# Patient Record
Sex: Female | Born: 1980 | Race: Black or African American | Hispanic: No | Marital: Single | Smoking: Former smoker
Health system: Southern US, Community
[De-identification: ages and names within clinical notes are randomized; demographics above are authoritative.]

## PROBLEM LIST (undated history)

## (undated) ENCOUNTER — Emergency Department (HOSPITAL_COMMUNITY): Admission: EM | Payer: Medicaid Other | Source: Home / Self Care

## (undated) ENCOUNTER — Emergency Department (HOSPITAL_COMMUNITY): Admission: EM | Disposition: A | Discharge status: Home / Self Care | Payer: Medicaid Other

## (undated) DIAGNOSIS — Z9049 Acquired absence of other specified parts of digestive tract: Secondary | ICD-10-CM

## (undated) DIAGNOSIS — K859 Acute pancreatitis without necrosis or infection, unspecified: Secondary | ICD-10-CM

## (undated) DIAGNOSIS — D649 Anemia, unspecified: Secondary | ICD-10-CM

## (undated) DIAGNOSIS — G8929 Other chronic pain: Secondary | ICD-10-CM

## (undated) DIAGNOSIS — N83209 Unspecified ovarian cyst, unspecified side: Secondary | ICD-10-CM

## (undated) DIAGNOSIS — D219 Benign neoplasm of connective and other soft tissue, unspecified: Secondary | ICD-10-CM

## (undated) DIAGNOSIS — R109 Unspecified abdominal pain: Secondary | ICD-10-CM

## (undated) HISTORY — DX: Unspecified ovarian cyst, unspecified side: N83.209

## (undated) HISTORY — PX: CHOLECYSTECTOMY: SHX55

## (undated) HISTORY — PX: DILATION AND CURETTAGE OF UTERUS: SHX78

## (undated) HISTORY — PX: ERCP W/ METAL STENT PLACEMENT: SHX1521

## (undated) HISTORY — PX: OOPHORECTOMY: SHX86

---
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
## 2002-02-05 ENCOUNTER — Encounter: Admission: RE | Admit: 2002-02-05 | Discharge: 2002-02-05 | Payer: Self-pay | Admitting: Family Medicine

## 2002-02-09 ENCOUNTER — Encounter: Admission: RE | Admit: 2002-02-09 | Discharge: 2002-02-09 | Payer: Self-pay | Admitting: Family Medicine

## 2002-02-09 ENCOUNTER — Other Ambulatory Visit: Admission: RE | Admit: 2002-02-09 | Discharge: 2002-02-09 | Payer: Self-pay | Admitting: Family Medicine

## 2002-03-22 ENCOUNTER — Encounter: Payer: Self-pay | Admitting: *Deleted

## 2002-03-22 ENCOUNTER — Inpatient Hospital Stay (HOSPITAL_COMMUNITY): Admission: AD | Admit: 2002-03-22 | Discharge: 2002-03-22 | Payer: Self-pay | Admitting: Cardiology

## 2002-05-08 ENCOUNTER — Encounter: Admission: RE | Admit: 2002-05-08 | Discharge: 2002-05-08 | Payer: Self-pay | Admitting: Family Medicine

## 2002-06-02 ENCOUNTER — Encounter: Payer: Self-pay | Admitting: Emergency Medicine

## 2002-06-02 ENCOUNTER — Emergency Department (HOSPITAL_COMMUNITY): Admission: EM | Admit: 2002-06-02 | Discharge: 2002-06-02 | Payer: Self-pay | Admitting: Emergency Medicine

## 2002-06-24 ENCOUNTER — Inpatient Hospital Stay (HOSPITAL_COMMUNITY): Admission: AD | Admit: 2002-06-24 | Discharge: 2002-06-24 | Payer: Self-pay | Admitting: *Deleted

## 2002-07-09 ENCOUNTER — Encounter: Admission: RE | Admit: 2002-07-09 | Discharge: 2002-07-09 | Payer: Self-pay | Admitting: Family Medicine

## 2002-07-24 ENCOUNTER — Encounter: Admission: RE | Admit: 2002-07-24 | Discharge: 2002-07-24 | Payer: Self-pay | Admitting: Family Medicine

## 2002-07-25 ENCOUNTER — Inpatient Hospital Stay (HOSPITAL_COMMUNITY): Admission: AD | Admit: 2002-07-25 | Discharge: 2002-07-25 | Payer: Self-pay | Admitting: Obstetrics and Gynecology

## 2002-07-26 ENCOUNTER — Encounter: Payer: Self-pay | Admitting: Obstetrics and Gynecology

## 2002-09-05 ENCOUNTER — Inpatient Hospital Stay (HOSPITAL_COMMUNITY): Admission: AD | Admit: 2002-09-05 | Discharge: 2002-09-05 | Payer: Self-pay | Admitting: Family Medicine

## 2002-10-03 ENCOUNTER — Encounter: Admission: RE | Admit: 2002-10-03 | Discharge: 2002-10-03 | Payer: Self-pay | Admitting: Family Medicine

## 2003-01-13 ENCOUNTER — Observation Stay (HOSPITAL_COMMUNITY): Admission: AD | Admit: 2003-01-13 | Discharge: 2003-01-14 | Payer: Self-pay | Admitting: Obstetrics and Gynecology

## 2003-01-14 ENCOUNTER — Encounter: Payer: Self-pay | Admitting: Obstetrics and Gynecology

## 2003-02-09 ENCOUNTER — Inpatient Hospital Stay (HOSPITAL_COMMUNITY): Admission: AD | Admit: 2003-02-09 | Discharge: 2003-02-10 | Payer: Self-pay | Admitting: *Deleted

## 2003-02-19 ENCOUNTER — Inpatient Hospital Stay (HOSPITAL_COMMUNITY): Admission: AD | Admit: 2003-02-19 | Discharge: 2003-02-20 | Payer: Self-pay | Admitting: *Deleted

## 2003-02-19 ENCOUNTER — Encounter: Admission: RE | Admit: 2003-02-19 | Discharge: 2003-02-19 | Payer: Self-pay | Admitting: Family Medicine

## 2003-03-13 ENCOUNTER — Encounter: Admission: RE | Admit: 2003-03-13 | Discharge: 2003-03-13 | Payer: Self-pay | Admitting: Family Medicine

## 2003-03-13 ENCOUNTER — Ambulatory Visit (HOSPITAL_COMMUNITY): Admission: RE | Admit: 2003-03-13 | Discharge: 2003-03-13 | Payer: Self-pay | Admitting: Family Medicine

## 2003-03-13 ENCOUNTER — Encounter: Payer: Self-pay | Admitting: Family Medicine

## 2003-03-23 ENCOUNTER — Inpatient Hospital Stay (HOSPITAL_COMMUNITY): Admission: AD | Admit: 2003-03-23 | Discharge: 2003-03-24 | Payer: Self-pay | Admitting: Obstetrics & Gynecology

## 2003-03-27 ENCOUNTER — Encounter: Admission: RE | Admit: 2003-03-27 | Discharge: 2003-03-27 | Payer: Self-pay | Admitting: Family Medicine

## 2003-03-30 ENCOUNTER — Inpatient Hospital Stay (HOSPITAL_COMMUNITY): Admission: AD | Admit: 2003-03-30 | Discharge: 2003-03-31 | Payer: Self-pay | Admitting: Obstetrics & Gynecology

## 2003-04-03 ENCOUNTER — Other Ambulatory Visit: Admission: RE | Admit: 2003-04-03 | Discharge: 2003-04-03 | Payer: Self-pay | Admitting: Family Medicine

## 2003-04-03 ENCOUNTER — Encounter (INDEPENDENT_AMBULATORY_CARE_PROVIDER_SITE_OTHER): Payer: Self-pay | Admitting: *Deleted

## 2003-04-03 ENCOUNTER — Encounter: Admission: RE | Admit: 2003-04-03 | Discharge: 2003-04-03 | Payer: Self-pay | Admitting: Family Medicine

## 2003-04-11 ENCOUNTER — Encounter: Payer: Self-pay | Admitting: Obstetrics & Gynecology

## 2003-04-11 ENCOUNTER — Inpatient Hospital Stay (HOSPITAL_COMMUNITY): Admission: AD | Admit: 2003-04-11 | Discharge: 2003-04-11 | Payer: Self-pay | Admitting: Obstetrics & Gynecology

## 2003-04-14 ENCOUNTER — Inpatient Hospital Stay (HOSPITAL_COMMUNITY): Admission: AD | Admit: 2003-04-14 | Discharge: 2003-04-15 | Payer: Self-pay | Admitting: Family Medicine

## 2003-04-22 ENCOUNTER — Inpatient Hospital Stay (HOSPITAL_COMMUNITY): Admission: AD | Admit: 2003-04-22 | Discharge: 2003-04-23 | Payer: Self-pay | Admitting: Obstetrics & Gynecology

## 2003-04-29 ENCOUNTER — Inpatient Hospital Stay (HOSPITAL_COMMUNITY): Admission: AD | Admit: 2003-04-29 | Discharge: 2003-04-29 | Payer: Self-pay | Admitting: Family Medicine

## 2003-04-30 ENCOUNTER — Encounter: Admission: RE | Admit: 2003-04-30 | Discharge: 2003-04-30 | Payer: Self-pay | Admitting: Family Medicine

## 2003-05-07 ENCOUNTER — Encounter: Admission: RE | Admit: 2003-05-07 | Discharge: 2003-05-07 | Payer: Self-pay | Admitting: Sports Medicine

## 2003-05-08 ENCOUNTER — Inpatient Hospital Stay (HOSPITAL_COMMUNITY): Admission: AD | Admit: 2003-05-08 | Discharge: 2003-05-08 | Payer: Self-pay | Admitting: Obstetrics and Gynecology

## 2003-05-15 ENCOUNTER — Encounter: Admission: RE | Admit: 2003-05-15 | Discharge: 2003-05-15 | Payer: Self-pay | Admitting: Family Medicine

## 2003-05-20 ENCOUNTER — Inpatient Hospital Stay (HOSPITAL_COMMUNITY): Admission: AD | Admit: 2003-05-20 | Discharge: 2003-05-21 | Payer: Self-pay | Admitting: Obstetrics & Gynecology

## 2003-05-23 ENCOUNTER — Encounter: Admission: RE | Admit: 2003-05-23 | Discharge: 2003-05-23 | Payer: Self-pay | Admitting: Family Medicine

## 2003-06-01 ENCOUNTER — Inpatient Hospital Stay (HOSPITAL_COMMUNITY): Admission: AD | Admit: 2003-06-01 | Discharge: 2003-06-01 | Payer: Self-pay | Admitting: Family Medicine

## 2003-06-05 ENCOUNTER — Encounter: Admission: RE | Admit: 2003-06-05 | Discharge: 2003-06-05 | Payer: Self-pay | Admitting: Family Medicine

## 2003-06-11 ENCOUNTER — Inpatient Hospital Stay (HOSPITAL_COMMUNITY): Admission: AD | Admit: 2003-06-11 | Discharge: 2003-06-11 | Payer: Self-pay | Admitting: *Deleted

## 2003-06-11 ENCOUNTER — Encounter: Admission: RE | Admit: 2003-06-11 | Discharge: 2003-06-11 | Payer: Self-pay | Admitting: Family Medicine

## 2003-06-12 ENCOUNTER — Inpatient Hospital Stay (HOSPITAL_COMMUNITY): Admission: AD | Admit: 2003-06-12 | Discharge: 2003-06-12 | Payer: Self-pay | Admitting: *Deleted

## 2003-06-14 ENCOUNTER — Encounter: Admission: RE | Admit: 2003-06-14 | Discharge: 2003-06-14 | Payer: Self-pay | Admitting: Family Medicine

## 2003-06-16 ENCOUNTER — Inpatient Hospital Stay (HOSPITAL_COMMUNITY): Admission: AD | Admit: 2003-06-16 | Discharge: 2003-06-17 | Payer: Self-pay | Admitting: Family Medicine

## 2003-06-19 ENCOUNTER — Encounter: Admission: RE | Admit: 2003-06-19 | Discharge: 2003-06-19 | Payer: Self-pay | Admitting: Family Medicine

## 2003-06-24 ENCOUNTER — Inpatient Hospital Stay (HOSPITAL_COMMUNITY): Admission: AD | Admit: 2003-06-24 | Discharge: 2003-06-24 | Payer: Self-pay | Admitting: *Deleted

## 2003-07-02 ENCOUNTER — Inpatient Hospital Stay (HOSPITAL_COMMUNITY): Admission: AD | Admit: 2003-07-02 | Discharge: 2003-07-05 | Payer: Self-pay | Admitting: *Deleted

## 2003-07-06 ENCOUNTER — Emergency Department (HOSPITAL_COMMUNITY): Admission: EM | Admit: 2003-07-06 | Discharge: 2003-07-07 | Payer: Self-pay | Admitting: Emergency Medicine

## 2003-07-11 ENCOUNTER — Encounter: Admission: RE | Admit: 2003-07-11 | Discharge: 2003-07-11 | Payer: Self-pay | Admitting: Family Medicine

## 2003-08-16 ENCOUNTER — Encounter: Admission: RE | Admit: 2003-08-16 | Discharge: 2003-08-16 | Payer: Self-pay | Admitting: Family Medicine

## 2003-08-30 ENCOUNTER — Encounter: Admission: RE | Admit: 2003-08-30 | Discharge: 2003-08-30 | Payer: Self-pay | Admitting: Family Medicine

## 2003-09-25 ENCOUNTER — Encounter: Admission: RE | Admit: 2003-09-25 | Discharge: 2003-09-25 | Payer: Self-pay | Admitting: Family Medicine

## 2003-10-03 ENCOUNTER — Emergency Department (HOSPITAL_COMMUNITY): Admission: EM | Admit: 2003-10-03 | Discharge: 2003-10-03 | Payer: Self-pay | Admitting: Family Medicine

## 2003-10-04 ENCOUNTER — Encounter: Admission: RE | Admit: 2003-10-04 | Discharge: 2003-10-04 | Payer: Self-pay | Admitting: Family Medicine

## 2003-11-05 ENCOUNTER — Other Ambulatory Visit: Admission: RE | Admit: 2003-11-05 | Discharge: 2003-11-05 | Payer: Self-pay | Admitting: Family Medicine

## 2003-11-05 ENCOUNTER — Encounter: Admission: RE | Admit: 2003-11-05 | Discharge: 2003-11-05 | Payer: Self-pay | Admitting: Family Medicine

## 2003-11-05 ENCOUNTER — Encounter (INDEPENDENT_AMBULATORY_CARE_PROVIDER_SITE_OTHER): Payer: Self-pay | Admitting: *Deleted

## 2003-11-11 ENCOUNTER — Emergency Department (HOSPITAL_COMMUNITY): Admission: EM | Admit: 2003-11-11 | Discharge: 2003-11-12 | Payer: Self-pay | Admitting: Emergency Medicine

## 2003-11-21 ENCOUNTER — Encounter: Admission: RE | Admit: 2003-11-21 | Discharge: 2003-11-21 | Payer: Self-pay | Admitting: Sports Medicine

## 2003-11-23 ENCOUNTER — Emergency Department (HOSPITAL_COMMUNITY): Admission: EM | Admit: 2003-11-23 | Discharge: 2003-11-23 | Payer: Self-pay | Admitting: *Deleted

## 2003-12-20 ENCOUNTER — Encounter: Admission: RE | Admit: 2003-12-20 | Discharge: 2003-12-20 | Payer: Self-pay | Admitting: Family Medicine

## 2004-01-24 ENCOUNTER — Encounter: Admission: RE | Admit: 2004-01-24 | Discharge: 2004-01-24 | Payer: Self-pay | Admitting: Family Medicine

## 2004-03-29 ENCOUNTER — Emergency Department (HOSPITAL_COMMUNITY): Admission: EM | Admit: 2004-03-29 | Discharge: 2004-03-29 | Payer: Self-pay | Admitting: Emergency Medicine

## 2004-04-28 ENCOUNTER — Ambulatory Visit: Payer: Self-pay | Admitting: Family Medicine

## 2004-06-02 ENCOUNTER — Other Ambulatory Visit: Admission: RE | Admit: 2004-06-02 | Discharge: 2004-06-02 | Payer: Self-pay | Admitting: Family Medicine

## 2004-06-02 ENCOUNTER — Encounter (INDEPENDENT_AMBULATORY_CARE_PROVIDER_SITE_OTHER): Payer: Self-pay | Admitting: *Deleted

## 2004-06-02 ENCOUNTER — Ambulatory Visit: Payer: Self-pay | Admitting: Family Medicine

## 2004-07-21 ENCOUNTER — Ambulatory Visit: Payer: Self-pay | Admitting: Family Medicine

## 2004-08-03 ENCOUNTER — Inpatient Hospital Stay (HOSPITAL_COMMUNITY): Admission: AD | Admit: 2004-08-03 | Discharge: 2004-08-03 | Payer: Self-pay | Admitting: Obstetrics & Gynecology

## 2004-08-05 ENCOUNTER — Emergency Department (HOSPITAL_COMMUNITY): Admission: EM | Admit: 2004-08-05 | Discharge: 2004-08-05 | Payer: Self-pay | Admitting: Emergency Medicine

## 2004-08-10 ENCOUNTER — Ambulatory Visit: Payer: Self-pay | Admitting: Family Medicine

## 2004-08-16 ENCOUNTER — Emergency Department (HOSPITAL_COMMUNITY): Admission: EM | Admit: 2004-08-16 | Discharge: 2004-08-17 | Payer: Self-pay | Admitting: Emergency Medicine

## 2004-08-31 ENCOUNTER — Emergency Department (HOSPITAL_COMMUNITY): Admission: EM | Admit: 2004-08-31 | Discharge: 2004-08-31 | Payer: Self-pay | Admitting: Emergency Medicine

## 2004-09-13 ENCOUNTER — Emergency Department (HOSPITAL_COMMUNITY): Admission: EM | Admit: 2004-09-13 | Discharge: 2004-09-13 | Payer: Self-pay | Admitting: Emergency Medicine

## 2004-09-24 ENCOUNTER — Emergency Department (HOSPITAL_COMMUNITY): Admission: EM | Admit: 2004-09-24 | Discharge: 2004-09-24 | Payer: Self-pay | Admitting: Emergency Medicine

## 2004-11-10 ENCOUNTER — Ambulatory Visit: Payer: Self-pay | Admitting: Family Medicine

## 2004-12-25 ENCOUNTER — Inpatient Hospital Stay (HOSPITAL_COMMUNITY): Admission: AD | Admit: 2004-12-25 | Discharge: 2004-12-26 | Payer: Self-pay | Admitting: Obstetrics and Gynecology

## 2005-06-10 ENCOUNTER — Ambulatory Visit: Payer: Self-pay | Admitting: Family Medicine

## 2005-08-04 ENCOUNTER — Emergency Department (HOSPITAL_COMMUNITY): Admission: EM | Admit: 2005-08-04 | Discharge: 2005-08-04 | Payer: Self-pay | Admitting: Family Medicine

## 2005-08-10 ENCOUNTER — Emergency Department (HOSPITAL_COMMUNITY): Admission: EM | Admit: 2005-08-10 | Discharge: 2005-08-10 | Payer: Self-pay | Admitting: Emergency Medicine

## 2005-08-11 ENCOUNTER — Encounter (INDEPENDENT_AMBULATORY_CARE_PROVIDER_SITE_OTHER): Payer: Self-pay | Admitting: *Deleted

## 2005-08-11 LAB — CONVERTED CEMR LAB

## 2005-08-17 ENCOUNTER — Ambulatory Visit: Payer: Self-pay | Admitting: Sports Medicine

## 2005-08-18 ENCOUNTER — Ambulatory Visit: Payer: Self-pay | Admitting: Family Medicine

## 2005-08-20 ENCOUNTER — Ambulatory Visit: Payer: Self-pay | Admitting: Sports Medicine

## 2005-08-23 ENCOUNTER — Ambulatory Visit: Payer: Self-pay | Admitting: Family Medicine

## 2005-08-27 ENCOUNTER — Ambulatory Visit: Payer: Self-pay | Admitting: Family Medicine

## 2005-09-03 ENCOUNTER — Ambulatory Visit: Payer: Self-pay | Admitting: Family Medicine

## 2005-09-06 ENCOUNTER — Ambulatory Visit: Payer: Self-pay | Admitting: Family Medicine

## 2005-09-07 ENCOUNTER — Ambulatory Visit: Payer: Self-pay | Admitting: Family Medicine

## 2005-09-10 ENCOUNTER — Emergency Department (HOSPITAL_COMMUNITY): Admission: EM | Admit: 2005-09-10 | Discharge: 2005-09-10 | Payer: Self-pay | Admitting: Emergency Medicine

## 2005-09-15 ENCOUNTER — Inpatient Hospital Stay (HOSPITAL_COMMUNITY): Admission: AD | Admit: 2005-09-15 | Discharge: 2005-09-15 | Payer: Self-pay | Admitting: Gynecology

## 2005-09-23 ENCOUNTER — Ambulatory Visit: Payer: Self-pay | Admitting: Family Medicine

## 2005-09-28 ENCOUNTER — Inpatient Hospital Stay (HOSPITAL_COMMUNITY): Admission: AD | Admit: 2005-09-28 | Discharge: 2005-09-28 | Payer: Self-pay | Admitting: *Deleted

## 2005-10-25 ENCOUNTER — Ambulatory Visit: Payer: Self-pay | Admitting: Family Medicine

## 2005-11-03 ENCOUNTER — Encounter: Payer: Self-pay | Admitting: Emergency Medicine

## 2005-11-11 ENCOUNTER — Ambulatory Visit: Payer: Self-pay | Admitting: Family Medicine

## 2005-12-13 ENCOUNTER — Ambulatory Visit: Payer: Self-pay | Admitting: Sports Medicine

## 2005-12-14 ENCOUNTER — Inpatient Hospital Stay (HOSPITAL_COMMUNITY): Admission: AD | Admit: 2005-12-14 | Discharge: 2005-12-15 | Payer: Self-pay | Admitting: Obstetrics and Gynecology

## 2005-12-15 ENCOUNTER — Ambulatory Visit: Payer: Self-pay | Admitting: Family Medicine

## 2005-12-27 ENCOUNTER — Ambulatory Visit: Payer: Self-pay | Admitting: Family Medicine

## 2005-12-29 ENCOUNTER — Emergency Department (HOSPITAL_COMMUNITY): Admission: EM | Admit: 2005-12-29 | Discharge: 2005-12-29 | Payer: Self-pay | Admitting: Emergency Medicine

## 2006-01-11 ENCOUNTER — Emergency Department (HOSPITAL_COMMUNITY): Admission: EM | Admit: 2006-01-11 | Discharge: 2006-01-12 | Payer: Self-pay | Admitting: Emergency Medicine

## 2006-01-24 ENCOUNTER — Emergency Department (HOSPITAL_COMMUNITY): Admission: EM | Admit: 2006-01-24 | Discharge: 2006-01-24 | Payer: Self-pay | Admitting: Emergency Medicine

## 2006-02-08 ENCOUNTER — Inpatient Hospital Stay (HOSPITAL_COMMUNITY): Admission: AD | Admit: 2006-02-08 | Discharge: 2006-02-08 | Payer: Self-pay | Admitting: Obstetrics and Gynecology

## 2006-02-10 ENCOUNTER — Encounter (INDEPENDENT_AMBULATORY_CARE_PROVIDER_SITE_OTHER): Payer: Self-pay | Admitting: Specialist

## 2006-02-10 ENCOUNTER — Ambulatory Visit: Payer: Self-pay | Admitting: Obstetrics and Gynecology

## 2006-03-02 ENCOUNTER — Emergency Department (HOSPITAL_COMMUNITY): Admission: EM | Admit: 2006-03-02 | Discharge: 2006-03-02 | Payer: Self-pay | Admitting: Emergency Medicine

## 2006-03-11 ENCOUNTER — Ambulatory Visit: Payer: Self-pay | Admitting: Family Medicine

## 2006-04-27 ENCOUNTER — Inpatient Hospital Stay (HOSPITAL_COMMUNITY): Admission: AD | Admit: 2006-04-27 | Discharge: 2006-04-27 | Payer: Self-pay | Admitting: Obstetrics and Gynecology

## 2006-04-29 ENCOUNTER — Emergency Department (HOSPITAL_COMMUNITY): Admission: EM | Admit: 2006-04-29 | Discharge: 2006-04-29 | Payer: Self-pay | Admitting: Emergency Medicine

## 2006-04-30 ENCOUNTER — Emergency Department (HOSPITAL_COMMUNITY): Admission: EM | Admit: 2006-04-30 | Discharge: 2006-04-30 | Payer: Self-pay | Admitting: *Deleted

## 2006-05-28 ENCOUNTER — Emergency Department (HOSPITAL_COMMUNITY): Admission: EM | Admit: 2006-05-28 | Discharge: 2006-05-28 | Payer: Self-pay | Admitting: Emergency Medicine

## 2006-05-30 ENCOUNTER — Emergency Department (HOSPITAL_COMMUNITY): Admission: EM | Admit: 2006-05-30 | Discharge: 2006-05-30 | Payer: Self-pay | Admitting: Emergency Medicine

## 2006-05-31 ENCOUNTER — Emergency Department (HOSPITAL_COMMUNITY): Admission: EM | Admit: 2006-05-31 | Discharge: 2006-06-01 | Payer: Self-pay | Admitting: Emergency Medicine

## 2006-06-03 ENCOUNTER — Ambulatory Visit: Payer: Self-pay | Admitting: Family Medicine

## 2006-06-10 ENCOUNTER — Inpatient Hospital Stay (HOSPITAL_COMMUNITY): Admission: AD | Admit: 2006-06-10 | Discharge: 2006-06-10 | Payer: Self-pay | Admitting: Family Medicine

## 2006-06-12 ENCOUNTER — Emergency Department (HOSPITAL_COMMUNITY): Admission: EM | Admit: 2006-06-12 | Discharge: 2006-06-13 | Payer: Self-pay | Admitting: Emergency Medicine

## 2006-06-15 ENCOUNTER — Emergency Department (HOSPITAL_COMMUNITY): Admission: EM | Admit: 2006-06-15 | Discharge: 2006-06-16 | Payer: Self-pay | Admitting: Emergency Medicine

## 2006-06-23 ENCOUNTER — Emergency Department (HOSPITAL_COMMUNITY): Admission: EM | Admit: 2006-06-23 | Discharge: 2006-06-23 | Payer: Self-pay | Admitting: Emergency Medicine

## 2006-06-23 ENCOUNTER — Emergency Department (HOSPITAL_COMMUNITY): Admission: EM | Admit: 2006-06-23 | Discharge: 2006-06-24 | Payer: Self-pay | Admitting: Emergency Medicine

## 2006-06-30 ENCOUNTER — Ambulatory Visit: Payer: Self-pay | Admitting: Obstetrics and Gynecology

## 2006-07-01 ENCOUNTER — Emergency Department (HOSPITAL_COMMUNITY): Admission: EM | Admit: 2006-07-01 | Discharge: 2006-07-01 | Payer: Self-pay | Admitting: Emergency Medicine

## 2006-07-03 ENCOUNTER — Emergency Department (HOSPITAL_COMMUNITY): Admission: EM | Admit: 2006-07-03 | Discharge: 2006-07-04 | Payer: Self-pay | Admitting: Emergency Medicine

## 2006-07-06 ENCOUNTER — Ambulatory Visit: Payer: Self-pay | Admitting: Gynecology

## 2006-07-07 ENCOUNTER — Ambulatory Visit: Payer: Self-pay | Admitting: Obstetrics and Gynecology

## 2006-07-18 ENCOUNTER — Ambulatory Visit: Payer: Self-pay | Admitting: Obstetrics and Gynecology

## 2006-07-18 ENCOUNTER — Ambulatory Visit (HOSPITAL_COMMUNITY): Admission: RE | Admit: 2006-07-18 | Discharge: 2006-07-18 | Payer: Self-pay | Admitting: Obstetrics and Gynecology

## 2006-07-19 ENCOUNTER — Inpatient Hospital Stay (HOSPITAL_COMMUNITY): Admission: AD | Admit: 2006-07-19 | Discharge: 2006-07-19 | Payer: Self-pay | Admitting: Gynecology

## 2006-07-19 ENCOUNTER — Ambulatory Visit: Payer: Self-pay | Admitting: Obstetrics and Gynecology

## 2006-07-22 ENCOUNTER — Emergency Department (HOSPITAL_COMMUNITY): Admission: EM | Admit: 2006-07-22 | Discharge: 2006-07-23 | Payer: Self-pay | Admitting: Emergency Medicine

## 2006-07-27 ENCOUNTER — Emergency Department (HOSPITAL_COMMUNITY): Admission: EM | Admit: 2006-07-27 | Discharge: 2006-07-27 | Payer: Self-pay | Admitting: Emergency Medicine

## 2006-07-28 ENCOUNTER — Emergency Department (HOSPITAL_COMMUNITY): Admission: EM | Admit: 2006-07-28 | Discharge: 2006-07-29 | Payer: Self-pay | Admitting: Emergency Medicine

## 2006-08-02 ENCOUNTER — Inpatient Hospital Stay (HOSPITAL_COMMUNITY): Admission: AD | Admit: 2006-08-02 | Discharge: 2006-08-02 | Payer: Self-pay | Admitting: Family Medicine

## 2006-08-03 ENCOUNTER — Ambulatory Visit: Payer: Self-pay | Admitting: Obstetrics and Gynecology

## 2006-08-05 ENCOUNTER — Emergency Department (HOSPITAL_COMMUNITY): Admission: EM | Admit: 2006-08-05 | Discharge: 2006-08-06 | Payer: Self-pay | Admitting: Emergency Medicine

## 2006-08-07 ENCOUNTER — Emergency Department (HOSPITAL_COMMUNITY): Admission: EM | Admit: 2006-08-07 | Discharge: 2006-08-07 | Payer: Self-pay | Admitting: Emergency Medicine

## 2006-08-11 ENCOUNTER — Inpatient Hospital Stay (HOSPITAL_COMMUNITY): Admission: AD | Admit: 2006-08-11 | Discharge: 2006-08-11 | Payer: Self-pay | Admitting: Obstetrics and Gynecology

## 2006-08-17 ENCOUNTER — Ambulatory Visit: Payer: Self-pay | Admitting: Obstetrics and Gynecology

## 2006-08-29 ENCOUNTER — Ambulatory Visit: Payer: Self-pay | Admitting: Family Medicine

## 2006-08-31 ENCOUNTER — Ambulatory Visit: Payer: Self-pay | Admitting: Family Medicine

## 2006-09-01 DIAGNOSIS — J45909 Unspecified asthma, uncomplicated: Secondary | ICD-10-CM | POA: Insufficient documentation

## 2006-09-01 DIAGNOSIS — F339 Major depressive disorder, recurrent, unspecified: Secondary | ICD-10-CM | POA: Insufficient documentation

## 2006-09-01 DIAGNOSIS — J309 Allergic rhinitis, unspecified: Secondary | ICD-10-CM | POA: Insufficient documentation

## 2006-09-02 ENCOUNTER — Encounter (INDEPENDENT_AMBULATORY_CARE_PROVIDER_SITE_OTHER): Payer: Self-pay | Admitting: *Deleted

## 2006-09-07 ENCOUNTER — Ambulatory Visit: Payer: Self-pay | Admitting: Obstetrics & Gynecology

## 2006-09-12 ENCOUNTER — Emergency Department (HOSPITAL_COMMUNITY): Admission: EM | Admit: 2006-09-12 | Discharge: 2006-09-12 | Payer: Self-pay | Admitting: Emergency Medicine

## 2006-09-29 ENCOUNTER — Ambulatory Visit: Payer: Self-pay | Admitting: *Deleted

## 2006-10-04 ENCOUNTER — Encounter (INDEPENDENT_AMBULATORY_CARE_PROVIDER_SITE_OTHER): Payer: Self-pay | Admitting: *Deleted

## 2006-10-04 ENCOUNTER — Ambulatory Visit: Payer: Self-pay | Admitting: Obstetrics & Gynecology

## 2006-10-04 ENCOUNTER — Ambulatory Visit (HOSPITAL_COMMUNITY): Admission: RE | Admit: 2006-10-04 | Discharge: 2006-10-05 | Payer: Self-pay | Admitting: Obstetrics & Gynecology

## 2006-10-07 ENCOUNTER — Observation Stay (HOSPITAL_COMMUNITY): Admission: AD | Admit: 2006-10-07 | Discharge: 2006-10-08 | Payer: Self-pay | Admitting: Obstetrics and Gynecology

## 2006-10-07 ENCOUNTER — Ambulatory Visit: Payer: Self-pay | Admitting: Obstetrics and Gynecology

## 2006-10-10 ENCOUNTER — Inpatient Hospital Stay (HOSPITAL_COMMUNITY): Admission: AD | Admit: 2006-10-10 | Discharge: 2006-10-11 | Payer: Self-pay | Admitting: Obstetrics & Gynecology

## 2006-10-10 ENCOUNTER — Ambulatory Visit: Payer: Self-pay | Admitting: Obstetrics & Gynecology

## 2006-10-13 ENCOUNTER — Ambulatory Visit: Payer: Self-pay | Admitting: Obstetrics & Gynecology

## 2006-10-26 ENCOUNTER — Telehealth: Payer: Self-pay | Admitting: *Deleted

## 2006-11-10 ENCOUNTER — Inpatient Hospital Stay (HOSPITAL_COMMUNITY): Admission: AD | Admit: 2006-11-10 | Discharge: 2006-11-10 | Payer: Self-pay | Admitting: Obstetrics and Gynecology

## 2006-11-29 ENCOUNTER — Emergency Department (HOSPITAL_COMMUNITY): Admission: EM | Admit: 2006-11-29 | Discharge: 2006-11-29 | Payer: Self-pay | Admitting: Emergency Medicine

## 2006-12-01 ENCOUNTER — Telehealth (INDEPENDENT_AMBULATORY_CARE_PROVIDER_SITE_OTHER): Payer: Self-pay | Admitting: Family Medicine

## 2006-12-01 ENCOUNTER — Emergency Department (HOSPITAL_COMMUNITY): Admission: EM | Admit: 2006-12-01 | Discharge: 2006-12-01 | Payer: Self-pay | Admitting: Emergency Medicine

## 2007-01-18 ENCOUNTER — Emergency Department (HOSPITAL_COMMUNITY): Admission: EM | Admit: 2007-01-18 | Discharge: 2007-01-19 | Payer: Self-pay | Admitting: Emergency Medicine

## 2007-01-22 ENCOUNTER — Emergency Department (HOSPITAL_COMMUNITY): Admission: EM | Admit: 2007-01-22 | Discharge: 2007-01-23 | Payer: Self-pay | Admitting: *Deleted

## 2007-02-02 ENCOUNTER — Other Ambulatory Visit: Payer: Self-pay

## 2007-02-03 ENCOUNTER — Inpatient Hospital Stay (HOSPITAL_COMMUNITY): Admission: AD | Admit: 2007-02-03 | Discharge: 2007-02-03 | Payer: Self-pay | Admitting: Family Medicine

## 2007-02-05 ENCOUNTER — Emergency Department (HOSPITAL_COMMUNITY): Admission: EM | Admit: 2007-02-05 | Discharge: 2007-02-05 | Payer: Self-pay | Admitting: Emergency Medicine

## 2007-02-07 ENCOUNTER — Emergency Department (HOSPITAL_COMMUNITY): Admission: EM | Admit: 2007-02-07 | Discharge: 2007-02-07 | Payer: Self-pay | Admitting: Emergency Medicine

## 2007-02-08 ENCOUNTER — Emergency Department (HOSPITAL_COMMUNITY): Admission: EM | Admit: 2007-02-08 | Discharge: 2007-02-08 | Payer: Self-pay | Admitting: Emergency Medicine

## 2007-02-12 ENCOUNTER — Emergency Department (HOSPITAL_COMMUNITY): Admission: EM | Admit: 2007-02-12 | Discharge: 2007-02-12 | Payer: Self-pay | Admitting: Emergency Medicine

## 2007-02-13 ENCOUNTER — Inpatient Hospital Stay (HOSPITAL_COMMUNITY): Admission: AD | Admit: 2007-02-13 | Discharge: 2007-02-13 | Payer: Self-pay | Admitting: Obstetrics & Gynecology

## 2007-02-14 ENCOUNTER — Emergency Department (HOSPITAL_COMMUNITY): Admission: EM | Admit: 2007-02-14 | Discharge: 2007-02-14 | Payer: Self-pay | Admitting: Emergency Medicine

## 2007-02-14 ENCOUNTER — Ambulatory Visit: Payer: Self-pay | Admitting: Family Medicine

## 2007-02-14 DIAGNOSIS — R1011 Right upper quadrant pain: Secondary | ICD-10-CM | POA: Insufficient documentation

## 2007-02-14 DIAGNOSIS — Z9079 Acquired absence of other genital organ(s): Secondary | ICD-10-CM | POA: Insufficient documentation

## 2007-02-15 ENCOUNTER — Telehealth: Payer: Self-pay | Admitting: *Deleted

## 2007-02-15 ENCOUNTER — Encounter: Payer: Self-pay | Admitting: Family Medicine

## 2007-02-15 ENCOUNTER — Emergency Department (HOSPITAL_COMMUNITY): Admission: EM | Admit: 2007-02-15 | Discharge: 2007-02-15 | Payer: Self-pay | Admitting: Emergency Medicine

## 2007-02-16 ENCOUNTER — Telehealth: Payer: Self-pay | Admitting: Family Medicine

## 2007-02-16 ENCOUNTER — Encounter: Payer: Self-pay | Admitting: Family Medicine

## 2007-02-21 ENCOUNTER — Ambulatory Visit (HOSPITAL_COMMUNITY): Admission: RE | Admit: 2007-02-21 | Discharge: 2007-02-21 | Payer: Self-pay | Admitting: Gastroenterology

## 2007-02-22 ENCOUNTER — Emergency Department (HOSPITAL_COMMUNITY): Admission: EM | Admit: 2007-02-22 | Discharge: 2007-02-22 | Payer: Self-pay | Admitting: Emergency Medicine

## 2007-02-23 ENCOUNTER — Encounter: Payer: Self-pay | Admitting: Family Medicine

## 2007-02-24 ENCOUNTER — Encounter: Payer: Self-pay | Admitting: Family Medicine

## 2007-02-26 ENCOUNTER — Emergency Department (HOSPITAL_COMMUNITY): Admission: EM | Admit: 2007-02-26 | Discharge: 2007-02-26 | Payer: Self-pay | Admitting: Emergency Medicine

## 2007-03-09 ENCOUNTER — Emergency Department (HOSPITAL_COMMUNITY): Admission: EM | Admit: 2007-03-09 | Discharge: 2007-03-09 | Payer: Self-pay | Admitting: Emergency Medicine

## 2007-03-14 ENCOUNTER — Telehealth: Payer: Self-pay | Admitting: *Deleted

## 2007-03-27 ENCOUNTER — Emergency Department (HOSPITAL_COMMUNITY): Admission: EM | Admit: 2007-03-27 | Discharge: 2007-03-28 | Payer: Self-pay | Admitting: Emergency Medicine

## 2007-04-06 ENCOUNTER — Ambulatory Visit (HOSPITAL_COMMUNITY): Admission: RE | Admit: 2007-04-06 | Discharge: 2007-04-06 | Payer: Self-pay | Admitting: General Surgery

## 2007-04-10 ENCOUNTER — Inpatient Hospital Stay (HOSPITAL_COMMUNITY): Admission: EM | Admit: 2007-04-10 | Discharge: 2007-04-11 | Payer: Self-pay | Admitting: Emergency Medicine

## 2007-04-10 ENCOUNTER — Ambulatory Visit: Payer: Self-pay | Admitting: Internal Medicine

## 2007-04-11 ENCOUNTER — Telehealth: Payer: Self-pay | Admitting: Family Medicine

## 2007-04-11 ENCOUNTER — Encounter (INDEPENDENT_AMBULATORY_CARE_PROVIDER_SITE_OTHER): Payer: Self-pay | Admitting: General Surgery

## 2007-04-13 ENCOUNTER — Encounter: Payer: Self-pay | Admitting: Family Medicine

## 2007-04-13 DIAGNOSIS — Z9089 Acquired absence of other organs: Secondary | ICD-10-CM | POA: Insufficient documentation

## 2007-04-14 ENCOUNTER — Telehealth: Payer: Self-pay | Admitting: *Deleted

## 2007-04-24 ENCOUNTER — Ambulatory Visit: Payer: Self-pay | Admitting: Family Medicine

## 2007-05-03 ENCOUNTER — Ambulatory Visit: Payer: Self-pay | Admitting: Family Medicine

## 2007-05-11 ENCOUNTER — Telehealth: Payer: Self-pay | Admitting: Family Medicine

## 2007-05-15 ENCOUNTER — Telehealth: Payer: Self-pay | Admitting: Family Medicine

## 2007-05-15 ENCOUNTER — Emergency Department (HOSPITAL_COMMUNITY): Admission: EM | Admit: 2007-05-15 | Discharge: 2007-05-15 | Payer: Self-pay | Admitting: Emergency Medicine

## 2007-05-17 ENCOUNTER — Emergency Department (HOSPITAL_COMMUNITY): Admission: EM | Admit: 2007-05-17 | Discharge: 2007-05-17 | Payer: Self-pay | Admitting: Emergency Medicine

## 2007-05-22 ENCOUNTER — Encounter: Payer: Self-pay | Admitting: *Deleted

## 2007-05-24 ENCOUNTER — Emergency Department (HOSPITAL_COMMUNITY): Admission: EM | Admit: 2007-05-24 | Discharge: 2007-05-25 | Payer: Self-pay | Admitting: Emergency Medicine

## 2007-06-14 ENCOUNTER — Observation Stay: Payer: Self-pay | Admitting: Internal Medicine

## 2007-06-15 ENCOUNTER — Emergency Department: Payer: Self-pay | Admitting: Emergency Medicine

## 2007-06-30 ENCOUNTER — Emergency Department: Payer: Self-pay | Admitting: Emergency Medicine

## 2007-07-01 ENCOUNTER — Observation Stay: Payer: Self-pay | Admitting: Internal Medicine

## 2007-07-03 ENCOUNTER — Emergency Department (HOSPITAL_COMMUNITY): Admission: EM | Admit: 2007-07-03 | Discharge: 2007-07-04 | Payer: Self-pay | Admitting: Emergency Medicine

## 2007-07-08 ENCOUNTER — Emergency Department (HOSPITAL_COMMUNITY): Admission: EM | Admit: 2007-07-08 | Discharge: 2007-07-08 | Payer: Self-pay | Admitting: Emergency Medicine

## 2007-07-10 ENCOUNTER — Emergency Department (HOSPITAL_COMMUNITY): Admission: EM | Admit: 2007-07-10 | Discharge: 2007-07-10 | Payer: Self-pay | Admitting: Emergency Medicine

## 2007-07-23 ENCOUNTER — Emergency Department: Payer: Self-pay | Admitting: Emergency Medicine

## 2007-08-23 ENCOUNTER — Emergency Department: Payer: Self-pay | Admitting: Emergency Medicine

## 2007-08-24 ENCOUNTER — Emergency Department: Payer: Self-pay | Admitting: Emergency Medicine

## 2007-08-29 ENCOUNTER — Emergency Department: Payer: Self-pay | Admitting: Emergency Medicine

## 2007-09-25 ENCOUNTER — Telehealth: Payer: Self-pay | Admitting: *Deleted

## 2007-09-26 ENCOUNTER — Ambulatory Visit: Payer: Self-pay | Admitting: Family Medicine

## 2007-09-26 DIAGNOSIS — K861 Other chronic pancreatitis: Secondary | ICD-10-CM | POA: Insufficient documentation

## 2007-09-27 ENCOUNTER — Emergency Department: Payer: Self-pay | Admitting: Emergency Medicine

## 2007-09-28 ENCOUNTER — Emergency Department: Payer: Self-pay | Admitting: Emergency Medicine

## 2007-09-28 ENCOUNTER — Telehealth: Payer: Self-pay | Admitting: Family Medicine

## 2007-09-29 ENCOUNTER — Encounter: Payer: Self-pay | Admitting: Family Medicine

## 2007-09-29 ENCOUNTER — Inpatient Hospital Stay (HOSPITAL_COMMUNITY): Admission: AD | Admit: 2007-09-29 | Discharge: 2007-09-30 | Payer: Self-pay | Admitting: Family Medicine

## 2007-09-29 ENCOUNTER — Ambulatory Visit: Payer: Self-pay | Admitting: Family Medicine

## 2007-09-29 ENCOUNTER — Telehealth: Payer: Self-pay | Admitting: Family Medicine

## 2007-09-29 DIAGNOSIS — R1110 Vomiting, unspecified: Secondary | ICD-10-CM

## 2007-09-29 LAB — CONVERTED CEMR LAB
Bilirubin Urine: NEGATIVE
Ketones, urine, test strip: NEGATIVE
Specific Gravity, Urine: 1.02

## 2007-10-03 ENCOUNTER — Telehealth: Payer: Self-pay | Admitting: Family Medicine

## 2007-10-05 ENCOUNTER — Encounter: Payer: Self-pay | Admitting: Family Medicine

## 2007-10-05 LAB — CONVERTED CEMR LAB
AST: 39 units/L
Albumin: 4.6 g/dL
Alkaline Phosphatase: 116 units/L
Total Bilirubin: 0.3 mg/dL
Total Protein: 7.7 g/dL

## 2007-10-08 ENCOUNTER — Observation Stay (HOSPITAL_COMMUNITY): Admission: EM | Admit: 2007-10-08 | Discharge: 2007-10-11 | Payer: Self-pay | Admitting: Emergency Medicine

## 2007-10-08 ENCOUNTER — Encounter: Payer: Self-pay | Admitting: Family Medicine

## 2007-10-08 ENCOUNTER — Ambulatory Visit: Payer: Self-pay | Admitting: Family Medicine

## 2007-10-08 DIAGNOSIS — R109 Unspecified abdominal pain: Secondary | ICD-10-CM | POA: Insufficient documentation

## 2007-10-10 ENCOUNTER — Telehealth: Payer: Self-pay | Admitting: Family Medicine

## 2007-10-11 ENCOUNTER — Encounter (INDEPENDENT_AMBULATORY_CARE_PROVIDER_SITE_OTHER): Payer: Self-pay | Admitting: Gastroenterology

## 2007-10-16 ENCOUNTER — Encounter: Payer: Self-pay | Admitting: Family Medicine

## 2007-10-16 ENCOUNTER — Ambulatory Visit: Payer: Self-pay | Admitting: Sports Medicine

## 2007-10-16 DIAGNOSIS — G4700 Insomnia, unspecified: Secondary | ICD-10-CM | POA: Insufficient documentation

## 2007-10-19 ENCOUNTER — Inpatient Hospital Stay (HOSPITAL_COMMUNITY): Admission: EM | Admit: 2007-10-19 | Discharge: 2007-10-22 | Payer: Self-pay | Admitting: Emergency Medicine

## 2007-10-19 ENCOUNTER — Ambulatory Visit: Payer: Self-pay | Admitting: Family Medicine

## 2007-10-19 ENCOUNTER — Encounter: Payer: Self-pay | Admitting: Family Medicine

## 2007-10-23 ENCOUNTER — Encounter: Payer: Self-pay | Admitting: Family Medicine

## 2007-10-25 ENCOUNTER — Emergency Department (HOSPITAL_COMMUNITY): Admission: EM | Admit: 2007-10-25 | Discharge: 2007-10-26 | Payer: Self-pay | Admitting: Emergency Medicine

## 2007-10-25 ENCOUNTER — Telehealth: Payer: Self-pay | Admitting: Family Medicine

## 2007-10-26 ENCOUNTER — Encounter: Payer: Self-pay | Admitting: Family Medicine

## 2007-10-26 ENCOUNTER — Telehealth: Payer: Self-pay | Admitting: Family Medicine

## 2007-10-26 ENCOUNTER — Ambulatory Visit: Payer: Self-pay | Admitting: Family Medicine

## 2007-10-26 DIAGNOSIS — F6812 Factitious disorder with predominantly physical signs and symptoms: Secondary | ICD-10-CM

## 2007-10-26 LAB — CONVERTED CEMR LAB
ALT: 65 units/L
BUN: 11 mg/dL
CO2: 21 meq/L
Creatinine, Ser: 0.84 mg/dL
Eosinophils Relative: 5 %
HCT: 40 %
Hemoglobin: 13.1 g/dL
Lipase: 173 units/L
Lymphocytes Relative: 27 %
Lymphs Abs: 1.8 10*3/uL
Monocytes Absolute: 0.6 10*3/uL
Monocytes Relative: 9 %
RBC: 4.95 M/uL
RDW: 12 %
Total Bilirubin: 0.3 mg/dL
WBC: 6.7 10*3/uL

## 2007-10-27 ENCOUNTER — Encounter: Payer: Self-pay | Admitting: Family Medicine

## 2007-10-31 ENCOUNTER — Emergency Department (HOSPITAL_COMMUNITY): Admission: EM | Admit: 2007-10-31 | Discharge: 2007-11-01 | Payer: Self-pay | Admitting: Emergency Medicine

## 2007-11-04 ENCOUNTER — Emergency Department (HOSPITAL_COMMUNITY): Admission: EM | Admit: 2007-11-04 | Discharge: 2007-11-04 | Payer: Self-pay | Admitting: Emergency Medicine

## 2007-11-08 ENCOUNTER — Emergency Department (HOSPITAL_COMMUNITY): Admission: EM | Admit: 2007-11-08 | Discharge: 2007-11-08 | Payer: Self-pay | Admitting: Emergency Medicine

## 2007-11-10 ENCOUNTER — Ambulatory Visit: Payer: Self-pay | Admitting: Family Medicine

## 2007-11-10 ENCOUNTER — Emergency Department (HOSPITAL_COMMUNITY): Admission: EM | Admit: 2007-11-10 | Discharge: 2007-11-10 | Payer: Self-pay | Admitting: Emergency Medicine

## 2007-11-21 ENCOUNTER — Encounter: Payer: Self-pay | Admitting: Family Medicine

## 2007-11-27 ENCOUNTER — Emergency Department (HOSPITAL_COMMUNITY): Admission: EM | Admit: 2007-11-27 | Discharge: 2007-11-28 | Payer: Self-pay | Admitting: Emergency Medicine

## 2007-11-30 ENCOUNTER — Emergency Department (HOSPITAL_COMMUNITY): Admission: EM | Admit: 2007-11-30 | Discharge: 2007-11-30 | Payer: Self-pay | Admitting: Emergency Medicine

## 2007-12-07 ENCOUNTER — Emergency Department (HOSPITAL_COMMUNITY): Admission: EM | Admit: 2007-12-07 | Discharge: 2007-12-07 | Payer: Self-pay | Admitting: Emergency Medicine

## 2007-12-13 ENCOUNTER — Emergency Department (HOSPITAL_COMMUNITY): Admission: EM | Admit: 2007-12-13 | Discharge: 2007-12-13 | Payer: Self-pay | Admitting: Emergency Medicine

## 2007-12-14 ENCOUNTER — Emergency Department (HOSPITAL_COMMUNITY): Admission: EM | Admit: 2007-12-14 | Discharge: 2007-12-15 | Payer: Self-pay | Admitting: Emergency Medicine

## 2007-12-20 ENCOUNTER — Emergency Department (HOSPITAL_COMMUNITY): Admission: EM | Admit: 2007-12-20 | Discharge: 2007-12-20 | Payer: Self-pay | Admitting: Emergency Medicine

## 2007-12-29 ENCOUNTER — Emergency Department (HOSPITAL_COMMUNITY): Admission: EM | Admit: 2007-12-29 | Discharge: 2007-12-29 | Payer: Self-pay | Admitting: Emergency Medicine

## 2008-01-11 ENCOUNTER — Emergency Department (HOSPITAL_COMMUNITY): Admission: EM | Admit: 2008-01-11 | Discharge: 2008-01-12 | Payer: Self-pay | Admitting: Emergency Medicine

## 2008-01-14 ENCOUNTER — Emergency Department (HOSPITAL_COMMUNITY): Admission: EM | Admit: 2008-01-14 | Discharge: 2008-01-15 | Payer: Self-pay | Admitting: Emergency Medicine

## 2008-01-23 ENCOUNTER — Emergency Department (HOSPITAL_COMMUNITY): Admission: EM | Admit: 2008-01-23 | Discharge: 2008-01-23 | Payer: Self-pay | Admitting: Emergency Medicine

## 2008-01-28 ENCOUNTER — Emergency Department (HOSPITAL_COMMUNITY): Admission: EM | Admit: 2008-01-28 | Discharge: 2008-01-29 | Payer: Self-pay | Admitting: Emergency Medicine

## 2008-02-07 ENCOUNTER — Emergency Department (HOSPITAL_COMMUNITY): Admission: EM | Admit: 2008-02-07 | Discharge: 2008-02-07 | Payer: Self-pay | Admitting: Emergency Medicine

## 2008-02-15 ENCOUNTER — Emergency Department (HOSPITAL_COMMUNITY): Admission: EM | Admit: 2008-02-15 | Discharge: 2008-02-15 | Payer: Self-pay | Admitting: Emergency Medicine

## 2008-02-22 ENCOUNTER — Encounter: Payer: Self-pay | Admitting: *Deleted

## 2008-02-29 ENCOUNTER — Emergency Department (HOSPITAL_COMMUNITY): Admission: EM | Admit: 2008-02-29 | Discharge: 2008-03-01 | Payer: Self-pay | Admitting: Emergency Medicine

## 2008-03-09 ENCOUNTER — Emergency Department (HOSPITAL_COMMUNITY): Admission: EM | Admit: 2008-03-09 | Discharge: 2008-03-10 | Payer: Self-pay | Admitting: Emergency Medicine

## 2008-03-10 ENCOUNTER — Emergency Department (HOSPITAL_COMMUNITY): Admission: EM | Admit: 2008-03-10 | Discharge: 2008-03-11 | Payer: Self-pay | Admitting: Ophthalmology

## 2008-03-22 ENCOUNTER — Emergency Department (HOSPITAL_COMMUNITY): Admission: EM | Admit: 2008-03-22 | Discharge: 2008-03-22 | Payer: Self-pay | Admitting: Emergency Medicine

## 2008-03-29 ENCOUNTER — Emergency Department (HOSPITAL_COMMUNITY): Admission: EM | Admit: 2008-03-29 | Discharge: 2008-03-30 | Payer: Self-pay | Admitting: Emergency Medicine

## 2008-03-31 ENCOUNTER — Emergency Department (HOSPITAL_COMMUNITY): Admission: EM | Admit: 2008-03-31 | Discharge: 2008-03-31 | Payer: Self-pay | Admitting: Emergency Medicine

## 2008-04-17 ENCOUNTER — Emergency Department (HOSPITAL_COMMUNITY): Admission: EM | Admit: 2008-04-17 | Discharge: 2008-04-17 | Payer: Self-pay | Admitting: Emergency Medicine

## 2008-04-20 ENCOUNTER — Emergency Department (HOSPITAL_COMMUNITY): Admission: EM | Admit: 2008-04-20 | Discharge: 2008-04-20 | Payer: Self-pay | Admitting: Emergency Medicine

## 2008-04-25 ENCOUNTER — Emergency Department (HOSPITAL_COMMUNITY): Admission: EM | Admit: 2008-04-25 | Discharge: 2008-04-25 | Payer: Self-pay | Admitting: Emergency Medicine

## 2008-04-29 ENCOUNTER — Emergency Department (HOSPITAL_COMMUNITY): Admission: EM | Admit: 2008-04-29 | Discharge: 2008-04-29 | Payer: Self-pay | Admitting: Emergency Medicine

## 2008-05-09 ENCOUNTER — Emergency Department (HOSPITAL_COMMUNITY): Admission: EM | Admit: 2008-05-09 | Discharge: 2008-05-09 | Payer: Self-pay | Admitting: Emergency Medicine

## 2008-06-03 ENCOUNTER — Emergency Department (HOSPITAL_COMMUNITY): Admission: EM | Admit: 2008-06-03 | Discharge: 2008-06-03 | Payer: Self-pay | Admitting: Emergency Medicine

## 2008-06-18 ENCOUNTER — Emergency Department (HOSPITAL_COMMUNITY): Admission: EM | Admit: 2008-06-18 | Discharge: 2008-06-18 | Payer: Self-pay | Admitting: Emergency Medicine

## 2008-06-22 ENCOUNTER — Inpatient Hospital Stay (HOSPITAL_COMMUNITY): Admission: EM | Admit: 2008-06-22 | Discharge: 2008-06-26 | Payer: Self-pay | Admitting: Emergency Medicine

## 2008-07-01 ENCOUNTER — Inpatient Hospital Stay (HOSPITAL_COMMUNITY): Admission: EM | Admit: 2008-07-01 | Discharge: 2008-07-10 | Payer: Self-pay | Admitting: Emergency Medicine

## 2008-07-22 ENCOUNTER — Emergency Department (HOSPITAL_COMMUNITY): Admission: EM | Admit: 2008-07-22 | Discharge: 2008-07-23 | Payer: Self-pay | Admitting: Emergency Medicine

## 2008-07-24 ENCOUNTER — Inpatient Hospital Stay (HOSPITAL_COMMUNITY): Admission: EM | Admit: 2008-07-24 | Discharge: 2008-07-27 | Payer: Self-pay | Admitting: Emergency Medicine

## 2008-08-10 ENCOUNTER — Emergency Department (HOSPITAL_COMMUNITY): Admission: EM | Admit: 2008-08-10 | Discharge: 2008-08-10 | Payer: Self-pay | Admitting: Emergency Medicine

## 2008-08-12 ENCOUNTER — Emergency Department (HOSPITAL_COMMUNITY): Admission: EM | Admit: 2008-08-12 | Discharge: 2008-08-13 | Payer: Self-pay | Admitting: Emergency Medicine

## 2008-08-16 ENCOUNTER — Emergency Department (HOSPITAL_COMMUNITY): Admission: EM | Admit: 2008-08-16 | Discharge: 2008-08-16 | Payer: Self-pay | Admitting: Emergency Medicine

## 2008-09-04 ENCOUNTER — Emergency Department (HOSPITAL_COMMUNITY): Admission: EM | Admit: 2008-09-04 | Discharge: 2008-09-04 | Payer: Self-pay | Admitting: Emergency Medicine

## 2008-09-05 ENCOUNTER — Emergency Department (HOSPITAL_COMMUNITY): Admission: EM | Admit: 2008-09-05 | Discharge: 2008-09-06 | Payer: Self-pay | Admitting: Emergency Medicine

## 2008-09-22 ENCOUNTER — Emergency Department (HOSPITAL_COMMUNITY): Admission: EM | Admit: 2008-09-22 | Discharge: 2008-09-22 | Payer: Self-pay | Admitting: Emergency Medicine

## 2008-09-24 ENCOUNTER — Emergency Department (HOSPITAL_COMMUNITY): Admission: EM | Admit: 2008-09-24 | Discharge: 2008-09-24 | Payer: Self-pay | Admitting: Emergency Medicine

## 2008-10-04 ENCOUNTER — Emergency Department (HOSPITAL_COMMUNITY): Admission: EM | Admit: 2008-10-04 | Discharge: 2008-10-04 | Payer: Self-pay | Admitting: *Deleted

## 2008-10-06 ENCOUNTER — Emergency Department (HOSPITAL_COMMUNITY): Admission: EM | Admit: 2008-10-06 | Discharge: 2008-10-06 | Payer: Self-pay | Admitting: Emergency Medicine

## 2008-10-10 ENCOUNTER — Emergency Department (HOSPITAL_COMMUNITY): Admission: EM | Admit: 2008-10-10 | Discharge: 2008-10-10 | Payer: Self-pay | Admitting: Emergency Medicine

## 2008-10-20 ENCOUNTER — Inpatient Hospital Stay (HOSPITAL_COMMUNITY): Admission: EM | Admit: 2008-10-20 | Discharge: 2008-10-24 | Payer: Self-pay | Admitting: Emergency Medicine

## 2008-10-29 ENCOUNTER — Emergency Department (HOSPITAL_COMMUNITY): Admission: EM | Admit: 2008-10-29 | Discharge: 2008-10-30 | Payer: Self-pay | Admitting: Emergency Medicine

## 2008-10-31 ENCOUNTER — Emergency Department (HOSPITAL_COMMUNITY): Admission: EM | Admit: 2008-10-31 | Discharge: 2008-11-01 | Payer: Self-pay | Admitting: Emergency Medicine

## 2008-11-01 ENCOUNTER — Emergency Department (HOSPITAL_COMMUNITY): Admission: EM | Admit: 2008-11-01 | Discharge: 2008-11-02 | Payer: Self-pay | Admitting: Emergency Medicine

## 2008-11-04 ENCOUNTER — Observation Stay (HOSPITAL_COMMUNITY): Admission: EM | Admit: 2008-11-04 | Discharge: 2008-11-04 | Payer: Self-pay | Admitting: Emergency Medicine

## 2008-11-08 ENCOUNTER — Emergency Department (HOSPITAL_COMMUNITY): Admission: EM | Admit: 2008-11-08 | Discharge: 2008-11-08 | Payer: Self-pay | Admitting: Emergency Medicine

## 2008-11-09 ENCOUNTER — Emergency Department (HOSPITAL_COMMUNITY): Admission: EM | Admit: 2008-11-09 | Discharge: 2008-11-09 | Payer: Self-pay | Admitting: Emergency Medicine

## 2008-11-10 ENCOUNTER — Emergency Department (HOSPITAL_COMMUNITY): Admission: EM | Admit: 2008-11-10 | Discharge: 2008-11-10 | Payer: Self-pay | Admitting: Emergency Medicine

## 2008-11-12 ENCOUNTER — Emergency Department (HOSPITAL_COMMUNITY): Admission: EM | Admit: 2008-11-12 | Discharge: 2008-11-12 | Payer: Self-pay | Admitting: Emergency Medicine

## 2008-11-13 ENCOUNTER — Inpatient Hospital Stay (HOSPITAL_COMMUNITY): Admission: EM | Admit: 2008-11-13 | Discharge: 2008-11-16 | Payer: Self-pay | Admitting: Emergency Medicine

## 2008-11-17 ENCOUNTER — Emergency Department (HOSPITAL_COMMUNITY): Admission: EM | Admit: 2008-11-17 | Discharge: 2008-11-17 | Payer: Self-pay | Admitting: Emergency Medicine

## 2008-11-18 ENCOUNTER — Emergency Department (HOSPITAL_COMMUNITY): Admission: EM | Admit: 2008-11-18 | Discharge: 2008-11-18 | Payer: Self-pay | Admitting: Emergency Medicine

## 2008-11-20 ENCOUNTER — Emergency Department (HOSPITAL_COMMUNITY): Admission: EM | Admit: 2008-11-20 | Discharge: 2008-11-20 | Payer: Self-pay | Admitting: *Deleted

## 2008-11-22 ENCOUNTER — Emergency Department (HOSPITAL_COMMUNITY): Admission: EM | Admit: 2008-11-22 | Discharge: 2008-11-22 | Payer: Self-pay | Admitting: Emergency Medicine

## 2008-11-24 ENCOUNTER — Emergency Department (HOSPITAL_COMMUNITY): Admission: EM | Admit: 2008-11-24 | Discharge: 2008-11-24 | Payer: Self-pay | Admitting: Emergency Medicine

## 2008-11-25 ENCOUNTER — Ambulatory Visit: Payer: Self-pay | Admitting: Obstetrics and Gynecology

## 2008-11-25 ENCOUNTER — Emergency Department (HOSPITAL_COMMUNITY): Admission: EM | Admit: 2008-11-25 | Discharge: 2008-11-26 | Payer: Self-pay | Admitting: Emergency Medicine

## 2008-11-27 ENCOUNTER — Ambulatory Visit (HOSPITAL_COMMUNITY): Admission: RE | Admit: 2008-11-27 | Discharge: 2008-11-27 | Payer: Self-pay | Admitting: Obstetrics and Gynecology

## 2008-11-29 ENCOUNTER — Emergency Department (HOSPITAL_COMMUNITY): Admission: EM | Admit: 2008-11-29 | Discharge: 2008-11-30 | Payer: Self-pay | Admitting: Emergency Medicine

## 2008-12-02 ENCOUNTER — Emergency Department (HOSPITAL_COMMUNITY): Admission: EM | Admit: 2008-12-02 | Discharge: 2008-12-02 | Payer: Self-pay | Admitting: Emergency Medicine

## 2008-12-05 ENCOUNTER — Emergency Department (HOSPITAL_COMMUNITY): Admission: EM | Admit: 2008-12-05 | Discharge: 2008-12-06 | Payer: Self-pay | Admitting: Emergency Medicine

## 2008-12-07 ENCOUNTER — Emergency Department (HOSPITAL_COMMUNITY): Admission: EM | Admit: 2008-12-07 | Discharge: 2008-12-07 | Payer: Self-pay | Admitting: Emergency Medicine

## 2008-12-08 ENCOUNTER — Inpatient Hospital Stay: Payer: Self-pay | Admitting: Internal Medicine

## 2008-12-17 ENCOUNTER — Emergency Department (HOSPITAL_COMMUNITY): Admission: EM | Admit: 2008-12-17 | Discharge: 2008-12-17 | Payer: Self-pay | Admitting: Emergency Medicine

## 2008-12-23 ENCOUNTER — Emergency Department (HOSPITAL_COMMUNITY): Admission: EM | Admit: 2008-12-23 | Discharge: 2008-12-24 | Payer: Self-pay | Admitting: Emergency Medicine

## 2008-12-25 ENCOUNTER — Emergency Department: Payer: Self-pay | Admitting: Emergency Medicine

## 2008-12-25 ENCOUNTER — Emergency Department (HOSPITAL_COMMUNITY): Admission: EM | Admit: 2008-12-25 | Discharge: 2008-12-25 | Payer: Self-pay | Admitting: Emergency Medicine

## 2008-12-29 ENCOUNTER — Emergency Department (HOSPITAL_COMMUNITY): Admission: EM | Admit: 2008-12-29 | Discharge: 2008-12-29 | Payer: Self-pay | Admitting: Emergency Medicine

## 2008-12-30 ENCOUNTER — Emergency Department (HOSPITAL_COMMUNITY): Admission: EM | Admit: 2008-12-30 | Discharge: 2008-12-30 | Payer: Self-pay | Admitting: Emergency Medicine

## 2009-01-01 ENCOUNTER — Ambulatory Visit: Payer: Self-pay | Admitting: Obstetrics and Gynecology

## 2009-01-06 ENCOUNTER — Emergency Department (HOSPITAL_COMMUNITY): Admission: EM | Admit: 2009-01-06 | Discharge: 2009-01-06 | Payer: Self-pay | Admitting: Emergency Medicine

## 2009-01-10 ENCOUNTER — Emergency Department (HOSPITAL_COMMUNITY): Admission: EM | Admit: 2009-01-10 | Discharge: 2009-01-10 | Payer: Self-pay | Admitting: Emergency Medicine

## 2009-01-14 ENCOUNTER — Emergency Department (HOSPITAL_COMMUNITY): Admission: EM | Admit: 2009-01-14 | Discharge: 2009-01-15 | Payer: Self-pay | Admitting: Emergency Medicine

## 2009-01-19 ENCOUNTER — Emergency Department (HOSPITAL_COMMUNITY): Admission: EM | Admit: 2009-01-19 | Discharge: 2009-01-19 | Payer: Self-pay | Admitting: Emergency Medicine

## 2009-01-20 ENCOUNTER — Emergency Department (HOSPITAL_COMMUNITY): Admission: EM | Admit: 2009-01-20 | Discharge: 2009-01-21 | Payer: Self-pay | Admitting: Emergency Medicine

## 2009-01-24 ENCOUNTER — Emergency Department (HOSPITAL_COMMUNITY): Admission: EM | Admit: 2009-01-24 | Discharge: 2009-01-24 | Payer: Self-pay | Admitting: Emergency Medicine

## 2009-01-25 ENCOUNTER — Emergency Department (HOSPITAL_COMMUNITY): Admission: EM | Admit: 2009-01-25 | Discharge: 2009-01-25 | Payer: Self-pay | Admitting: Emergency Medicine

## 2009-01-28 ENCOUNTER — Emergency Department (HOSPITAL_COMMUNITY): Admission: EM | Admit: 2009-01-28 | Discharge: 2009-01-28 | Payer: Self-pay | Admitting: Emergency Medicine

## 2009-01-30 ENCOUNTER — Emergency Department (HOSPITAL_COMMUNITY): Admission: EM | Admit: 2009-01-30 | Discharge: 2009-01-31 | Payer: Self-pay | Admitting: Emergency Medicine

## 2009-02-03 ENCOUNTER — Emergency Department (HOSPITAL_COMMUNITY): Admission: EM | Admit: 2009-02-03 | Discharge: 2009-02-03 | Payer: Self-pay | Admitting: Emergency Medicine

## 2009-02-10 ENCOUNTER — Emergency Department (HOSPITAL_COMMUNITY): Admission: EM | Admit: 2009-02-10 | Discharge: 2009-02-10 | Payer: Self-pay | Admitting: Emergency Medicine

## 2009-02-10 ENCOUNTER — Ambulatory Visit (HOSPITAL_COMMUNITY): Admission: RE | Admit: 2009-02-10 | Discharge: 2009-02-10 | Payer: Self-pay | Admitting: General Surgery

## 2009-02-12 ENCOUNTER — Emergency Department (HOSPITAL_COMMUNITY): Admission: EM | Admit: 2009-02-12 | Discharge: 2009-02-13 | Payer: Self-pay | Admitting: Emergency Medicine

## 2009-02-26 ENCOUNTER — Emergency Department (HOSPITAL_COMMUNITY): Admission: EM | Admit: 2009-02-26 | Discharge: 2009-02-26 | Payer: Self-pay | Admitting: Family Medicine

## 2009-02-26 ENCOUNTER — Emergency Department (HOSPITAL_COMMUNITY): Admission: EM | Admit: 2009-02-26 | Discharge: 2009-02-27 | Payer: Self-pay | Admitting: Emergency Medicine

## 2009-03-03 ENCOUNTER — Emergency Department (HOSPITAL_COMMUNITY): Admission: EM | Admit: 2009-03-03 | Discharge: 2009-03-03 | Payer: Self-pay | Admitting: Emergency Medicine

## 2009-03-06 ENCOUNTER — Emergency Department (HOSPITAL_COMMUNITY): Admission: EM | Admit: 2009-03-06 | Discharge: 2009-03-07 | Payer: Self-pay | Admitting: Emergency Medicine

## 2009-03-09 ENCOUNTER — Emergency Department (HOSPITAL_COMMUNITY): Admission: EM | Admit: 2009-03-09 | Discharge: 2009-03-09 | Payer: Self-pay | Admitting: Emergency Medicine

## 2009-03-19 ENCOUNTER — Emergency Department (HOSPITAL_COMMUNITY): Admission: EM | Admit: 2009-03-19 | Discharge: 2009-03-19 | Payer: Self-pay | Admitting: Emergency Medicine

## 2009-03-25 ENCOUNTER — Observation Stay (HOSPITAL_COMMUNITY): Admission: EM | Admit: 2009-03-25 | Discharge: 2009-04-04 | Payer: Self-pay | Admitting: Emergency Medicine

## 2009-04-01 ENCOUNTER — Ambulatory Visit: Payer: Self-pay | Admitting: Physical Medicine & Rehabilitation

## 2009-04-03 ENCOUNTER — Ambulatory Visit: Payer: Self-pay | Admitting: Physical Medicine & Rehabilitation

## 2009-04-22 ENCOUNTER — Emergency Department (HOSPITAL_COMMUNITY): Admission: EM | Admit: 2009-04-22 | Discharge: 2009-04-22 | Payer: Self-pay | Admitting: Emergency Medicine

## 2009-04-23 ENCOUNTER — Emergency Department (HOSPITAL_COMMUNITY): Admission: EM | Admit: 2009-04-23 | Discharge: 2009-04-23 | Payer: Self-pay | Admitting: Emergency Medicine

## 2009-04-30 ENCOUNTER — Emergency Department (HOSPITAL_COMMUNITY): Admission: EM | Admit: 2009-04-30 | Discharge: 2009-04-30 | Payer: Self-pay | Admitting: Emergency Medicine

## 2009-05-02 ENCOUNTER — Emergency Department (HOSPITAL_COMMUNITY): Admission: EM | Admit: 2009-05-02 | Discharge: 2009-05-02 | Payer: Self-pay | Admitting: Emergency Medicine

## 2009-05-28 ENCOUNTER — Ambulatory Visit (HOSPITAL_COMMUNITY): Admission: RE | Admit: 2009-05-28 | Discharge: 2009-05-28 | Payer: Self-pay | Admitting: Gastroenterology

## 2009-05-29 ENCOUNTER — Emergency Department (HOSPITAL_COMMUNITY): Admission: EM | Admit: 2009-05-29 | Discharge: 2009-05-30 | Payer: Self-pay | Admitting: Emergency Medicine

## 2009-06-06 ENCOUNTER — Emergency Department (HOSPITAL_COMMUNITY): Admission: EM | Admit: 2009-06-06 | Discharge: 2009-06-06 | Payer: Self-pay | Admitting: Emergency Medicine

## 2009-06-11 ENCOUNTER — Emergency Department (HOSPITAL_COMMUNITY): Admission: EM | Admit: 2009-06-11 | Discharge: 2009-06-12 | Payer: Self-pay | Admitting: Emergency Medicine

## 2009-06-12 ENCOUNTER — Inpatient Hospital Stay (HOSPITAL_COMMUNITY): Admission: EM | Admit: 2009-06-12 | Discharge: 2009-06-14 | Payer: Self-pay | Admitting: Emergency Medicine

## 2009-06-16 ENCOUNTER — Emergency Department (HOSPITAL_COMMUNITY): Admission: EM | Admit: 2009-06-16 | Discharge: 2009-06-17 | Payer: Self-pay | Admitting: Emergency Medicine

## 2009-06-20 ENCOUNTER — Ambulatory Visit (HOSPITAL_COMMUNITY): Admission: RE | Admit: 2009-06-20 | Discharge: 2009-06-21 | Payer: Self-pay | Admitting: General Surgery

## 2009-06-23 ENCOUNTER — Emergency Department (HOSPITAL_COMMUNITY): Admission: EM | Admit: 2009-06-23 | Discharge: 2009-06-23 | Payer: Self-pay | Admitting: Emergency Medicine

## 2009-06-28 ENCOUNTER — Emergency Department (HOSPITAL_COMMUNITY): Admission: EM | Admit: 2009-06-28 | Discharge: 2009-06-28 | Payer: Self-pay | Admitting: Emergency Medicine

## 2009-07-01 ENCOUNTER — Emergency Department (HOSPITAL_COMMUNITY): Admission: EM | Admit: 2009-07-01 | Discharge: 2009-07-02 | Payer: Self-pay | Admitting: Emergency Medicine

## 2009-07-02 ENCOUNTER — Emergency Department (HOSPITAL_COMMUNITY): Admission: EM | Admit: 2009-07-02 | Discharge: 2009-07-02 | Payer: Self-pay | Admitting: Emergency Medicine

## 2009-07-08 ENCOUNTER — Emergency Department (HOSPITAL_COMMUNITY): Admission: EM | Admit: 2009-07-08 | Discharge: 2009-07-08 | Payer: Self-pay | Admitting: Emergency Medicine

## 2009-07-16 ENCOUNTER — Emergency Department (HOSPITAL_COMMUNITY): Admission: EM | Admit: 2009-07-16 | Discharge: 2009-07-16 | Payer: Self-pay | Admitting: Emergency Medicine

## 2009-07-17 ENCOUNTER — Emergency Department (HOSPITAL_COMMUNITY): Admission: EM | Admit: 2009-07-17 | Discharge: 2009-07-17 | Payer: Self-pay | Admitting: Emergency Medicine

## 2009-07-25 ENCOUNTER — Encounter: Admission: RE | Admit: 2009-07-25 | Discharge: 2009-07-25 | Payer: Self-pay | Admitting: General Surgery

## 2009-07-28 ENCOUNTER — Emergency Department (HOSPITAL_COMMUNITY): Admission: EM | Admit: 2009-07-28 | Discharge: 2009-07-29 | Payer: Self-pay | Admitting: Emergency Medicine

## 2009-08-11 ENCOUNTER — Emergency Department (HOSPITAL_COMMUNITY): Admission: EM | Admit: 2009-08-11 | Discharge: 2009-08-12 | Payer: Self-pay | Admitting: Emergency Medicine

## 2009-08-12 ENCOUNTER — Emergency Department (HOSPITAL_COMMUNITY): Admission: EM | Admit: 2009-08-12 | Discharge: 2009-08-12 | Payer: Self-pay | Admitting: Emergency Medicine

## 2009-08-13 ENCOUNTER — Emergency Department (HOSPITAL_COMMUNITY): Admission: EM | Admit: 2009-08-13 | Discharge: 2009-08-13 | Payer: Self-pay | Admitting: Emergency Medicine

## 2009-08-18 ENCOUNTER — Emergency Department (HOSPITAL_COMMUNITY): Admission: EM | Admit: 2009-08-18 | Discharge: 2009-08-18 | Payer: Self-pay | Admitting: Emergency Medicine

## 2009-08-18 ENCOUNTER — Emergency Department (HOSPITAL_COMMUNITY): Admission: EM | Admit: 2009-08-18 | Discharge: 2009-08-19 | Payer: Self-pay | Admitting: Emergency Medicine

## 2009-08-25 ENCOUNTER — Emergency Department (HOSPITAL_COMMUNITY): Admission: EM | Admit: 2009-08-25 | Discharge: 2009-08-25 | Payer: Self-pay | Admitting: Emergency Medicine

## 2009-08-26 ENCOUNTER — Emergency Department (HOSPITAL_COMMUNITY): Admission: EM | Admit: 2009-08-26 | Discharge: 2009-08-26 | Payer: Self-pay | Admitting: Emergency Medicine

## 2009-09-12 ENCOUNTER — Inpatient Hospital Stay (HOSPITAL_COMMUNITY): Admission: EM | Admit: 2009-09-12 | Discharge: 2009-09-14 | Payer: Self-pay | Admitting: Emergency Medicine

## 2009-09-29 ENCOUNTER — Emergency Department (HOSPITAL_COMMUNITY): Admission: EM | Admit: 2009-09-29 | Discharge: 2009-09-30 | Payer: Self-pay | Admitting: Emergency Medicine

## 2009-10-02 ENCOUNTER — Inpatient Hospital Stay (HOSPITAL_COMMUNITY): Admission: EM | Admit: 2009-10-02 | Discharge: 2009-10-10 | Payer: Self-pay | Admitting: Emergency Medicine

## 2009-10-09 ENCOUNTER — Encounter (INDEPENDENT_AMBULATORY_CARE_PROVIDER_SITE_OTHER): Payer: Self-pay | Admitting: Internal Medicine

## 2009-10-18 ENCOUNTER — Emergency Department (HOSPITAL_COMMUNITY): Admission: EM | Admit: 2009-10-18 | Discharge: 2009-10-18 | Payer: Self-pay | Admitting: Emergency Medicine

## 2009-10-20 ENCOUNTER — Encounter: Payer: Self-pay | Admitting: Internal Medicine

## 2009-10-20 ENCOUNTER — Emergency Department (HOSPITAL_COMMUNITY): Admission: EM | Admit: 2009-10-20 | Discharge: 2009-10-21 | Payer: Self-pay | Admitting: Emergency Medicine

## 2009-10-20 LAB — CONVERTED CEMR LAB
Alkaline Phosphatase: 62 units/L
BUN: 10 mg/dL
Basophils Relative: 1 %
Calcium: 8.8 mg/dL
Glucose, Bld: 85 mg/dL
HCT: 36 %
Hemoglobin: 12.5 g/dL
MCV: 83.4 fL
Platelets: 290 10*3/uL
Potassium: 4 meq/L
RBC: 4.31 M/uL
Sodium: 140 meq/L
Total Protein: 7.2 g/dL
WBC: 8 10*3/uL

## 2009-10-27 ENCOUNTER — Encounter: Payer: Self-pay | Admitting: Internal Medicine

## 2009-10-28 ENCOUNTER — Ambulatory Visit: Payer: Self-pay | Admitting: Internal Medicine

## 2009-10-28 DIAGNOSIS — D649 Anemia, unspecified: Secondary | ICD-10-CM | POA: Insufficient documentation

## 2009-10-28 DIAGNOSIS — Z9189 Other specified personal risk factors, not elsewhere classified: Secondary | ICD-10-CM | POA: Insufficient documentation

## 2009-10-28 DIAGNOSIS — K219 Gastro-esophageal reflux disease without esophagitis: Secondary | ICD-10-CM

## 2009-10-28 DIAGNOSIS — G43909 Migraine, unspecified, not intractable, without status migrainosus: Secondary | ICD-10-CM | POA: Insufficient documentation

## 2009-10-28 DIAGNOSIS — N390 Urinary tract infection, site not specified: Secondary | ICD-10-CM | POA: Insufficient documentation

## 2009-10-30 ENCOUNTER — Emergency Department (HOSPITAL_COMMUNITY): Admission: EM | Admit: 2009-10-30 | Discharge: 2009-10-30 | Payer: Self-pay | Admitting: Emergency Medicine

## 2009-11-02 ENCOUNTER — Emergency Department (HOSPITAL_COMMUNITY): Admission: EM | Admit: 2009-11-02 | Discharge: 2009-11-02 | Payer: Self-pay | Admitting: Emergency Medicine

## 2009-11-04 ENCOUNTER — Emergency Department (HOSPITAL_COMMUNITY): Admission: EM | Admit: 2009-11-04 | Discharge: 2009-11-04 | Payer: Self-pay | Admitting: Emergency Medicine

## 2009-11-05 ENCOUNTER — Ambulatory Visit: Payer: Self-pay | Admitting: Internal Medicine

## 2009-11-10 ENCOUNTER — Emergency Department (HOSPITAL_COMMUNITY): Admission: EM | Admit: 2009-11-10 | Discharge: 2009-11-10 | Payer: Self-pay | Admitting: Emergency Medicine

## 2009-11-15 ENCOUNTER — Emergency Department (HOSPITAL_COMMUNITY): Admission: EM | Admit: 2009-11-15 | Discharge: 2009-11-15 | Payer: Self-pay | Admitting: Emergency Medicine

## 2009-11-18 ENCOUNTER — Emergency Department (HOSPITAL_COMMUNITY): Admission: EM | Admit: 2009-11-18 | Discharge: 2009-11-18 | Payer: Self-pay | Admitting: Emergency Medicine

## 2009-11-18 ENCOUNTER — Emergency Department (HOSPITAL_COMMUNITY): Admission: EM | Admit: 2009-11-18 | Discharge: 2009-11-18 | Payer: Self-pay | Admitting: Family Medicine

## 2009-11-19 ENCOUNTER — Encounter: Payer: Self-pay | Admitting: Internal Medicine

## 2009-11-19 ENCOUNTER — Telehealth: Payer: Self-pay | Admitting: Internal Medicine

## 2009-11-20 ENCOUNTER — Emergency Department (HOSPITAL_COMMUNITY): Admission: EM | Admit: 2009-11-20 | Discharge: 2009-11-20 | Payer: Self-pay | Admitting: Emergency Medicine

## 2009-11-22 ENCOUNTER — Emergency Department (HOSPITAL_COMMUNITY): Admission: EM | Admit: 2009-11-22 | Discharge: 2009-11-23 | Payer: Self-pay | Admitting: Emergency Medicine

## 2009-11-25 ENCOUNTER — Telehealth: Payer: Self-pay | Admitting: Internal Medicine

## 2009-11-26 ENCOUNTER — Emergency Department (HOSPITAL_COMMUNITY): Admission: EM | Admit: 2009-11-26 | Discharge: 2009-11-26 | Payer: Self-pay | Admitting: Emergency Medicine

## 2009-11-26 ENCOUNTER — Encounter: Payer: Self-pay | Admitting: Internal Medicine

## 2009-11-27 ENCOUNTER — Telehealth: Payer: Self-pay | Admitting: Internal Medicine

## 2009-11-27 ENCOUNTER — Emergency Department (HOSPITAL_COMMUNITY): Admission: EM | Admit: 2009-11-27 | Discharge: 2009-11-27 | Payer: Self-pay | Admitting: Emergency Medicine

## 2009-11-30 ENCOUNTER — Emergency Department (HOSPITAL_COMMUNITY): Admission: EM | Admit: 2009-11-30 | Discharge: 2009-12-01 | Payer: Self-pay | Admitting: Emergency Medicine

## 2009-12-05 ENCOUNTER — Emergency Department (HOSPITAL_COMMUNITY): Admission: EM | Admit: 2009-12-05 | Discharge: 2009-12-05 | Payer: Self-pay | Admitting: Emergency Medicine

## 2009-12-08 ENCOUNTER — Emergency Department (HOSPITAL_COMMUNITY): Admission: EM | Admit: 2009-12-08 | Discharge: 2009-12-09 | Payer: Self-pay | Admitting: Emergency Medicine

## 2009-12-11 ENCOUNTER — Emergency Department (HOSPITAL_COMMUNITY): Admission: EM | Admit: 2009-12-11 | Discharge: 2009-12-11 | Payer: Self-pay | Admitting: Emergency Medicine

## 2009-12-18 ENCOUNTER — Emergency Department (HOSPITAL_COMMUNITY): Admission: EM | Admit: 2009-12-18 | Discharge: 2009-12-19 | Payer: Self-pay | Admitting: Emergency Medicine

## 2009-12-27 ENCOUNTER — Emergency Department (HOSPITAL_COMMUNITY): Admission: EM | Admit: 2009-12-27 | Discharge: 2009-12-27 | Payer: Self-pay | Admitting: Emergency Medicine

## 2009-12-31 ENCOUNTER — Emergency Department (HOSPITAL_COMMUNITY): Admission: EM | Admit: 2009-12-31 | Discharge: 2010-01-01 | Payer: Self-pay | Admitting: Emergency Medicine

## 2010-01-09 ENCOUNTER — Emergency Department (HOSPITAL_COMMUNITY): Admission: EM | Admit: 2010-01-09 | Discharge: 2010-01-10 | Payer: Self-pay | Admitting: Emergency Medicine

## 2010-02-04 ENCOUNTER — Ambulatory Visit: Payer: Self-pay | Admitting: Internal Medicine

## 2010-02-18 ENCOUNTER — Emergency Department (HOSPITAL_COMMUNITY): Admission: EM | Admit: 2010-02-18 | Discharge: 2010-02-19 | Payer: Self-pay | Admitting: Emergency Medicine

## 2010-02-26 IMAGING — CR DG ABDOMEN ACUTE W/ 1V CHEST
3 series · 3 of 3 positions shown · non-contrast
Comparison: 11/02/2008

CLINICAL DATA: Vomiting.  Diarrhea.  Nausea.  Blood in stool.
Right lateral abdominal pain.

ACUTE ABDOMEN SERIES (ABDOMEN 2 VIEW & CHEST 1 VIEW)

[w chest pa *]
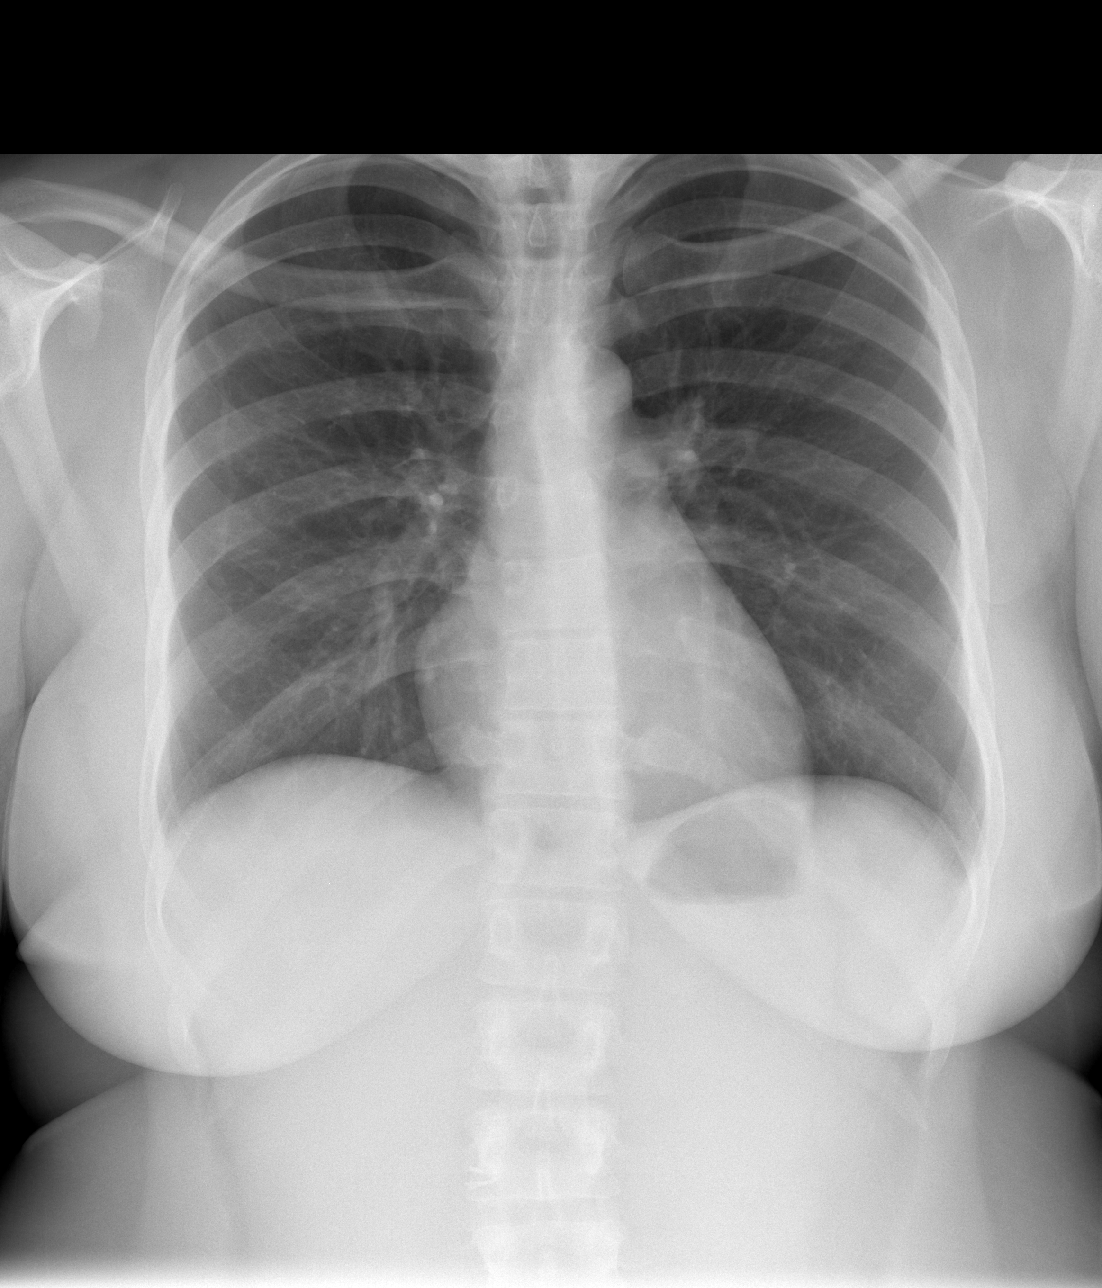

[w abdomen upright *]
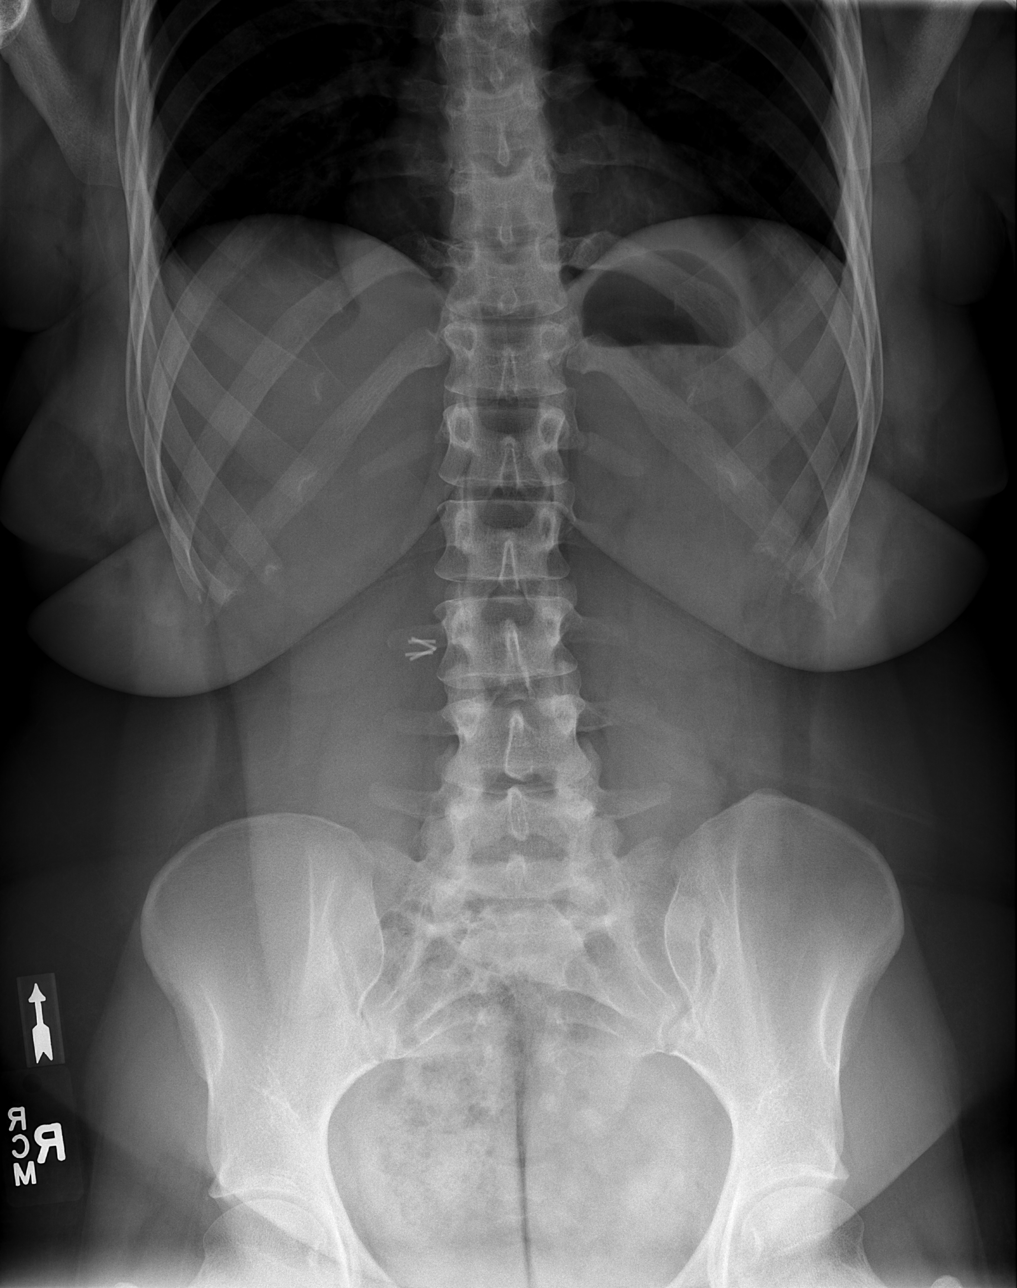

[t abdomen supine]
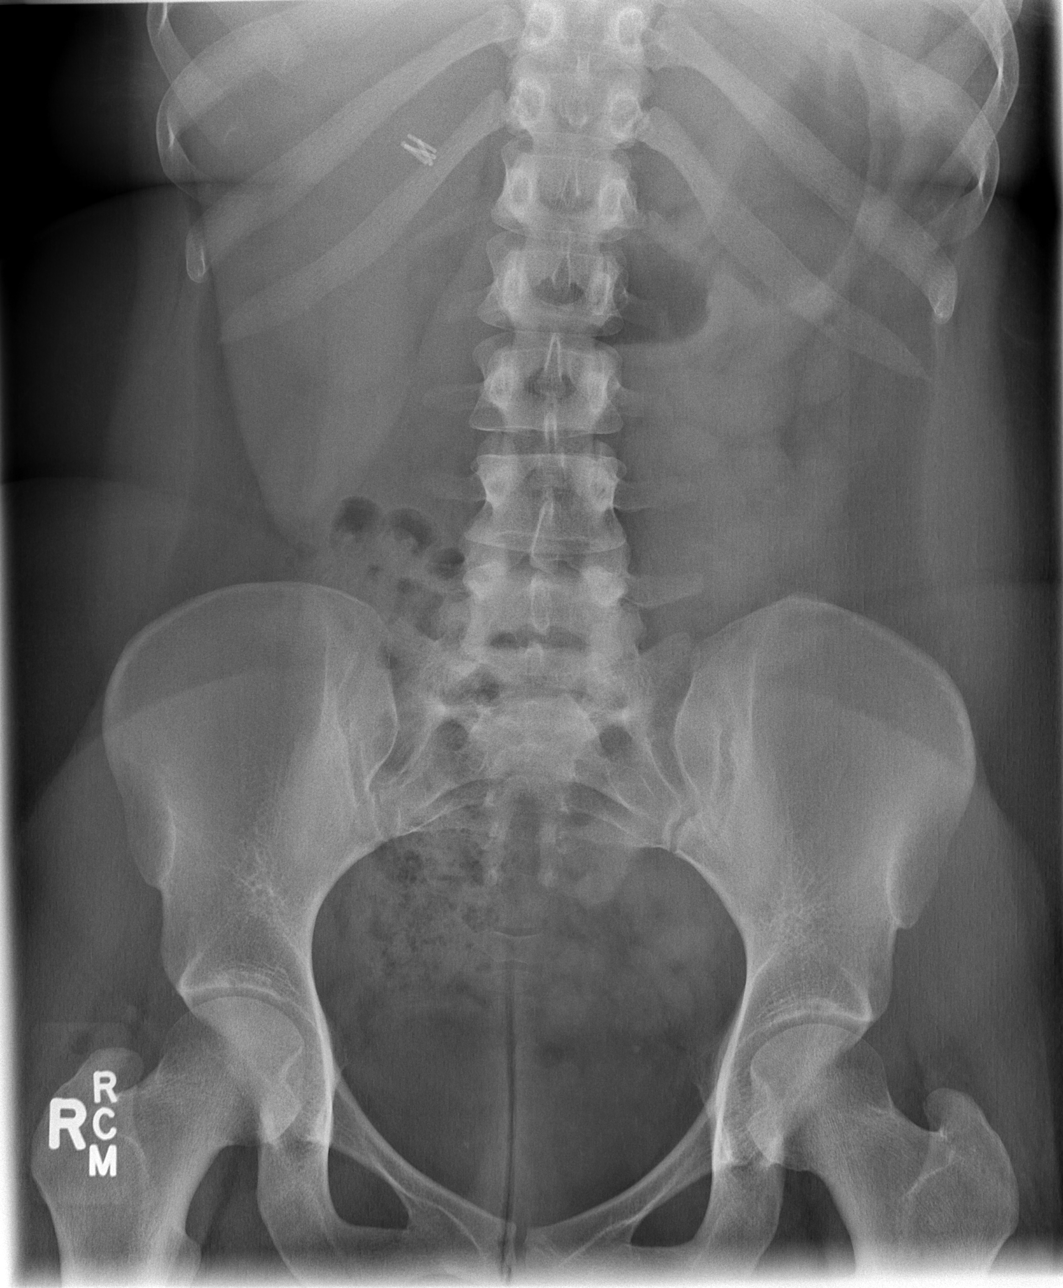

[3 of 3 positions shown; findings below may reference images not displayed]

FINDINGS: Cardiac and mediastinal contours appear normal.

The lungs appear clear.

No pleural effusion is identified. No free intraperitoneal gas is
identified.  The bowel is gasless except for some stool in the
right pelvis.

Mild prominence the right hepatic lobe may be due to a Riedel's
lobe.  The L1 vertebral level appears to have small associated
ribs.
IMPRESSION: 1.  No acute findings.

## 2010-03-12 IMAGING — CR DG ABDOMEN ACUTE W/ 1V CHEST
3 series · 3 of 3 positions shown · non-contrast
Comparison: 11/10/2008

CLINICAL DATA: Nausea, vomiting, diarrhea, right upper quadrant
pain, past history pancreatitis and ovarian cyst

ACUTE ABDOMEN SERIES (ABDOMEN 2 VIEW & CHEST 1 VIEW)

[w chest pa]
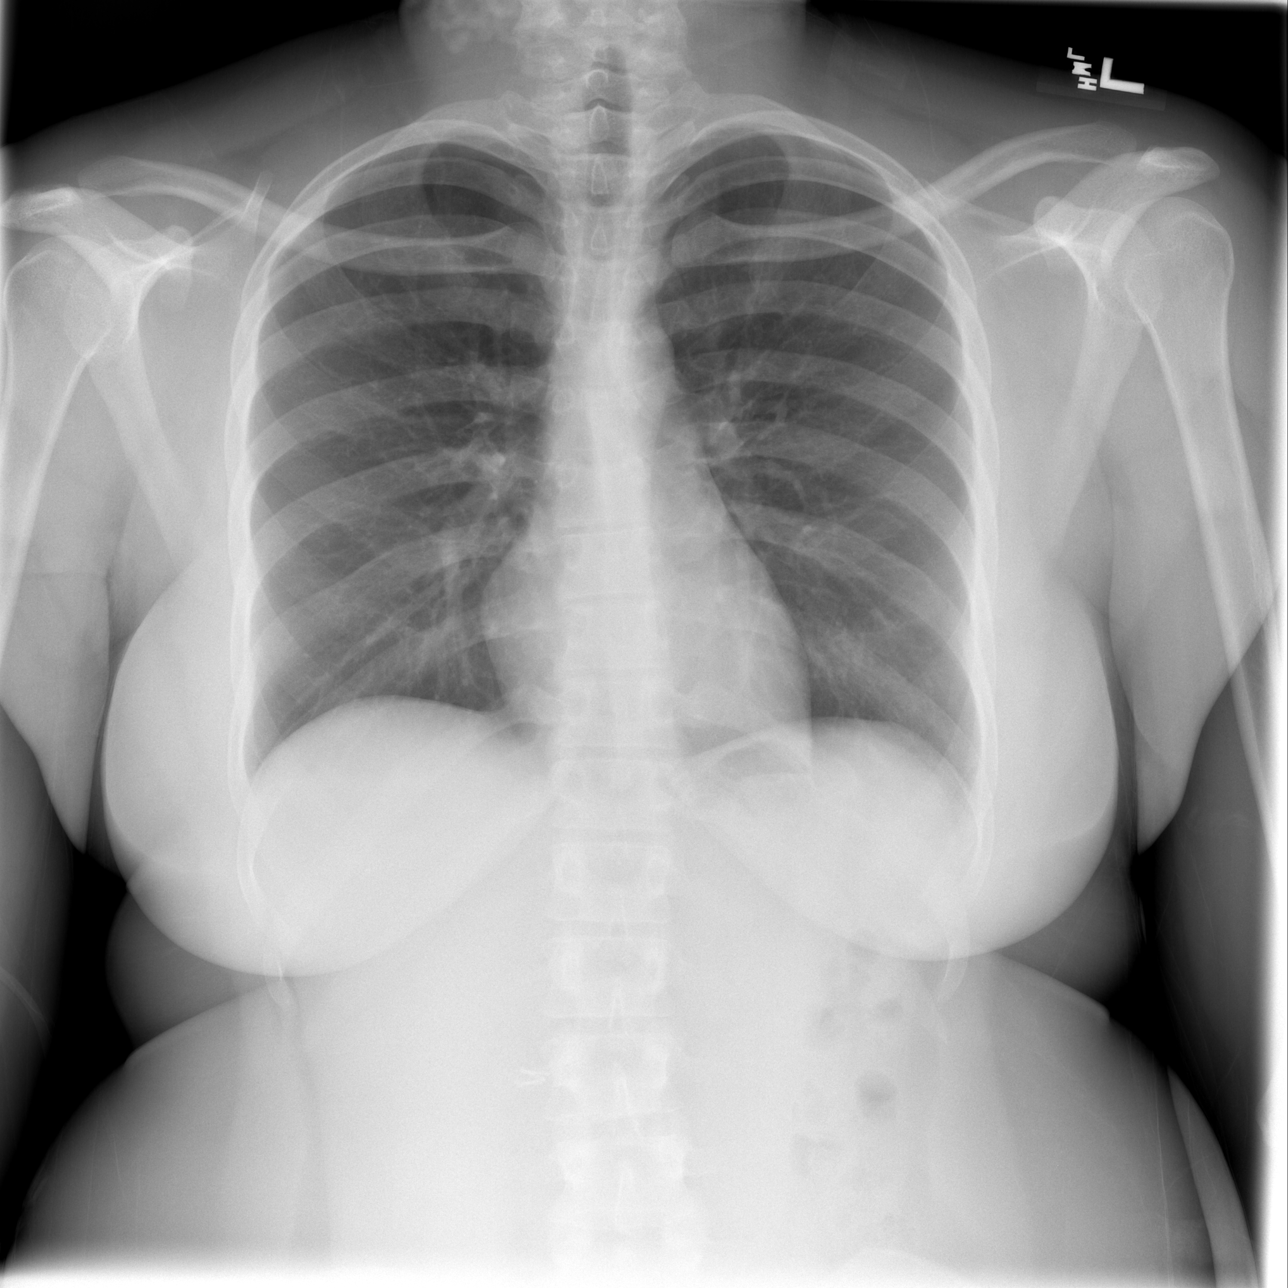

[w abdomen upright]
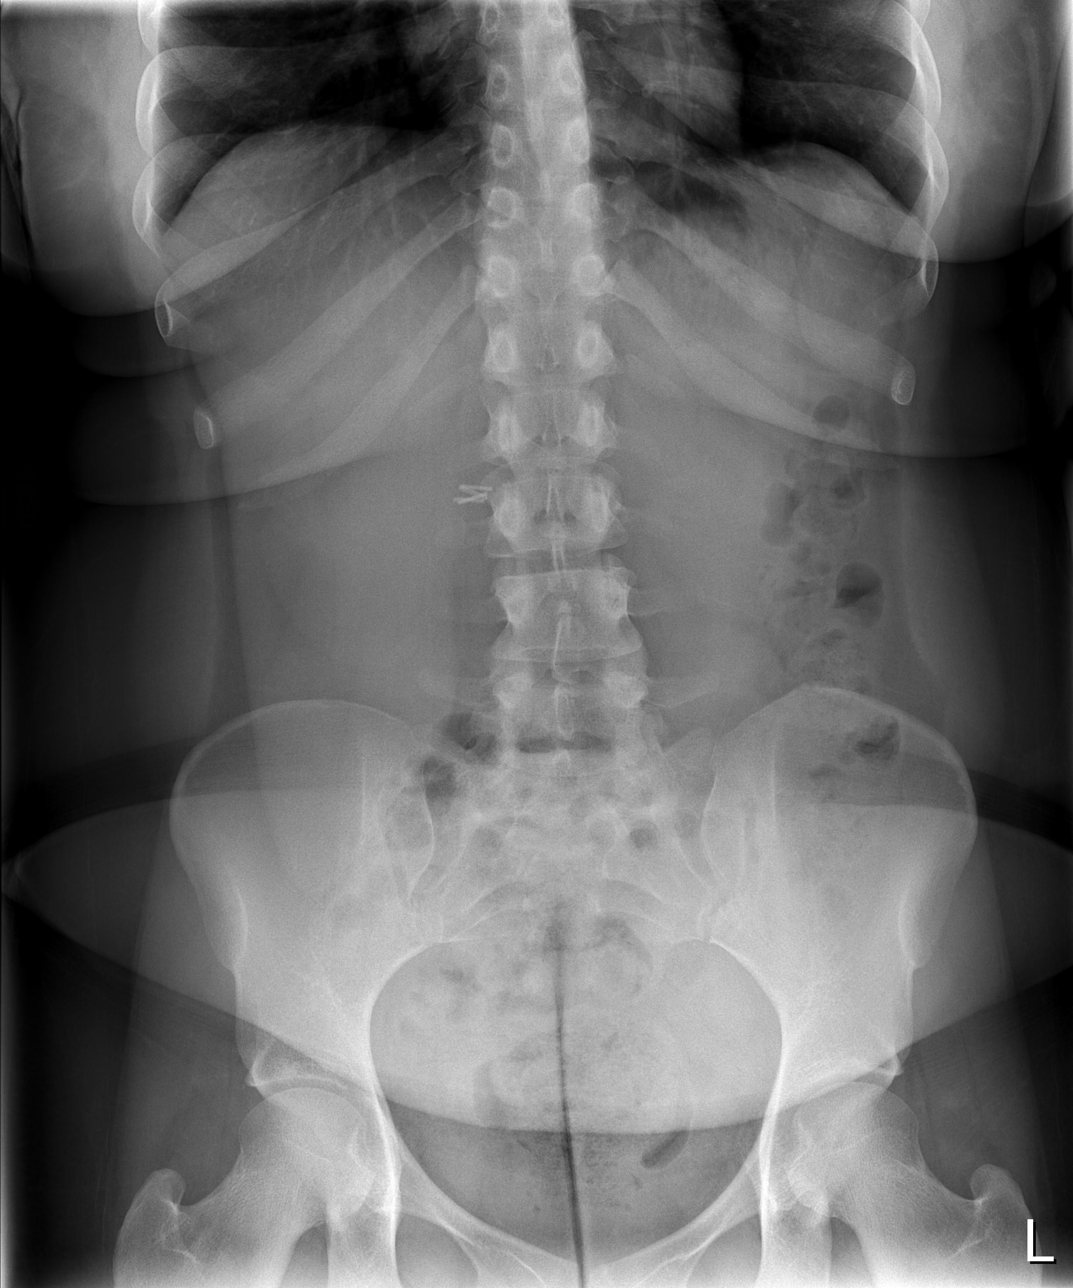

[t abdomen supine]
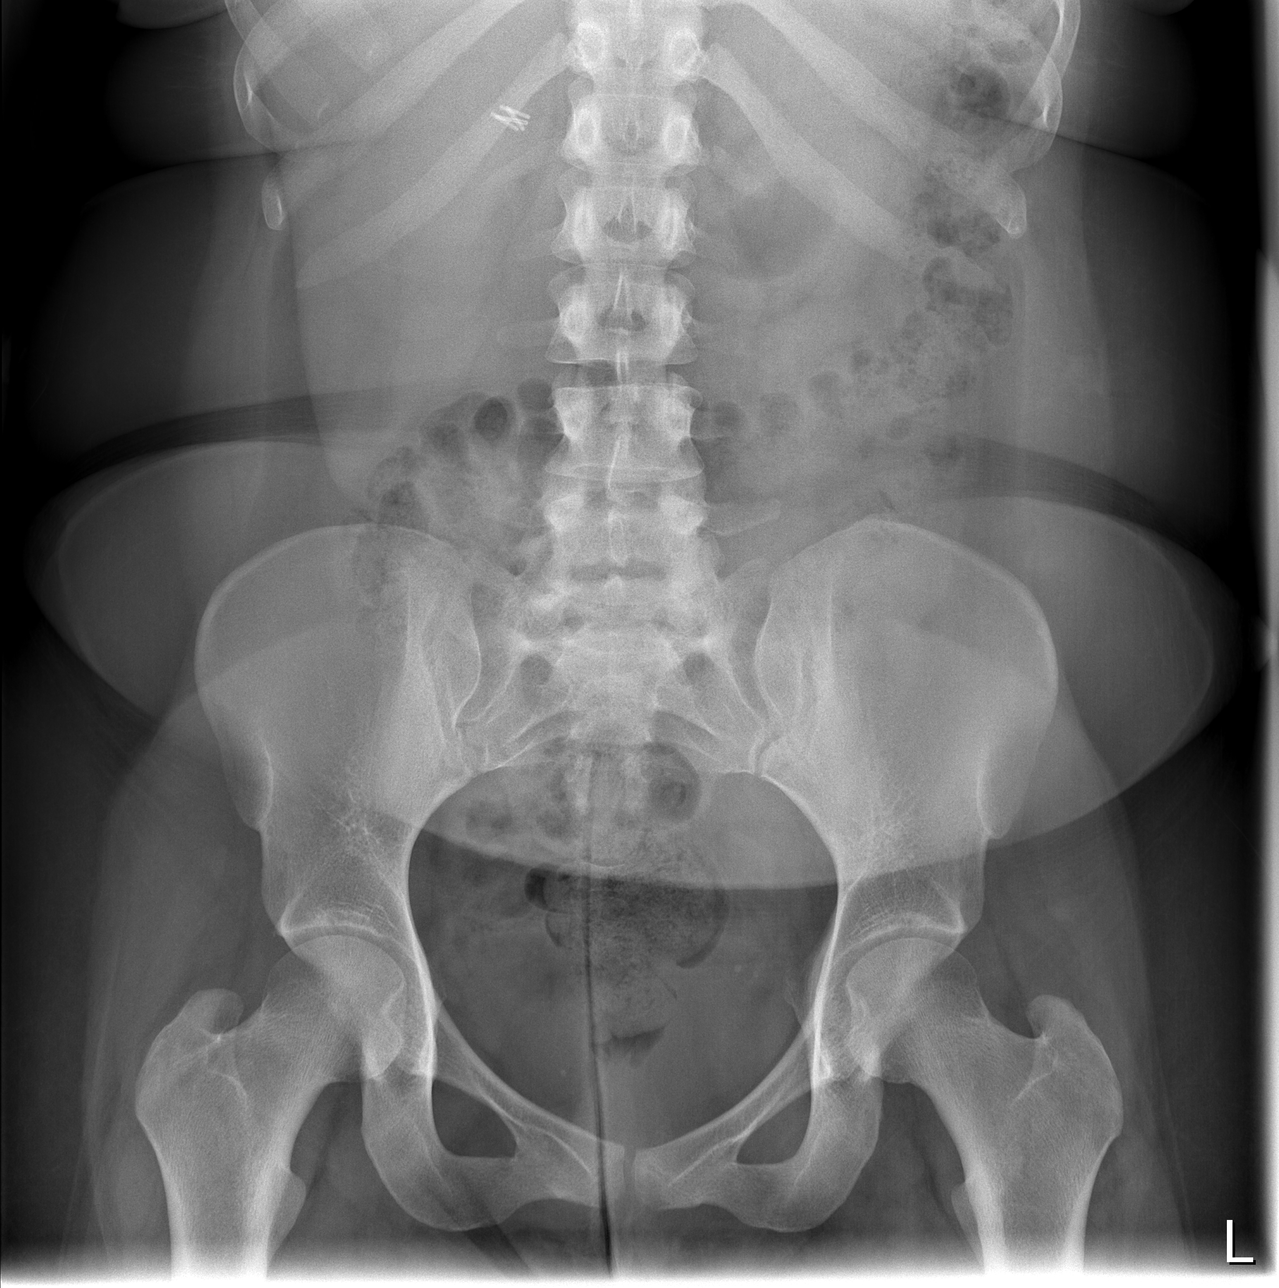

[3 of 3 positions shown; findings below may reference images not displayed]

FINDINGS: Normal heart size, mediastinal contours, and pulmonary vascularity.
Lungs clear.
Surgical clips right upper quadrant question cholecystectomy.
Nonspecific bowel gas pattern.
No bowel dilatation, evidence of obstruction, or free air.
Bones unremarkable.
No urinary tract calcification.
IMPRESSION: No acute abnormalities.

## 2010-04-06 ENCOUNTER — Emergency Department (HOSPITAL_COMMUNITY): Admission: EM | Admit: 2010-04-06 | Discharge: 2010-04-06 | Payer: Self-pay | Admitting: Emergency Medicine

## 2010-04-07 ENCOUNTER — Ambulatory Visit (HOSPITAL_COMMUNITY): Admission: RE | Admit: 2010-04-07 | Discharge: 2010-04-07 | Payer: Self-pay | Admitting: Family Medicine

## 2010-04-07 ENCOUNTER — Emergency Department (HOSPITAL_COMMUNITY): Admission: EM | Admit: 2010-04-07 | Discharge: 2010-04-07 | Payer: Self-pay | Admitting: Emergency Medicine

## 2010-04-17 ENCOUNTER — Inpatient Hospital Stay (HOSPITAL_COMMUNITY): Admission: EM | Admit: 2010-04-17 | Discharge: 2010-04-22 | Payer: Self-pay | Admitting: Emergency Medicine

## 2010-04-17 ENCOUNTER — Emergency Department (HOSPITAL_COMMUNITY): Admission: EM | Admit: 2010-04-17 | Discharge: 2010-04-17 | Payer: Self-pay | Admitting: Emergency Medicine

## 2010-05-18 ENCOUNTER — Emergency Department (HOSPITAL_COMMUNITY): Admission: EM | Admit: 2010-05-18 | Discharge: 2010-05-18 | Payer: Self-pay | Admitting: Emergency Medicine

## 2010-05-27 ENCOUNTER — Telehealth (INDEPENDENT_AMBULATORY_CARE_PROVIDER_SITE_OTHER): Payer: Self-pay | Admitting: *Deleted

## 2010-06-25 ENCOUNTER — Emergency Department (HOSPITAL_COMMUNITY)
Admission: EM | Admit: 2010-06-25 | Discharge: 2010-06-26 | Payer: Self-pay | Source: Home / Self Care | Admitting: Emergency Medicine

## 2010-07-17 ENCOUNTER — Emergency Department (HOSPITAL_COMMUNITY)
Admission: EM | Admit: 2010-07-17 | Discharge: 2010-07-18 | Payer: Self-pay | Source: Home / Self Care | Admitting: Emergency Medicine

## 2010-07-19 ENCOUNTER — Emergency Department (HOSPITAL_COMMUNITY)
Admission: EM | Admit: 2010-07-19 | Discharge: 2010-07-19 | Payer: Self-pay | Source: Home / Self Care | Admitting: Emergency Medicine

## 2010-07-20 LAB — COMPREHENSIVE METABOLIC PANEL
ALT: 30 U/L (ref 0–35)
AST: 40 U/L — ABNORMAL HIGH (ref 0–37)
Albumin: 3.5 g/dL (ref 3.5–5.2)
Alkaline Phosphatase: 60 U/L (ref 39–117)
BUN: 10 mg/dL (ref 6–23)
CO2: 25 mEq/L (ref 19–32)
Calcium: 8.7 mg/dL (ref 8.4–10.5)
Chloride: 108 mEq/L (ref 96–112)
Creatinine, Ser: 0.88 mg/dL (ref 0.4–1.2)
GFR calc Af Amer: >60 mL/min (ref >60)
GFR calc non Af Amer: >60 mL/min (ref >60)
Glucose, Bld: 95 mg/dL (ref 70–99)
Potassium: 3.7 mEq/L (ref 3.5–5.1)
Sodium: 139 mEq/L (ref 135–145)
Total Bilirubin: <0.1 mg/dL — ABNORMAL LOW (ref 0.3–1.2)
Total Protein: 6.7 g/dL (ref 6.0–8.3)

## 2010-07-20 LAB — URINALYSIS, ROUTINE W REFLEX MICROSCOPIC
Bilirubin Urine: NEGATIVE
Ketones, ur: 15 mg/dL — AB
Leukocytes, UA: NEGATIVE
Nitrite: NEGATIVE
Protein, ur: NEGATIVE mg/dL
Specific Gravity, Urine: >1.03 — ABNORMAL HIGH (ref 1.005–1.030)
Urine Glucose, Fasting: NEGATIVE mg/dL
Urobilinogen, UA: 0.2 mg/dL (ref 0.0–1.0)
pH: 6 (ref 5.0–8.0)

## 2010-07-20 LAB — URINE MICROSCOPIC-ADD ON

## 2010-07-20 LAB — POCT PREGNANCY, URINE: Preg Test, Ur: NEGATIVE

## 2010-07-20 LAB — LIPASE, BLOOD: Lipase: 71 U/L — ABNORMAL HIGH (ref 11–59)

## 2010-07-23 ENCOUNTER — Emergency Department (HOSPITAL_COMMUNITY)
Admission: EM | Admit: 2010-07-23 | Discharge: 2010-07-24 | Payer: Self-pay | Source: Home / Self Care | Admitting: Emergency Medicine

## 2010-07-26 ENCOUNTER — Encounter: Payer: Self-pay | Admitting: Gastroenterology

## 2010-07-26 ENCOUNTER — Encounter: Payer: Self-pay | Admitting: General Surgery

## 2010-07-27 ENCOUNTER — Emergency Department (HOSPITAL_COMMUNITY)
Admission: EM | Admit: 2010-07-27 | Discharge: 2010-07-27 | Payer: Self-pay | Source: Home / Self Care | Admitting: Emergency Medicine

## 2010-07-27 LAB — DIFFERENTIAL
Eosinophils Absolute: 0.5 10*3/uL (ref 0.0–0.7)
Eosinophils Relative: 6 % — ABNORMAL HIGH (ref 0–5)
Lymphocytes Relative: 31 % (ref 12–46)
Lymphs Abs: 2.5 10*3/uL (ref 0.7–4.0)
Monocytes Absolute: 0.4 10*3/uL (ref 0.1–1.0)

## 2010-07-27 LAB — COMPREHENSIVE METABOLIC PANEL
ALT: 15 U/L (ref 0–35)
BUN: 16 mg/dL (ref 6–23)
CO2: 26 mEq/L (ref 19–32)
Calcium: 9 mg/dL (ref 8.4–10.5)
Creatinine, Ser: 0.64 mg/dL (ref 0.4–1.2)
GFR calc non Af Amer: >60 mL/min (ref >60)
Glucose, Bld: 94 mg/dL (ref 70–99)

## 2010-07-27 LAB — URINALYSIS, ROUTINE W REFLEX MICROSCOPIC
Bilirubin Urine: NEGATIVE
Hgb urine dipstick: NEGATIVE
Ketones, ur: NEGATIVE mg/dL
Nitrite: NEGATIVE
Protein, ur: NEGATIVE mg/dL
Urobilinogen, UA: 0.2 mg/dL (ref 0.0–1.0)

## 2010-07-27 LAB — LIPASE, BLOOD: Lipase: 29 U/L (ref 11–59)

## 2010-07-27 LAB — CBC
HCT: 37.3 % (ref 36.0–46.0)
MCHC: 33.8 g/dL (ref 30.0–36.0)
MCV: 81.6 fL (ref 78.0–100.0)
Platelets: 292 10*3/uL (ref 150–400)
RDW: 11.8 % (ref 11.5–15.5)

## 2010-07-29 ENCOUNTER — Emergency Department (HOSPITAL_COMMUNITY)
Admission: EM | Admit: 2010-07-29 | Discharge: 2010-07-30 | Payer: Self-pay | Source: Home / Self Care | Admitting: Emergency Medicine

## 2010-07-29 LAB — CBC
HCT: 37.9 % (ref 36.0–46.0)
Platelets: 281 10*3/uL (ref 150–400)
RBC: 4.64 MIL/uL (ref 3.87–5.11)
RDW: 11.8 % (ref 11.5–15.5)
WBC: 8.4 10*3/uL (ref 4.0–10.5)

## 2010-07-29 LAB — DIFFERENTIAL
Basophils Absolute: 0 10*3/uL (ref 0.0–0.1)
Eosinophils Relative: 6 % — ABNORMAL HIGH (ref 0–5)
Lymphocytes Relative: 30 % (ref 12–46)
Neutrophils Relative %: 55 % (ref 43–77)

## 2010-07-29 LAB — COMPREHENSIVE METABOLIC PANEL
ALT: 11 U/L (ref 0–35)
Albumin: 3.5 g/dL (ref 3.5–5.2)
Alkaline Phosphatase: 55 U/L (ref 39–117)
Glucose, Bld: 96 mg/dL (ref 70–99)
Potassium: 3.7 mEq/L (ref 3.5–5.1)
Sodium: 135 mEq/L (ref 135–145)
Total Protein: 7 g/dL (ref 6.0–8.3)

## 2010-07-30 LAB — URINALYSIS, ROUTINE W REFLEX MICROSCOPIC
Bilirubin Urine: NEGATIVE
Hgb urine dipstick: NEGATIVE
Specific Gravity, Urine: 1.025 (ref 1.005–1.030)
pH: 7.5 (ref 5.0–8.0)

## 2010-07-30 LAB — URINE MICROSCOPIC-ADD ON

## 2010-08-03 ENCOUNTER — Emergency Department: Payer: Self-pay | Admitting: Emergency Medicine

## 2010-08-04 NOTE — Letter (Signed)
Summary: Unable to schedule eval/[facility]  Unable to schedule eval/[facility]   Imported By: Sherian Rein 11/27/2009 13:18:03  _____________________________________________________________________  External Attachment:    Type:   Image     Comment:   External Document

## 2010-08-04 NOTE — Assessment & Plan Note (Signed)
Summary: 1-2 WK FU  STC   Vital Signs:  Patient profile:   30 year old female Height:      65 inches (165.10 cm) Weight:      157 pounds (71.36 kg) O2 Sat:      95 % on Room air Temp:     98.4 degrees F (36.89 degrees C) oral Pulse rate:   82 / minute BP sitting:   112 / 70  (left arm) Cuff size:   regular  Vitals Entered By: Orlan Leavens (Nov 05, 2009 1:20 PM)  O2 Flow:  Room air CC: 1-2 week follow-up Is Patient Diabetic? No Pain Assessment Patient in pain? no        Primary Care Provider:  Newt Lukes MD  CC:  1-2 week follow-up.  History of Present Illness: here for followup  since last OV 1 week ago has been to ER x 2 - normal labs and xray  prior OV reviewed from 4/26/11today:  1) chronic abd pain -  pain generally located on the right side exac with recent hosp for ? flare of chronic pancreatitis 3/31 - 10/10/2009 multiple freq ER vists for same: in recent months: 7 ER visits 06/2009, 5 ED in 07/2009, 7 ED in 08/2009, 3 in 09/2009 (2 ending in overnight or more hosp), 2 more ED visits so far this month 10/2009 - denies any associated vomitting or fever - worst with any by mouth intake - "makes my insides twist up and hurt so i can't eat" chronic diarrhea  - extensive GI eval locally ([facility]) for same - last colo 10/09/09 - normal with neg bx also follows with gen surg (toth) for LOA x 2 prior episodes, last 06/2009 prev followed at pain clinic but not goping at this time becuase doesn't want to "take pills all the time" reports current pain level is 7/10 - out of painpills "5 weeks" - though given rx for dilaudid pills 4/8 at dc  2) GERD - intol of PPI and H2B - no N/V - no hx PUD - GI eval reviewed  3) depression - has seen psyc for same + above somaztization symptoms in past - pt denies current depression symptoms - not on any meds for same and declines consideration of same - no SI/HI  Current Medications (verified): 1)  Estradiol 2 Mg Tabs (Estradiol) ....  Take 1 By Mouth Qd 2)  Tylenol Extra Strength 500 Mg Tabs (Acetaminophen) .... 2 Tab By Mouth Every 8 Hours For Pain  Allergies (verified): 1)  ! Ibuprofen (Ibuprofen) 2)  ! Ultram 3)  ! Darvocet 4)  ! Codeine 5)  ! Morphine 6)  ! Celebrex (Celecoxib) 7)  ! Ketorolac Tromethamine (Ketorolac Tromethamine) 8)  ! Demerol (Meperidine Hcl)  Past History:  Past Medical History: complex social problems/denial, lack of awareness of reality. chronic right side abdominal pain Chronic pancreatitis    Colonoscopy 10/13/2009-normal   last CT abdomen and pelvis3/05/2010-no acute   prev in [facility] Clinic-[facility] Dr. Tonye Royalty  #[phone] Anemia-NOS, hx Asthma, hx GERD  MD rooster: GI - [facility] - schooler/outlaw gen surg - Carolynne Edouard  Review of Systems  The patient denies anorexia, fever, weight loss, and syncope.    Physical Exam  General:  alert, well-developed, well-nourished, and cooperative to examination.   nontoxic and comfortable - NAD Lungs:  normal respiratory effort, no intercostal retractions or use of accessory muscles; normal breath sounds bilaterally - no crackles and no wheezes.  Heart:  normal rate, regular rhythm, no murmur, and no rub. BLE without edema. Abdomen:  soft, no distention - well healed abd scars from prior exp surg - no mass, hernia - no rebound or gaurding but reports pain along right abd palp = normoactive BS throughout Psych:  Oriented X3, memory intact for recent and remote, normally interactive, good eye contact, not anxious appearing, not depressed appearing, and not agitated.       Impression & Recommendations:  Problem # 1:  ABDOMINAL PAIN (ICD-789.00) has been to ER x 2 since last OV 1 week ago! normal labs, no SBO on KUB has appt at [facility] for eval of same 11/26/09 - dr. Vianne Bulls at 3pm remains complicated and difficult to treat.   no longer going to pain mgmt because she "doesn't want all those addictive pills" - did not ask  for vicodin this time i re-explained i would not provide chronic narcotics - will refer to new pain mgmt clinic if this is necessary - pt declines same  Complete Medication List: 1)  Estradiol 2 Mg Tabs (Estradiol) .... Take 1 by mouth qd 2)  Tylenol Extra Strength 500 Mg Tabs (Acetaminophen) .... 2 tab by mouth every 8 hours for pain  Patient Instructions: 1)  it was good to see you today.  2)  keep appointment with dr. Vianne Bulls 11/26/09 at [facility] - 3)  try melatonin for sleep - can but at drug store without prescription- 4)  if you will go to counseling, let me know and i will make referral for same - i think this would be a good idea 5)  Please schedule a follow-up appointment in 3-4 months, sooner if problems.

## 2010-08-04 NOTE — Progress Notes (Signed)
Summary: Dr. Juanda Chance Declined to Accept.   Phone Note Outgoing Call Call back at [facility] Phone [phone]   Call placed by: Harlow Mares CMA Duncan Dull),  May 27, 2010 1:19 PM Call placed to: Patient Summary of Call: Dr. Juanda Chance recieved patients records and reviewed what she recieved from [facility] which included [facility] records. Upon futher review of this patients EMR chart she has also seen Dr. [name] and Dr. Fredric Mare at [facility]. She has been denied by several pain clinics. Dr. Juanda Chance has made the decision to not accept the patient into her practice at this time the patient has had multiple GI work ups. I have left a message on the patients voicemail to notify her. I have discarded the records.  Initial call taken by: Harlow Mares CMA Duncan Dull),  May 27, 2010 1:23 PM     Appended Document: Dr. Juanda Chance Declined to Accept.  correction patient was seen at [facility] by Dr Vianne Bulls ([facility]) 11/26/09 @ 3pm

## 2010-08-04 NOTE — Assessment & Plan Note (Signed)
Summary: NEW/ [place]ID /[place]  #   Vital Signs:  Patient profile:   30 year old female Height:      65 inches (165.10 cm) Weight:      157.0 pounds (71.36 kg) BMI:     26.22 O2 Sat:      99 % on Room air Temp:     98.0 degrees F (36.67 degrees C) oral Pulse rate:   68 / minute BP sitting:   108 / 84  (left arm) Cuff size:   regular  Vitals Entered By: Orlan Leavens (October 28, 2009 9:42 AM)  O2 Flow:  Room air CC: New patient Is Patient Diabetic? No Pain Assessment Patient in pain? no        Primary Care Provider:  Newt Lukes MD  CC:  New patient.  History of Present Illness: new pt to me and our practice, here to est care - prev followed at [facility] and dr. Annitta Jersey -  1) chronic abd pain - generally located on the right side exac with recent hosp for ? flare of chronic pancreatitis 3/31 - 10/10/2009 multiple freq ER vists for same: in recent months: 7 ER visits 06/2009, 5 ED in 07/2009, 7 ED in 08/2009, 3 in 09/2009 (2 ending in overnight or more hosp), 2 more ED visits so far this month 10/2009 - denies any associated vomitting or fever - worst with any by mouth intake - "makes my insides twist up and hurt so i can't eat" chronic diarrhea  - extensive GI eval locally ([facility]) for same - last colo 10/09/09 - normal with neg bx also follows with gen surg ([facility]oth) for LOA x 2 prior episodes, last 06/2009 prev followed at pain clinic but not goping at this time becuase doesn't want to "take pills all the time" reports current pain level is 7/10 - out of painpills "5 weeks" - though given rx for dilaudid pills 4/8 at dc  2) GERD - intol of PPI and H2B - no N/V - no hx PUD - GI eval reviewed  3) depression - has seen psyc for same + above somaztization symptoms in past - pt denies current depression symptoms - not on any meds for same and declines consideration of same - no SI/HI  Preventive Screening-Counseling & Management  Alcohol-Tobacco     Smoking Status: quit  Current  Medications (verified): 1)  Estradiol 2 Mg Tabs (Estradiol) .... Take 1 By Mouth Qd  Allergies: 1)  ! Ibuprofen (Ibuprofen) 2)  ! Ultram 3)  ! Darvocet 4)  ! Codeine 5)  ! Morphine 6)  ! Celebrex (Celecoxib) 7)  ! Ketorolac Tromethamine (Ketorolac Tromethamine) 8)  ! Demerol (Meperidine Hcl)  Past History:  Past Medical History: complex social problems/denial, lack of awareness of reality. chronic right side abdominal pain Chronic pancreatitis    Colonoscopy 10/13/2009-normal   last CT abdomen and pelvis3/05/2010-no acute   prev in Pain Clinic-[facility] Dr. Tonye Royalty  #[phone] Anemia-NOS, hx Asthma, hx GERD  MD rooster: GI - [facility] - schooler gen surg Carolynne Edouard  Family History: DM, Obesity-mother and grandmother No Cancer Family History Hypertension (mother) Family History Lung cancer (other relative)  Social History: quit High School (10th grade); Son in first grade, daughter in pre K-living situatation changes married, supportive husband Poverty, linage of poor health, depression, and lack of insight Former Smoker - quit 08/2009 Smoking Status:  quit  Review of Systems       The patient complains  of abdominal pain.  The patient denies fever, weight loss, chest pain, syncope, melena, hematochezia, and severe indigestion/heartburn.    Physical Exam  General:  alert, well-developed, well-nourished, and cooperative to examination.   nontoxic and comfortable - NAD Eyes:  vision grossly intact; pupils equal, round and reactive to light.  conjunctiva and lids normal.    Lungs:  normal respiratory effort, no intercostal retractions or use of accessory muscles; normal breath sounds bilaterally - no crackles and no wheezes.    Heart:  normal rate, regular rhythm, no murmur, and no rub. BLE without edema. Abdomen:  soft, mild distention - well healed abd scars from prior exp surg - no mass, hernia - no rebound or gaurding but reports pain along right abd  palp = normoactive BS throughout Psych:  Oriented X3, memory intact for recent and remote, normally interactive, good eye contact, not anxious appearing, not depressed appearing, and not agitated.       Impression & Recommendations:  Problem # 1:  ABDOMINAL PAIN (ICD-789.00)  Complicated and difficult to treat by hx review.   no longer going to pain mgmt because she "doesn't want all those addictive pills" - but then asks for vicodin i explained i would not provide chronic narcotics - will refer to new painmgmt clinic if this is necessary - f/u on referral to [facility], dr. Fredric Mare, made during hosp 10/10/09 dc - try celebrex 200mg  once daily with OTC tylenol --- now pt reports celebrex allg so will cancel e-rx extensive hosp and procedures reviewed - Time spent with patient 45 minutes, more than 50% of this time was spent reviewing recent hosp, tests, labs and hx other interventions and provider consults in the [facility] EMR  Orders: Misc. Referral (Misc. Ref)  Problem # 2:  PANCREATITIS, CHRONIC (ICD-577.1)  see above -  recent CT 09/12/09 and ER labs reviewed - unremarkable  Orders: Misc. Referral (Misc. Ref)  Problem # 3:  DEPRESSION, MAJOR, RECURRENT (ICD-296.30)  Complete Medication List: 1)  Estradiol 2 Mg Tabs (Estradiol) .... Take 1 by mouth qd 2)  Tylenol Extra Strength 500 Mg Tabs (Acetaminophen) .... 2 tab by mouth every 8 hours for pain  Patient Instructions: 1)  it was good to see you today.  2)  try Celebrex 200mg  once daily along with extra strength tylenol as discussed for pain - samples given to try - your prescription has been electronically submitted to your pharmacy. Please take as directed. Contact our office if you believe you're having problems with the medication(s).  3)  i will not prescribe Vicodin or any other narcotics -- but we can make referral to pain clinic for this if it is needed - 4)  keep hydrated and take small bites or sips of protein shakes all day  long even if you can not eat large regular meals 5)  will look into status of referral to Dr. Fredric Mare at [facility] 6)  keep followup with Dr. Carolynne Edouard as planned 7)  Please schedule a follow-up appointment here in 1-2 weeks to review, call sooner if problems.  Prescriptions: CELEBREX 200 MG CAPS (CELECOXIB) 1 by mouth once daily  #30 x 1   Entered and Authorized by:   Newt Lukes MD   Signed by:   Newt Lukes MD on 10/28/2009   Method used:   Electronically to        [facility] [id number]* (retail)       [address]  [place], [place]  [address]       Ph: [phone]       Fax: [phone]   RxID:   [id number]

## 2010-08-04 NOTE — Progress Notes (Signed)
Summary: referral  Phone Note Call from Patient Call back at Home Phone [phone]   Caller: Patient Summary of Call: pt called requesting referral to Pain Mgmt Clinic Initial call taken by: Margaret Pyle, CMA,  Nov 19, 2009 10:57 AM  Follow-up for Phone Call        sure - done Follow-up by: Newt Lukes MD,  Nov 19, 2009 11:56 AM  Additional Follow-up for Phone Call Additional follow up Details #1::        pt informed to expect a call from pcc regarding appt Additional Follow-up by: Margaret Pyle, CMA,  Nov 19, 2009 12:57 PM

## 2010-08-04 NOTE — Progress Notes (Signed)
Summary: Ibuprofen  Phone Note Call from Patient Call back at Home Phone [phone]   Caller: Patient Summary of Call: pt called requesting Rx for Ibuprofen while she waits for pain clinic appt.  Initial call taken by: Margaret Pyle, CMA,  Nov 25, 2009 10:49 AM  Follow-up for Phone Call        Dr Felicity Coyer [facility] was unable to see this pt , you asked to refer elsewhere so I sent  referral to [facility] pain clinic received a fax from there office stating "sorry we do not accept pt Medicaid as primary only as secondary". I informed pt of this .  pls advise pt has no appt with a pain clinic I will try [facility] Shelbie Proctor  Nov 25, 2009 11:17 AM     Additional Follow-up for Phone Call Additional follow up Details #1::        ibuprofen ordered as per pt request - erx done Additional Follow-up by: Newt Lukes MD,  Nov 25, 2009 11:24 AM    Additional Follow-up for Phone Call Additional follow up Details #2::    pt states that she had procedure done this morning, draining of cyst in RT ear. This is why she requested pain meds. Pt informed of Rx Follow-up by: Margaret Pyle, CMA,  Nov 25, 2009 11:45 AM  New/Updated Medications: IBUPROFEN 600 MG TABS (IBUPROFEN) 1 by mouth three times a day as needed for pain Prescriptions: IBUPROFEN 600 MG TABS (IBUPROFEN) 1 by mouth three times a day as needed for pain  #40 x 1   Entered and Authorized by:   Newt Lukes MD   Signed by:   Margaret Pyle, CMA on 11/25/2009   Method used:   Electronically to        [facility] [phone]* (retail)       [address]       [place], [place]  [address]       Ph: [phone] or [phone]       Fax: [phone]   RxID:   [id number]

## 2010-08-04 NOTE — Progress Notes (Signed)
Summary: FYI ER visit  Phone Note Call from Patient Call back at [facility] Phone [phone]   Caller: Patient Summary of Call: pt called stating that she was seen in the ER dept last night and her Pancreatic enzyme levels were 104. Pt says "it feels like it is getting worse", she has been vomiting "green" and has had diarrhea since last night. Pt says that there might be a possiblity that she may have to return to ER tonight. She just wanted MD to know. Initial call taken by: Margaret Pyle, CMA,  Nov 27, 2009 4:15 PM  Follow-up for Phone Call        noted Follow-up by: Newt Lukes MD,  Nov 28, 2009 7:52 AM

## 2010-08-04 NOTE — Letter (Signed)
Summary: Rejection/[facility]  Rejection/[facility]   Imported By: Lester Peabody 12/02/2009 09:05:54  _____________________________________________________________________  External Attachment:    Type:   Image     Comment:   External Document

## 2010-08-05 ENCOUNTER — Emergency Department (HOSPITAL_COMMUNITY)
Admission: EM | Admit: 2010-08-05 | Discharge: 2010-08-06 | Disposition: A | Discharge status: Home / Self Care | Payer: Medicaid Other | Attending: Emergency Medicine | Admitting: Emergency Medicine

## 2010-08-05 DIAGNOSIS — J45909 Unspecified asthma, uncomplicated: Secondary | ICD-10-CM | POA: Insufficient documentation

## 2010-08-05 DIAGNOSIS — R1013 Epigastric pain: Secondary | ICD-10-CM | POA: Insufficient documentation

## 2010-08-05 DIAGNOSIS — K861 Other chronic pancreatitis: Secondary | ICD-10-CM | POA: Insufficient documentation

## 2010-08-06 LAB — COMPREHENSIVE METABOLIC PANEL
ALT: 30 U/L (ref 0–35)
AST: 26 U/L (ref 0–37)
Calcium: 9.2 mg/dL (ref 8.4–10.5)
Creatinine, Ser: 0.68 mg/dL (ref 0.4–1.2)
GFR calc Af Amer: >60 mL/min (ref >60)
GFR calc non Af Amer: >60 mL/min (ref >60)
Glucose, Bld: 100 mg/dL — ABNORMAL HIGH (ref 70–99)
Sodium: 137 mEq/L (ref 135–145)
Total Protein: 7.2 g/dL (ref 6.0–8.3)

## 2010-08-06 LAB — LIPASE, BLOOD: Lipase: 39 U/L (ref 11–59)

## 2010-08-10 ENCOUNTER — Emergency Department (HOSPITAL_COMMUNITY)
Admission: EM | Admit: 2010-08-10 | Discharge: 2010-08-11 | Disposition: A | Discharge status: Home / Self Care | Payer: Medicaid Other | Attending: Emergency Medicine | Admitting: Emergency Medicine

## 2010-08-10 DIAGNOSIS — J45909 Unspecified asthma, uncomplicated: Secondary | ICD-10-CM | POA: Insufficient documentation

## 2010-08-10 DIAGNOSIS — K861 Other chronic pancreatitis: Secondary | ICD-10-CM | POA: Insufficient documentation

## 2010-08-10 DIAGNOSIS — R110 Nausea: Secondary | ICD-10-CM | POA: Insufficient documentation

## 2010-08-10 DIAGNOSIS — R1011 Right upper quadrant pain: Secondary | ICD-10-CM | POA: Insufficient documentation

## 2010-08-10 DIAGNOSIS — R109 Unspecified abdominal pain: Secondary | ICD-10-CM | POA: Insufficient documentation

## 2010-08-11 LAB — CBC
HCT: 36.7 % (ref 36.0–46.0)
Hemoglobin: 12.3 g/dL (ref 12.0–15.0)
MCV: 81.2 fL (ref 78.0–100.0)
RBC: 4.52 MIL/uL (ref 3.87–5.11)
RDW: 11.9 % (ref 11.5–15.5)
WBC: 8.8 10*3/uL (ref 4.0–10.5)

## 2010-08-11 LAB — DIFFERENTIAL
Basophils Absolute: 0.1 10*3/uL (ref 0.0–0.1)
Eosinophils Relative: 5 % (ref 0–5)
Lymphocytes Relative: 34 % (ref 12–46)
Lymphs Abs: 3 10*3/uL (ref 0.7–4.0)
Neutro Abs: 4.7 10*3/uL (ref 1.7–7.7)

## 2010-08-11 LAB — URINALYSIS, ROUTINE W REFLEX MICROSCOPIC
Bilirubin Urine: NEGATIVE
Hgb urine dipstick: NEGATIVE
Ketones, ur: NEGATIVE mg/dL
Nitrite: NEGATIVE
Specific Gravity, Urine: 1.025 (ref 1.005–1.030)
Urine Glucose, Fasting: NEGATIVE mg/dL
pH: 6.5 (ref 5.0–8.0)

## 2010-08-11 LAB — POCT I-STAT, CHEM 8
BUN: 13 mg/dL (ref 6–23)
Calcium, Ion: 1.12 mmol/L (ref 1.12–1.32)
Chloride: 107 mEq/L (ref 96–112)
Creatinine, Ser: 0.8 mg/dL (ref 0.4–1.2)
Glucose, Bld: 102 mg/dL — ABNORMAL HIGH (ref 70–99)
HCT: 38 % (ref 36.0–46.0)
Potassium: 3.6 mEq/L (ref 3.5–5.1)

## 2010-09-10 ENCOUNTER — Emergency Department (HOSPITAL_COMMUNITY)
Admission: EM | Admit: 2010-09-10 | Discharge: 2010-09-11 | Disposition: A | Discharge status: Home / Self Care | Payer: Medicaid Other | Attending: Emergency Medicine | Admitting: Emergency Medicine

## 2010-09-10 DIAGNOSIS — H9209 Otalgia, unspecified ear: Secondary | ICD-10-CM | POA: Insufficient documentation

## 2010-09-10 DIAGNOSIS — R109 Unspecified abdominal pain: Secondary | ICD-10-CM | POA: Insufficient documentation

## 2010-09-14 LAB — URINALYSIS, ROUTINE W REFLEX MICROSCOPIC
Bilirubin Urine: NEGATIVE
Glucose, UA: NEGATIVE mg/dL
Ketones, ur: NEGATIVE mg/dL
Protein, ur: NEGATIVE mg/dL
Urobilinogen, UA: 0.2 mg/dL (ref 0.0–1.0)

## 2010-09-14 LAB — CBC
HCT: 37.3 % (ref 36.0–46.0)
Hemoglobin: 12.7 g/dL (ref 12.0–15.0)
MCH: 27.8 pg (ref 26.0–34.0)
MCHC: 34 g/dL (ref 30.0–36.0)
RBC: 4.57 MIL/uL (ref 3.87–5.11)

## 2010-09-14 LAB — COMPREHENSIVE METABOLIC PANEL
ALT: 30 U/L (ref 0–35)
AST: 35 U/L (ref 0–37)
Albumin: 3.8 g/dL (ref 3.5–5.2)
Alkaline Phosphatase: 60 U/L (ref 39–117)
BUN: 10 mg/dL (ref 6–23)
Chloride: 109 mEq/L (ref 96–112)
Potassium: 3.7 mEq/L (ref 3.5–5.1)
Sodium: 141 mEq/L (ref 135–145)
Total Bilirubin: <0.1 mg/dL — ABNORMAL LOW (ref 0.3–1.2)
Total Protein: 7.5 g/dL (ref 6.0–8.3)

## 2010-09-14 LAB — DIFFERENTIAL
Basophils Absolute: 0.1 10*3/uL (ref 0.0–0.1)
Basophils Relative: 1 % (ref 0–1)
Eosinophils Absolute: 0.5 10*3/uL (ref 0.0–0.7)
Eosinophils Relative: 7 % — ABNORMAL HIGH (ref 0–5)
Monocytes Absolute: 0.6 10*3/uL (ref 0.1–1.0)
Monocytes Relative: 8 % (ref 3–12)
Neutro Abs: 3.7 10*3/uL (ref 1.7–7.7)

## 2010-09-14 LAB — URINE MICROSCOPIC-ADD ON

## 2010-09-15 ENCOUNTER — Observation Stay (HOSPITAL_COMMUNITY)
Admission: EM | Admit: 2010-09-15 | Discharge: 2010-09-16 | Disposition: A | Discharge status: Home / Self Care | Payer: Medicaid Other | Attending: Emergency Medicine | Admitting: Emergency Medicine

## 2010-09-15 DIAGNOSIS — R197 Diarrhea, unspecified: Secondary | ICD-10-CM | POA: Insufficient documentation

## 2010-09-15 DIAGNOSIS — R1011 Right upper quadrant pain: Secondary | ICD-10-CM | POA: Insufficient documentation

## 2010-09-15 DIAGNOSIS — R1031 Right lower quadrant pain: Secondary | ICD-10-CM | POA: Insufficient documentation

## 2010-09-15 DIAGNOSIS — E860 Dehydration: Secondary | ICD-10-CM | POA: Insufficient documentation

## 2010-09-15 DIAGNOSIS — R112 Nausea with vomiting, unspecified: Secondary | ICD-10-CM | POA: Insufficient documentation

## 2010-09-15 DIAGNOSIS — G8929 Other chronic pain: Principal | ICD-10-CM | POA: Insufficient documentation

## 2010-09-15 DIAGNOSIS — R1013 Epigastric pain: Secondary | ICD-10-CM | POA: Insufficient documentation

## 2010-09-15 LAB — DIFFERENTIAL
Basophils Relative: 1 % (ref 0–1)
Eosinophils Absolute: 0.5 10*3/uL (ref 0.0–0.7)
Lymphs Abs: 3.1 10*3/uL (ref 0.7–4.0)
Neutro Abs: 4.6 10*3/uL (ref 1.7–7.7)
Neutrophils Relative %: 51 % (ref 43–77)

## 2010-09-15 LAB — POCT I-STAT, CHEM 8
BUN: 19 mg/dL (ref 6–23)
Calcium, Ion: 1.14 mmol/L (ref 1.12–1.32)
Chloride: 110 mEq/L (ref 96–112)
HCT: 38 % (ref 36.0–46.0)
Sodium: 142 mEq/L (ref 135–145)

## 2010-09-15 LAB — URINALYSIS, ROUTINE W REFLEX MICROSCOPIC
Nitrite: NEGATIVE
Specific Gravity, Urine: 1.038 — ABNORMAL HIGH (ref 1.005–1.030)
Urobilinogen, UA: 1 mg/dL (ref 0.0–1.0)
pH: 6.5 (ref 5.0–8.0)

## 2010-09-15 LAB — POCT PREGNANCY, URINE: Preg Test, Ur: NEGATIVE

## 2010-09-15 LAB — CBC
Hemoglobin: 12.6 g/dL (ref 12.0–15.0)
MCV: 80.7 fL (ref 78.0–100.0)
Platelets: 246 10*3/uL (ref 150–400)
RBC: 4.57 MIL/uL (ref 3.87–5.11)
WBC: 9.1 10*3/uL (ref 4.0–10.5)

## 2010-09-16 LAB — CBC
HCT: 38.7 % (ref 36.0–46.0)
MCH: 27.3 pg (ref 26.0–34.0)
MCH: 27.3 pg (ref 26.0–34.0)
MCH: 27.9 pg (ref 26.0–34.0)
MCHC: 33 g/dL (ref 30.0–36.0)
MCHC: 33.2 g/dL (ref 30.0–36.0)
MCV: 81.1 fL (ref 78.0–100.0)
Platelets: 248 10*3/uL (ref 150–400)
Platelets: 267 10*3/uL (ref 150–400)
RBC: 4.43 MIL/uL (ref 3.87–5.11)
RDW: 12 % (ref 11.5–15.5)
RDW: 12.1 % (ref 11.5–15.5)
WBC: 9.1 10*3/uL (ref 4.0–10.5)

## 2010-09-16 LAB — COMPREHENSIVE METABOLIC PANEL
ALT: 155 U/L — ABNORMAL HIGH (ref 0–35)
ALT: 88 U/L — ABNORMAL HIGH (ref 0–35)
AST: 169 U/L — ABNORMAL HIGH (ref 0–37)
AST: 490 U/L — ABNORMAL HIGH (ref 0–37)
Albumin: 3.4 g/dL — ABNORMAL LOW (ref 3.5–5.2)
Albumin: 3.4 g/dL — ABNORMAL LOW (ref 3.5–5.2)
Albumin: 3.5 g/dL (ref 3.5–5.2)
Albumin: 3.5 g/dL (ref 3.5–5.2)
Alkaline Phosphatase: 57 U/L (ref 39–117)
Alkaline Phosphatase: 76 U/L (ref 39–117)
Alkaline Phosphatase: 81 U/L (ref 39–117)
BUN: 11 mg/dL (ref 6–23)
BUN: 5 mg/dL — ABNORMAL LOW (ref 6–23)
CO2: 26 mEq/L (ref 19–32)
Calcium: 8.2 mg/dL — ABNORMAL LOW (ref 8.4–10.5)
Calcium: 8.5 mg/dL (ref 8.4–10.5)
Chloride: 104 mEq/L (ref 96–112)
Chloride: 106 mEq/L (ref 96–112)
Creatinine, Ser: 0.62 mg/dL (ref 0.4–1.2)
Creatinine, Ser: 0.62 mg/dL (ref 0.4–1.2)
Creatinine, Ser: 0.66 mg/dL (ref 0.4–1.2)
GFR calc Af Amer: >60 mL/min (ref >60)
GFR calc Af Amer: >60 mL/min (ref >60)
GFR calc non Af Amer: >60 mL/min (ref >60)
GFR calc non Af Amer: >60 mL/min (ref >60)
Glucose, Bld: 70 mg/dL (ref 70–99)
Glucose, Bld: 77 mg/dL (ref 70–99)
Glucose, Bld: 88 mg/dL (ref 70–99)
Potassium: 3.6 mEq/L (ref 3.5–5.1)
Potassium: 3.8 mEq/L (ref 3.5–5.1)
Potassium: 4.1 mEq/L (ref 3.5–5.1)
Sodium: 137 mEq/L (ref 135–145)
Sodium: 137 mEq/L (ref 135–145)
Sodium: 139 mEq/L (ref 135–145)
Total Bilirubin: 0.4 mg/dL (ref 0.3–1.2)
Total Bilirubin: 0.6 mg/dL (ref 0.3–1.2)
Total Bilirubin: 0.7 mg/dL (ref 0.3–1.2)
Total Protein: 6.9 g/dL (ref 6.0–8.3)
Total Protein: 7 g/dL (ref 6.0–8.3)
Total Protein: 7.9 g/dL (ref 6.0–8.3)

## 2010-09-16 LAB — URINALYSIS, ROUTINE W REFLEX MICROSCOPIC
Bilirubin Urine: NEGATIVE
Glucose, UA: NEGATIVE mg/dL
Hgb urine dipstick: NEGATIVE
Ketones, ur: NEGATIVE mg/dL
Protein, ur: NEGATIVE mg/dL

## 2010-09-16 LAB — LIPASE, BLOOD
Lipase: 36 U/L (ref 11–59)
Lipase: 42 U/L (ref 11–59)
Lipase: 42 U/L (ref 11–59)

## 2010-09-16 LAB — DIFFERENTIAL
Basophils Absolute: 0 10*3/uL (ref 0.0–0.1)
Basophils Relative: 0 % (ref 0–1)
Eosinophils Relative: 8 % — ABNORMAL HIGH (ref 0–5)
Lymphocytes Relative: 34 % (ref 12–46)
Lymphs Abs: 1.8 10*3/uL (ref 0.7–4.0)
Monocytes Absolute: 0.5 10*3/uL (ref 0.1–1.0)
Monocytes Relative: 7 % (ref 3–12)
Neutro Abs: 5.4 10*3/uL (ref 1.7–7.7)
Neutrophils Relative %: 59 % (ref 43–77)

## 2010-09-16 LAB — PROTEIN ELECTROPH W RFLX QUANT IMMUNOGLOBULINS
Alpha-2-Globulin: 9.6 % (ref 7.1–11.8)
Beta Globulin: 7.6 % — ABNORMAL HIGH (ref 4.7–7.2)
Gamma Globulin: 18.2 % (ref 11.1–18.8)
M-Spike, %: NOT DETECTED g/dL

## 2010-09-16 LAB — RAPID URINE DRUG SCREEN, HOSP PERFORMED
Barbiturates: NOT DETECTED
Benzodiazepines: NOT DETECTED
Cocaine: NOT DETECTED
Opiates: NOT DETECTED

## 2010-09-16 LAB — MRSA PCR SCREENING: MRSA by PCR: NEGATIVE

## 2010-09-16 LAB — HEPATITIS B SURFACE ANTIBODY,QUALITATIVE: Hep B S Ab: POSITIVE — AB

## 2010-09-16 LAB — HEPATITIS B DNA, ULTRAQUANTITATIVE, PCR

## 2010-09-16 LAB — ANTI-SMOOTH MUSCLE ANTIBODY, IGG: F-Actin IgG: <20 U (ref <20)

## 2010-09-16 LAB — HEPATITIS B E ANTIGEN: Hep B E Ag: NONREACTIVE

## 2010-09-16 LAB — HEPATIC FUNCTION PANEL
AST: 29 U/L (ref 0–37)
Albumin: 3.6 g/dL (ref 3.5–5.2)
Total Protein: 7.1 g/dL (ref 6.0–8.3)

## 2010-09-16 LAB — MAGNESIUM: Magnesium: 2.1 mg/dL (ref 1.5–2.5)

## 2010-09-16 LAB — HEPATITIS C ANTIBODY: HCV Ab: NEGATIVE

## 2010-09-16 LAB — HEPATITIS A ANTIBODY, TOTAL: Hep A Total Ab: NEGATIVE

## 2010-09-16 LAB — HEPATITIS B CORE ANTIBODY, TOTAL: Hep B Core Total Ab: NEGATIVE

## 2010-09-16 LAB — PHOSPHORUS: Phosphorus: 3.8 mg/dL (ref 2.3–4.6)

## 2010-09-16 LAB — HEPATITIS B SURFACE ANTIGEN: Hepatitis B Surface Ag: NEGATIVE

## 2010-09-17 LAB — WET PREP, GENITAL: WBC, Wet Prep HPF POC: NONE SEEN

## 2010-09-17 LAB — DIFFERENTIAL
Basophils Absolute: 0 10*3/uL (ref 0.0–0.1)
Basophils Relative: 0 % (ref 0–1)
Eosinophils Absolute: 0.4 10*3/uL (ref 0.0–0.7)
Monocytes Absolute: 0.7 10*3/uL (ref 0.1–1.0)
Monocytes Relative: 8 % (ref 3–12)
Neutro Abs: 4.6 10*3/uL (ref 1.7–7.7)
Neutrophils Relative %: 54 % (ref 43–77)

## 2010-09-17 LAB — URINE CULTURE: Culture  Setup Time: 201110150020

## 2010-09-17 LAB — COMPREHENSIVE METABOLIC PANEL
ALT: 11 U/L (ref 0–35)
Alkaline Phosphatase: 51 U/L (ref 39–117)
BUN: 13 mg/dL (ref 6–23)
BUN: 9 mg/dL (ref 6–23)
CO2: 26 mEq/L (ref 19–32)
Calcium: 8.7 mg/dL (ref 8.4–10.5)
Chloride: 105 mEq/L (ref 96–112)
Chloride: 109 mEq/L (ref 96–112)
Creatinine, Ser: 0.8 mg/dL (ref 0.4–1.2)
GFR calc non Af Amer: >60 mL/min (ref >60)
Glucose, Bld: 100 mg/dL — ABNORMAL HIGH (ref 70–99)
Potassium: 3.7 mEq/L (ref 3.5–5.1)
Sodium: 139 mEq/L (ref 135–145)
Total Bilirubin: 0.3 mg/dL (ref 0.3–1.2)
Total Bilirubin: 0.3 mg/dL (ref 0.3–1.2)
Total Protein: 6.6 g/dL (ref 6.0–8.3)

## 2010-09-17 LAB — URINALYSIS, ROUTINE W REFLEX MICROSCOPIC
Glucose, UA: NEGATIVE mg/dL
Hgb urine dipstick: NEGATIVE
Ketones, ur: NEGATIVE mg/dL
Ketones, ur: NEGATIVE mg/dL
Nitrite: NEGATIVE
Protein, ur: NEGATIVE mg/dL
Protein, ur: NEGATIVE mg/dL
Urobilinogen, UA: 0.2 mg/dL (ref 0.0–1.0)
Urobilinogen, UA: 1 mg/dL (ref 0.0–1.0)

## 2010-09-17 LAB — CBC
HCT: 35 % — ABNORMAL LOW (ref 36.0–46.0)
Hemoglobin: 11.8 g/dL — ABNORMAL LOW (ref 12.0–15.0)
MCV: 82.2 fL (ref 78.0–100.0)
RBC: 4.26 MIL/uL (ref 3.87–5.11)
WBC: 8.5 10*3/uL (ref 4.0–10.5)

## 2010-09-17 LAB — LIPASE, BLOOD
Lipase: 128 U/L — ABNORMAL HIGH (ref 11–59)
Lipase: 380 U/L — ABNORMAL HIGH (ref 11–59)

## 2010-09-17 LAB — POCT PREGNANCY, URINE: Preg Test, Ur: NEGATIVE

## 2010-09-17 LAB — RAPID URINE DRUG SCREEN, HOSP PERFORMED
Barbiturates: NOT DETECTED
Benzodiazepines: NOT DETECTED

## 2010-09-17 LAB — GC/CHLAMYDIA PROBE AMP, GENITAL: GC Probe Amp, Genital: NEGATIVE

## 2010-09-18 LAB — POCT I-STAT, CHEM 8
Calcium, Ion: 1.19 mmol/L (ref 1.12–1.32)
Chloride: 106 mEq/L (ref 96–112)
Glucose, Bld: 87 mg/dL (ref 70–99)
HCT: 45 % (ref 36.0–46.0)
TCO2: 27 mmol/L (ref 0–100)

## 2010-09-20 ENCOUNTER — Inpatient Hospital Stay (HOSPITAL_COMMUNITY)
Admission: EM | Admit: 2010-09-20 | Discharge: 2010-09-25 | DRG: 440 | Disposition: A | Discharge status: Home / Self Care | Payer: Medicaid Other | Attending: Internal Medicine | Admitting: Internal Medicine

## 2010-09-20 ENCOUNTER — Emergency Department (HOSPITAL_COMMUNITY): Payer: Medicaid Other

## 2010-09-20 DIAGNOSIS — E669 Obesity, unspecified: Secondary | ICD-10-CM | POA: Diagnosis present

## 2010-09-20 DIAGNOSIS — K859 Acute pancreatitis without necrosis or infection, unspecified: Principal | ICD-10-CM | POA: Diagnosis present

## 2010-09-20 DIAGNOSIS — D649 Anemia, unspecified: Secondary | ICD-10-CM | POA: Diagnosis not present

## 2010-09-20 DIAGNOSIS — R7989 Other specified abnormal findings of blood chemistry: Secondary | ICD-10-CM | POA: Diagnosis not present

## 2010-09-20 DIAGNOSIS — E876 Hypokalemia: Secondary | ICD-10-CM | POA: Diagnosis not present

## 2010-09-20 DIAGNOSIS — Z79899 Other long term (current) drug therapy: Secondary | ICD-10-CM

## 2010-09-20 DIAGNOSIS — K861 Other chronic pancreatitis: Secondary | ICD-10-CM | POA: Diagnosis present

## 2010-09-20 HISTORY — DX: Acute pancreatitis without necrosis or infection, unspecified: K85.90

## 2010-09-20 HISTORY — DX: Acquired absence of other specified parts of digestive tract: Z90.49

## 2010-09-20 HISTORY — DX: Benign neoplasm of connective and other soft tissue, unspecified: D21.9

## 2010-09-20 LAB — COMPREHENSIVE METABOLIC PANEL
ALT: 46 U/L — ABNORMAL HIGH (ref 0–35)
ALT: 15 U/L (ref 0–35)
AST: 36 U/L (ref 0–37)
AST: 18 U/L (ref 0–37)
Albumin: 4.2 g/dL (ref 3.5–5.2)
Alkaline Phosphatase: 79 U/L (ref 39–117)
BUN: 13 mg/dL (ref 6–23)
BUN: 14 mg/dL (ref 6–23)
CO2: 25 mEq/L (ref 19–32)
Calcium: 9.4 mg/dL (ref 8.4–10.5)
Calcium: 9.6 mg/dL (ref 8.4–10.5)
Calcium: 8.8 mg/dL (ref 8.4–10.5)
Chloride: 108 mEq/L (ref 96–112)
Creatinine, Ser: 0.77 mg/dL (ref 0.4–1.2)
Creatinine, Ser: 0.64 mg/dL (ref 0.4–1.2)
GFR calc Af Amer: >60 mL/min (ref >60)
GFR calc Af Amer: >60 mL/min (ref >60)
GFR calc non Af Amer: >60 mL/min (ref >60)
Glucose, Bld: 93 mg/dL (ref 70–99)
Glucose, Bld: 94 mg/dL (ref 70–99)
Potassium: 4.7 mEq/L (ref 3.5–5.1)
Sodium: 140 mEq/L (ref 135–145)
Sodium: 141 mEq/L (ref 135–145)
Sodium: 140 mEq/L (ref 135–145)
Total Bilirubin: 0.4 mg/dL (ref 0.3–1.2)
Total Protein: 7.6 g/dL (ref 6.0–8.3)
Total Protein: 7.7 g/dL (ref 6.0–8.3)
Total Protein: 7.4 g/dL (ref 6.0–8.3)

## 2010-09-20 LAB — DIFFERENTIAL
Basophils Absolute: 0 10*3/uL (ref 0.0–0.1)
Basophils Absolute: 0 K/uL (ref 0.0–0.1)
Basophils Absolute: 0 K/uL (ref 0.0–0.1)
Basophils Relative: 0 % (ref 0–1)
Basophils Relative: 1 % (ref 0–1)
Eosinophils Absolute: 0.2 K/uL (ref 0.0–0.7)
Eosinophils Absolute: 0.2 K/uL (ref 0.0–0.7)
Eosinophils Absolute: 0.4 10*3/uL (ref 0.0–0.7)
Eosinophils Relative: 3 % (ref 0–5)
Eosinophils Relative: 3 % (ref 0–5)
Eosinophils Relative: 5 % (ref 0–5)
Eosinophils Relative: 5 % (ref 0–5)
Lymphocytes Relative: 23 % (ref 12–46)
Lymphocytes Relative: 26 % (ref 12–46)
Lymphocytes Relative: 31 % (ref 12–46)
Lymphocytes Relative: 35 % (ref 12–46)
Lymphs Abs: 1.8 K/uL (ref 0.7–4.0)
Lymphs Abs: 1.8 K/uL (ref 0.7–4.0)
Lymphs Abs: 2.2 10*3/uL (ref 0.7–4.0)
Monocytes Absolute: 0.5 K/uL (ref 0.1–1.0)
Monocytes Absolute: 0.5 K/uL (ref 0.1–1.0)
Monocytes Absolute: 0.7 10*3/uL (ref 0.1–1.0)
Monocytes Relative: 7 % (ref 3–12)
Monocytes Relative: 7 % (ref 3–12)
Monocytes Relative: 7 % (ref 3–12)
Monocytes Relative: 8 % (ref 3–12)
Neutro Abs: 4.5 K/uL (ref 1.7–7.7)
Neutro Abs: 4.7 10*3/uL (ref 1.7–7.7)
Neutro Abs: 5.5 K/uL (ref 1.7–7.7)
Neutrophils Relative %: 64 % (ref 43–77)
Neutrophils Relative %: 68 % (ref 43–77)

## 2010-09-20 LAB — CBC
HCT: 36.9 % (ref 36.0–46.0)
HCT: 37.8 % (ref 36.0–46.0)
HCT: 38.1 % (ref 36.0–46.0)
Hemoglobin: 12.2 g/dL (ref 12.0–15.0)
Hemoglobin: 12.7 g/dL (ref 12.0–15.0)
Hemoglobin: 12.7 g/dL (ref 12.0–15.0)
Hemoglobin: 12.7 g/dL (ref 12.0–15.0)
MCH: 26.2 pg (ref 26.0–34.0)
MCHC: 33.1 g/dL (ref 30.0–36.0)
MCHC: 33.4 g/dL (ref 30.0–36.0)
MCHC: 33.5 g/dL (ref 30.0–36.0)
MCHC: 33.7 g/dL (ref 30.0–36.0)
MCV: 79.4 fL (ref 78.0–100.0)
MCV: 82.3 fL (ref 78.0–100.0)
MCV: 83.3 fL (ref 78.0–100.0)
Platelets: 273 K/uL (ref 150–400)
Platelets: 290 K/uL (ref 150–400)
Platelets: 272 10*3/uL (ref 150–400)
RBC: 4.58 MIL/uL (ref 3.87–5.11)
RBC: 4.59 MIL/uL (ref 3.87–5.11)
RDW: 12 % (ref 11.5–15.5)
RDW: 12.5 % (ref 11.5–15.5)
RDW: 12.5 % (ref 11.5–15.5)
RDW: 12.8 % (ref 11.5–15.5)
WBC: 7 K/uL (ref 4.0–10.5)
WBC: 8.1 K/uL (ref 4.0–10.5)

## 2010-09-20 LAB — URINALYSIS, ROUTINE W REFLEX MICROSCOPIC
Bilirubin Urine: NEGATIVE
Bilirubin Urine: NEGATIVE
Bilirubin Urine: NEGATIVE
Glucose, UA: NEGATIVE mg/dL
Glucose, UA: NEGATIVE mg/dL
Glucose, UA: NEGATIVE mg/dL
Hgb urine dipstick: NEGATIVE
Hgb urine dipstick: NEGATIVE
Hgb urine dipstick: NEGATIVE
Ketones, ur: NEGATIVE mg/dL
Ketones, ur: NEGATIVE mg/dL
Ketones, ur: NEGATIVE mg/dL
Nitrite: NEGATIVE
Nitrite: NEGATIVE
Nitrite: NEGATIVE
Protein, ur: NEGATIVE mg/dL
Protein, ur: NEGATIVE mg/dL
Protein, ur: NEGATIVE mg/dL
Specific Gravity, Urine: 1.017 (ref 1.005–1.030)
Specific Gravity, Urine: 1.024 (ref 1.005–1.030)
Specific Gravity, Urine: 1.023 (ref 1.005–1.030)
Urobilinogen, UA: 0.2 mg/dL (ref 0.0–1.0)
Urobilinogen, UA: 0.2 mg/dL (ref 0.0–1.0)
Urobilinogen, UA: 0.2 mg/dL (ref 0.0–1.0)
pH: 7.5 (ref 5.0–8.0)
pH: 7.5 (ref 5.0–8.0)
pH: 8 (ref 5.0–8.0)

## 2010-09-20 LAB — URINE MICROSCOPIC-ADD ON

## 2010-09-20 LAB — POCT I-STAT, CHEM 8
BUN: 12 mg/dL (ref 6–23)
Creatinine, Ser: 0.8 mg/dL (ref 0.4–1.2)
Glucose, Bld: 86 mg/dL (ref 70–99)
Hemoglobin: 12.6 g/dL (ref 12.0–15.0)
Potassium: 4.2 mEq/L (ref 3.5–5.1)
Sodium: 137 mEq/L (ref 135–145)

## 2010-09-20 LAB — PREGNANCY, URINE: Preg Test, Ur: NEGATIVE

## 2010-09-20 LAB — HEPATIC FUNCTION PANEL
ALT: 40 U/L — ABNORMAL HIGH (ref 0–35)
AST: 24 U/L (ref 0–37)
Alkaline Phosphatase: 85 U/L (ref 39–117)
Bilirubin, Direct: <0.1 mg/dL (ref 0.0–0.3)
Total Protein: 7.6 g/dL (ref 6.0–8.3)

## 2010-09-20 LAB — LIPASE, BLOOD: Lipase: 38 U/L (ref 11–59)

## 2010-09-21 LAB — LIPASE, BLOOD
Lipase: 36 U/L (ref 11–59)
Lipase: 40 U/L (ref 11–59)
Lipase: 55 U/L (ref 11–59)
Lipase: 76 U/L — ABNORMAL HIGH (ref 11–59)
Lipase: 82 U/L — ABNORMAL HIGH (ref 11–59)

## 2010-09-21 LAB — COMPREHENSIVE METABOLIC PANEL
ALT: 12 U/L (ref 0–35)
ALT: 13 U/L (ref 0–35)
ALT: 14 U/L (ref 0–35)
ALT: 64 U/L — ABNORMAL HIGH (ref 0–35)
AST: 110 U/L — ABNORMAL HIGH (ref 0–37)
AST: 15 U/L (ref 0–37)
AST: 18 U/L (ref 0–37)
AST: 18 U/L (ref 0–37)
Albumin: 3.2 g/dL — ABNORMAL LOW (ref 3.5–5.2)
Albumin: 3.7 g/dL (ref 3.5–5.2)
Albumin: 3.8 g/dL (ref 3.5–5.2)
Albumin: 3.8 g/dL (ref 3.5–5.2)
Albumin: 4.3 g/dL (ref 3.5–5.2)
Alkaline Phosphatase: 58 U/L (ref 39–117)
Alkaline Phosphatase: 63 U/L (ref 39–117)
Alkaline Phosphatase: 65 U/L (ref 39–117)
Alkaline Phosphatase: 68 U/L (ref 39–117)
Alkaline Phosphatase: 72 U/L (ref 39–117)
BUN: 11 mg/dL (ref 6–23)
BUN: 13 mg/dL (ref 6–23)
BUN: 14 mg/dL (ref 6–23)
CO2: 24 mEq/L (ref 19–32)
CO2: 24 mEq/L (ref 19–32)
CO2: 25 mEq/L (ref 19–32)
CO2: 26 mEq/L (ref 19–32)
CO2: 29 mEq/L (ref 19–32)
Calcium: 8.6 mg/dL (ref 8.4–10.5)
Calcium: 8.9 mg/dL (ref 8.4–10.5)
Calcium: 9 mg/dL (ref 8.4–10.5)
Chloride: 109 mEq/L (ref 96–112)
Chloride: 110 mEq/L (ref 96–112)
Chloride: 111 mEq/L (ref 96–112)
Chloride: 111 mEq/L (ref 96–112)
Creatinine, Ser: 0.57 mg/dL (ref 0.4–1.2)
Creatinine, Ser: 0.68 mg/dL (ref 0.4–1.2)
Creatinine, Ser: 0.69 mg/dL (ref 0.4–1.2)
Creatinine, Ser: 0.71 mg/dL (ref 0.4–1.2)
Creatinine, Ser: 0.76 mg/dL (ref 0.4–1.2)
GFR calc Af Amer: >60 mL/min (ref >60)
GFR calc Af Amer: >60 mL/min (ref >60)
GFR calc Af Amer: >60 mL/min (ref >60)
GFR calc Af Amer: >60 mL/min (ref >60)
GFR calc non Af Amer: >60 mL/min (ref >60)
GFR calc non Af Amer: >60 mL/min (ref >60)
GFR calc non Af Amer: >60 mL/min (ref >60)
GFR calc non Af Amer: >60 mL/min (ref >60)
Glucose, Bld: 100 mg/dL — ABNORMAL HIGH (ref 70–99)
Glucose, Bld: 88 mg/dL (ref 70–99)
Glucose, Bld: 88 mg/dL (ref 70–99)
Glucose, Bld: 94 mg/dL (ref 70–99)
Potassium: 3.5 mEq/L (ref 3.5–5.1)
Potassium: 3.8 mEq/L (ref 3.5–5.1)
Potassium: 3.8 mEq/L (ref 3.5–5.1)
Potassium: 4.4 mEq/L (ref 3.5–5.1)
Sodium: 138 mEq/L (ref 135–145)
Sodium: 139 mEq/L (ref 135–145)
Sodium: 140 mEq/L (ref 135–145)
Sodium: 144 mEq/L (ref 135–145)
Total Bilirubin: 0.5 mg/dL (ref 0.3–1.2)
Total Bilirubin: 0.5 mg/dL (ref 0.3–1.2)
Total Bilirubin: 0.5 mg/dL (ref 0.3–1.2)
Total Bilirubin: 0.5 mg/dL (ref 0.3–1.2)
Total Protein: 7.4 g/dL (ref 6.0–8.3)
Total Protein: 7.5 g/dL (ref 6.0–8.3)
Total Protein: 8.4 g/dL — ABNORMAL HIGH (ref 6.0–8.3)

## 2010-09-21 LAB — CBC
HCT: 35.7 % — ABNORMAL LOW (ref 36.0–46.0)
HCT: 35.9 % — ABNORMAL LOW (ref 36.0–46.0)
HCT: 38.8 % (ref 36.0–46.0)
Hemoglobin: 12.3 g/dL (ref 12.0–15.0)
Hemoglobin: 13.4 g/dL (ref 12.0–15.0)
MCHC: 34.4 g/dL (ref 30.0–36.0)
MCHC: 34.5 g/dL (ref 30.0–36.0)
MCV: 80.1 fL (ref 78.0–100.0)
MCV: 81.7 fL (ref 78.0–100.0)
MCV: 82.6 fL (ref 78.0–100.0)
MCV: 82.8 fL (ref 78.0–100.0)
MCV: 84 fL (ref 78.0–100.0)
Platelets: 235 10*3/uL (ref 150–400)
Platelets: 240 10*3/uL (ref 150–400)
Platelets: 285 10*3/uL (ref 150–400)
Platelets: 289 10*3/uL (ref 150–400)
Platelets: 295 10*3/uL (ref 150–400)
Platelets: 316 10*3/uL (ref 150–400)
RBC: 4.08 MIL/uL (ref 3.87–5.11)
RBC: 4.36 MIL/uL (ref 3.87–5.11)
RBC: 4.37 MIL/uL (ref 3.87–5.11)
RBC: 4.41 MIL/uL (ref 3.87–5.11)
RBC: 4.51 MIL/uL (ref 3.87–5.11)
RDW: 12.3 % (ref 11.5–15.5)
RDW: 12.7 % (ref 11.5–15.5)
WBC: 7.9 10*3/uL (ref 4.0–10.5)
WBC: 16.6 10*3/uL — ABNORMAL HIGH (ref 4.0–10.5)
WBC: 8.6 10*3/uL (ref 4.0–10.5)
WBC: 9.3 10*3/uL (ref 4.0–10.5)

## 2010-09-21 LAB — URINALYSIS, ROUTINE W REFLEX MICROSCOPIC
Bilirubin Urine: NEGATIVE
Bilirubin Urine: NEGATIVE
Bilirubin Urine: NEGATIVE
Bilirubin Urine: NEGATIVE
Glucose, UA: NEGATIVE mg/dL
Glucose, UA: NEGATIVE mg/dL
Glucose, UA: NEGATIVE mg/dL
Hgb urine dipstick: NEGATIVE
Nitrite: NEGATIVE
Nitrite: NEGATIVE
Protein, ur: NEGATIVE mg/dL
Protein, ur: NEGATIVE mg/dL
Specific Gravity, Urine: 1.025 (ref 1.005–1.030)
Specific Gravity, Urine: 1.025 (ref 1.005–1.030)
Specific Gravity, Urine: 1.027 (ref 1.005–1.030)
Specific Gravity, Urine: 1.031 — ABNORMAL HIGH (ref 1.005–1.030)
Specific Gravity, Urine: 1.031 — ABNORMAL HIGH (ref 1.005–1.030)
Specific Gravity, Urine: 1.039 — ABNORMAL HIGH (ref 1.005–1.030)
Urobilinogen, UA: 1 mg/dL (ref 0.0–1.0)
Urobilinogen, UA: 0.2 mg/dL (ref 0.0–1.0)
Urobilinogen, UA: 0.2 mg/dL (ref 0.0–1.0)
Urobilinogen, UA: 0.2 mg/dL (ref 0.0–1.0)
pH: 5.5 (ref 5.0–8.0)
pH: 6 (ref 5.0–8.0)
pH: 6 (ref 5.0–8.0)
pH: 6.5 (ref 5.0–8.0)

## 2010-09-21 LAB — POCT I-STAT, CHEM 8
BUN: 11 mg/dL (ref 6–23)
BUN: 12 mg/dL (ref 6–23)
Calcium, Ion: 1.07 mmol/L — ABNORMAL LOW (ref 1.12–1.32)
Calcium, Ion: 1.12 mmol/L (ref 1.12–1.32)
Chloride: 107 mEq/L (ref 96–112)
Chloride: 109 mEq/L (ref 96–112)
Creatinine, Ser: 0.8 mg/dL (ref 0.4–1.2)
Glucose, Bld: 98 mg/dL (ref 70–99)
HCT: 38 % (ref 36.0–46.0)
HCT: 40 % (ref 36.0–46.0)
Hemoglobin: 13.3 g/dL (ref 12.0–15.0)
Potassium: 4.1 mEq/L (ref 3.5–5.1)
Sodium: 141 mEq/L (ref 135–145)
Sodium: 141 mEq/L (ref 135–145)
TCO2: 25 mmol/L (ref 0–100)

## 2010-09-21 LAB — URINE MICROSCOPIC-ADD ON

## 2010-09-21 LAB — DIFFERENTIAL
Basophils Absolute: 0 10*3/uL (ref 0.0–0.1)
Basophils Absolute: 0.1 10*3/uL (ref 0.0–0.1)
Basophils Relative: 0 % (ref 0–1)
Basophils Relative: 1 % (ref 0–1)
Eosinophils Absolute: 0.3 10*3/uL (ref 0.0–0.7)
Eosinophils Absolute: 0.3 10*3/uL (ref 0.0–0.7)
Eosinophils Absolute: 0.4 10*3/uL (ref 0.0–0.7)
Eosinophils Absolute: 0.4 10*3/uL (ref 0.0–0.7)
Eosinophils Relative: 1 % (ref 0–5)
Eosinophils Relative: 3 % (ref 0–5)
Eosinophils Relative: 4 % (ref 0–5)
Eosinophils Relative: 4 % (ref 0–5)
Eosinophils Relative: 5 % (ref 0–5)
Lymphocytes Relative: 10 % — ABNORMAL LOW (ref 12–46)
Lymphocytes Relative: 27 % (ref 12–46)
Lymphocytes Relative: 29 % (ref 12–46)
Lymphocytes Relative: 30 % (ref 12–46)
Lymphocytes Relative: 32 % (ref 12–46)
Lymphs Abs: 1.7 10*3/uL (ref 0.7–4.0)
Lymphs Abs: 2.6 10*3/uL (ref 0.7–4.0)
Lymphs Abs: 2.8 10*3/uL (ref 0.7–4.0)
Lymphs Abs: 3 10*3/uL (ref 0.7–4.0)
Monocytes Absolute: 0.8 10*3/uL (ref 0.1–1.0)
Monocytes Absolute: 0.7 10*3/uL (ref 0.1–1.0)
Monocytes Absolute: 0.7 10*3/uL (ref 0.1–1.0)
Monocytes Relative: 10 % (ref 3–12)
Monocytes Relative: 7 % (ref 3–12)
Monocytes Relative: 8 % (ref 3–12)
Monocytes Relative: 8 % (ref 3–12)
Neutro Abs: 7.2 10*3/uL (ref 1.7–7.7)
Neutrophils Relative %: 49 % (ref 43–77)
Neutrophils Relative %: 63 % (ref 43–77)
Neutrophils Relative %: 78 % — ABNORMAL HIGH (ref 43–77)
Neutrophils Relative %: 55 % (ref 43–77)

## 2010-09-21 LAB — HEPATIC FUNCTION PANEL
ALT: 17 U/L (ref 0–35)
Albumin: 3.8 g/dL (ref 3.5–5.2)
Alkaline Phosphatase: 58 U/L (ref 39–117)
Total Protein: 7.3 g/dL (ref 6.0–8.3)

## 2010-09-21 LAB — RAPID STREP SCREEN (MED CTR MEBANE ONLY): Streptococcus, Group A Screen (Direct): NEGATIVE

## 2010-09-21 LAB — POCT PREGNANCY, URINE: Preg Test, Ur: NEGATIVE

## 2010-09-21 LAB — PREGNANCY, URINE: Preg Test, Ur: NEGATIVE

## 2010-09-22 ENCOUNTER — Inpatient Hospital Stay (HOSPITAL_COMMUNITY): Payer: Medicaid Other

## 2010-09-22 ENCOUNTER — Encounter (HOSPITAL_COMMUNITY): Payer: Self-pay | Admitting: Radiology

## 2010-09-22 LAB — POCT I-STAT, CHEM 8
BUN: 16 mg/dL (ref 6–23)
Calcium, Ion: 1.16 mmol/L (ref 1.12–1.32)
Chloride: 106 meq/L (ref 96–112)
Creatinine, Ser: 0.7 mg/dL (ref 0.4–1.2)
Glucose, Bld: 94 mg/dL (ref 70–99)
HCT: 38 % (ref 36.0–46.0)
HCT: 39 % (ref 36.0–46.0)
Hemoglobin: 12.9 g/dL (ref 12.0–15.0)
Hemoglobin: 13.3 g/dL (ref 12.0–15.0)
Potassium: 4.2 meq/L (ref 3.5–5.1)
Sodium: 140 mEq/L (ref 135–145)
Sodium: 142 meq/L (ref 135–145)
TCO2: 23 mmol/L (ref 0–100)
TCO2: 27 mmol/L (ref 0–100)

## 2010-09-22 LAB — CBC
HCT: 35.8 % — ABNORMAL LOW (ref 36.0–46.0)
HCT: 36.8 % (ref 36.0–46.0)
HCT: 36 % (ref 36.0–46.0)
Hemoglobin: 12.2 g/dL (ref 12.0–15.0)
Hemoglobin: 12.9 g/dL (ref 12.0–15.0)
Hemoglobin: 12.5 g/dL (ref 12.0–15.0)
MCH: 27.2 pg (ref 26.0–34.0)
MCHC: 34.1 g/dL (ref 30.0–36.0)
MCHC: 34.9 g/dL (ref 30.0–36.0)
MCHC: 34.8 g/dL (ref 30.0–36.0)
MCV: 79.9 fL (ref 78.0–100.0)
MCV: 82.8 fL (ref 78.0–100.0)
MCV: 83.4 fL (ref 78.0–100.0)
Platelets: 245 K/uL (ref 150–400)
Platelets: 243 K/uL (ref 150–400)
RBC: 4.48 MIL/uL (ref 3.87–5.11)
RBC: 4.45 MIL/uL (ref 3.87–5.11)
RDW: 12 % (ref 11.5–15.5)
RDW: 11.8 % (ref 11.5–15.5)
RDW: 11.9 % (ref 11.5–15.5)
WBC: 7.8 K/uL (ref 4.0–10.5)
WBC: 8.5 K/uL (ref 4.0–10.5)

## 2010-09-22 LAB — COMPREHENSIVE METABOLIC PANEL WITH GFR
ALT: 13 U/L (ref 0–35)
AST: 18 U/L (ref 0–37)
Albumin: 3.8 g/dL (ref 3.5–5.2)
Alkaline Phosphatase: 58 U/L (ref 39–117)
BUN: 11 mg/dL (ref 6–23)
CO2: 25 meq/L (ref 19–32)
Calcium: 8.9 mg/dL (ref 8.4–10.5)
Chloride: 110 meq/L (ref 96–112)
Creatinine, Ser: 0.62 mg/dL (ref 0.4–1.2)
GFR calc non Af Amer: >60 mL/min
Glucose, Bld: 94 mg/dL (ref 70–99)
Potassium: 3.5 meq/L (ref 3.5–5.1)
Sodium: 140 meq/L (ref 135–145)
Total Bilirubin: 0.5 mg/dL (ref 0.3–1.2)
Total Protein: 7 g/dL (ref 6.0–8.3)

## 2010-09-22 LAB — COMPREHENSIVE METABOLIC PANEL
BUN: 10 mg/dL (ref 6–23)
CO2: 27 mEq/L (ref 19–32)
Calcium: 8.7 mg/dL (ref 8.4–10.5)
Calcium: 8.8 mg/dL (ref 8.4–10.5)
Creatinine, Ser: 0.63 mg/dL (ref 0.4–1.2)
Creatinine, Ser: 0.8 mg/dL (ref 0.4–1.2)
GFR calc non Af Amer: >60 mL/min (ref >60)
Glucose, Bld: 73 mg/dL (ref 70–99)
Glucose, Bld: 85 mg/dL (ref 70–99)
Sodium: 140 mEq/L (ref 135–145)
Total Protein: 7.2 g/dL (ref 6.0–8.3)

## 2010-09-22 LAB — LIPASE, BLOOD
Lipase: 32 U/L (ref 11–59)
Lipase: 44 U/L (ref 11–59)
Lipase: 43 U/L (ref 11–59)

## 2010-09-22 LAB — URINALYSIS, ROUTINE W REFLEX MICROSCOPIC
Bilirubin Urine: NEGATIVE
Bilirubin Urine: NEGATIVE
Glucose, UA: NEGATIVE mg/dL
Glucose, UA: NEGATIVE mg/dL
Hgb urine dipstick: NEGATIVE
Ketones, ur: NEGATIVE mg/dL
Ketones, ur: 15 mg/dL — AB
Leukocytes, UA: NEGATIVE
Leukocytes, UA: NEGATIVE
Nitrite: NEGATIVE
Nitrite: NEGATIVE
Nitrite: NEGATIVE
Protein, ur: NEGATIVE mg/dL
Protein, ur: NEGATIVE mg/dL
Protein, ur: NEGATIVE mg/dL
Specific Gravity, Urine: 1.024 (ref 1.005–1.030)
Specific Gravity, Urine: 1.031 — ABNORMAL HIGH (ref 1.005–1.030)
Specific Gravity, Urine: 1.028 (ref 1.005–1.030)
Urobilinogen, UA: 0.2 mg/dL (ref 0.0–1.0)
Urobilinogen, UA: 0.2 mg/dL (ref 0.0–1.0)
Urobilinogen, UA: 1 mg/dL (ref 0.0–1.0)
Urobilinogen, UA: 1 mg/dL (ref 0.0–1.0)
pH: 8 (ref 5.0–8.0)
pH: 7.5 (ref 5.0–8.0)

## 2010-09-22 LAB — DIFFERENTIAL
Basophils Relative: 1 % (ref 0–1)
Eosinophils Absolute: 0.4 10*3/uL (ref 0.0–0.7)
Eosinophils Relative: 5 % (ref 0–5)
Lymphocytes Relative: 35 % (ref 12–46)
Lymphs Abs: 2.8 10*3/uL (ref 0.7–4.0)
Monocytes Relative: 8 % (ref 3–12)
Monocytes Relative: 7 % (ref 3–12)
Neutro Abs: 4.3 10*3/uL (ref 1.7–7.7)
Neutrophils Relative %: 54 % (ref 43–77)
Neutrophils Relative %: 54 % (ref 43–77)

## 2010-09-22 LAB — URINE MICROSCOPIC-ADD ON

## 2010-09-22 LAB — WET PREP, GENITAL
Clue Cells Wet Prep HPF POC: NONE SEEN
Trich, Wet Prep: NONE SEEN
WBC, Wet Prep HPF POC: NONE SEEN
Yeast Wet Prep HPF POC: NONE SEEN

## 2010-09-22 LAB — GC/CHLAMYDIA PROBE AMP, GENITAL
Chlamydia, DNA Probe: NEGATIVE
GC Probe Amp, Genital: NEGATIVE

## 2010-09-22 LAB — POCT PREGNANCY, URINE: Preg Test, Ur: NEGATIVE

## 2010-09-22 MED ORDER — IOHEXOL 300 MG/ML  SOLN
100.0000 mL | Freq: Once | INTRAMUSCULAR | Status: AC | PRN
  Administered 2010-09-22: 100 mL via INTRAVENOUS

## 2010-09-23 LAB — GIARDIA/CRYPTOSPORIDIUM SCREEN(EIA): Cryptosporidium Screen (EIA): NEGATIVE

## 2010-09-23 LAB — COMPREHENSIVE METABOLIC PANEL
ALT: 73 U/L — ABNORMAL HIGH (ref 0–35)
ALT: 100 U/L — ABNORMAL HIGH (ref 0–35)
ALT: 50 U/L — ABNORMAL HIGH (ref 0–35)
ALT: 53 U/L — ABNORMAL HIGH (ref 0–35)
AST: 67 U/L — ABNORMAL HIGH (ref 0–37)
AST: 30 U/L (ref 0–37)
AST: 36 U/L (ref 0–37)
AST: 69 U/L — ABNORMAL HIGH (ref 0–37)
Albumin: 3.9 g/dL (ref 3.5–5.2)
Albumin: 3.7 g/dL (ref 3.5–5.2)
Albumin: 3.9 g/dL (ref 3.5–5.2)
Alkaline Phosphatase: 86 U/L (ref 39–117)
Alkaline Phosphatase: 64 U/L (ref 39–117)
Alkaline Phosphatase: 66 U/L (ref 39–117)
Alkaline Phosphatase: 77 U/L (ref 39–117)
Alkaline Phosphatase: 81 U/L (ref 39–117)
Alkaline Phosphatase: 82 U/L (ref 39–117)
BUN: 11 mg/dL (ref 6–23)
BUN: 3 mg/dL — ABNORMAL LOW (ref 6–23)
BUN: 3 mg/dL — ABNORMAL LOW (ref 6–23)
CO2: 26 mEq/L (ref 19–32)
CO2: 27 mEq/L (ref 19–32)
CO2: 27 mEq/L (ref 19–32)
CO2: 28 mEq/L (ref 19–32)
Calcium: 9 mg/dL (ref 8.4–10.5)
Calcium: 8.6 mg/dL (ref 8.4–10.5)
Calcium: 8.9 mg/dL (ref 8.4–10.5)
Calcium: 9.4 mg/dL (ref 8.4–10.5)
Chloride: 108 mEq/L (ref 96–112)
Creatinine, Ser: 0.62 mg/dL (ref 0.4–1.2)
Creatinine, Ser: 0.66 mg/dL (ref 0.4–1.2)
GFR calc Af Amer: >60 mL/min (ref >60)
GFR calc Af Amer: >60 mL/min (ref >60)
GFR calc Af Amer: >60 mL/min (ref >60)
GFR calc non Af Amer: >60 mL/min (ref >60)
GFR calc non Af Amer: >60 mL/min (ref >60)
GFR calc non Af Amer: >60 mL/min (ref >60)
GFR calc non Af Amer: >60 mL/min (ref >60)
Glucose, Bld: 92 mg/dL (ref 70–99)
Glucose, Bld: 86 mg/dL (ref 70–99)
Glucose, Bld: 74 mg/dL (ref 70–99)
Glucose, Bld: 78 mg/dL (ref 70–99)
Glucose, Bld: 87 mg/dL (ref 70–99)
Potassium: 3.2 mEq/L — ABNORMAL LOW (ref 3.5–5.1)
Potassium: 3.6 mEq/L (ref 3.5–5.1)
Potassium: 3.8 mEq/L (ref 3.5–5.1)
Potassium: 3.9 mEq/L (ref 3.5–5.1)
Potassium: 4.4 mEq/L (ref 3.5–5.1)
Sodium: 136 mEq/L (ref 135–145)
Sodium: 139 mEq/L (ref 135–145)
Sodium: 140 mEq/L (ref 135–145)
Sodium: 141 mEq/L (ref 135–145)
Total Bilirubin: 0.8 mg/dL (ref 0.3–1.2)
Total Protein: 6.8 g/dL (ref 6.0–8.3)
Total Protein: 7.4 g/dL (ref 6.0–8.3)
Total Protein: 7.1 g/dL (ref 6.0–8.3)
Total Protein: 7.4 g/dL (ref 6.0–8.3)

## 2010-09-23 LAB — CBC
HCT: 34.9 % — ABNORMAL LOW (ref 36.0–46.0)
HCT: 35.4 % — ABNORMAL LOW (ref 36.0–46.0)
HCT: 34.8 % — ABNORMAL LOW (ref 36.0–46.0)
Hemoglobin: 11.9 g/dL — ABNORMAL LOW (ref 12.0–15.0)
MCHC: 34.8 g/dL (ref 30.0–36.0)
MCHC: 33.6 g/dL (ref 30.0–36.0)
MCHC: 34.3 g/dL (ref 30.0–36.0)
MCV: 77.9 fL — ABNORMAL LOW (ref 78.0–100.0)
MCV: 83.9 fL (ref 78.0–100.0)
Platelets: 313 10*3/uL (ref 150–400)
Platelets: 305 10*3/uL (ref 150–400)
Platelets: ADEQUATE 10*3/uL (ref 150–400)
RBC: 4.48 MIL/uL (ref 3.87–5.11)
RBC: 4.21 MIL/uL (ref 3.87–5.11)
RDW: 12.3 % (ref 11.5–15.5)
RDW: 11.6 % (ref 11.5–15.5)
RDW: 11.9 % (ref 11.5–15.5)
WBC: 7.1 10*3/uL (ref 4.0–10.5)
WBC: 10.8 10*3/uL — ABNORMAL HIGH (ref 4.0–10.5)

## 2010-09-23 LAB — LIPASE, BLOOD
Lipase: 48 U/L (ref 11–59)
Lipase: 24 U/L (ref 11–59)
Lipase: 28 U/L (ref 11–59)

## 2010-09-23 LAB — BASIC METABOLIC PANEL
BUN: 3 mg/dL — ABNORMAL LOW (ref 6–23)
Chloride: 104 mEq/L (ref 96–112)
Potassium: 4 mEq/L (ref 3.5–5.1)

## 2010-09-23 LAB — DIFFERENTIAL
Lymphocytes Relative: 39 % (ref 12–46)
Lymphs Abs: 2.9 10*3/uL (ref 0.7–4.0)
Monocytes Absolute: 0.5 10*3/uL (ref 0.1–1.0)
Monocytes Relative: 7 % (ref 3–12)
Neutro Abs: 3.6 10*3/uL (ref 1.7–7.7)
Neutrophils Relative %: 48 % (ref 43–77)

## 2010-09-23 LAB — CLOSTRIDIUM DIFFICILE EIA: C difficile Toxins A+B, EIA: NEGATIVE

## 2010-09-23 LAB — STOOL CULTURE

## 2010-09-24 LAB — COMPREHENSIVE METABOLIC PANEL
ALT: 59 U/L — ABNORMAL HIGH (ref 0–35)
AST: 46 U/L — ABNORMAL HIGH (ref 0–37)
Alkaline Phosphatase: 87 U/L (ref 39–117)
CO2: 27 mEq/L (ref 19–32)
Chloride: 105 mEq/L (ref 96–112)
Creatinine, Ser: 0.64 mg/dL (ref 0.4–1.2)
GFR calc Af Amer: >60 mL/min (ref >60)
GFR calc non Af Amer: >60 mL/min (ref >60)
Sodium: 141 mEq/L (ref 135–145)
Total Bilirubin: 0.6 mg/dL (ref 0.3–1.2)

## 2010-09-25 LAB — DIFFERENTIAL
Basophils Relative: 0 % (ref 0–1)
Basophils Relative: 1 % (ref 0–1)
Eosinophils Absolute: 0.3 10*3/uL (ref 0.0–0.7)
Eosinophils Absolute: 0.4 10*3/uL (ref 0.0–0.7)
Eosinophils Absolute: 0.5 10*3/uL (ref 0.0–0.7)
Eosinophils Relative: 3 % (ref 0–5)
Eosinophils Relative: 5 % (ref 0–5)
Lymphocytes Relative: 39 % (ref 12–46)
Lymphs Abs: 2.6 10*3/uL (ref 0.7–4.0)
Lymphs Abs: 3.4 10*3/uL (ref 0.7–4.0)
Monocytes Relative: 7 % (ref 3–12)
Monocytes Relative: 8 % (ref 3–12)
Neutrophils Relative %: 46 % (ref 43–77)
Neutrophils Relative %: 53 % (ref 43–77)

## 2010-09-25 LAB — COMPREHENSIVE METABOLIC PANEL
ALT: 19 U/L (ref 0–35)
ALT: 32 U/L (ref 0–35)
ALT: 34 U/L (ref 0–35)
AST: 18 U/L (ref 0–37)
AST: 35 U/L (ref 0–37)
Alkaline Phosphatase: 77 U/L (ref 39–117)
Alkaline Phosphatase: 84 U/L (ref 39–117)
CO2: 26 mEq/L (ref 19–32)
CO2: 27 mEq/L (ref 19–32)
Calcium: 8.9 mg/dL (ref 8.4–10.5)
Chloride: 107 mEq/L (ref 96–112)
GFR calc Af Amer: >60 mL/min (ref >60)
GFR calc Af Amer: >60 mL/min (ref >60)
GFR calc non Af Amer: >60 mL/min (ref >60)
GFR calc non Af Amer: >60 mL/min (ref >60)
Glucose, Bld: 88 mg/dL (ref 70–99)
Glucose, Bld: 96 mg/dL (ref 70–99)
Potassium: 3.4 mEq/L — ABNORMAL LOW (ref 3.5–5.1)
Potassium: 4.2 mEq/L (ref 3.5–5.1)
Sodium: 138 mEq/L (ref 135–145)
Sodium: 140 mEq/L (ref 135–145)
Sodium: 141 mEq/L (ref 135–145)
Total Bilirubin: 0.4 mg/dL (ref 0.3–1.2)
Total Protein: 7.5 g/dL (ref 6.0–8.3)
Total Protein: 7.8 g/dL (ref 6.0–8.3)

## 2010-09-25 LAB — LIPASE, BLOOD: Lipase: 142 U/L — ABNORMAL HIGH (ref 11–59)

## 2010-09-25 LAB — URINALYSIS, ROUTINE W REFLEX MICROSCOPIC
Bilirubin Urine: NEGATIVE
Bilirubin Urine: NEGATIVE
Glucose, UA: NEGATIVE mg/dL
Ketones, ur: NEGATIVE mg/dL
Ketones, ur: NEGATIVE mg/dL
Nitrite: NEGATIVE
Specific Gravity, Urine: 1.011 (ref 1.005–1.030)
Specific Gravity, Urine: 1.026 (ref 1.005–1.030)
Urobilinogen, UA: 0.2 mg/dL (ref 0.0–1.0)
pH: 6 (ref 5.0–8.0)

## 2010-09-25 LAB — CBC
Hemoglobin: 13.6 g/dL (ref 12.0–15.0)
Hemoglobin: 13.8 g/dL (ref 12.0–15.0)
MCHC: 34.1 g/dL (ref 30.0–36.0)
MCHC: 34.5 g/dL (ref 30.0–36.0)
Platelets: 280 10*3/uL (ref 150–400)
RBC: 4.75 MIL/uL (ref 3.87–5.11)
RBC: 4.79 MIL/uL (ref 3.87–5.11)
RDW: 12.5 % (ref 11.5–15.5)
WBC: 7.5 10*3/uL (ref 4.0–10.5)

## 2010-09-25 LAB — POCT I-STAT, CHEM 8
Chloride: 103 mEq/L (ref 96–112)
Glucose, Bld: 112 mg/dL — ABNORMAL HIGH (ref 70–99)
HCT: 37 % (ref 36.0–46.0)
Hemoglobin: 12.6 g/dL (ref 12.0–15.0)
Potassium: 3.7 mEq/L (ref 3.5–5.1)
Sodium: 138 mEq/L (ref 135–145)

## 2010-09-25 LAB — AMYLASE: Amylase: 110 U/L — ABNORMAL HIGH (ref 0–105)

## 2010-09-25 LAB — RAPID URINE DRUG SCREEN, HOSP PERFORMED: Tetrahydrocannabinol: NOT DETECTED

## 2010-09-25 LAB — POCT PREGNANCY, URINE: Preg Test, Ur: NEGATIVE

## 2010-09-26 NOTE — Discharge Summary (Signed)
[name], [name]               ACCOUNT NO.:  [id number]  MEDICAL RECORD NO.:  [id number]           PATIENT TYPE:  I  LOCATION:  5125                         FACILITY:  [facility]  PHYSICIAN:  Andreas Blower, MD       DATE OF BIRTH:  25-Apr-1981  DATE OF ADMISSION:  09/20/2010 DATE OF DISCHARGE:  09/25/2010                              DISCHARGE SUMMARY   PRIMARY CARE PHYSICIAN:  [facility].  The patient's gastroenterologist have been multiple [facility] physicians.  DISCHARGE DIAGNOSES: 1. Recurrent pancreatitis. 2. Chronic abdominal pain. 3. Status post cholecystectomy. 4. History of asthma, stable. 5. Hypokalemia. 6. History of abnormal liver function tests, etiology unclear. 7. Obesity. 8. History of fibroids. 9. Status post hysterectomy. 10.History of exploratory laparoscopy x2 for lysis of adhesions, ERCP,     MRCP in 2010 negative. 11.History of pain medication seeking behavior in the past.  DISCHARGE MEDICATIONS: 1. Oxycodone IR 5 mg every 6 hours as needed for pain.  The patient     given total of 20 tablets. 2. Zofran 4 mg every 6 hours as needed for nausea. 3. Estradiol 2 mg 1 tablet p.o. daily. 4. Multivitamin over the counter 1 tablet p.o. daily.  BRIEF ADMITTING HISTORY AND PHYSICAL:  [name] is a 30 year old African American female with history of chronic abdominal pain and recurrent pancreatitis who presented with right upper quadrant abdominal pain, which started on September 06, 2010.  RADIOLOGY/IMAGING: 1. The patient had CT of the abdomen and pelvis with contrast, which     shows no acute abdominal pelvic findings.  No evidence of     pancreatitis.  Stable mild biliary dilatation, status post     cholecystectomy.  Interval development of hepatic steatosis. 2. Stable probable Bartholin cyst on the left.  CONSULTATIONS OBTAINED:  Dr. Randa Evens from [facility] evaluated the patient during the course of the hospital stay.  LABORATORY DATA:  CBC shows a  white count of 7.1, hemoglobin 11.9, hematocrit 34.9, platelet count 257.  Electrolytes normal with BUN of 3, creatinine 0.64.  Liver function tests normal except AST is 46, ALT is 59.  Lipase at the time of discharge was normal at 45, peaked at 250. During the course hospital stay, MRSA by PCR was negative.  HOSPITAL COURSE BY PROBLEM: 1. Recurrent pancreatitis.  Initially, the patient was made n.p.o. and     was aggressively hydrated with IV fluids.  Given history of     multiple recurrent pancreatitis, Dr. Randa Evens from GI was consulted.     Dr. Randa Evens after looking at her history over the last 3 years     thought that given the patient has had multiple imaging and workup     here that there was not much that we could offer for the patient     other than pain medications.  The patient has an appointment with     Dr. Gwinda Passe at [facility] as previously scheduled     on October 28, 2010.  The patient was instructed to strongly keep     that appointment. 2. Nausea and diarrhea,  resolved during the course of the hospital     stay and the patient was initially on clear liquid diet and her     diet was advanced as tolerated. 3. Elevated LFTs, stable.  Maybe due to obesity as noted on CT of the     abdomen and pelvis, which showed hepatic steatosis. 4. History of asthma, stable. 5. Hypokalemia, resolved with replacement. 6. History of questionable pain medication seeking behavior.  During     my evaluation of the patient, the patient was very insistent and     was very particular about when she was receiving her pain     medications and what doses.  Even though the patient complained of     pain on my evaluation, she appeared comfortable.  When I changed     her pain medications to oral pain medications, the patient wanted     to be discharged even though I had counseled her about if she is     truly in pain unless she takes oral pain medications and see how     she does, I  cannot adjust her pain medications to acheive     good pain control.  She understood all of this, but wanted to be     discharged home on oral pain medications.  Disposition and Followup: Followup with [facility] is 1-2 weeks. Followup with Dr. Gwinda Passe at [facility] on April 25th, 2012 for abdominal pain.  Time spent on discharge talking to the patient, consultants, and coordinating care was 35 minutes.     Andreas Blower, MD     SR/MEDQ  D:  09/25/2010  T:  09/26/2010  Job:  [id number]  Electronically Signed by Wardell Heath Malaki Koury  on 09/26/2010 06:16:10 PM

## 2010-09-28 LAB — RAPID URINE DRUG SCREEN, HOSP PERFORMED
Amphetamines: NOT DETECTED
Amphetamines: NOT DETECTED
Barbiturates: NOT DETECTED
Tetrahydrocannabinol: NOT DETECTED
Tetrahydrocannabinol: NOT DETECTED

## 2010-09-28 LAB — COMPREHENSIVE METABOLIC PANEL
ALT: 21 U/L (ref 0–35)
ALT: 254 U/L — ABNORMAL HIGH (ref 0–35)
ALT: 259 U/L — ABNORMAL HIGH (ref 0–35)
ALT: 32 U/L (ref 0–35)
AST: 19 U/L (ref 0–37)
AST: 203 U/L — ABNORMAL HIGH (ref 0–37)
AST: 495 U/L — ABNORMAL HIGH (ref 0–37)
Albumin: 3.5 g/dL (ref 3.5–5.2)
Albumin: 3.9 g/dL (ref 3.5–5.2)
Albumin: 4 g/dL (ref 3.5–5.2)
Alkaline Phosphatase: 75 U/L (ref 39–117)
Alkaline Phosphatase: 81 U/L (ref 39–117)
BUN: 13 mg/dL (ref 6–23)
BUN: <1 mg/dL — ABNORMAL LOW (ref 6–23)
CO2: 19 mEq/L (ref 19–32)
CO2: 24 mEq/L (ref 19–32)
CO2: 24 mEq/L (ref 19–32)
CO2: 26 mEq/L (ref 19–32)
CO2: 28 mEq/L (ref 19–32)
Calcium: 8 mg/dL — ABNORMAL LOW (ref 8.4–10.5)
Calcium: 8.3 mg/dL — ABNORMAL LOW (ref 8.4–10.5)
Calcium: 9 mg/dL (ref 8.4–10.5)
Calcium: 9.2 mg/dL (ref 8.4–10.5)
Chloride: 105 mEq/L (ref 96–112)
Chloride: 110 mEq/L (ref 96–112)
Creatinine, Ser: 0.62 mg/dL (ref 0.4–1.2)
Creatinine, Ser: 0.64 mg/dL (ref 0.4–1.2)
Creatinine, Ser: 0.72 mg/dL (ref 0.4–1.2)
GFR calc Af Amer: >60 mL/min (ref >60)
GFR calc Af Amer: >60 mL/min (ref >60)
GFR calc Af Amer: >60 mL/min (ref >60)
GFR calc non Af Amer: >60 mL/min (ref >60)
GFR calc non Af Amer: >60 mL/min (ref >60)
GFR calc non Af Amer: >60 mL/min (ref >60)
GFR calc non Af Amer: >60 mL/min (ref >60)
Glucose, Bld: 90 mg/dL (ref 70–99)
Glucose, Bld: 96 mg/dL (ref 70–99)
Potassium: 3.3 mEq/L — ABNORMAL LOW (ref 3.5–5.1)
Potassium: 3.7 mEq/L (ref 3.5–5.1)
Potassium: 3.8 mEq/L (ref 3.5–5.1)
Sodium: 137 mEq/L (ref 135–145)
Sodium: 140 mEq/L (ref 135–145)
Sodium: 140 mEq/L (ref 135–145)
Sodium: 141 mEq/L (ref 135–145)
Total Bilirubin: 0.4 mg/dL (ref 0.3–1.2)
Total Bilirubin: 1.6 mg/dL — ABNORMAL HIGH (ref 0.3–1.2)
Total Protein: 6.5 g/dL (ref 6.0–8.3)
Total Protein: 7.7 g/dL (ref 6.0–8.3)
Total Protein: 7.7 g/dL (ref 6.0–8.3)
Total Protein: 7.8 g/dL (ref 6.0–8.3)

## 2010-09-28 LAB — CBC
HCT: 33.3 % — ABNORMAL LOW (ref 36.0–46.0)
HCT: 37.2 % (ref 36.0–46.0)
HCT: 39.4 % (ref 36.0–46.0)
Hemoglobin: 11.5 g/dL — ABNORMAL LOW (ref 12.0–15.0)
Hemoglobin: 13.2 g/dL (ref 12.0–15.0)
MCHC: 33.6 g/dL (ref 30.0–36.0)
MCHC: 34.2 g/dL (ref 30.0–36.0)
MCHC: 34.3 g/dL (ref 30.0–36.0)
MCHC: 34.4 g/dL (ref 30.0–36.0)
MCV: 84.1 fL (ref 78.0–100.0)
MCV: 84.4 fL (ref 78.0–100.0)
Platelets: 229 10*3/uL (ref 150–400)
Platelets: 257 10*3/uL (ref 150–400)
RBC: 3.95 MIL/uL (ref 3.87–5.11)
RBC: 4.23 MIL/uL (ref 3.87–5.11)
RBC: 4.73 MIL/uL (ref 3.87–5.11)
RBC: 4.8 MIL/uL (ref 3.87–5.11)
RDW: 11.9 % (ref 11.5–15.5)
RDW: 12 % (ref 11.5–15.5)
RDW: 12.4 % (ref 11.5–15.5)
WBC: 7 10*3/uL (ref 4.0–10.5)
WBC: 7.9 10*3/uL (ref 4.0–10.5)
WBC: 9 10*3/uL (ref 4.0–10.5)

## 2010-09-28 LAB — DIFFERENTIAL
Basophils Absolute: 0 10*3/uL (ref 0.0–0.1)
Basophils Relative: 1 % (ref 0–1)
Basophils Relative: 3 % — ABNORMAL HIGH (ref 0–1)
Eosinophils Absolute: 0.3 10*3/uL (ref 0.0–0.7)
Lymphocytes Relative: 25 % (ref 12–46)
Lymphs Abs: 2.3 10*3/uL (ref 0.7–4.0)
Lymphs Abs: 3.2 10*3/uL (ref 0.7–4.0)
Monocytes Absolute: 0.6 10*3/uL (ref 0.1–1.0)
Monocytes Absolute: 0.7 10*3/uL (ref 0.1–1.0)
Monocytes Relative: 7 % (ref 3–12)
Monocytes Relative: 7 % (ref 3–12)
Monocytes Relative: 8 % (ref 3–12)
Neutro Abs: 3.5 10*3/uL (ref 1.7–7.7)
Neutro Abs: 6.1 10*3/uL (ref 1.7–7.7)
Neutrophils Relative %: 44 % (ref 43–77)
Neutrophils Relative %: 64 % (ref 43–77)

## 2010-09-28 LAB — URINE MICROSCOPIC-ADD ON

## 2010-09-28 LAB — URINALYSIS, ROUTINE W REFLEX MICROSCOPIC
Glucose, UA: NEGATIVE mg/dL
Ketones, ur: NEGATIVE mg/dL
Leukocytes, UA: NEGATIVE
Nitrite: NEGATIVE
Protein, ur: NEGATIVE mg/dL
Specific Gravity, Urine: >1.03 — ABNORMAL HIGH (ref 1.005–1.030)
pH: 5.5 (ref 5.0–8.0)

## 2010-09-28 LAB — URINALYSIS, MICROSCOPIC ONLY
Glucose, UA: NEGATIVE mg/dL
Ketones, ur: NEGATIVE mg/dL
Leukocytes, UA: NEGATIVE
Nitrite: NEGATIVE
Specific Gravity, Urine: 1.019 (ref 1.005–1.030)
pH: 6 (ref 5.0–8.0)

## 2010-09-28 LAB — AMYLASE: Amylase: 179 U/L — ABNORMAL HIGH (ref 0–105)

## 2010-09-28 LAB — LIPASE, BLOOD: Lipase: 48 U/L (ref 11–59)

## 2010-09-28 LAB — URINE CULTURE

## 2010-09-28 NOTE — Consult Note (Signed)
NAMEGAYANNE, PRESCOTT               ACCOUNT NO.:  [id number]  MEDICAL RECORD NO.:  [id number]           PATIENT TYPE:  I  LOCATION:  5125                         FACILITY:  [facility]  PHYSICIAN:  Alaze Garverick L. Malon Kindle., M.D.DATE OF BIRTH:  Mar 03, 1981  DATE OF CONSULTATION: DATE OF DISCHARGE:                                CONSULTATION   PRIMARY GASTROENTEROLOGIST:  None, has seen several.  HISTORY:  A 30 year old with documented chronic pancreatitis, readmitted with abdominal pain.  She has a complicated course and has had multiple ER visits for pancreatitis.  Her chart is full of ER visits, physician visits going back to 3 years.  It is notable that she has had several diagnostic laparoscopies for chronic pelvic pain with diagnoses of endometriosis going back to 2008, with lysis of adhesions.  She has carried this diagnoses of pelvic endometriosis and pelvic adhesions for some time.  She has carried a diagnosis of chronic pelvic pain syndrome. She is a nondrinker.  She has had almost weekly ER visits for various complaints for the past 4-5 years with abdominal pain and pancreatitis. She has been seen by multiple gastroenterologists.  She is a nondrinker and the cause of her pancreatitis has remained unclear.  She ended up having a laparoscopic cholecystectomy in 2008, with a normal cholangiogram based on chronic bouts of pancreatitis of unclear etiology.  The gallbladder apparently turned out to be normal.  She continued to have chronic pain.  She has continued with multiple ER visits and a diagnosis of acute on chronic pancreatitis.  This has required multiple admissions to the hospital.  She has had previous hysterectomy and salpingo-oophorectomy.  Finally, because of her chronic pain and previous cholecystectomy, she underwent a diagnostic laparoscopy in 2010 by Dr. Carolynne Edouard, revealing some adhesions that were lysed.  This apparently did not really help her pain.  She has continued to  have multiple ER visits and admissions for pancreatitis.  She had an ERCP that was attempted and this showed grossly normal pancreatic duct with common bile duct could not be cannulated.  MRCP was subsequently done which showed no stones in her common bile duct.  About 1-1/2 years ago, the patient underwent EUS with celiac plexus block for pain relief by Dr. Dulce Sellar.  The EUS revealed a normal common bile duct with no stone material.  The pancreatic parenchyma and pancreatic duct were evaluated and there were EUS findings of chronic pancreatitis.  The patient underwent a celiac block by Dr. Dulce Sellar.  It is not clear how much this helped her pain.  Within a month, she was readmitted with recurrent pancreatitis.  She has continued to follow up with the [facility] and an appointment has made with Dr. Achilles Dunk over at [facility] for evaluation for treatment for her chronic pancreatitis.  She was readmitted now with chronic pain and we were asked to see her.  She apparently does have an appointment with Dr. Gwinda Passe in about a month. On admission, her amylase and lipase were elevated and she has slowly improved with medication.  The patient notes that she has been tried on multiple  oral outpatient medicines and has been tried on pancreatic enzymes without improvement.  Labs on admission revealed normal CBC, basically normal liver tests, and elevated lipase and amylase, now are back down to normal.  She is just starting a diet today, feels a little better.  MEDICATIONS ON ADMISSION:  Estradiol and vitamins.  She is allergic to ASPIRIN, DEMEROL, IBUPROFEN, MORPHINE, PROTONIX, TORADOL, TYLENOL, ULTRAM, and can take only Dilaudid.  MEDICAL HISTORY: 1. Chronic pancreatitis and chronic abdominal pain as noted above.     The patient is status post cholecystectomy, EUS with celiac nerve     block, diagnostic laparoscopy, lysis of adhesions, all without     improvement. 2. 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 3. Status post hysterectomy and oophorectomy. 4. History of asthma.  SOCIAL HISTORY:  The patient denies alcohol and lives at home here with her children.  FAMILY HISTORY:  Negative for pancreatitis, is positive for sickle cell.  PHYSICAL EXAMINATION:  VITAL SIGNS:  Unremarkable. GENERAL:  An alert African American female, currently lying in bed, feeding her sister's baby. HEENT:  Eyes, sclerae nonicteric. LUNGS:  Clear. HEART:  Regular rate and rhythm without murmurs, rubs, or gallops. ABDOMEN:  Minimal if any tenderness.  Bowel sounds are normal.  It is soft.  ASSESSMENT:  Continued acute on chronic pancreatitis of unclear etiology.  The patient has documented chronic pancreatitis on EUS.  It is unclear what effects the intraabdominal and pelvic adhesions have on this disease or her chronic pain.  At this point, there is not really much else we can offer her other than pain medications.  PLAN:  I would strongly suggest that she continue to follow up with Dr. Gwinda Passe at [facility] for another opinion regarding her acute on chronic pancreatitis.  This visit scheduled in April and we will send a copy of this note to him as a summary.          ______________________________ Llana Aliment Malon Kindle., M.D.     Waldron Session  D:  09/24/2010  T:  09/25/2010  Job:  [id number]  cc:   Dr. Achilles Dunk  Electronically Signed by Carman Ching M.D. on 09/28/2010 07:12:19 AM

## 2010-10-04 ENCOUNTER — Emergency Department (HOSPITAL_COMMUNITY)
Admission: EM | Admit: 2010-10-04 | Discharge: 2010-10-05 | Disposition: A | Discharge status: Home / Self Care | Payer: Medicaid Other | Attending: Emergency Medicine | Admitting: Emergency Medicine

## 2010-10-04 DIAGNOSIS — R10819 Abdominal tenderness, unspecified site: Secondary | ICD-10-CM | POA: Insufficient documentation

## 2010-10-04 DIAGNOSIS — R109 Unspecified abdominal pain: Secondary | ICD-10-CM | POA: Insufficient documentation

## 2010-10-04 DIAGNOSIS — J45909 Unspecified asthma, uncomplicated: Secondary | ICD-10-CM | POA: Insufficient documentation

## 2010-10-04 DIAGNOSIS — G8929 Other chronic pain: Secondary | ICD-10-CM | POA: Insufficient documentation

## 2010-10-04 DIAGNOSIS — R110 Nausea: Secondary | ICD-10-CM | POA: Insufficient documentation

## 2010-10-04 DIAGNOSIS — R6883 Chills (without fever): Secondary | ICD-10-CM | POA: Insufficient documentation

## 2010-10-04 DIAGNOSIS — K861 Other chronic pancreatitis: Secondary | ICD-10-CM | POA: Insufficient documentation

## 2010-10-05 LAB — COMPREHENSIVE METABOLIC PANEL
BUN: 12 mg/dL (ref 6–23)
Calcium: 8.3 mg/dL — ABNORMAL LOW (ref 8.4–10.5)
GFR calc non Af Amer: >60 mL/min (ref >60)
Glucose, Bld: 104 mg/dL — ABNORMAL HIGH (ref 70–99)
Total Protein: 6.5 g/dL (ref 6.0–8.3)

## 2010-10-05 LAB — DIFFERENTIAL
Eosinophils Absolute: 0.5 10*3/uL (ref 0.0–0.7)
Eosinophils Relative: 4 % (ref 0–5)
Lymphs Abs: 3 10*3/uL (ref 0.7–4.0)
Monocytes Absolute: 1 10*3/uL (ref 0.1–1.0)
Monocytes Relative: 9 % (ref 3–12)

## 2010-10-05 LAB — URINE MICROSCOPIC-ADD ON

## 2010-10-05 LAB — URINALYSIS, ROUTINE W REFLEX MICROSCOPIC
Ketones, ur: NEGATIVE mg/dL
Leukocytes, UA: NEGATIVE
Nitrite: NEGATIVE
Specific Gravity, Urine: 1.032 — ABNORMAL HIGH (ref 1.005–1.030)
Urobilinogen, UA: 1 mg/dL (ref 0.0–1.0)
pH: 6 (ref 5.0–8.0)

## 2010-10-05 LAB — CBC
MCH: 27.5 pg (ref 26.0–34.0)
MCHC: 34 g/dL (ref 30.0–36.0)
MCV: 80.8 fL (ref 78.0–100.0)
Platelets: 267 10*3/uL (ref 150–400)
RDW: 12.3 % (ref 11.5–15.5)

## 2010-10-05 LAB — LIPASE, BLOOD: Lipase: 50 U/L (ref 11–59)

## 2010-10-06 LAB — COMPREHENSIVE METABOLIC PANEL
ALT: 179 U/L — ABNORMAL HIGH (ref 0–35)
ALT: 388 U/L — ABNORMAL HIGH (ref 0–35)
AST: 257 U/L — ABNORMAL HIGH (ref 0–37)
AST: 35 U/L (ref 0–37)
AST: 41 U/L — ABNORMAL HIGH (ref 0–37)
Albumin: 3.9 g/dL (ref 3.5–5.2)
Albumin: 4 g/dL (ref 3.5–5.2)
Alkaline Phosphatase: 122 U/L — ABNORMAL HIGH (ref 39–117)
Alkaline Phosphatase: 134 U/L — ABNORMAL HIGH (ref 39–117)
BUN: 16 mg/dL (ref 6–23)
CO2: 27 mEq/L (ref 19–32)
CO2: 28 mEq/L (ref 19–32)
CO2: 27 mEq/L (ref 19–32)
Calcium: 8.9 mg/dL (ref 8.4–10.5)
Chloride: 102 mEq/L (ref 96–112)
Chloride: 104 mEq/L (ref 96–112)
Chloride: 104 mEq/L (ref 96–112)
Creatinine, Ser: 0.89 mg/dL (ref 0.4–1.2)
Creatinine, Ser: 0.68 mg/dL (ref 0.4–1.2)
GFR calc Af Amer: >60 mL/min (ref >60)
GFR calc Af Amer: >60 mL/min (ref >60)
GFR calc Af Amer: >60 mL/min (ref >60)
GFR calc non Af Amer: >60 mL/min (ref >60)
GFR calc non Af Amer: >60 mL/min (ref >60)
GFR calc non Af Amer: >60 mL/min (ref >60)
Glucose, Bld: 88 mg/dL (ref 70–99)
Glucose, Bld: 98 mg/dL (ref 70–99)
Potassium: 3.8 mEq/L (ref 3.5–5.1)
Potassium: 3.8 mEq/L (ref 3.5–5.1)
Sodium: 137 mEq/L (ref 135–145)
Total Bilirubin: 0.2 mg/dL — ABNORMAL LOW (ref 0.3–1.2)
Total Bilirubin: 0.5 mg/dL (ref 0.3–1.2)
Total Protein: 7.8 g/dL (ref 6.0–8.3)
Total Protein: 8.4 g/dL — ABNORMAL HIGH (ref 6.0–8.3)

## 2010-10-06 LAB — CBC
HCT: 33 % — ABNORMAL LOW (ref 36.0–46.0)
HCT: 36.4 % (ref 36.0–46.0)
Hemoglobin: 11 g/dL — ABNORMAL LOW (ref 12.0–15.0)
Hemoglobin: 12.3 g/dL (ref 12.0–15.0)
Hemoglobin: 12.9 g/dL (ref 12.0–15.0)
MCHC: 35.4 g/dL (ref 30.0–36.0)
MCHC: 34.3 g/dL (ref 30.0–36.0)
MCV: 82.7 fL (ref 78.0–100.0)
MCV: 82.8 fL (ref 78.0–100.0)
Platelets: 273 10*3/uL (ref 150–400)
Platelets: 317 10*3/uL (ref 150–400)
Platelets: 332 10*3/uL (ref 150–400)
RBC: 4.4 MIL/uL (ref 3.87–5.11)
RBC: 4.59 MIL/uL (ref 3.87–5.11)
RBC: 4.81 MIL/uL (ref 3.87–5.11)
RDW: 12.8 % (ref 11.5–15.5)
RDW: 13 % (ref 11.5–15.5)
RDW: 12.2 % (ref 11.5–15.5)
WBC: 10.1 10*3/uL (ref 4.0–10.5)
WBC: 5.8 10*3/uL (ref 4.0–10.5)
WBC: 8.1 10*3/uL (ref 4.0–10.5)
WBC: 8.8 10*3/uL (ref 4.0–10.5)

## 2010-10-06 LAB — DIFFERENTIAL
Basophils Absolute: 0 10*3/uL (ref 0.0–0.1)
Basophils Absolute: 0.1 10*3/uL (ref 0.0–0.1)
Basophils Relative: 0 % (ref 0–1)
Basophils Relative: 1 % (ref 0–1)
Eosinophils Absolute: 0.3 10*3/uL (ref 0.0–0.7)
Eosinophils Absolute: 0.3 10*3/uL (ref 0.0–0.7)
Eosinophils Relative: 1 % (ref 0–5)
Eosinophils Relative: 3 % (ref 0–5)
Eosinophils Relative: 4 % (ref 0–5)
Eosinophils Relative: 1 % (ref 0–5)
Lymphocytes Relative: 27 % (ref 12–46)
Lymphocytes Relative: 26 % (ref 12–46)
Lymphs Abs: 2.4 10*3/uL (ref 0.7–4.0)
Monocytes Absolute: 0.6 10*3/uL (ref 0.1–1.0)
Monocytes Absolute: 0.7 10*3/uL (ref 0.1–1.0)
Monocytes Relative: 9 % (ref 3–12)
Neutrophils Relative %: 64 % (ref 43–77)
Neutrophils Relative %: 65 % (ref 43–77)

## 2010-10-06 LAB — BASIC METABOLIC PANEL
Calcium: 8.6 mg/dL (ref 8.4–10.5)
GFR calc non Af Amer: >60 mL/min (ref >60)
Glucose, Bld: 112 mg/dL — ABNORMAL HIGH (ref 70–99)
Potassium: 4.1 mEq/L (ref 3.5–5.1)
Sodium: 139 mEq/L (ref 135–145)

## 2010-10-06 LAB — LIPASE, BLOOD
Lipase: 258 U/L — ABNORMAL HIGH (ref 11–59)
Lipase: 58 U/L (ref 11–59)

## 2010-10-06 LAB — URINALYSIS, ROUTINE W REFLEX MICROSCOPIC
Bilirubin Urine: NEGATIVE
Bilirubin Urine: NEGATIVE
Glucose, UA: NEGATIVE mg/dL
Glucose, UA: NEGATIVE mg/dL
Glucose, UA: NEGATIVE mg/dL
Hgb urine dipstick: NEGATIVE
Hgb urine dipstick: NEGATIVE
Hgb urine dipstick: NEGATIVE
Ketones, ur: NEGATIVE mg/dL
Protein, ur: NEGATIVE mg/dL
Protein, ur: NEGATIVE mg/dL
Specific Gravity, Urine: 1.021 (ref 1.005–1.030)
Specific Gravity, Urine: 1.025 (ref 1.005–1.030)
Urobilinogen, UA: 0.2 mg/dL (ref 0.0–1.0)
Urobilinogen, UA: 1 mg/dL (ref 0.0–1.0)

## 2010-10-06 LAB — POCT PREGNANCY, URINE: Preg Test, Ur: NEGATIVE

## 2010-10-07 LAB — DIFFERENTIAL
Basophils Absolute: 0 10*3/uL (ref 0.0–0.1)
Basophils Relative: 0 % (ref 0–1)
Eosinophils Absolute: 0.1 10*3/uL (ref 0.0–0.7)
Eosinophils Relative: 1 % (ref 0–5)
Monocytes Relative: 7 % (ref 3–12)
Neutro Abs: 8.2 10*3/uL — ABNORMAL HIGH (ref 1.7–7.7)
Neutrophils Relative %: 84 % — ABNORMAL HIGH (ref 43–77)

## 2010-10-07 LAB — URINE MICROSCOPIC-ADD ON

## 2010-10-07 LAB — URINALYSIS, ROUTINE W REFLEX MICROSCOPIC
Glucose, UA: NEGATIVE mg/dL
Ketones, ur: 15 mg/dL — AB
Protein, ur: 30 mg/dL — AB
pH: 5.5 (ref 5.0–8.0)

## 2010-10-07 LAB — COMPREHENSIVE METABOLIC PANEL
Alkaline Phosphatase: 95 U/L (ref 39–117)
BUN: 18 mg/dL (ref 6–23)
Chloride: 106 mEq/L (ref 96–112)
Glucose, Bld: 109 mg/dL — ABNORMAL HIGH (ref 70–99)
Potassium: 4.4 mEq/L (ref 3.5–5.1)
Sodium: 140 mEq/L (ref 135–145)
Total Bilirubin: 1 mg/dL (ref 0.3–1.2)

## 2010-10-07 LAB — CBC
HCT: 41.3 % (ref 36.0–46.0)
Hemoglobin: 14.1 g/dL (ref 12.0–15.0)
WBC: 9.7 10*3/uL (ref 4.0–10.5)

## 2010-10-07 LAB — TYPE AND SCREEN
ABO/RH(D): O POS
Antibody Screen: NEGATIVE

## 2010-10-07 LAB — PROTIME-INR: Prothrombin Time: 13.8 seconds (ref 11.6–15.2)

## 2010-10-08 ENCOUNTER — Emergency Department (HOSPITAL_COMMUNITY)
Admission: EM | Admit: 2010-10-08 | Discharge: 2010-10-09 | Disposition: A | Discharge status: Home / Self Care | Payer: Medicaid Other | Attending: Emergency Medicine | Admitting: Emergency Medicine

## 2010-10-08 DIAGNOSIS — J45909 Unspecified asthma, uncomplicated: Secondary | ICD-10-CM | POA: Insufficient documentation

## 2010-10-08 DIAGNOSIS — R1110 Vomiting, unspecified: Secondary | ICD-10-CM | POA: Insufficient documentation

## 2010-10-08 DIAGNOSIS — K861 Other chronic pancreatitis: Secondary | ICD-10-CM | POA: Insufficient documentation

## 2010-10-08 DIAGNOSIS — G8929 Other chronic pain: Secondary | ICD-10-CM | POA: Insufficient documentation

## 2010-10-08 DIAGNOSIS — R197 Diarrhea, unspecified: Secondary | ICD-10-CM | POA: Insufficient documentation

## 2010-10-08 DIAGNOSIS — R109 Unspecified abdominal pain: Secondary | ICD-10-CM | POA: Insufficient documentation

## 2010-10-08 LAB — URINALYSIS, ROUTINE W REFLEX MICROSCOPIC
Glucose, UA: NEGATIVE mg/dL
Hgb urine dipstick: NEGATIVE
Protein, ur: NEGATIVE mg/dL
pH: 6.5 (ref 5.0–8.0)

## 2010-10-08 LAB — COMPREHENSIVE METABOLIC PANEL
ALT: 11 U/L (ref 0–35)
ALT: 45 U/L — ABNORMAL HIGH (ref 0–35)
AST: 33 U/L (ref 0–37)
Albumin: 3.7 g/dL (ref 3.5–5.2)
Alkaline Phosphatase: 82 U/L (ref 39–117)
BUN: 9 mg/dL (ref 6–23)
CO2: 24 mEq/L (ref 19–32)
CO2: 27 mEq/L (ref 19–32)
Calcium: 9.4 mg/dL (ref 8.4–10.5)
Chloride: 104 mEq/L (ref 96–112)
Chloride: 108 mEq/L (ref 96–112)
Creatinine, Ser: 0.71 mg/dL (ref 0.4–1.2)
GFR calc Af Amer: >60 mL/min (ref >60)
GFR calc non Af Amer: >60 mL/min (ref >60)
GFR calc non Af Amer: >60 mL/min (ref >60)
Glucose, Bld: 91 mg/dL (ref 70–99)
Potassium: 3.6 mEq/L (ref 3.5–5.1)
Sodium: 139 mEq/L (ref 135–145)
Sodium: 141 mEq/L (ref 135–145)
Total Bilirubin: 0.4 mg/dL (ref 0.3–1.2)
Total Bilirubin: 0.4 mg/dL (ref 0.3–1.2)
Total Protein: 7.7 g/dL (ref 6.0–8.3)

## 2010-10-08 LAB — CBC
HCT: 39.2 % (ref 36.0–46.0)
Hemoglobin: 13.5 g/dL (ref 12.0–15.0)
MCV: 82.4 fL (ref 78.0–100.0)
Platelets: 316 10*3/uL (ref 150–400)
RBC: 4.72 MIL/uL (ref 3.87–5.11)
RBC: 4.77 MIL/uL (ref 3.87–5.11)
RDW: 12.8 % (ref 11.5–15.5)
WBC: 6.5 10*3/uL (ref 4.0–10.5)

## 2010-10-08 LAB — LIPASE, BLOOD: Lipase: 57 U/L (ref 11–59)

## 2010-10-08 LAB — DIFFERENTIAL
Basophils Absolute: 0.1 10*3/uL (ref 0.0–0.1)
Basophils Relative: 1 % (ref 0–1)
Eosinophils Absolute: 0.3 10*3/uL (ref 0.0–0.7)
Neutro Abs: 7.4 10*3/uL (ref 1.7–7.7)
Neutrophils Relative %: 68 % (ref 43–77)

## 2010-10-08 LAB — POCT PREGNANCY, URINE: Preg Test, Ur: NEGATIVE

## 2010-10-09 LAB — URINALYSIS, ROUTINE W REFLEX MICROSCOPIC
Bilirubin Urine: NEGATIVE
Bilirubin Urine: NEGATIVE
Bilirubin Urine: NEGATIVE
Glucose, UA: NEGATIVE mg/dL
Glucose, UA: NEGATIVE mg/dL
Glucose, UA: NEGATIVE mg/dL
Glucose, UA: NEGATIVE mg/dL
Glucose, UA: NEGATIVE mg/dL
Hgb urine dipstick: NEGATIVE
Hgb urine dipstick: NEGATIVE
Ketones, ur: NEGATIVE mg/dL
Ketones, ur: NEGATIVE mg/dL
Nitrite: NEGATIVE
Protein, ur: NEGATIVE mg/dL
Protein, ur: NEGATIVE mg/dL
Specific Gravity, Urine: 1.026 (ref 1.005–1.030)
Specific Gravity, Urine: 1.02 (ref 1.005–1.030)
pH: 6 (ref 5.0–8.0)
pH: 8 (ref 5.0–8.0)
pH: 6.5 (ref 5.0–8.0)
pH: 6 (ref 5.0–8.0)
pH: 7.5 (ref 5.0–8.0)

## 2010-10-09 LAB — COMPREHENSIVE METABOLIC PANEL
ALT: 15 U/L (ref 0–35)
ALT: 135 U/L — ABNORMAL HIGH (ref 0–35)
ALT: 243 U/L — ABNORMAL HIGH (ref 0–35)
ALT: 58 U/L — ABNORMAL HIGH (ref 0–35)
AST: 19 U/L (ref 0–37)
AST: 147 U/L — ABNORMAL HIGH (ref 0–37)
AST: 25 U/L (ref 0–37)
AST: 35 U/L (ref 0–37)
AST: 68 U/L — ABNORMAL HIGH (ref 0–37)
Albumin: 3.7 g/dL (ref 3.5–5.2)
Albumin: 3.3 g/dL — ABNORMAL LOW (ref 3.5–5.2)
Albumin: 3.7 g/dL (ref 3.5–5.2)
Albumin: 4.1 g/dL (ref 3.5–5.2)
Albumin: 4 g/dL (ref 3.5–5.2)
Alkaline Phosphatase: 111 U/L (ref 39–117)
Alkaline Phosphatase: 112 U/L (ref 39–117)
Alkaline Phosphatase: 99 U/L (ref 39–117)
BUN: 3 mg/dL — ABNORMAL LOW (ref 6–23)
BUN: 8 mg/dL (ref 6–23)
BUN: 14 mg/dL (ref 6–23)
CO2: 27 mEq/L (ref 19–32)
CO2: 28 mEq/L (ref 19–32)
Calcium: 8.5 mg/dL (ref 8.4–10.5)
Calcium: 9.1 mg/dL (ref 8.4–10.5)
Calcium: 9.2 mg/dL (ref 8.4–10.5)
Calcium: 9.2 mg/dL (ref 8.4–10.5)
Calcium: 9.6 mg/dL (ref 8.4–10.5)
Chloride: 102 mEq/L (ref 96–112)
Chloride: 106 mEq/L (ref 96–112)
Creatinine, Ser: 0.64 mg/dL (ref 0.4–1.2)
Creatinine, Ser: 0.69 mg/dL (ref 0.4–1.2)
Creatinine, Ser: 0.75 mg/dL (ref 0.4–1.2)
Creatinine, Ser: 0.72 mg/dL (ref 0.4–1.2)
GFR calc Af Amer: >60 mL/min (ref >60)
GFR calc Af Amer: >60 mL/min (ref >60)
GFR calc Af Amer: >60 mL/min (ref >60)
GFR calc Af Amer: >60 mL/min (ref >60)
GFR calc Af Amer: >60 mL/min (ref >60)
GFR calc non Af Amer: >60 mL/min (ref >60)
GFR calc non Af Amer: >60 mL/min (ref >60)
Glucose, Bld: 87 mg/dL (ref 70–99)
Glucose, Bld: 67 mg/dL — ABNORMAL LOW (ref 70–99)
Glucose, Bld: 85 mg/dL (ref 70–99)
Glucose, Bld: 89 mg/dL (ref 70–99)
Glucose, Bld: 91 mg/dL (ref 70–99)
Potassium: 3.9 mEq/L (ref 3.5–5.1)
Sodium: 137 mEq/L (ref 135–145)
Sodium: 140 mEq/L (ref 135–145)
Sodium: 140 mEq/L (ref 135–145)
Total Bilirubin: 0.7 mg/dL (ref 0.3–1.2)
Total Bilirubin: 0.3 mg/dL (ref 0.3–1.2)
Total Protein: 6.9 g/dL (ref 6.0–8.3)
Total Protein: 6 g/dL (ref 6.0–8.3)
Total Protein: 7 g/dL (ref 6.0–8.3)
Total Protein: 7 g/dL (ref 6.0–8.3)
Total Protein: 7.4 g/dL (ref 6.0–8.3)
Total Protein: 7.5 g/dL (ref 6.0–8.3)
Total Protein: 7.4 g/dL (ref 6.0–8.3)

## 2010-10-09 LAB — CBC
HCT: 32.3 % — ABNORMAL LOW (ref 36.0–46.0)
HCT: 37.3 % (ref 36.0–46.0)
HCT: 38.1 % (ref 36.0–46.0)
HCT: 38.7 % (ref 36.0–46.0)
Hemoglobin: 12.3 g/dL (ref 12.0–15.0)
Hemoglobin: 10.9 g/dL — ABNORMAL LOW (ref 12.0–15.0)
Hemoglobin: 12.6 g/dL (ref 12.0–15.0)
Hemoglobin: 12.8 g/dL (ref 12.0–15.0)
Hemoglobin: 13.2 g/dL (ref 12.0–15.0)
MCH: 26.7 pg (ref 26.0–34.0)
MCHC: 33.5 g/dL (ref 30.0–36.0)
MCHC: 33.9 g/dL (ref 30.0–36.0)
MCHC: 34.8 g/dL (ref 30.0–36.0)
MCV: 79.8 fL (ref 78.0–100.0)
MCV: 82.5 fL (ref 78.0–100.0)
MCV: 82.5 fL (ref 78.0–100.0)
MCV: 82.9 fL (ref 78.0–100.0)
MCV: 82.7 fL (ref 78.0–100.0)
Platelets: 220 10*3/uL (ref 150–400)
Platelets: 300 10*3/uL (ref 150–400)
Platelets: 278 10*3/uL (ref 150–400)
RBC: 4.6 MIL/uL (ref 3.87–5.11)
RBC: 4.24 MIL/uL (ref 3.87–5.11)
RBC: 4.49 MIL/uL (ref 3.87–5.11)
RDW: 12.3 % (ref 11.5–15.5)
RDW: 12.5 % (ref 11.5–15.5)
RDW: 11.7 % (ref 11.5–15.5)
WBC: 7.4 10*3/uL (ref 4.0–10.5)
WBC: 8.1 10*3/uL (ref 4.0–10.5)

## 2010-10-09 LAB — POCT I-STAT, CHEM 8
BUN: 14 mg/dL (ref 6–23)
Chloride: 108 mEq/L (ref 96–112)
Chloride: 108 mEq/L (ref 96–112)
Creatinine, Ser: 0.6 mg/dL (ref 0.4–1.2)
Creatinine, Ser: 0.8 mg/dL (ref 0.4–1.2)
Glucose, Bld: 82 mg/dL (ref 70–99)
Potassium: 3.6 mEq/L (ref 3.5–5.1)
Sodium: 141 mEq/L (ref 135–145)
TCO2: 25 mmol/L (ref 0–100)

## 2010-10-09 LAB — HEPATIC FUNCTION PANEL
ALT: 47 U/L — ABNORMAL HIGH (ref 0–35)
ALT: 59 U/L — ABNORMAL HIGH (ref 0–35)
ALT: 53 U/L — ABNORMAL HIGH (ref 0–35)
ALT: 58 U/L — ABNORMAL HIGH (ref 0–35)
AST: 19 U/L (ref 0–37)
AST: 27 U/L (ref 0–37)
AST: 29 U/L (ref 0–37)
Albumin: 3.3 g/dL — ABNORMAL LOW (ref 3.5–5.2)
Alkaline Phosphatase: 100 U/L (ref 39–117)
Alkaline Phosphatase: 94 U/L (ref 39–117)
Alkaline Phosphatase: 100 U/L (ref 39–117)
Bilirubin, Direct: 0.1 mg/dL (ref 0.0–0.3)
Bilirubin, Direct: <0.1 mg/dL (ref 0.0–0.3)
Indirect Bilirubin: 0.6 mg/dL (ref 0.3–0.9)
Total Bilirubin: 0.5 mg/dL (ref 0.3–1.2)
Total Bilirubin: 0.7 mg/dL (ref 0.3–1.2)
Total Protein: 7.6 g/dL (ref 6.0–8.3)

## 2010-10-09 LAB — AMYLASE
Amylase: 109 U/L (ref 27–131)
Amylase: 84 U/L (ref 27–131)

## 2010-10-09 LAB — DIFFERENTIAL
Basophils Absolute: 0.1 10*3/uL (ref 0.0–0.1)
Basophils Relative: 1 % (ref 0–1)
Basophils Relative: 1 % (ref 0–1)
Eosinophils Absolute: 0.4 10*3/uL (ref 0.0–0.7)
Eosinophils Relative: 6 % — ABNORMAL HIGH (ref 0–5)
Eosinophils Relative: 6 % — ABNORMAL HIGH (ref 0–5)
Lymphocytes Relative: 39 % (ref 12–46)
Lymphocytes Relative: 36 % (ref 12–46)
Lymphocytes Relative: 31 % (ref 12–46)
Lymphs Abs: 4.1 10*3/uL — ABNORMAL HIGH (ref 0.7–4.0)
Lymphs Abs: 2.4 10*3/uL (ref 0.7–4.0)
Lymphs Abs: 2.5 10*3/uL (ref 0.7–4.0)
Monocytes Absolute: 0.6 10*3/uL (ref 0.1–1.0)
Monocytes Absolute: 0.5 10*3/uL (ref 0.1–1.0)
Monocytes Absolute: 0.7 10*3/uL (ref 0.1–1.0)
Monocytes Relative: 8 % (ref 3–12)
Monocytes Relative: 8 % (ref 3–12)
Neutro Abs: 5 10*3/uL (ref 1.7–7.7)
Neutro Abs: 4.3 10*3/uL (ref 1.7–7.7)
Neutro Abs: 4.4 10*3/uL (ref 1.7–7.7)
Neutrophils Relative %: 51 % (ref 43–77)

## 2010-10-09 LAB — LIPASE, BLOOD
Lipase: 55 U/L (ref 11–59)
Lipase: 199 U/L — ABNORMAL HIGH (ref 11–59)

## 2010-10-09 LAB — PROTIME-INR: Prothrombin Time: 13.7 seconds (ref 11.6–15.2)

## 2010-10-09 LAB — URINE MICROSCOPIC-ADD ON

## 2010-10-11 LAB — CBC
HCT: 37.7 % (ref 36.0–46.0)
HCT: 36.1 % (ref 36.0–46.0)
HCT: 39.7 % (ref 36.0–46.0)
HCT: 39.1 % (ref 36.0–46.0)
Hemoglobin: 13.1 g/dL (ref 12.0–15.0)
Hemoglobin: 12.1 g/dL (ref 12.0–15.0)
Hemoglobin: 13.2 g/dL (ref 12.0–15.0)
Hemoglobin: 13.2 g/dL (ref 12.0–15.0)
MCHC: 33.6 g/dL (ref 30.0–36.0)
MCHC: 33.7 g/dL (ref 30.0–36.0)
MCV: 82.5 fL (ref 78.0–100.0)
MCV: 82.5 fL (ref 78.0–100.0)
MCV: 82.3 fL (ref 78.0–100.0)
Platelets: 346 10*3/uL (ref 150–400)
Platelets: 270 10*3/uL (ref 150–400)
RBC: 4.38 MIL/uL (ref 3.87–5.11)
RBC: 4.81 MIL/uL (ref 3.87–5.11)
RBC: 4.75 MIL/uL (ref 3.87–5.11)
RDW: 12.7 % (ref 11.5–15.5)
RDW: 12.7 % (ref 11.5–15.5)
WBC: 9.6 10*3/uL (ref 4.0–10.5)
WBC: 7.1 10*3/uL (ref 4.0–10.5)
WBC: 7.9 10*3/uL (ref 4.0–10.5)
WBC: 9.7 K/uL (ref 4.0–10.5)

## 2010-10-11 LAB — DIFFERENTIAL
Basophils Absolute: 0.1 10*3/uL (ref 0.0–0.1)
Basophils Absolute: 0.1 K/uL (ref 0.0–0.1)
Basophils Relative: 1 % (ref 0–1)
Basophils Relative: 1 % (ref 0–1)
Basophils Relative: 2 % — ABNORMAL HIGH (ref 0–1)
Eosinophils Absolute: 0.7 10*3/uL (ref 0.0–0.7)
Eosinophils Absolute: 0.3 10*3/uL (ref 0.0–0.7)
Eosinophils Absolute: 0.5 K/uL (ref 0.0–0.7)
Eosinophils Relative: 7 % — ABNORMAL HIGH (ref 0–5)
Eosinophils Relative: 5 % (ref 0–5)
Eosinophils Relative: 5 % (ref 0–5)
Lymphocytes Relative: 24 % (ref 12–46)
Lymphs Abs: 1.7 10*3/uL (ref 0.7–4.0)
Lymphs Abs: 2.3 10*3/uL (ref 0.7–4.0)
Monocytes Absolute: 0.8 10*3/uL (ref 0.1–1.0)
Monocytes Absolute: 0.5 10*3/uL (ref 0.1–1.0)
Monocytes Absolute: 0.6 10*3/uL (ref 0.1–1.0)
Monocytes Absolute: 0.6 K/uL (ref 0.1–1.0)
Monocytes Relative: 8 % (ref 3–12)
Monocytes Relative: 7 % (ref 3–12)
Monocytes Relative: 8 % (ref 3–12)
Monocytes Relative: 6 % (ref 3–12)
Neutro Abs: 4.8 10*3/uL (ref 1.7–7.7)
Neutro Abs: 6.2 10*3/uL (ref 1.7–7.7)
Neutrophils Relative %: 64 % (ref 43–77)

## 2010-10-11 LAB — COMPREHENSIVE METABOLIC PANEL WITH GFR
ALT: 19 U/L (ref 0–35)
Alkaline Phosphatase: 88 U/L (ref 39–117)
CO2: 26 meq/L (ref 19–32)
Chloride: 109 meq/L (ref 96–112)
Glucose, Bld: 84 mg/dL (ref 70–99)
Potassium: 3.7 meq/L (ref 3.5–5.1)
Sodium: 142 meq/L (ref 135–145)
Total Bilirubin: 0.4 mg/dL (ref 0.3–1.2)
Total Protein: 7.3 g/dL (ref 6.0–8.3)

## 2010-10-11 LAB — POCT PREGNANCY, URINE: Preg Test, Ur: NEGATIVE

## 2010-10-11 LAB — URINALYSIS, ROUTINE W REFLEX MICROSCOPIC
Bilirubin Urine: NEGATIVE
Glucose, UA: NEGATIVE mg/dL
Glucose, UA: NEGATIVE mg/dL
Glucose, UA: NEGATIVE mg/dL
Glucose, UA: NEGATIVE mg/dL
Hgb urine dipstick: NEGATIVE
Hgb urine dipstick: NEGATIVE
Ketones, ur: NEGATIVE mg/dL
Ketones, ur: NEGATIVE mg/dL
Leukocytes, UA: NEGATIVE
Leukocytes, UA: NEGATIVE
Leukocytes, UA: NEGATIVE
Nitrite: NEGATIVE
Nitrite: NEGATIVE
Nitrite: NEGATIVE
Protein, ur: NEGATIVE mg/dL
Protein, ur: NEGATIVE mg/dL
Protein, ur: NEGATIVE mg/dL
Specific Gravity, Urine: 1.023 (ref 1.005–1.030)
Specific Gravity, Urine: 1.03 (ref 1.005–1.030)
Specific Gravity, Urine: 1.03 (ref 1.005–1.030)
Specific Gravity, Urine: 1.026 (ref 1.005–1.030)
Urobilinogen, UA: 0.2 mg/dL (ref 0.0–1.0)
Urobilinogen, UA: 1 mg/dL (ref 0.0–1.0)
pH: 6 (ref 5.0–8.0)
pH: 5.5 (ref 5.0–8.0)
pH: 6 (ref 5.0–8.0)
pH: 6 (ref 5.0–8.0)

## 2010-10-11 LAB — URINE MICROSCOPIC-ADD ON

## 2010-10-11 LAB — COMPREHENSIVE METABOLIC PANEL
ALT: 14 U/L (ref 0–35)
AST: 19 U/L (ref 0–37)
Albumin: 4 g/dL (ref 3.5–5.2)
Albumin: 3.9 g/dL (ref 3.5–5.2)
Alkaline Phosphatase: 86 U/L (ref 39–117)
Alkaline Phosphatase: 82 U/L (ref 39–117)
BUN: 10 mg/dL (ref 6–23)
BUN: 11 mg/dL (ref 6–23)
CO2: 24 mEq/L (ref 19–32)
Calcium: 9 mg/dL (ref 8.4–10.5)
Calcium: 9.3 mg/dL (ref 8.4–10.5)
Chloride: 109 mEq/L (ref 96–112)
Chloride: 107 mEq/L (ref 96–112)
Creatinine, Ser: 0.65 mg/dL (ref 0.4–1.2)
Creatinine, Ser: 0.76 mg/dL (ref 0.4–1.2)
GFR calc Af Amer: >60 mL/min (ref >60)
GFR calc non Af Amer: >60 mL/min (ref >60)
Glucose, Bld: 110 mg/dL — ABNORMAL HIGH (ref 70–99)
Potassium: 3.5 mEq/L (ref 3.5–5.1)
Sodium: 142 mEq/L (ref 135–145)
Total Bilirubin: 0.1 mg/dL — ABNORMAL LOW (ref 0.3–1.2)
Total Protein: 7.9 g/dL (ref 6.0–8.3)

## 2010-10-11 LAB — RAPID URINE DRUG SCREEN, HOSP PERFORMED
Amphetamines: NOT DETECTED
Tetrahydrocannabinol: NOT DETECTED

## 2010-10-11 LAB — WET PREP, GENITAL
Trich, Wet Prep: NONE SEEN
Yeast Wet Prep HPF POC: NONE SEEN

## 2010-10-11 LAB — GC/CHLAMYDIA PROBE AMP, GENITAL
Chlamydia, DNA Probe: NEGATIVE
GC Probe Amp, Genital: NEGATIVE

## 2010-10-11 LAB — LIPASE, BLOOD: Lipase: 28 U/L (ref 11–59)

## 2010-10-11 LAB — RPR: RPR Ser Ql: NONREACTIVE

## 2010-10-12 LAB — URINALYSIS, ROUTINE W REFLEX MICROSCOPIC
Bilirubin Urine: NEGATIVE
Bilirubin Urine: NEGATIVE
Bilirubin Urine: NEGATIVE
Bilirubin Urine: NEGATIVE
Bilirubin Urine: NEGATIVE
Glucose, UA: NEGATIVE mg/dL
Glucose, UA: NEGATIVE mg/dL
Glucose, UA: NEGATIVE mg/dL
Glucose, UA: NEGATIVE mg/dL
Glucose, UA: NEGATIVE mg/dL
Hgb urine dipstick: NEGATIVE
Hgb urine dipstick: NEGATIVE
Hgb urine dipstick: NEGATIVE
Hgb urine dipstick: NEGATIVE
Ketones, ur: NEGATIVE mg/dL
Ketones, ur: NEGATIVE mg/dL
Ketones, ur: NEGATIVE mg/dL
Nitrite: NEGATIVE
Nitrite: NEGATIVE
Nitrite: NEGATIVE
Protein, ur: NEGATIVE mg/dL
Protein, ur: NEGATIVE mg/dL
Protein, ur: NEGATIVE mg/dL
Protein, ur: NEGATIVE mg/dL
Protein, ur: NEGATIVE mg/dL
Protein, ur: NEGATIVE mg/dL
Protein, ur: NEGATIVE mg/dL
Specific Gravity, Urine: 1.023 (ref 1.005–1.030)
Specific Gravity, Urine: 1.027 (ref 1.005–1.030)
Specific Gravity, Urine: 1.027 (ref 1.005–1.030)
Specific Gravity, Urine: 1.028 (ref 1.005–1.030)
Specific Gravity, Urine: 1.031 — ABNORMAL HIGH (ref 1.005–1.030)
Urobilinogen, UA: 0.2 mg/dL (ref 0.0–1.0)
Urobilinogen, UA: 0.2 mg/dL (ref 0.0–1.0)
Urobilinogen, UA: 0.2 mg/dL (ref 0.0–1.0)
Urobilinogen, UA: 0.2 mg/dL (ref 0.0–1.0)
Urobilinogen, UA: 0.2 mg/dL (ref 0.0–1.0)
Urobilinogen, UA: 1 mg/dL (ref 0.0–1.0)
Urobilinogen, UA: 1 mg/dL (ref 0.0–1.0)
pH: 6 (ref 5.0–8.0)
pH: 6 (ref 5.0–8.0)
pH: 6.5 (ref 5.0–8.0)
pH: 7 (ref 5.0–8.0)

## 2010-10-12 LAB — POCT I-STAT, CHEM 8
Calcium, Ion: 1 mmol/L — ABNORMAL LOW (ref 1.12–1.32)
Glucose, Bld: 84 mg/dL (ref 70–99)
HCT: 38 % (ref 36.0–46.0)
Hemoglobin: 12.9 g/dL (ref 12.0–15.0)

## 2010-10-12 LAB — CBC
HCT: 35.9 % — ABNORMAL LOW (ref 36.0–46.0)
HCT: 37.6 % (ref 36.0–46.0)
HCT: 41.5 % (ref 36.0–46.0)
Hemoglobin: 12.2 g/dL (ref 12.0–15.0)
Hemoglobin: 13.2 g/dL (ref 12.0–15.0)
Hemoglobin: 14.1 g/dL (ref 12.0–15.0)
MCHC: 34.1 g/dL (ref 30.0–36.0)
MCHC: 34 g/dL (ref 30.0–36.0)
MCHC: 34.6 g/dL (ref 30.0–36.0)
MCV: 81.6 fL (ref 78.0–100.0)
MCV: 81.7 fL (ref 78.0–100.0)
MCV: 81.8 fL (ref 78.0–100.0)
Platelets: 249 10*3/uL (ref 150–400)
Platelets: 302 K/uL (ref 150–400)
Platelets: 305 10*3/uL (ref 150–400)
Platelets: 307 10*3/uL (ref 150–400)
Platelets: 316 10*3/uL (ref 150–400)
RBC: 4.4 MIL/uL (ref 3.87–5.11)
RBC: 4.55 MIL/uL (ref 3.87–5.11)
RBC: 4.6 MIL/uL (ref 3.87–5.11)
RBC: 4.84 MIL/uL (ref 3.87–5.11)
RDW: 11.8 % (ref 11.5–15.5)
RDW: 12.5 % (ref 11.5–15.5)
RDW: 12.6 % (ref 11.5–15.5)
RDW: 12.7 % (ref 11.5–15.5)
WBC: 7.1 10*3/uL (ref 4.0–10.5)
WBC: 8 10*3/uL (ref 4.0–10.5)
WBC: 8 K/uL (ref 4.0–10.5)
WBC: 8.6 10*3/uL (ref 4.0–10.5)

## 2010-10-12 LAB — URINE MICROSCOPIC-ADD ON

## 2010-10-12 LAB — POCT PREGNANCY, URINE
Preg Test, Ur: NEGATIVE
Preg Test, Ur: NEGATIVE
Preg Test, Ur: NEGATIVE

## 2010-10-12 LAB — COMPREHENSIVE METABOLIC PANEL
ALT: 22 U/L (ref 0–35)
ALT: 92 U/L — ABNORMAL HIGH (ref 0–35)
AST: 19 U/L (ref 0–37)
AST: 19 U/L (ref 0–37)
AST: 30 U/L (ref 0–37)
AST: 47 U/L — ABNORMAL HIGH (ref 0–37)
Albumin: 3.9 g/dL (ref 3.5–5.2)
Albumin: 4 g/dL (ref 3.5–5.2)
Alkaline Phosphatase: 87 U/L (ref 39–117)
Alkaline Phosphatase: 87 U/L (ref 39–117)
BUN: 11 mg/dL (ref 6–23)
BUN: 9 mg/dL (ref 6–23)
CO2: 23 mEq/L (ref 19–32)
CO2: 24 mEq/L (ref 19–32)
CO2: 29 mEq/L (ref 19–32)
Calcium: 9.2 mg/dL (ref 8.4–10.5)
Calcium: 9.8 mg/dL (ref 8.4–10.5)
Calcium: 8.8 mg/dL (ref 8.4–10.5)
Chloride: 106 mEq/L (ref 96–112)
Chloride: 109 mEq/L (ref 96–112)
Creatinine, Ser: 0.86 mg/dL (ref 0.4–1.2)
Creatinine, Ser: 0.7 mg/dL (ref 0.4–1.2)
GFR calc Af Amer: >60 mL/min (ref >60)
GFR calc Af Amer: >60 mL/min (ref >60)
GFR calc Af Amer: >60 mL/min (ref >60)
GFR calc Af Amer: >60 mL/min (ref >60)
GFR calc non Af Amer: >60 mL/min (ref >60)
GFR calc non Af Amer: >60 mL/min (ref >60)
GFR calc non Af Amer: >60 mL/min (ref >60)
Glucose, Bld: 93 mg/dL (ref 70–99)
Glucose, Bld: 82 mg/dL (ref 70–99)
Glucose, Bld: 87 mg/dL (ref 70–99)
Potassium: 3.5 mEq/L (ref 3.5–5.1)
Potassium: 3.6 mEq/L (ref 3.5–5.1)
Sodium: 138 mEq/L (ref 135–145)
Sodium: 139 mEq/L (ref 135–145)
Sodium: 143 mEq/L (ref 135–145)
Total Bilirubin: 0.4 mg/dL (ref 0.3–1.2)
Total Bilirubin: 0.5 mg/dL (ref 0.3–1.2)
Total Protein: 7.1 g/dL (ref 6.0–8.3)
Total Protein: 7.5 g/dL (ref 6.0–8.3)
Total Protein: 8.7 g/dL — ABNORMAL HIGH (ref 6.0–8.3)

## 2010-10-12 LAB — DIFFERENTIAL
Basophils Absolute: 0 10*3/uL (ref 0.0–0.1)
Basophils Absolute: 0 10*3/uL (ref 0.0–0.1)
Basophils Absolute: 0 K/uL (ref 0.0–0.1)
Basophils Absolute: 0.1 10*3/uL (ref 0.0–0.1)
Basophils Relative: 1 % (ref 0–1)
Basophils Relative: 1 % (ref 0–1)
Basophils Relative: 1 % (ref 0–1)
Basophils Relative: 6 % — ABNORMAL HIGH (ref 0–1)
Eosinophils Absolute: 0.3 10*3/uL (ref 0.0–0.7)
Eosinophils Absolute: 0.4 10*3/uL (ref 0.0–0.7)
Eosinophils Absolute: 0.4 10*3/uL (ref 0.0–0.7)
Eosinophils Absolute: 0.7 K/uL (ref 0.0–0.7)
Eosinophils Relative: 4 % (ref 0–5)
Eosinophils Relative: 5 % (ref 0–5)
Eosinophils Relative: 5 % (ref 0–5)
Eosinophils Relative: 6 % — ABNORMAL HIGH (ref 0–5)
Eosinophils Relative: 9 % — ABNORMAL HIGH (ref 0–5)
Lymphocytes Relative: 29 % (ref 12–46)
Lymphocytes Relative: 32 % (ref 12–46)
Lymphocytes Relative: 35 % (ref 12–46)
Lymphocytes Relative: 36 % (ref 12–46)
Lymphocytes Relative: 39 % (ref 12–46)
Lymphocytes Relative: 39 % (ref 12–46)
Lymphs Abs: 2.8 10*3/uL (ref 0.7–4.0)
Lymphs Abs: 2.8 10*3/uL (ref 0.7–4.0)
Lymphs Abs: 3.3 10*3/uL (ref 0.7–4.0)
Monocytes Absolute: 0.5 10*3/uL (ref 0.1–1.0)
Monocytes Absolute: 0.6 10*3/uL (ref 0.1–1.0)
Monocytes Absolute: 0.6 K/uL (ref 0.1–1.0)
Monocytes Relative: 5 % (ref 3–12)
Monocytes Relative: 6 % (ref 3–12)
Monocytes Relative: 7 % (ref 3–12)
Monocytes Relative: 8 % (ref 3–12)
Neutro Abs: 3.8 K/uL (ref 1.7–7.7)
Neutro Abs: 4.1 10*3/uL (ref 1.7–7.7)
Neutro Abs: 4.1 10*3/uL (ref 1.7–7.7)
Neutro Abs: 4.6 10*3/uL (ref 1.7–7.7)
Neutrophils Relative %: 48 % (ref 43–77)
Neutrophils Relative %: 56 % (ref 43–77)
Neutrophils Relative %: 51 % (ref 43–77)
Neutrophils Relative %: 48 % (ref 43–77)

## 2010-10-12 LAB — HEPATIC FUNCTION PANEL
ALT: 40 U/L — ABNORMAL HIGH (ref 0–35)
AST: 23 U/L (ref 0–37)
Albumin: 4.1 g/dL (ref 3.5–5.2)
Total Protein: 7.7 g/dL (ref 6.0–8.3)

## 2010-10-12 LAB — URINE CULTURE: Colony Count: 9000

## 2010-10-12 LAB — LIPASE, BLOOD
Lipase: 60 U/L — ABNORMAL HIGH (ref 11–59)
Lipase: 53 U/L (ref 11–59)
Lipase: 47 U/L (ref 11–59)

## 2010-10-12 LAB — COMPREHENSIVE METABOLIC PANEL WITH GFR
Albumin: 3.8 g/dL (ref 3.5–5.2)
Alkaline Phosphatase: 115 U/L (ref 39–117)
BUN: 10 mg/dL (ref 6–23)
Chloride: 110 meq/L (ref 96–112)
Potassium: 3.9 meq/L (ref 3.5–5.1)
Total Bilirubin: 0.4 mg/dL (ref 0.3–1.2)

## 2010-10-12 LAB — BASIC METABOLIC PANEL
Chloride: 108 mEq/L (ref 96–112)
GFR calc non Af Amer: >60 mL/min (ref >60)
Potassium: 3.9 mEq/L (ref 3.5–5.1)
Sodium: 140 mEq/L (ref 135–145)

## 2010-10-13 LAB — DIFFERENTIAL
Basophils Absolute: 0 10*3/uL (ref 0.0–0.1)
Basophils Absolute: 0.1 10*3/uL (ref 0.0–0.1)
Basophils Absolute: 0 10*3/uL (ref 0.0–0.1)
Basophils Relative: 1 % (ref 0–1)
Basophils Relative: 1 % (ref 0–1)
Basophils Relative: 0 % (ref 0–1)
Basophils Relative: 1 % (ref 0–1)
Basophils Relative: 1 % (ref 0–1)
Basophils Relative: 1 % (ref 0–1)
Eosinophils Absolute: 0.3 10*3/uL (ref 0.0–0.7)
Eosinophils Absolute: 0.5 10*3/uL (ref 0.0–0.7)
Eosinophils Absolute: 0.2 10*3/uL (ref 0.0–0.7)
Eosinophils Absolute: 0.3 10*3/uL (ref 0.0–0.7)
Eosinophils Absolute: 0.4 10*3/uL (ref 0.0–0.7)
Eosinophils Relative: 4 % (ref 0–5)
Eosinophils Relative: 5 % (ref 0–5)
Lymphocytes Relative: 20 % (ref 12–46)
Lymphocytes Relative: 38 % (ref 12–46)
Lymphocytes Relative: 29 % (ref 12–46)
Lymphs Abs: 1.4 10*3/uL (ref 0.7–4.0)
Lymphs Abs: 3 10*3/uL (ref 0.7–4.0)
Lymphs Abs: 1.8 10*3/uL (ref 0.7–4.0)
Lymphs Abs: 2.7 10*3/uL (ref 0.7–4.0)
Lymphs Abs: 2.7 10*3/uL (ref 0.7–4.0)
Monocytes Absolute: 0.7 10*3/uL (ref 0.1–1.0)
Monocytes Absolute: 0.5 10*3/uL (ref 0.1–1.0)
Monocytes Absolute: 0.7 10*3/uL (ref 0.1–1.0)
Monocytes Relative: 7 % (ref 3–12)
Monocytes Relative: 7 % (ref 3–12)
Monocytes Relative: 7 % (ref 3–12)
Monocytes Relative: 9 % (ref 3–12)
Monocytes Relative: 6 % (ref 3–12)
Monocytes Relative: 7 % (ref 3–12)
Monocytes Relative: 8 % (ref 3–12)
Neutro Abs: 4.9 10*3/uL (ref 1.7–7.7)
Neutro Abs: 3.7 10*3/uL (ref 1.7–7.7)
Neutro Abs: 5.1 10*3/uL (ref 1.7–7.7)
Neutrophils Relative %: 49 % (ref 43–77)
Neutrophils Relative %: 51 % (ref 43–77)
Neutrophils Relative %: 69 % (ref 43–77)
Neutrophils Relative %: 58 % (ref 43–77)
Neutrophils Relative %: 59 % (ref 43–77)

## 2010-10-13 LAB — CBC
HCT: 37.7 % (ref 36.0–46.0)
HCT: 32.5 % — ABNORMAL LOW (ref 36.0–46.0)
HCT: 35.7 % — ABNORMAL LOW (ref 36.0–46.0)
HCT: 37.4 % (ref 36.0–46.0)
HCT: 37.8 % (ref 36.0–46.0)
Hemoglobin: 12.5 g/dL (ref 12.0–15.0)
Hemoglobin: 13.2 g/dL (ref 12.0–15.0)
Hemoglobin: 14.2 g/dL (ref 12.0–15.0)
Hemoglobin: 11.5 g/dL — ABNORMAL LOW (ref 12.0–15.0)
Hemoglobin: 12.4 g/dL (ref 12.0–15.0)
Hemoglobin: 12.5 g/dL (ref 12.0–15.0)
Hemoglobin: 12.9 g/dL (ref 12.0–15.0)
Hemoglobin: 12.9 g/dL (ref 12.0–15.0)
MCHC: 34.6 g/dL (ref 30.0–36.0)
MCHC: 34.7 g/dL (ref 30.0–36.0)
MCHC: 34.1 g/dL (ref 30.0–36.0)
MCHC: 34.3 g/dL (ref 30.0–36.0)
MCHC: 34.6 g/dL (ref 30.0–36.0)
MCHC: 34.8 g/dL (ref 30.0–36.0)
MCHC: 35.2 g/dL (ref 30.0–36.0)
MCV: 80.5 fL (ref 78.0–100.0)
MCV: 81 fL (ref 78.0–100.0)
MCV: 80.9 fL (ref 78.0–100.0)
MCV: 81 fL (ref 78.0–100.0)
MCV: 81.2 fL (ref 78.0–100.0)
MCV: 81.7 fL (ref 78.0–100.0)
Platelets: 239 10*3/uL (ref 150–400)
Platelets: 235 10*3/uL (ref 150–400)
Platelets: 252 10*3/uL (ref 150–400)
Platelets: 319 10*3/uL (ref 150–400)
RBC: 4.45 MIL/uL (ref 3.87–5.11)
RBC: 4.68 MIL/uL (ref 3.87–5.11)
RBC: 4.03 MIL/uL (ref 3.87–5.11)
RBC: 4.52 MIL/uL (ref 3.87–5.11)
RDW: 12.5 % (ref 11.5–15.5)
RDW: 12.6 % (ref 11.5–15.5)
RDW: 12.8 % (ref 11.5–15.5)
RDW: 12.8 % (ref 11.5–15.5)
RDW: 12.3 % (ref 11.5–15.5)
RDW: 12.5 % (ref 11.5–15.5)
WBC: 8.1 10*3/uL (ref 4.0–10.5)
WBC: 8.5 10*3/uL (ref 4.0–10.5)
WBC: 6.2 10*3/uL (ref 4.0–10.5)
WBC: 6.9 10*3/uL (ref 4.0–10.5)
WBC: 8.3 10*3/uL (ref 4.0–10.5)
WBC: 8.5 10*3/uL (ref 4.0–10.5)
WBC: 8.8 10*3/uL (ref 4.0–10.5)

## 2010-10-13 LAB — COMPREHENSIVE METABOLIC PANEL
ALT: 144 U/L — ABNORMAL HIGH (ref 0–35)
ALT: 37 U/L — ABNORMAL HIGH (ref 0–35)
ALT: 43 U/L — ABNORMAL HIGH (ref 0–35)
ALT: 127 U/L — ABNORMAL HIGH (ref 0–35)
ALT: 56 U/L — ABNORMAL HIGH (ref 0–35)
ALT: 90 U/L — ABNORMAL HIGH (ref 0–35)
AST: 21 U/L (ref 0–37)
AST: 40 U/L — ABNORMAL HIGH (ref 0–37)
AST: 71 U/L — ABNORMAL HIGH (ref 0–37)
Albumin: 3.8 g/dL (ref 3.5–5.2)
Albumin: 4 g/dL (ref 3.5–5.2)
Alkaline Phosphatase: 101 U/L (ref 39–117)
Alkaline Phosphatase: 97 U/L (ref 39–117)
Alkaline Phosphatase: 100 U/L (ref 39–117)
Alkaline Phosphatase: 106 U/L (ref 39–117)
Alkaline Phosphatase: 118 U/L — ABNORMAL HIGH (ref 39–117)
BUN: 15 mg/dL (ref 6–23)
BUN: 10 mg/dL (ref 6–23)
BUN: 18 mg/dL (ref 6–23)
BUN: 5 mg/dL — ABNORMAL LOW (ref 6–23)
CO2: 27 mEq/L (ref 19–32)
CO2: 28 mEq/L (ref 19–32)
CO2: 26 mEq/L (ref 19–32)
CO2: 27 mEq/L (ref 19–32)
Calcium: 9.1 mg/dL (ref 8.4–10.5)
Calcium: 9.3 mg/dL (ref 8.4–10.5)
Calcium: 9.2 mg/dL (ref 8.4–10.5)
Calcium: 9.3 mg/dL (ref 8.4–10.5)
Calcium: 9.4 mg/dL (ref 8.4–10.5)
Chloride: 107 mEq/L (ref 96–112)
Chloride: 109 mEq/L (ref 96–112)
Chloride: 110 mEq/L (ref 96–112)
Creatinine, Ser: 0.73 mg/dL (ref 0.4–1.2)
Creatinine, Ser: 0.77 mg/dL (ref 0.4–1.2)
Creatinine, Ser: 0.66 mg/dL (ref 0.4–1.2)
Creatinine, Ser: 0.72 mg/dL (ref 0.4–1.2)
GFR calc Af Amer: >60 mL/min (ref >60)
GFR calc Af Amer: >60 mL/min (ref >60)
GFR calc Af Amer: >60 mL/min (ref >60)
GFR calc Af Amer: >60 mL/min (ref >60)
GFR calc non Af Amer: >60 mL/min (ref >60)
GFR calc non Af Amer: >60 mL/min (ref >60)
GFR calc non Af Amer: >60 mL/min (ref >60)
GFR calc non Af Amer: >60 mL/min (ref >60)
GFR calc non Af Amer: >60 mL/min (ref >60)
Glucose, Bld: 111 mg/dL — ABNORMAL HIGH (ref 70–99)
Glucose, Bld: 83 mg/dL (ref 70–99)
Glucose, Bld: 93 mg/dL (ref 70–99)
Glucose, Bld: 100 mg/dL — ABNORMAL HIGH (ref 70–99)
Glucose, Bld: 100 mg/dL — ABNORMAL HIGH (ref 70–99)
Glucose, Bld: 100 mg/dL — ABNORMAL HIGH (ref 70–99)
Glucose, Bld: 91 mg/dL (ref 70–99)
Potassium: 3.7 mEq/L (ref 3.5–5.1)
Potassium: 4.1 mEq/L (ref 3.5–5.1)
Potassium: 3.8 mEq/L (ref 3.5–5.1)
Potassium: 4 mEq/L (ref 3.5–5.1)
Potassium: 4.1 mEq/L (ref 3.5–5.1)
Sodium: 137 mEq/L (ref 135–145)
Sodium: 139 mEq/L (ref 135–145)
Sodium: 143 mEq/L (ref 135–145)
Sodium: 143 mEq/L (ref 135–145)
Sodium: 140 mEq/L (ref 135–145)
Sodium: 142 mEq/L (ref 135–145)
Sodium: 142 mEq/L (ref 135–145)
Total Bilirubin: 0.2 mg/dL — ABNORMAL LOW (ref 0.3–1.2)
Total Bilirubin: 0.5 mg/dL (ref 0.3–1.2)
Total Bilirubin: 0.5 mg/dL (ref 0.3–1.2)
Total Protein: 7.1 g/dL (ref 6.0–8.3)
Total Protein: 7.2 g/dL (ref 6.0–8.3)
Total Protein: 7.3 g/dL (ref 6.0–8.3)
Total Protein: 7.9 g/dL (ref 6.0–8.3)
Total Protein: 6.8 g/dL (ref 6.0–8.3)
Total Protein: 7.3 g/dL (ref 6.0–8.3)
Total Protein: 7.7 g/dL (ref 6.0–8.3)

## 2010-10-13 LAB — URINALYSIS, ROUTINE W REFLEX MICROSCOPIC
Bilirubin Urine: NEGATIVE
Bilirubin Urine: NEGATIVE
Bilirubin Urine: NEGATIVE
Bilirubin Urine: NEGATIVE
Bilirubin Urine: NEGATIVE
Glucose, UA: NEGATIVE mg/dL
Glucose, UA: NEGATIVE mg/dL
Glucose, UA: NEGATIVE mg/dL
Glucose, UA: NEGATIVE mg/dL
Glucose, UA: NEGATIVE mg/dL
Glucose, UA: NEGATIVE mg/dL
Glucose, UA: NEGATIVE mg/dL
Hgb urine dipstick: NEGATIVE
Hgb urine dipstick: NEGATIVE
Hgb urine dipstick: NEGATIVE
Hgb urine dipstick: NEGATIVE
Hgb urine dipstick: NEGATIVE
Ketones, ur: NEGATIVE mg/dL
Ketones, ur: NEGATIVE mg/dL
Ketones, ur: NEGATIVE mg/dL
Ketones, ur: NEGATIVE mg/dL
Ketones, ur: NEGATIVE mg/dL
Nitrite: NEGATIVE
Nitrite: NEGATIVE
Protein, ur: NEGATIVE mg/dL
Protein, ur: NEGATIVE mg/dL
Protein, ur: NEGATIVE mg/dL
Protein, ur: NEGATIVE mg/dL
Protein, ur: NEGATIVE mg/dL
Specific Gravity, Urine: 1.023 (ref 1.005–1.030)
Specific Gravity, Urine: 1.029 (ref 1.005–1.030)
Specific Gravity, Urine: 1.023 (ref 1.005–1.030)
Specific Gravity, Urine: 1.027 (ref 1.005–1.030)
Specific Gravity, Urine: 1.037 — ABNORMAL HIGH (ref 1.005–1.030)
Urobilinogen, UA: 0.2 mg/dL (ref 0.0–1.0)
Urobilinogen, UA: 1 mg/dL (ref 0.0–1.0)
Urobilinogen, UA: 1 mg/dL (ref 0.0–1.0)
Urobilinogen, UA: 1 mg/dL (ref 0.0–1.0)
Urobilinogen, UA: 0.2 mg/dL (ref 0.0–1.0)
Urobilinogen, UA: 1 mg/dL (ref 0.0–1.0)
pH: 8 (ref 5.0–8.0)
pH: 5.5 (ref 5.0–8.0)
pH: 6.5 (ref 5.0–8.0)

## 2010-10-13 LAB — URINE MICROSCOPIC-ADD ON

## 2010-10-13 LAB — POCT I-STAT, CHEM 8
BUN: 15 mg/dL (ref 6–23)
Calcium, Ion: 1.13 mmol/L (ref 1.12–1.32)
Chloride: 111 mEq/L (ref 96–112)
Creatinine, Ser: 0.8 mg/dL (ref 0.4–1.2)
Glucose, Bld: 79 mg/dL (ref 70–99)
HCT: 34 % — ABNORMAL LOW (ref 36.0–46.0)
Potassium: 3.8 mEq/L (ref 3.5–5.1)

## 2010-10-13 LAB — RAPID URINE DRUG SCREEN, HOSP PERFORMED
Amphetamines: NOT DETECTED
Barbiturates: NOT DETECTED
Benzodiazepines: NOT DETECTED
Cocaine: NOT DETECTED
Cocaine: NOT DETECTED
Opiates: POSITIVE — AB
Tetrahydrocannabinol: NOT DETECTED

## 2010-10-13 LAB — URINE CULTURE

## 2010-10-13 LAB — BASIC METABOLIC PANEL
BUN: 12 mg/dL (ref 6–23)
CO2: 26 mEq/L (ref 19–32)
Chloride: 107 mEq/L (ref 96–112)
Potassium: 3.9 mEq/L (ref 3.5–5.1)

## 2010-10-13 LAB — POCT PREGNANCY, URINE
Preg Test, Ur: NEGATIVE
Preg Test, Ur: NEGATIVE
Preg Test, Ur: NEGATIVE
Preg Test, Ur: NEGATIVE

## 2010-10-13 LAB — LIPASE, BLOOD
Lipase: 31 U/L (ref 11–59)
Lipase: 38 U/L (ref 11–59)
Lipase: 330 U/L — ABNORMAL HIGH (ref 11–59)
Lipase: 42 U/L (ref 11–59)
Lipase: 45 U/L (ref 11–59)
Lipase: 48 U/L (ref 11–59)

## 2010-10-13 LAB — HEMOCCULT GUIAC POC 1CARD (OFFICE): Fecal Occult Bld: NEGATIVE

## 2010-10-14 LAB — COMPREHENSIVE METABOLIC PANEL
ALT: 38 U/L — ABNORMAL HIGH (ref 0–35)
ALT: 39 U/L — ABNORMAL HIGH (ref 0–35)
ALT: 46 U/L — ABNORMAL HIGH (ref 0–35)
ALT: 17 U/L (ref 0–35)
ALT: 20 U/L (ref 0–35)
AST: 66 U/L — ABNORMAL HIGH (ref 0–37)
AST: 23 U/L (ref 0–37)
AST: 24 U/L (ref 0–37)
AST: 20 U/L (ref 0–37)
AST: 20 U/L (ref 0–37)
Albumin: 3.2 g/dL — ABNORMAL LOW (ref 3.5–5.2)
Albumin: 3.3 g/dL — ABNORMAL LOW (ref 3.5–5.2)
Albumin: 3.7 g/dL (ref 3.5–5.2)
Albumin: 3.9 g/dL (ref 3.5–5.2)
Albumin: 4 g/dL (ref 3.5–5.2)
Albumin: 4.1 g/dL (ref 3.5–5.2)
Alkaline Phosphatase: 100 U/L (ref 39–117)
Alkaline Phosphatase: 118 U/L — ABNORMAL HIGH (ref 39–117)
Alkaline Phosphatase: 100 U/L (ref 39–117)
Alkaline Phosphatase: 99 U/L (ref 39–117)
Alkaline Phosphatase: 92 U/L (ref 39–117)
BUN: 10 mg/dL (ref 6–23)
BUN: 11 mg/dL (ref 6–23)
BUN: 13 mg/dL (ref 6–23)
BUN: 14 mg/dL (ref 6–23)
BUN: 15 mg/dL (ref 6–23)
CO2: 24 mEq/L (ref 19–32)
CO2: 24 mEq/L (ref 19–32)
CO2: 24 mEq/L (ref 19–32)
Calcium: 8.5 mg/dL (ref 8.4–10.5)
Calcium: 8.8 mg/dL (ref 8.4–10.5)
Calcium: 8.7 mg/dL (ref 8.4–10.5)
Calcium: 8.9 mg/dL (ref 8.4–10.5)
Calcium: 8.7 mg/dL (ref 8.4–10.5)
Chloride: 107 mEq/L (ref 96–112)
Chloride: 109 mEq/L (ref 96–112)
Chloride: 105 mEq/L (ref 96–112)
Chloride: 107 mEq/L (ref 96–112)
Chloride: 108 mEq/L (ref 96–112)
Creatinine, Ser: 0.68 mg/dL (ref 0.4–1.2)
Creatinine, Ser: 0.69 mg/dL (ref 0.4–1.2)
Creatinine, Ser: 0.65 mg/dL (ref 0.4–1.2)
Creatinine, Ser: 0.68 mg/dL (ref 0.4–1.2)
Creatinine, Ser: 0.83 mg/dL (ref 0.4–1.2)
Creatinine, Ser: 0.91 mg/dL (ref 0.4–1.2)
GFR calc Af Amer: >60 mL/min (ref >60)
GFR calc Af Amer: >60 mL/min (ref >60)
GFR calc Af Amer: >60 mL/min (ref >60)
GFR calc Af Amer: >60 mL/min (ref >60)
GFR calc non Af Amer: >60 mL/min (ref >60)
GFR calc non Af Amer: >60 mL/min (ref >60)
GFR calc non Af Amer: >60 mL/min (ref >60)
GFR calc non Af Amer: >60 mL/min (ref >60)
GFR calc non Af Amer: >60 mL/min (ref >60)
Glucose, Bld: 102 mg/dL — ABNORMAL HIGH (ref 70–99)
Glucose, Bld: 90 mg/dL (ref 70–99)
Glucose, Bld: 100 mg/dL — ABNORMAL HIGH (ref 70–99)
Glucose, Bld: 96 mg/dL (ref 70–99)
Glucose, Bld: 88 mg/dL (ref 70–99)
Glucose, Bld: 98 mg/dL (ref 70–99)
Potassium: 3.7 mEq/L (ref 3.5–5.1)
Potassium: 3.9 mEq/L (ref 3.5–5.1)
Potassium: 3.5 mEq/L (ref 3.5–5.1)
Potassium: 3.6 mEq/L (ref 3.5–5.1)
Potassium: 3.8 mEq/L (ref 3.5–5.1)
Sodium: 140 mEq/L (ref 135–145)
Sodium: 137 mEq/L (ref 135–145)
Sodium: 139 mEq/L (ref 135–145)
Sodium: 138 mEq/L (ref 135–145)
Total Bilirubin: 0.4 mg/dL (ref 0.3–1.2)
Total Bilirubin: 0.5 mg/dL (ref 0.3–1.2)
Total Bilirubin: 0.6 mg/dL (ref 0.3–1.2)
Total Bilirubin: 0.6 mg/dL (ref 0.3–1.2)
Total Bilirubin: 0.9 mg/dL (ref 0.3–1.2)
Total Protein: 6.6 g/dL (ref 6.0–8.3)
Total Protein: 7.1 g/dL (ref 6.0–8.3)
Total Protein: 7.5 g/dL (ref 6.0–8.3)
Total Protein: 7.2 g/dL (ref 6.0–8.3)
Total Protein: 7.4 g/dL (ref 6.0–8.3)
Total Protein: 7.3 g/dL (ref 6.0–8.3)

## 2010-10-14 LAB — URINALYSIS, ROUTINE W REFLEX MICROSCOPIC
Bilirubin Urine: NEGATIVE
Bilirubin Urine: NEGATIVE
Glucose, UA: NEGATIVE mg/dL
Glucose, UA: NEGATIVE mg/dL
Glucose, UA: NEGATIVE mg/dL
Hgb urine dipstick: NEGATIVE
Hgb urine dipstick: NEGATIVE
Hgb urine dipstick: NEGATIVE
Hgb urine dipstick: NEGATIVE
Ketones, ur: NEGATIVE mg/dL
Ketones, ur: NEGATIVE mg/dL
Ketones, ur: NEGATIVE mg/dL
Nitrite: NEGATIVE
Nitrite: NEGATIVE
Nitrite: NEGATIVE
Nitrite: NEGATIVE
Nitrite: NEGATIVE
Protein, ur: NEGATIVE mg/dL
Protein, ur: 30 mg/dL — AB
Protein, ur: NEGATIVE mg/dL
Specific Gravity, Urine: 1.012 (ref 1.005–1.030)
Specific Gravity, Urine: 1.036 — ABNORMAL HIGH (ref 1.005–1.030)
Specific Gravity, Urine: 1.023 (ref 1.005–1.030)
Specific Gravity, Urine: 1.012 (ref 1.005–1.030)
Specific Gravity, Urine: 1.025 (ref 1.005–1.030)
Specific Gravity, Urine: 1.034 — ABNORMAL HIGH (ref 1.005–1.030)
Urobilinogen, UA: 0.2 mg/dL (ref 0.0–1.0)
Urobilinogen, UA: 0.2 mg/dL (ref 0.0–1.0)
Urobilinogen, UA: 1 mg/dL (ref 0.0–1.0)
pH: 6 (ref 5.0–8.0)
pH: 6 (ref 5.0–8.0)
pH: 6.5 (ref 5.0–8.0)
pH: 7.5 (ref 5.0–8.0)

## 2010-10-14 LAB — DIFFERENTIAL
Basophils Absolute: 0 10*3/uL (ref 0.0–0.1)
Basophils Absolute: 0 10*3/uL (ref 0.0–0.1)
Basophils Absolute: 0 10*3/uL (ref 0.0–0.1)
Basophils Absolute: 0.1 10*3/uL (ref 0.0–0.1)
Basophils Relative: 1 % (ref 0–1)
Basophils Relative: 1 % (ref 0–1)
Basophils Relative: 0 % (ref 0–1)
Basophils Relative: 1 % (ref 0–1)
Basophils Relative: 1 % (ref 0–1)
Basophils Relative: 1 % (ref 0–1)
Eosinophils Absolute: 0.4 10*3/uL (ref 0.0–0.7)
Eosinophils Absolute: 0.4 10*3/uL (ref 0.0–0.7)
Eosinophils Absolute: 0.6 10*3/uL (ref 0.0–0.7)
Eosinophils Absolute: 0.6 10*3/uL (ref 0.0–0.7)
Eosinophils Absolute: 0.3 10*3/uL (ref 0.0–0.7)
Eosinophils Absolute: 0.5 10*3/uL (ref 0.0–0.7)
Eosinophils Relative: 5 % (ref 0–5)
Eosinophils Relative: 6 % — ABNORMAL HIGH (ref 0–5)
Eosinophils Relative: 7 % — ABNORMAL HIGH (ref 0–5)
Eosinophils Relative: 6 % — ABNORMAL HIGH (ref 0–5)
Eosinophils Relative: 6 % — ABNORMAL HIGH (ref 0–5)
Lymphocytes Relative: 31 % (ref 12–46)
Lymphocytes Relative: 33 % (ref 12–46)
Lymphocytes Relative: 34 % (ref 12–46)
Lymphocytes Relative: 37 % (ref 12–46)
Lymphs Abs: 2.3 10*3/uL (ref 0.7–4.0)
Lymphs Abs: 2.6 10*3/uL (ref 0.7–4.0)
Lymphs Abs: 3.1 10*3/uL (ref 0.7–4.0)
Lymphs Abs: 2.1 10*3/uL (ref 0.7–4.0)
Monocytes Absolute: 0.5 10*3/uL (ref 0.1–1.0)
Monocytes Absolute: 0.8 10*3/uL (ref 0.1–1.0)
Monocytes Absolute: 0.8 10*3/uL (ref 0.1–1.0)
Monocytes Absolute: 0.6 10*3/uL (ref 0.1–1.0)
Monocytes Absolute: 1 10*3/uL (ref 0.1–1.0)
Monocytes Relative: 10 % (ref 3–12)
Monocytes Relative: 7 % (ref 3–12)
Monocytes Relative: 8 % (ref 3–12)
Monocytes Relative: 9 % (ref 3–12)
Monocytes Relative: 8 % (ref 3–12)
Neutro Abs: 4 10*3/uL (ref 1.7–7.7)
Neutro Abs: 4.9 10*3/uL (ref 1.7–7.7)
Neutro Abs: 5.1 10*3/uL (ref 1.7–7.7)
Neutro Abs: 5.2 10*3/uL (ref 1.7–7.7)
Neutrophils Relative %: 42 % — ABNORMAL LOW (ref 43–77)
Neutrophils Relative %: 56 % (ref 43–77)
Neutrophils Relative %: 52 % (ref 43–77)
Neutrophils Relative %: 56 % (ref 43–77)
Neutrophils Relative %: 45 % (ref 43–77)
Neutrophils Relative %: 51 % (ref 43–77)

## 2010-10-14 LAB — CBC
HCT: 35.1 % — ABNORMAL LOW (ref 36.0–46.0)
HCT: 37 % (ref 36.0–46.0)
HCT: 36 % (ref 36.0–46.0)
HCT: 37 % (ref 36.0–46.0)
HCT: 39.1 % (ref 36.0–46.0)
Hemoglobin: 12.4 g/dL (ref 12.0–15.0)
Hemoglobin: 12.6 g/dL (ref 12.0–15.0)
Hemoglobin: 13.4 g/dL (ref 12.0–15.0)
Hemoglobin: 12.4 g/dL (ref 12.0–15.0)
Hemoglobin: 12.8 g/dL (ref 12.0–15.0)
Hemoglobin: 12.1 g/dL (ref 12.0–15.0)
Hemoglobin: 13.3 g/dL (ref 12.0–15.0)
MCHC: 34 g/dL (ref 30.0–36.0)
MCHC: 34.2 g/dL (ref 30.0–36.0)
MCHC: 34.3 g/dL (ref 30.0–36.0)
MCHC: 34.5 g/dL (ref 30.0–36.0)
MCHC: 34.5 g/dL (ref 30.0–36.0)
MCHC: 34.1 g/dL (ref 30.0–36.0)
MCHC: 34.1 g/dL (ref 30.0–36.0)
MCV: 81.3 fL (ref 78.0–100.0)
MCV: 81.6 fL (ref 78.0–100.0)
MCV: 80.9 fL (ref 78.0–100.0)
MCV: 81 fL (ref 78.0–100.0)
MCV: 81.7 fL (ref 78.0–100.0)
MCV: 81.9 fL (ref 78.0–100.0)
Platelets: 234 10*3/uL (ref 150–400)
Platelets: 282 10*3/uL (ref 150–400)
Platelets: 327 10*3/uL (ref 150–400)
Platelets: 273 10*3/uL (ref 150–400)
Platelets: 300 10*3/uL (ref 150–400)
RBC: 4.42 MIL/uL (ref 3.87–5.11)
RBC: 4.78 MIL/uL (ref 3.87–5.11)
RBC: 4.45 MIL/uL (ref 3.87–5.11)
RBC: 4.56 MIL/uL (ref 3.87–5.11)
RBC: 4.34 MIL/uL (ref 3.87–5.11)
RBC: 4.79 MIL/uL (ref 3.87–5.11)
RDW: 12.6 % (ref 11.5–15.5)
RDW: 12.6 % (ref 11.5–15.5)
RDW: 12.7 % (ref 11.5–15.5)
RDW: 12.6 % (ref 11.5–15.5)
RDW: 12.6 % (ref 11.5–15.5)
RDW: 12.1 % (ref 11.5–15.5)
RDW: 12.2 % (ref 11.5–15.5)
WBC: 6.2 10*3/uL (ref 4.0–10.5)
WBC: 9.2 10*3/uL (ref 4.0–10.5)
WBC: 9.4 10*3/uL (ref 4.0–10.5)
WBC: 10.2 10*3/uL (ref 4.0–10.5)
WBC: 5.6 10*3/uL (ref 4.0–10.5)
WBC: 8 10*3/uL (ref 4.0–10.5)

## 2010-10-14 LAB — URINE MICROSCOPIC-ADD ON

## 2010-10-14 LAB — LIPASE, BLOOD
Lipase: 31 U/L (ref 11–59)
Lipase: 38 U/L (ref 11–59)
Lipase: 45 U/L (ref 11–59)
Lipase: 48 U/L (ref 11–59)
Lipase: 49 U/L (ref 11–59)
Lipase: 65 U/L — ABNORMAL HIGH (ref 11–59)

## 2010-10-14 LAB — BASIC METABOLIC PANEL
CO2: 30 mEq/L (ref 19–32)
Chloride: 104 mEq/L (ref 96–112)
GFR calc Af Amer: >60 mL/min (ref >60)
Sodium: 141 mEq/L (ref 135–145)

## 2010-10-14 LAB — AMYLASE: Amylase: 94 U/L (ref 27–131)

## 2010-10-14 LAB — PREGNANCY, URINE: Preg Test, Ur: NEGATIVE

## 2010-10-14 LAB — RAPID URINE DRUG SCREEN, HOSP PERFORMED
Amphetamines: NOT DETECTED
Barbiturates: NOT DETECTED
Barbiturates: NOT DETECTED
Benzodiazepines: NOT DETECTED
Tetrahydrocannabinol: NOT DETECTED

## 2010-10-14 LAB — POCT CARDIAC MARKERS: CKMB, poc: <1 ng/mL — ABNORMAL LOW (ref 1.0–8.0)

## 2010-10-14 LAB — POCT PREGNANCY, URINE
Preg Test, Ur: NEGATIVE
Preg Test, Ur: NEGATIVE

## 2010-10-14 LAB — URINE CULTURE: Colony Count: NO GROWTH

## 2010-10-14 LAB — ETHANOL: Alcohol, Ethyl (B): <5 mg/dL (ref 0–10)

## 2010-10-15 ENCOUNTER — Ambulatory Visit: Payer: Medicaid Other | Admitting: Obstetrics & Gynecology

## 2010-10-15 LAB — COMPREHENSIVE METABOLIC PANEL
ALT: 33 U/L (ref 0–35)
ALT: 26 U/L (ref 0–35)
AST: 27 U/L (ref 0–37)
AST: 62 U/L — ABNORMAL HIGH (ref 0–37)
AST: 63 U/L — ABNORMAL HIGH (ref 0–37)
Albumin: 4.7 g/dL (ref 3.5–5.2)
BUN: 12 mg/dL (ref 6–23)
BUN: 10 mg/dL (ref 6–23)
CO2: 24 mEq/L (ref 19–32)
Calcium: 9.4 mg/dL (ref 8.4–10.5)
Chloride: 103 mEq/L (ref 96–112)
Creatinine, Ser: 0.71 mg/dL (ref 0.4–1.2)
Creatinine, Ser: 0.75 mg/dL (ref 0.4–1.2)
GFR calc Af Amer: >60 mL/min (ref >60)
GFR calc Af Amer: >60 mL/min (ref >60)
GFR calc Af Amer: >60 mL/min (ref >60)
GFR calc non Af Amer: >60 mL/min (ref >60)
GFR calc non Af Amer: >60 mL/min (ref >60)
Potassium: 4.3 mEq/L (ref 3.5–5.1)
Sodium: 139 mEq/L (ref 135–145)
Total Bilirubin: 0.5 mg/dL (ref 0.3–1.2)
Total Bilirubin: 0.8 mg/dL (ref 0.3–1.2)
Total Bilirubin: 0.8 mg/dL (ref 0.3–1.2)
Total Protein: 8.1 g/dL (ref 6.0–8.3)

## 2010-10-15 LAB — CBC
HCT: 40.8 % (ref 36.0–46.0)
MCHC: 33.7 g/dL (ref 30.0–36.0)
MCV: 82.9 fL (ref 78.0–100.0)
Platelets: 295 10*3/uL (ref 150–400)
RBC: 4.84 MIL/uL (ref 3.87–5.11)
RDW: 12.4 % (ref 11.5–15.5)
WBC: 8.8 10*3/uL (ref 4.0–10.5)
WBC: 9.2 10*3/uL (ref 4.0–10.5)

## 2010-10-15 LAB — DIFFERENTIAL
Basophils Absolute: 0 10*3/uL (ref 0.0–0.1)
Eosinophils Absolute: 0.3 10*3/uL (ref 0.0–0.7)
Eosinophils Relative: 8 % — ABNORMAL HIGH (ref 0–5)
Eosinophils Relative: 3 % (ref 0–5)
Lymphocytes Relative: 32 % (ref 12–46)
Lymphs Abs: 2.8 10*3/uL (ref 0.7–4.0)
Lymphs Abs: 2.3 10*3/uL (ref 0.7–4.0)
Monocytes Absolute: 0.6 10*3/uL (ref 0.1–1.0)
Monocytes Absolute: 0.6 10*3/uL (ref 0.1–1.0)
Neutro Abs: 4.7 10*3/uL (ref 1.7–7.7)

## 2010-10-15 LAB — URINE MICROSCOPIC-ADD ON

## 2010-10-15 LAB — LIPASE, BLOOD
Lipase: 56 U/L (ref 11–59)
Lipase: 26 U/L (ref 11–59)

## 2010-10-15 LAB — URINALYSIS, ROUTINE W REFLEX MICROSCOPIC
Bilirubin Urine: NEGATIVE
Bilirubin Urine: NEGATIVE
Glucose, UA: NEGATIVE mg/dL
Hgb urine dipstick: NEGATIVE
Hgb urine dipstick: NEGATIVE
Ketones, ur: NEGATIVE mg/dL
Nitrite: NEGATIVE
Protein, ur: NEGATIVE mg/dL
Specific Gravity, Urine: 1.025 (ref 1.005–1.030)
Specific Gravity, Urine: 1.021 (ref 1.005–1.030)
Urobilinogen, UA: 1 mg/dL (ref 0.0–1.0)
pH: 6 (ref 5.0–8.0)

## 2010-10-15 NOTE — H&P (Signed)
[name], [name]               ACCOUNT NO.:  [id number]  MEDICAL RECORD NO.:  [id number]           PATIENT TYPE:  LOCATION:                                 FACILITY:  PHYSICIAN:  Massie Maroon, MD        DATE OF BIRTH:  July 27, 1980  DATE OF ADMISSION: DATE OF DISCHARGE:                             HISTORY & PHYSICAL   CHIEF COMPLAINT:  Abdominal pain.  HISTORY OF PRESENT ILLNESS:  This is a 30 year old female with complaints of right-sided sharp and dull and throbbing abdominal pain starting on September 06, 2010.  Apparently, it was occurring intermittently but then on September 16, 2010, it became worse and more constant and tonight it was unbearable and associated with nausea, so she came to the ER for evaluation.  She notes that she has had some slight diarrhea nine times per day, but denies any fever, chills, cough, chest pain, palpitations, shortness of breath, bright red blood per rectum, or black stool or dysuria or hematuria.  The patient states that her pain is consistent with her prior pancreatitis pain.  She was evaluated in the ED, and her lipase was elevated at 82.  Her liver function tests were within normal limits.  She denies alcohol use.  Abdominal flat and upright was negative.  The patient will be admitted with pancreatitis.  PAST MEDICAL HISTORY: 1. Chronic abdominal pain. 2. Status post cholecystectomy. 3. Chronic pancreatitis. 4. History of abnormal liver function tests in the past, unclear     etiology. 5. History of asthma. 6. History of hypokalemia. 7. Fibroids. 8. Status post hysterectomy. 9. History of exploratory laparoscopy x2 for lysis of adhesions, ERCP     in 2010 and MRCP which were reportedly negative. 10.History of pain medication seeking behavior in the past.  SOCIAL HISTORY:  The patient in the past was reported to be a smoker although she currently denies this.  She denies any use of alcohol or illicit drugs.  She lives with her children  who are 21 and 6 in [place].  FAMILY HISTORY:  Her parents have diabetes and her father has hypertension.  There is also a history of sickle cell in her family.  ALLERGIES:  ASPIRIN, DEMEROL, IBUPROFEN, MORPHINE, PROTONIX, TORADOL, TYLENOL, ULTRAM, all of these apparently cause hives.  She is able to take Dilaudid and requests Benadryl with this.  CURRENT MEDICATIONS:  Estradiol, multivitamin.  REVIEW OF SYSTEMS:  Negative for all 10 organ systems except for pertinent positives stated above.  PHYSICAL EXAMINATION:  VITAL SIGNS:  Temperature 98.0, pulse 84, blood pressure 130/84, pulse ox 99% on room air. HEENT:  Anicteric, EOMI, no nystagmus, pupils 1.5 mm, symmetric. Direct, consensual, and near reflexes intact.  Mucous membranes moist. NECK:  No JVD, no bruit, no thyromegaly, no adenopathy. HEART:  Regular rate and rhythm.  S1 and S2.  No murmurs, gallops, or rubs. LUNGS:  Clear to auscultation bilaterally. ABDOMEN:  Soft, nontender, nondistended.  Positive bowel sounds. EXTREMITIES:  No cyanosis, clubbing, or edema. SKIN:  No rashes. LYMPH NODES:  No adenopathy. NEUROLOGIC:  Nonfocal. SKIN:  No rashes.  Negative  [name] sign.  LABORATORY DATA:  WBC 9.0, hemoglobin 12.2, platelet count 290, amylase 116, lipase 82.  Sodium 136, potassium 3.5, BUN 14, creatinine 0.57, AST 23, ALT 31, alk phos 65, total bilirubin 0.2.  Abdominal flat and upright negative for any acute process.  ASSESSMENT AND PLAN: 1. Pancreatitis, acute on chronic pain:  The patient will be treated     with Dilaudid as well as normal saline IV.  We will recheck CBC,     CMP, amylase, lipase in the a.m. 2. Nausea, diarrhea:  Consider stool testing for fecal leukocytes,     culture, and Clostridium difficile if she continues to have     diarrhea.  This may be due to chronic pancreatic insufficiency. 3. History of abnormal liver function tests, now normal.  I reviewed     her records and did not see any  celiac panel, ceruloplasmin, iron     studies, alpha-1 antitrypsin.  Potassium is done.  If she does have     further abnormal liver function tests, then I will test her for     these things. 4. Deep venous thrombosis prophylaxis.  SCDs.     Massie Maroon, MD     JYK/MEDQ  D:  09/21/2010  T:  09/21/2010  Job:  [id number]  cc:   [facility]  Electronically Signed by Pearson Grippe MD on 10/15/2010 01:50:40 AM

## 2010-10-19 LAB — COMPREHENSIVE METABOLIC PANEL
ALT: 246 U/L — ABNORMAL HIGH (ref 0–35)
ALT: 339 U/L — ABNORMAL HIGH (ref 0–35)
AST: 181 U/L — ABNORMAL HIGH (ref 0–37)
Albumin: 3.4 g/dL — ABNORMAL LOW (ref 3.5–5.2)
Albumin: 3.1 g/dL — ABNORMAL LOW (ref 3.5–5.2)
Albumin: 3.7 g/dL (ref 3.5–5.2)
Alkaline Phosphatase: 81 U/L (ref 39–117)
Alkaline Phosphatase: 98 U/L (ref 39–117)
Alkaline Phosphatase: 121 U/L — ABNORMAL HIGH (ref 39–117)
Alkaline Phosphatase: 153 U/L — ABNORMAL HIGH (ref 39–117)
BUN: 5 mg/dL — ABNORMAL LOW (ref 6–23)
BUN: 5 mg/dL — ABNORMAL LOW (ref 6–23)
BUN: 4 mg/dL — ABNORMAL LOW (ref 6–23)
CO2: 32 mEq/L (ref 19–32)
CO2: 25 mEq/L (ref 19–32)
CO2: 29 mEq/L (ref 19–32)
Calcium: 8.7 mg/dL (ref 8.4–10.5)
Chloride: 103 mEq/L (ref 96–112)
Chloride: 104 mEq/L (ref 96–112)
Chloride: 109 mEq/L (ref 96–112)
Creatinine, Ser: 0.76 mg/dL (ref 0.4–1.2)
Creatinine, Ser: 0.77 mg/dL (ref 0.4–1.2)
Creatinine, Ser: 0.75 mg/dL (ref 0.4–1.2)
GFR calc Af Amer: >60 mL/min (ref >60)
GFR calc non Af Amer: >60 mL/min (ref >60)
GFR calc non Af Amer: >60 mL/min (ref >60)
GFR calc non Af Amer: >60 mL/min (ref >60)
Glucose, Bld: 92 mg/dL (ref 70–99)
Glucose, Bld: 140 mg/dL — ABNORMAL HIGH (ref 70–99)
Glucose, Bld: 92 mg/dL (ref 70–99)
Potassium: 3.7 mEq/L (ref 3.5–5.1)
Potassium: 4 mEq/L (ref 3.5–5.1)
Potassium: 3.5 mEq/L (ref 3.5–5.1)
Potassium: 3.8 mEq/L (ref 3.5–5.1)
Sodium: 137 mEq/L (ref 135–145)
Sodium: 140 mEq/L (ref 135–145)
Total Bilirubin: 0.5 mg/dL (ref 0.3–1.2)
Total Bilirubin: 0.9 mg/dL (ref 0.3–1.2)
Total Bilirubin: 1.5 mg/dL — ABNORMAL HIGH (ref 0.3–1.2)
Total Protein: 6.4 g/dL (ref 6.0–8.3)
Total Protein: 7.2 g/dL (ref 6.0–8.3)

## 2010-10-19 LAB — CBC
HCT: 36.1 % (ref 36.0–46.0)
HCT: 38.1 % (ref 36.0–46.0)
HCT: 39.7 % (ref 36.0–46.0)
HCT: 33.8 % — ABNORMAL LOW (ref 36.0–46.0)
Hemoglobin: 12.9 g/dL (ref 12.0–15.0)
Hemoglobin: 11.4 g/dL — ABNORMAL LOW (ref 12.0–15.0)
Hemoglobin: 12.4 g/dL (ref 12.0–15.0)
MCHC: 33.8 g/dL (ref 30.0–36.0)
MCV: 82.4 fL (ref 78.0–100.0)
MCV: 83.1 fL (ref 78.0–100.0)
MCV: 82.9 fL (ref 78.0–100.0)
Platelets: 251 10*3/uL (ref 150–400)
Platelets: 278 10*3/uL (ref 150–400)
Platelets: 265 10*3/uL (ref 150–400)
Platelets: 272 10*3/uL (ref 150–400)
Platelets: 293 10*3/uL (ref 150–400)
RBC: 4.82 MIL/uL (ref 3.87–5.11)
RBC: 4.06 MIL/uL (ref 3.87–5.11)
RBC: 4.07 MIL/uL (ref 3.87–5.11)
RDW: 12.2 % (ref 11.5–15.5)
RDW: 12.1 % (ref 11.5–15.5)
WBC: 5.9 10*3/uL (ref 4.0–10.5)
WBC: 8.1 10*3/uL (ref 4.0–10.5)
WBC: 13.3 10*3/uL — ABNORMAL HIGH (ref 4.0–10.5)
WBC: 8.1 10*3/uL (ref 4.0–10.5)
WBC: 8.7 10*3/uL (ref 4.0–10.5)

## 2010-10-19 LAB — DIFFERENTIAL
Basophils Absolute: 0 10*3/uL (ref 0.0–0.1)
Basophils Relative: 1 % (ref 0–1)
Basophils Relative: 1 % (ref 0–1)
Eosinophils Absolute: 1 10*3/uL — ABNORMAL HIGH (ref 0.0–0.7)
Eosinophils Absolute: 0.2 10*3/uL (ref 0.0–0.7)
Eosinophils Relative: 12 % — ABNORMAL HIGH (ref 0–5)
Eosinophils Relative: 1 % (ref 0–5)
Lymphocytes Relative: 27 % (ref 12–46)
Lymphocytes Relative: 35 % (ref 12–46)
Lymphocytes Relative: 35 % (ref 12–46)
Lymphs Abs: 2.2 10*3/uL (ref 0.7–4.0)
Lymphs Abs: 2.5 10*3/uL (ref 0.7–4.0)
Monocytes Absolute: 0.5 10*3/uL (ref 0.1–1.0)
Monocytes Absolute: 0.5 10*3/uL (ref 0.1–1.0)
Monocytes Absolute: 0.9 10*3/uL (ref 0.1–1.0)
Monocytes Relative: 8 % (ref 3–12)
Monocytes Relative: 8 % (ref 3–12)
Monocytes Relative: 7 % (ref 3–12)
Neutro Abs: 2.5 10*3/uL (ref 1.7–7.7)
Neutro Abs: 3 10*3/uL (ref 1.7–7.7)
Neutrophils Relative %: 42 % — ABNORMAL LOW (ref 43–77)
Neutrophils Relative %: 43 % (ref 43–77)

## 2010-10-19 LAB — URINALYSIS, ROUTINE W REFLEX MICROSCOPIC
Bilirubin Urine: NEGATIVE
Glucose, UA: NEGATIVE mg/dL
Glucose, UA: NEGATIVE mg/dL
Glucose, UA: NEGATIVE mg/dL
Hgb urine dipstick: NEGATIVE
Hgb urine dipstick: NEGATIVE
Hgb urine dipstick: NEGATIVE
Protein, ur: NEGATIVE mg/dL
Protein, ur: NEGATIVE mg/dL
Specific Gravity, Urine: 1.009 (ref 1.005–1.030)
pH: 6 (ref 5.0–8.0)
pH: 7.5 (ref 5.0–8.0)

## 2010-10-19 LAB — HEPATIC FUNCTION PANEL
ALT: 235 U/L — ABNORMAL HIGH (ref 0–35)
Albumin: 3.1 g/dL — ABNORMAL LOW (ref 3.5–5.2)
Alkaline Phosphatase: 122 U/L — ABNORMAL HIGH (ref 39–117)
Indirect Bilirubin: 0.4 mg/dL (ref 0.3–0.9)
Total Protein: 6.5 g/dL (ref 6.0–8.3)

## 2010-10-19 LAB — URINE MICROSCOPIC-ADD ON

## 2010-10-19 LAB — POCT I-STAT, CHEM 8
BUN: 11 mg/dL (ref 6–23)
Calcium, Ion: 1.19 mmol/L (ref 1.12–1.32)
Creatinine, Ser: 0.7 mg/dL (ref 0.4–1.2)
Glucose, Bld: 128 mg/dL — ABNORMAL HIGH (ref 70–99)
HCT: 38 % (ref 36.0–46.0)
Hemoglobin: 12.9 g/dL (ref 12.0–15.0)

## 2010-10-19 LAB — LIPID PANEL
HDL: 44 mg/dL (ref >39)
Triglycerides: 82 mg/dL (ref <150)
VLDL: 16 mg/dL (ref 0–40)

## 2010-10-19 LAB — OVA AND PARASITE EXAMINATION: Ova and parasites: NONE SEEN

## 2010-10-19 LAB — LIPASE, BLOOD: Lipase: 149 U/L — ABNORMAL HIGH (ref 11–59)

## 2010-10-20 ENCOUNTER — Emergency Department (HOSPITAL_COMMUNITY)
Admission: EM | Admit: 2010-10-20 | Discharge: 2010-10-21 | Discharge status: Against Medical Advice | Payer: Medicaid Other | Attending: Emergency Medicine | Admitting: Emergency Medicine

## 2010-10-20 DIAGNOSIS — Z8619 Personal history of other infectious and parasitic diseases: Secondary | ICD-10-CM | POA: Insufficient documentation

## 2010-10-20 DIAGNOSIS — R1013 Epigastric pain: Secondary | ICD-10-CM | POA: Insufficient documentation

## 2010-10-20 DIAGNOSIS — K861 Other chronic pancreatitis: Secondary | ICD-10-CM | POA: Insufficient documentation

## 2010-10-20 DIAGNOSIS — R112 Nausea with vomiting, unspecified: Secondary | ICD-10-CM | POA: Insufficient documentation

## 2010-10-20 DIAGNOSIS — J45909 Unspecified asthma, uncomplicated: Secondary | ICD-10-CM | POA: Insufficient documentation

## 2010-10-20 LAB — COMPREHENSIVE METABOLIC PANEL
ALT: 19 U/L (ref 0–35)
AST: 20 U/L (ref 0–37)
Alkaline Phosphatase: 108 U/L (ref 39–117)
CO2: 26 mEq/L (ref 19–32)
Calcium: 9.1 mg/dL (ref 8.4–10.5)
Chloride: 103 mEq/L (ref 96–112)
GFR calc Af Amer: >60 mL/min (ref >60)
GFR calc non Af Amer: >60 mL/min (ref >60)
Glucose, Bld: 88 mg/dL (ref 70–99)
Potassium: 3.6 mEq/L (ref 3.5–5.1)
Sodium: 137 mEq/L (ref 135–145)
Total Bilirubin: 0.2 mg/dL — ABNORMAL LOW (ref 0.3–1.2)

## 2010-10-20 LAB — CULTURE, ROUTINE-ABSCESS: Gram Stain: NONE SEEN

## 2010-10-20 LAB — RAPID URINE DRUG SCREEN, HOSP PERFORMED
Amphetamines: NOT DETECTED
Opiates: POSITIVE — AB
Tetrahydrocannabinol: NOT DETECTED

## 2010-10-20 LAB — DIFFERENTIAL
Basophils Relative: 1 % (ref 0–1)
Eosinophils Absolute: 0.4 10*3/uL (ref 0.0–0.7)
Eosinophils Relative: 5 % (ref 0–5)
Lymphs Abs: 3.2 10*3/uL (ref 0.7–4.0)
Neutrophils Relative %: 52 % (ref 43–77)

## 2010-10-20 LAB — LIPASE, BLOOD: Lipase: 38 U/L (ref 11–59)

## 2010-10-20 LAB — CBC
Hemoglobin: 13.4 g/dL (ref 12.0–15.0)
RBC: 4.65 MIL/uL (ref 3.87–5.11)
WBC: 8.8 10*3/uL (ref 4.0–10.5)

## 2010-10-22 ENCOUNTER — Emergency Department (HOSPITAL_COMMUNITY)
Admission: EM | Admit: 2010-10-22 | Discharge: 2010-10-23 | Disposition: A | Discharge status: Home / Self Care | Payer: Medicaid Other | Attending: Emergency Medicine | Admitting: Emergency Medicine

## 2010-10-22 DIAGNOSIS — R1013 Epigastric pain: Secondary | ICD-10-CM | POA: Insufficient documentation

## 2010-10-22 DIAGNOSIS — G8929 Other chronic pain: Secondary | ICD-10-CM | POA: Insufficient documentation

## 2010-10-22 DIAGNOSIS — R112 Nausea with vomiting, unspecified: Secondary | ICD-10-CM | POA: Insufficient documentation

## 2010-10-22 DIAGNOSIS — K861 Other chronic pancreatitis: Secondary | ICD-10-CM | POA: Insufficient documentation

## 2010-10-22 DIAGNOSIS — R1011 Right upper quadrant pain: Secondary | ICD-10-CM | POA: Insufficient documentation

## 2010-10-22 DIAGNOSIS — Z8619 Personal history of other infectious and parasitic diseases: Secondary | ICD-10-CM | POA: Insufficient documentation

## 2010-10-23 LAB — CBC
MCH: 26.9 pg (ref 26.0–34.0)
MCV: 80 fL (ref 78.0–100.0)
Platelets: 296 10*3/uL (ref 150–400)
RDW: 12.1 % (ref 11.5–15.5)
WBC: 8.6 10*3/uL (ref 4.0–10.5)

## 2010-10-23 LAB — COMPREHENSIVE METABOLIC PANEL
Albumin: 3.5 g/dL (ref 3.5–5.2)
BUN: 13 mg/dL (ref 6–23)
Creatinine, Ser: 0.66 mg/dL (ref 0.4–1.2)
GFR calc Af Amer: >60 mL/min (ref >60)
Potassium: 3.7 mEq/L (ref 3.5–5.1)
Total Protein: 7 g/dL (ref 6.0–8.3)

## 2010-10-23 LAB — DIFFERENTIAL
Basophils Relative: 1 % (ref 0–1)
Eosinophils Absolute: 0.3 10*3/uL (ref 0.0–0.7)
Eosinophils Relative: 4 % (ref 0–5)
Lymphs Abs: 3 10*3/uL (ref 0.7–4.0)

## 2010-10-25 ENCOUNTER — Emergency Department (HOSPITAL_COMMUNITY)
Admission: EM | Admit: 2010-10-25 | Discharge: 2010-10-26 | Disposition: A | Discharge status: Home / Self Care | Payer: Medicaid Other | Attending: Emergency Medicine | Admitting: Emergency Medicine

## 2010-10-25 DIAGNOSIS — Z9089 Acquired absence of other organs: Secondary | ICD-10-CM | POA: Insufficient documentation

## 2010-10-25 DIAGNOSIS — Z8619 Personal history of other infectious and parasitic diseases: Secondary | ICD-10-CM | POA: Insufficient documentation

## 2010-10-25 DIAGNOSIS — R109 Unspecified abdominal pain: Secondary | ICD-10-CM | POA: Insufficient documentation

## 2010-10-25 DIAGNOSIS — R10816 Epigastric abdominal tenderness: Secondary | ICD-10-CM | POA: Insufficient documentation

## 2010-10-26 LAB — COMPREHENSIVE METABOLIC PANEL
AST: 21 U/L (ref 0–37)
BUN: 12 mg/dL (ref 6–23)
CO2: 25 mEq/L (ref 19–32)
Chloride: 107 mEq/L (ref 96–112)
Creatinine, Ser: 0.68 mg/dL (ref 0.4–1.2)
GFR calc Af Amer: >60 mL/min (ref >60)
GFR calc non Af Amer: >60 mL/min (ref >60)
Total Bilirubin: 0.4 mg/dL (ref 0.3–1.2)

## 2010-10-26 LAB — CBC
HCT: 38 % (ref 36.0–46.0)
Hemoglobin: 12.7 g/dL (ref 12.0–15.0)
MCH: 26.7 pg (ref 26.0–34.0)
MCV: 80 fL (ref 78.0–100.0)
RBC: 4.75 MIL/uL (ref 3.87–5.11)

## 2010-10-26 LAB — DIFFERENTIAL
Lymphs Abs: 3.2 10*3/uL (ref 0.7–4.0)
Monocytes Relative: 7 % (ref 3–12)
Neutro Abs: 4.7 10*3/uL (ref 1.7–7.7)
Neutrophils Relative %: 52 % (ref 43–77)

## 2010-11-01 ENCOUNTER — Emergency Department (HOSPITAL_COMMUNITY)
Admission: EM | Admit: 2010-11-01 | Discharge: 2010-11-01 | Disposition: A | Discharge status: Home / Self Care | Payer: Medicaid Other | Attending: Emergency Medicine | Admitting: Emergency Medicine

## 2010-11-01 DIAGNOSIS — R109 Unspecified abdominal pain: Secondary | ICD-10-CM | POA: Insufficient documentation

## 2010-11-01 DIAGNOSIS — J45909 Unspecified asthma, uncomplicated: Secondary | ICD-10-CM | POA: Insufficient documentation

## 2010-11-01 DIAGNOSIS — R197 Diarrhea, unspecified: Secondary | ICD-10-CM | POA: Insufficient documentation

## 2010-11-01 DIAGNOSIS — R112 Nausea with vomiting, unspecified: Secondary | ICD-10-CM | POA: Insufficient documentation

## 2010-11-01 LAB — URINALYSIS, ROUTINE W REFLEX MICROSCOPIC
Bilirubin Urine: NEGATIVE
Hgb urine dipstick: NEGATIVE
Ketones, ur: 15 mg/dL — AB
Protein, ur: NEGATIVE mg/dL
Specific Gravity, Urine: 1.028 (ref 1.005–1.030)
Urobilinogen, UA: 1 mg/dL (ref 0.0–1.0)

## 2010-11-01 LAB — DIFFERENTIAL
Basophils Relative: 1 % (ref 0–1)
Eosinophils Absolute: 0.2 10*3/uL (ref 0.0–0.7)
Lymphs Abs: 2.5 10*3/uL (ref 0.7–4.0)
Neutrophils Relative %: 58 % (ref 43–77)

## 2010-11-01 LAB — COMPREHENSIVE METABOLIC PANEL
AST: 17 U/L (ref 0–37)
Albumin: 4.1 g/dL (ref 3.5–5.2)
Alkaline Phosphatase: 47 U/L (ref 39–117)
Chloride: 103 mEq/L (ref 96–112)
GFR calc Af Amer: >60 mL/min (ref >60)
Potassium: 4.2 mEq/L (ref 3.5–5.1)
Total Bilirubin: 0.5 mg/dL (ref 0.3–1.2)

## 2010-11-01 LAB — CBC
MCV: 80.6 fL (ref 78.0–100.0)
Platelets: 260 10*3/uL (ref 150–400)
RBC: 5.01 MIL/uL (ref 3.87–5.11)
WBC: 8.5 10*3/uL (ref 4.0–10.5)

## 2010-11-01 LAB — URINE MICROSCOPIC-ADD ON

## 2010-11-08 ENCOUNTER — Emergency Department (HOSPITAL_COMMUNITY)
Admission: EM | Admit: 2010-11-08 | Discharge: 2010-11-09 | Disposition: A | Discharge status: Home / Self Care | Payer: Medicaid Other | Attending: Emergency Medicine | Admitting: Emergency Medicine

## 2010-11-08 ENCOUNTER — Inpatient Hospital Stay (HOSPITAL_COMMUNITY)
Admission: RE | Admit: 2010-11-08 | Discharge: 2010-11-08 | Disposition: A | Discharge status: Home / Self Care | Payer: Medicaid Other | Source: Ambulatory Visit | Attending: Family Medicine | Admitting: Family Medicine

## 2010-11-08 DIAGNOSIS — M545 Low back pain, unspecified: Secondary | ICD-10-CM

## 2010-11-08 DIAGNOSIS — G8929 Other chronic pain: Secondary | ICD-10-CM | POA: Insufficient documentation

## 2010-11-08 DIAGNOSIS — R197 Diarrhea, unspecified: Secondary | ICD-10-CM

## 2010-11-08 DIAGNOSIS — R109 Unspecified abdominal pain: Secondary | ICD-10-CM | POA: Insufficient documentation

## 2010-11-08 DIAGNOSIS — K861 Other chronic pancreatitis: Secondary | ICD-10-CM | POA: Insufficient documentation

## 2010-11-08 DIAGNOSIS — R112 Nausea with vomiting, unspecified: Secondary | ICD-10-CM

## 2010-11-08 DIAGNOSIS — R1900 Intra-abdominal and pelvic swelling, mass and lump, unspecified site: Secondary | ICD-10-CM | POA: Insufficient documentation

## 2010-11-08 DIAGNOSIS — J45909 Unspecified asthma, uncomplicated: Secondary | ICD-10-CM | POA: Insufficient documentation

## 2010-11-09 LAB — CBC
MCH: 27.2 pg (ref 26.0–34.0)
MCHC: 33.8 g/dL (ref 30.0–36.0)
Platelets: 264 10*3/uL (ref 150–400)

## 2010-11-09 LAB — DIFFERENTIAL
Basophils Relative: 0 % (ref 0–1)
Eosinophils Absolute: 0.5 10*3/uL (ref 0.0–0.7)
Monocytes Absolute: 0.6 10*3/uL (ref 0.1–1.0)
Monocytes Relative: 6 % (ref 3–12)

## 2010-11-09 LAB — COMPREHENSIVE METABOLIC PANEL
Albumin: 3.6 g/dL (ref 3.5–5.2)
BUN: 13 mg/dL (ref 6–23)
Chloride: 104 mEq/L (ref 96–112)
Creatinine, Ser: 0.54 mg/dL (ref 0.4–1.2)
GFR calc non Af Amer: >60 mL/min (ref >60)
Glucose, Bld: 94 mg/dL (ref 70–99)
Total Bilirubin: 0.1 mg/dL — ABNORMAL LOW (ref 0.3–1.2)

## 2010-11-09 LAB — URINALYSIS, ROUTINE W REFLEX MICROSCOPIC
Glucose, UA: NEGATIVE mg/dL
Ketones, ur: NEGATIVE mg/dL
Nitrite: NEGATIVE
Protein, ur: NEGATIVE mg/dL

## 2010-11-17 NOTE — Discharge Summary (Signed)
[name], [name]               ACCOUNT NO.:  [id number]   MEDICAL RECORD NO.:  [id number]          PATIENT TYPE:  INP   LOCATION:  1315                         FACILITY:  [facility]   PHYSICIAN:  Fleet Contras, M.D.    DATE OF BIRTH:  30-Jun-1981   DATE OF ADMISSION:  07/01/2008  DATE OF DISCHARGE:  07/10/2008                               DISCHARGE SUMMARY   HISTORY OF PRESENT ILLNESS:  This is another admission for this 30-year-  old African American lady with past medical history of recurrent acute  pancreatitis and chronic pain syndrome.  She presented this time to the  emergency room with similar symptoms of abdominal pain, nausea and  vomiting, inability to keep her food, drinks and medications down.  Her  pain could not be controlled in the emergency room and was therefore  admitted to the hospital for close monitoring and further evaluation.  On admission, her lipase level was 60 and ALT was 122.  She was started  on intravenous analgesic therapy and intravenous fluids, and kept n.p.o.   HOSPITAL COURSE:  The patient was continued on conservative treatment,  but was slowly responsive with right abdominal pain.  A CT scan of the  abdomen and pelvis was performed and this showed no acute abnormalities,  and specifically no appendicitis.  She had a cholecystectomy done.  Her  sed rate was within normal limits.  She was evaluated by surgery for  possible laparoscopy.  She was noted to have eosinophilia on her blood  work. Stool ova and parasites were obtained, and these were negative.  CT scan also showed some constipation and the patient was treated with  MiraLax with good bowel response.  The patient was not a candidate for  surgical procedure and she was continued on conservative management.  On  July 10, 2008, she was feeling some better, though complained of  persistent pain which was chronic for her.  She was tolerating oral  intake and had a bowel movement.  Her vital signs  were stable.  Her  chest was clear to auscultation.  Abdomen was soft with some tenderness  in the right lower quadrant, but no rebound.  Bowel sounds were present.  Extremities showed no edema.  She was alert, oriented x3 with no focal  neurological deficits.  She was therefore considered stable for  discharge home.   DISCHARGE DIAGNOSES:  1. Constipation.  2. Acute on chronic abdominal pain.  3. 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  4. Asthma.  5. Chronic pancreatitis by history.  6. Chronic pain syndrome.  7. Allergic rhinitis.   CONDITION ON DISCHARGE:  Stable.   DISPOSITION:  Home.   FOLLOW UP:  Followup will be with me in 1 week.   DISCHARGE MEDICATIONS:  1. Dilaudid 4 mg 1 p.o. q.6 h. p.r.n.  2. Astelin nasal spray 2 sprays each nostril b.i.d. p.r.n.  3. Claritin D 12 one p.o. b.i.d. p.r.n.      Fleet Contras, M.D.  Electronically Signed     EA/MEDQ  D:  08/13/2008  T:  08/13/2008  Job:  [id number]

## 2010-11-17 NOTE — Consult Note (Signed)
NAMEHAVANNA, GRONER               ACCOUNT NO.:  [id number]   MEDICAL RECORD NO.:  [id number]          PATIENT TYPE:  INP   LOCATION:  5509                         FACILITY:  [facility]   PHYSICIAN:  Antonietta Breach, M.D.  DATE OF BIRTH:  [birth year]   DATE OF CONSULTATION:  11/15/2008  DATE OF DISCHARGE:                                 CONSULTATION   REFERRING PHYSICIAN:  Fleet Contras, M.D.   REASON FOR CONSULTATION:  Rule out anxiety or some other cause of  somatic symptoms without a clear organic etiology.   HISTORY OF PRESENT ILLNESS:  Ms. Monnica Saltsman is a 30 year old female  admitted to the [facility] on Nov 12, 2008 due to acute on  chronic pancreatitis.  There is concern with the general medical team  that Ms. Poblano has a pain experience more extreme then what would be  expected given her general medical problem.   She does have a history of pancreatitis listed in the past medical  record.  She has been to the emergency room over 12 times since August  2009 for abdominal pain.  Please see the past medical history.   Ms. Egli does have the stress of being a single mom with 2 children  ages 87 and 17.  She states that the fathers of the babies are not helping  at all; however, her mother does help and her mother lives locally.  She  also has a supportive church.  She does not describe any difficulty  taking care of the children during an open-ended conversation.  She  describes fun with the children.   She does have some feeling on edge and muscle tension as well as  insomnia associated with her pain experience.   She expresses her ongoing frustration with her abdominal pain experience  in an open-ended conversation yet is the first complaint that she  mentions in the conversation.   She does not have any thoughts of harming herself or others.  She has no  hallucinations or delusions.  She does have intact interests and  constructive future goals.   PAST  PSYCHIATRIC HISTORY:  In review of the past medical record,  depression is listed in the problem list; however, Ms. Axley denies any  history of any mental conditions or mental treatment.   She does not have any history of suicide attempts, hallucinations or  delusions.   FAMILY PSYCHIATRIC:  None known.   SOCIAL HISTORY:  Please see the history of present illness.  She does  not use any alcohol or illegal drugs.  She is medically disabled and  unemployed.   PAST MEDICAL HISTORY:  In review of the past medical record, Ms. Longstreth  was admitted to the [facility] in April of this year for  what appears to be the most recent admission prior to this one.  Her  lipase at that time in April was greater than 400.   There is a history of eosinophilia that was assessed to be secondary to  rhinitis.  She has a history of asthma.   MEDICATIONS:  The  MAR is reviewed.  She is not on current psychotropic  medication.   ALLERGIES:  IBUPROFEN, CODEINE, PROPOXYPHENE, __________, TRAMADOL,  DEMEROL, MORPHINE SULFATE.   LABORATORY DATA:  Current lipase is normal.  Sodium 140, BUN 5,  creatinine 0.84.  The SGOT is 112, SGPT 204, WBC 6.2, hemoglobin 12.4,  platelet count 252.   The drug screen was positive for opioids only.  Urine pregnancy test was  negative on May 11.   REVIEW OF SYSTEMS:  Constitutional, head, eyes, ears, nose and throat,  mouth, neurologic, psychiatric, cardiovascular, respiratory,  gastrointestinal, genitourinary, skin, musculoskeletal, hematologic,  lymphatic, endocrine, metabolic all unremarkable.   PHYSICAL EXAMINATION:  VITAL SIGNS:  Temperature 98.1, pulse 78,  respiratory rate 15, blood pressure 109/73, O2 saturation on room air  98%.  GENERAL APPEARANCE:  Ms. Riester is a young female partially reclined in a  supine position in her hospital bed with no abnormal involuntary  movements.   MENTAL STATUS EXAM:  Ms. Hinkson is alert.  Her eye contact is good.   She  is socially appropriate.  Her attention span is normal.  Concentration  is normal.  Her affect is slightly anxious at baseline but with a broad  and appropriate range.  Her mood is within normal limits.  Her memory is  intact to immediate, recent and remote.  She is oriented completely to  all spheres.  Fund of knowledge and intelligence within normal limits.  Speech involves normal rate and prosody without dysarthria.  Thought  process is logical, coherent, goal-directed.  No looseness of  associations.  Thought content:  No thoughts of harming herself or  others, no delusions or hallucinations.  Insight is intact.  Judgment is  intact.   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  Ms. Hibner' feeling on edge, muscle tension and periods of insomnia do  appear to be associated with her episodes that have increased pain.  There is no indication for any pharmacotherapy.  It is noted that in an  open-ended conversation, Ms. Dorner does not discuss motherhood  hardships; however, there is no significant or substantial evidence of  excessive repression in the interview.  That does not rule out the  possibility of a condition such as psychologic factors affecting  physical condition, please see the discussion below.  AXIS II:  Deferred.  AXIS III:  See past medical history.  AXIS IV:  General medical.  AXIS V:  55.   If Ms. Dunson does develop a pattern of somatic complaints for which the  organic signs do not explain, she would be a candidate for  psychotherapy.   Also if she was interested, psychotherapy can provide coping skills  and/or stress management.   Regarding treatment of insomnia, Benadryl is on her current MAR and can  be utilized for insomnia as well, 50-100 mg p.o. q.h.s. p.r.n. with  caution regarding dry mouth, other anticholinergic side effects and  excessive sedation.      Antonietta Breach, M.D.  Electronically Signed      JW/MEDQ  D:  11/15/2008  T:  11/15/2008  Job:  [id number]

## 2010-11-17 NOTE — H&P (Signed)
[name], [name]               ACCOUNT NO.:  [id number]   MEDICAL RECORD NO.:  [id number]          PATIENT TYPE:  INP   LOCATION:  1530                         FACILITY:  [facility]   PHYSICIAN:  Fleet Contras, M.D.    DATE OF BIRTH:  1980-12-05   DATE OF ADMISSION:  06/22/2008  DATE OF DISCHARGE:                              HISTORY & PHYSICAL   ADMITTING PHYSICIAN:  Fleet Contras, M.D.   CHIEF COMPLAINT:  Abdominal pain, nausea and vomiting.   HISTORY OF PRESENT ILLNESS:  [name] is a 30 year old African American  lady with past medical history significant for chronic abdominal and  pelvic pain who has been diagnosed with episodes of pancreatitis in the  past.  She is now status post cholecystectomy and total abdominal  hysterectomy but she continues to visit the emergency room very  frequently for chronic pain.  She has been under the care of the pain  clinic in the past but apparently was noncompliant and was discharged.  She has been evaluated by gastroenterology and gynecology and no  significant finding has been found clinically to explain her chronic  pains.  She presents this time with episodes of severe abdominal pain,  epigastric and midabdominal region, nonradiating, associated with nausea  and vomiting and diarrhea, inability to keep her food or drinks down.  She has had no fevers or chills and she has had no chest pain, shortness  of breath, orthopnea or PND.  She has no palpitations, no seizures,  syncope, headaches, dizziness or blurring of vision.  She has no urinary  symptoms.  She has received intravenous Dilaudid in the emergency room  with some relief.  She is unable to go home to continue her medications  at home due to her vomiting.  She is therefore being admitted to the  hospital for IV fluids and pain management until she can tolerate oral  intake   PAST MEDICAL HISTORY:  1. Chronic abdominal pain.  2. History of chronic pancreatitis.  She has been  evaluated by Dr.      [name], gastroenterology, and no long term treatment plan has been      established.  3. Chronic pelvic pain status post total abdominal hysterectomy and      salpingo-oophorectomy by Dr. [name].  She continues to have pain      despite no significant finding.   PAST SURGICAL HISTORY:  As above:  1. Colonoscopy in 2008.  2. Total abdominal hysterectomy and bilateral salpingo-oophorectomy      April 2008.  3. Cholecystectomy October 2008.   MEDICATION HISTORY:  She was on OxyContin 40 mg q.12 hours from the pain  clinic but currently takes Percocet and Vicodin p.r.n. prescribed at the  emergency room.   ALLERGIES:  IBUPROFEN, ULTRAM, DARVOCET, CODEINE.   SOCIAL HISTORY:  She is single and currently lives at boyfriend's.  She  has two children who are in school and healthy.  There is some history  of diabetes and obesity.  She denies any tobacco use, alcohol or illicit  drugs.   REVIEW OF SYSTEMS:  Essentially as  above.   PHYSICAL EXAMINATION:  GENERAL:  She is lying in the hospital bed in  moderate to severe painful distress.  She is not pale.  She is not  icteric.  She is not cyanosed.  She is well-hydrated.  VITAL SIGNS:  Shows a blood pressure of 122/75, heart rate of 100,  respiratory rate 20, temperature 97.9 and O2 sats on room air 96%.  CHEST:  Chest is clear to auscultation.  Heart sounds 1 and 2 are heard.  No murmurs, so S3 gallops.  ABDOMEN:  Abdomen is obese, soft.  Diffuse tenderness with no rebound,  tenderness or guarding.  There are no palpable masses.  Bowel sounds are  present.  Scars are healed.  EXTREMITIES:  Shows no edema, no calf tenderness or swelling.  CNS:  She is alert and oriented x3 with no focal neurological deficits.   LABORATORY DATA:  CT scan of the abdomen and pelvis performed on  June 18, 2008, showed no acute findings.  Urinalysis was negative  for nitrites, has 7-10 WBC per high-powered field and trace leukocyte   esterase.  Pregnancy test is negative.  Sodium is 139, potassium 3.96,  chloride 107, bicarbonate of 27, BUN is 14, creatinine 0.79, glucose is  86, AST is 57, ALT 126, alkaline phosphatase 102, total bilirubin 0.7,  lipase is 164, albumin is 3.9, white count is 9.1, hemoglobin 13.5,  hematocrit 40.4, platelet count of 288.   ASSESSMENT:  This is another admission for this 30 year old [place]  American lady with chronic abdominal pain presenting with abdominal  pain, nausea, vomiting and diarrhea and laboratory data consistent with  mild pancreatitis.  She will be admitted to the hospital for pain  management, control of her nausea and vomiting as well as IV fluids.  She is now status post cholecystectomy and denies any use of alcohol so  the predisposing factor for her pancreatitis is unknown.  She is  afebrile and has no leukocytosis and will therefore be treated  empirically   ADMISSION DIAGNOSES:  1. Acute pancreatitis.  2. Depression.  3. Urinary tract infection.   PLAN OF CARE:  She is admitted to a medical bed.  She was started on IV  fluids, IV Dilaudid 1-2 mg q.4 h and IV Zofran 4 mg q.4 h.  She also  will be started on IV Cipro 200 mg q.12 h.  These will be converted to  oral as soon as she can tolerate p.o. intake.  I's and O's will be  monitored strictly and laboratory data repeated as needed.  Plan of care  has been discussed with her and questions answered.      Fleet Contras, M.D.  Electronically Signed     EA/MEDQ  D:  06/23/2008  T:  06/23/2008  Job:  [id number]

## 2010-11-17 NOTE — Discharge Summary (Signed)
[name], KOEHL               ACCOUNT NO.:  [id number]   MEDICAL RECORD NO.:  [id number]          PATIENT TYPE:  INP   LOCATION:  5018                         FACILITY:  [facility]   PHYSICIAN:  Fleet Contras, M.D.    DATE OF BIRTH:  [birth year]   DATE OF ADMISSION:  07/24/2008  DATE OF DISCHARGE:  07/27/2008                               DISCHARGE SUMMARY   HISTORY OF PRESENT ILLNESS:  Please see the dictated H&P for full  details of presentation.  In summary, [name] is a 30 year old African  American lady with history of chronic abdominal pain syndrome due to  recurrent acute pancreatitis.  She presented at this time with another  episode of abdominal pain, nausea, and vomiting.  She had a boil on her  right thigh which had been present for about a week.  This was lanced at  the emergency room and drained, but her white count was elevated at  13,000 and she was in moderate-to-severe painful distress.  She was,  therefore, admitted to the hospital for intravenous antibiotics.  Transaminases were also elevated and she had a fever up to 101.2.   HOSPITAL COURSE:  On admission, she was started on intravenous Zosyn and  intravenous vancomycin.  Her fever subsided and her white count  normalized.  She is not tolerating full diet with resolution of  abdominal pain and she is considered stable for discharge home.  Vital  signs today shows a blood pressure of 112/77, heart rate of 86 and  regular, respiratory rate of 18, temperature 97.1, and O2 sats on room  air is 99%.  She is not pale.  She is not icteric.  She is not cyanosed.  Chest is clear to auscultation.  Abdomen is benign.  The right groin  wound is still indurated, but no purulent drainage and the packing has  been removed.  Later, laboratory data shows white count of 8.7,  hemoglobin 11.3, hematocrit of 33.8, and platelet count of 272.  Sodium  is 137, potassium 4.2, chloride 104, bicarbonate of 29, BUN is 3,  creatinine 0.75,  glucose is 85, AST is 82, ALT 235, alkaline phosphatase  122, total bilirubin 0.5, and albumin is 3.1.  She is now considered  stable for discharge home today.   DISCHARGE DIAGNOSES:  1. Abscess of the right thigh status post incision and drainage.  2. Acute pancreatitis, clinically improved.  3. Chronic abdominal pain syndrome.   DISCHARGE MEDICATIONS:  1. Dilaudid 4 mg 1 p.o. q.6 p.r.n.  2. Doxycycline 100 mg p.o. b.i.d. for 10 days.  3. Benadryl 25 mg 1 p.o. q.6 p.r.n.   She will follow up with me in the office on Monday, July 29, 2008, at  9 a.m. for dressing change.  This plan of care has been discussed with  her and her questions are answered.      Fleet Contras, M.D.  Electronically Signed     EA/MEDQ  D:  07/27/2008  T:  07/27/2008  Job:  [id number]

## 2010-11-17 NOTE — Discharge Summary (Signed)
NAMESIRINITY, [name]               ACCOUNT NO.:  [id number]   MEDICAL RECORD NO.:  [id number]          PATIENT TYPE:  INP   LOCATION:  6710                         FACILITY:  [facility]   PHYSICIAN:  Santiago Bumpers. Hensel, M.D.DATE OF BIRTH:  11-Sep-1980   DATE OF ADMISSION:  10/18/2007  DATE OF DISCHARGE:  10/22/2007                               DISCHARGE SUMMARY   PRIMARY CARE Jewelle Whitner:  Rosalio Macadamia, nurse practitioner at [facility]   REASON FOR ADMISSION:  Abdominal pain.   DISCHARGE DIAGNOSES:  1. Pancreatitis.  2. Chronic abdominal pain.  3. Transaminitis.  4. Depression.  5. Bacterial vaginosis.   DISCHARGE MEDICATIONS:  1. OxyContin 40 mg p.o. b.i.d.  2. Amitriptyline 25 mg q.a.m. and 50 mg p.o. nightly.  3. MetroGel 1 applicator per vagina times 5 days.  4. Omeprazole 20 mg p.o. daily.  5. Zoloft 50 mg p.o. x1 week and 25 mg p.o. daily.  6. Zofran 8 mg p.o. q.6 h. p.r.n. Nausea.   HOSPITAL COURSE:  1. Abdominal pain:  The patient is frequently admitted with similar      symptoms.  During this admission, she was found to have lipase of      114 as well as elevated transaminases.  She was also Hemoccult      positive.  The patient was placed on a clear liquid diet and given      her usual OxyContin home regimen dose of  40 mg b.i.d. and IV      morphine and 1/2 mg of Dilaudid q.4 h for her abdominal pain.  She      is seen for this and is subsequently started on amitriptyline for      pain and had her Zoloft dose decreased.  Given her frequent      admissions and recurrent pancreatitis as well as elevated liver      enzymes, the patient should have close followup with GI to      determine other workup that may be necessary.  Prior to discharge      her pain was well controlled on oral medications.  2. Bacterial vaginitis:  With prep the patient was found to have clue      cells and she was treated with p.o. Flagyl and then discharged with      MetroGel.  3. Depression:  The patient was initially on a dose of 100 mg of      Zoloft daily.  At discharge was started on amitriptyline.  Her      Zoloft was decreased to 50 mg p.o. daily to be further decreased 25      mg p.o. daily in 1 week after discharge.   CONDITION AT TIME OF DISCHARGE:  Improved.   DISCHARGE LABS:  INR 1.2, lipase 29, sodium 41, potassium 3.4, BUN less  than 1, creatinine 0.9, total bilirubin 0.3, alkaline phosphatase 105,  AST 126, ALT 185, total protein 6.4, albumin 3.3, potassium 8.9,  hemoglobin 12.2, platelets 270, tissue transglutaminase 0.5, C. diff  negative, GC and chlamydia both negative, fecal occult blood positive.  DISPOSITION:  The patient is discharged to home.   DISCHARGE FOLLOWUP:  The patient is to call and schedule hospital follow-  up appointment with Rosalio Macadamia in the [facility] this  week.   FOLLOWUP ISSUES:  1. Repeated episodes of pancreatitis.  2. Elevated transaminases at discharge.  3. Adjustments to the patient's Zoloft dose and the start of      amitriptyline for chronic pain.      Lauro Franklin, MD  Electronically Signed      Santiago Bumpers. Leveda Anna, M.D.  Electronically Signed    TCB/MEDQ  D:  10/23/2007  T:  10/24/2007  Job:  [id number]

## 2010-11-17 NOTE — Discharge Summary (Signed)
[name], [name]               ACCOUNT NO.:  [id number]   MEDICAL RECORD NO.:  [id number]          PATIENT TYPE:  INP   LOCATION:  1530                         FACILITY:  [facility]   PHYSICIAN:  Fleet Contras, M.D.    DATE OF BIRTH:  02/28/1981   DATE OF ADMISSION:  06/22/2008  DATE OF DISCHARGE:  06/26/2008                               DISCHARGE SUMMARY   HISTORY OF PRESENT ILLNESS:  Please see the dictated history and  physical report for full details of presentation.  In summary [name]  is a 30 year old African American lady with past medical history  significant for chronic abdominal and pelvic pain previously attributed  to episodes of acute on chronic pancreatitis.  She had had extensive  workup in the past by gastroenterology as well as gynecology resulting  in hysterectomy and bilateral salpingo-oophorectomy without significant  findings or relief of her symptoms.  She has been under the care of  multiple pain clinics in the past but currently not.  She presented to  the emergency room this time with similar complaints of severe pain,  mainly on the right side associated with nausea and vomiting and  diarrhea, inability to keep her food and drinks down.  She has had no  fevers or chills and she received intravenous Dilaudid in the emergency  room with some relief but was not significant enough for her to be  discharged home because she could not keep her medications down due to  vomiting.  She was admitted to the hospital for intravenous fluid,  intravenous analgesia and further evaluation.   HOSPITAL COURSE:  On admission she was in moderate to severe painful  distress.  Her vital signs were stable.  She had tenderness in the  abdomen diffusely, mainly on the right side.  There were no palpable  masses.  Bowel sounds were present.  Laboratory data showed sodium of 139, potassium 3.9, chloride 107, BUN  14, creatinine 0.79, bicarbonate of 27.  Her AST and ALT were  elevated  at 245 and 458 respectively.  Lipase was 160.  She was therefore thought  to have acute pancreatitis, although her CT scan of the abdomen and  pelvis was negative.  Urinalysis also showed positive leukocyte esterase  of 7-10 WBC per high-powered field.  She was therefore started on  intravenous Dilaudid 1-2 mg q.4 h, intravenous Zofran 4 mg q.4 h and  intravenous Cipro 4 mg q.12.  She was kept n.p.o. and then gradually  advanced to clear liquids and then to a regular diet.  She has had no  further episodes of vomiting but still feels some nausea.  Her pain had  persisted despite these medications.  She was also on intravenous  Toradol 10 mg q.6 h for 48 hours.  This was changed to oral diclofenac  75 mg b.i.d.  Liver function tests and lipase levels were monitored and  did decrease slowly.  Acute hepatitis panel was negative for A, B and C.  She reported to me that she has had a history of herniated lumbar disk  in the past and  was advised to have surgery but she refused and due to  the position of her current pain on the right side that seems to be  radiating from the lumbar spine it is possible that this may be  radiating pain from the lumbar spine.  Today she is feeling some better.  She is resting in bed not in acute respiratory or painful distress.  She  is tolerating a full diet.  Her vital signs are stable.  She is  afebrile.  She still has some tenderness on the right side of the  abdomen all the way down to the upper lumbar region.  There are no  visible rashes, swelling or deformities.   DATA:  Laboratory data today showed AST of 63, ALT of 222 and lipase of  42.  A repeat CT scan was performed on June 24, 2008, and again  showed no evidence of acute appendicitis, kidney stones, pancreatitis or  other abdominal or pelvic abnormalities.  She is now being considered  stable for discharge home.  She will be followed up in the office in  about a week.   DISCHARGE  DIAGNOSES:  1. Recurrent abdominal pain mainly on the right side.  2. Acute on chronic pancreatitis not confirmed by CT scan.  3. Abnormal liver function tests status post remote history of      cholecystectomy.  4. Urinary tract infection tolerating oral ciprofloxacin.   PLAN OF CARE:  I will add muscle relaxants and Lyrica to her treatment  plan.  She will be discharged home to be followed up in my office in 1  week.  Her discharge medications will be:   DISCHARGE MEDICATIONS:  1. Ciprofloxacin 500 mg p.o. b.i.d. for 5 more days.  2. Dilaudid 4 mg one p.o. b.i.d. p.r.n.  3. Robaxin 750 mg p.o. q.i.d. p.r.n.  4. Diclofenac 75 mg p.o. b.i.d. p.r.n.  5. Lyrica 50 mg one p.o. b.i.d.   This plan of care has been discussed with her and her questions  answered.      Fleet Contras, M.D.  Electronically Signed     EA/MEDQ  D:  06/26/2008  T:  06/26/2008  Job:  [id number]

## 2010-11-17 NOTE — H&P (Signed)
[name], [name]               ACCOUNT NO.:  [id number]   MEDICAL RECORD NO.:  [id number]          PATIENT TYPE:  INP   LOCATION:                               FACILITY:  [facility]   PHYSICIAN:  Fleet Contras, M.D.    DATE OF BIRTH:  Aug 16, 1980   DATE OF ADMISSION:  07/25/2008  DATE OF DISCHARGE:                              HISTORY & PHYSICAL   HISTORY OF PRESENTING COMPLAINT:  Right thigh boil, abdominal pain,  nausea and vomiting.  This is another admission for this 30 year old  [place] American lady with past medical history significant for chronic  abdominal pain syndrome and recurrent acute pancreatitis.  She was  recently admitted about a month ago at [facility]  She  presents at this time to the emergency room at [facility] with  few days history of boil on the right thigh which started like a bump  that progressively got more painful and more swollen and started  discharging pus.  She also has a flare-up of chronic abdominal pain  which is central in nature, nonradiating, associated with nausea and  vomiting, and unable to keep her food and drinks down.  She was  evaluated at the emergency room, and she was in moderate-to-severe  painful distress.  She was not pale.  She was not icteric.  She was not  cyanosed.  She was well hydrated and vital signs were stable and.  Data  showed elevated white count of 13,000, and her liver function tests and  lipase were mildly elevated in view of her persistent vomiting and  unable to keep food and drinks down.  It was thought it would be wise  for her to be admitted to the hospital for intravenous antibiotics for  the abscess on the right thigh.   PAST MEDICAL HISTORY:  1. Chronic abdominal pain syndrome,  2. Recurrent acute pancreatitis status post cholecystectomy.  3. Depression.   MEDICATION HISTORY:  Please see med rec sheet for details.   ALLERGIES:  She has allergies to MORPHINE, ULTRAM, and CODEINE.   FAMILY AND SOCIAL HISTORY:  She lives with her family.  She is single.  She denies any use of alcohol, tobacco, or illicit drugs.   REVIEW OF SYSTEMS:  She said she had some fevers and chills.  She denied  any headaches, dizziness, blurring of vision, weakness in extremities,  or seizures.  She has no urinary symptoms.   PHYSICAL EXAMINATION:  She is lying in the hospital bed, not in acute  respiratory or painful distress.  She is not pale.  She is not icteric.  She is not cyanosed.  She is well hydrated.  Her initial vital signs  shows a blood pressure of 93/59, heart rate of 79 and regular,  respiratory rate of 16, temperature 97.7, and O2 sats on room air is  98%.  Chest shows good air entry bilaterally with no rales, rhonchi, or  wheezes.  Heart sounds were not heard.  No murmurs, no S3 gallops.  Abdomen is obese, soft.  There is some tenderness in the mid abdomen  without any guarding or rebound.  No palpable masses.  Bowel sounds are  present.  Extremities show no edema, calf tenderness, or swelling.   LABORATORY DATA:  INR is 1.2.  Urinalysis is positive for trace  leukocyte esterase, many bacteria, and 3-6 WBC per high-power field.  Ultrasound scan of the right thigh showed a 9 x 10 subcutaneous abscess  in the right thigh region.  CMP shows sodium of 140, potassium 3.5,  chloride 107, bicarbonate of 27, BUN of 10, creatinine 0.75, glucose of  140, AST is 168, ALT 246, alkaline phosphatase 121.  Total bilirubin  0.9, albumin is 3.7, lipase 63.  White count is 13.3, hemoglobin 12.4,  hematocrit 37, platelet count of 293.   ASSESSMENT:  This is another admission for this 30 year old African  American lady with chronic abdominal pain syndrome, presenting with  symptoms of abdominal pain, nausea and vomiting, and boil  on the right  thigh.  Her laboratory data is consistent with infection and mild  bronchitis.  She was admitted to the hospital for intravenous  antibiotics,  intravenous fluids, and intravenous pain medication.   ADMISSION DIAGNOSES:  1. Mild acute pancreatitis.  2. Abscess of the right thigh.   PLAN OF CARE:  She will be admitted to a medical bed.  She will be on  clear liquids, IV Zosyn 3/0.375 g q.8 h., IV vancomycin per pharmacy  dosing for possible MRSA, IV Dilaudid 1-2 mg q.4 h., IV Zofran 4 mg q.4  h.  Her blood count to be repeated in the morning and medication  adjusted according to her response.  This plan of care has been  discussed with her and her questions answered.      Fleet Contras, M.D.  Electronically Signed     EA/MEDQ  D:  07/25/2008  T:  07/26/2008  Job:  [id number]

## 2010-11-17 NOTE — Discharge Summary (Signed)
NAMEJONELLA, [name]               ACCOUNT NO.:  [id number]   MEDICAL RECORD NO.:  [id number]          PATIENT TYPE:  INP   LOCATION:  5153                         FACILITY:  [facility]   PHYSICIAN:  Leighton Roach McDiarmid, M.D.DATE OF BIRTH:  1981-05-08   DATE OF ADMISSION:  10/07/2007  DATE OF DISCHARGE:  10/11/2007                               DISCHARGE SUMMARY   PRIMARY CARE Jayvon Mounger:  Luretha Murphy, nurse practitioner of [facility].   CONSULTANT:  Jordan Hawks. Elnoria Howard, MD.   REASON FOR ADMISSION:  1. Abdominal pain.  2. Nausea.  3. Vomiting.  4. Diarrhea.  5. Report of possible migraine headache.  6. Transaminitis.   DISCHARGE DIAGNOSES:  1. Colitis of unknown etiology, resolved.  2. Chronic abdominal pain.  3. Depression.  4. Allergic rhinitis.  5. Asthma.  6. Report of possible migraine headache.  7. Transaminitis.   DISCHARGE MEDICATIONS:  1. Zoloft 100 mg p.o. daily.  2. Ambien 5 mg p.o. nightly p.r.n. insomnia.  3. OxyContin 40 mg p.o. b.i.d.  4. Zofran 8 mg p.o. q.8 h. p.r.n. nausea.   Please note that patient was able to tolerate morphine while  hospitalized and therefore does not have an allergy to morphine.  It  should be noted secondary to the patient's reported allergies to  multiple pain medications and her frequent request for Dilaudid for pain  control.   HOSPITAL COURSE:  1. Abdominal pain/nausea/vomiting/diarrhea:  Upon evaluation in the      emergency department, she was complaining of the aforementioned      symptoms.  She had been recently admitted and discharged,      pancreatitis.  She received Dilaudid in the emergency department      and reported minimal relief of pain.  However, prior to admission      to the floor, she initially requested [place] to leave against medical      advise.  She changed her mind and underwent admission.  The patient      had an abdominal CT done, which showed evidence of colitis was      likely inflammatory  versus infectious etiology.  She was placed on      IV morphine for pain control.  She also received Zofran as needed      for nausea.  She was continued on her home regimen of OxyContin.      Dr. [name] and Dr. Elnoria Howard of gastroenterology was consulted given that      the patient was supposedly to undergo MRCP on October 11, 2007.  Given      her CT findings, the patient was tracked for colonoscopy and      underwent colonoscopy which was significant only for a sessile      polyp of the rectum and did not have any evidence of colitis.  No      immediate followup with GI will be necessary per Dr. Elnoria Howard, and she      is to have a repeat colonoscopy in 3 to 10 years depending on the      pathology results  and the biopsy is taken.  Of note, the patient      had multiple admissions to the hospital for similar complaints.      There is some concern for questionable behavior with regard to pain      medication request.  The patient did report some nausea with      morphine, however, she does not have an allergy to it.  She will      discuss on the issue in the future.  The patient is to eat a high-      fiber diet on discharge per GI and to follow with Luretha Murphy at      [facility] after discharge.  2. Depression:  Given, what continues to be a complex psychosocial      situation, the patient's Zoloft was increased from 50 mg to a 100      mg.  She may be a good candidate for outpatient counseling and/or      psychiatry if she has not already had these services.  3. Reported migraine headache:  The patient reports that she was      recently diagnosed with migraine and prescribed Dilaudid for pain      control.  She denies having been tried on any other medications      prior to that.  If the patient's headache persists, we recommend      evaluation at a headache clinic.  4. Transaminitis:  On admission, the patient was found to have normal      and isolated elevated ALTs.  Subsequent  complete metabolic panel      showed elevated AST and ALT.  An acute hepatitis panel was ordered      and was negative.  Transaminases continued to trend up on      subsequent day of hospitalization and peaked with an AST of 135 and      ALT of 121.  This may have been secondary to underlying abdominal      process.  The patient is status post cholecystectomy and did not      have any evidence, but she did have mild common bile duct      dilatation noted on CT.  By the day of discharge, her transaminases      had trended back down but only a mild elevation of ALT remained.      She will continue to be monitored as an outpatient.   CONDITION AT THE TIME OF DISCHARGE:  Improved.   DISCHARGE LABS:  Lipase 27, BUN 1, creatinine 0.97, total bili 0.4, alk  phos 94, AST 33, ALT 66, albumin 3.4, and hemoglobin 12.9.   DISPOSITION:  The patient is discharged to home.   DISCHARGE FOLLOWUP:  The patient is to follow up with Luretha Murphy at  [facility] on October 16, 2007 at 08:30 a.m.   FOLLOWUP ISSUES:  1. The patient is to eat a high-fiber diet.  2. Concerning behavior with regard to pain medication request.  3. We will need to followup on the pathology from patient's      colonoscopy.  4. The patient did have transaminitis and negative acute hepatitis      panel while in the hospital, and should have these levels monitored      as an outpatient.      Lauro Franklin, MD  Electronically Signed      Leighton Roach McDiarmid, M.D.  Electronically Signed  TCB/MEDQ  D:  10/14/2007  T:  10/15/2007  Job:  [id number]

## 2010-11-17 NOTE — Op Note (Signed)
NAMESENNA, [name]               ACCOUNT NO.:  [id number]   MEDICAL RECORD NO.:  [id number]          PATIENT TYPE:  AMB   LOCATION:  DAY                          FACILITY:  [facility]   PHYSICIAN:  Ollen Gross. Vernell Morgans, M.D. DATE OF BIRTH:  1981-02-13   DATE OF PROCEDURE:  02/10/2009  DATE OF DISCHARGE:                               OPERATIVE REPORT   PREOPERATIVE DIAGNOSIS:  Chronic right-sided abdominal pain.   POSTOPERATIVE DIAGNOSIS:  Chronic right-sided abdominal pain.   PROCEDURE:  Diagnostic laparoscopy and lysis of adhesions.   SURGEON:  Ollen Gross. Vernell Morgans, M.D.   ANESTHESIA:  General endotracheal.   PROCEDURE:  After informed consent was obtained, the patient was brought  to the operating room and placed in the supine position on the operating  room table.  After induction of general anesthesia the patient's abdomen  was prepped with a ChloraPrep and then allowed to dry and then draped in  the usual sterile manner.  The area below the umbilicus was infiltrated  with 0.25% Marcaine.  A small incision was made a 15 blade knife.  This  incision was carried down through the subcutaneous tissue bluntly with a  hemostat and [employer] retractors until the linea alba was identified.  The linea alba was incised with a 15 blade knife and each side was  grasped with Kocher clamps and elevated anteriorly.  The preperitoneal  space was then probed bluntly with a hemostat until the peritoneum was  opened and access was gained to the abdominal cavity.  A Zero Vicryl  pursestring stitch was then placed in the fascia around the opening.  The Hasson cannula was placed through the opening and anchored in place  with the previously placed Vicryl pursestring stitch.  The abdomen was  then insufflated with carbon dioxide without difficulty.  The patient  was placed in Trendelenburg position, rotated with the right side up.  A  laparoscope was inserted through the Hasson cannula.  The was abdomen  generally inspected.  There were no adhesions and nothing obvious to  cause her pain.  Two sites were chosen.  One above and one below the  Hasson cannula for placement 5 mm ports and two of  these areas  infiltrated with 0.25% Marcaine.  Small stab incisions were made with a  15 blade knife and the 5 mm ports were placed bluntly through these  incisions into the abdominal cavity under direct vision.  The right  lower quadrant was then examined. The right colon was tethered down in  the pelvis because of some adhesions to her old area of hysterectomy.  These adhesions were taken down sharply with a laparoscopic scissors and  this allowed the right colon to be free and mobile and come up out of  the pelvis.  No other abnormalities were noted.  The small bowel was run  and there was no evidence of Meckel's diverticulum.  The liver looked  good.  There were no obvious complications from the liver from the  previous cholecystectomy and the gallbladder bed looked normal.  At this  point, the abdomen  was irrigated with some saline and at this point, the  ports were removed and the gas was allowed to escape.  The fascial  defect was closed with the previously placed Vicryl pursestring stitch,  as well as with another figure-of-eight zero Vicryl stitch.  The skin  incisions were all closed with interrupted 4-0  Monocryl subcuticular stitches and Dermabond dressings were applied.  The patient tolerated the procedure well.  At the end of the case, all  needle, sponge and instrument counts were correct.  The patient was then  awakened and taken to the recovery room in stable condition.      Ollen Gross. Vernell Morgans, M.D.  Electronically Signed     PST/MEDQ  D:  02/10/2009  T:  02/10/2009  Job:  [id number]

## 2010-11-17 NOTE — H&P (Signed)
NAMEKRYSTLE, Lee               ACCOUNT NO.:  [id number]   MEDICAL RECORD NO.:  [id number]          PATIENT TYPE:  INP   LOCATION:  6710                         FACILITY:  [facility]   PHYSICIAN:  Ancil Boozer, MD      DATE OF BIRTH:  Apr 16, 1981   DATE OF ADMISSION:  10/18/2007  DATE OF DISCHARGE:  10/22/2007                              HISTORY & PHYSICAL   PRIMARY CARE PHYSICIAN:  Hal Hope, family nurse practitioner.   CHIEF COMPLAINT:  1. Pancreatitis.  2. Abdominal pain.   HISTORY OF PRESENT ILLNESS:  [name] is a 30 year old with frequent  emergency department visits who presents with what she describes as her  usual pancreatitis pain but with an additional right lower quadrant  pain.  The pain was somewhat better after leaving the hospital but  approximately 4-5 days ago, she began having epigastric and upper  quadrant pain, which she describes as her usual pancreatitis pain.  She  subsequently developed the  right lower quadrant pain starting  yesterday.  She also started having vomiting today and bright red blood  per rectum which was new.  Her pain medicine has not been helping much  at home nor has her nausea medicine.  She denies any other changes since  her last hospital admission.  She cannot think of a particular reason  that this has occurred this time nor can she can think of anything that  makes it better or worse.   CURRENT ALLERGIES:  IBUPROFEN, ULTRAM, DARVOCET, and CODEINE.   PAST MEDICAL HISTORY:  1. 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  Frequent visits to the emergency room for pelvic pain for      which Dr. [name] initially did a total abdominal hysterectomy and      bilateral salpingo-oophorectomy.  2. Chronic pancreatitis, followed by [facility].  She receives      60 of OxyContin 40 mg p.o. monthly.  3. Colonoscopy in March 2009, which revealed a small polyp and colitis      and the polyp came back  benign.  4. CT of the abdomen and pelvis in March 2009, which showed findings      of colitis that was not confirmed by colonoscopy.   PAST SURGICAL HISTORY:  1. Abdomen and pelvic ultrasound and CT were normal in November 2007.  2. Colposcopy per CIN 1 was performed in Nov 21, 2003.  3. LV ultrasound was unremarkable on March 05, 2002, LV ultrasound      was unremarkable in August 02, 2002, and LV ultrasound was      unremarkable in May 05, 2002.  4. TAH and BSO October 04, 2006.  5. Cholecystectomy in October 2008.   FAMILY HISTORY:  Diabetes and obesity in her mother and grandmother.  There are no cancers in her family.   SOCIAL HISTORY:  She quit high school in 10th grade.  She has a son in  the first grade and a daughter in Pre-K and her living situation changes  frequently.  She has to support  her boyfriend.  She is in poverty and in  lineage of poor health, depression, and lack of insight.   REVIEW OF SYSTEMS:  Please see HPI, but in general she does complain of  chills.  She also complains of abdominal pain, bloody stools, nausea,  and vomiting, but otherwise her review of systems is negative and  greater than 10 points.   PHYSICAL EXAMINATION:  VITALS:  O2 saturation of 98%, temperature of 98  degrees Fahrenheit, pulse rate 86, respiratory rate 20, and blood  pressure 112/73.  GENERAL:  Well developed, well nourished in no acute distress.  Alert,  appropriate, and cooperative throughout the examination.  HEENT:  Normocephalic and atraumatic without obvious abnormalities.  Sclerae are anicteric.  Ears reveal abnormal external exam.  Nares are  clear without lesions, exudates, or rhinorrhea.  Mouth reveals tacky  mucous membranes, fair dentition, and oropharynx is pink.  Nonerythematous.  NECK:  No deformities, masses or tenderness.  LUNGS:  Normal respiratory effort and clear to auscultation bilaterally  without wheezes, rales or rhonchi.  HEART:  Normal rate and  rhythm with no murmur, rubs, or gallops.  ABDOMEN:  Soft.  There are normal bowel sounds. No distension and no  masses.  There is voluntary guarding but no rebound.  There is no  hepatosplenomegaly appreciated.  Pain is strongly in the right upper  quadrant and right lower quadrant.  There are multiple incision sites  from her multiple abdominal surgeries.  RECTAL AND GENITALIA:  Reported to be normal by the ED physician.  MUSCULOSKELETAL :  No CVA tenderness bilaterally.  She is tender,  however, over the lower spine to palpation.  PULSES:  We were unable to appreciate dorsalis pedis pulse but she has a  capillary refill of 2 seconds.  EXTREMITIES:  No cyanosis, clubbing, or edema.  NEUROLOGIC:  No cranial nerve deficits.  SKIN:  No rashes.   MEDICATIONS:  1. Zoloft 100 mg p.o.daliy.  2. Ambien 10 mg p.o. nightly.  3. OxyContin 40 mg p.o. b.i.d.  4. Zofran 8 mg p.o. p.r.n.  5. Omeprazole p.o. every morning.   LABORATORY DATA:  Comprehensive metabolic panel reveals sodium 956,  potassium 4.2, chloride 106, white count 25, BUN 12, creatinine 0.76,  glucose 78, total bilirubin 0.6, alkaline phosphatase 101, ALT 72, AST  44, calcium 9.0, albumin 4.0, total protein 7.2, lipase 112.  Wet prep  reveals few white blood cells and  few clue cells.  GC and chlamydia are  pending.  She was Heme occult positive.  CBC reveals white blood cell  count 9.6, hemoglobin 13.3, hematocrit 38.3, platelet count 319 with a  normal differential.  Urinalysis reveals specific gravity 1.028, pH 5.5,  small leuk esterase, 3-6 white blood cells, rare epithelial cell, and no  bacteria.   ASSESSMENT AND PLAN:  Ms. [name] is a 30 year old with abdominal pain and  laboratory values consistent with pancreatitis.  1. Abdominal pain.  We will maintain and continue her home medication      regimen of Zofran.  We will provide IV for nausea and OxyContin 40      mg by mouth 2 times daily for pain relief. We will add  an      additional morphine 2 to 4 mg q.2 h. IV as needed for pain with      Benadryl 25 mg by mouth q.4 h. as needed for associated itching.      We will add Protonix to see if  this helps as well. We will monitor      her labs closely but at this point she is afebrile and without a      white count.  For this reason, we will not do a CT of the abdomen      particularly, as she just had one in October 08, 2007, and does not      show signs of acute abdomen at this point nor abnormalities in her      laboratory values or signs of infection.  We will check C.      difficile besides ova and parasites and stool culture given her new      bloody diarrhea but she did just recently have a colonoscopy which      was essentially normal.  We will start with a clear diet as      tolerated and give 1 L normal saline bolus for slight dehydration      followed by D5 half-normal saline with 28 K at 150 mL an hour given      her acute pancreatitis. We will follow up on her GC and chlamydia      culture to make sure no PID is present as well and needs to be      treated. We will avoid Dilaudid for pain as the patient has a pain-      seeking behavior.  2. Depression:  We will continue Zoloft 100 mg by mouth once daily.  3. Insomnia:  We will continue Ambien 5 mg by mouth at bedtime as      needed.  4. Bacterial vaginosis:  We will treat with Flagyl 500 mg by mouth      b.i.d. for 7 days.  5. Fluids, electrolytes, nutrition:  Gastrointestinal as above.  6. Prophylaxis:  Given bright red blood per rectum, we will not use      Lovenox or heparin.  We will instead have the patient ambulate in      his SCD's and even while in bed.  Protonix as noted above.   DISPOSITION:  Is obtaining pain control medications.  Hopefully, she  will be home very soon.      Ancil Boozer, MD  Electronically Signed     SA/MEDQ  D:  10/22/2007  T:  10/23/2007  Job:  [id number]

## 2010-11-17 NOTE — H&P (Signed)
[name], [name]               ACCOUNT NO.:  [id number]   MEDICAL RECORD NO.:  [id number]          PATIENT TYPE:  INP   LOCATION:  5153                         FACILITY:  [facility]   PHYSICIAN:  Pearlean Brownie, M.D.DATE OF BIRTH:  Mar 04, 1981   DATE OF ADMISSION:  10/07/2007  DATE OF DISCHARGE:  10/11/2007                              HISTORY & PHYSICAL   PRIMARY CARE PHYSICIAN:  Luretha Murphy, N.P. of [facility].   CHIEF COMPLAINT:  Abdominal pain, nausea, vomiting, and diarrhea.   HISTORY OF PRESENT ILLNESS:  The patient is a 30 year old female with a  history of pancreatitis recently being discharged from the hospital  complaining of severe abdominal pain, vomiting, and diarrhea since  Wednesday.  The patient states this has gotten progressively worst.  She  reports 4 episodes of yellowish emesis a day, approximately 10  brownish/reddish/black diarrheal stools.  The patient states the pain is  mostly epigastric in the right upper quadrant and goes to her back  around her left flank.  The patient reports to me that she is unable to  tolerate anything by mouth including foods, meds, and liquids.  Denies  relief at home with OxyContin and Zofran, here receive 5 mg of Dilaudid  in the ED with minimal relief for her.  The patient is followed by Dr.  [name] of GI and per patient, they plans for what sounds like an MRCP on  October 11, 2007.   PAST MEDICAL HISTORY:  1. Pancreatitis.  2. Status post cholecystectomy.  3. Status post total abdominal hysterectomy and bilateral salpingo-      oophorectomy.  4. Allergic rhinitis.  5. Depression.  6. Asthma.  7. Chronic abdominal pain with weekly emergency room visit for      evaluation.   FAMILY HISTORY:  Mother with diabetes mellitus type 2 and obesity, and  grandmother with diabetes mellitus type 2 and obesity.   ALLERGIES:  1. IBUPROFEN.  2. CODEINE.  3. DARVOCET.  4. ULTRAM.   SOCIAL HISTORY:  The patient  reports tobacco abuse.  She lives with her  2 children and with her boyfriend.   REVIEW OF SYSTEMS:  Positive for chills, abdominal pain, diarrhea,  nausea, and vomiting.  Negative for discharge and dysuria.   PHYSICAL EXAMINATION:  VITAL SIGNS:  Temperature 98.9, O2 sat 100% on  room air, heart rate 84, respiratory rate 18, blood pressure 114/77.  GENERAL:  Well-developed, well-nourished female in no acute distress.  HEENT:  Moist mucous membranes without lesions or exudates.  No cervical  lymphadenopathy.  No scleral icterus.  CARDIOVASCULAR:  Regular rate and rhythm with normal S1 and S2.  No  murmurs, rubs, or gallops appreciated.  LUNGS:  Clear to auscultation bilaterally with no wheezes, rales, or  rhonchi.  Normal work of breathing.  ABDOMEN:  Soft with multiple scars from previous surgery to the striae.  Diffuse tenderness to palpation, perhaps worst in right upper quadrant.  Positive bowel sounds.  RECTAL:  No significant abnormalities noted.  Normal sphincter tone.  No  rectal masses or tenderness.  Hemoccult negative.  MUSCULOSKELETAL:  Normal full range of motion in bilateral upper and  lower extremities.  No edema.   LABORATORY DATA:  Lipase 29, sodium 139, potassium 3.7, chloride 107,  bicarb 26, glucose 97, BUN 11, creatinine 0.81, total bilirubin 0.6, alk  phos 98, AST 28, ALT 49, total protein 7.6, albumin 1.4, and calcium  9.5.  White blood cell count 12.1, hemoglobin 13.6, hematocrit 40.6, and  platelets 354.  Urinalysis with 15 ketones, 100 proteins, and negative  for nitrite and leukocyte.   ASSESSMENT AND PLAN:  1. Abdominal pain:  The patient with a sensitive history of abdominal      pain.  Currently, with normal lipase and mildly elevated ALT.  We      will admit for pain control and discuss with primary      gastroenterologist.  We will use morphine 4 mg IV q.2 as needed, in      addition OxyContin 40 mg p.o. b.i.d.  Zofran for nausea.  We may be       able to discharge in a.m. if pain control improved since the      patient has a long-standing history of abdominal pain.      Gastroenterology follow up currently scheduled for October 11, 2007.  2. Depression.  We will continue the patient's home dose of Zoloft.  3. Vomiting.  We will place the patient on maintenance IV fluids and      Zofran as needed.  We will start her on clear liquid diet, as      tolerated.  4. Prophylaxis:  Ambulation SCDs.  5. Disposition.  Pending and improve pain control and able to tolerate      both food and medications orally.      Lauro Franklin, MD  Electronically Signed      Pearlean Brownie, M.D.  Electronically Signed    TCB/MEDQ  D:  10/11/2007  T:  10/12/2007  Job:  [id number]

## 2010-11-17 NOTE — Group Therapy Note (Signed)
[name], [name]               ACCOUNT NO.:  [id number]   MEDICAL RECORD NO.:  [id number]          PATIENT TYPE:  WOC   LOCATION:  [facility]                   FACILITY:  [facility]   PHYSICIAN:  Argentina Donovan, MD        DATE OF BIRTH:  1980-09-13   DATE OF SERVICE:  01/01/2009                                  CLINIC NOTE   The patient is a 30 year old African American female gravida 2, para 2-0-  0-2 who in 2008 had a total abdominal hysterectomy and a bilateral  salpingo-oophorectomy for chronic pelvic pain with adenomyosis  leiomyomata, pelvic adhesions and cysts.  She seems to have been well  until a couple of months ago when she started developing abdominal pain.  She recently had her gallbladder removed.  Since then she has been in to  [facility] and [facility].  In [place] it was an admission for  pancreatitis.  She said they said her enzymes were way up and when she  went home from Saturday to a Thursday they told her that her enzymes  were back to normal.  She continues to have pain in the right upper  quadrant radiating down into the right lower quadrant.  The pain is  there all the time, but it is especially bad immediately after she eats  anything.  She gets severe pain.  She said she can hardly eat anything  because of that.  She was referred here after being seen in the  emergency room at [facility] and I am not sure why she was sent here  because she has no pelvic organs anymore.  However, she has not been  taking her estrogen regularly.  I have convinced her that that is  terribly important along with calcium and vitamin D.   As long as she is here I have examined her.  Her abdomen is soft, flat,  very tender in the right lower quadrant without guarding or rebound and  along the right flank.  She said that when she eats the pain appears in  the mid epigastrium.  It radiates over to the right side and down into  the right lower quadrant.  I am going to get a screen  for Helicobacter  pylori.  I am going to get her a referral to a gastroenterologist.  The  patient has allergies to ibuprofen and morphine and tramadol.  I am not  exactly sure what is wrong with this patient.  She has no sign of reflux  when I talk to her.  I think she does need Gastroenterology, endoscopy  and evaluation.   IMPRESSION:  1. Epigastric pain, postprandial pain increase.  2. Surgical menopause.  Dr. Argentina Donovan.           ______________________________  Argentina Donovan, MD     PR/MEDQ  D:  01/01/2009  T:  01/01/2009  Job:  [id number]

## 2010-11-17 NOTE — H&P (Signed)
NAMEWILLOWDEAN, [name]               ACCOUNT NO.:  [id number]   MEDICAL RECORD NO.:  [id number]          PATIENT TYPE:  WOC   LOCATION:  WOC                          FACILITY:  [facility]   PHYSICIAN:  Lupita Raider, M.D.   DATE OF BIRTH:  1980-08-27   DATE OF ADMISSION:  DATE OF DISCHARGE:                              HISTORY & PHYSICAL   ATTENDING PHYSICIAN:  Zachery Dauer, M.D.   PRIMARY CARE PHYSICIAN:  Luretha Murphy, Nurse Practitioner [facility]   CHIEF COMPLAINT:  Abdominal pain.   HISTORY OF PRESENT ILLNESS:  A 30 year old African-American female with  chronic pain issues who is due for her cholecystectomy on April 11, 2007 by Dr. Lindie Spruce for the patient, also with a history acute  pancreatitis in the past, seen by Dr. [name] who presents with worsening  mid epigastric pain radiating mildly to her back times 1 day.  She  reports pain as sharp and stabbing and crampy that has been worsening  throughout the day today and does not seem to be affected by food except  that it may make it worse at times.  She denies nausea, vomiting,  diarrhea, fever or chills.  She has been taking Percocet that she was  given by Dr. Lindie Spruce without relief.  She reports she was told she needed  her gallbladder taken out by Dr. [name] after having an EGD done which  does not make much since but the records are currently not available to  explain this.  The patient with a known history of using the ED for her  healthcare and not following up in clinic.  She does have a recent HIDA  scan on February 21, 2007 that was negative.  She also had a abdominal CT  on February 05, 2007 that showed questionable sludge versus small  gallstones.   REVIEW OF SYSTEMS:  No nausea, vomiting, diarrhea.  No fever, chills.  No headache, no rashes.  No dysuria.  The patient does report hot  flashes since having her TAH done earlier this year.  She denies  headache, weakness in any extremities or gait disturbance.  She  denies  suicidal or homicidal ideations or any major depressive symptoms  currently.   PAST MEDICAL HISTORY:  1. Recurrent evaluative right upper quadrant pain and epigastric pain      with an elevated lipase, currently being worked up by Dr. [name] and      scheduled for gallbladder surgery by Dr. Lindie Spruce on April 11, 2007      per the patient.  2. Chronic abdominal pain.  3. Depression.  4. Asthma.  5. G2, P2, 0-1-2.  6. Frequent emergency department visits and poor follow up in clinic.   PAST SURGICAL HISTORY:  Total abdominal hysterectomy with bilateral  salpingo-oophorectomy for chronic pelvic pain in the spring of 2008.   ALLERGIES:  ULTRAM, IBUPROFEN, TYLENOL #3, ALL CALLED ITCHY, SWOLLEN  EYES AND HIVES.   MEDICATIONS:  1. Estradiol 1 mg p.o. daily.  2. Ranitidine 150 mg p.o. b.i.d.  3. Percocet p.r.n. given to her by Dr. Lindie Spruce.  4. The patient reported is no longer taking the Cymbalta 30 mg p.o.      daily that is on her medication list from the clinic.   SOCIAL HISTORY:  The patient lives at home with her son, daughter and  boyfriend.  She quit high school in the 10th grade is not currently  working.  She denies any alcohol whatsoever, tobacco or drugs.   FAMILY HISTORY:  Diabetes and morbid obesity.   PHYSICAL EXAMINATION:  VITAL SIGNS:  Temperature 98.2, heart rate 78 to  95, respirations 16 to 22, blood pressure 105/71, 100% O2 saturation on  room air.  GENERAL:  The patient is a obese African-American female sitting upright  in bed, pleasant, no apparent distress, able to move about in the bed  without any grimacing or obvious pain.  HEENT:  Atraumatic, normocephalic.  Pupils equal, and round, reactive to  light.  Extraocular movements intact.  Moist mucous membranes.  Good  dentition.  Oropharynx clear.  NECK:  Supple.  Full range of motion.  No lymphadenopathy.  CARDIOVASCULAR:  Regular rate and rhythm.  No murmurs, rubs or gallops.  PULMONARY:  Clear to  auscultation bilaterally.  No work of breathing or  use of accessory muscles.  ABDOMINAL:  Obese.  Positive bowel sounds.  Soft, tender to palpation in  the epigastric area with positive McBurney's point.  No rebound,  guarding.  No CVA tenderness.  GU:  Deferred.  EXTREMITIES:  No edema.  SKIN:  Warm and moist.  No rashes.  Good skin turgor.  NEUROLOGICAL:  Cranial nerves II-XII intact and otherwise, nonfocal.   LABS AND IMAGING:  Showed sodium 142, potassium 3.5, chloride 107, BUN  14, creatinine 0.8, glucose 82, white blood cell count 11.2, hemoglobin  11.8, platelets 362,000.  Urine pregnancy negative.  Urinalysis showed  15 ketones but was otherwise negative.  Total bilirubin was 0.6, AST 69,  ALT 29, total protein 7.1, albumin 3.7, lipase 130.   ASSESSMENT AND PLAN:  This is a 30 year old African-American female with  acute recurrent pancreatitis admitted for pain control.  1. Acute pancreatitis.  Mild.  The patient was admitted for pain      control.  Etiology of the recurrent pancreatitis includes stone      (multiple recent ultrasounds and computerized tomography scans,      some showing stones and some without, elevated triglycerides drugs      or alcohol).  The patient denies both and only on Estrogen and HC      blocker.  HIV, elevated calcium (calcium level currently is      normal).  We will admit her, make her nothing by mouth.  Give her      intravenous fluids and treat her pain with p.r.n. Morphine or      Dilaudid.  (I suspect the patient will need more than intravenous      Morphine as she is quite narcotic experienced).  No need for PCA at      this time.  Check an abdominal ultrasound, lipids, human      immunodeficiency virus and alcohol level.  Her C-MET is currently      normal.  No need to repeat that in the next few hours.  We will      obtain records from Dr. [name] of her previous workup and possibly      consult if those records do not reveal a source or we  need further  workup at that time.  Also need to contact Dr. Dixon Boos office to      determine whether he would like to continue with the planned      surgery versus postpone it for a later date.  2. Depression.  The patient is not currently on any antidepressants      and is not showing current signs of depression or suicidal      thoughts.  Clearly, the patient has chronic pain issues.  We will      follow her mood and the notes from the primary care physician.  3. FEN/GI.  Intravenous fluids as above.  Nothing by mouth as above.      All of her electrolytes are currently normal.  We will follow and      replete as needed.   DISPOSITION:  Pending #1 resolution.      Lupita Raider, M.D.  Electronically Signed     KS/MEDQ  D:  04/10/2007  T:  04/10/2007  Job:  [id number]

## 2010-11-17 NOTE — Op Note (Signed)
NAMEJEMA, DEEGAN               ACCOUNT NO.:  [id number]   MEDICAL RECORD NO.:  [id number]          PATIENT TYPE:  WOC   LOCATION:  WOC                          FACILITY:  [facility]   PHYSICIAN:  Cherylynn Ridges, M.D.    DATE OF BIRTH:  January 27, 1981   DATE OF PROCEDURE:  04/11/2007  DATE OF DISCHARGE:                               OPERATIVE REPORT   PREOPERATIVE DIAGNOSIS:  Right upper quadrant pain with history of  normal gallbladder studies and possible pancreatitis.   POSTOPERATIVE DIAGNOSIS:  Right upper quadrant pain with history of  normal gallbladder studies and possible pancreatitis.   PROCEDURE:  Laparoscopic cholecystectomy with cholangiogram.   SURGEON:  Cherylynn Ridges, M.D.   ANESTHESIA:  General endotracheal.   ESTIMATED BLOOD LOSS:  Less than 20 mL.   COMPLICATIONS:  None.   CONDITION:  Stable.   INDICATIONS FOR OPERATION:  The patient is a 30 year old who has had  recurrent right upper quadrant pain and has been evaluated by multiple  physicians.  All of her gallbladder studies have been negative.  She was  admitted 24-48 hours ago with epigastric pain and abdominal pain with a  mildly elevated lipase of 130,  usually not typical for gallstone  pancreatitis but she was admitted for pancreatitis and she was scheduled  for surgery today.   FINDINGS:  Normal gallbladder, normal cholangiogram.   OPERATION:  The patient was taken to the operating room, placed on table  in supine position.  After an adequate general anesthetic was  administered, she was prepped and draped in usual sterile manner  exposing midline and right upper quadrant.   An infraumbilical longitudinal incision was made using #15 blade taken  down to the midline fascia.  The midline fascia was nicked  longitudinally and then we grabbed the edges with Kocher clamp x2 and  tented up on it as we bluntly dissected down into the peritoneal cavity  using Kelly clamp.  Once in the peritoneal cavity, a  pursestring suture  of 0 Vicryl was passed around the fascial opening and then a Hassan  cannula passed into the peritoneal cavity.  It was secured in place with  a pursestring suture.   Carbon dioxide insufflation was instilled through the Hassan cannula up  to maximal intra-abdominal pressure of 50 mmHg.  Once this was done, two  right costal margin 5-mm cannulas and a subxiphoid 12 mm cannula passed  under direct vision.  Once all these were in place, the patient was  placed in reverse Trendelenburg.  The left-side was tilted down.   The gallbladder was noted to be robin's egg blue without any evidence of  inflammation.  This was retracted towards right upper quadrant and the  anterior abdominal wall.  The infundibulum was grasped with a second  grasper and we dissected out the peritoneum overlying the triangle of  Calot and hepatoduodenal triangle.  During this process we had isolated  the cystic duct and cystic artery.  We placed a clip proximally and  distally x2 on the cystic artery and transected it.  The cystic duct was  clipped along the gallbladder side and we made cholecystodochotomy using  laparoscopic scissors.  A Cook catheter which had been passed through  the anterior abdominal wall was cannulated into the cholecystodochotomy  through which we performed cholangiogram which showed good flow into the  duodenum, good proximal filling, no dilatation, no filling defects.  The  cystic duct was about 3 cm long.   Once cholangiogram was completed, we insufflated the abdomen again  removed the clip that was securing the cholangiocatheter in place and  then distally clipped the cystic duct x2 and transected it.  We then  dissected out the gallbladder from its bed with minimal difficulty and  then retrieved it from the infraumbilical fascial site with minimal  difficulty.  All counts were correct.  We tied off the infraumbilical  fascia using a pursestring suture which was in  place.  We inspected the  bed well.  There was minimal to no bleeding and aspirated all fluid and  gas from above the liver then removed all cannulas.   0.25% Marcaine with epi was injected all sites and the infraumbilical  and subxiphoid sites were closed using running subcuticular stitch of 4-  0 Vicryl.  Dermabond, Steri-Strips and Tegaderm dressings were applied  to the wound.  Subsequently the patient was taken to recovery room in  stable condition.  All needle counts, sponge counts and instrument  counts were correct.      Cherylynn Ridges, M.D.  Electronically Signed     JOW/MEDQ  D:  04/11/2007  T:  04/11/2007  Job:  [id number]

## 2010-11-17 NOTE — Discharge Summary (Signed)
NAMEJAMIYA, [name]               ACCOUNT NO.:  [id number]   MEDICAL RECORD NO.:  [id number]          PATIENT TYPE:  EMS   LOCATION:  ED                           FACILITY:  [facility]   PHYSICIAN:  Fleet Contras, M.D.    DATE OF BIRTH:  [birth year]   DATE OF ADMISSION:  11/18/2008  DATE OF DISCHARGE:  11/18/2008                               DISCHARGE SUMMARY   HISTORY OF PRESENTING ILLNESS:  This is an another admission for this 30-  year-old [place] American lady with past medical history significant for  recurrent acute pancreatitis status post cholecystectomy, presented to  the Emergency Room at [facility] with another episode of severe  abdominal pain in the epigastrium and mid abdomen associated with  nausea, vomiting, inability to keep her food or drinks down.  She had  been seen in the emergency room twice on that day and was therefore,  thought to require inpatient treatment.  She was in moderate-to-severe  painful distress.  Her vital signs were stable.  Laboratory data showed  elevated lipase at over 300, but her white count and chemistry panel  essentially were within normal limits.  She was therefore admitted to  the hospital for pain management and intravenous fluid therapy.   HOSPITAL COURSE:  On admission, the patient was started on IV fluids, IV  Dilaudid, and IV Zofran.  Benadryl was given for itching which she  stated results from the medications.  Her symptoms were slowly improved  and her diet was slowly advanced and she was able to tolerate a full  diet without any vomiting or diarrhea.  Reglan 5 mg t.i.d. a.c. was  added to help with keeping her food down, which she tolerated.  She  received Protonix IV initially, then p.o. for GI prophylaxis as well as  subcutaneous Lovenox for DVT prophylaxis.  She made several confliction remarks about myself and the nurses,  initially stating that I was admitting her to the hospital to make money  off of her and then  telling the nurses that the pain medicines were not  working and I was not adjusting her medicines as needed.  She wanted to  get another attending physician to take care of her in the hospital.  She then told me that she had a nurse to call me to adjust her pain  medication, but the nurses stated that they paged me three times and I  did not respond.  The nurses stated that she specifically told them not  to page me, but she wanted them to made changes to her medication and  they stated that they could not without my authorization.  A psychiatric consult was requested and the patient was kindly seen by  Dr. Antonietta Breach, who thought that she had anxiety disorder, feeling  on edge, muscle tension, periods of insomnia, but did not think she  needed pharmacotherapy, but that she will be a candidate for  psychotherapy or counseling for coping skills and stress management.  On Nov 16, 2008, she was able to tolerate full diet with no vomiting.  Vital signs  shows a blood pressure of 114/81, heart rate of 95,  respiratory rate of 20, temperature 98.9, O2 sats on room air was 97%.  She was not pale.  She was not icteric.  She was not cyanosed.  She was  well hydrated.  Her chest was clear to auscultation.  Heart sounds were  not well heard.  Abdomen was soft, nontender, no masses.  Bowel sounds  were present.  Extremities showed no edema, calf tenderness, or  swelling.  CNS, she was alert and oriented x3 with no focal neurological  deficits.  Her latest laboratory data showed a lipase of 48.  Sodium was  140, potassium 4.0, chloride 100, bicarbonate of 29, glucose was 100,  BUN 5, creatinine 0.84, AST was 112, ALT 204, alkaline phosphatase 114,  total bilirubin of 0.6.  Her white count was 6.2, hemoglobin 12.4,  hematocrit 35.7, platelet count of 252.  She was therefore considered  stable for discharge home.   DISCHARGE DIAGNOSES:  1. Acute on chronic pancreatitis.  2. Anxiety disorder.  3.  Intractable nausea and vomiting.   DISCHARGE MEDICATIONS:  1. Dilaudid 2 mg one p.o. q.6 p.r.n. for pain.  2. Benadryl 25 mg p.o. q.6 p.r.n. for itching.  3. Phenergan 25 mg q.6 h. p.r.n. for nausea and vomiting.  4. Reglan 5 mg t.i.d. a.c.  5. Claritin 10 mg p.o. daily.  6. Flonase 2 sprays each nostril once a day.   Follow up was to be with me p.r.n. in the office.  I explained to her  that I would not be admitting her back into the hospital.  She will need  to go under the hospitalist group if she returned to the emergency room  for hospitalization due to the conflicts of interest with her  accusations on this admission.  She understands this.  This plan of care  has been discussed with her and her questions were answered.      Fleet Contras, M.D.  Electronically Signed     EA/MEDQ  D:  11/18/2008  T:  11/18/2008  Job:  [id number]

## 2010-11-17 NOTE — Discharge Summary (Signed)
NAMEANALEISE, MCCLEERY               ACCOUNT NO.:  [id number]   MEDICAL RECORD NO.:  [id number]          PATIENT TYPE:  INP   LOCATION:  1523                         FACILITY:  [facility]   PHYSICIAN:  Fleet Contras, M.D.    DATE OF BIRTH:  05-Nov-1980   DATE OF ADMISSION:  10/20/2008  DATE OF DISCHARGE:  10/24/2008                               DISCHARGE SUMMARY   HISTORY OF PRESENT ILLNESS:  [name] is a 30 year old African-American  with multiple hospitalizations due to recurrent acute pancreatitis.  She  has had a cholecystectomy done.  She does not drink any alcohol.  She  has no evidence of hypertriglyceridemia, and the cause of her recurrent  pancreatitis is unknown.  She has chronic pain related to chronic  abdominal pain and has been hospitalized multiple times with recurrent  symptoms.  She presented at this time with another episode of severe  abdominal pain in the epigastrium and mid abdomen associated with nausea  and vomiting and unable to keep food or drinks down.  In the emergency  room, she was in moderate-to-severe painful distress.  Her vital signs  were stable and the abdomen was tender in the midepigastric region with  no palpable masses.  No rebound tenderness or guarding.  Bowel sounds  were present.  Laboratory data showed a markedly elevated lipase at over  400.  Her white count was within normal limits, and chemistry panel was  also within normal limits.  She was therefore admitted to the hospital  for intravenous pain management and intravenous fluids.   HOSPITAL COURSE:  On admission, the patient was started on IV fluids, IV  Dilaudid, IV Zofran.  She stated that the Zofran made her more nauseous  and vomiting, and she was therefore switched to IV Phenergan.  Benadryl  was given for itching of the skin which helped.  Her pain more or less  localized to the right side on her abdomen, and she also complained of  painful urination.  Urinalysis was positive for  leukocyte esterase and 3-  6 WBCs per high-power field.  She was therefore started on oral  ciprofloxacin 250 mg b.i.d.  She was also on IV Protonix since admission  and DVT prophylaxis with subcutaneous Lovenox.  Her condition continued  to improve slowly.  Due to persistent of her pain on the right side, a  CT scan of the abdomen and pelvis with stone protocol was performed.  This did not reveal any calculus in the renal region, but there was  evidence of fecal loading in her right colon due to constipation.  She  was therefore given Dulcolax, as well as magnesium citrate, which she  tolerated and helped her bowel movements.  She had some pain in the  right abdomen, but not as severe.  She is tolerating a full diet with no  further vomiting.  She had a good bowel movement yesterday, and today  she is ambulant, and she had afebrile.  Vital signs are stable.  Her  chest is clear to auscultation.  She is tender in the right lower  quadrant  with no guarding or rebound tenderness.  Bowel sounds are  present.   LABORATORY DATA:  AST is 66, ALT 162, alkaline phosphatase 105, total  bilirubin 0.4.  Urine culture is so far negative.  Sodium is 139,  potassium 4.1, chloride 106, bicarbonate of 28, BUN is 6, creatinine  0.68 and glucose is 113.  She is now considered stable for discharge  home.   DISCHARGE DIAGNOSES:  1. Acute pancreatitis.  Her latest lipase level was 31.  2. Urinary tract infection.  3. Constipation.  4. History of allergic rhinitis.  5. Chronic pain syndrome.   CONDITION ON DISCHARGE:  Stable.   DISPOSITION:  Home.   FOLLOWUP:  Follow up will be with me in the office in 1-2 weeks.   DISCHARGE MEDICATIONS:  1. Dilaudid 2 mg one p.o. q.6h. p.r.n.  2. Phenergan 25 mg one p.o. q.6h. p.r.n.  3. Ciprofloxacin 250 mg one p.o. b.i.d. for 5 days.  4. Dulcolax 2 tablets p.o. daily p.r.n. to help with constipation.  5. Ambien 10 mg p.o. q.h.s. p.r.n. for sleep.  6. Prilosec  20 mg two p.o. daily.  7. Flonase 2 sprays each nostril once a day.  8. Claritin 10 mg p.o. daily OTC.   The discharge plan was discussed with her and her questions answered.      Fleet Contras, M.D.  Electronically Signed     EA/MEDQ  D:  10/24/2008  T:  10/24/2008  Job:  [id number]

## 2010-11-17 NOTE — Discharge Summary (Signed)
[name], [name]               ACCOUNT NO.:  [id number]   MEDICAL RECORD NO.:  [id number]          PATIENT TYPE:  WOC   LOCATION:  WOC                          FACILITY:  [facility]   PHYSICIAN:  Towana Badger, M.D.       DATE OF BIRTH:  07-06-1980   DATE OF ADMISSION:  04/09/2007  DATE OF DISCHARGE:  04/11/2007                               DISCHARGE SUMMARY   PRIMARY CARE Salvatrice Morandi:  Luretha Murphy, nurse practitioner, [facility]  [facility].   DISCHARGE DIAGNOSES:  1. Elevated lipase.  2. Chronic abdominal/pelvic pain with probable narcotic dependence for      pain control.  3. Depression.   DISCHARGE MEDICATIONS:  1. Estradiol 1 mg p.o. every day.  2. Percocet 5/325 mg one to two tablets every 4 hours p.r.n. pain.   CONSULTATIONS:  None.   Of note, the patient had been scheduled for elective cholecystectomy  prior to admission.  This was performed on April 11, 2007 by Dr. Lindie Spruce.   PROCEDURE:  Abdominal ultrasound performed on April 10, 2007 showed no  acute process, echogenic liver consistent with fatty liver disease.  No  evidence of acute pancreatitis.   LABORATORY DATA:  Sodium 142, potassium 3.5, chloride 107, BUN 14,  creatinine 0.8, glucose 82.   White blood cells 11.2, hemoglobin 12.9, hematocrit 38, platelets 36.2.   Total bilirubin 0.6, direct bilirubin 0.2, AST 28, ALT 18, alkaline  phosphatase 69, lipase 130.   Total cholesterol 186, triglycerides 263, HDL 35, LDL 98.   Urine pregnancy test negative.  Alcohol negative.   BRIEF HOSPITAL COURSE:  This is a 30 year old female with recurrent  acute pancreatitis admitted with acute epigastric pain, mildly elevated  lipase.   PAST MEDICAL HISTORY:  Significant for:  1. Depression.  2. Asthma.  3. Chronic abdominal/pelvic pain with multiple recent ED visits.   PROBLEM LIST:  1. Elevated lipase:  On admission, patient with mildly elevated lipase      to 130, elevated triglycerides to 263, possible  cause of this mild      elevation.  Of note, the patient is on estrogen therapy following      total abdominal hysterectomy/bilateral salpingo-oophorectomy;  this      could also be another likely cause.  The patient cycled on      morphine, Dilaudid intravenous for pain control, then to Norco p.o.      and discharged on Percocet.  The patient was transferred in stable      condition to surgery on April 11, 2007 for a previously scheduled      elective cholecystectomy.  Cholecystectomy was performed with no      complications.  However, there was a normal gallbladder on      inspection.  The patient did well postoperatively.  The patient was      afebrile.  Vital signs stable over hospital course.  Benign      physical exam on discharge.  Pain control was with Percocet on      discharge per Dr. Lindie Spruce of surgery.  2. Chronic pain/probable narcotic  dependence:  The patient on Percocet      prior to admission prescribed by Dr. Lindie Spruce of surgery.  The patient      was switched to morphine intravenous then to lower equivalent dose      of Dilaudid intravenous, then to Norco p.o.  Dr. Lindie Spruce of surgery      has written the patient for a supply of 30 Percocet.  She will      followup in his clinic in 2 to 3 weeks.  At this time, I also ask      that Luretha Murphy please followup on the patient's pain control      issues in clinic.  As stated before, of note, the patient's      gallbladder was found to be normal following cholecystectomy.  3. Depression:  Patient with no symptoms of depression during hospital      admission.  No medications were prescribed for depression during      hospital course.   DISCHARGE INSTRUCTIONS:  1. The patient may shower, but should not immerse wounds for 2 to 3      weeks.  Wash surgical site with mild soap and water, but not scrub      vigorously.  2. Remove any surgical dressings in 48 hours.  The patient may remove      any remaining Steri-Strips after 7 days.   3. Do not drive while taking narcotics.  4. Call surgery clinic for fever more than 101.5, uncontrolled nausea      or vomiting, pain uncontrolled with p.o. medication, a purulent      discharge or increased bleeding from wounds.  5. Attend followup appointments with Family Practice and with surgery.   FOLLOWUP APPOINTMENTS:  1. The patient has a followup appointment with Luretha Murphy, nurse      practitioner, at [facility] on April 24, 2007 at 8:30 a.m.  2. The patient has a followup appointment with Dr. Lindie Spruce in the      surgery clinic.  They will set up the date and time of this      appointment, but it should be 2 to 3 weeks from date of surgery.   DISCHARGE CONDITION:  The patient was discharged to home in stable  medical condition.     ______________________________  Bobby Rumpf    ______________________________  Towana Badger, M.D.    KC/MEDQ  D:  04/11/2007  T:  04/12/2007  Job:  [id number]   cc:   Orion Crook, M.D.

## 2010-11-17 NOTE — Discharge Summary (Signed)
[name], [name]               ACCOUNT NO.:  [id number]   MEDICAL RECORD NO.:  [id number]          PATIENT TYPE:  INP   LOCATION:  5503                         FACILITY:  [facility]   PHYSICIAN:  Wayne A. Sheffield Slider, M.D.    DATE OF BIRTH:  10-21-1980   DATE OF ADMISSION:  09/29/2007  DATE OF DISCHARGE:  09/30/2007                               DISCHARGE SUMMARY   PRIMARY CARE PHYSICIAN:  Luretha Murphy, Nurse Practitioner, at [facility].   DISCHARGE DIAGNOSES:  1. Nausea and vomiting, resolved.  2. Chronic pancreatitis.  3. Chronic abdominal pain.  4. Depression and anxiety.   DISCHARGE MEDICATIONS:  1. Zoloft 50 mg p.o. daily.  2. Ambien 5 mg p.o. nightly as needed, as previously instructed by      Luretha Murphy.  3. OxyContin 40 mg as prescribed by Pain Clinic.  4. Zofran 8 mg every 8 hours as needed for nausea, prescription      provided were #30 with no refills.  5. Tylenol 650 mg p.o. every 6 hours as needed for headache or pain.   BRIEF HOSPITAL COURSE:  This is a 30 year old African American female  with history of chronic pancreatitis and chronic abdominal pain for  which she is followed at the [facility].  The patient presented to  clinic on the day of admission with nausea and vomiting and brought  paperwork from emergency room visit the day prior which showed  significantly elevated lipase in the 500s.  The patient continued to  complain of persistent pain in addition to her nausea and vomiting and  thus was sent for admission by her primary care [name] Luretha Murphy for  suspicion of acute on chronic pancreatitis flare.   PROBLEMS:  1. Acute on chronic pancreatitis:  The patient's laboratory data was      repeated upon admission and revealed normal liver function studies      except for mildly elevated AST and ALT at 48 and 38 respectively.      Lipase was normal at 26 and amylase was normal at 108.  The patient      was continued on IV  Dilaudid throughout the night and her NG tube      was pulled once these normal lab results came back.  She      immediately asked to be started on a diet and begin taking oral      clears with advancement as tolerated.   By following morning, the patient had nausea and a single episode of  vomiting since being in the hospital.  Her nausea was treated with  Zofran as needed.  For this reason, it was felt that she no longer had  an acute pancreatitis flare, and her IV Dilaudid was stopped.  She was  begun on her home regimen of OxyContin 40 mg b.i.d., and her Zofran was  switched to oral.  Her IV fluids were also stopped.  The patient  tolerated advancement of diet, eating regular breakfast without any  subsequent vomiting.  She was sluggish to take oral intake and  continued  to complain of pain, asking for additional IV pain medications and  refusing anything else offered to her other than her home dose of  OxyContin.  After the course of the day, when the patient continued to  exhibit absence of vomiting and was tolerating oral medications without  problems as well as food and liquids.  It was decided that the patient  should be medically stable for discharge.  1. Nausea and vomiting:  The patient's vomiting resolved upon      admission and placement of NG tube as she had no episodes of      vomiting during her hospitalization.  She was given Zofran IVs and      orally as needed for symptoms of nausea.  She was also given a      prescription of this at the time of discharge.  2. Depression.  The patient was continued on her home medications of      Zoloft and Ambien.   PERTINENT LABORATORY DATA:  As stated above, electrolytes were normal on  admission with a sodium of 140, potassium of 3.7, bicarbonates of 25,  glucose of 84, BUN of 7, and creatinine of 0.73.  LFTs were within  normal limits with the exception of a mildly elevated AST of 48 and ALT  of 38.  Lipase was normal at 26 and  amylase was normal at 108.  CBC  showed a normal white blood cell count of 7.6, hemoglobin of 12.3, and  platelets of 289.  Repeat laboratory studies on the day of discharge  were essentially unchanged.  Fasting lipid panel in the morning prior to  discharge showed a total cholesterol of 154, triglycerides of 133, HDL  of 36, and LDL of 91.   FOLLOWUP APPOINTMENTS:  The patient is to call the [facility] on Monday morning for a followup appointment with Luretha Murphy.   DISPOSITION:  The patient was discharged home in stable medical  condition.      Drue Dun, M.D.  Electronically Signed      Wayne A. Sheffield Slider, M.D.  Electronically Signed    EE/MEDQ  D:  09/30/2007  T:  10/01/2007  Job:  [id number]

## 2010-11-20 NOTE — Group Therapy Note (Signed)
[name], [name]               ACCOUNT NO.:  [id number]   MEDICAL RECORD NO.:  [id number]          PATIENT TYPE:  WOC   LOCATION:  [facility]                   FACILITY:  [facility]   PHYSICIAN:  Argentina Donovan, MD        DATE OF BIRTH:  [birth year]   DATE OF SERVICE:                                  CLINIC NOTE   The patient is a 30 year old African American female gravida 3, para 1-1-  1-2, who has had severe chronic pelvic pain especially in the right  side, underwent diagnostic laparoscopy on January 14 which showed pelvic  endometriosis. The patient has asked for definitive treatment, I told  her I would not do a hysterectomy and take out her ovaries at the age of  38 unless medical treatment failed. I am going to start her on Depo-  Lupron 11.25 and I will repeat her injection in 3 months, i.e. the  beginning of June and after 6 months if she is not satisfied with the  result then I will do what she wishes as far as surgery.   IMPRESSION:  Pelvic endometriosis.   PLAN:  Depo-Lupron suppression.           ______________________________  Argentina Donovan, MD     PR/MEDQ  D:  08/03/2006  T:  08/03/2006  Job:  [id number]

## 2010-11-20 NOTE — Group Therapy Note (Signed)
[name], [name]               ACCOUNT NO.:  [id number]   MEDICAL RECORD NO.:  [id number]          PATIENT TYPE:  WOC   LOCATION:  [facility]                   FACILITY:  [facility]   PHYSICIAN:  Argentina Donovan, MD        DATE OF BIRTH:  06/19/1981   DATE OF SERVICE:  08/17/2006                                  CLINIC NOTE   HISTORY OF PRESENT ILLNESS:  The patient is a 30 year old African-  American female underwent diagnostic laparoscopy in January and found to  have moderate endometriosis, treated with Depo-Lupron and plans to get a  second shot.  I will do that in April.  She has been going multiple  places for her drugs.  I have told her that if I hear she goes any place  for drugs except from me, I will not give her any further drugs or will  I further treat her.  I will give her Percocet, supply her with that.  We tried her on Fiorinal compound #3, initiated several reactions to it.  We will see her back the end of March.  If she is not better, I told her  that we would go ahead and do a hysterectomy as she desired in the  beginning in spite of the fact that she is so young.  She says she wants  no further children.   IMPRESSION:  Pain management for endometriosis.   TREATMENT:  Depo-Lupron.           ______________________________  Argentina Donovan, MD     PR/MEDQ  D:  08/17/2006  T:  08/17/2006  Job:  [id number]

## 2010-11-20 NOTE — Group Therapy Note (Signed)
[name], [name]               ACCOUNT NO.:  [id number]   MEDICAL RECORD NO.:  [id number]          PATIENT TYPE:  WOC   LOCATION:  [facility]                   FACILITY:  [facility]   PHYSICIAN:  Tinnie Gens, MD        DATE OF BIRTH:  02/12/1981   DATE OF SERVICE:                                  CLINIC NOTE   CHIEF COMPLAINT:  Preoperative evaluation for hysterectomy.   HISTORY OF PRESENT ILLNESS:  This is a 30 year old African American  female with endometriosis, abnormal uterine bleeding and chronic pelvic  pain.  Failed Depo-Lupron, is here for preoperative evaluation for  hysterectomy.  We talked about some of the risk and benefits of the  procedure and the risks and benefits of hormone replacement therapy,  specifically estrogen replacement therapy for a women of her age.  She  has already had 2 children and does not want to have anymore children.   OBJECTIVE:  Temperature 97.2, pulse 110, blood pressure 123/79, weight  175.8 pounds, height is 5 feet 5 inches.  GENERAL:  No acute distress, alert and oriented, appropriate mood and  affect, overweight African American female, otherwise well developed,  well nourished.  PULMONARY:  Clear to auscultation bilaterally.  CARDIOVASCULAR:  Regular rate and rhythm and no murmurs.  ABDOMEN:  Soft, nontender, normoactive bowel sounds. No  hepatosplenomegaly.  No CVA tenderness.  No edema bilateral ankles.  Has 2+ dorsalis pedis pulses.  Has 2+  carotid pulses.  Cranial nerves 2 through 12 intact.  Gait normal.  Bilateral upper and lower extremities normal sensation and strength.   ASSESSMENT/PLAN:  A 30 year old African American female with chronic  pelvic pain and endometriosis.  She will be having her total abdominal  hysterectomy with bilateral salpingo-oophorectomy on October 04, 2006.  She  is set up for preoperative lab draws, etc., on the 31st of March.  At  this point, it sounds like she is interested in estrogen replacement  therapy.  She was given Percocet 5/325 mg # 12, no refills to last her  until the surgery.     ______________________________  [name]    ______________________________  Tinnie Gens, MD    AL/MEDQ  D:  09/29/2006  T:  09/29/2006  Job:  [id number]

## 2010-11-20 NOTE — Group Therapy Note (Signed)
[name], [name]               ACCOUNT NO.:  [id number]   MEDICAL RECORD NO.:  [id number]          PATIENT TYPE:  WOC   LOCATION:  [facility]                   [facility]ACILITY:  [facility]   PHYSICIAN:  Argentina Donovan, MD        DATE OF BIRTH:  [birth year]   DATE OF SERVICE:  07/07/2006                                  CLINIC NOTE   See previous clinic note. The patient has returned to the clinic because  of increasing pain. Has not heard about her scheduling yet. I called the  office, and they are going to try and work on getting her scheduled as  soon as possible as they had planned on scheduling her in March which is  not sufficient. I have discussed it with the patient. I told her my  biggest fear is that we will not find anything wrong. In addition to her  chronic pelvic pain, her significant other is pushing me because he  wants to have a baby with her and she swears that this is not adding to  the point of increasing her pain. She also says it is much worse after  she eats though this has to be taken into consideration also.   IMPRESSION:  Chronic pelvic pain of unknown etiology.           ______________________________  Argentina Donovan, MD     PR/MEDQ  D:  07/07/2006  T:  07/07/2006  Job:  [id number]

## 2010-11-20 NOTE — H&P (Signed)
NAMEFABIOLA, MUDGETT               ACCOUNT NO.:  [id number]   MEDICAL RECORD NO.:  [id number]         PATIENT TYPE:  WOBV   LOCATION:                                [facility]   PHYSICIAN:  Phil D. [name], M.D.     DATE OF BIRTH:  January 27, 1981   DATE OF ADMISSION:  10/08/2006  DATE OF DISCHARGE:                              HISTORY & PHYSICAL   CHIEF COMPLAINT:  Abdominal pain at hysterectomy on Monday.   HISTORY OF PRESENT ILLNESS:  The patient is a 30 year old, black female,  G3, P3-0-0-3, who underwent total vaginal hysterectomy and bilateral  salpingo-oophorectomy on October 03, 2006, who comes into the MAU  complaining of severe crampy, lower abdominal pain radiating into her  back which has not been relieved by the Percocet she has been taking.  The patient had not had a bowel movement since surgery.   PAST MEDICAL HISTORY:  The patient has always complained of pelvis pain  and underwent laparoscopy approximately 6 months ago and was found to  have moderate endometriosis with some marked implants on the uterosacral  ligament areas.   SOCIAL HISTORY:  The patient does not smoke or take narcotics or drink.   FAMILY HISTORY:  Noncontributory.  No history of endometriosis.   ALLERGIES:  No known drug allergies.   MEDICATIONS:  Percocet and Motrin for pain.   REVIEW OF SYSTEMS:  Negative with the exception of the present illness.   PHYSICAL EXAMINATION:  VITAL SIGNS:  Blood pressure 103/70, pulse 68 per  minute, respirations 14 per minute, temperature 98.9.  GENERAL:  A well-developed, well-nourished, African-American female in  moderate distress with knees bent and hands on lower abdomen.  HEENT:  Within normal limits.  PERRLA.  Normocephalic.  NECK:  Supple.  Thyroid symmetrical with no masses.  LUNGS:  Clear to auscultation and percussion.  HEART:  No murmur, normal sinus rhythm.  ABDOMEN:  Soft, but distended to 3+ with no bowel sounds, no rebound and  some guarding on  deep palpation.  BACK:  Erect.  No CVA tenderness.  GENITALIA:  External genitalia normal, BUS within normal limits.  Moderate amount of vaginal serosanguineous discharge.  The apex of the  vagina was clean.  No pelvic mass was palpated.  RECTAL:  Failed to reveal any fecal impaction, although there was stool  in the rectum.  EXTREMITIES:  Negative with edema and no varices.  DTRs were within  normal limits.   IMPRESSION:  Postoperative ileus.   PLAN:  Admit the patient for hydration, observation and will attempt to  use Fleet enema to help relieve the patient's present symptoms.           ______________________________  Javier Glazier [name], M.D.     PDR/MEDQ  D:  10/08/2006  T:  10/08/2006  Job:  [id number]

## 2010-11-20 NOTE — Discharge Summary (Signed)
   NAME:  KARISA, NESSER                        ACCOUNT NO.:  [id number]   MEDICAL RECORD NO.:  [id number]                   PATIENT TYPE:  OBV   LOCATION:  9104                                 [facility]   PHYSICIAN:  Hal Morales, M.D.             DATE OF BIRTH:  [birth year]   DATE OF ADMISSION:  01/13/2003  DATE OF DISCHARGE:  01/14/2003                                 DISCHARGE SUMMARY   ADMITTING DIAGNOSES:  1. Intrauterine pregnancy at 13 weeks.  2. Hyperemesis.   DISCHARGE DIAGNOSES:  1. Intrauterine pregnancy at 13 weeks.  2. Hyperemesis.   PROCEDURES:  IV hydration.   HISTORY OF PRESENT ILLNESS:  Ms. Michail Jewels is a 30 year old gravida 2 para 1-0-  0-1 at approximately [redacted] weeks gestation who presented to [facility] on  the evening of January 13, 2003 with nausea and vomiting.  She has arranged to  see an obstetrician in [place], was hospitalized at [facility] on  July 7-9, 2004 for nausea and vomiting of pregnancy, she was discharged home  with p.o. Phenergan and p.o. Vicodin but was unable to keep those down.  She  then traveled to [place] to visit with her mother to celebrate her son's  birthday here with her family but then began to have severe nausea and  vomiting again.  Pregnancy has been remarkable for hyperemesis with last  pregnancy for the entire pregnancy.  Ultrasound showed a 13-week  intrauterine pregnancy.   HOSPITAL COURSE:  The patient was admitted for overnight observation,  received IV fluids with Phenergan, initially orthostatically she had slight  pulse changes however, by the next morning she did have a little bit of  dizziness, orthostatics were repeated and were improved, she was voiding  without difficulty, she was able to tolerate a small amount of breakfast,  lunch, and dinner.  At dinner she began to request tuna salad and was able  to take p.o. fluids, her vital signs were stable, she was afebrile, she had  a care  management consult while she was here, late in the afternoon she was  deemed to have received full benefit of her hospital stay and was discharged  home.   DISCHARGE INSTRUCTIONS:  The patient is to advance her diet as tolerated but  is to avoid any greasy or fatty foods.   DISCHARGE MEDICATIONS:  Zofran ODT 4 mg one p.o. b.i.d. p.r.n.   DISCHARGE FOLLOWUP:  Discharge followup was to occur with her [facility]  physician on Tuesday as previously noted or to return to [facility] for  care by teaching service p.r.n.     Renaldo Reel Emilee Hero, C.N.M.                   Hal Morales, M.D.    VLL/MEDQ  D:  01/14/2003  T:  01/15/2003  Job:  [id number]

## 2010-11-20 NOTE — Op Note (Signed)
NAMESUSANNE, [name]               ACCOUNT NO.:  [id number]   MEDICAL RECORD NO.:  [id number]         PATIENT TYPE:  WOIB   LOCATION:                                [facility]:  [facility]   PHYSICIAN:  Allie Bossier, MD        DATE OF BIRTH:  Jun 02, 1981   DATE OF PROCEDURE:  DATE OF DISCHARGE:  10/05/2006                               OPERATIVE REPORT   PREOPERATIVE DIAGNOSIS:  Chronic pelvic pain with laparoscopy-proven  endometriosis.   POSTOPERATIVE DIAGNOSIS:  Chronic pelvic pain with laparoscopy-proven  endometriosis.   PROCEDURE:  Vaginal hysterectomy and bilateral salpingo-oophorectomy.   SURGEONS:  Myra C. Marice Potter, M.D. and Lesly Dukes, M.D.   ANESTHESIA:  GETA.   COMPLICATIONS:  None.   ESTIMATED BLOOD LOSS:  100 mL.   SPECIMENS:  Uterus, tubes and ovaries.   DETAILED PROCEDURE AND FINDINGS:  Preoperatively, the risks, benefits  and alternatives of surgery were explained, understood and accepted.  The day of surgery, I again counseled her regarding the need for  postoperative hormone replacement therapy and its possible risks.  She  understood and agreed.  She was taken to the operating room, general  anesthesia was started without complications.  She was placed in the  dorsal lithotomy position.  Sequential compression devices were placed  and were functioning.  Her vagina was prepped and draped in the usual  sterile fashion.  Her bladder was emptied with a Foley catheter.  It was  then clamped.  Bimanual examination revealed normal size and shape,  mobile uterus, and non enlarged adnexa.  A weighted speculum was placed  posteriorly, a Deaver anteriorly.  The cervix was grasped with a single-  toothed tenaculum.  A total of 50 mL of 0.5% Marcaine with epinephrine  and 50 mL of normal saline were injected in a circumferential fashion at  the cervicovaginal junction.  A circumferential incision was made at  this site.  The posterior peritoneum was entered and the  anterior  peritoneum was identified and entered with Metzenbaum scissors also.  The uterosacral ligaments were clamped, cut and ligated.  Please note  that 2-0 Vicryl suture was used throughout this case, unless otherwise  specified.  Heaney sutures were used unless otherwise specified as well.  The remainder of the cervical and uterine attachments were removed with  the same, clamp, cut and ligate technique.  Great care was taken to  avoid the bowel and the bladder and ureters.  The uterus was removed.  Pedicles were inspected and noted to be hemostatic.  Each ovary was in  turn grasped and along with the ova duct, these were both clamped, cut  and ligated.  Excellent hemostasis was noted again at all pedicles.  Please note that she was in Trendelenburg position throughout the case.  The vaginal cuff was closed in a vertical running, locking fashion after  excellent hemostasis was assured.  She tolerated the procedure well.  She was extubated and taken to the recovery room in stable condition.  The Foley catheter was allowed to drain and clear urine was noted.  The  instrument, sponge and needle counts were correct.      Allie Bossier, MD  Electronically Signed     MCD/MEDQ  D:  10/04/2006  T:  10/04/2006  Job:  [id number]

## 2010-11-20 NOTE — Group Therapy Note (Signed)
[name], [name]               ACCOUNT NO.:  [id number]   MEDICAL RECORD NO.:  [id number]          PATIENT TYPE:  WOC   LOCATION:  [facility]                   FACILITY:  [facility]   PHYSICIAN:  Argentina Donovan, MD        DATE OF BIRTH:  1981-03-07   DATE OF SERVICE:                                  CLINIC NOTE   CHIEF COMPLAINT:  Pelvic pain.   HISTORY OF PRESENT ILLNESS:  The patient is a 30 year old African-  American female who has been present here at the [facility] for pelvic  pain, referred by [facility], Hal Hope, as well as  multiple ER visits and MAU visits for pelvic pain.  The patient's most  recent MAU visit was on December 7 for pelvic pain.  She states it is a  sharp right-sided pain that gets to a point where she cannot walk.  She  states it is an 8/10.  Her last menstrual period was on December 1.  She  was recently on Loestrin birth control pills, but she stopped it on  December 1.  She is wanting to get pregnant.  However, she did not note  any benefit to the pain while on birth control pills.  The patient has  been recently seen by a surgeon and was noted to also have no clear  etiologies.  The patient was last seen in the GYN Clinic by Dr. [name] on  February 10, 2006 and at that point he suggested that, if it did not get  any better and continued to persist, to go ahead and do a laparoscopic  exam.  The patient would like to have a laparoscopy done.   REVIEW OF SYSTEMS:  Otherwise, the review of systems is negative.   PHYSICAL EXAM:  Her weight is 168.5 pounds, her height is 5.6.  Temperature 98.2, pulse 85, blood pressure 124/81.  GYN:  Noted pelvic pain produced with palpation of the right lower  uterine segment.  There was pain with palpation of her right ovary.  No  other pain with palpation elicited.  There was noted white vaginal  discharge.  The patient does have a history of VD in the past.  We went  ahead and sent it off for a wet prep.   Otherwise, her cervix, uterus and  ovaries are all within normal, except for the right side with the  elicited pelvic pain.   EVIDENCE EXAMS:  CT with contrast of the abdomen performed on November  27 showed no acute abnormality and a 1.5 cm probable left Bartholin  cyst.  Pelvic ultrasound was also negative on the same date, which  showed physiologic trace of free pelvic fluid, anteverted uterus at 8.3  x 4.2 x 6.  Normal ovaries bilaterally.   ASSESSMENT AND PLAN:  1. This is a 30 year old with abnormal pelvic pain.  We will go ahead      and proceed with a laparoscopy.  Dr. [name] to be the surgeon.      Pelvic exam today was normal.  2. The patient is to be called from the gynecologic  clinic for further      evaluation for times and lab work to be done.  3. For her vaginal discharge, we went ahead and sent off a wet prep      today.  Followup after operation.     ______________________________  Barth Kirks, MD    ______________________________  Argentina Donovan, MD    MB/MEDQ  D:  06/30/2006  T:  06/30/2006  Job:  [id number]

## 2010-11-20 NOTE — Discharge Summary (Signed)
NAME:  [name], [name]                        ACCOUNT NO.:  [id number]   MEDICAL RECORD NO.:  [id number]                   PATIENT TYPE:  INP   LOCATION:  9118                                 [facility]ILITY:  [facility]   PHYSICIAN:  Tanya S. Shawnie Pons, M.D.                DATE OF BIRTH:  1981/04/11   DATE OF ADMISSION:  07/02/2003  DATE OF DISCHARGE:                                 DISCHARGE SUMMARY   ATTENDING:  Shelbie Proctor. Shawnie Pons, M.D.   FINAL DIAGNOSES:  1. Intrauterine pregnancy at 13 and two-sevenths weeks, active labor,     delivered.  2. Postpartum anemia.   PROCEDURES:  Spontaneous vaginal delivery.   LABORATORY DATA:  Significant labs include a predelivery hemoglobin of 10.3,  a post delivery hemoglobin of 8.6.  RPR was nonreactive.   REASON FOR ADMISSION:  Briefly, the patient is a 30 year old gravida 3 para  1-0-1-1 who apparently was admitted when she was 3, 32, and 0 after she  changed over several hours of ambulation.  She progressed to 4 and then  stayed there for approximately 12 hours.  At that time the decision was made  to begin Pitocin.  Her bag of waters was broken.  She delivered a short time  afterwards.  She had a viable female infant.  Apgars were 9 and 10.  Postpartum she was transferred to the mother-baby floor where she did well.  She had minimal bleeding.  Her post hemoglobin was 8.6.  She was ambulating,  voiding, and tolerating a regular diet.  The infant was doing well and was  ready for discharge on postpartum day #2.   DISCHARGE DISPOSITION AND CONDITION:  The patient is discharged home in good  condition on July 05, 2003 on postpartum day #2.  Activity will be  pelvic rest for the next 6 weeks.  Diet will be regular.  Medications  include ibuprofen and Chromagen.  Follow-up will be in 6 weeks at [facility] for her postpartum check.  The patient did receive  Depo-Provera prior to discharge.        Shelbie Proctor. Shawnie Pons, M.D.    TSP/MEDQ  D:  07/05/2003  T:  07/05/2003  Job:  [id number]

## 2010-11-20 NOTE — Discharge Summary (Signed)
NAME:  [name], [name]                        ACCOUNT NO.:  [id number]   MEDICAL RECORD NO.:  [id number]                   PATIENT TYPE:  INP   LOCATION:  9308                                 [facility]   PHYSICIAN:  Conni Elliot, M.D.             DATE OF BIRTH:  April 20, 1981   DATE OF ADMISSION:  02/09/2003  DATE OF DISCHARGE:  02/10/2003                                 DISCHARGE SUMMARY   PRIMARY CARE PHYSICIAN:  [facility].   REFERRING PHYSICIAN:  None.   CONSULTING PHYSICIANS:  None.   FINAL DIAGNOSES:  1. Intrauterine pregnancy at 16 weeks.  2. Hyperemesis.  3. Dehydration.  4. Likely cervicitis.   PRINCIPAL PROCEDURES:  Rehydration.   ADMISSION HISTORY AND PHYSICIAN:  Please see chart.   LABORATORY DATA:  At the time of admission, CBC of 8.7, 12.1, 34.8, and  platelets of 252.  Chemistries:  Sodium 136, potassium 3.8, chloride 104,  CO2 23, glucose 68, BUN 5, creatinine 0.6, alkaline phosphatase 50, SGOT 12,  SGPT less than 19, total protein 17.1, albumin 3.6, calcium 9.3.  The urine  at the time of admission had greater than 80 ketones.  The urine at  discharge was negative for ketones.  The patient had a wet prep with no  yeast, no Trichomonas, and no clue cells, however, had too numerous to count  white blood cells and too numerous to count bacteria.   HOSPITAL COURSE:  [name] presented to the [facility] Emergency Room  at approximately 10:30 p.m. with a two-day history of nausea, vomiting, and  unable to keep anything down at all.  She denied fever, denied dysuria, and  complained of vague low abdominal and back pain with her vomiting.  She was  admitted and given 4 L of lactated Ringer's.  She continued to have ketones  in her urine after the 4 L of lactated Ringer's with Phenergan.  At that  point in time, her medication was switched from Phenergan to Zofran.  Her  last emesis was at 7 a.m. this morning.  She was  able to fully tolerate her  entire lunch and keep food and fluids down all day.  She continues to  complain of some lightheadedness, however, this is relieved if she gets up  slowly.  Orthostatic blood pressures were normal.  She has not been  tachycardic.  Her vital signs have otherwise been normal.  At the time of  discharge, her urine was ketone negative.  She was tolerating p.o. well.   INSTRUCTIONS TO PATIENT AND FAMILY:  Preterm labor instructions were  discussed.  She was instructed to return to the clinic on Monday for close  followup.  The patient is going to switch her prenatal care from a physician  in [place], [place], to the [facility] here.  She was  encouraged to attempt to drink continuously throughout  the day.    DISCHARGE MEDICATIONS:  Phenergan 12.5 mg one tablet p.o. q.6h. as needed  for nausea.   DO NOT RESUSCITATE STATUS:  Full code.     Penni Bombard, MD                          Conni Elliot, M.D.    SJ/MEDQ  D:  02/10/2003  T:  02/10/2003  Job:  [id number]   cc:   [facility]   [facility]

## 2010-11-20 NOTE — Discharge Summary (Signed)
[facility]  Patient:    [name], [name]                     MRN: [id number] Adm. Date:  [id number] Disc. Date: [id number] Attending:  Tammi Sou Dictator:   Turner Daniels, M.D. CC:         Ardean Larsen, M.D.   Discharge Summary  PROCEDURE:                    Obstetrical ultrasound which revealed a single fetus in breech presentation, placenta anterior grade 1, amniotic fluid within normal limits at approximately [redacted] weeks gestation, cervical length 3.3 cm, mean gestational age by current ultrasound 18 weeks 0 days with an [facility] of January 24, 2001 and this is within three days of the dates by the first ultrasound. There was also an echogenic intracardiac focus in the left ventricle.  The radiologist felt that most of the time this is an incidental finding, but can be associated with a chromosomal abnormality.  Correlation with the patients triple screen was recommended by the radiologist.  The remaining visualized fetal anatomy was unremarkable.  SUMMARY:                      This is a 30 year old G2, P0-0-1-0 at 33 and 4 weeks by a 6 week ultrasound that was seen by Dr. Ardean Larsen in clinic on February 18.  She was found to be losing a significant amount of weight.  When she began prenatal care on June 03, 2000 she weighed 126 pounds.  Patient did have some emesis, but that had resolved and she had been without emesis for greater than one month.  Her weight on the day of admission was 12.8 pounds.  She states that she was eating well, but after speaking with her mother she sleeps all day and then eats periodically throughout the night. She has occasional episodes where she feels dizzy.  PAST MEDICAL HISTORY:         1. Asthma.                               2. Depression.                               3. Poor social situation for which she is                                  currently doing community service after being                   arrested for shoplifting.  PAST OBSTETRICAL HISTORY:     Spontaneous abortion at [redacted] weeks gestation in 2000, history of chlamydia, history of urinary tract infection with this particular pregnancy, history of BV with this pregnancy.  SOCIAL HISTORY:               Patient dropped out of school in the 10th grade and is currently not working.  PHYSICAL EXAMINATION:         Unremarkable.  VITAL SIGNS:                  She was afebrile with orthostatics as follows: Lying down 108/56 with a pulse of 88, sitting up 102/60  with a pulse of 104, standing up 98/60 with a pulse of 116.                                Patient was admitted for hydration, laboratories to rule out organic causes, nutritional assessment, social work consult, and OB ultrasound.  LABORATORIES:                 UA:  Specific gravity 1.026, otherwise completely negative.  Culture negative.  TSH 0.671 within normal limits. Sodium 134, potassium 4.2, chloride 105, bicarbonate 23, BUN 6, creatinine 0.5, glucose 73, total bilirubin 0.3, alkaline phosphatase 62, SGOT 16, SGPT 3, total protein 7.4, albumin 3.6, calcium 9.2.  White blood cell count 9.3, hemoglobin 11.1, hematocrit 30.5, platelets 244,000.                                The patient was seen by a nutritionist and was found to be meeting 100% of her estimated needs when she ate proper meals. The patient was found to have a normal albumin and was felt that some of her orthostasis could be from dehydration.  Patient received IV fluids with good success.  Patient did well with dieting and received nutritional counseling from the nutritionist.  Social work case Production designer, theatre/television/film met with the patient and discussed her home situation and felt that she had a very supportive home life.  She is required to do 75 hours of community service which the patient has not started.  The recommendations are that she continue to take lots of fluids and eat small snacks to avoid  feeling light-headed and dizzy and to proceed with starting her community service as once she has the baby she will not be interested nor have the time in performing her community service duties.  Patient agreed that this was a good idea and she will proceed with starting her community service requirements.  If issues do arise she will address those with her primary physician.  On her second day of hospitalization patient appeared to be doing quite well and her weight was up to 116.75 pounds which is up from her initial weight of 112 on admission.  She had received her nutritional consult and social work consult.  She had also received her OB ultrasound and it was deemed appropriate that she could be discharged to home with followup with Dr. Lenard Galloway.  CONDITION ON DISCHARGE:       Good.  DISPOSITION:                  Discharged home with family.  DISCHARGE MEDICATIONS:        Prenatal vitamin daily.  INSTRUCTIONS:                 Patient was given specific activity and diet instructions per the nutritionists recommendations.  She was also given signs and symptoms to warrant further treatment.  FOLLOW-UP:                    Patient will follow-up with Dr. Lenard Galloway March 4 at 2:30 p.m.  Patient and her mother voiced agreement and understanding of the above discharge plans and had no further questions.DD:  08/24/00 TD:  08/24/00 Job: [id number]

## 2010-11-20 NOTE — Discharge Summary (Signed)
[facility]  Patient:    [name], [name] Visit Number: [id number] MRN: [id number]          Service Type: EMS Location: [facility] Attending Physician:  Corlis Leak. Dictated by:   Andrey Spearman, M.D. Admit Date:  01/24/2001 Discharge Date: 01/24/2001                             Discharge Summary  DISCHARGE DIAGNOSES:          1. Preterm contractions.                               2. Back pain.                               3. Depression.                               4. Intrauterine pregnancy at 32 weeks.  DISCHARGE MEDICATIONS:        1. Flexeril 10 mg p.o. q.8h. p.r.n.                               2. Tylenol 650 mg p.o. q.8h.                               3. Paxil 20 mg p.o. q.d.                               4. Vistaril as needed at h.s.  ADMISSION HISTORY AND PHYSICAL:                 The patient is a 30 year old G2, P0-0-1-0, who presented at 31-2/7 weeks with complaints of contractions and low back pain. She reported that she had mid back pain which had started Nov 24, 2000. She denied any dysuria or hematuria or any nausea or vomiting, but she did feel like she was having cold and chills, and she had a pressure sensation in the vaginal area. On admission, her exam was significant for cervical exam in which her cervix was 1 cm dilated, 25% effaced and high fetal station. Fetal heart rate was baseline of 140 with accelerations and no decelerations. She was having contractions every two to six minutes that were lasting 30 seconds and were very mild. Catheterized urinalysis initially looked pretty good except glucose of 250. Initial CBC showed a white count of 10.9 and hemoglobin of 8.3. She was admitted with presumed pyelonephritis.  HOSPITAL COURSE BY DISCHARGE DIAGNOSIS: #1 - PRETERM LABOR:  The patient was given terbutaline which caused fairly rapid cessation of her contractions. She was continued on p.o. terbutaline for the  first several days of the hospitalization, however, she did not continue to change her cervix and was refusing the terbutaline into the hospitalization, therefore, was thought that her contractions were probably not causing any active labor, and therefore, it was discontinued. She was discharged to home on preterm labor precautions.  #2 - LOW BACK PAIN:  This was initially thought to be secondary to pyelonephritis. She was started on IV antibiotics, however, urine culture was  negative so these were discontinued and her urine remained clear. She continued to have low back pain throughout the hospitalization which was actually thought to be musculoskeletal in nature; therefore, she was discharged home on Flexeril, Tylenol, and Vistaril as needed for sleep.  #3 - DEPRESSION:  This was evaluated during the hospitalization. She denied any suicidal ideation but she did seem depressed to the primary care team. Therefore, we talked with her about her depression and if she should have any thoughts of suicidal ideation, she should call her primary care doctor, myself, Dr. Lenard Galloway, immediately. In addition, I started her on Paxil 20 mg a day and warned her this would take four to six weeks to take effect and asked her to take it on a daily basis. She agreed. Her spirits seemed better upon discharge.  DISPOSITION:                  She was discharged home in stable condition to follow up as an outpatient. Dictated by:   Andrey Spearman, M.D. Attending Physician:  Corlis Leak DD:  03/09/01 TD:  03/09/01 Job: [id number]

## 2010-11-20 NOTE — Group Therapy Note (Signed)
[name], [name]               ACCOUNT NO.:  [id number]   MEDICAL RECORD NO.:  [id number]          PATIENT TYPE:  WOC   LOCATION:  [facility]                   FACILITY:  [facility]   PHYSICIAN:  Argentina Donovan, MD        DATE OF BIRTH:  11-07-1980   DATE OF SERVICE:  02/10/2006                                    CLINIC NOTE   The patient is a 30 year old gravida 3, para 1-1-1-2 who delivered two years  ago.  She apparently went into the emergency room because of right lower  quadrant pain at the beginning of the month.  They did an ultrasound of the  pelvis which was normal and all her tests were normal with the exception of  a bacterial vaginosis which was treated.  She has had somewhat irregular  periods since last April.  In April she had two colposcopies for reasons I  do not know except that there was an abnormal Pap smear, but they told her  just to follow up in 6 months so obviously it was not severely dysplastic.  She apparently was told by the emergency room that she was messed up from  the colposcopy, which is absolutely ridiculous.  She had normal periods up  until April and then missed several and had July period, has not had her  August period yet.  No history of recent trauma, change in jobs or change in  home environment.   EXAMINATION:  Her abdomen is soft, flat, tender around the McBurney's area  and just and just a little lower with no guarding or rebound and it seems to  be just superficial and muscular.  On pelvic examination the external  genitalia is normal, BUS within normal limits.  The vagina is clean and well  rugated.  The cervix is clean and parous.  The uterus is normal size, shape,  and consistency.  The adnexa is normal and once again the bimanual you can  push down abdominally and elicit the pain.  It seems to be very superficial  and muscular in origin.  I told her if a surgeon says that they cannot find  anything wrong and it persists we will do a  laparoscopy, although I think  she has nothing serious; she has had two normal pelvic ultrasounds.   IMPRESSION:  1. Neuromuscular discomfort.  2. Normal gynecologic examination.  3. Pap smear was repeated.           ______________________________  Argentina Donovan, MD     PR/MEDQ  D:  02/10/2006  T:  02/10/2006  Job:  [id number]

## 2010-11-20 NOTE — Op Note (Signed)
NAMEVANASSA, PENNIMAN               ACCOUNT NO.:  [id number]   MEDICAL RECORD NO.:  [id number]          PATIENT TYPE:  AMB   LOCATION:  SDC                           FACILITY:  [facility]   PHYSICIAN:  Phil D. [name], M.D.     DATE OF BIRTH:  [birth year]   DATE OF PROCEDURE:  07/18/2006  DATE OF DISCHARGE:                               OPERATIVE REPORT   SURGEON:  Phil D. [name], M.D.   ANESTHESIA:  General.   ESTIMATED BLOOD LOSS:  Minimal.   SPECIMENS TO PATHOLOGY:  None.   PROCEDURE:  Diagnostic laparoscopy.   PREOPERATIVE DIAGNOSIS:  Chronic pelvic right adnexal pain.   POSTOPERATIVE DIAGNOSIS:  Pelvic endometriosis.   OPERATIVE FINDINGS:  On examining the pelvis, the right ovary and left  ovary seemed completely within normal limits.  However, the right side  behind the broad ligament and on both uterosacral ligaments were  significant amount of endometriosis implants.  This seemed well below  the area of the patient's complaint which was just superior to the ovary  on that side.  However, no other signs of the endometriosis was seen,  none on the bladder side of the pelvis and none on the uterus with the  exception of the aforementioned.  Both tubes were completely normal and  I saw no bowel implants.  There were no sign of any significant  adhesions in the pelvis.   DESCRIPTION OF PROCEDURE:  Under satisfactory general anesthesia, the  patient in dorsal semilithotomy position, perineum, vagina and abdomen  were prepped and draped in usual sterile manner.  Bimanual pelvic  examination under anesthesia revealed a uterus anterior position, freely  movable with normal free adnexa.  Weighted speculum was placed in the  posterior portion of the vagina.  Anterior lip of the cervix grasped  with single-tooth tenaculum.  Acorn cannula placed in the cervix for  manipulation of the uterus and the speculum removed.  Veress needle  inserted in the lower pole of the umbilicus after a vertical  umbilical  incision of 1 cm was made,  3 liters of carbon dioxide was slowly  insufflated in the peritoneal cavity.  Equal tympany occurred over the  entire abdominal wall.  Veress needle was removed, laparoscopic trocar  inserted into the peritoneal cavity.  Trocar removed from the sleeve.  Laparoscope inserted and the findings were as above.  The scope was  removed.  There was no sign of injury to any other organ.  As much CO2  as possible expressed through the sleeve.  The sleeve removed.  The  incision closed with 3-0 Vicryl suture closing the fascia beneath the  incision and then running up to a subcutaneous closure.  Dry sterile  dressing was applied.  The patient tolerated the procedure well and was  transferred to the recovery room in satisfactory condition.   PLAN:  I expect to see this patient back in the office in 2 weeks at  which time I will start treating her informing her of the side effects,  etc., of  Depo Lupron.  I plan on keeping her  on this for 6 months.  She  is planning on trying to get pregnant and I feel if she does get  pregnant after that, within  a short time that should further suppress  the endometriosis without need for cauterization of the implants which  may lead to further scarring.           ______________________________  Javier Glazier. [name], M.D.     PDR/MEDQ  D:  07/18/2006  T:  07/18/2006  Job:  [id number]

## 2010-11-26 ENCOUNTER — Inpatient Hospital Stay (HOSPITAL_COMMUNITY)
Admission: EM | Admit: 2010-11-26 | Discharge: 2010-12-02 | DRG: 440 | Disposition: A | Discharge status: Home / Self Care | Payer: Medicaid Other | Attending: Internal Medicine | Admitting: Internal Medicine

## 2010-11-26 DIAGNOSIS — E669 Obesity, unspecified: Secondary | ICD-10-CM | POA: Diagnosis present

## 2010-11-26 DIAGNOSIS — Z765 Malingerer [conscious simulation]: Secondary | ICD-10-CM

## 2010-11-26 DIAGNOSIS — K861 Other chronic pancreatitis: Secondary | ICD-10-CM | POA: Diagnosis present

## 2010-11-26 DIAGNOSIS — J45909 Unspecified asthma, uncomplicated: Secondary | ICD-10-CM | POA: Diagnosis present

## 2010-11-26 DIAGNOSIS — E876 Hypokalemia: Secondary | ICD-10-CM | POA: Diagnosis not present

## 2010-11-26 DIAGNOSIS — K859 Acute pancreatitis without necrosis or infection, unspecified: Principal | ICD-10-CM | POA: Diagnosis present

## 2010-11-26 DIAGNOSIS — F1910 Other psychoactive substance abuse, uncomplicated: Secondary | ICD-10-CM | POA: Diagnosis present

## 2010-11-26 LAB — COMPREHENSIVE METABOLIC PANEL
ALT: 10 U/L (ref 0–35)
Alkaline Phosphatase: 56 U/L (ref 39–117)
BUN: 10 mg/dL (ref 6–23)
CO2: 27 mEq/L (ref 19–32)
Calcium: 9.3 mg/dL (ref 8.4–10.5)
GFR calc non Af Amer: >60 mL/min (ref >60)
Glucose, Bld: 85 mg/dL (ref 70–99)
Total Protein: 7.6 g/dL (ref 6.0–8.3)

## 2010-11-26 LAB — URINALYSIS, ROUTINE W REFLEX MICROSCOPIC
Glucose, UA: NEGATIVE mg/dL
Leukocytes, UA: NEGATIVE
Nitrite: NEGATIVE
Specific Gravity, Urine: 1.026 (ref 1.005–1.030)
pH: 5.5 (ref 5.0–8.0)

## 2010-11-26 LAB — LIPASE, BLOOD: Lipase: 62 U/L — ABNORMAL HIGH (ref 11–59)

## 2010-11-26 LAB — DIFFERENTIAL
Basophils Absolute: 0.1 10*3/uL (ref 0.0–0.1)
Lymphocytes Relative: 35 % (ref 12–46)
Monocytes Absolute: 0.5 10*3/uL (ref 0.1–1.0)
Neutro Abs: 4 10*3/uL (ref 1.7–7.7)
Neutrophils Relative %: 53 % (ref 43–77)

## 2010-11-26 LAB — URINE MICROSCOPIC-ADD ON

## 2010-11-26 LAB — POCT PREGNANCY, URINE: Preg Test, Ur: NEGATIVE

## 2010-11-26 LAB — CBC
HCT: 37.5 % (ref 36.0–46.0)
Hemoglobin: 12.7 g/dL (ref 12.0–15.0)
RBC: 4.7 MIL/uL (ref 3.87–5.11)
WBC: 7.5 10*3/uL (ref 4.0–10.5)

## 2010-11-27 LAB — BASIC METABOLIC PANEL
BUN: 8 mg/dL (ref 6–23)
Chloride: 106 mEq/L (ref 96–112)
Creatinine, Ser: 0.58 mg/dL (ref 0.4–1.2)
GFR calc Af Amer: >60 mL/min (ref >60)
GFR calc non Af Amer: >60 mL/min (ref >60)
Potassium: 3.9 mEq/L (ref 3.5–5.1)

## 2010-11-27 LAB — SURGICAL PCR SCREEN: Staphylococcus aureus: POSITIVE — AB

## 2010-11-27 LAB — LIPASE, BLOOD: Lipase: 1278 U/L — ABNORMAL HIGH (ref 11–59)

## 2010-11-27 NOTE — H&P (Signed)
NAMEANNELIE, [name]               ACCOUNT NO.:  [id number]  MEDICAL RECORD NO.:  [id number]           PATIENT TYPE:  E  LOCATION:  [facility]                         FACILITY:  [facility]  PHYSICIAN:  Homero Fellers, MD   DATE OF BIRTH:  [birth year]  DATE OF ADMISSION:  11/26/2010 DATE OF DISCHARGE:                             HISTORY & PHYSICAL   PRIMARY CARE PHYSICIAN:  __________  CHIEF COMPLAINT:  Abdominal pain.  HISTORY OF PRESENT ILLNESS:  This is a 30 year old woman with history of chronic pancreatitis with repeated flares of abdominal pain who presented with abdominal pain which has been going on for the past 4-5 weeks but got worse in the past few days.  She describes epigastric pain radiating to the back accompanied with vomiting.  She says she is unable to keep food down for the past 1-2 days.  She denied any diarrhea, urinary symptoms, fever, headache, or dizziness.  A lipase in the emergency room was 62, but review of old record showed that she has had normal or mildly elevated lipase each time she presented with abdominal pain and flare of pancreatitis.  She has also had extensive workup for pancreatitis including ERCP, endoscopic ultrasound, and follows with Dr. Con Memos from [facility].  The patient however has not followed up with this doctor for a while because of transportation issues.  Based on the workup she has had in the past, it was noted that she has evidence of chronic hepatitis on endoscopic ultrasound.  ERCP showed normal pancreatic duct and has no evidence of stone in the common bile duct on MRCP.  She has also had diagnostic laparoscopy in 2010 which showed some adhesions that were lysed.  She also had celiac plexus block for pain relief by Dr. Dulce Sellar about a year and half ago.  All these measures has not improved her pain.  I attempted to discharge the patient from the emergency room with pain medicine and antiemesis, but she refused saying she does not  feel comfortable going home because in the past there has been worsening of clinical condition when she was discharged home prematurely.  PAST MEDICAL HISTORY:  Again include history of chronic pancreatitis, history of asthma, recurrent chronic abdominal pain, obesity, history of fibroids, and questionable pain seeking tendencies in the past.  MEDICATION:  None listed.  ALLERGIES:  ASPIRIN, DEMEROL, IBUPROFEN, MORPHINE, TORADOL, __________ , PROTONIX.  SOCIAL HISTORY:  No smoking, alcohol, or drug use at this time.  She has lives with her 2 sons.  FAMILY HISTORY:  Positive for diabetes in father and high blood pressure.  REVIEW OF SYSTEMS:  Ten-point review of systems negative except as described above.  SKIN: She has 2 or 3 insect bite lesions on the left forearm which according to the patient is quite itchy.  PHYSICAL EXAMINATION:  VITAL SIGNS:  Blood pressure is 111/76, pulse 81, respirations 16, and temperature is 98.3. GENERAL:  The patient looks comfortable appeared to be in no distress. HEENT:  Pallor.  Extraocular movements are intact. NECK:  Supple.  No JVD, adenopathy, or thyromegaly. LUNGS:  Clear bilaterally to auscultation.  No  wheezing or crackles. HEART:  S1 and S2.  No murmurs, rubs, or gallops. ABDOMEN:  Full, soft, vague epigastric tenderness as well as some tenderness in the right lower quadrant.  Bowel sounds present.  No masses. EXTREMITIES:  No edema, clubbing, or cyanosis. MUSCULOSKELETAL:  Negative for myalgias.  No joint swelling or tenderness. PSYCHIATRIC:  Normal mood and affect. LYMPHATICS:  No lymph gland enlargement in the neck.  LABORATORY DATA:  Lipase 62.  Urinalysis normal.  Sodium 139, potassium 3.9, BUN 10, creatinine 0.58.  Liver enzymes are normal.  White count 7.5, hemoglobin 3.7, platelet count 284.  No imaging studies were done as several studies done in the past has been normal.  ASSESSMENT:  A 30 year old woman admitted with  abdominal pain, nausea and vomiting which might be a flare of her chronic pancreatitis. 1. The patient has known history of chronic pancreatitis. 2. History of asthma with no evidence of exacerbation at this time. 3. Questionable pain seeking tendencies.  PLAN:  Admit to medical floor for observation, give gentle IV fluids, pain control, antiemesis.  I will also give her some Benadryl for itching from a recent insect bite.     Homero Fellers, MD     FA/MEDQ  D:  11/26/2010  T:  11/26/2010  Job:  [id number]  Electronically Signed by Homero Fellers  on 11/27/2010 03:30:48 AM

## 2010-11-28 LAB — CBC
MCV: 81.7 fL (ref 78.0–100.0)
Platelets: 275 10*3/uL (ref 150–400)
RDW: 12.3 % (ref 11.5–15.5)
WBC: 13.1 10*3/uL — ABNORMAL HIGH (ref 4.0–10.5)

## 2010-11-28 LAB — BASIC METABOLIC PANEL
BUN: 6 mg/dL (ref 6–23)
Creatinine, Ser: 0.73 mg/dL (ref 0.4–1.2)
GFR calc Af Amer: >60 mL/min (ref >60)
GFR calc non Af Amer: >60 mL/min (ref >60)
Potassium: 4.4 mEq/L (ref 3.5–5.1)

## 2010-11-29 LAB — CBC
HCT: 33.8 % — ABNORMAL LOW (ref 36.0–46.0)
Platelets: 245 10*3/uL (ref 150–400)
RBC: 4.13 MIL/uL (ref 3.87–5.11)
RDW: 12.3 % (ref 11.5–15.5)
WBC: 9.1 10*3/uL (ref 4.0–10.5)

## 2010-11-30 LAB — CBC
Hemoglobin: 11.9 g/dL — ABNORMAL LOW (ref 12.0–15.0)
Platelets: 267 10*3/uL (ref 150–400)
RBC: 4.34 MIL/uL (ref 3.87–5.11)
RDW: 12.1 % (ref 11.5–15.5)

## 2010-11-30 LAB — BASIC METABOLIC PANEL
BUN: 6 mg/dL (ref 6–23)
Calcium: 8.5 mg/dL (ref 8.4–10.5)
Creatinine, Ser: 0.76 mg/dL (ref 0.4–1.2)
GFR calc non Af Amer: >60 mL/min (ref >60)
Glucose, Bld: 118 mg/dL — ABNORMAL HIGH (ref 70–99)
Potassium: 3.3 mEq/L — ABNORMAL LOW (ref 3.5–5.1)

## 2010-11-30 LAB — MAGNESIUM: Magnesium: 2 mg/dL (ref 1.5–2.5)

## 2010-12-03 NOTE — Discharge Summary (Signed)
  [name], MAS               ACCOUNT NO.:  [id number]  MEDICAL RECORD NO.:  [id number]           PATIENT TYPE:  I  LOCATION:  5006                         FACILITY:  [facility]  PHYSICIAN:  Peggye Pitt, M.D. DATE OF BIRTH:  1981/04/20  DATE OF ADMISSION:  11/26/2010 DATE OF DISCHARGE:  12/02/2010                              DISCHARGE SUMMARY   PRIMARY CARE PHYSICIAN:  Dr. Daphine Deutscher at [facility].  DISCHARGE DIAGNOSES: 1. Recurrent, acute pancreatitis. 2. Hypokalemia, repleted. 3. Questionable drug-seeking behavior.  DISCHARGE MEDICATIONS:  Include 1. Multivitamin 1 tablet daily. 2. Estradiol 2 mg daily. 3. Oxycodone 5-10 mg every 4 hours as needed for pain.  I have     dispensed 45 tablets. 4. Benadryl 25 mg to take half a tablet every 4 hours as needed for     itching.  CONSULTATION THIS HOSPITALIZATION:  None.  IMAGES AND PROCEDURES:  None.  DISPOSITION AND FOLLOWUP:  [name] will be discharged home today in stable and improved condition.  She is to follow up with her PCP in approximately 3-4 weeks.  She also already has a scheduled appointment with her pancreas GI specialist at [facility] for mid July.  HISTORY AND PHYSICAL:  For complete details, please refer to dictation on Nov 27, 2010, by Dr. Phineas Douglas, but in brief, [name] is a 30 year old African American young lady with a history of recurrent acute pancreatitis, who presented to the hospital with abdominal pain for the past 4 or 5 days prior to admission.  According to her, she was unable to keep any food down.  She had a lipase of 1200 and because of this we are asked to assist for further evaluation and management.  HOSPITAL COURSE BY PROBLEM: 1. Recurrent acute pancreatitis.  The etiology of her pancreatitis     remains a mystery.  She has already had a cholecystectomy.  She is     not an alcohol drinker.  She in fact has a pancreas specialist over     at [facility].  She is scheduled  to see him in July.  At this     point, she is stable.  She is tolerating a solid diet, still has     some minor abdominal pain, but is well controlled on oral     oxycodone, so we will discharge her home today. 2. Hypokalemia.  At one point she was noted to have a potassium of     3.3.  This was repleted.  She had a normal magnesium level.  VITALS ON DAY OF DISCHARGE:  Blood pressure 134/90, heart rate 101, respirations 20, sats of 95% on room air and a temperature of 98.3.     Peggye Pitt, M.D.     EH/MEDQ  D:  12/02/2010  T:  12/02/2010  Job:  [id number]  cc:   Dr. Daphine Deutscher  Electronically Signed by Peggye Pitt M.D. on 12/03/2010 07:49:25 AM

## 2010-12-10 ENCOUNTER — Inpatient Hospital Stay (HOSPITAL_COMMUNITY)
Admission: EM | Admit: 2010-12-10 | Discharge: 2010-12-16 | DRG: 440 | Disposition: A | Discharge status: Home / Self Care | Payer: Medicaid Other | Attending: Internal Medicine | Admitting: Internal Medicine

## 2010-12-10 DIAGNOSIS — E876 Hypokalemia: Secondary | ICD-10-CM | POA: Diagnosis not present

## 2010-12-10 DIAGNOSIS — F1910 Other psychoactive substance abuse, uncomplicated: Secondary | ICD-10-CM | POA: Diagnosis present

## 2010-12-10 DIAGNOSIS — Z79899 Other long term (current) drug therapy: Secondary | ICD-10-CM

## 2010-12-10 DIAGNOSIS — K861 Other chronic pancreatitis: Principal | ICD-10-CM | POA: Diagnosis present

## 2010-12-10 DIAGNOSIS — Z765 Malingerer [conscious simulation]: Secondary | ICD-10-CM

## 2010-12-10 DIAGNOSIS — N760 Acute vaginitis: Secondary | ICD-10-CM | POA: Diagnosis not present

## 2010-12-11 ENCOUNTER — Emergency Department (HOSPITAL_COMMUNITY): Payer: Medicaid Other

## 2010-12-11 LAB — DIFFERENTIAL
Basophils Absolute: 0 10*3/uL (ref 0.0–0.1)
Basophils Relative: 0 % (ref 0–1)
Eosinophils Relative: 2 % (ref 0–5)
Monocytes Absolute: 0.9 10*3/uL (ref 0.1–1.0)
Monocytes Relative: 10 % (ref 3–12)

## 2010-12-11 LAB — URINALYSIS, ROUTINE W REFLEX MICROSCOPIC
Glucose, UA: NEGATIVE mg/dL
Hgb urine dipstick: NEGATIVE
Ketones, ur: NEGATIVE mg/dL
Protein, ur: NEGATIVE mg/dL
Urobilinogen, UA: 0.2 mg/dL (ref 0.0–1.0)

## 2010-12-11 LAB — LIPID PANEL
Cholesterol: 193 mg/dL (ref 0–200)
HDL: 44 mg/dL (ref >39)
Triglycerides: 120 mg/dL (ref <150)

## 2010-12-11 LAB — CBC
Hemoglobin: 13.4 g/dL (ref 12.0–15.0)
MCH: 27.5 pg (ref 26.0–34.0)
MCHC: 34.1 g/dL (ref 30.0–36.0)
RDW: 12.2 % (ref 11.5–15.5)

## 2010-12-11 LAB — COMPREHENSIVE METABOLIC PANEL
BUN: 11 mg/dL (ref 6–23)
CO2: 25 mEq/L (ref 19–32)
Calcium: 8.9 mg/dL (ref 8.4–10.5)
Chloride: 105 mEq/L (ref 96–112)
Creatinine, Ser: 0.6 mg/dL (ref 0.4–1.2)
GFR calc Af Amer: >60 mL/min (ref >60)
GFR calc non Af Amer: >60 mL/min (ref >60)
Total Bilirubin: 0.2 mg/dL — ABNORMAL LOW (ref 0.3–1.2)

## 2010-12-12 LAB — COMPREHENSIVE METABOLIC PANEL
ALT: 194 U/L — ABNORMAL HIGH (ref 0–35)
BUN: 3 mg/dL — ABNORMAL LOW (ref 6–23)
CO2: 28 mEq/L (ref 19–32)
Calcium: 8.5 mg/dL (ref 8.4–10.5)
Creatinine, Ser: 0.55 mg/dL (ref 0.4–1.2)
GFR calc Af Amer: >60 mL/min (ref >60)
GFR calc non Af Amer: >60 mL/min (ref >60)
Glucose, Bld: 78 mg/dL (ref 70–99)
Total Protein: 7.2 g/dL (ref 6.0–8.3)

## 2010-12-12 LAB — CBC
HCT: 37.6 % (ref 36.0–46.0)
Hemoglobin: 12.4 g/dL (ref 12.0–15.0)
MCH: 27.1 pg (ref 26.0–34.0)
MCHC: 33 g/dL (ref 30.0–36.0)
MCV: 82.1 fL (ref 78.0–100.0)
RBC: 4.58 MIL/uL (ref 3.87–5.11)

## 2010-12-12 LAB — LIPASE, BLOOD: Lipase: 222 U/L — ABNORMAL HIGH (ref 11–59)

## 2010-12-13 ENCOUNTER — Inpatient Hospital Stay (HOSPITAL_COMMUNITY): Payer: Medicaid Other

## 2010-12-13 LAB — COMPREHENSIVE METABOLIC PANEL
ALT: 109 U/L — ABNORMAL HIGH (ref 0–35)
Albumin: 3.3 g/dL — ABNORMAL LOW (ref 3.5–5.2)
Calcium: 8.5 mg/dL (ref 8.4–10.5)
GFR calc Af Amer: >60 mL/min (ref >60)
Glucose, Bld: 84 mg/dL (ref 70–99)
Sodium: 138 mEq/L (ref 135–145)
Total Protein: 6.9 g/dL (ref 6.0–8.3)

## 2010-12-13 LAB — LIPASE, BLOOD: Lipase: 58 U/L (ref 11–59)

## 2010-12-13 MED ORDER — GADOBENATE DIMEGLUMINE 529 MG/ML IV SOLN: 15.0000 mL | Freq: Once | INTRAVENOUS | Status: AC | PRN

## 2010-12-13 MED ORDER — GADOBENATE DIMEGLUMINE 529 MG/ML IV SOLN
15.0000 mL | Freq: Once | INTRAVENOUS | Status: AC | PRN
  Administered 2010-12-13: 15 mL via INTRAVENOUS

## 2010-12-14 LAB — MAGNESIUM: Magnesium: 1.9 mg/dL (ref 1.5–2.5)

## 2010-12-14 LAB — DIFFERENTIAL
Basophils Absolute: 0 10*3/uL (ref 0.0–0.1)
Basophils Relative: 0 % (ref 0–1)
Eosinophils Absolute: 0.6 10*3/uL (ref 0.0–0.7)
Eosinophils Relative: 8 % — ABNORMAL HIGH (ref 0–5)
Lymphocytes Relative: 38 % (ref 12–46)
Monocytes Absolute: 0.5 10*3/uL (ref 0.1–1.0)

## 2010-12-14 LAB — CBC
HCT: 36.4 % (ref 36.0–46.0)
MCHC: 33.5 g/dL (ref 30.0–36.0)
Platelets: 307 10*3/uL (ref 150–400)
RDW: 11.9 % (ref 11.5–15.5)

## 2010-12-14 LAB — COMPREHENSIVE METABOLIC PANEL
AST: 36 U/L (ref 0–37)
Albumin: 3.2 g/dL — ABNORMAL LOW (ref 3.5–5.2)
Alkaline Phosphatase: 102 U/L (ref 39–117)
BUN: 5 mg/dL — ABNORMAL LOW (ref 6–23)
Potassium: 3.3 mEq/L — ABNORMAL LOW (ref 3.5–5.1)
Total Protein: 6.7 g/dL (ref 6.0–8.3)

## 2010-12-14 LAB — IGG 1, 2, 3, AND 4
IgG Subclass 1: 759 mg/dL (ref 405.0–1011.0)
IgG Subclass 2: 334 mg/dL (ref 169.0–786.0)
IgG Total IGGSUB: 1230 mg/dL (ref 700–1600)

## 2010-12-14 LAB — LIPASE, BLOOD: Lipase: 38 U/L (ref 11–59)

## 2010-12-15 LAB — COMPREHENSIVE METABOLIC PANEL
CO2: 29 mEq/L (ref 19–32)
Calcium: 9.1 mg/dL (ref 8.4–10.5)
Creatinine, Ser: 0.67 mg/dL (ref 0.4–1.2)
GFR calc Af Amer: >60 mL/min (ref >60)
GFR calc non Af Amer: >60 mL/min (ref >60)
Glucose, Bld: 72 mg/dL (ref 70–99)

## 2010-12-15 MED ORDER — GADOBENATE DIMEGLUMINE 529 MG/ML IV SOLN
15.0000 mL | Freq: Once | INTRAVENOUS | Status: AC
  Administered 2010-12-13: 15 mL via INTRAVENOUS

## 2010-12-28 NOTE — H&P (Signed)
[name], [name]               ACCOUNT NO.:  [id number]  MEDICAL RECORD NO.:  [id number]  LOCATION:  5031                         FACILITY:  [facility]  PHYSICIAN:  Eduard Clos, MDDATE OF BIRTH:  03-Sep-1980  DATE OF ADMISSION:  12/10/2010 DATE OF DISCHARGE:                             HISTORY & PHYSICAL   PRIMARY CARE PHYSICIAN:  At [facility].  CHIEF COMPLAINT:  Abdominal pain.  HISTORY OF PRESENT ILLNESS:  A 30 year old female history of recurrent pancreatitis presenting with a complaint of abdominal pain, which was worsening over the last 3-4 days.  The pain is more on the epigastric area radiating to back.  It becomes more constant with nausea and vomiting.  Denies any diarrhea.  The patient was found to have mildly elevated lipase.  The patient has been admitted for pain relief.  The patient denies any chest pain, shortness of breath.  Denies any fever or chills.  Denies any dysuria, discharge, or diarrhea.  Denies any headache or visual symptoms or any focal deficit.  PAST MEDICAL HISTORY:  History of recurrent pancreatitis, but cause is not known exactly, history of asthma in childhood.  PAST SURGICAL HISTORY:  Hysterectomy and cholecystectomy.  MEDICATIONS ON ADMISSION:  The patient was discharged on Dec 02, 2010, a week ago with multivitamin, estradiol, oxycodone, and Bentyl.  ALLERGIES:  IBUPROFEN, PROPOXYPHENE, TRAMADOL, MEPERIDINE, MORPHINE SULFATE, ASPIRIN, PROTONIX, KETOROLAC, and ACETAMINOPHEN.  SOCIAL HISTORY:  The patient does not smoke cigarette, drink alcohol, or use illegal drugs.  FAMILY HISTORY:  Positive for diabetes and high blood pressure.  REVIEW OF SYSTEMS:  As per the history of present illness.  Nothing else significant.  PHYSICAL EXAMINATION:  GENERAL:  The patient was examined at bedside, not in acute distress. VITAL SIGNS:  Blood pressure is 130/85, pulse is 100 per minute, temperature 99.0, respirations 18, O2 sats is  100%. HEENT:  Anicteric.  No pallor.  No discharge from ears, eyes, nose, or mouth. CHEST:  Bilateral air entry present.  No rhonchi or crepitation. ABDOMEN:  Soft but tender in the epigastric area.  No guarding or rigidity. CNS:  Alert and oriented to time, place, and person.  Moves upper and lower extremities 5/5. EXTREMITIES:  Peripheral pulses good.  No edema.  LABORATORY DATA:  Acute abdominal series shows no evidence of active pulmonary disease, nonobstructive bowel gas pattern.  CBC:  WBC is 9.8, hemoglobin is 13.4, hematocrit is 39.3, platelets 327.  Complete metabolic panel:  Sodium 139, potassium 3.5, chloride 105, carbon dioxide 75, glucose 97, BUN 11, creatinine 0.6.  Alk phos 86, AST 48, ALT 21, total protein 8.0, albumin 4, calcium 8.9.  Lipase 128. Pregnancy screen is negative.  UA is negative for nitrites and leukocytes.  ASSESSMENT: 1. Abdominal pain with history of recurrent pancreatitis.  Plan is to     admit the patient to medical floor. 2. For her pancreatitis, we will place the patient on clear liquid diet, advance slowly as tolerated, place the patient on p.r.n. pain     relief medication.  We need to verify home medication and further     recommendation as condition evolves.     Eduard Clos, MD  ANK/MEDQ  D:  12/11/2010  T:  12/11/2010  Job:  [id number]  Electronically Signed by Midge Minium MD on 12/28/2010 07:31:24 AM

## 2011-01-02 ENCOUNTER — Emergency Department (HOSPITAL_COMMUNITY)
Admission: EM | Admit: 2011-01-02 | Discharge: 2011-01-03 | Disposition: A | Discharge status: Home / Self Care | Payer: Medicaid Other | Attending: Emergency Medicine | Admitting: Emergency Medicine

## 2011-01-02 DIAGNOSIS — J45909 Unspecified asthma, uncomplicated: Secondary | ICD-10-CM | POA: Insufficient documentation

## 2011-01-02 DIAGNOSIS — R10816 Epigastric abdominal tenderness: Secondary | ICD-10-CM | POA: Insufficient documentation

## 2011-01-02 DIAGNOSIS — R1013 Epigastric pain: Secondary | ICD-10-CM | POA: Insufficient documentation

## 2011-01-02 DIAGNOSIS — Z9889 Other specified postprocedural states: Secondary | ICD-10-CM | POA: Insufficient documentation

## 2011-01-02 DIAGNOSIS — R110 Nausea: Secondary | ICD-10-CM | POA: Insufficient documentation

## 2011-01-02 DIAGNOSIS — K861 Other chronic pancreatitis: Secondary | ICD-10-CM | POA: Insufficient documentation

## 2011-01-02 DIAGNOSIS — R197 Diarrhea, unspecified: Secondary | ICD-10-CM | POA: Insufficient documentation

## 2011-01-02 LAB — URINALYSIS, ROUTINE W REFLEX MICROSCOPIC
Ketones, ur: NEGATIVE mg/dL
Leukocytes, UA: NEGATIVE
Nitrite: NEGATIVE
Protein, ur: NEGATIVE mg/dL

## 2011-01-02 LAB — POCT I-STAT, CHEM 8
BUN: 15 mg/dL (ref 6–23)
Calcium, Ion: 1.13 mmol/L (ref 1.12–1.32)
HCT: 37 % (ref 36.0–46.0)
TCO2: 22 mmol/L (ref 0–100)

## 2011-01-02 LAB — HEPATIC FUNCTION PANEL
AST: 13 U/L (ref 0–37)
Bilirubin, Direct: 0.1 mg/dL (ref 0.0–0.3)

## 2011-01-02 LAB — LIPASE, BLOOD: Lipase: 75 U/L — ABNORMAL HIGH (ref 11–59)

## 2011-01-02 LAB — CBC
MCV: 79.6 fL (ref 78.0–100.0)
Platelets: 281 10*3/uL (ref 150–400)
RDW: 12.5 % (ref 11.5–15.5)
WBC: 9.5 10*3/uL (ref 4.0–10.5)

## 2011-01-02 LAB — POCT PREGNANCY, URINE: Preg Test, Ur: NEGATIVE

## 2011-01-04 NOTE — Discharge Summary (Signed)
[name], [name]               ACCOUNT NO.:  [id number]  MEDICAL RECORD NO.:  [id number]  LOCATION:  5031                         FACILITY:  [facility]  PHYSICIAN:  Marcellus Scott, MD     DATE OF BIRTH:  1980-11-08  DATE OF ADMISSION:  12/10/2010 DATE OF DISCHARGE:  12/16/2010                              DISCHARGE SUMMARY   PRIMARY CARE PHYSICIAN:  At [facility].  DISCHARGE DIAGNOSES: 1. Recurrent or chronic pancreatitis. 2. Chronic abdominal pain. 3. Question opioid medication seeking behavior. 4. History of asthma, stable. 5. History of uterine fibroids. 6. Status post cholecystectomy.  DISCHARGE MEDICATIONS: 1. Oxycodone 5 mg p.o. q.6 h. p.r.n. for pain, 20 tablets prescribed. 2. Benadryl 12.5 mg p.o. q.4 h. p.r.n. for allergies. 3. Estradiol 2 mg p.o. daily.  IMAGING: 1. MRCP on December 13, 2010.  Impression:  No evidence of intrahepatic or     extrahepatic biliary dilatation.  No common bile duct filling     defects identified.  The radiologist could not identify a     connection between the ventral and dorsal pancreatic ducts and was     suspicious for pancreatic divisum.  Abdominal x-ray on December 11, 2010, shows nonobstructive bowel gas pattern. 2. Chest x-ray on December 11, 2010, showed no active cardiopulmonary     disease.  LABORATORY DATA:  Magnesium 2.1.  Comprehensive metabolic panel within normal limits except for ALT 54, albumin 3.3.  Immunoglobulins, IgG4 levels were normal. Lipase on December 14, 2010, was 39.  CBC was within normal limits.  TSH was 2.783.  Lipid panel only significant for LDL 125.  Urinalysis did not show any features of urinary tract infection.  Urine pregnancy test was negative.  Lipase on admission was 128.  CONSULTATIONS:  Gastroenterology, Dr. Dorena Cookey and Dr. Herbert Moors.  DIET:  Soft diet.  ACTIVITIES:  Ad lib.  COMPLAINT:  Reduced abdominal pain and the patient is able to tolerate diet.  She has had a BM day before  yesterday.  PHYSICAL EXAMINATION:  GENERAL:  The patient is in no obvious distress. VITAL SIGNS:  Temperature 97.5 degrees Fahrenheit, pulse 101 per minute and regular, respirations 18 per minute, blood pressure 119/81 mmHg, and saturating at 97% on room air. RESPIRATORY:  Clear. CARDIOVASCULAR:  First and second heart sounds heard, regular. ABDOMEN:  Nondistended.  Nonconsistent fluctuating epigastric tenderness but otherwise abdomen soft.  No rigidity, guarding, or rebound.  Bowel sounds normally heard. CENTRAL NERVOUS SYSTEM:  The patient is awake, alert, oriented x3 with no focal neurological deficits. EXTREMITIES:  With grade 5/5 power.  [name] is a 30 year old African American female patient with a history of chronic or recurrent pancreatitis, status post cholecystectomy, lysis of adhesions in 2010, endoscopic ultrasound and celiac plexus block by the gastroenterologist, unsuccessful attempt at ERCP by the gastroenterologist who also noted a normal pancreatic duct, two negative MRCPs.  Per the gastroenterologist on the endoscopic ultrasound, there was evidence of chronic pancreatitis.  The patient has been referred to Dr. Ivan Anchors. at [facility] for advanced pancreatic or biliary diagnostics and treatment and she has the appointment for February 27, 2011.  However, she has multiple frequent admissions to the hospital with abdominal pain symptoms.  She was just discharged from the hospital on Dec 02, 2010, and now returns with abdominal pain.  1. Chronic or recurrent pancreatitis.  The patient was admitted to the     hospital.  Her bowels were rested.  She was placed on pain     medications, IV fluids, and Gastroenterology was consulted.  They     continued to see her in the hospital and this dictator discussed     with Dr. Evette Cristal too.  They indicate that they do not have the     capability to evaluate for sphincter of Oddi dysfunction in      [place] and they recommend input from the pancreatic or biliary     experts at the [facility].  Also, it is extremely     difficult to evaluate objectively the degree of pain.  For the 3     days that this dictator has seen the patient, the patient has not     looked in pain.  She looks quite comfortable.  She seems to have     eaten a hamburger.  On one occasion, she had pizza with pepperoni     by her bedside.  According to multiple nurses' report, she has not     appeared in pain but requests her pain medications on the clock     every 2 hours and also suggests to the nursing the way the IV     Dilaudid is to be administered.  She has documented reports in the     E-chart suggesting that she has history of pain medication seeking     behavior.  At this time, the patient indicates that her pain is     better and that she can manage it at home by her oral pain     medications.  She says she has only 4 tablets of the oxycodone left     with her at home.  She also has been advised that she is probably     best served by seeing a pain specialist as an outpatient and the     appointment can be arranged through her primary care physician.  At     this time, we are trying to expedite the appointment with her     primary care physician as well as the GI specialist at [facility]. 2. Hypokalemia, repleted. 3. Chronic abdominal pain.  Management as per problem #1.  DISPOSITION:  The patient is discharged home in stable condition.  FOLLOWUP RECOMMENDATIONS: 1. Follow up with Dr. Yisroel Ramming at [facility] on December 30, 2010, at 2:15 p.m. 2. With Dr. Ivan Anchors. at [facility].  The     patient has an appointment on February 27, 2011, but we are trying to     arrange for an earlier appointment.  Time taken in coordinating this discharge was 30 minutes.     Marcellus Scott, MD     AH/MEDQ  D:  12/16/2010  T:   12/16/2010  Job:  [id number]  cc:   Tresa Endo L. Philipp Deputy, M.D. John C. Madilyn Fireman, M.D. Achilles Dunk, MD  Electronically Signed by Marcellus Scott MD on 01/04/2011 10:52:33 PM

## 2011-01-05 ENCOUNTER — Emergency Department (HOSPITAL_COMMUNITY)
Admission: EM | Admit: 2011-01-05 | Discharge: 2011-01-06 | Disposition: A | Discharge status: Home / Self Care | Payer: Medicaid Other | Attending: Emergency Medicine | Admitting: Emergency Medicine

## 2011-01-05 DIAGNOSIS — R197 Diarrhea, unspecified: Secondary | ICD-10-CM | POA: Insufficient documentation

## 2011-01-05 DIAGNOSIS — J45909 Unspecified asthma, uncomplicated: Secondary | ICD-10-CM | POA: Insufficient documentation

## 2011-01-05 DIAGNOSIS — Z8619 Personal history of other infectious and parasitic diseases: Secondary | ICD-10-CM | POA: Insufficient documentation

## 2011-01-05 DIAGNOSIS — R112 Nausea with vomiting, unspecified: Secondary | ICD-10-CM | POA: Insufficient documentation

## 2011-01-05 DIAGNOSIS — R1012 Left upper quadrant pain: Secondary | ICD-10-CM | POA: Insufficient documentation

## 2011-01-05 DIAGNOSIS — K861 Other chronic pancreatitis: Secondary | ICD-10-CM | POA: Insufficient documentation

## 2011-01-05 DIAGNOSIS — G8929 Other chronic pain: Secondary | ICD-10-CM | POA: Insufficient documentation

## 2011-01-05 LAB — URINALYSIS, ROUTINE W REFLEX MICROSCOPIC
Glucose, UA: NEGATIVE mg/dL
Ketones, ur: NEGATIVE mg/dL
Protein, ur: NEGATIVE mg/dL
pH: 7 (ref 5.0–8.0)

## 2011-01-05 LAB — DIFFERENTIAL
Basophils Absolute: 0 10*3/uL (ref 0.0–0.1)
Basophils Relative: 0 % (ref 0–1)
Monocytes Relative: 6 % (ref 3–12)
Neutro Abs: 5.5 10*3/uL (ref 1.7–7.7)
Neutrophils Relative %: 59 % (ref 43–77)

## 2011-01-05 LAB — CBC
Hemoglobin: 12.9 g/dL (ref 12.0–15.0)
MCH: 27.3 pg (ref 26.0–34.0)
RBC: 4.73 MIL/uL (ref 3.87–5.11)
WBC: 9.3 10*3/uL (ref 4.0–10.5)

## 2011-01-05 LAB — COMPREHENSIVE METABOLIC PANEL
ALT: 76 U/L — ABNORMAL HIGH (ref 0–35)
AST: 59 U/L — ABNORMAL HIGH (ref 0–37)
Albumin: 4 g/dL (ref 3.5–5.2)
Alkaline Phosphatase: 84 U/L (ref 39–117)
CO2: 24 mEq/L (ref 19–32)
Chloride: 104 mEq/L (ref 96–112)
GFR calc non Af Amer: >60 mL/min (ref >60)
Potassium: 4 mEq/L (ref 3.5–5.1)
Sodium: 138 mEq/L (ref 135–145)
Total Bilirubin: 0.2 mg/dL — ABNORMAL LOW (ref 0.3–1.2)

## 2011-01-05 LAB — POCT PREGNANCY, URINE: Preg Test, Ur: NEGATIVE

## 2011-01-05 LAB — URINE MICROSCOPIC-ADD ON

## 2011-01-06 LAB — ACETAMINOPHEN LEVEL: Acetaminophen (Tylenol), Serum: <15 ug/mL (ref 10–30)

## 2011-01-19 ENCOUNTER — Emergency Department (HOSPITAL_COMMUNITY)
Admission: EM | Admit: 2011-01-19 | Discharge: 2011-01-19 | Disposition: A | Discharge status: Home / Self Care | Payer: Medicaid Other | Attending: Emergency Medicine | Admitting: Emergency Medicine

## 2011-01-19 DIAGNOSIS — R112 Nausea with vomiting, unspecified: Secondary | ICD-10-CM | POA: Insufficient documentation

## 2011-01-19 DIAGNOSIS — K861 Other chronic pancreatitis: Secondary | ICD-10-CM | POA: Insufficient documentation

## 2011-01-19 DIAGNOSIS — R109 Unspecified abdominal pain: Secondary | ICD-10-CM | POA: Insufficient documentation

## 2011-01-19 DIAGNOSIS — R1012 Left upper quadrant pain: Secondary | ICD-10-CM | POA: Insufficient documentation

## 2011-01-19 DIAGNOSIS — G8929 Other chronic pain: Secondary | ICD-10-CM | POA: Insufficient documentation

## 2011-01-19 DIAGNOSIS — J45909 Unspecified asthma, uncomplicated: Secondary | ICD-10-CM | POA: Insufficient documentation

## 2011-01-19 LAB — COMPREHENSIVE METABOLIC PANEL
Albumin: 3.8 g/dL (ref 3.5–5.2)
BUN: 13 mg/dL (ref 6–23)
CO2: 27 mEq/L (ref 19–32)
Calcium: 9.2 mg/dL (ref 8.4–10.5)
Creatinine, Ser: 0.63 mg/dL (ref 0.50–1.10)
GFR calc non Af Amer: >60 mL/min (ref >60)
Potassium: 3.8 mEq/L (ref 3.5–5.1)
Sodium: 140 mEq/L (ref 135–145)
Total Bilirubin: 0.2 mg/dL — ABNORMAL LOW (ref 0.3–1.2)

## 2011-01-19 LAB — URINALYSIS, ROUTINE W REFLEX MICROSCOPIC
Nitrite: NEGATIVE
Specific Gravity, Urine: 1.025 (ref 1.005–1.030)
Urobilinogen, UA: 0.2 mg/dL (ref 0.0–1.0)

## 2011-01-19 LAB — LIPASE, BLOOD: Lipase: 49 U/L (ref 11–59)

## 2011-01-19 LAB — CBC
Hemoglobin: 12.7 g/dL (ref 12.0–15.0)
MCHC: 34.5 g/dL (ref 30.0–36.0)
Platelets: 298 10*3/uL (ref 150–400)
RBC: 4.64 MIL/uL (ref 3.87–5.11)

## 2011-01-19 LAB — POCT PREGNANCY, URINE: Preg Test, Ur: NEGATIVE

## 2011-01-19 LAB — DIFFERENTIAL
Basophils Absolute: 0 10*3/uL (ref 0.0–0.1)
Basophils Relative: 1 % (ref 0–1)
Eosinophils Absolute: 0.3 10*3/uL (ref 0.0–0.7)
Neutro Abs: 4.3 10*3/uL (ref 1.7–7.7)
Neutrophils Relative %: 50 % (ref 43–77)

## 2011-01-20 NOTE — Consult Note (Signed)
[name], [name]               ACCOUNT NO.:  [id number]  MEDICAL RECORD NO.:  [id number]  LOCATION:                                 FACILITY:  PHYSICIAN:  Kalista Laguardia C. Madilyn Fireman, M.D.    DATE OF BIRTH:  03/22/1981  DATE OF CONSULTATION:  12/13/2010 DATE OF DISCHARGE:                                CONSULTATION   REASON FOR CONSULTATION:  Recurrent pancreatitis and elevated liver function tests.  HISTORY OF PRESENT ILLNESS:  The patient is a 30 year old black female who is admitted with recurrent abdominal pain and serologic evidence of pancreatitis.  This is one of multiple admissions for abdominal pain with and without serologic evidence of pancreatitis since her gallbladder was removed after a bout of pancreatitis in 2008.  Despite having elevated lipases in the past, I counted a consecutive CT scans dating back to the time of her cholecystectomy, which it was commented that the pancreas appeared normal.  Also, she has had admissions when she had abdominal pain and did not have an elevated lipase and has had 2 surgeries for lysis of adhesions by Dr. Carolynne Edouard in 2010.  She has also had an EUS to rule out common bile duct stones by Dr. Dulce Sellar and a celiac plexus block.  This was after I had attempted an ERCP unsuccessfully, but noted a normal pancreatic duct.  She has also had two negative MRCPs or stones, the latter being done today.  However, the radiologist noted that she could not identify connection between the ventral and dorsal pancreatic ducts raising a possible issue of pancreas divisum.  The patient has been referred by our group to Dr. Ivan Anchors at [facility] for a  possible advanced pancreatic or biliary diagnostics and treatment.  But her appointment is not there until August 24th. Currently, she presents after being discharged 2 months ago with pancreatitis with a 2-3 day history of epigastric abdominal pain and 2 weeks admission with epigastric abdominal pain and lipase  was normal at that time.  On this admission, her lipase was elevated at 22, but has fallen to normal.  Also her AST was elevated at 217, it is now 64.  ALT 194 falling to 109, bilirubin and alkaline phosphatase normal.  It is not clear to me whether she has had elevated transaminases in the past with episodes of abdominal pain.  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  PAST MEDICAL HISTORY: 1. Report of elevated liver function tests in the past of unclear     etiology by chart history. 2. Uterine fibroids. 3. Chronic pancreatitis. 4. History of gallbladder sludge. 5. History of asthma.  PAST SURGICAL HISTORY: 1. Cholecystectomy. 2. Hysterectomy. 3. Exploratory laparotomy with lysis of adhesions x2.  SOCIAL HISTORY:  The patient lives with her children aged 18 and 31.  She denies alcohol or tobacco use.  PHYSICAL EXAMINATION:  HEART:  Regular rate and rhythm without murmur. LUNGS:  Clear. ABDOMEN:  Soft, nondistended with normoactive bowel sounds.  No hepatosplenomegaly or mass.  There is tenderness with guarding in the epigastrium and diffusely  in general.  IMPRESSION:  Recurrent abdominal pain with atypical presentation for pancreatitis with intermittent recurrent elevated lipase and some elevated liver function tests during pain.  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  PLAN:  Treat supportively here in the hospital.  We will obtain IgG4 level to rule out autoimmune pancreatitis, which seems very unlikely. It was conceivable that we could repeat an ERCP attempt, although a previous one was unsuccessful.  However, it may be best to defer that until her tertiary care GI referral.          ______________________________ Everardo All. Madilyn Fireman, M.D.     JCH/MEDQ  D:   12/13/2010  T:  12/13/2010  Job:  [id number]  Electronically Signed by Dorena Cookey M.D. on 01/20/2011 07:00:48 PM

## 2011-01-27 ENCOUNTER — Emergency Department (HOSPITAL_COMMUNITY)
Admission: EM | Admit: 2011-01-27 | Discharge: 2011-01-28 | Disposition: A | Discharge status: Home / Self Care | Payer: Medicaid Other | Attending: Emergency Medicine | Admitting: Emergency Medicine

## 2011-01-27 DIAGNOSIS — K861 Other chronic pancreatitis: Secondary | ICD-10-CM | POA: Insufficient documentation

## 2011-01-27 DIAGNOSIS — R197 Diarrhea, unspecified: Secondary | ICD-10-CM | POA: Insufficient documentation

## 2011-01-27 DIAGNOSIS — J45909 Unspecified asthma, uncomplicated: Secondary | ICD-10-CM | POA: Insufficient documentation

## 2011-01-27 DIAGNOSIS — R1012 Left upper quadrant pain: Secondary | ICD-10-CM | POA: Insufficient documentation

## 2011-01-27 DIAGNOSIS — R112 Nausea with vomiting, unspecified: Secondary | ICD-10-CM | POA: Insufficient documentation

## 2011-01-27 DIAGNOSIS — R1013 Epigastric pain: Secondary | ICD-10-CM | POA: Insufficient documentation

## 2011-01-27 LAB — COMPREHENSIVE METABOLIC PANEL WITH GFR
ALT: 14 U/L (ref 0–35)
AST: 16 U/L (ref 0–37)
Albumin: 4.2 g/dL (ref 3.5–5.2)
CO2: 22 meq/L (ref 19–32)
Chloride: 106 meq/L (ref 96–112)
Creatinine, Ser: 0.61 mg/dL (ref 0.50–1.10)
GFR calc non Af Amer: >60 mL/min (ref >60)
Sodium: 139 meq/L (ref 135–145)
Total Bilirubin: 0.2 mg/dL — ABNORMAL LOW (ref 0.3–1.2)

## 2011-01-27 LAB — POCT PREGNANCY, URINE: Preg Test, Ur: NEGATIVE

## 2011-01-27 LAB — DIFFERENTIAL
Basophils Absolute: 0.1 K/uL (ref 0.0–0.1)
Basophils Relative: 1 % (ref 0–1)
Eosinophils Absolute: 0.4 K/uL (ref 0.0–0.7)
Eosinophils Relative: 4 % (ref 0–5)
Lymphocytes Relative: 39 % (ref 12–46)
Lymphs Abs: 3.8 10*3/uL (ref 0.7–4.0)
Monocytes Absolute: 0.7 10*3/uL (ref 0.1–1.0)
Monocytes Relative: 7 % (ref 3–12)
Neutro Abs: 4.8 10*3/uL (ref 1.7–7.7)
Neutrophils Relative %: 49 % (ref 43–77)

## 2011-01-27 LAB — CBC
HCT: 39.3 % (ref 36.0–46.0)
Hemoglobin: 13.7 g/dL (ref 12.0–15.0)
MCH: 27.7 pg (ref 26.0–34.0)
MCHC: 34.9 g/dL (ref 30.0–36.0)
MCV: 79.4 fL (ref 78.0–100.0)
Platelets: 294 K/uL (ref 150–400)
RBC: 4.95 MIL/uL (ref 3.87–5.11)
RDW: 12.2 % (ref 11.5–15.5)
WBC: 9.7 K/uL (ref 4.0–10.5)

## 2011-01-27 LAB — URINALYSIS, ROUTINE W REFLEX MICROSCOPIC
Bilirubin Urine: NEGATIVE
Ketones, ur: NEGATIVE mg/dL
Nitrite: NEGATIVE
Urobilinogen, UA: 0.2 mg/dL (ref 0.0–1.0)

## 2011-01-27 LAB — COMPREHENSIVE METABOLIC PANEL
Alkaline Phosphatase: 70 U/L (ref 39–117)
BUN: 15 mg/dL (ref 6–23)
Calcium: 9.7 mg/dL (ref 8.4–10.5)
GFR calc Af Amer: >60 mL/min (ref >60)
Glucose, Bld: 81 mg/dL (ref 70–99)
Potassium: 3.4 mEq/L — ABNORMAL LOW (ref 3.5–5.1)
Total Protein: 8.3 g/dL (ref 6.0–8.3)

## 2011-01-27 LAB — LIPASE, BLOOD: Lipase: 91 U/L — ABNORMAL HIGH (ref 11–59)

## 2011-01-30 LAB — URINE CULTURE
Colony Count: >=100000
Culture  Setup Time: 201207252241

## 2011-02-01 ENCOUNTER — Inpatient Hospital Stay (HOSPITAL_COMMUNITY)
Admission: EM | Admit: 2011-02-01 | Discharge: 2011-02-07 | DRG: 440 | Disposition: A | Discharge status: Home / Self Care | Payer: Medicaid Other | Attending: Internal Medicine | Admitting: Internal Medicine

## 2011-02-01 DIAGNOSIS — K861 Other chronic pancreatitis: Secondary | ICD-10-CM | POA: Diagnosis present

## 2011-02-01 DIAGNOSIS — K859 Acute pancreatitis without necrosis or infection, unspecified: Principal | ICD-10-CM | POA: Diagnosis present

## 2011-02-01 DIAGNOSIS — E876 Hypokalemia: Secondary | ICD-10-CM | POA: Diagnosis present

## 2011-02-01 DIAGNOSIS — Z888 Allergy status to other drugs, medicaments and biological substances status: Secondary | ICD-10-CM

## 2011-02-01 DIAGNOSIS — G8929 Other chronic pain: Secondary | ICD-10-CM | POA: Diagnosis present

## 2011-02-01 DIAGNOSIS — J45909 Unspecified asthma, uncomplicated: Secondary | ICD-10-CM | POA: Diagnosis present

## 2011-02-01 DIAGNOSIS — Z79899 Other long term (current) drug therapy: Secondary | ICD-10-CM

## 2011-02-02 LAB — COMPREHENSIVE METABOLIC PANEL
ALT: 236 U/L — ABNORMAL HIGH (ref 0–35)
Albumin: 3.9 g/dL (ref 3.5–5.2)
Alkaline Phosphatase: 109 U/L (ref 39–117)
BUN: 10 mg/dL (ref 6–23)
Calcium: 9.1 mg/dL (ref 8.4–10.5)
Potassium: 3.7 mEq/L (ref 3.5–5.1)
Sodium: 141 mEq/L (ref 135–145)
Total Protein: 7.8 g/dL (ref 6.0–8.3)

## 2011-02-02 LAB — HEPATIC FUNCTION PANEL
ALT: 40 U/L — ABNORMAL HIGH (ref 0–35)
AST: 26 U/L (ref 0–37)
Alkaline Phosphatase: 70 U/L (ref 39–117)
Bilirubin, Direct: 0.1 mg/dL (ref 0.0–0.3)
Total Bilirubin: 0.2 mg/dL — ABNORMAL LOW (ref 0.3–1.2)

## 2011-02-02 LAB — CBC
HCT: 38.7 % (ref 36.0–46.0)
Hemoglobin: 13 g/dL (ref 12.0–15.0)
MCV: 80 fL (ref 78.0–100.0)
RBC: 4.84 MIL/uL (ref 3.87–5.11)
RDW: 12.4 % (ref 11.5–15.5)
WBC: 9.9 10*3/uL (ref 4.0–10.5)

## 2011-02-02 LAB — RAPID URINE DRUG SCREEN, HOSP PERFORMED
Amphetamines: NOT DETECTED
Barbiturates: NOT DETECTED
Tetrahydrocannabinol: NOT DETECTED

## 2011-02-02 LAB — DIFFERENTIAL
Eosinophils Relative: 4 % (ref 0–5)
Lymphocytes Relative: 43 % (ref 12–46)
Lymphs Abs: 4.2 10*3/uL — ABNORMAL HIGH (ref 0.7–4.0)
Neutro Abs: 4.5 10*3/uL (ref 1.7–7.7)

## 2011-02-02 LAB — BASIC METABOLIC PANEL
BUN: 15 mg/dL (ref 6–23)
Calcium: 9.5 mg/dL (ref 8.4–10.5)
GFR calc non Af Amer: >60 mL/min (ref >60)
Glucose, Bld: 81 mg/dL (ref 70–99)

## 2011-02-02 LAB — MAGNESIUM: Magnesium: 2.2 mg/dL (ref 1.5–2.5)

## 2011-02-02 LAB — PHOSPHORUS: Phosphorus: 3.6 mg/dL (ref 2.3–4.6)

## 2011-02-03 LAB — COMPREHENSIVE METABOLIC PANEL
AST: 138 U/L — ABNORMAL HIGH (ref 0–37)
Albumin: 3.4 g/dL — ABNORMAL LOW (ref 3.5–5.2)
Chloride: 105 mEq/L (ref 96–112)
Creatinine, Ser: 0.5 mg/dL (ref 0.50–1.10)
Potassium: 3.7 mEq/L (ref 3.5–5.1)
Total Bilirubin: 0.5 mg/dL (ref 0.3–1.2)
Total Protein: 6.9 g/dL (ref 6.0–8.3)

## 2011-02-03 LAB — CBC
MCHC: 33.9 g/dL (ref 30.0–36.0)
MCV: 80.3 fL (ref 78.0–100.0)
Platelets: 263 10*3/uL (ref 150–400)
RDW: 12.1 % (ref 11.5–15.5)
WBC: 6.2 10*3/uL (ref 4.0–10.5)

## 2011-02-03 LAB — LIPASE, BLOOD: Lipase: 52 U/L (ref 11–59)

## 2011-02-03 NOTE — H&P (Signed)
[name], [name]               ACCOUNT NO.:  [id number]  MEDICAL RECORD NO.:  [id number]  LOCATION:  [facility]                         FACILITY:  [facility]  PHYSICIAN:  Michiel Cowboy, MDDATE OF BIRTH:  01/25/1981  DATE OF ADMISSION:  02/01/2011 DATE OF DISCHARGE:                             HISTORY & PHYSICAL   PRIMARY CARE PROVIDER:  [facility]  CHIEF COMPLAINT:  Abdominal pain.  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  The patient presented today to emergency department stating that the Percocet does not help her pain.  She had some nausea and vomiting and abdominal pain which is worse at her baseline.  Of note, overnight I did walk into room, she was asleep, I had to wake her up to obtain history.  The patient denied any fevers but states that she often feels hot, did not endorse any chest pain, shortness of breath.  Otherwise, review of systems is negative except for HPI.  Of note, the patient has been evaluated for this extensively in the past.  She is supposed to follow up at [facility] for further studies with her sphincter of Oddi.  She has had two negative MRCP so far.  There is some evidence of potential chronic pancreatitis.  The plan was for the patient to follow up with [facility], she has an appointment on February 17, 2011.  She did not wish to go home at this point secondary to her pain and wanted to be admitted for pain management.  REVIEW OF SYSTEMS:  Otherwise, negative except for HPI.  PAST MEDICAL HISTORY:  Significant for: 1. Chronic abdominal pain. 2. Chronic pancreatitis. 3. Asthma.  SOCIAL HISTORY:  The patient does not smoke, drink, or abuse drugs.  FAMILY HISTORY:  Noncontributory.  ALLERGIES:  IBUPROFEN causes rash and itching, DARVOCET causes shortness of breath, TRAMADOL causes  rash, DEMEROL causes rash and itching and shortness of breath, MORPHINE SULFATE causes rash, ASPIRIN causes rash, PANTOPRAZOLE causes rash.  KETORALAC causes mouth and tongue numbness, and ACETAMINOPHEN causes itch and rash.  MEDICATIONS:  She takes: 1. Zofran 4 mg one tab every 6 hours as needed. 2. Percocet which is oxycodone with acetaminophen 5/325 mg one tablet     every 6 hours as needed as well as note that did not cause any     rash. 3. Estradiol 2 mg 1 tablet daily at bedtime. 4. Benadryl 25 mg, she takes half a tablet every 4 hours as needed for     itching.  Of note, the patient does endorse almost chronic itching.  PHYSICAL EXAMINATION:  VITAL SIGNS:  Temperature 98.1; blood pressure 101/79; pulse 72, now 66; respirations 16; satting 97% on room air. GENERAL:  The patient appears to be in no acute distress.  She is very comfortable, resting. HEENT:  Head:  Nontraumatic.  Moist mucous membranes. LUNGS:  Clear to auscultation bilaterally. HEART:  Regular rhythm, slow. ABDOMEN:  Soft, she does endorse epigastric tenderness, suprapubic tenderness as well as states that her pain goes to her back.  LABORATORY DATA:  White blood  cell count 9.9, hemoglobin 13.  Sodium 141, potassium 3.2, creatinine 0.69, lipase 226, ALT slightly elevated at 40.  ASSESSMENT AND PLAN:  This is a 30 year old female with recurrent admissions for abdominal pain with some behavior which has been suspected in the past to be opioid-seeking behavior. 1. Chronic pain.  We will attempt to minimize use of opioids as this     can negatively affect sphincter of Oddi.  She is only able to     tolerate Dilaudid.  We will write for Dilaudid 0.5-1 q.4 h. We will     attempt to wean this down if possible, and the patient will need to     have followup in Pain Clinic to prevent such high readmission rate.     Also, she will need to follow up with her GI at [facility]. 2. Chronic pancreatitis with possible acute  exacerbation.  We will     treat with bowel rest and IV fluids. 3. History of asthma.  Appears to be stable.  We will make sure she     has albuterol p.r.n. 4. Hypokalemia.  We will replace. 5. Prophylaxis.  The patient is allergic to PANTOPRAZOLE.  I will     write for SCDs.  I have spent a total of 55 min on this admission.   Michiel Cowboy, MD     AVD/MEDQ  D:  02/02/2011  T:  02/02/2011  Job:  [id number]  cc:   [facility]  Electronically Signed by Therisa Doyne MD on 02/03/2011 12:06:41 AM

## 2011-02-04 LAB — BASIC METABOLIC PANEL
BUN: 4 mg/dL — ABNORMAL LOW (ref 6–23)
CO2: 24 mEq/L (ref 19–32)
Calcium: 9.1 mg/dL (ref 8.4–10.5)
Chloride: 105 mEq/L (ref 96–112)
Creatinine, Ser: 0.56 mg/dL (ref 0.50–1.10)
Glucose, Bld: 92 mg/dL (ref 70–99)

## 2011-02-04 LAB — HEPATIC FUNCTION PANEL
ALT: 295 U/L — ABNORMAL HIGH (ref 0–35)
AST: 393 U/L — ABNORMAL HIGH (ref 0–37)
Alkaline Phosphatase: 129 U/L — ABNORMAL HIGH (ref 39–117)
Indirect Bilirubin: 0.3 mg/dL (ref 0.3–0.9)
Total Protein: 7.8 g/dL (ref 6.0–8.3)

## 2011-02-05 LAB — LIPID PANEL
Cholesterol: 185 mg/dL (ref 0–200)
Total CHOL/HDL Ratio: 2.8 RATIO
VLDL: 20 mg/dL (ref 0–40)

## 2011-02-05 LAB — CBC
HCT: 38.3 % (ref 36.0–46.0)
Hemoglobin: 13 g/dL (ref 12.0–15.0)
MCH: 27.4 pg (ref 26.0–34.0)
MCHC: 33.9 g/dL (ref 30.0–36.0)
MCV: 80.8 fL (ref 78.0–100.0)
RDW: 12.1 % (ref 11.5–15.5)

## 2011-02-05 LAB — BASIC METABOLIC PANEL
CO2: 25 mEq/L (ref 19–32)
Calcium: 9 mg/dL (ref 8.4–10.5)
Creatinine, Ser: 0.59 mg/dL (ref 0.50–1.10)
GFR calc non Af Amer: >60 mL/min (ref >60)
Sodium: 139 mEq/L (ref 135–145)

## 2011-02-05 LAB — AMYLASE: Amylase: 56 U/L (ref 0–105)

## 2011-02-05 LAB — LIPASE, BLOOD: Lipase: 32 U/L (ref 11–59)

## 2011-02-05 LAB — HEPATIC FUNCTION PANEL
Albumin: 3.7 g/dL (ref 3.5–5.2)
Alkaline Phosphatase: 114 U/L (ref 39–117)
Bilirubin, Direct: 0.1 mg/dL (ref 0.0–0.3)
Total Bilirubin: 0.4 mg/dL (ref 0.3–1.2)

## 2011-02-07 LAB — AMYLASE: Amylase: 62 U/L (ref 0–105)

## 2011-02-07 LAB — HEPATIC FUNCTION PANEL
AST: 45 U/L — ABNORMAL HIGH (ref 0–37)
Albumin: 3.2 g/dL — ABNORMAL LOW (ref 3.5–5.2)
Alkaline Phosphatase: 100 U/L (ref 39–117)
Total Bilirubin: 0.2 mg/dL — ABNORMAL LOW (ref 0.3–1.2)
Total Protein: 6.7 g/dL (ref 6.0–8.3)

## 2011-02-07 LAB — LIPASE, BLOOD: Lipase: 52 U/L (ref 11–59)

## 2011-02-07 NOTE — Discharge Summary (Signed)
  NAMEKADAJAH, KJOS               ACCOUNT NO.:  [id number]  MEDICAL RECORD NO.:  [id number]  LOCATION:  5019                         FACILITY:  [facility]  PHYSICIAN:  Conley Canal, MD      DATE OF BIRTH:  01-17-1981  DATE OF ADMISSION:  02/01/2011 DATE OF DISCHARGE:  02/07/2011                        DISCHARGE SUMMARY - REFERRING   PRIMARY CARE PHYSICIAN:  [facility].  DISCHARGE DIAGNOSES: 1. Acute on chronic pancreatitis, cause of pancreatitis of unknown,     possibility of pancreatic divisum.  The patient to be followed at     [facility] on February 22, 2011, for further workup. 2. Mild bronchial asthma without exacerbation.  PROCEDURES PERFORMED:  None.  HOSPITAL COURSE:  Ms. Bollman is a pleasant 30 year old female, who came in with complaints of abdominal pain and was found to have acute on chronic pancreatitis.  The patient is status post cholecystectomy, but at admission there was a biliary obstruction picture with LFTs elevated,the highest being AST 329, ALT 236, alkaline phosphatase 109, total bilirubin 0.5, and the highest lipase was 226, amylase 117.  The patient was hence placed n.p.o. and slowly improved today.  Amylase is 62, lipase 52, alkaline phosphatase 100, AST 245, ALT 129, total bilirubin 0.2, direct bilirubin 0.1, indirect bilirubin 0.1.  Lipids panel was unremarkable.  The patient has been evaluated by GI in the past and referred to [facility] where she has an appointment on February 22, 2011, at 1:00 p.m. to be considered for further GI workup.  She denies alcoholism.  DISCHARGE MEDICATIONS: 1. Oxycodone IR 5 mg every 4 hours as needed, total 40 tablets given. 2. Senokot 2 tablets daily as needed. 3. Benadryl 12.5 mg every 4 hours as needed. 4. Estradiol 2 mg at bedtime. 5. Zofran 4 mg every 6 hours as needed.  Time spent for discharge preparation less than 30 minutes.  The patient discharged in stable condition.     Conley Canal, MD    SR/MEDQ  D:   02/07/2011  T:  02/07/2011  Job:  [id number]  cc:   [facility]  Electronically Signed by Conley Canal  on 02/07/2011 05:47:31 PM

## 2011-02-23 ENCOUNTER — Emergency Department (HOSPITAL_COMMUNITY)
Admission: EM | Admit: 2011-02-23 | Discharge: 2011-02-23 | Disposition: A | Discharge status: Home / Self Care | Payer: Medicaid Other | Attending: Emergency Medicine | Admitting: Emergency Medicine

## 2011-02-23 DIAGNOSIS — R109 Unspecified abdominal pain: Secondary | ICD-10-CM | POA: Insufficient documentation

## 2011-02-24 ENCOUNTER — Emergency Department (HOSPITAL_COMMUNITY)
Admission: EM | Admit: 2011-02-24 | Discharge: 2011-02-24 | Disposition: A | Discharge status: Home / Self Care | Payer: Medicaid Other | Attending: Emergency Medicine | Admitting: Emergency Medicine

## 2011-02-24 DIAGNOSIS — R112 Nausea with vomiting, unspecified: Secondary | ICD-10-CM | POA: Insufficient documentation

## 2011-02-24 DIAGNOSIS — K861 Other chronic pancreatitis: Secondary | ICD-10-CM | POA: Insufficient documentation

## 2011-02-24 DIAGNOSIS — R197 Diarrhea, unspecified: Secondary | ICD-10-CM | POA: Insufficient documentation

## 2011-02-24 DIAGNOSIS — R109 Unspecified abdominal pain: Secondary | ICD-10-CM | POA: Insufficient documentation

## 2011-02-24 DIAGNOSIS — Z8619 Personal history of other infectious and parasitic diseases: Secondary | ICD-10-CM | POA: Insufficient documentation

## 2011-02-24 LAB — DIFFERENTIAL
Basophils Absolute: 0 10*3/uL (ref 0.0–0.1)
Basophils Relative: 0 % (ref 0–1)
Lymphocytes Relative: 25 % (ref 12–46)
Neutro Abs: 6.7 10*3/uL (ref 1.7–7.7)
Neutrophils Relative %: 65 % (ref 43–77)

## 2011-02-24 LAB — URINALYSIS, ROUTINE W REFLEX MICROSCOPIC
Glucose, UA: NEGATIVE mg/dL
Leukocytes, UA: NEGATIVE
Specific Gravity, Urine: 1.02 (ref 1.005–1.030)
pH: 5.5 (ref 5.0–8.0)

## 2011-02-24 LAB — URINE MICROSCOPIC-ADD ON

## 2011-02-24 LAB — COMPREHENSIVE METABOLIC PANEL
Albumin: 3.9 g/dL (ref 3.5–5.2)
BUN: 11 mg/dL (ref 6–23)
Calcium: 9.6 mg/dL (ref 8.4–10.5)
Chloride: 106 mEq/L (ref 96–112)
Creatinine, Ser: 0.57 mg/dL (ref 0.50–1.10)
Total Bilirubin: 0.3 mg/dL (ref 0.3–1.2)

## 2011-02-24 LAB — LIPASE, BLOOD: Lipase: 41 U/L (ref 11–59)

## 2011-02-24 LAB — CBC
HCT: 38.6 % (ref 36.0–46.0)
Hemoglobin: 13.4 g/dL (ref 12.0–15.0)
RBC: 4.86 MIL/uL (ref 3.87–5.11)

## 2011-03-01 ENCOUNTER — Emergency Department (HOSPITAL_COMMUNITY)
Admission: EM | Admit: 2011-03-01 | Discharge: 2011-03-01 | Disposition: A | Discharge status: Home / Self Care | Payer: Medicaid Other | Attending: Emergency Medicine | Admitting: Emergency Medicine

## 2011-03-01 DIAGNOSIS — R112 Nausea with vomiting, unspecified: Secondary | ICD-10-CM | POA: Insufficient documentation

## 2011-03-01 DIAGNOSIS — J45909 Unspecified asthma, uncomplicated: Secondary | ICD-10-CM | POA: Insufficient documentation

## 2011-03-01 DIAGNOSIS — R109 Unspecified abdominal pain: Secondary | ICD-10-CM | POA: Insufficient documentation

## 2011-03-01 DIAGNOSIS — R197 Diarrhea, unspecified: Secondary | ICD-10-CM | POA: Insufficient documentation

## 2011-03-01 DIAGNOSIS — K861 Other chronic pancreatitis: Secondary | ICD-10-CM | POA: Insufficient documentation

## 2011-03-01 LAB — URINALYSIS, ROUTINE W REFLEX MICROSCOPIC
Glucose, UA: NEGATIVE mg/dL
Leukocytes, UA: NEGATIVE
Protein, ur: NEGATIVE mg/dL
pH: 6 (ref 5.0–8.0)

## 2011-03-01 LAB — COMPREHENSIVE METABOLIC PANEL
ALT: 9 U/L (ref 0–35)
AST: 14 U/L (ref 0–37)
Alkaline Phosphatase: 60 U/L (ref 39–117)
CO2: 28 mEq/L (ref 19–32)
Chloride: 105 mEq/L (ref 96–112)
Creatinine, Ser: 0.59 mg/dL (ref 0.50–1.10)
GFR calc non Af Amer: >60 mL/min (ref >60)
Potassium: 3.9 mEq/L (ref 3.5–5.1)
Sodium: 140 mEq/L (ref 135–145)
Total Bilirubin: 0.4 mg/dL (ref 0.3–1.2)

## 2011-03-01 LAB — CBC
MCV: 79.6 fL (ref 78.0–100.0)
Platelets: 291 10*3/uL (ref 150–400)
RBC: 4.65 MIL/uL (ref 3.87–5.11)
WBC: 7.3 10*3/uL (ref 4.0–10.5)

## 2011-03-01 LAB — DIFFERENTIAL
Eosinophils Absolute: 0.2 10*3/uL (ref 0.0–0.7)
Lymphs Abs: 2.5 10*3/uL (ref 0.7–4.0)
Neutrophils Relative %: 53 % (ref 43–77)

## 2011-03-01 LAB — POCT PREGNANCY, URINE: Preg Test, Ur: NEGATIVE

## 2011-03-08 ENCOUNTER — Emergency Department (HOSPITAL_COMMUNITY)
Admission: EM | Admit: 2011-03-08 | Discharge: 2011-03-09 | Disposition: A | Discharge status: Home / Self Care | Payer: Medicaid Other | Attending: Emergency Medicine | Admitting: Emergency Medicine

## 2011-03-08 DIAGNOSIS — K861 Other chronic pancreatitis: Secondary | ICD-10-CM | POA: Insufficient documentation

## 2011-03-08 DIAGNOSIS — J45909 Unspecified asthma, uncomplicated: Secondary | ICD-10-CM | POA: Insufficient documentation

## 2011-03-08 DIAGNOSIS — R109 Unspecified abdominal pain: Secondary | ICD-10-CM | POA: Insufficient documentation

## 2011-03-08 DIAGNOSIS — R112 Nausea with vomiting, unspecified: Secondary | ICD-10-CM | POA: Insufficient documentation

## 2011-03-08 DIAGNOSIS — R197 Diarrhea, unspecified: Secondary | ICD-10-CM | POA: Insufficient documentation

## 2011-03-08 LAB — URINALYSIS, ROUTINE W REFLEX MICROSCOPIC
Leukocytes, UA: NEGATIVE
Protein, ur: NEGATIVE mg/dL
Specific Gravity, Urine: 1.022 (ref 1.005–1.030)
Urobilinogen, UA: 0.2 mg/dL (ref 0.0–1.0)

## 2011-03-08 LAB — URINE MICROSCOPIC-ADD ON

## 2011-03-08 LAB — COMPREHENSIVE METABOLIC PANEL
ALT: 9 U/L (ref 0–35)
AST: 16 U/L (ref 0–37)
Calcium: 9 mg/dL (ref 8.4–10.5)
Creatinine, Ser: 0.58 mg/dL (ref 0.50–1.10)
GFR calc Af Amer: >60 mL/min (ref >60)
Glucose, Bld: 86 mg/dL (ref 70–99)
Sodium: 137 mEq/L (ref 135–145)
Total Protein: 7.5 g/dL (ref 6.0–8.3)

## 2011-03-08 LAB — CBC
MCH: 27 pg (ref 26.0–34.0)
MCHC: 33.7 g/dL (ref 30.0–36.0)
MCV: 80 fL (ref 78.0–100.0)
Platelets: 242 10*3/uL (ref 150–400)

## 2011-03-08 LAB — DIFFERENTIAL
Basophils Relative: 1 % (ref 0–1)
Eosinophils Absolute: 0.4 10*3/uL (ref 0.0–0.7)
Eosinophils Relative: 5 % (ref 0–5)
Monocytes Absolute: 0.6 10*3/uL (ref 0.1–1.0)
Monocytes Relative: 7 % (ref 3–12)

## 2011-03-10 ENCOUNTER — Emergency Department (HOSPITAL_COMMUNITY)
Admission: EM | Admit: 2011-03-10 | Discharge: 2011-03-10 | Disposition: A | Discharge status: Home / Self Care | Payer: Medicaid Other | Attending: Emergency Medicine | Admitting: Emergency Medicine

## 2011-03-10 ENCOUNTER — Emergency Department: Payer: Self-pay | Admitting: Emergency Medicine

## 2011-03-10 DIAGNOSIS — E669 Obesity, unspecified: Secondary | ICD-10-CM | POA: Insufficient documentation

## 2011-03-10 DIAGNOSIS — R110 Nausea: Secondary | ICD-10-CM | POA: Insufficient documentation

## 2011-03-10 DIAGNOSIS — G8929 Other chronic pain: Secondary | ICD-10-CM | POA: Insufficient documentation

## 2011-03-10 DIAGNOSIS — Z79899 Other long term (current) drug therapy: Secondary | ICD-10-CM | POA: Insufficient documentation

## 2011-03-10 DIAGNOSIS — L2989 Other pruritus: Secondary | ICD-10-CM | POA: Insufficient documentation

## 2011-03-10 DIAGNOSIS — L298 Other pruritus: Secondary | ICD-10-CM | POA: Insufficient documentation

## 2011-03-10 DIAGNOSIS — R1013 Epigastric pain: Secondary | ICD-10-CM | POA: Insufficient documentation

## 2011-03-10 DIAGNOSIS — Z8619 Personal history of other infectious and parasitic diseases: Secondary | ICD-10-CM | POA: Insufficient documentation

## 2011-03-10 LAB — URINE MICROSCOPIC-ADD ON

## 2011-03-10 LAB — ETHANOL: Alcohol, Ethyl (B): <11 mg/dL (ref 0–11)

## 2011-03-10 LAB — URINALYSIS, ROUTINE W REFLEX MICROSCOPIC
Glucose, UA: NEGATIVE mg/dL
Leukocytes, UA: NEGATIVE
Protein, ur: NEGATIVE mg/dL
Specific Gravity, Urine: 1.027 (ref 1.005–1.030)
pH: 5.5 (ref 5.0–8.0)

## 2011-03-10 LAB — URINE CULTURE
Colony Count: 50000
Culture  Setup Time: 201209040316

## 2011-03-10 LAB — COMPREHENSIVE METABOLIC PANEL
ALT: 69 U/L — ABNORMAL HIGH (ref 0–35)
AST: 39 U/L — ABNORMAL HIGH (ref 0–37)
Albumin: 3.5 g/dL (ref 3.5–5.2)
CO2: 25 mEq/L (ref 19–32)
Calcium: 9.1 mg/dL (ref 8.4–10.5)
Creatinine, Ser: 0.67 mg/dL (ref 0.50–1.10)
Sodium: 139 mEq/L (ref 135–145)
Total Protein: 7.2 g/dL (ref 6.0–8.3)

## 2011-03-15 ENCOUNTER — Emergency Department (HOSPITAL_COMMUNITY)
Admission: EM | Admit: 2011-03-15 | Discharge: 2011-03-16 | Discharge status: Against Medical Advice | Payer: Medicaid Other | Attending: Emergency Medicine | Admitting: Emergency Medicine

## 2011-03-15 DIAGNOSIS — R109 Unspecified abdominal pain: Secondary | ICD-10-CM | POA: Insufficient documentation

## 2011-03-15 DIAGNOSIS — J45909 Unspecified asthma, uncomplicated: Secondary | ICD-10-CM | POA: Insufficient documentation

## 2011-03-15 DIAGNOSIS — R112 Nausea with vomiting, unspecified: Secondary | ICD-10-CM | POA: Insufficient documentation

## 2011-03-16 LAB — DIFFERENTIAL
Basophils Relative: 1 % (ref 0–1)
Eosinophils Absolute: 0.5 10*3/uL (ref 0.0–0.7)
Eosinophils Relative: 6 % — ABNORMAL HIGH (ref 0–5)
Lymphs Abs: 3.5 10*3/uL (ref 0.7–4.0)
Neutrophils Relative %: 48 % (ref 43–77)

## 2011-03-16 LAB — COMPREHENSIVE METABOLIC PANEL
Alkaline Phosphatase: 79 U/L (ref 39–117)
BUN: 13 mg/dL (ref 6–23)
CO2: 28 mEq/L (ref 19–32)
GFR calc Af Amer: >60 mL/min (ref >60)
GFR calc non Af Amer: >60 mL/min (ref >60)
Glucose, Bld: 86 mg/dL (ref 70–99)
Sodium: 139 mEq/L (ref 135–145)
Total Bilirubin: 0.1 mg/dL — ABNORMAL LOW (ref 0.3–1.2)
Total Protein: 7.4 g/dL (ref 6.0–8.3)

## 2011-03-16 LAB — CBC
MCH: 27.5 pg (ref 26.0–34.0)
MCV: 80.6 fL (ref 78.0–100.0)
Platelets: 265 10*3/uL (ref 150–400)
RBC: 4.65 MIL/uL (ref 3.87–5.11)
RDW: 12.3 % (ref 11.5–15.5)
WBC: 9.2 10*3/uL (ref 4.0–10.5)

## 2011-03-16 LAB — LIPASE, BLOOD: Lipase: 123 U/L — ABNORMAL HIGH (ref 11–59)

## 2011-03-17 ENCOUNTER — Inpatient Hospital Stay (HOSPITAL_COMMUNITY)
Admission: EM | Admit: 2011-03-17 | Discharge: 2011-03-21 | DRG: 440 | Disposition: A | Discharge status: Other Acute Inpatient Hospital | Payer: Medicaid Other | Attending: Internal Medicine | Admitting: Internal Medicine

## 2011-03-17 ENCOUNTER — Encounter: Payer: Self-pay | Admitting: Internal Medicine

## 2011-03-17 DIAGNOSIS — R109 Unspecified abdominal pain: Secondary | ICD-10-CM

## 2011-03-17 DIAGNOSIS — K859 Acute pancreatitis without necrosis or infection, unspecified: Principal | ICD-10-CM | POA: Diagnosis present

## 2011-03-17 DIAGNOSIS — R740 Nonspecific elevation of levels of transaminase and lactic acid dehydrogenase [LDH]: Secondary | ICD-10-CM

## 2011-03-17 DIAGNOSIS — Z7982 Long term (current) use of aspirin: Secondary | ICD-10-CM

## 2011-03-17 DIAGNOSIS — F17200 Nicotine dependence, unspecified, uncomplicated: Secondary | ICD-10-CM | POA: Diagnosis present

## 2011-03-17 DIAGNOSIS — R7989 Other specified abnormal findings of blood chemistry: Secondary | ICD-10-CM | POA: Diagnosis present

## 2011-03-17 DIAGNOSIS — J45909 Unspecified asthma, uncomplicated: Secondary | ICD-10-CM | POA: Diagnosis present

## 2011-03-17 LAB — URINALYSIS, ROUTINE W REFLEX MICROSCOPIC
Bilirubin Urine: NEGATIVE
Nitrite: NEGATIVE
Specific Gravity, Urine: 1.031 — ABNORMAL HIGH (ref 1.005–1.030)
Urobilinogen, UA: 1 mg/dL (ref 0.0–1.0)
pH: 5.5 (ref 5.0–8.0)

## 2011-03-17 LAB — COMPREHENSIVE METABOLIC PANEL
ALT: 367 U/L — ABNORMAL HIGH (ref 0–35)
Alkaline Phosphatase: 104 U/L (ref 39–117)
BUN: 15 mg/dL (ref 6–23)
Chloride: 106 mEq/L (ref 96–112)
GFR calc Af Amer: >60 mL/min (ref >60)
Glucose, Bld: 78 mg/dL (ref 70–99)
Potassium: 3.5 mEq/L (ref 3.5–5.1)
Sodium: 143 mEq/L (ref 135–145)
Total Bilirubin: 0.4 mg/dL (ref 0.3–1.2)

## 2011-03-17 LAB — URINE MICROSCOPIC-ADD ON

## 2011-03-17 LAB — DIFFERENTIAL
Basophils Absolute: 0 10*3/uL (ref 0.0–0.1)
Basophils Relative: 1 % (ref 0–1)
Eosinophils Absolute: 0.4 10*3/uL (ref 0.0–0.7)
Eosinophils Relative: 6 % — ABNORMAL HIGH (ref 0–5)

## 2011-03-17 LAB — CBC
MCHC: 34.4 g/dL (ref 30.0–36.0)
Platelets: 253 10*3/uL (ref 150–400)
RDW: 12.1 % (ref 11.5–15.5)
WBC: 6.5 10*3/uL (ref 4.0–10.5)

## 2011-03-17 LAB — ACETAMINOPHEN LEVEL: Acetaminophen (Tylenol), Serum: <15 ug/mL (ref 10–30)

## 2011-03-17 LAB — LIPASE, BLOOD: Lipase: 172 U/L — ABNORMAL HIGH (ref 11–59)

## 2011-03-17 LAB — WET PREP, GENITAL: WBC, Wet Prep HPF POC: NONE SEEN

## 2011-03-17 NOTE — H&P (Addendum)
Hospital Admission Note Date: 03/17/2011  Patient name: [name] Medical record number: [id number] Date of birth: [birth year] Age: 30 y.o. Gender: female PCP: Rene Paci, MD, MD vs. Dala Dock  Medical Service:  General Medicine Teaching Service B1  Attending physician:   Dr. Doneen Poisson   Resident [phone]):   Dr. Loistine Chance  Pager:  [phone] Resident (R1):   Dr. Clyde Lundborg   Pager:  [phone]  Chief Complaint: Abdominal Pain  History of Present Illness: [name] is a 30 year old woman with a history of recurrent pancreatitis and hepatic steatosis who presents with abdominal pain for two months.  She reports that it is chronic and runs across her epigastric area and to her back.  She also reports associated vomiting, diarrhea, and cold and hot chills at times over these past two months.  However, she has not vomited in the ED.  She reports difficulty keeping food or even water down without vomiting.  No fevers, CP, or SOB.   She has a history of multiple admissions for abdominal pain with and without serologic evidence of pancreatitis since her gallbladder was removed after a bout of pancreatitis in 2008.  She has had multiple past CT scans which never showed any sign of pancreatitis.  I counted 15 past abdomen or pelvis CT scans on our hospital records, all negative for any sign of pancreatitis.  She has had very frequent ED visits, often weekly or more often, for the past five years for various complaints, often abdominal pain.  She presented to the ED yesterday, was found to have a Lipase of 123 and normal LFTs, and was sent home before returning today.  She has a lipid profile in August, with a triglyceride level of 100.    She was admitted with similar symptoms in June and was seen by GI consult Dr. Madilyn Fireman during the admission.  She had an appt with Dr. Gwinda Passe for possible advanced pancreatic/biliary diagnostics with treatment on August 24th at [facility], but she did not go to this  appointment.  She has no current gastroenterologist but has seen several in the past.    She denies taking any tylenol at home recently besides that in percocet.  No rash.  Mild numbness and tingling in hands, which is at baseline.    Meds:  BENADRYL (DIPHENHYDRAMINE) 50 MG, PO, Dose: 1 Tab, q4h, PRN   ESTRADIOL 2MG , PO, Dose: 1 Tab, QHS   ONDANSETRON ODT 4 MG, PO, Dose: 1 Tab, q6h, PRN    Allergies: Celecoxib; Codeine; Ketorolac tromethamine; Meperidine hcl; Morphine; Propoxyphene n-acetaminophen; and Tramadol hcl  Past Medical History  Diagnosis Date  . Pancreatitis: Chronic   . S/P laparoscopic cholecystectomy, gallbladder sludge   . Fibroids, s/p hysterectomy/bilat oophorectomy   Asthma Past episodes of transaminase elevation  Family History: Diabetes: Mother and Father Heart Disease: Father, no MI Stroke: Father  Social History: Smoking: down to 1 pack per week, has smoked for 10 years No alcohol or drug use.  Around people who smoke marijuana, so gets secondhand marijuana smoke. Lives at home with her two children and her cousin who helps her out She is unemployed and does not go to school.  Has medicaid.   Review of Systems: See HPI  Physical Exam: T 97.9, P 83, BP 103/70, R 16, SiO2 99% RA General: alert, well-developed, and cooperative to examination.  Head: normocephalic and atraumatic.  Eyes: vision grossly intact, pupils miotic bilaterally, pupils round, pupils reactive to light, no injection and  anicteric.  Mouth: pharynx pink and moist, no erythema, and no exudates.  Neck: supple, full ROM, no JVD, and no carotid bruits.  Lungs: normal respiratory effort, no accessory muscle use, normal breath sounds, no crackles, and no wheezes. Heart: normal rate, regular rhythm, no murmur, no gallop, and no rub.   Abdomen: Tender to light palpation in abdomen diffusely, not worse in upper quadrants.  Tenderness is mostly distractable.  Soft, normal bowel sounds, no  distention, no guarding, no rebound tenderness.  Msk: no joint swelling, no joint warmth, and no redness over joints.  Pulses: 2+ DP/PT pulses bilaterally Extremities: No cyanosis, clubbing, edema Neurologic: alert & oriented X3, cranial nerves II-XII intact, strength normal in all extremities, sensation intact to light touch. Skin: turgor normal and no rashes.  Psych: Oriented X3, memory intact for recent and remote, normally interactive, good eye contact, not anxious appearing, and not depressed appearing.   Lab results: Basic Metabolic Panel:  Basename 03/17/11 1628 03/16/11 0141  NA 143 139  K 3.5 3.7  CL 106 104  CO2 29 28  GLUCOSE 78 86  BUN 15 13  CREATININE 0.80 0.66  CALCIUM 9.7 8.9  MG -- --  PHOS -- --   Liver Function Tests:  [facility] 03/17/11 1628 03/16/11 0141  AST 482* 14  ALT 367* 19  ALKPHOS 104 79  BILITOT 0.4 0.1*  PROT 7.6 7.4  ALBUMIN 3.7 3.8    Basename 03/17/11 1628 03/16/11 0141  LIPASE 172* 123*  AMYLASE -- --   CBC:  Basename 03/17/11 1628 03/16/11 0141  WBC 6.5 9.2  NEUTROABS 3.4 4.4  HGB 13.1 12.8  HCT 38.1 37.5  MCV 80.7 80.6  PLT 253 265    Urinalysis:  Result Name                               Result     Abnl   Normal Range     Units      Perf. Loc.  Color, Urine                              YELLOW            YELLOW  Appearance                                CLOUDY     a      CLEAR  Specific Gravity                          1.031      h      1.005-1.030  pH                                         5.5               5.0-8.0  Urine Glucose                             NEGATIVE          NEG              mg/dL  Bilirubin  NEGATIVE          NEG  Ketones                                   15         a      NEG              mg/dL  Blood                                      TRACE      a      NEG  Protein                                    NEGATIVE          NEG              mg/dL  Urobilinogen                               1.0               0.0-1.0          mg/dL  Nitrite                                    NEGATIVE          NEG  Leukocytes                                NEGATIVE          NEG  Squamous Epithelial / LPF                MANY       a      RARE  WBC / HPF                                 0-2               <3               WBC/hpf  RBC / HPF                                 0-2               <3               RBC/hpf  Bacteria / HPF                            FEW        a      RARE  Urine-Other                               SEE NOTE.    MUCOUS PRESENT  Wet Prep  Result Name  Result     Abnl   Normal Range     Units      Perf. Loc.  Yeast, Wet Prep                          NONE SEEN         NS  Trichomonads, Wet Prep                   NONE SEEN         NS  Clue Cells, Wet Prep                     FEW        a      NS  WBC, Wet Prep                            NONE SEEN         NS  Lipase 172  Assessment & Plan by Problem: [name] is a 30 year old woman with recurrent pancreatitis, hepatic steatosis, and multiple past episodes of transaminase elevations of unclear etiology who presents with two months of abdominal pain, an elevated lipase, and elevated transaminases.    Abdominal Pain: The etiology of this is unclear.  Elevated lipase and transaminases.  With her elevated lipase, this may be recurrent pancreatitis, but she does have a long history of lipase elevations.  With elevated transaminases, hepatitis needs to be ruled out, as there is no recent hepatitis panel on record. Her abdominal pain is mostly distractable on exam, and she is requesting specific doses of Dilaudid and Benadryl from the start of our interview, which makes narcotic seeking a possible component/etiology (this has been suspected many times in the past).   She also has had lysis of adhesions in the past, making her h/o multiple abdominal/pelvic surgeries a possible etiology as well.  She  has had normal MRCP in June and a past normal EUS.  Dr. Madilyn Fireman, GI consult, saw her during June hospitalization and suspected Sphincter of Oddi Dysfunction, Papillary Stenosis, or Pancrease Divisum, all of which would be possible etiologies of recurrent pancreatitis for her. Gastritis, constipation, and esophageal spasm are also on the differential.    NPO D5NS @ 175 cc/hr Dilaudid IV PRN Zofran IV PRN Benadryl IV PRN Famotidine IV BID Dulcolax Suppository   Elevated Transaminases:  Etiology is unclear.  Acute hepatitis must be ruled out given no recent hepatitis panel on record.  Dr. Madilyn Fireman, GI consult, saw her during June hospitalization and suspected Sphincter of Oddi Dysfunction, Papillary Stenosis, or Pancrease Divisum.  A follow-up for advance diagnostics was scheduled for August 24th at [facility], but she did not go to this appt.  Her hepatic steatosis may also be related.  Unknown toxin/herbal supplement ingestion is also possible.  Acute Hepatitis Panel STAT HIV ab UDS Acetaminophen level ETOH level TSH  Tobacco use Pt declined nicotine patch.  Will obtain smoking cessation consult  DVT ppx: heparin   R2/3______________________________      R1________________________________  ATTENDING: I performed and/or observed a history and physical examination of the patient.  I discussed the case with the residents as noted and reviewed the residents' notes.  I agree with the findings and plan--please refer to the attending physician note for more details.  Signature________________________________  Printed Name_____________________________

## 2011-03-18 DIAGNOSIS — R740 Nonspecific elevation of levels of transaminase and lactic acid dehydrogenase [LDH]: Secondary | ICD-10-CM

## 2011-03-18 DIAGNOSIS — R109 Unspecified abdominal pain: Secondary | ICD-10-CM

## 2011-03-18 LAB — HEPATITIS PANEL, ACUTE
Hep B C IgM: NEGATIVE
Hepatitis B Surface Ag: NEGATIVE

## 2011-03-18 LAB — GC/CHLAMYDIA PROBE AMP, GENITAL
Chlamydia, DNA Probe: NEGATIVE
GC Probe Amp, Genital: NEGATIVE

## 2011-03-18 LAB — COMPREHENSIVE METABOLIC PANEL
ALT: 332 U/L — ABNORMAL HIGH (ref 0–35)
AST: 248 U/L — ABNORMAL HIGH (ref 0–37)
Albumin: 3.6 g/dL (ref 3.5–5.2)
Alkaline Phosphatase: 109 U/L (ref 39–117)
CO2: 21 mEq/L (ref 19–32)
Chloride: 105 mEq/L (ref 96–112)
Creatinine, Ser: 0.58 mg/dL (ref 0.50–1.10)
GFR calc non Af Amer: >60 mL/min (ref >60)
Potassium: 4.3 mEq/L (ref 3.5–5.1)
Sodium: 138 mEq/L (ref 135–145)
Total Bilirubin: 0.5 mg/dL (ref 0.3–1.2)

## 2011-03-18 LAB — PROTIME-INR: Prothrombin Time: 13.7 seconds (ref 11.6–15.2)

## 2011-03-18 LAB — RAPID URINE DRUG SCREEN, HOSP PERFORMED
Amphetamines: NOT DETECTED
Barbiturates: NOT DETECTED
Cocaine: NOT DETECTED
Opiates: NOT DETECTED
Tetrahydrocannabinol: POSITIVE — AB

## 2011-03-19 DIAGNOSIS — R109 Unspecified abdominal pain: Secondary | ICD-10-CM

## 2011-03-19 DIAGNOSIS — R740 Nonspecific elevation of levels of transaminase and lactic acid dehydrogenase [LDH]: Secondary | ICD-10-CM

## 2011-03-19 LAB — HEPATIC FUNCTION PANEL
ALT: 511 U/L — ABNORMAL HIGH (ref 0–35)
Albumin: 3.9 g/dL (ref 3.5–5.2)
Alkaline Phosphatase: 126 U/L — ABNORMAL HIGH (ref 39–117)
Indirect Bilirubin: 0.5 mg/dL (ref 0.3–0.9)
Total Protein: 7.8 g/dL (ref 6.0–8.3)

## 2011-03-19 NOTE — Consult Note (Signed)
NAMEDIERA, WIRKKALA               ACCOUNT NO.:  [id number]  MEDICAL RECORD NO.:  [id number]  LOCATION:  [facility]                         FACILITY:  [facility]  PHYSICIAN:  Jordan Hawks. Elnoria Howard, MD    DATE OF BIRTH:  [birth year]  DATE OF CONSULTATION:  03/18/2011 DATE OF DISCHARGE:                                CONSULTATION   REASON FOR CONSULTATION:  Recurrent pancreatitis and abnormal liver enzymes.  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  HISTORY OF PRESENT ILLNESS:  This is a 30 year old female with a past medical history of recurrent pancreatitis with resultant severe abdominal pain, asthma and uterine fibroids, who is admitted to hospital with complaints of abdominal pain.  The patient reports that it has been an ongoing issue.  Interestingly, she does not have any pain during some intercurrent time and intercurrent periods between the onset of her pancreatitis.  She has had innumerable visits to the emergency room and also admissions for this type of pain in the past.  There has been documented elevations in her transaminases, which correlate with the elevation in her lipase.  Approximately, 15 CT scans have been performed and there has never been any evidence of pancreatitis.  In 2008, she did undergo a laparoscopic cholecystectomy with findings of cholecystitis, but there is no evidence of any stones.  Unfortunately, she continues to have these type of episode.  She is supposed to see Dr. Gwinda Passe at [facility], but she says that she does not have any way to go over to about this, although she did visit a friend in [place].  At that time, she had a bout of her pancreatitis then presented to the emergency room there.  As a result of her symptoms, a GI consultation was requested for further evaluation and treatment.  PAST MEDICAL AND PAST SURGICAL HISTORY:  As stated above.  FAMILY HISTORY:  Noncontributory.  SOCIAL HISTORY:  Negative for alcohol, tobacco, or illicit drug  use.  ALLERGIES:  IBUPROFEN, TRAMADOL, DEMEROL, MORPHINE, ASPIRIN, PROTONIX, KETORALAC, AND ASPIRIN.  MEDICATIONS: 1. Famotidine 20 mg IV q.12 h. 2. Subcu heparin. 3. Bisacodyl 10 mg daily p.r.n. 4. Benadryl 25 mg IV q.6 h. p.r.n. 5. Dilaudid 1-2 mg IV q.4 h. p.r.n. 6. Zofran 4 mg IV q.6 h. p.r.n.  PHYSICAL EXAMINATION:  VITAL SIGNS:  Blood pressure is 118/76, heart rate is 66, respirations 16, temperature is 97.8. GENERAL:  The patient is in no acute distress, but she is uncomfortable. HEENT:  Normocephalic, atraumatic.  Extraocular muscles intact. NECK:  Supple.  No lymphadenopathy. LUNGS:  Clear to auscultation bilaterally. CARDIOVASCULAR:  Regular rate and rhythm. ABDOMEN:  Obese, soft.  Tender in the epigastrium.  No rebound or rigidity.  Positive bowel sounds. EXTREMITIES:  No clubbing, cyanosis, or edema.  LABORATORY VALUES:  White blood cell count 6.5, hemoglobin 13.1, MCV is 80.7, platelets at 253.  PT is 13.7, INR is 1.0, PTT is 34.  Sodium 138, potassium 4.3, chloride 105, CO2 21, glucose 125, BUN 12, creatinine 0.5, total bilirubin 0.5, alk phos 109, AST 248, ALT 332, amylase 104, lipase 172.  IMPRESSION: 1. Acute recurrent pancreatitis. 2. Abnormal liver enzymes.  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  Her pain and recurrent     pancreatitis is consistent with this diagnosis.  Again, she does     not have any pain during these intercurrent periods, which would go     along with this type of finding.  In the past, Dr. Madilyn Fireman did     attempt an endoscopic retrograde cholangiopancreatography, but he     felt to cannulate the common bile duct.  In my personal practice, I     do not perform endoscopic retrograde cholangiopancreatographies in     young females with the diagnosis or presumptive diagnosis of     sphincter of Oddi dysfunction.  It has been shown in the literature     that in this classification of patients,  there is a greater than     50% chance of post endoscopic retrograde cholangiopancreatography     pancreatitis.  [facility] has only sent her to nearby that     performs the biliary manometry.  I think that it would be     worthwhile for her to undergo this type of evaluation.  I did     encourage her to find transportation there.  She is certainly able     to go to [facility] to visit a friend, therefore I believe she can     find transportation to go over to [employer] rather than     continue to have multiple recurrent visits to [facility] Emergency     Room.  PLAN: 1. Supportive care. 2. Pain control. 3. Primary care service to make an appointment with [facility]     [facility].     Jordan Hawks Elnoria Howard, MD     PDH/MEDQ  D:  03/18/2011  T:  03/19/2011  Job:  [id number]  Electronically Signed by Jeani Hawking MD on 03/19/2011 08:50:24 AM

## 2011-03-21 LAB — COMPREHENSIVE METABOLIC PANEL
Alkaline Phosphatase: 112 U/L (ref 39–117)
BUN: 5 mg/dL — ABNORMAL LOW (ref 6–23)
Chloride: 105 mEq/L (ref 96–112)
Creatinine, Ser: 0.7 mg/dL (ref 0.50–1.10)
GFR calc Af Amer: >60 mL/min (ref >60)
Glucose, Bld: 92 mg/dL (ref 70–99)
Potassium: 3.8 mEq/L (ref 3.5–5.1)
Total Bilirubin: 0.3 mg/dL (ref 0.3–1.2)
Total Protein: 6.9 g/dL (ref 6.0–8.3)

## 2011-03-21 LAB — CBC
HCT: 39.4 % (ref 36.0–46.0)
Hemoglobin: 13.4 g/dL (ref 12.0–15.0)
MCHC: 34 g/dL (ref 30.0–36.0)
MCV: 80.9 fL (ref 78.0–100.0)

## 2011-03-25 LAB — COMPREHENSIVE METABOLIC PANEL
ALT: 36 — ABNORMAL HIGH
AST: 36
Albumin: 4
Calcium: 9.2
GFR calc Af Amer: >60
Sodium: 139
Total Protein: 7

## 2011-03-25 LAB — CBC
MCHC: 33.7
Platelets: 389
RDW: 12.6

## 2011-03-25 LAB — PREGNANCY, URINE: Preg Test, Ur: NEGATIVE

## 2011-03-25 LAB — URINALYSIS, ROUTINE W REFLEX MICROSCOPIC
Ketones, ur: NEGATIVE
Nitrite: NEGATIVE
Protein, ur: NEGATIVE

## 2011-03-25 LAB — DIFFERENTIAL
Eosinophils Absolute: 0.4
Lymphs Abs: 2.7
Monocytes Absolute: 0.9
Monocytes Relative: 8
Neutrophils Relative %: 61

## 2011-03-25 LAB — LIPASE, BLOOD: Lipase: 74 — ABNORMAL HIGH

## 2011-03-28 NOTE — Discharge Summary (Signed)
  [name], [name]               ACCOUNT NO.:  [id number]  MEDICAL RECORD NO.:  [id number]  LOCATION:  [facility]                         FACILITY:  [facility]  PHYSICIAN:  Doneen Poisson, MD     DATE OF BIRTH:  04-16-1981  DATE OF ADMISSION:  03/17/2011 DATE OF DISCHARGE:                              DISCHARGE SUMMARY   ADDENDUM  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  She was scheduled for  an ERCP and further intervention on March 24, 2011, but she left against medical  advice on March 23, 2011.  Transportation was listed as a barrier to evaluation and treatment at area tertiary centers but she was able to arrange transportation  back home from [place].  As this is a problem that will recur frequently, and local  options for definitive therapy have been exhausted, [name] should be encouraged  to follow-up with Gastroenterology specialists in the specialty clinics now that  transportation does not appear to be as problematic as initially claimed.  Symptomatic  therapy with patient requested Dilaudid dosing does not appear to be a long-term solution to this chronic recurrent problem.    ______________________________ Almyra Deforest, MD   ______________________________ Doneen Poisson, MD   JI/MEDQ  D:  03/25/2011  T:  03/25/2011  Job:  [id number]  Electronically Signed by Almyra Deforest MD on 03/25/2011 12:14:41 PM Electronically Signed by Doneen Poisson  on 03/28/2011 10:04:52 AM

## 2011-03-28 NOTE — Discharge Summary (Signed)
[name], [name]               ACCOUNT NO.:  [id number]  MEDICAL RECORD NO.:  [id number]  LOCATION:  [facility]                         FACILITY:  [facility]  PHYSICIAN:  Doneen Poisson, MD     DATE OF BIRTH:  1980-08-31  DATE OF ADMISSION:  03/17/2011 DATE OF DISCHARGE:  03/20/2011                              DISCHARGE SUMMARY   ATTENDING PHYSICIAN:  Doneen Poisson, MD  DISCHARGE DIAGNOSES: 1. 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 2. Acute recurrent pancreatitis. 3. Status post laparoscopic cholecystectomy, gallbladder sludge. 4. History of Fibroid, status post hysterectomy/bilateral oophorectomy. 5. History of recurrent episodes of elevated transaminases. 6. Asthma.  DISCHARGE MEDICATIONS: 1. Dilaudid 1-2 mg IV q.4 h. p.r.n. for pain. 2. Zofran 4 mg IV q.6 h. p.r.n. for nausea. 3. Benadryl 25 mg IV q.6 h. p.r.n. for itching. 4. Bisacodyl 10 mg suppositories 10 mg daily p.r.n. for constipation. 5. Pepcid 20 mg IV q.12 h.  HOME MEDICATIONS: 1. Estradiol 20 mg p.o. daily at bedtime. 2. Benadryl 1 mg p.o. q.4 h. p.r.n. for itching. 3. Zofran 4 mg p.o. q.4 h. p.r.n. for nausea.  P.o. mediations stopped during this hospitalizations due to significant nausea, vomiting, abdominal pain.  DISPOSITION AND FOLLOWUP:  [name] will be transferred to [facility] for further evaluation and management of possible sphincter of Oddi dysfunction type 2.  PROCEDURES PERFORMED:   MRI of abdomen without and with contrast on December 13, 2010, including  MRCP findings, there is no evidence of intrahepatic or extrahepatic  biliary dilation.  The CBD is normal in caliber.  No filling defect  within the common bile duct, connection between the ventral and  dorsal pancreatic ducts and pancreas divisum is not excluded.   The pancreas is otherwise unremarkable.  The patient is status post  cholecystectomy.  Probably tiny hepatic cysts are noted.  The spleen,    kidneys, and adrenal glands are unremarkable except for a small right  renal cyst.  There is no evidence of free fluid, enlarged lymph nodes,  or abdominal aortic aneurysm.  No bulla or abnormalities identified.  CONSULTATION:  Dr. Jeani Hawking, Gastroenterology was consulted for recurrent pancreatitis and abnormal liver enzymes who noted that abnormal liver enzymes are most likely due to sphincter of Oddi dysfunction type 2.  Dr. Madilyn Fireman attempted an endoscopic retrograde cholangiopancreatography, but he failed to cannulate the common bile duct.  He concluded that patient would benefit from  biliary manometry and referred [name] to [facility] for further  evaluation and management.  BRIEF ADMISSION HISTORY AND PHYSICAL:  [name] is a 30 year old woman with a history of recurrent pancreatitis and hepatic steatosis who presents with abdominal pain for 2 months.  She reports that it is chronic and runs across her epigastric area and into her back.  She also reports associated vomiting, diarrhea, and cold and hot chills at times over the past 2 months.  However, she has not vomited in the ED.  She reports difficulty keeping food or even water down without vomiting.  No fevers, chest pain, or shortness of breath.  She has a history of multiple admissions for abdominal  pain with and without serologic evidence of pancreatitis and the gallbladder was removed after a bout of pancreatitis in 2008.  She has had multiple past CT scans, which never showed any signs of pancreatitis.  I accounted 15 past abdomen and pelvis CT scans in our hospital records, all negative for any signs of pancreatitis.  She has had very frequent ED visits, often weekly or more often, for the past 5 years for various complaints, often abdominal pain.  She presented to the ED yesterday, was found to have a lipase of 123 and normal LFTs, was sent home before returning today.  She has a lipid profile in August with the  triglyceride level of 100.  She was admitted with similar symptoms in June and was seen by GI, Dr. Madilyn Fireman during the admission.  She had an appointment with Dr. Gwinda Passe for possible pancreatic/biliary diagnostic with treatment on February 26, 2011, at [facility], but she did not go to the appointment due to transportation problems.  She has no current gastroenterologist, but seen them in the past.  She denies taking any Tylenol or Percocet at  home recently.  No rashes.  Mild numbness and tingling in hand, which is  at baseline.  ALLERGIES: 1. CELECOXIB. 2. CODEINE. 3. KETOROLAC. 4. TROMETHAMINE. 5. MEPERIDINE. 6. MORPHINE. 7. PROPOXYPHENE AND ACETAMINOPHEN. 8. TRAMADOL.  FAMILY HISTORY:  Diabetes in mother and father, heart disease and stroke  in father.   SOCIAL HISTORY:  [name] smokes 1 pack a day. Has smoked for 10 years.  She lives at home with her 2 children and her cousin who helps out.   She is unemployed and does not go to school.  She has  Medicaid.  PHYSICAL EXAMINATION: VITAL SIGNS:  Temperature 97.9, pulse 83, blood pressure 103/70,  respirations 16, O2 saturation 99% on room air. GENERAL:  Alert, awake, and cooperative to examination. HEAD:  Normocephalic and atraumatic. EYES:  Vision grossly intact.  Pupils miotic bilaterally.  Pupils are round and reactive to light.  No injection and anicteric. MOUTH/PHARYNX:  Pink and moist.  No erythema or exudate. NECK:  Supple.  Full range of motion.  No JVD or carotid bruits. LUNGS:  Normal respiratory effort.  No accessory muscle use.  Normal breath sounds.  No crackles or wheezes. HEART:  Normal rate and rhythm.  No murmurs, rubs, or gallops. ABDOMEN:  Tender to light palpation of abdomen diffusely.  No worse in upper quadrants.  Tenderness is mostly distractible.  Soft.  Normal bowel sounds.  No distention, guarding or rebound tenderness. MUSCULOSKELETAL:  No joint swelling, warmth, or redness. PULSE:  2+ pulses  bilaterally. EXTREMITIES:  No cyanosis, clubbing, or edema. NEUROLOGIC:  Alert and oriented x3.  Cranial nerves II-XII intact. Strength normal in all extremities.  Sensation is intact to light touch. SKIN:  Skin turgor normal, no rashes. PSYCH:  Oriented x 3.  Memory intact to recent and remote, normally interactive, good eye contact.  Not anxious or depressed appearing.  LABORATORY DATA ON ADMISSION:  WBC count 6.5, hemoglobin 13.1, hematocrit 38.1, platelets 253.  Pregnancy test negative. Sodium 143, potassium 3.5, chloride 106, CO2 29, glucose 78, BUN 15, creatinine 0.8, total bili 0.4, alk phos 104, AST 482, ALT 367, total protein 7.6, albumin 3.7, calcium 9.7, lipase 172.  Wet prep negative.   Urinalysis negative for pyuria.  Tylenol level < 50. Alcohol level < 11.   Amylase 104.  UDS positive for marijuana.  TSH 1.258.  HIV nonreactive.  PT 13.7, PTT 34.  GC/Chlamydia negative.  MRSA by PCR negative.   Hepatitis panel, hepatitis A, B, C negative.  HOSPITAL COURSE: 1. Elevated transaminase, etiology is unclear.  Acute viral hepatitis was     ruled out since hepatic panel was negative.  Dr. Jeani Hawking,     gastroenterologist was consulted who noted possible sphincter of     Oddi dysfunction type 2.  An endoscopic retrograde     cholangiopancreatography was attempted by Dr. Madilyn Fireman in June 2012,     but he was not able to cannulate the common bile duct.  Dr. Elnoria Howard noted     that in the literature in this classification of patient, there is     greater than 50% chance of postendoscopic retrograde     cholangiopancreatographic pancreatitis.  Therefore, he recommended     further evaluation at [facility] for possible biliary     manometry.  2. Abdominal pain, etiology of this is unclear.  Ms. Legacy has     recurrent pancreatitis and elevated transaminases.     Her abdominal pain is mostly diffuse type during exam. She was      made n.p.o. and continues to have abdominal pain.   We will transfer     her to [facility] for further evaluation and management.  3. Tobacco abuse.  Ms. Yordy declined a nicotine patch.  Social Work     was consulted for smoking cessation.  DISCHARGE VITAL SIGNS:  Temperature 98, pulse 81, respiratory rate 18, blood pressure 112/80, O2 sat 100% on room air.  Hepatic function panel, total bili 0.7, direct bili 0.2, indirect bili 0.5, alk phos 126, AST 503, ALT 511, total protein 7.8, albumin 3.9.   ______________________________ Almyra Deforest, MD   ______________________________ Doneen Poisson, MD  JI/MEDQ  D:  03/20/2011  T:  03/20/2011  Job:  [id number]  cc:   Vikki Ports A. Felicity Coyer, MD  Electronically Signed by Almyra Deforest MD on 03/21/2011 05:42:02 PM Electronically Signed by Doneen Poisson  on 03/28/2011 09:56:57 AM

## 2011-03-29 LAB — COMPREHENSIVE METABOLIC PANEL
ALT: 38 — ABNORMAL HIGH
AST: 48 — ABNORMAL HIGH
Albumin: 3.5
Alkaline Phosphatase: 87
Chloride: 109
GFR calc Af Amer: >60
Potassium: 3.7
Sodium: 140
Total Bilirubin: 0.5
Total Protein: 6.7

## 2011-03-29 LAB — LIPID PANEL
LDL Cholesterol: 91
VLDL: 27

## 2011-03-29 LAB — AMYLASE: Amylase: 108

## 2011-03-29 LAB — CBC
HCT: 34.8 — ABNORMAL LOW
HCT: 35.8 — ABNORMAL LOW
Hemoglobin: 11.9 — ABNORMAL LOW
MCHC: 34.1
Platelets: 281
Platelets: 289
RDW: 12.2
RDW: 12.4
WBC: 7.6

## 2011-03-29 LAB — BASIC METABOLIC PANEL
BUN: 9
CO2: 27
Calcium: 8.7
Glucose, Bld: 85
Sodium: 142

## 2011-03-29 LAB — DIFFERENTIAL
Basophils Absolute: 0
Basophils Relative: 1
Eosinophils Relative: 3
Monocytes Absolute: 0.5
Monocytes Relative: 7
Neutro Abs: 5

## 2011-03-30 LAB — CBC
HCT: 37.7
HCT: 38.4
Hemoglobin: 12.9
Hemoglobin: 12.9
Hemoglobin: 14.2
MCHC: 33.8
MCHC: 34
MCHC: 34.3
MCHC: 34.7
MCHC: 34.8
MCV: 81.8
MCV: 82.2
MCV: 82.6
MCV: 80.9
MCV: 81.1
MCV: 81.7
Platelets: 262
Platelets: 354
Platelets: 319
RBC: 4.61
RBC: 4.61
RBC: 4.15
RBC: 4.44
RBC: 5.04
RDW: 12.3
RDW: 12.3
WBC: 12.1 — ABNORMAL HIGH
WBC: 10.6 — ABNORMAL HIGH
WBC: 6.2
WBC: 9.7

## 2011-03-30 LAB — COMPREHENSIVE METABOLIC PANEL
ALT: 49 — ABNORMAL HIGH
ALT: 66 — ABNORMAL HIGH
ALT: 90 — ABNORMAL HIGH
ALT: 185 — ABNORMAL HIGH
ALT: 72 — ABNORMAL HIGH
ALT: 80 — ABNORMAL HIGH
AST: 135 — ABNORMAL HIGH
AST: 28
AST: 126 — ABNORMAL HIGH
AST: 338 — ABNORMAL HIGH
AST: 41 — ABNORMAL HIGH
AST: 44 — ABNORMAL HIGH
Albumin: 3.3 — ABNORMAL LOW
Albumin: 4.4
Albumin: 3.3 — ABNORMAL LOW
Albumin: 4
Alkaline Phosphatase: 92
Alkaline Phosphatase: 98
Alkaline Phosphatase: 101
BUN: 1 — ABNORMAL LOW
BUN: 11
BUN: 2 — ABNORMAL LOW
CO2: 26
CO2: 30
CO2: 25
CO2: 26
CO2: 27
CO2: 29
Calcium: 8.9
Calcium: 9.3
Calcium: 9.3
Calcium: 8.8
Calcium: 8.9
Calcium: 9.2
Chloride: 106
Chloride: 107
Chloride: 109
Chloride: 103
Chloride: 105
Creatinine, Ser: 0.81
Creatinine, Ser: 0.91
Creatinine, Ser: 0.97
Creatinine, Ser: 1.01
Creatinine, Ser: 0.76
Creatinine, Ser: 0.85
Creatinine, Ser: 0.9
GFR calc Af Amer: >60
GFR calc Af Amer: >60
GFR calc Af Amer: >60
GFR calc Af Amer: >60
GFR calc non Af Amer: >60
GFR calc non Af Amer: >60
GFR calc non Af Amer: >60
GFR calc non Af Amer: >60
GFR calc non Af Amer: >60
GFR calc non Af Amer: >60
Glucose, Bld: 106 — ABNORMAL HIGH
Glucose, Bld: 87
Glucose, Bld: 92
Glucose, Bld: 90
Glucose, Bld: 93
Potassium: 3.4 — ABNORMAL LOW
Potassium: 3.7
Potassium: 3.4 — ABNORMAL LOW
Potassium: 4
Sodium: 139
Sodium: 140
Sodium: 141
Sodium: 138
Sodium: 141
Sodium: 141
Total Bilirubin: 0.6
Total Bilirubin: 0.6
Total Bilirubin: 0.7
Total Bilirubin: 0.3
Total Bilirubin: 0.3
Total Protein: 6.4
Total Protein: 7.2
Total Protein: 7.2

## 2011-03-30 LAB — URINE MICROSCOPIC-ADD ON

## 2011-03-30 LAB — URINALYSIS, ROUTINE W REFLEX MICROSCOPIC
Bilirubin Urine: NEGATIVE
Glucose, UA: NEGATIVE
Hgb urine dipstick: NEGATIVE
Ketones, ur: NEGATIVE
Leukocytes, UA: NEGATIVE
Nitrite: NEGATIVE
Nitrite: NEGATIVE
Nitrite: NEGATIVE
Specific Gravity, Urine: 1.026
Specific Gravity, Urine: 1.022
Specific Gravity, Urine: 1.028
Specific Gravity, Urine: 1.031 — ABNORMAL HIGH
Urobilinogen, UA: 0.2
Urobilinogen, UA: 0.2
pH: 7
pH: 5.5

## 2011-03-30 LAB — POCT I-STAT, CHEM 8
BUN: 14
Creatinine, Ser: 0.9
Glucose, Bld: 91
Sodium: 142
TCO2: 27

## 2011-03-30 LAB — DIFFERENTIAL
Basophils Absolute: 0.1
Basophils Absolute: 0.1
Basophils Relative: 1
Eosinophils Absolute: 0.3
Eosinophils Absolute: 0.4
Eosinophils Relative: 2
Eosinophils Relative: 4
Lymphocytes Relative: 26
Lymphocytes Relative: 32
Lymphs Abs: 2.7
Lymphs Abs: 3.2
Monocytes Absolute: 1.1 — ABNORMAL HIGH
Monocytes Absolute: 1
Monocytes Relative: 10
Monocytes Relative: 9
Neutro Abs: 5.2
Neutrophils Relative %: 54
Neutrophils Relative %: 63

## 2011-03-30 LAB — LIPASE, BLOOD
Lipase: 27
Lipase: 28
Lipase: 36

## 2011-03-30 LAB — WET PREP, GENITAL: Trich, Wet Prep: NONE SEEN

## 2011-03-30 LAB — HEPATITIS PANEL, ACUTE
Hep B C IgM: NEGATIVE
Hepatitis B Surface Ag: NEGATIVE

## 2011-03-30 LAB — URINE CULTURE

## 2011-03-30 LAB — ENDOMYSIAL IGA ANTIBODY: Endomysial IgA Autoabs: <1:10 {titer}

## 2011-03-30 LAB — CLOSTRIDIUM DIFFICILE EIA

## 2011-03-30 LAB — RAPID URINE DRUG SCREEN, HOSP PERFORMED
Amphetamines: NOT DETECTED
Benzodiazepines: NOT DETECTED
Cocaine: NOT DETECTED
Tetrahydrocannabinol: NOT DETECTED

## 2011-03-30 LAB — GC/CHLAMYDIA PROBE AMP, GENITAL: GC Probe Amp, Genital: NEGATIVE

## 2011-03-30 LAB — OCCULT BLOOD X 1 CARD TO LAB, STOOL: Fecal Occult Bld: POSITIVE

## 2011-03-30 LAB — TISSUE TRANSGLUTAMINASE, IGA: Tissue Transglutaminase Ab, IgA: 0.5 U/mL (ref <7)

## 2011-03-31 ENCOUNTER — Emergency Department (HOSPITAL_COMMUNITY)
Admission: EM | Admit: 2011-03-31 | Discharge: 2011-04-01 | Disposition: A | Discharge status: Home / Self Care | Payer: Medicaid Other | Attending: Emergency Medicine | Admitting: Emergency Medicine

## 2011-03-31 DIAGNOSIS — R197 Diarrhea, unspecified: Secondary | ICD-10-CM | POA: Insufficient documentation

## 2011-03-31 DIAGNOSIS — R1013 Epigastric pain: Secondary | ICD-10-CM | POA: Insufficient documentation

## 2011-03-31 DIAGNOSIS — R112 Nausea with vomiting, unspecified: Secondary | ICD-10-CM | POA: Insufficient documentation

## 2011-03-31 DIAGNOSIS — K861 Other chronic pancreatitis: Secondary | ICD-10-CM | POA: Insufficient documentation

## 2011-03-31 DIAGNOSIS — Z8619 Personal history of other infectious and parasitic diseases: Secondary | ICD-10-CM | POA: Insufficient documentation

## 2011-03-31 LAB — CBC
HCT: 35.7 — ABNORMAL LOW
MCV: 80.7
RBC: 4.43
WBC: 11 — ABNORMAL HIGH

## 2011-03-31 LAB — COMPREHENSIVE METABOLIC PANEL
BUN: 16
CO2: 28
Chloride: 105
Creatinine, Ser: 0.76
GFR calc non Af Amer: >60
Total Bilirubin: 0.5

## 2011-03-31 LAB — URINALYSIS, ROUTINE W REFLEX MICROSCOPIC
Glucose, UA: NEGATIVE
Hgb urine dipstick: NEGATIVE
Protein, ur: NEGATIVE
Specific Gravity, Urine: 1.029

## 2011-03-31 LAB — DIFFERENTIAL
Basophils Absolute: 0.1
Lymphocytes Relative: 30
Neutro Abs: 5.9

## 2011-03-31 LAB — LIPASE, BLOOD: Lipase: 33

## 2011-04-01 LAB — POCT PREGNANCY, URINE
Operator id: 196461
Operator id: 257131
Preg Test, Ur: NEGATIVE
Preg Test, Ur: NEGATIVE

## 2011-04-01 LAB — CBC
HCT: 34.2 % — ABNORMAL LOW (ref 36.0–46.0)
HCT: 37.9
HCT: 37.9
Hemoglobin: 11.8 g/dL — ABNORMAL LOW (ref 12.0–15.0)
Hemoglobin: 13
Hemoglobin: 13.1
MCHC: 34.5
MCHC: 33.8
MCV: 81.5
MCV: 81.9
MCV: 80.9
MCV: 81.5
MCV: 81.7
Platelets: 313
Platelets: 269
Platelets: 291
RBC: 4.57
RBC: 4.71
RDW: 11.9 % (ref 11.5–15.5)
RDW: 12.3
RDW: 12.4
RDW: 12.9
WBC: 9.6 10*3/uL (ref 4.0–10.5)
WBC: 7.4
WBC: 8.2
WBC: 9.5

## 2011-04-01 LAB — COMPREHENSIVE METABOLIC PANEL
ALT: 18 U/L (ref 0–35)
AST: 13 U/L (ref 0–37)
AST: 19
AST: 24
Albumin: 3.6 g/dL (ref 3.5–5.2)
Albumin: 4
Albumin: 4.1
Alkaline Phosphatase: 71 U/L (ref 39–117)
Alkaline Phosphatase: 84
BUN: 9 mg/dL (ref 6–23)
BUN: 12
BUN: 8
Calcium: 9.1
Calcium: 9.2
Chloride: 110 mEq/L (ref 96–112)
Chloride: 109
Chloride: 110
Creatinine, Ser: 0.68
Creatinine, Ser: 0.72
Creatinine, Ser: 0.78
GFR calc Af Amer: >60
GFR calc Af Amer: >60
Glucose, Bld: 102 — ABNORMAL HIGH
Potassium: 3.6 mEq/L (ref 3.5–5.1)
Potassium: 4.2
Sodium: 143 mEq/L (ref 135–145)
Total Bilirubin: 0.1 mg/dL — ABNORMAL LOW (ref 0.3–1.2)
Total Bilirubin: 0.5
Total Bilirubin: 0.4
Total Protein: 7.2
Total Protein: 6.9
Total Protein: 7.5

## 2011-04-01 LAB — URINALYSIS, ROUTINE W REFLEX MICROSCOPIC
Bilirubin Urine: NEGATIVE
Bilirubin Urine: NEGATIVE
Glucose, UA: NEGATIVE
Glucose, UA: NEGATIVE
Hgb urine dipstick: NEGATIVE
Hgb urine dipstick: NEGATIVE
Ketones, ur: NEGATIVE
Ketones, ur: NEGATIVE
Nitrite: NEGATIVE
Nitrite: NEGATIVE
Protein, ur: NEGATIVE
Protein, ur: NEGATIVE
Protein, ur: NEGATIVE
Specific Gravity, Urine: 1.025
Specific Gravity, Urine: 1.021
Urobilinogen, UA: 1
pH: 6
pH: 6.5
pH: 5.5

## 2011-04-01 LAB — DIFFERENTIAL
Basophils Absolute: 0 10*3/uL (ref 0.0–0.1)
Basophils Absolute: 0
Basophils Absolute: 0
Basophils Relative: 0
Basophils Relative: 0
Eosinophils Absolute: 0.4
Eosinophils Absolute: 0.5
Eosinophils Relative: 3 % (ref 0–5)
Eosinophils Relative: 4
Eosinophils Relative: 3
Eosinophils Relative: 5
Lymphocytes Relative: 40 % (ref 12–46)
Lymphocytes Relative: 29
Lymphocytes Relative: 33
Lymphocytes Relative: 28
Lymphocytes Relative: 31
Lymphs Abs: 2.2
Lymphs Abs: 2.7
Lymphs Abs: 2.8
Lymphs Abs: 3
Monocytes Absolute: 0.5
Monocytes Absolute: 0.7
Monocytes Absolute: 0.8
Monocytes Relative: 9
Monocytes Relative: 6
Monocytes Relative: 7
Neutro Abs: 4.9 10*3/uL (ref 1.7–7.7)
Neutro Abs: 5.8
Neutro Abs: 3.9
Neutro Abs: 4.6
Neutro Abs: 5.4
Neutrophils Relative %: 59
Neutrophils Relative %: 54

## 2011-04-01 LAB — LIPASE, BLOOD
Lipase: 73 U/L — ABNORMAL HIGH (ref 11–59)
Lipase: 29
Lipase: 42

## 2011-04-01 LAB — POCT I-STAT, CHEM 8
BUN: 12
Calcium, Ion: 1.21
Calcium, Ion: 0.94 — ABNORMAL LOW
Creatinine, Ser: 1.7 — ABNORMAL HIGH
Glucose, Bld: 95
HCT: 39
Hemoglobin: 11.6 — ABNORMAL LOW
Sodium: 139
Sodium: 138
TCO2: 24
TCO2: 21
TCO2: 33

## 2011-04-01 LAB — GC/CHLAMYDIA PROBE AMP, GENITAL: GC Probe Amp, Genital: NEGATIVE

## 2011-04-01 LAB — URINE MICROSCOPIC-ADD ON

## 2011-04-01 LAB — AMYLASE: Amylase: 135 — ABNORMAL HIGH

## 2011-04-02 LAB — COMPREHENSIVE METABOLIC PANEL
ALT: 20
ALT: 228 — ABNORMAL HIGH
ALT: 116 — ABNORMAL HIGH
AST: 21
AST: 36
Albumin: 4
Alkaline Phosphatase: 90
Alkaline Phosphatase: 109
BUN: 12
CO2: 26
CO2: 24
Calcium: 9.3
Calcium: 9.4
Chloride: 106
Chloride: 109
Creatinine, Ser: 0.76
Creatinine, Ser: 0.82
Creatinine, Ser: 0.67
GFR calc Af Amer: >60
GFR calc Af Amer: >60
GFR calc Af Amer: >60
GFR calc non Af Amer: >60
GFR calc non Af Amer: >60
GFR calc non Af Amer: >60
Glucose, Bld: 104 — ABNORMAL HIGH
Glucose, Bld: 93
Potassium: 3.8
Sodium: 140
Sodium: 141
Total Bilirubin: 0.6
Total Bilirubin: 0.7
Total Protein: 7.5
Total Protein: 7.5

## 2011-04-02 LAB — DIFFERENTIAL
Basophils Absolute: 0
Basophils Absolute: 0
Basophils Absolute: 0
Basophils Relative: 0
Basophils Relative: 1
Eosinophils Absolute: 0.5
Eosinophils Absolute: 0.5
Eosinophils Absolute: 0.5
Eosinophils Relative: 6 — ABNORMAL HIGH
Eosinophils Relative: 6 — ABNORMAL HIGH
Lymphocytes Relative: 28
Lymphocytes Relative: 32
Lymphocytes Relative: 30
Lymphs Abs: 2.5
Lymphs Abs: 2.6
Monocytes Absolute: 0.7
Monocytes Relative: 10
Monocytes Relative: 8
Neutro Abs: 4.8
Neutrophils Relative %: 52
Neutrophils Relative %: 55

## 2011-04-02 LAB — URINALYSIS, ROUTINE W REFLEX MICROSCOPIC
Bilirubin Urine: NEGATIVE
Bilirubin Urine: NEGATIVE
Glucose, UA: NEGATIVE
Glucose, UA: NEGATIVE
Hgb urine dipstick: NEGATIVE
Ketones, ur: NEGATIVE
Ketones, ur: NEGATIVE
Nitrite: NEGATIVE
Nitrite: NEGATIVE
Nitrite: NEGATIVE
Protein, ur: NEGATIVE
Protein, ur: NEGATIVE
Specific Gravity, Urine: 1.027
Urobilinogen, UA: 0.2
Urobilinogen, UA: 0.2
pH: 6
pH: 5.5

## 2011-04-02 LAB — CBC
HCT: 38.3
HCT: 39.9
Hemoglobin: 12.9
Hemoglobin: 13.3
MCHC: 33.7
MCHC: 33.4
MCV: 80.8
MCV: 81.3
MCV: 81.9
Platelets: 320
RBC: 4.74
RBC: 4.87
RDW: 12.6
RDW: 12.3
WBC: 9
WBC: 8.6

## 2011-04-02 LAB — URINE MICROSCOPIC-ADD ON

## 2011-04-02 LAB — LIPASE, BLOOD
Lipase: 34
Lipase: 35

## 2011-04-03 ENCOUNTER — Emergency Department (HOSPITAL_COMMUNITY)
Admission: EM | Admit: 2011-04-03 | Discharge: 2011-04-04 | Disposition: A | Discharge status: Home / Self Care | Payer: Medicaid Other | Attending: Emergency Medicine | Admitting: Emergency Medicine

## 2011-04-03 DIAGNOSIS — K861 Other chronic pancreatitis: Secondary | ICD-10-CM | POA: Insufficient documentation

## 2011-04-03 DIAGNOSIS — J45909 Unspecified asthma, uncomplicated: Secondary | ICD-10-CM | POA: Insufficient documentation

## 2011-04-03 DIAGNOSIS — R1012 Left upper quadrant pain: Secondary | ICD-10-CM | POA: Insufficient documentation

## 2011-04-03 DIAGNOSIS — R10816 Epigastric abdominal tenderness: Secondary | ICD-10-CM | POA: Insufficient documentation

## 2011-04-03 DIAGNOSIS — R112 Nausea with vomiting, unspecified: Secondary | ICD-10-CM | POA: Insufficient documentation

## 2011-04-03 LAB — CBC
MCH: 27.3 pg (ref 26.0–34.0)
MCV: 79.8 fL (ref 78.0–100.0)
Platelets: 274 10*3/uL (ref 150–400)
RDW: 12 % (ref 11.5–15.5)
WBC: 8.6 10*3/uL (ref 4.0–10.5)

## 2011-04-03 LAB — DIFFERENTIAL
Basophils Relative: 0 % (ref 0–1)
Eosinophils Absolute: 0.4 10*3/uL (ref 0.0–0.7)
Eosinophils Relative: 4 % (ref 0–5)
Lymphs Abs: 3.4 10*3/uL (ref 0.7–4.0)
Monocytes Relative: 6 % (ref 3–12)
Neutrophils Relative %: 50 % (ref 43–77)

## 2011-04-04 LAB — COMPREHENSIVE METABOLIC PANEL
ALT: 48 U/L — ABNORMAL HIGH (ref 0–35)
AST: 23 U/L (ref 0–37)
Albumin: 3.8 g/dL (ref 3.5–5.2)
Calcium: 9.1 mg/dL (ref 8.4–10.5)
Creatinine, Ser: 0.68 mg/dL (ref 0.50–1.10)
Sodium: 139 mEq/L (ref 135–145)
Total Protein: 7.3 g/dL (ref 6.0–8.3)

## 2011-04-05 LAB — COMPREHENSIVE METABOLIC PANEL
ALT: 41 — ABNORMAL HIGH
AST: 82 — ABNORMAL HIGH
Albumin: 4.2
Albumin: 4
Alkaline Phosphatase: 107
Alkaline Phosphatase: 77
Alkaline Phosphatase: 83
BUN: 10
BUN: 12
BUN: 9
BUN: 9
CO2: 26
CO2: 27
Calcium: 9.2
Chloride: 104
Chloride: 107
Creatinine, Ser: 0.75
GFR calc Af Amer: >60
GFR calc Af Amer: >60
GFR calc non Af Amer: >60
GFR calc non Af Amer: >60
Glucose, Bld: 83
Glucose, Bld: 83
Glucose, Bld: 90
Potassium: 3.7
Potassium: 3.4 — ABNORMAL LOW
Potassium: 4.2
Sodium: 140
Total Bilirubin: 0.6
Total Bilirubin: 0.6
Total Bilirubin: 0.8
Total Protein: 7.7
Total Protein: 7.4

## 2011-04-05 LAB — URINE MICROSCOPIC-ADD ON

## 2011-04-05 LAB — PREGNANCY, URINE
Preg Test, Ur: NEGATIVE
Preg Test, Ur: NEGATIVE

## 2011-04-05 LAB — URINALYSIS, ROUTINE W REFLEX MICROSCOPIC
Glucose, UA: NEGATIVE
Glucose, UA: NEGATIVE
Ketones, ur: NEGATIVE
Protein, ur: NEGATIVE
Protein, ur: NEGATIVE
Protein, ur: NEGATIVE
Specific Gravity, Urine: 1.026
Specific Gravity, Urine: 1.023
Urobilinogen, UA: 0.2
Urobilinogen, UA: 0.2
pH: 8
pH: 5.5
pH: 7.5

## 2011-04-05 LAB — DIFFERENTIAL
Basophils Absolute: 0
Basophils Absolute: 0.1
Basophils Relative: 1
Basophils Relative: 1
Basophils Relative: 1
Eosinophils Absolute: 0.5
Lymphocytes Relative: 25
Monocytes Absolute: 0.8
Monocytes Absolute: 0.7
Monocytes Relative: 9
Neutro Abs: 4.9
Neutro Abs: 4.4
Neutro Abs: 4.5
Neutrophils Relative %: 53
Neutrophils Relative %: 56

## 2011-04-05 LAB — RAPID URINE DRUG SCREEN, HOSP PERFORMED
Barbiturates: NOT DETECTED
Benzodiazepines: NOT DETECTED
Cocaine: NOT DETECTED
Opiates: NOT DETECTED

## 2011-04-05 LAB — CBC
HCT: 41.9
HCT: 38.4
HCT: 38.7
Hemoglobin: 12.9
Hemoglobin: 12.9
Platelets: 298
RBC: 4.66
RDW: 12.4
RDW: 12.1
WBC: 8

## 2011-04-05 LAB — LIPASE, BLOOD: Lipase: 24

## 2011-04-05 LAB — POCT PREGNANCY, URINE: Preg Test, Ur: NEGATIVE

## 2011-04-06 ENCOUNTER — Emergency Department (HOSPITAL_COMMUNITY): Payer: Medicaid Other

## 2011-04-06 ENCOUNTER — Emergency Department (HOSPITAL_COMMUNITY)
Admission: EM | Admit: 2011-04-06 | Discharge: 2011-04-07 | Discharge status: Against Medical Advice | Payer: Medicaid Other | Attending: Emergency Medicine | Admitting: Emergency Medicine

## 2011-04-06 DIAGNOSIS — R1013 Epigastric pain: Secondary | ICD-10-CM | POA: Insufficient documentation

## 2011-04-06 DIAGNOSIS — Z8619 Personal history of other infectious and parasitic diseases: Secondary | ICD-10-CM | POA: Insufficient documentation

## 2011-04-06 DIAGNOSIS — H6690 Otitis media, unspecified, unspecified ear: Secondary | ICD-10-CM | POA: Insufficient documentation

## 2011-04-06 DIAGNOSIS — R112 Nausea with vomiting, unspecified: Secondary | ICD-10-CM | POA: Insufficient documentation

## 2011-04-06 DIAGNOSIS — K861 Other chronic pancreatitis: Secondary | ICD-10-CM | POA: Insufficient documentation

## 2011-04-06 LAB — COMPREHENSIVE METABOLIC PANEL
ALT: 21
AST: 24
CO2: 25
Calcium: 9.1
Chloride: 109
GFR calc Af Amer: >60
GFR calc non Af Amer: >60
Glucose, Bld: 84
Sodium: 141
Total Bilirubin: 0.5

## 2011-04-06 LAB — URINE MICROSCOPIC-ADD ON

## 2011-04-06 LAB — URINALYSIS, ROUTINE W REFLEX MICROSCOPIC
Bilirubin Urine: NEGATIVE
Ketones, ur: 15 — AB
Nitrite: NEGATIVE
Protein, ur: NEGATIVE
pH: 6

## 2011-04-06 LAB — CBC
Hemoglobin: 12.9
MCHC: 34.1
MCV: 82.2
RBC: 4.62
WBC: 9.4

## 2011-04-06 LAB — DIFFERENTIAL
Basophils Absolute: 0.1
Basophils Relative: 1
Eosinophils Absolute: 0.5
Eosinophils Relative: 5
Neutrophils Relative %: 58

## 2011-04-06 LAB — LIPASE, BLOOD: Lipase: 38

## 2011-04-06 LAB — POCT PREGNANCY, URINE: Preg Test, Ur: NEGATIVE

## 2011-04-06 LAB — OCCULT BLOOD X 1 CARD TO LAB, STOOL: Fecal Occult Bld: NEGATIVE

## 2011-04-07 LAB — COMPREHENSIVE METABOLIC PANEL
ALT: 32
AST: 17 U/L (ref 0–37)
AST: 22
AST: 21
Albumin: 3.8
Albumin: 3.8
Alkaline Phosphatase: 87
CO2: 25 mEq/L (ref 19–32)
CO2: 26
Calcium: 8.9 mg/dL (ref 8.4–10.5)
Calcium: 9.1
Chloride: 107
Creatinine, Ser: 0.68 mg/dL (ref 0.50–1.10)
Creatinine, Ser: 0.78
GFR calc Af Amer: >90 mL/min (ref >90)
GFR calc Af Amer: >60
GFR calc Af Amer: >60
GFR calc non Af Amer: >90 mL/min (ref >90)
GFR calc non Af Amer: >60
Potassium: 3.9
Sodium: 138
Total Bilirubin: 0.3
Total Protein: 7.3 g/dL (ref 6.0–8.3)
Total Protein: 6.9

## 2011-04-07 LAB — CBC
HCT: 39.3
MCH: 27.9 pg (ref 26.0–34.0)
MCHC: 34.5 g/dL (ref 30.0–36.0)
MCHC: 33.7
MCV: 80.8 fL (ref 78.0–100.0)
MCV: 82.6
Platelets: 252 10*3/uL (ref 150–400)
Platelets: 305
Platelets: 280
RBC: 4.63 MIL/uL (ref 3.87–5.11)
RBC: 4.74
RBC: 4.48
RDW: 12.3
WBC: 9.8

## 2011-04-07 LAB — URINALYSIS, ROUTINE W REFLEX MICROSCOPIC
Bilirubin Urine: NEGATIVE
Bilirubin Urine: NEGATIVE
Hgb urine dipstick: NEGATIVE
Hgb urine dipstick: NEGATIVE
Nitrite: NEGATIVE
Protein, ur: NEGATIVE
Protein, ur: NEGATIVE
Specific Gravity, Urine: 1.024
Urobilinogen, UA: 0.2
Urobilinogen, UA: 1

## 2011-04-07 LAB — DIFFERENTIAL
Basophils Absolute: 0 10*3/uL (ref 0.0–0.1)
Basophils Absolute: 0.1
Basophils Relative: 1
Eosinophils Absolute: 0.3 10*3/uL (ref 0.0–0.7)
Eosinophils Absolute: 0.8 — ABNORMAL HIGH
Eosinophils Absolute: 0.5
Eosinophils Relative: 8 — ABNORMAL HIGH
Eosinophils Relative: 5
Lymphocytes Relative: 31
Lymphs Abs: 3.8 10*3/uL (ref 0.7–4.0)
Lymphs Abs: 2.7
Monocytes Absolute: 0.4 10*3/uL (ref 0.1–1.0)
Monocytes Absolute: 0.8
Monocytes Relative: 8
Neutro Abs: 4.9 10*3/uL (ref 1.7–7.7)

## 2011-04-07 LAB — POCT PREGNANCY, URINE: Preg Test, Ur: NEGATIVE

## 2011-04-07 LAB — LIPASE, BLOOD: Lipase: 43

## 2011-04-09 LAB — BASIC METABOLIC PANEL
BUN: 7 mg/dL (ref 6–23)
CO2: 28 mEq/L (ref 19–32)
Calcium: 8.9 mg/dL (ref 8.4–10.5)
Chloride: 104 mEq/L (ref 96–112)
Chloride: 110 mEq/L (ref 96–112)
Creatinine, Ser: 0.75 mg/dL (ref 0.4–1.2)
GFR calc Af Amer: >60 mL/min (ref >60)
GFR calc non Af Amer: >60 mL/min (ref >60)
Glucose, Bld: 83 mg/dL (ref 70–99)
Glucose, Bld: 97 mg/dL (ref 70–99)
Potassium: 4 mEq/L (ref 3.5–5.1)
Sodium: 141 mEq/L (ref 135–145)

## 2011-04-09 LAB — DIFFERENTIAL
Basophils Absolute: 0 10*3/uL (ref 0.0–0.1)
Basophils Absolute: 0.1 10*3/uL (ref 0.0–0.1)
Basophils Absolute: 0.1 10*3/uL (ref 0.0–0.1)
Basophils Relative: 1 % (ref 0–1)
Basophils Relative: 1 % (ref 0–1)
Basophils Relative: 1 % (ref 0–1)
Eosinophils Absolute: 0.6 10*3/uL (ref 0.0–0.7)
Eosinophils Absolute: 0.6 10*3/uL (ref 0.0–0.7)
Eosinophils Absolute: 0.7 10*3/uL (ref 0.0–0.7)
Eosinophils Relative: 6 — ABNORMAL HIGH
Lymphocytes Relative: 27 % (ref 12–46)
Lymphocytes Relative: 34
Lymphs Abs: 2.7 10*3/uL (ref 0.7–4.0)
Lymphs Abs: 2.8 10*3/uL (ref 0.7–4.0)
Lymphs Abs: 2.9
Monocytes Absolute: 0.6 10*3/uL (ref 0.1–1.0)
Monocytes Absolute: 0.6 10*3/uL (ref 0.1–1.0)
Monocytes Relative: 7 % (ref 3–12)
Monocytes Relative: 8 % (ref 3–12)
Monocytes Relative: 9 % (ref 3–12)
Monocytes Relative: 7
Neutro Abs: 2.5 10*3/uL (ref 1.7–7.7)
Neutro Abs: 4.7 10*3/uL (ref 1.7–7.7)
Neutro Abs: 5 10*3/uL (ref 1.7–7.7)
Neutrophils Relative %: 53 % (ref 43–77)
Neutrophils Relative %: 54 % (ref 43–77)

## 2011-04-09 LAB — COMPREHENSIVE METABOLIC PANEL
ALT: 35
AST: 25
Albumin: 2.9 g/dL — ABNORMAL LOW (ref 3.5–5.2)
Albumin: 3.2 g/dL — ABNORMAL LOW (ref 3.5–5.2)
Albumin: 3.9 g/dL (ref 3.5–5.2)
Alkaline Phosphatase: 102 U/L (ref 39–117)
Alkaline Phosphatase: 105 U/L (ref 39–117)
Alkaline Phosphatase: 87 U/L (ref 39–117)
BUN: 14 mg/dL (ref 6–23)
BUN: 14 mg/dL (ref 6–23)
BUN: 8 mg/dL (ref 6–23)
BUN: 9 mg/dL (ref 6–23)
CO2: 26 mEq/L (ref 19–32)
CO2: 29 mEq/L (ref 19–32)
CO2: 27
Calcium: 8.3 mg/dL — ABNORMAL LOW (ref 8.4–10.5)
Calcium: 8.9 mg/dL (ref 8.4–10.5)
Calcium: 9.1 mg/dL (ref 8.4–10.5)
Calcium: 9
Chloride: 105 mEq/L (ref 96–112)
Chloride: 107 mEq/L (ref 96–112)
Creatinine, Ser: 0.68 mg/dL (ref 0.4–1.2)
Creatinine, Ser: 0.76 mg/dL (ref 0.4–1.2)
Creatinine, Ser: 0.85
GFR calc Af Amer: >60
GFR calc non Af Amer: >60 mL/min (ref >60)
GFR calc non Af Amer: >60 mL/min (ref >60)
GFR calc non Af Amer: >60
Glucose, Bld: 86 mg/dL (ref 70–99)
Glucose, Bld: 91 mg/dL (ref 70–99)
Glucose, Bld: 92 mg/dL (ref 70–99)
Potassium: 3.6 mEq/L (ref 3.5–5.1)
Potassium: 3.7 mEq/L (ref 3.5–5.1)
Potassium: 3.9 mEq/L (ref 3.5–5.1)
Sodium: 139 mEq/L (ref 135–145)
Sodium: 141
Total Bilirubin: 0.6 mg/dL (ref 0.3–1.2)
Total Bilirubin: 0.7 mg/dL (ref 0.3–1.2)
Total Protein: 5.8 g/dL — ABNORMAL LOW (ref 6.0–8.3)
Total Protein: 7.2 g/dL (ref 6.0–8.3)
Total Protein: 7.1

## 2011-04-09 LAB — URINALYSIS, ROUTINE W REFLEX MICROSCOPIC
Bilirubin Urine: NEGATIVE
Glucose, UA: NEGATIVE mg/dL
Glucose, UA: NEGATIVE mg/dL
Glucose, UA: NEGATIVE
Hgb urine dipstick: NEGATIVE
Ketones, ur: NEGATIVE mg/dL
Ketones, ur: NEGATIVE
Nitrite: NEGATIVE
Nitrite: NEGATIVE
Protein, ur: NEGATIVE mg/dL
Specific Gravity, Urine: 1.029 (ref 1.005–1.030)
Urobilinogen, UA: 0.2 mg/dL (ref 0.0–1.0)
pH: 6 (ref 5.0–8.0)
pH: 8 (ref 5.0–8.0)
pH: 6.5

## 2011-04-09 LAB — HEPATIC FUNCTION PANEL
AST: 24 U/L (ref 0–37)
Albumin: 3.2 g/dL — ABNORMAL LOW (ref 3.5–5.2)
Albumin: 4 g/dL (ref 3.5–5.2)
Alkaline Phosphatase: 91 U/L (ref 39–117)
Total Protein: 6.6 g/dL (ref 6.0–8.3)
Total Protein: 7.5 g/dL (ref 6.0–8.3)

## 2011-04-09 LAB — CBC
HCT: 32.7 % — ABNORMAL LOW (ref 36.0–46.0)
HCT: 38.7 % (ref 36.0–46.0)
HCT: 40.4 % (ref 36.0–46.0)
HCT: 40.6 % (ref 36.0–46.0)
Hemoglobin: 11 g/dL — ABNORMAL LOW (ref 12.0–15.0)
Hemoglobin: 11.9 g/dL — ABNORMAL LOW (ref 12.0–15.0)
Hemoglobin: 13 g/dL (ref 12.0–15.0)
Hemoglobin: 13.5 g/dL (ref 12.0–15.0)
Hemoglobin: 13.6 g/dL (ref 12.0–15.0)
MCHC: 33.3 g/dL (ref 30.0–36.0)
MCHC: 33.4 g/dL (ref 30.0–36.0)
MCHC: 33.5 g/dL (ref 30.0–36.0)
MCHC: 34.8
MCV: 83.2 fL (ref 78.0–100.0)
MCV: 83.3 fL (ref 78.0–100.0)
MCV: 83.8 fL (ref 78.0–100.0)
MCV: 79.5
Platelets: 229 10*3/uL (ref 150–400)
Platelets: 288 10*3/uL (ref 150–400)
Platelets: 319
RBC: 4.65 MIL/uL (ref 3.87–5.11)
RBC: 4.46
RDW: 12.2 % (ref 11.5–15.5)
RDW: 12.4 % (ref 11.5–15.5)
RDW: 12.8 % (ref 11.5–15.5)
RDW: 13 % (ref 11.5–15.5)
RDW: 12.4
WBC: 8.9 10*3/uL (ref 4.0–10.5)
WBC: 9.9 10*3/uL (ref 4.0–10.5)

## 2011-04-09 LAB — HEPATITIS PANEL, ACUTE
Hep A IgM: NEGATIVE
Hep B C IgM: NEGATIVE
Hepatitis B Surface Ag: NEGATIVE

## 2011-04-09 LAB — POCT PREGNANCY, URINE: Preg Test, Ur: NEGATIVE

## 2011-04-09 LAB — RENAL FUNCTION PANEL
BUN: 6 mg/dL (ref 6–23)
CO2: 26 mEq/L (ref 19–32)
Calcium: 8.6 mg/dL (ref 8.4–10.5)
Creatinine, Ser: 0.67 mg/dL (ref 0.4–1.2)
Glucose, Bld: 81 mg/dL (ref 70–99)
Phosphorus: 5.3 mg/dL — ABNORMAL HIGH (ref 2.3–4.6)
Sodium: 139 mEq/L (ref 135–145)

## 2011-04-09 LAB — GLUCOSE, CAPILLARY: Glucose-Capillary: 235 mg/dL — ABNORMAL HIGH (ref 70–99)

## 2011-04-09 LAB — PREGNANCY, URINE
Preg Test, Ur: NEGATIVE
Preg Test, Ur: NEGATIVE

## 2011-04-09 LAB — URINE MICROSCOPIC-ADD ON

## 2011-04-09 LAB — RAPID URINE DRUG SCREEN, HOSP PERFORMED
Barbiturates: NOT DETECTED
Cocaine: NOT DETECTED
Opiates: POSITIVE — AB

## 2011-04-09 LAB — LIPASE, BLOOD
Lipase: 33 U/L (ref 11–59)
Lipase: 42 U/L (ref 11–59)
Lipase: 70 U/L — ABNORMAL HIGH (ref 11–59)

## 2011-04-10 ENCOUNTER — Emergency Department (HOSPITAL_COMMUNITY)
Admission: EM | Admit: 2011-04-10 | Discharge: 2011-04-10 | Disposition: A | Discharge status: Home / Self Care | Payer: Medicaid Other | Attending: Emergency Medicine | Admitting: Emergency Medicine

## 2011-04-10 DIAGNOSIS — R1032 Left lower quadrant pain: Secondary | ICD-10-CM | POA: Insufficient documentation

## 2011-04-10 DIAGNOSIS — R197 Diarrhea, unspecified: Secondary | ICD-10-CM | POA: Insufficient documentation

## 2011-04-10 DIAGNOSIS — K861 Other chronic pancreatitis: Secondary | ICD-10-CM | POA: Insufficient documentation

## 2011-04-10 DIAGNOSIS — K759 Inflammatory liver disease, unspecified: Secondary | ICD-10-CM | POA: Insufficient documentation

## 2011-04-10 DIAGNOSIS — R748 Abnormal levels of other serum enzymes: Secondary | ICD-10-CM | POA: Insufficient documentation

## 2011-04-10 DIAGNOSIS — R1031 Right lower quadrant pain: Secondary | ICD-10-CM | POA: Insufficient documentation

## 2011-04-10 DIAGNOSIS — R112 Nausea with vomiting, unspecified: Secondary | ICD-10-CM | POA: Insufficient documentation

## 2011-04-10 LAB — COMPREHENSIVE METABOLIC PANEL
ALT: 34 U/L (ref 0–35)
Alkaline Phosphatase: 82 U/L (ref 39–117)
CO2: 29 mEq/L (ref 19–32)
Chloride: 104 mEq/L (ref 96–112)
GFR calc Af Amer: >90 mL/min (ref >90)
GFR calc non Af Amer: >90 mL/min (ref >90)
Glucose, Bld: 90 mg/dL (ref 70–99)
Potassium: 3.9 mEq/L (ref 3.5–5.1)
Sodium: 140 mEq/L (ref 135–145)
Total Bilirubin: 0.2 mg/dL — ABNORMAL LOW (ref 0.3–1.2)
Total Protein: 7.8 g/dL (ref 6.0–8.3)

## 2011-04-10 LAB — DIFFERENTIAL
Basophils Absolute: 0 10*3/uL (ref 0.0–0.1)
Basophils Relative: 1 % (ref 0–1)
Lymphocytes Relative: 40 % (ref 12–46)
Neutro Abs: 3.5 10*3/uL (ref 1.7–7.7)
Neutrophils Relative %: 49 % (ref 43–77)

## 2011-04-10 LAB — URINALYSIS, ROUTINE W REFLEX MICROSCOPIC
Glucose, UA: NEGATIVE mg/dL
Hgb urine dipstick: NEGATIVE
Protein, ur: NEGATIVE mg/dL

## 2011-04-10 LAB — CBC
Hemoglobin: 13.1 g/dL (ref 12.0–15.0)
RBC: 4.77 MIL/uL (ref 3.87–5.11)
WBC: 7.2 10*3/uL (ref 4.0–10.5)

## 2011-04-10 LAB — URINE MICROSCOPIC-ADD ON

## 2011-04-12 ENCOUNTER — Emergency Department (HOSPITAL_COMMUNITY)
Admission: EM | Admit: 2011-04-12 | Discharge: 2011-04-12 | Disposition: A | Discharge status: Home / Self Care | Payer: Medicaid Other | Attending: Emergency Medicine | Admitting: Emergency Medicine

## 2011-04-12 DIAGNOSIS — R109 Unspecified abdominal pain: Secondary | ICD-10-CM | POA: Insufficient documentation

## 2011-04-12 DIAGNOSIS — K861 Other chronic pancreatitis: Secondary | ICD-10-CM | POA: Insufficient documentation

## 2011-04-12 DIAGNOSIS — R110 Nausea: Secondary | ICD-10-CM | POA: Insufficient documentation

## 2011-04-12 DIAGNOSIS — R197 Diarrhea, unspecified: Secondary | ICD-10-CM | POA: Insufficient documentation

## 2011-04-12 DIAGNOSIS — J45909 Unspecified asthma, uncomplicated: Secondary | ICD-10-CM | POA: Insufficient documentation

## 2011-04-12 LAB — COMPREHENSIVE METABOLIC PANEL
ALT: 117 U/L — ABNORMAL HIGH (ref 0–35)
AST: 44 U/L — ABNORMAL HIGH (ref 0–37)
Alkaline Phosphatase: 98 U/L (ref 39–117)
CO2: 26 mEq/L (ref 19–32)
Chloride: 103 mEq/L (ref 96–112)
GFR calc non Af Amer: >90 mL/min (ref >90)
Sodium: 140 mEq/L (ref 135–145)
Total Bilirubin: 0.2 mg/dL — ABNORMAL LOW (ref 0.3–1.2)

## 2011-04-12 LAB — DIFFERENTIAL
Basophils Relative: 1 % (ref 0–1)
Eosinophils Absolute: 0.6 10*3/uL (ref 0.0–0.7)
Lymphs Abs: 3.4 10*3/uL (ref 0.7–4.0)
Neutrophils Relative %: 48 % (ref 43–77)

## 2011-04-12 LAB — CBC
MCV: 81.1 fL (ref 78.0–100.0)
Platelets: 269 10*3/uL (ref 150–400)
RBC: 4.71 MIL/uL (ref 3.87–5.11)
WBC: 8.9 10*3/uL (ref 4.0–10.5)

## 2011-04-12 LAB — URINE MICROSCOPIC-ADD ON

## 2011-04-12 LAB — URINALYSIS, ROUTINE W REFLEX MICROSCOPIC
Glucose, UA: NEGATIVE mg/dL
Ketones, ur: NEGATIVE mg/dL
Protein, ur: 30 mg/dL — AB

## 2011-04-13 LAB — DIFFERENTIAL
Basophils Relative: 1
Eosinophils Absolute: 0.5
Eosinophils Relative: 6 — ABNORMAL HIGH
Monocytes Absolute: 0.7
Monocytes Relative: 7
Neutrophils Relative %: 54

## 2011-04-13 LAB — CBC
Hemoglobin: 12.9
RBC: 4.67
RDW: 12.7

## 2011-04-13 LAB — URINE CULTURE
Colony Count: >=100000
Culture  Setup Time: 201210081724

## 2011-04-13 LAB — URINALYSIS, ROUTINE W REFLEX MICROSCOPIC
Bilirubin Urine: NEGATIVE
Glucose, UA: NEGATIVE
Hgb urine dipstick: NEGATIVE
Ketones, ur: NEGATIVE
Protein, ur: NEGATIVE
pH: 7

## 2011-04-13 LAB — COMPREHENSIVE METABOLIC PANEL
ALT: 213 — ABNORMAL HIGH
AST: 66 — ABNORMAL HIGH
Alkaline Phosphatase: 102
CO2: 28
GFR calc Af Amer: >60
Glucose, Bld: 110 — ABNORMAL HIGH
Potassium: 4.2
Sodium: 140
Total Protein: 7

## 2011-04-14 ENCOUNTER — Emergency Department (HOSPITAL_COMMUNITY)
Admission: EM | Admit: 2011-04-14 | Discharge: 2011-04-15 | Disposition: A | Discharge status: Home / Self Care | Payer: Medicaid Other | Attending: Emergency Medicine | Admitting: Emergency Medicine

## 2011-04-14 DIAGNOSIS — J45909 Unspecified asthma, uncomplicated: Secondary | ICD-10-CM | POA: Insufficient documentation

## 2011-04-14 DIAGNOSIS — G8929 Other chronic pain: Secondary | ICD-10-CM | POA: Insufficient documentation

## 2011-04-14 DIAGNOSIS — R10812 Left upper quadrant abdominal tenderness: Secondary | ICD-10-CM | POA: Insufficient documentation

## 2011-04-14 DIAGNOSIS — K861 Other chronic pancreatitis: Secondary | ICD-10-CM | POA: Insufficient documentation

## 2011-04-14 DIAGNOSIS — R1012 Left upper quadrant pain: Secondary | ICD-10-CM | POA: Insufficient documentation

## 2011-04-14 LAB — URINE MICROSCOPIC-ADD ON

## 2011-04-14 LAB — DIFFERENTIAL
Basophils Absolute: 0 10*3/uL (ref 0.0–0.1)
Eosinophils Absolute: 0.3 10*3/uL (ref 0.0–0.7)
Eosinophils Relative: 4 % (ref 0–5)
Monocytes Absolute: 0.5 10*3/uL (ref 0.1–1.0)

## 2011-04-14 LAB — COMPREHENSIVE METABOLIC PANEL
AST: 41 U/L — ABNORMAL HIGH (ref 0–37)
Albumin: 4.4 g/dL (ref 3.5–5.2)
Alkaline Phosphatase: 99 U/L (ref 39–117)
BUN: 10 mg/dL (ref 6–23)
Chloride: 103 mEq/L (ref 96–112)
Creatinine, Ser: 0.57 mg/dL (ref 0.50–1.10)
Potassium: 3.6 mEq/L (ref 3.5–5.1)
Total Bilirubin: 0.4 mg/dL (ref 0.3–1.2)
Total Protein: 8.1 g/dL (ref 6.0–8.3)

## 2011-04-14 LAB — CBC
HCT: 39.7 % (ref 36.0–46.0)
MCHC: 34.3 g/dL (ref 30.0–36.0)
Platelets: 251 10*3/uL (ref 150–400)
RDW: 12.1 % (ref 11.5–15.5)
WBC: 6.8 10*3/uL (ref 4.0–10.5)

## 2011-04-14 LAB — URINALYSIS, ROUTINE W REFLEX MICROSCOPIC
Glucose, UA: NEGATIVE mg/dL
Ketones, ur: NEGATIVE mg/dL
Leukocytes, UA: NEGATIVE
Nitrite: NEGATIVE
Specific Gravity, Urine: 1.022 (ref 1.005–1.030)
pH: 5.5 (ref 5.0–8.0)

## 2011-04-14 LAB — LIPASE, BLOOD: Lipase: 60 U/L — ABNORMAL HIGH (ref 11–59)

## 2011-04-15 LAB — LIPID PANEL
Triglycerides: 263 — ABNORMAL HIGH
VLDL: 53 — ABNORMAL HIGH

## 2011-04-15 LAB — HEPATIC FUNCTION PANEL
Bilirubin, Direct: 0.2
Indirect Bilirubin: 0.4
Total Bilirubin: 0.6

## 2011-04-15 LAB — DIFFERENTIAL
Basophils Absolute: 0.1
Eosinophils Relative: 7 — ABNORMAL HIGH
Lymphocytes Relative: 24
Lymphocytes Relative: 27
Lymphs Abs: 2.2
Lymphs Abs: 3
Monocytes Absolute: 1 — ABNORMAL HIGH
Monocytes Relative: 11
Neutro Abs: 6.7

## 2011-04-15 LAB — I-STAT 8, (EC8 V) (CONVERTED LAB)
Bicarbonate: 25.3 — ABNORMAL HIGH
HCT: 38
Hemoglobin: 12.9
Operator id: 294511
Potassium: 3.5
Sodium: 142
TCO2: 27

## 2011-04-15 LAB — COMPREHENSIVE METABOLIC PANEL
ALT: 15
AST: 21
Albumin: 3.7
CO2: 28
Calcium: 9.3
Creatinine, Ser: 0.75
GFR calc Af Amer: >60
GFR calc non Af Amer: >60
Sodium: 142
Total Protein: 6.9

## 2011-04-15 LAB — POCT I-STAT CREATININE: Operator id: 294511

## 2011-04-15 LAB — CBC
HCT: 34.8 — ABNORMAL LOW
Hemoglobin: 11.8 — ABNORMAL LOW
MCHC: 33.4
MCV: 80.3
Platelets: 322
Platelets: 362
RDW: 13
RDW: 13.2
WBC: 11.2 — ABNORMAL HIGH

## 2011-04-15 LAB — LIPASE, BLOOD: Lipase: 130 — ABNORMAL HIGH

## 2011-04-15 LAB — URINALYSIS, ROUTINE W REFLEX MICROSCOPIC
Glucose, UA: NEGATIVE
Hgb urine dipstick: NEGATIVE
Ketones, ur: 15 — AB
pH: 5.5

## 2011-04-15 LAB — POCT PREGNANCY, URINE
Operator id: 294511
Preg Test, Ur: NEGATIVE

## 2011-04-15 LAB — ETHANOL: Alcohol, Ethyl (B): <5

## 2011-04-16 LAB — URINALYSIS, ROUTINE W REFLEX MICROSCOPIC
Ketones, ur: NEGATIVE
Nitrite: NEGATIVE
Specific Gravity, Urine: 1.024
pH: 7

## 2011-04-16 LAB — COMPREHENSIVE METABOLIC PANEL
AST: 21
Albumin: 3.6
Alkaline Phosphatase: 71
Chloride: 105
GFR calc Af Amer: >60
Potassium: 4
Total Bilirubin: 0.5

## 2011-04-16 LAB — CBC
Hemoglobin: 12.2
MCHC: 34
MCV: 79.1
Platelets: 283
RBC: 4.54
WBC: 8.6

## 2011-04-16 LAB — I-STAT 8, (EC8 V) (CONVERTED LAB)
BUN: 13
Bicarbonate: 24.9 — ABNORMAL HIGH
HCT: 39
Operator id: 277751
pCO2, Ven: 45.4

## 2011-04-16 LAB — LIPASE, BLOOD: Lipase: 38

## 2011-04-16 LAB — HEPATIC FUNCTION PANEL
Bilirubin, Direct: 0.3
Indirect Bilirubin: 0.5
Total Bilirubin: 0.8

## 2011-04-16 LAB — DIFFERENTIAL
Basophils Absolute: 0
Basophils Relative: 1
Eosinophils Absolute: 0.6
Eosinophils Relative: 5
Lymphocytes Relative: 25
Monocytes Absolute: 0.7
Monocytes Absolute: 0.8 — ABNORMAL HIGH
Monocytes Relative: 8
Monocytes Relative: 9
Neutrophils Relative %: 51

## 2011-04-16 LAB — POCT I-STAT CREATININE: Operator id: 277751

## 2011-04-19 LAB — URINE MICROSCOPIC-ADD ON

## 2011-04-19 LAB — CBC
HCT: 36.7
HCT: 37.7
Hemoglobin: 12.5
Hemoglobin: 12.7
MCHC: 33.8
MCHC: 34
MCHC: 34.3
MCV: 78.4
MCV: 78.6
Platelets: 319
RBC: 4.72
RDW: 12.9
RDW: 13.1
RDW: 13.2
WBC: 8.5

## 2011-04-19 LAB — I-STAT 8, (EC8 V) (CONVERTED LAB)
Acid-base deficit: 2
BUN: 14
Bicarbonate: 23.8
Chloride: 108
Glucose, Bld: 105 — ABNORMAL HIGH
Glucose, Bld: 83
HCT: 40
HCT: 41
Hemoglobin: 13.6
Hemoglobin: 13.9
Operator id: 151321
Operator id: 277751
Potassium: 3.5
Potassium: 3.6
Sodium: 140
Sodium: 145
TCO2: 25
TCO2: 26
pCO2, Ven: 44.5 — ABNORMAL LOW
pH, Ven: 7.336 — ABNORMAL HIGH

## 2011-04-19 LAB — BASIC METABOLIC PANEL
BUN: 10
CO2: 26
GFR calc non Af Amer: >60
Glucose, Bld: 104 — ABNORMAL HIGH
Potassium: 4.1

## 2011-04-19 LAB — COMPREHENSIVE METABOLIC PANEL
AST: 20
Albumin: 3.5
BUN: 12
Calcium: 9.1
Creatinine, Ser: 0.8
GFR calc Af Amer: >60
Total Protein: 7

## 2011-04-19 LAB — URINALYSIS, ROUTINE W REFLEX MICROSCOPIC
Bilirubin Urine: NEGATIVE
Bilirubin Urine: NEGATIVE
Bilirubin Urine: NEGATIVE
Bilirubin Urine: NEGATIVE
Bilirubin Urine: NEGATIVE
Glucose, UA: NEGATIVE
Glucose, UA: NEGATIVE
Glucose, UA: NEGATIVE
Glucose, UA: NEGATIVE
Hgb urine dipstick: NEGATIVE
Hgb urine dipstick: NEGATIVE
Hgb urine dipstick: NEGATIVE
Hgb urine dipstick: NEGATIVE
Ketones, ur: NEGATIVE
Ketones, ur: NEGATIVE
Ketones, ur: NEGATIVE
Ketones, ur: NEGATIVE
Nitrite: NEGATIVE
Nitrite: NEGATIVE
Nitrite: NEGATIVE
Protein, ur: NEGATIVE
Specific Gravity, Urine: 1.026
Specific Gravity, Urine: 1.028
Specific Gravity, Urine: 1.028
Urobilinogen, UA: 0.2
Urobilinogen, UA: 1
pH: 6
pH: 6
pH: 6
pH: 6.5
pH: 6.5

## 2011-04-19 LAB — DIFFERENTIAL
Basophils Absolute: 0
Basophils Absolute: 0.1
Basophils Relative: 1
Eosinophils Absolute: 0.4
Eosinophils Relative: 3
Eosinophils Relative: 5
Eosinophils Relative: 6 — ABNORMAL HIGH
Lymphocytes Relative: 28
Lymphocytes Relative: 35
Lymphs Abs: 2.9
Monocytes Absolute: 0.5
Monocytes Absolute: 0.8 — ABNORMAL HIGH
Monocytes Absolute: 0.9 — ABNORMAL HIGH
Monocytes Relative: 8
Monocytes Relative: 9
Neutro Abs: 4.5
Neutro Abs: 5.5

## 2011-04-19 LAB — HEPATIC FUNCTION PANEL
ALT: 13
ALT: 17
AST: 17
AST: 25
Bilirubin, Direct: <0.1
Total Bilirubin: 0.3
Total Protein: 6.9

## 2011-04-19 LAB — POCT PREGNANCY, URINE: Preg Test, Ur: NEGATIVE

## 2011-04-19 LAB — POCT I-STAT CREATININE
Creatinine, Ser: 0.7
Operator id: 277751

## 2011-04-27 ENCOUNTER — Inpatient Hospital Stay (HOSPITAL_COMMUNITY)
Admission: EM | Admit: 2011-04-27 | Discharge: 2011-05-05 | DRG: 440 | Disposition: A | Discharge status: Home / Self Care | Payer: Medicaid Other | Attending: Internal Medicine | Admitting: Internal Medicine

## 2011-04-27 DIAGNOSIS — R945 Abnormal results of liver function studies: Secondary | ICD-10-CM | POA: Diagnosis present

## 2011-04-27 DIAGNOSIS — J45909 Unspecified asthma, uncomplicated: Secondary | ICD-10-CM | POA: Diagnosis present

## 2011-04-27 DIAGNOSIS — Z833 Family history of diabetes mellitus: Secondary | ICD-10-CM

## 2011-04-27 DIAGNOSIS — Z8249 Family history of ischemic heart disease and other diseases of the circulatory system: Secondary | ICD-10-CM

## 2011-04-27 DIAGNOSIS — K219 Gastro-esophageal reflux disease without esophagitis: Secondary | ICD-10-CM | POA: Diagnosis present

## 2011-04-27 DIAGNOSIS — Z87891 Personal history of nicotine dependence: Secondary | ICD-10-CM

## 2011-04-27 DIAGNOSIS — E876 Hypokalemia: Secondary | ICD-10-CM | POA: Diagnosis present

## 2011-04-27 DIAGNOSIS — K861 Other chronic pancreatitis: Secondary | ICD-10-CM | POA: Diagnosis present

## 2011-04-27 DIAGNOSIS — K859 Acute pancreatitis without necrosis or infection, unspecified: Principal | ICD-10-CM | POA: Diagnosis present

## 2011-04-27 LAB — DIFFERENTIAL
Basophils Absolute: 0.1 10*3/uL (ref 0.0–0.1)
Lymphocytes Relative: 33 % (ref 12–46)
Lymphs Abs: 3.2 10*3/uL (ref 0.7–4.0)
Monocytes Absolute: 0.9 10*3/uL (ref 0.1–1.0)
Monocytes Relative: 10 % (ref 3–12)
Neutro Abs: 5.1 10*3/uL (ref 1.7–7.7)

## 2011-04-27 LAB — COMPREHENSIVE METABOLIC PANEL
ALT: 12 U/L (ref 0–35)
AST: 15 U/L (ref 0–37)
Albumin: 4.2 g/dL (ref 3.5–5.2)
CO2: 25 mEq/L (ref 19–32)
Calcium: 9.7 mg/dL (ref 8.4–10.5)
Chloride: 105 mEq/L (ref 96–112)
Creatinine, Ser: 0.63 mg/dL (ref 0.50–1.10)
GFR calc non Af Amer: >90 mL/min (ref >90)
Sodium: 140 mEq/L (ref 135–145)
Total Bilirubin: 0.2 mg/dL — ABNORMAL LOW (ref 0.3–1.2)

## 2011-04-27 LAB — CBC
Platelets: 293 10*3/uL (ref 150–400)
RBC: 4.87 MIL/uL (ref 3.87–5.11)
RDW: 12.3 % (ref 11.5–15.5)
WBC: 9.6 10*3/uL (ref 4.0–10.5)

## 2011-04-28 ENCOUNTER — Inpatient Hospital Stay (HOSPITAL_COMMUNITY): Payer: Medicaid Other

## 2011-04-28 LAB — CBC
HCT: 38.2 % (ref 36.0–46.0)
Hemoglobin: 12.8 g/dL (ref 12.0–15.0)
MCH: 27.4 pg (ref 26.0–34.0)
MCHC: 33.5 g/dL (ref 30.0–36.0)
MCV: 81.6 fL (ref 78.0–100.0)
Platelets: 317 K/uL (ref 150–400)
RBC: 4.68 MIL/uL (ref 3.87–5.11)
RDW: 12.5 % (ref 11.5–15.5)
WBC: 7.9 K/uL (ref 4.0–10.5)

## 2011-04-28 LAB — COMPREHENSIVE METABOLIC PANEL WITH GFR
ALT: 284 U/L — ABNORMAL HIGH (ref 0–35)
AST: 503 U/L — ABNORMAL HIGH (ref 0–37)
Albumin: 3.8 g/dL (ref 3.5–5.2)
Alkaline Phosphatase: 121 U/L — ABNORMAL HIGH (ref 39–117)
BUN: 12 mg/dL (ref 6–23)
CO2: 27 meq/L (ref 19–32)
Calcium: 9.2 mg/dL (ref 8.4–10.5)
Chloride: 106 meq/L (ref 96–112)
Creatinine, Ser: 0.86 mg/dL (ref 0.50–1.10)
GFR calc Af Amer: >90 mL/min
GFR calc non Af Amer: >90 mL/min
Glucose, Bld: 97 mg/dL (ref 70–99)
Potassium: 3.6 meq/L (ref 3.5–5.1)
Sodium: 142 meq/L (ref 135–145)
Total Bilirubin: 0.9 mg/dL (ref 0.3–1.2)
Total Protein: 7.6 g/dL (ref 6.0–8.3)

## 2011-04-28 LAB — LIPASE, BLOOD: Lipase: 130 U/L — ABNORMAL HIGH (ref 11–59)

## 2011-04-29 LAB — CBC
MCH: 27.4 pg (ref 26.0–34.0)
MCV: 80.4 fL (ref 78.0–100.0)
Platelets: 283 10*3/uL (ref 150–400)
RBC: 4.38 MIL/uL (ref 3.87–5.11)
RDW: 12.1 % (ref 11.5–15.5)
WBC: 6.8 10*3/uL (ref 4.0–10.5)

## 2011-04-29 LAB — DRUGS OF ABUSE SCREEN W/O ALC, ROUTINE URINE
Cocaine Metabolites: NEGATIVE
Marijuana Metabolite: NEGATIVE
Methadone: NEGATIVE
Opiate Screen, Urine: NEGATIVE
Propoxyphene: NEGATIVE

## 2011-04-29 LAB — BASIC METABOLIC PANEL
CO2: 27 mEq/L (ref 19–32)
Calcium: 9 mg/dL (ref 8.4–10.5)
Chloride: 103 mEq/L (ref 96–112)
Creatinine, Ser: 0.66 mg/dL (ref 0.50–1.10)
GFR calc Af Amer: >90 mL/min (ref >90)
Sodium: 139 mEq/L (ref 135–145)

## 2011-04-29 LAB — LIPASE, BLOOD: Lipase: 441 U/L — ABNORMAL HIGH (ref 11–59)

## 2011-04-29 NOTE — H&P (Signed)
[name]               ACCOUNT NO.:  [id number]  MEDICAL RECORD NO.:  [id number]  LOCATION:  [facility]                         FACILITY:  [facility]  PHYSICIAN:  Osvaldo Shipper, MD     DATE OF BIRTH:  1981-05-22  DATE OF ADMISSION:  04/27/2011 DATE OF DISCHARGE:                             HISTORY & PHYSICAL   PRIMARY CARE PHYSICIAN:  [facility].  The patient apparently seen by a gastroenterologist in [place].  She has been seen by Dr. Jeani Hawking here back in September.  She was seen by Dr. Madilyn Fireman back in June, but has not established care with any of the local gastroenterologist.  ADMISSION DIAGNOSIS:  Acute pancreatitis, etiology remains unclear.  CHIEF COMPLAINT:  Abdominal pain.  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  She tells me that she was last admitted here to the hospital back in September for acute pancreatitis.  Reviewing the discharge summary from that time, she was admitted to the teaching service for reasons that are not entirely clear, although she is a [facility] patient.  They actually transferred her to [facility] on March 21, 2011, for further evaluation of her pancreatitis and for management of possible sphincter of Oddi dysfunction.  Apparently, the patient was scheduled for ERCP over there, but left against medical advice on March 23, 2011, from [place].  Upon questioning the patient, she tells me that she will never go back to [facility] for reasons that are not entirely clear.  She tells me that they did not want to do any testing, but that is not what is mentioned in this discharge summary dictated on April 02, 2011, under Dr. Charlesetta Shanks name.  In any case, the patient presented to the hospital today with complaints of nausea, vomiting, abdominal pain which has been ongoing for the past many days.  The symptoms got worse over  the last few days and so she decided to come back into the hospital.  She has had multiple emergency room visits over the period of this month.  She tells me she is also having diarrhea, which is watery stool and whenever she eats something, it comes out immediately.  She has tried Pepto-Bismol.  She has tried some over-the-counter medications without any relief.  She denies any weight loss otherwise.  She mentioned that her pain is currently 10/10 in intensity, although she is sitting comfortably on the bed.  The pain is located in the upper abdomen radiating to the back.  Denies any dysuria or hematuria.  She did mention black stools, however, she is on Pepto-Bismol which can turn the stool black.  Denies any fever or chills, however, has been feeling some degree of hot and cold.  She feels dehydrated.  MEDICATIONS AT HOME:  She tells me she is on estradiol, unknown dose. She is on Percocet as needed.  She was prescribed 10 tablets of Dilaudid by this physician at [facility], which she has ran out of.  ALLERGIES:  Multiple drug allergies including IBUPROFEN, DARVOCET, TRAMADOL, DEMEROL, MORPHINE, ASPIRIN, PROTONIX, KETOROLAC, ACETAMINOPHEN, and FENTANYL.  PAST MEDICAL HISTORY:  Positive for pancreatitis, which she tells me has been ongoing for the past 5 years.  She does have chronic pancreatitis, etiology is unclear.  She had MRCP back in June and that could not rule out pancreatic divisum.  Unfortunately, it looks like the patient is not very compliant with her followup.  She was referred to [facility] by the teaching service last time, however, she did not stay in that hospital. She has a history of anemia.  She has had hysterectomy due to fibroids.  SOCIAL HISTORY:  Lives in [place] with her children.  She works as a Engineer, civil (consulting).  She quit smoking a month ago.  Denies any alcohol or illicit drug use.  FAMILY HISTORY:  Positive for diabetes, hypertension, and sickle cell.  REVIEW OF  SYSTEMS:  Unremarkable except as mentioned in the HPI.  PHYSICAL EXAMINATION:  VITAL SIGNS:  She was afebrile here, temperature was 98.5, blood pressure 117/83, saturation 98% on room air, heart rate was 80 beats per minute, respiratory rate was 20 breaths per minute. GENERAL:  Overweight African American female, in no distress. HEENT:  Head is normocephalic, atraumatic.  Pupils are equal, reacting. No pallor.  No icterus.  Oral mucous membranes slightly dry.  No oral lesions are noted. NECK:  Soft and supple.  No thyromegaly is appreciated. LUNGS:  Clear to auscultation bilaterally with no wheezing, rales, or rhonchi. CARDIOVASCULAR:  S1 and S2 is normal, regular.  No S3, S4, rubs, murmurs, or bruit. ABDOMEN:  Soft.  Tenderness is present in the epigastric area without any rebound, rigidity, or guarding.  No masses or organomegaly is appreciated. GU:  Deferred. MUSCULOSKELETAL:  Normal muscle mass and tone. NEUROLOGIC:  She is alert and oriented x3.  No focal neurological deficits are present. SKIN:  There is a lesion in the left upper arm, which is about 1 cm in diameter.  There is some clear exudate from that area.  The area is slightly tender to palpation.  She tells me that the lesion was seen after she took her dose of Tylenol.  This remains unclear to me.  LABORATORY DATA:  Her white cell count is normal.  Hemoglobin is normal. Platelet count is normal.  Electrolytes are all normal.  LFTs are normal.  The only thing abnormal is her lipase, which is 431.  No imaging studies done today.  She has had multiple imaging studies in the past.  ASSESSMENT:  This is a 30 year old African American female, who has chronic pancreatitis, etiology of which is not clear who presents with acute abdominal pain, nausea, vomiting and is found to have acute pancreatitis.  PLAN: 1. Acute pancreatitis.  We will keep her NPO except for ice chips and     medications.  We will give her narcotic  pain medications, give her     IV fluids and then re-evaluate her labs in the morning.     Unfortunately, the patient has not kept up with her appointments     and her referrals to tertiary centers, so there is limitation to     what we can do for this patient at this time except for symptom     management. 2. DVT prophylaxis with SCDs. 3. Since she has had a hysterectomy, no need for urine pregnancy test. 4. Protonix will be given intravenously for now. 5. Further management and decisions will depend on results of further     testing and the patient's response to treatment.  Osvaldo Shipper, MD  GK/MEDQ  D:  04/27/2011  T:  04/27/2011  Job:  [id number]  cc:   [facility]  Electronically Signed by Osvaldo Shipper MD on 04/29/2011 07:39:04 PM

## 2011-04-30 LAB — COMPREHENSIVE METABOLIC PANEL
ALT: 109 U/L — ABNORMAL HIGH (ref 0–35)
AST: 54 U/L — ABNORMAL HIGH (ref 0–37)
Albumin: 3.6 g/dL (ref 3.5–5.2)
Calcium: 9.1 mg/dL (ref 8.4–10.5)
Creatinine, Ser: 0.66 mg/dL (ref 0.50–1.10)
Sodium: 140 mEq/L (ref 135–145)

## 2011-04-30 LAB — WOUND CULTURE: Gram Stain: NONE SEEN

## 2011-04-30 LAB — CBC
HCT: 35.4 % — ABNORMAL LOW (ref 36.0–46.0)
Hemoglobin: 11.8 g/dL — ABNORMAL LOW (ref 12.0–15.0)
MCH: 27.1 pg (ref 26.0–34.0)
MCV: 81.2 fL (ref 78.0–100.0)
Platelets: 262 10*3/uL (ref 150–400)
RBC: 4.36 MIL/uL (ref 3.87–5.11)
WBC: 6.8 10*3/uL (ref 4.0–10.5)

## 2011-05-01 LAB — CBC
HCT: 36 % (ref 36.0–46.0)
Hemoglobin: 12.2 g/dL (ref 12.0–15.0)
MCH: 27.4 pg (ref 26.0–34.0)
MCHC: 33.9 g/dL (ref 30.0–36.0)
MCV: 80.7 fL (ref 78.0–100.0)

## 2011-05-01 LAB — COMPREHENSIVE METABOLIC PANEL
Alkaline Phosphatase: 102 U/L (ref 39–117)
BUN: 3 mg/dL — ABNORMAL LOW (ref 6–23)
Creatinine, Ser: 0.69 mg/dL (ref 0.50–1.10)
GFR calc Af Amer: >90 mL/min (ref >90)
Glucose, Bld: 89 mg/dL (ref 70–99)
Potassium: 3.7 mEq/L (ref 3.5–5.1)
Total Protein: 7.4 g/dL (ref 6.0–8.3)

## 2011-05-01 LAB — LIPASE, BLOOD: Lipase: 31 U/L (ref 11–59)

## 2011-05-02 LAB — COMPREHENSIVE METABOLIC PANEL
ALT: 84 U/L — ABNORMAL HIGH (ref 0–35)
Albumin: 2.7 g/dL — ABNORMAL LOW (ref 3.5–5.2)
Alkaline Phosphatase: 86 U/L (ref 39–117)
Glucose, Bld: 105 mg/dL — ABNORMAL HIGH (ref 70–99)
Potassium: 2.6 mEq/L — CL (ref 3.5–5.1)
Sodium: 141 mEq/L (ref 135–145)
Total Protein: 5.5 g/dL — ABNORMAL LOW (ref 6.0–8.3)

## 2011-05-02 LAB — CBC
Hemoglobin: 10.6 g/dL — ABNORMAL LOW (ref 12.0–15.0)
MCHC: 33.9 g/dL (ref 30.0–36.0)
RDW: 12.1 % (ref 11.5–15.5)
WBC: 5 10*3/uL (ref 4.0–10.5)

## 2011-05-03 LAB — LIPASE, BLOOD: Lipase: 28 U/L (ref 11–59)

## 2011-05-04 LAB — BASIC METABOLIC PANEL
CO2: 24 mEq/L (ref 19–32)
Chloride: 104 mEq/L (ref 96–112)
Sodium: 138 mEq/L (ref 135–145)

## 2011-05-04 LAB — LIPASE, BLOOD: Lipase: 32 U/L (ref 11–59)

## 2011-05-05 LAB — LIPASE, BLOOD: Lipase: 30 U/L (ref 11–59)

## 2011-05-05 LAB — BASIC METABOLIC PANEL
CO2: 24 mEq/L (ref 19–32)
Calcium: 9.2 mg/dL (ref 8.4–10.5)
Chloride: 105 mEq/L (ref 96–112)
Potassium: 4 mEq/L (ref 3.5–5.1)
Sodium: 140 mEq/L (ref 135–145)

## 2011-05-05 NOTE — Progress Notes (Signed)
Mikayla, Lee               ACCOUNT NO.:  [id number]  MEDICAL RECORD NO.:  [id number]  LOCATION:  3018                         FACILITY:  [facility]  PHYSICIAN:  Thad Ranger, MD       DATE OF BIRTH:  Dec 21, 1980                                PROGRESS NOTE   PRIMARY CARE PHYSICIAN: [facility].  PRIMARY GASTROENTEROLOGIST: Dr. Niel Hummer in [place].  CURRENT DIAGNOSES: 1. Acute and recurrent pancreatitis. 2. Hypokalemia, resolved. 3. Abnormal liver enzymes likely due to sphincter of Oddi dysfunction. 4. History of asthma, stable. 5. Gastroesophageal reflux disease.  BRIEF HISTORY OF PRESENT ILLNESS: At the time of admission, [name] is a 30 year old African American female with history of recurrent pancreatitis of unclear etiology who is admitted with acute and recurrent pancreatitis.  The patient had presented with complaints of nausea, vomiting, abdominal pain, which had been ongoing for the past many days prior to admission.  Per the records and H and P, the patient had multiple emergency room visits over the period of this month.  For details, please refer to the admission note dictated by Dr. Osvaldo Shipper on April 27, 2011.  RADIOLOGICAL DATA: Acute abdominal series on October 24, bowel gas pattern favors constipation.  Ultrasound abdomen on April 28, 2011, cholecystectomy, normal appearance of the common bile duct at 6 mm, right interpolar renal cyst.  BRIEF HOSPITALIZATION COURSE: 1. Acute and recurrent pancreatitis of unclear etiology.  The patient     has a chronic and recurrent pancreatitis.  She had an MRCP back in     June which could not rule out pancreatic divisum.  Also, the     patient is not compliant with her followups.  She was referred to     [facility] by the Teaching Service last time, however, she did     not stay in the hospital.  The patient was scheduled for ERCP over     there at [facility] in September, however, the  patient     left against medical advice at that time.  The patient has been     asymptomatically managed with initially n.p.o. and IV fluids and     pain control.  The diet has been advanced to full liquids at     present.  I discussed in detail with our gastroenterologist Dr.     Evette Cristal in detail who reviewed the records and recommended that the     patient would be better managed at [facility] given her     unclear etiology of the pancreatitis which could be from either     pancreatic divisum or sphincter of Oddi dysfunction.  She     needs endoscopic ultrasound and consultation by a biliary specialist in     [facility].  I also spoke with PA of patient's     gastroenterologist Dr. Niel Hummer in [place].  Dr. Niel Hummer was     not available.  The physician assistant, Ms. Monica apparently is     well aware of the patient and stated that Dr. Niel Hummer had     recommended her to be admitted at [facility] where he could follow  the patient, however, the patient chose to be admitted at [facility] due to transportation issues and her family being in     [place].  I discussed multiple times with the patient that her     pancreatitis can only be medically managed here and per all the     recommendations from her gastroenterologist and Dr. Evette Cristal, she would     be better served in a [facility].  The patient, however,     continuously refuses to be transferred to [facility].     She wants to try the liquid diet today and advanced to solids if     possible tomorrow. 2. Hypokalemia, this was aggressively replaced. 3. History of asthma, stable.  DISPOSITION: Not medically ready, however, the patient does have a follow up appointment with her own gastroenterologist on May 11, 2011, when she is ready to be discharged.  PHYSICAL EXAMINATION: VITAL SIGNS AT THE TIME OF MY DICTATION:  Temp 98.4, pulse 65, respirations 20, blood pressure  118/73, O2 sats 93% on room air. GENERAL:  The patient is alert, awake, and oriented x3. HEENT:  Anicteric sclerae.  Clear conjunctivae.  Pupils equal and reactive to light and accommodation.  EOMI. NECK:  Supple.  No lymphadenopathy.  No JVD. CVS:  S1, S2 clear. CHEST:  Clear to auscultation bilaterally. ABDOMEN:  Epigastric tenderness on deep palpation.  Soft, normal bowel sounds. EXTREMITIES:  No cyanosis, clubbing, or edema noted in upper or lower extremities bilaterally.     Thad Ranger, MD     RR/MEDQ  D:  05/03/2011  T:  05/03/2011  Job:  [id number]  cc:   [facility]  Dr. Arminda Resides  Electronically Signed by Andres Labrum Tabor Bartram  on 05/05/2011 05:03:12 PM

## 2011-05-07 NOTE — Discharge Summary (Signed)
[name], [name]               ACCOUNT NO.:  [id number]  MEDICAL RECORD NO.:  [id number]  LOCATION:  3018                         FACILITY:  [facility]  PHYSICIAN:  Marcellus Scott, MD     DATE OF BIRTH:  07/16/1980  DATE OF ADMISSION:  04/27/2011 DATE OF DISCHARGE:  05/05/2011                        DISCHARGE SUMMARY - REFERRING   PRIMARY CARE PHYSICIAN:  At [employer], the patient cannot recollect the name.  DISCHARGE DIAGNOSES: 1. Recurrent acute pancreatitis, etiology unclear. 2. Hypokalemia, repleted. 3. Abnormal liver function tests, possibly secondary to sphincter of     Oddi dysfunction. 4. History of asthma, stable. 5. History of gastroesophageal reflux disease.  DISCHARGE MEDICATIONS: 1. Neosporin ointment 1 application topically to the left forearm     lesion for 3 more days and then stop. 2. Benadryl 50 mg p.o. q.4 h. p.r.n. allergies. 3. Estradiol 2 mg p.o. at bedtime.  IMAGING: 1. Complete ultrasound of the abdomen.  Impression,     a.     Cholecystectomy.  Normal appearance of the common bile duct      at 6 mm.     b.     Right interpolar renal cyst. 2. Abdominal x-ray on April 28, 2011.  Bowel gas pattern favors     constipation. 3. Chest x-ray shows clear lung fields and no pleural fluid.  LAB DATA:  Basic metabolic panel today within normal limits.  BUN 3, creatinine 0.64, and potassium 4, lipase 30, magnesium 1.9.  Hepatic panel significant for AST 87, ALT 84, total protein 5.5, albumin 2.7. Wound culture from April 27, 2011 from the left forearm showed no growth final report.  Serum drug screen was negative.  Admitting lipase was 431, AST and ALT was 15 and 12.  CONSULTATIONS:  Phone consultation with patient's primary gastroenterologist Dr. Niel Hummer and the [facility] gastroenterologist.  DIET:  Low-fat diet.  ACTIVITY:  Ad lib.  Today's complaints.  The patient indicates that she feels fine. Although she is smiling and laughing, she  indicates that she has abdominal pain and is requesting IV pain medications.  She says she tolerated cheese sandwich.  PHYSICAL EXAMINATION:  GENERAL:  [name] is in no obvious distress. VITAL SIGNS:  Temperature 98.4 degrees Fahrenheit, pulse 96 per minute, regular, respirations 16 per minute, blood pressure 114/77 mmHg, and saturating at 98% on room air. RESPIRATORY:  Clear. CARDIOVASCULAR:  First and second heart sounds heard and regular. ABDOMEN:  Nondistended, soft, and normal bowel sounds heard. CENTRAL NERVOUS SYSTEM:  The patient is awake, alert, oriented x3 with no focal neurological deficits. EXTREMITIES:  Small area of infection which is about 1 x 2 cm on the left upper ventral forearm has almost resolved.  HOSPITAL COURSE:  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 She was transferred to [facility] at the recent admission where ERCP was scheduled, but the patient left against medical advice.  She has indicated that she does not want to see her primary gastroenterologist Dr. Niel Hummer or go to [facility]  The patient now  presented with nausea, vomiting, and abdominal pain.  She has had multiple ED visits.  PROBLEM: 1. Recurrent pancreatitis of unclear etiology complicating question     chronic pancreatitis.  Obviously the patient has had multiple ED     visits, hospital visits, and visits with different physicians and     hospital systems.  She was managed symptomatically by initially     resting her bowels, IV fluids, and pain control.  Her case was     discussed at length by Dr. Isidoro Donning with Dr. Evette Cristal who reviewed records     and recommended that the patient would best be managed at a     tertiary care center given her unclear etiology of pancreatitis,     which could be either from pancreatic divisum or sphincter of  Oddi     dysfunction.  She would need an endoscopic ultrasound followed by a     biliary specialist in a tertiary care center.  Dr. Isidoro Donning also     discussed with her gastroenterologist Dr. Marlan Palau physician     assistant Ms. Monica in [place].  The patient apparently had     been recommended admission to [facility] where he could follow the     patient.  However, the patient chose to be admitted to [facility] due to transportation issues and family being in     [place].  Dr. Isidoro Donning recommended that it is best for [name] to transfer     her to a tertiary care center for extensive evaluation and     management.  The patient however declined that offer.  In the     interim, her diet has been advanced which she has tolerated.  The     patient continues to request pain medications, although she does     not overtly appear in pain.  There is some question of pain     medication seeking behavior.  At this time, the patient is stable     for discharge home.  She can follow up with her primary care     physician at the [facility] and consider referral to a     gastroenterologist of her choice with whom she can compliantly     followup with.  Also recommend follow up of her abnormal LFTs,     which may be secondary to sphincter of Oddi dysfunction. 2. Hypokalemia, repleted. 3. Abnormal liver function.  Management as indicated above. 4. History of asthma.  Stable. 5. Gastroesophageal reflux disease.  Stable.  DISPOSITION:  The patient is discharged home in stable condition.  FOLLOWUP RECOMMENDATIONS:  With MD at [facility].  The patient is to call for an appointment to be seen in 7-10 days with blood test, that is LFTs.  Time taken in coordinating this discharge is 30 minutes.     Marcellus Scott, MD     AH/MEDQ  D:  05/05/2011  T:  05/05/2011  Job:  [id number]  cc:   [employer]  Electronically Signed by Marcellus Scott MD on 05/07/2011 04:41:11 PM

## 2011-05-11 ENCOUNTER — Telehealth: Payer: Self-pay | Admitting: *Deleted

## 2011-05-11 NOTE — Telephone Encounter (Signed)
Pt left message stating that she would like a Rx  Refill. I called pt back and she stated that she needs the estradiol 2 mg. She was recently in the hospital and was only given 7 days worth of med. I told pt that I will have to review her chart, speak with the doctor and then call her back- probably tomorrow. Pt voiced understanding.

## 2011-05-12 NOTE — Telephone Encounter (Signed)
After reviewing pt chart, I observed that her last visit in our clinic was 01/01/09. She was given Rx for estradiol 2mg  and prn refills for 1 yr.  She did have an appt scheduled for 10/15/10 Annual Gyn exam and she [facility].  I called and left a message on pt's personal voice mail that we cannot refill any meds for her until she has an office appt. She was last seen almost 2.5 yrs ago. She may call the office to schedule an appt for annual exam which is overdue and provider will give a new prescription @ that time.

## 2011-05-21 ENCOUNTER — Emergency Department (HOSPITAL_COMMUNITY)
Admission: EM | Admit: 2011-05-21 | Discharge: 2011-05-22 | Disposition: A | Discharge status: Home / Self Care | Payer: Medicaid Other | Attending: Emergency Medicine | Admitting: Emergency Medicine

## 2011-05-21 ENCOUNTER — Encounter (HOSPITAL_COMMUNITY): Payer: Self-pay | Admitting: Emergency Medicine

## 2011-05-21 DIAGNOSIS — R112 Nausea with vomiting, unspecified: Secondary | ICD-10-CM | POA: Insufficient documentation

## 2011-05-21 DIAGNOSIS — R1013 Epigastric pain: Secondary | ICD-10-CM | POA: Insufficient documentation

## 2011-05-21 LAB — COMPREHENSIVE METABOLIC PANEL
AST: 14 U/L (ref 0–37)
Albumin: 4.1 g/dL (ref 3.5–5.2)
BUN: 11 mg/dL (ref 6–23)
Calcium: 9.6 mg/dL (ref 8.4–10.5)
Chloride: 105 mEq/L (ref 96–112)
Creatinine, Ser: 0.64 mg/dL (ref 0.50–1.10)
Total Bilirubin: 0.3 mg/dL (ref 0.3–1.2)

## 2011-05-21 LAB — DIFFERENTIAL
Basophils Absolute: 0 10*3/uL (ref 0.0–0.1)
Basophils Relative: 0 % (ref 0–1)
Eosinophils Absolute: 0.3 10*3/uL (ref 0.0–0.7)
Eosinophils Relative: 3 % (ref 0–5)
Monocytes Absolute: 0.6 10*3/uL (ref 0.1–1.0)
Monocytes Relative: 7 % (ref 3–12)

## 2011-05-21 LAB — CBC
HCT: 37.5 % (ref 36.0–46.0)
Hemoglobin: 12.6 g/dL (ref 12.0–15.0)
MCH: 27.6 pg (ref 26.0–34.0)
MCHC: 33.6 g/dL (ref 30.0–36.0)
MCV: 82.1 fL (ref 78.0–100.0)
RDW: 12.4 % (ref 11.5–15.5)

## 2011-05-21 LAB — LIPASE, BLOOD: Lipase: 57 U/L (ref 11–59)

## 2011-05-21 MED ORDER — DIPHENHYDRAMINE HCL 50 MG/ML IJ SOLN
25.0000 mg | Freq: Once | INTRAMUSCULAR | Status: AC
  Administered 2011-05-21: 25 mg via INTRAVENOUS
  Filled 2011-05-21: qty 1

## 2011-05-21 MED ORDER — ONDANSETRON HCL 4 MG/2ML IJ SOLN
4.0000 mg | Freq: Once | INTRAMUSCULAR | Status: AC
  Administered 2011-05-21: 4 mg via INTRAVENOUS
  Filled 2011-05-21: qty 2

## 2011-05-21 MED ORDER — SODIUM CHLORIDE 0.9 % IV SOLN
INTRAVENOUS | Status: DC
  Administered 2011-05-21 (×2): via INTRAVENOUS

## 2011-05-21 MED ORDER — HYDROMORPHONE HCL PF 1 MG/ML IJ SOLN
1.0000 mg | Freq: Once | INTRAMUSCULAR | Status: AC
  Administered 2011-05-21: 1 mg via INTRAVENOUS
  Filled 2011-05-21: qty 1

## 2011-05-21 MED ORDER — OXYCODONE-ACETAMINOPHEN 5-325 MG PO TABS: 2.0000 | ORAL_TABLET | ORAL | Status: DC | PRN

## 2011-05-21 MED ORDER — SODIUM CHLORIDE 0.9 % IV BOLUS (SEPSIS): 1000.0000 mL | Freq: Once | INTRAVENOUS | Status: DC

## 2011-05-21 MED ORDER — SODIUM CHLORIDE 0.9 % IV BOLUS (SEPSIS)
1000.0000 mL | Freq: Once | INTRAVENOUS | Status: AC
  Administered 2011-05-21: 1000 mL via INTRAVENOUS

## 2011-05-21 MED ORDER — DOXYCYCLINE HYCLATE 100 MG PO CAPS: 100.0000 mg | ORAL_CAPSULE | Freq: Two times a day (BID) | ORAL | Status: DC

## 2011-05-21 MED ORDER — ONDANSETRON HCL 4 MG PO TABS: 4.0000 mg | ORAL_TABLET | Freq: Four times a day (QID) | ORAL | Status: AC

## 2011-05-21 NOTE — ED Notes (Signed)
Moved patient to PDA 11 (from stretcher 7).  Report given to Inetta Fermo, Charity fundraiser.

## 2011-05-21 NOTE — ED Notes (Signed)
Pt not vomitting at present--"just hurts and I'm tired of it-have an appt with my doctor after Thanksgiving"

## 2011-05-21 NOTE — ED Notes (Signed)
IV Team paged--attempts to gain access unsuccessful by nurses.

## 2011-05-21 NOTE — ED Notes (Signed)
Patient complaining of upper quadrant abdominal pain due to a cyst on her ovary (was diagnosed with an ultrasound), with radiation to her lower back.  Patient states that she is also in pain due to her pancreatitis (has had for six years).  Describes the pain as "sharp" and "aching"; patient also reporting vomiting and diarrhea.  Currently rates pain 10/10 on the numerical pain scale.  Last vomited before she came to the ED.  Last bowel movement today; diarrhea.  Patient states that she feels dehydrated; reports little urine output.  States that she was in the hospital for the same symptoms last month; was told that her enzyme levels were high.  Upon assessment, bowel sounds are present in all four quadrants.  Patient alert and oriented x4; PERRL present.  Will continue to monitor.

## 2011-05-21 NOTE — ED Provider Notes (Signed)
History     CSN: [id number] Arrival date & time: 05/21/2011  6:16 PM   First MD Initiated Contact with Patient 05/21/11 2030      Chief Complaint  Patient presents with  . Emesis    (Consider location/radiation/quality/duration/timing/severity/associated sxs/prior treatment) HPI.  Patient complains of abdominal pain, nausea and vomiting. Past history of pancreatitis and right ovarian cyst. Has had hysterectomy. Feels slightly dehydrated. Pain is described as mild, does not radiate and is located in the epigastrium. Nothing makes pain better or worse.  Past Medical History  Diagnosis Date  . Pancreatitis   . S/P laparoscopic cholecystectomy   . Fibroids     History reviewed. No pertinent past surgical history.  No family history on file.  History  Substance Use Topics  . Smoking status: Former Games developer  . Smokeless tobacco: Not on file  . Alcohol Use: No    OB History    Grav Para Term Preterm Abortions TAB SAB Ect Mult Living                  Review of Systems  All other systems reviewed and are negative.    Allergies  Celecoxib; Codeine; Ketorolac tromethamine; Meperidine hcl; Morphine; Propoxyphene n-acetaminophen; and Tramadol hcl  Home Medications   Current Outpatient Rx  Name Route Sig Dispense Refill  . ESTRADIOL 2 MG PO TABS Oral Take 2 mg by mouth at bedtime.        BP 132/92  Pulse 87  Temp(Src) 98.2 F (36.8 C) (Oral)  Resp 16  SpO2 100%  Physical Exam  Nursing note and vitals reviewed. Constitutional: She is oriented to person, place, and time. She appears well-developed and well-nourished.  HENT:  Head: Normocephalic and atraumatic.  Eyes: Conjunctivae and EOM are normal. Pupils are equal, round, and reactive to light.  Neck: Normal range of motion. Neck supple.  Cardiovascular: Normal rate and regular rhythm.   Pulmonary/Chest: Effort normal and breath sounds normal.  Abdominal: Soft. Bowel sounds are normal.       Slight epigastric  tenderness  Musculoskeletal: Normal range of motion.  Neurological: She is alert and oriented to person, place, and time.  Skin: Skin is warm and dry.  Psychiatric: She has a normal mood and affect.    ED Course  Procedures (including critical care time)   Labs Reviewed  CBC  DIFFERENTIAL  COMPREHENSIVE METABOLIC PANEL  LIPASE, BLOOD  URINALYSIS, ROUTINE W REFLEX MICROSCOPIC   No results found.   No diagnosis found.    MDM  Patient is nontoxic. Hydrate, treat pain and nausea, check labs. Do not think the patient will need to be admitted        Donnetta Hutching, MD 05/21/11 2316

## 2011-05-21 NOTE — ED Notes (Signed)
C/o nausea, vomiting, diarrhea, and lower abd pain x 5 weeks.  Also c/o bumps on arms.

## 2011-05-22 LAB — URINALYSIS, ROUTINE W REFLEX MICROSCOPIC
Glucose, UA: NEGATIVE mg/dL
Ketones, ur: NEGATIVE mg/dL
Leukocytes, UA: NEGATIVE
Nitrite: NEGATIVE
Protein, ur: NEGATIVE mg/dL
pH: 6.5 (ref 5.0–8.0)

## 2011-05-22 MED ORDER — DIPHENHYDRAMINE HCL 25 MG PO CAPS: 25.0000 mg | ORAL_CAPSULE | Freq: Once | ORAL | Status: DC

## 2011-05-22 MED ORDER — DIPHENHYDRAMINE HCL 50 MG/ML IJ SOLN
INTRAMUSCULAR | Status: AC
  Administered 2011-05-22: 25 mg via INTRAVENOUS
  Filled 2011-05-22: qty 1

## 2011-05-27 ENCOUNTER — Emergency Department (HOSPITAL_COMMUNITY)
Admission: EM | Admit: 2011-05-27 | Discharge: 2011-05-28 | Disposition: A | Discharge status: Home / Self Care | Payer: Medicaid Other | Attending: Emergency Medicine | Admitting: Emergency Medicine

## 2011-05-27 ENCOUNTER — Encounter (HOSPITAL_COMMUNITY): Payer: Self-pay | Admitting: Emergency Medicine

## 2011-05-27 DIAGNOSIS — R112 Nausea with vomiting, unspecified: Secondary | ICD-10-CM | POA: Insufficient documentation

## 2011-05-27 DIAGNOSIS — Z79899 Other long term (current) drug therapy: Secondary | ICD-10-CM | POA: Insufficient documentation

## 2011-05-27 DIAGNOSIS — R197 Diarrhea, unspecified: Secondary | ICD-10-CM | POA: Insufficient documentation

## 2011-05-27 DIAGNOSIS — R109 Unspecified abdominal pain: Secondary | ICD-10-CM

## 2011-05-27 DIAGNOSIS — R10819 Abdominal tenderness, unspecified site: Secondary | ICD-10-CM | POA: Insufficient documentation

## 2011-05-27 DIAGNOSIS — K861 Other chronic pancreatitis: Secondary | ICD-10-CM | POA: Insufficient documentation

## 2011-05-27 LAB — DIFFERENTIAL
Basophils Absolute: 0 10*3/uL (ref 0.0–0.1)
Basophils Relative: 1 % (ref 0–1)
Eosinophils Absolute: 0.4 10*3/uL (ref 0.0–0.7)
Neutro Abs: 3.8 10*3/uL (ref 1.7–7.7)
Neutrophils Relative %: 50 % (ref 43–77)

## 2011-05-27 LAB — COMPREHENSIVE METABOLIC PANEL
ALT: 31 U/L (ref 0–35)
AST: 21 U/L (ref 0–37)
Albumin: 3.8 g/dL (ref 3.5–5.2)
Alkaline Phosphatase: 69 U/L (ref 39–117)
Potassium: 3.9 mEq/L (ref 3.5–5.1)
Sodium: 141 mEq/L (ref 135–145)
Total Protein: 7.4 g/dL (ref 6.0–8.3)

## 2011-05-27 LAB — CBC
MCH: 27.5 pg (ref 26.0–34.0)
MCHC: 33.6 g/dL (ref 30.0–36.0)
Platelets: 261 10*3/uL (ref 150–400)

## 2011-05-27 LAB — URINALYSIS, ROUTINE W REFLEX MICROSCOPIC
Bilirubin Urine: NEGATIVE
Ketones, ur: NEGATIVE mg/dL
Leukocytes, UA: NEGATIVE
Nitrite: NEGATIVE
Specific Gravity, Urine: 1.022 (ref 1.005–1.030)
Urobilinogen, UA: 1 mg/dL (ref 0.0–1.0)

## 2011-05-27 LAB — LIPASE, BLOOD: Lipase: 55 U/L (ref 11–59)

## 2011-05-27 MED ORDER — DIPHENHYDRAMINE HCL 50 MG/ML IJ SOLN
25.0000 mg | Freq: Once | INTRAMUSCULAR | Status: AC
  Administered 2011-05-27: 25 mg via INTRAVENOUS
  Filled 2011-05-27: qty 1

## 2011-05-27 MED ORDER — ONDANSETRON HCL 4 MG/2ML IJ SOLN
4.0000 mg | Freq: Once | INTRAMUSCULAR | Status: AC
  Administered 2011-05-27: 4 mg via INTRAVENOUS
  Filled 2011-05-27: qty 2

## 2011-05-27 MED ORDER — HYDROMORPHONE HCL PF 1 MG/ML IJ SOLN
1.0000 mg | Freq: Once | INTRAMUSCULAR | Status: AC
  Administered 2011-05-27: 1 mg via INTRAVENOUS
  Filled 2011-05-27: qty 1

## 2011-05-27 MED ORDER — DIPHENHYDRAMINE HCL 50 MG/ML IJ SOLN
INTRAMUSCULAR | Status: AC
  Filled 2011-05-27: qty 1

## 2011-05-27 MED ORDER — DIPHENHYDRAMINE HCL 25 MG PO CAPS
25.0000 mg | ORAL_CAPSULE | Freq: Once | ORAL | Status: DC
  Filled 2011-05-27: qty 1

## 2011-05-27 MED ORDER — DIPHENHYDRAMINE HCL 50 MG/ML IJ SOLN
25.0000 mg | Freq: Once | INTRAMUSCULAR | Status: AC
  Administered 2011-05-27: 25 mg via INTRAVENOUS

## 2011-05-27 NOTE — ED Notes (Signed)
PT. REPORTS CHRONIC RIGHT ABDOMINAL PAIN X 4 WEEKS ,VOMITING AND DIARRHEA.

## 2011-05-27 NOTE — ED Provider Notes (Signed)
History     CSN: [id number] Arrival date & time: 05/27/2011  8:37 PM   First MD Initiated Contact with Patient 05/27/11 2153      Chief Complaint  Patient presents with  . Abdominal Pain    (Consider location/radiation/quality/duration/timing/severity/associated sxs/prior treatment) Patient is a 30 y.o. female presenting with abdominal pain. The history is provided by the patient.  Abdominal Pain The primary symptoms of the illness include abdominal pain, nausea, vomiting (states is unchanged from baseline) and diarrhea (states is unchanged from baseline). The primary symptoms of the illness do not include fever, shortness of breath or dysuria. The current episode started more than 2 days ago (about 4 days ago). The onset of the illness was gradual. The problem has not changed (waxing and waning) since onset. The abdominal pain is located in the epigastric region and RLQ. The abdominal pain does not radiate. The severity of the abdominal pain is 10/10. The abdominal pain is relieved by nothing.  Associated with: chronic pancreatitis.  The patient states that she believes she is currently not pregnant. The patient has not had a change in bowel habit. Symptoms associated with the illness do not include chills, hematuria, frequency or back pain. Associated medical issues comments: chronic pancreatitis.    Past Medical History  Diagnosis Date  . Pancreatitis   . S/P laparoscopic cholecystectomy   . Fibroids     History reviewed. No pertinent past surgical history.  No family history on file.  History  Substance Use Topics  . Smoking status: Former Games developer  . Smokeless tobacco: Not on file  . Alcohol Use: No    OB History    Grav Para Term Preterm Abortions TAB SAB Ect Mult Living                  Review of Systems  Constitutional: Negative for fever and chills.  Respiratory: Negative for cough and shortness of breath.   Gastrointestinal: Positive for nausea, vomiting  (states is unchanged from baseline), abdominal pain and diarrhea (states is unchanged from baseline).  Genitourinary: Negative for dysuria, frequency and hematuria.  Musculoskeletal: Negative for myalgias, back pain and arthralgias.  Skin: Negative for color change and rash.  Neurological: Negative for light-headedness and headaches.  Psychiatric/Behavioral: Negative for confusion.  All other systems reviewed and are negative.    Allergies  Celecoxib; Codeine; Ketorolac tromethamine; Meperidine hcl; Morphine; Propoxyphene n-acetaminophen; and Tramadol hcl  Home Medications   Current Outpatient Rx  Name Route Sig Dispense Refill  . DOXYCYCLINE HYCLATE 100 MG PO CAPS Oral Take 1 capsule (100 mg total) by mouth 2 (two) times daily. 14 capsule 0  . ESTRADIOL 2 MG PO TABS Oral Take 2 mg by mouth at bedtime.      Marland Kitchen ONDANSETRON HCL 4 MG PO TABS Oral Take 1 tablet (4 mg total) by mouth every 6 (six) hours. 12 tablet 0  . OXYCODONE-ACETAMINOPHEN 5-325 MG PO TABS Oral Take 2 tablets by mouth every 4 (four) hours as needed for pain. 20 tablet 0    BP 138/96  Pulse 93  Temp(Src) 98.2 F (36.8 C) (Oral)  Resp 20  SpO2 100%  Physical Exam  Nursing note and vitals reviewed. Constitutional: She is oriented to person, place, and time. She appears well-developed and well-nourished. She appears distressed.  HENT:  Head: Normocephalic and atraumatic.  Eyes: Pupils are equal, round, and reactive to light.  Cardiovascular: Normal rate, regular rhythm and normal heart sounds.   Pulmonary/Chest: Effort normal and  breath sounds normal. No respiratory distress.  Abdominal: Soft. Bowel sounds are normal. She exhibits no distension. There is tenderness (mild/moderate epigastric, mild RLQ  ). There is no rigidity, no rebound, no guarding and no tenderness at McBurney's point.       No psoas, obturator sign  Neurological: She is alert and oriented to person, place, and time.  Skin: Skin is warm and dry.  She is not diaphoretic.  Psychiatric: She has a normal mood and affect.    ED Course  Procedures (including critical care time)  Labs Reviewed  URINALYSIS, ROUTINE W REFLEX MICROSCOPIC - Abnormal; Notable for the following:    Appearance HAZY (*)    All other components within normal limits  COMPREHENSIVE METABOLIC PANEL - Abnormal; Notable for the following:    Total Bilirubin 0.2 (*)    All other components within normal limits  CBC  DIFFERENTIAL  LIPASE, BLOOD   No results found.   1. ABDOMINAL PAIN   2. PANCREATITIS, CHRONIC       MDM  30 yo F with chronic abdominal pain due to chronic pancreatitis presents today with pain x4 days; states is both chronic epigastric pain, as well as some RLQ pain. Notes N/V/D, which she states has been chronic for her, and is unchanged from her baseline. Denies fevers, urinary symptoms. Exam remarkable for mild/moderate epigastric and mild RLQ ttp; no rebound/guarding, peritoneal signs, only mild RLQ ttp, without other signs to suggest appendicitis. Will check labs, treat symptoms.  Labs unremarkable; pt with some improvement. Is likely due to chronic problems, but advised to f/u with doctor, and discussed indications and changes in symptoms for which to return, and pt expresses understanding.        Theotis Burrow, MD 05/28/11 [phone]

## 2011-05-28 NOTE — ED Provider Notes (Signed)
I saw and evaluated the patient, reviewed the resident's note and I agree with the findings and plan. Acute on chronic abdominal pain. Labs reassuring. Mild tenderness. Discharged.   Juliet Rude. Rubin Payor, MD 05/28/11 [phone]

## 2011-06-01 ENCOUNTER — Emergency Department (HOSPITAL_COMMUNITY)
Admission: EM | Admit: 2011-06-01 | Discharge: 2011-06-01 | Disposition: A | Discharge status: Home / Self Care | Payer: Medicaid Other | Attending: Emergency Medicine | Admitting: Emergency Medicine

## 2011-06-01 ENCOUNTER — Encounter (HOSPITAL_COMMUNITY): Payer: Self-pay

## 2011-06-01 DIAGNOSIS — K861 Other chronic pancreatitis: Secondary | ICD-10-CM | POA: Insufficient documentation

## 2011-06-01 DIAGNOSIS — R197 Diarrhea, unspecified: Secondary | ICD-10-CM | POA: Insufficient documentation

## 2011-06-01 DIAGNOSIS — R1013 Epigastric pain: Secondary | ICD-10-CM | POA: Insufficient documentation

## 2011-06-01 DIAGNOSIS — R112 Nausea with vomiting, unspecified: Secondary | ICD-10-CM | POA: Insufficient documentation

## 2011-06-01 DIAGNOSIS — R1110 Vomiting, unspecified: Secondary | ICD-10-CM

## 2011-06-01 LAB — URINE MICROSCOPIC-ADD ON

## 2011-06-01 LAB — COMPREHENSIVE METABOLIC PANEL
ALT: 19 U/L (ref 0–35)
Alkaline Phosphatase: 70 U/L (ref 39–117)
CO2: 23 mEq/L (ref 19–32)
Calcium: 9.6 mg/dL (ref 8.4–10.5)
GFR calc Af Amer: >90 mL/min (ref >90)
GFR calc non Af Amer: >90 mL/min (ref >90)
Glucose, Bld: 86 mg/dL (ref 70–99)
Potassium: 4.1 mEq/L (ref 3.5–5.1)
Sodium: 140 mEq/L (ref 135–145)

## 2011-06-01 LAB — URINALYSIS, ROUTINE W REFLEX MICROSCOPIC
Bilirubin Urine: NEGATIVE
Protein, ur: NEGATIVE mg/dL
Urobilinogen, UA: 1 mg/dL (ref 0.0–1.0)

## 2011-06-01 MED ORDER — OXYCODONE-ACETAMINOPHEN 5-325 MG PO TABS: 1.0000 | ORAL_TABLET | Freq: Four times a day (QID) | ORAL | Status: AC | PRN

## 2011-06-01 MED ORDER — ONDANSETRON HCL 4 MG/2ML IJ SOLN: 4.0000 mg | Freq: Once | INTRAMUSCULAR | Status: DC

## 2011-06-01 MED ORDER — PROMETHAZINE HCL 25 MG PO TABS: 25.0000 mg | ORAL_TABLET | Freq: Four times a day (QID) | ORAL | Status: DC | PRN

## 2011-06-01 MED ORDER — SODIUM CHLORIDE 0.9 % IV BOLUS (SEPSIS): 1000.0000 mL | Freq: Once | INTRAVENOUS | Status: DC

## 2011-06-01 MED ORDER — HYDROMORPHONE HCL PF 1 MG/ML IJ SOLN
1.0000 mg | Freq: Once | INTRAMUSCULAR | Status: AC
  Administered 2011-06-01: 1 mg via INTRAMUSCULAR
  Filled 2011-06-01: qty 1

## 2011-06-01 MED ORDER — ONDANSETRON HCL 4 MG/2ML IJ SOLN
4.0000 mg | Freq: Once | INTRAMUSCULAR | Status: AC
  Administered 2011-06-01: 4 mg via INTRAMUSCULAR
  Filled 2011-06-01: qty 8

## 2011-06-01 MED ORDER — DIPHENHYDRAMINE HCL 50 MG/ML IJ SOLN
12.5000 mg | Freq: Once | INTRAMUSCULAR | Status: AC
  Administered 2011-06-01: 12.5 mg via INTRAVENOUS
  Filled 2011-06-01: qty 1

## 2011-06-01 MED ORDER — HYDROMORPHONE HCL PF 1 MG/ML IJ SOLN: 0.5000 mg | Freq: Once | INTRAMUSCULAR | Status: DC

## 2011-06-01 NOTE — ED Notes (Signed)
Main lab called specimen has been hemolyzed and needs to be recollected.  RN and MD made aware.

## 2011-06-01 NOTE — ED Notes (Signed)
Pt. Having n/v/d/ for 2 weeks, also has a cyst on her ovary

## 2011-06-01 NOTE — ED Notes (Signed)
Pt given gingerale and [place] sandwich for po challenge

## 2011-06-01 NOTE — ED Notes (Signed)
IV team at bedside to place PIV. Iv team RN unsucessful. States she will page other IV team RN.

## 2011-06-01 NOTE — ED Provider Notes (Signed)
History     CSN: [id number] Arrival date & time: 06/01/2011  7:17 AM   First MD Initiated Contact with Patient 06/01/11 0932      Chief Complaint  Patient presents with  . Abdominal Pain    n/v/d for 2 weeks    (Consider location/radiation/quality/duration/timing/severity/associated sxs/prior treatment) Patient is a 30 y.o. female presenting with abdominal pain. The history is provided by the patient.  Abdominal Pain The primary symptoms of the illness include abdominal pain. The primary symptoms of the illness do not include fever, fatigue, shortness of breath, nausea, vomiting, diarrhea, hematemesis, hematochezia, dysuria, vaginal discharge or vaginal bleeding.  The patient states that she believes she is currently not pregnant. The patient has not had a change in bowel habit. Symptoms associated with the illness do not include chills, anorexia, diaphoresis, heartburn, constipation, hematuria, frequency or back pain.    Abdominal pain is what brought patient to the ED. The patient has chronic pancreatitis with a hx of ovarian cysts. The pt does not have a uterus or gall bladder anymore. Pt has a doctor which she goes to for her chronic abdominal pain and has an appointment on the 17th. Pt was at the ED 1 week ago for the same, same exact symptoms. States that she has not gotten any better because the Zofran is not working.  Past Medical History  Diagnosis Date  . Pancreatitis   . S/P laparoscopic cholecystectomy   . Fibroids     History reviewed. No pertinent past surgical history.  Family History  Problem Relation Age of Onset  . Diabetes Mother   . Hypertension Mother   . Hypertension Father   . Diabetes Father     History  Substance Use Topics  . Smoking status: Former Games developer  . Smokeless tobacco: Not on file  . Alcohol Use: No    OB History    Grav Para Term Preterm Abortions TAB SAB Ect Mult Living                  Review of Systems  Constitutional:  Negative for fever, chills, diaphoresis and fatigue.  Respiratory: Negative for shortness of breath.   Gastrointestinal: Positive for abdominal pain. Negative for heartburn, nausea, vomiting, diarrhea, constipation, hematochezia, anorexia and hematemesis.  Genitourinary: Negative for dysuria, frequency, hematuria, vaginal bleeding and vaginal discharge.  Musculoskeletal: Negative for back pain.  All other systems reviewed and are negative.    Allergies  Celecoxib; Codeine; Ketorolac tromethamine; Meperidine hcl; Morphine; Propoxyphene n-acetaminophen; and Tramadol hcl  Home Medications   Current Outpatient Rx  Name Route Sig Dispense Refill  . DOXYCYCLINE HYCLATE 100 MG PO CAPS Oral Take 1 capsule (100 mg total) by mouth 2 (two) times daily. 14 capsule 0  . ESTRADIOL 2 MG PO TABS Oral Take 2 mg by mouth at bedtime.      . OXYCODONE-ACETAMINOPHEN 5-325 MG PO TABS Oral Take 2 tablets by mouth every 4 (four) hours as needed. For pain       BP 116/77  Pulse 83  Temp(Src) 97.7 F (36.5 C) (Oral)  Resp 16  SpO2 100%  LMP 06/01/2011  Physical Exam  Nursing note and vitals reviewed. Constitutional: She appears well-developed and well-nourished.  HENT:  Head: Normocephalic and atraumatic.  Eyes: Conjunctivae are normal. Pupils are equal, round, and reactive to light.  Neck: Trachea normal, normal range of motion and full passive range of motion without pain. Neck supple.  Cardiovascular: Normal rate, regular rhythm and normal pulses.  Pulmonary/Chest: Effort normal and breath sounds normal. Chest wall is not dull to percussion. She exhibits no tenderness, no crepitus, no edema, no deformity and no retraction.  Abdominal: Soft. Normal appearance and bowel sounds are normal. She exhibits no distension and no mass. There is tenderness (mid epigastric tenderness.). There is no rebound and no guarding.  Musculoskeletal: Normal range of motion.  Neurological: She is alert. She has normal  strength.  Skin: Skin is warm, dry and intact.  Psychiatric: She has a normal mood and affect. Her speech is normal and behavior is normal. Judgment and thought content normal. Cognition and memory are normal.    ED Course  Procedures (including critical care time)  Labs Reviewed  URINALYSIS, ROUTINE W REFLEX MICROSCOPIC - Abnormal; Notable for the following:    Appearance CLOUDY (*)    Hgb urine dipstick TRACE (*)    All other components within normal limits  URINE MICROSCOPIC-ADD ON - Abnormal; Notable for the following:    Squamous Epithelial / LPF MANY (*)    Bacteria, UA MANY (*)    All other components within normal limits  COMPREHENSIVE METABOLIC PANEL  LIPASE, BLOOD  PREGNANCY, URINE   No results found.   No diagnosis found.    MDM  Pt requesting pain medications and phenergan.  Pt is not dehydrated by lab or clinically. Will give stronger nausea medication and have pt follow-up with PCP on Dec 17.        Dorthula Matas, PA 06/01/11 1202

## 2011-06-01 NOTE — ED Notes (Signed)
Pt stating shes had nausea, vomiting, diarrhea for last two weeks. Mucous membranes appear dry. Reports known right ovarian cyst.

## 2011-06-01 NOTE — ED Notes (Signed)
Pt ate half of a [place] sandwich and drank 8 oz of gingerale without any reported nausea or vomiting. Discharge papers reviewed with pt. Pt verbalized understanding.

## 2011-06-01 NOTE — ED Notes (Signed)
Pt is known difficult stick, paged IV team to start PIV after 2 unsucessful attempts by this RN

## 2011-06-01 NOTE — ED Provider Notes (Signed)
Medical screening examination/treatment/procedure(s) were performed by non-physician practitioner and as supervising physician I was immediately available for consultation/collaboration.   Nat Christen, MD 06/01/11 [phone]

## 2011-06-07 ENCOUNTER — Ambulatory Visit: Payer: Medicaid Other | Admitting: Advanced Practice Midwife

## 2011-06-10 ENCOUNTER — Emergency Department (HOSPITAL_COMMUNITY)
Admission: EM | Admit: 2011-06-10 | Discharge: 2011-06-11 | Disposition: A | Discharge status: Home / Self Care | Payer: Medicaid Other | Attending: Emergency Medicine | Admitting: Emergency Medicine

## 2011-06-10 ENCOUNTER — Encounter (HOSPITAL_COMMUNITY): Payer: Self-pay | Admitting: Emergency Medicine

## 2011-06-10 DIAGNOSIS — R1031 Right lower quadrant pain: Secondary | ICD-10-CM | POA: Insufficient documentation

## 2011-06-10 DIAGNOSIS — R197 Diarrhea, unspecified: Secondary | ICD-10-CM | POA: Insufficient documentation

## 2011-06-10 DIAGNOSIS — R109 Unspecified abdominal pain: Secondary | ICD-10-CM

## 2011-06-10 DIAGNOSIS — R1013 Epigastric pain: Secondary | ICD-10-CM | POA: Insufficient documentation

## 2011-06-10 DIAGNOSIS — G8929 Other chronic pain: Secondary | ICD-10-CM | POA: Insufficient documentation

## 2011-06-10 DIAGNOSIS — R112 Nausea with vomiting, unspecified: Secondary | ICD-10-CM | POA: Insufficient documentation

## 2011-06-10 NOTE — ED Notes (Signed)
PT. REPORTS CHRONIC UPPER AND LOWER ABDOMINAL PAIN / LOW BACK PAIN FOR SEVERAL MONTHS  , WITH VOMITTING AND DIARRHEA.

## 2011-06-11 ENCOUNTER — Emergency Department (HOSPITAL_COMMUNITY): Payer: Medicaid Other

## 2011-06-11 ENCOUNTER — Encounter (HOSPITAL_COMMUNITY): Payer: Self-pay | Admitting: Radiology

## 2011-06-11 LAB — CBC
Hemoglobin: 13.1 g/dL (ref 12.0–15.0)
RBC: 4.66 MIL/uL (ref 3.87–5.11)

## 2011-06-11 LAB — URINALYSIS, ROUTINE W REFLEX MICROSCOPIC
Bilirubin Urine: NEGATIVE
Glucose, UA: NEGATIVE mg/dL
Ketones, ur: NEGATIVE mg/dL
Nitrite: NEGATIVE
pH: 6 (ref 5.0–8.0)

## 2011-06-11 LAB — DIFFERENTIAL
Basophils Relative: 0 % (ref 0–1)
Lymphs Abs: 3.6 10*3/uL (ref 0.7–4.0)
Monocytes Relative: 7 % (ref 3–12)
Neutro Abs: 4.8 10*3/uL (ref 1.7–7.7)
Neutrophils Relative %: 51 % (ref 43–77)

## 2011-06-11 LAB — URINE MICROSCOPIC-ADD ON

## 2011-06-11 LAB — COMPREHENSIVE METABOLIC PANEL
ALT: 16 U/L (ref 0–35)
Albumin: 4.1 g/dL (ref 3.5–5.2)
Alkaline Phosphatase: 68 U/L (ref 39–117)
BUN: 17 mg/dL (ref 6–23)
Chloride: 103 mEq/L (ref 96–112)
Glucose, Bld: 83 mg/dL (ref 70–99)
Potassium: 3.6 mEq/L (ref 3.5–5.1)
Total Bilirubin: 0.2 mg/dL — ABNORMAL LOW (ref 0.3–1.2)

## 2011-06-11 LAB — LIPASE, BLOOD: Lipase: 65 U/L — ABNORMAL HIGH (ref 11–59)

## 2011-06-11 MED ORDER — ONDANSETRON HCL 4 MG/2ML IJ SOLN
4.0000 mg | Freq: Once | INTRAMUSCULAR | Status: AC
  Administered 2011-06-11: 4 mg via INTRAVENOUS
  Filled 2011-06-11: qty 2

## 2011-06-11 MED ORDER — DIPHENHYDRAMINE HCL 50 MG/ML IJ SOLN
25.0000 mg | Freq: Once | INTRAMUSCULAR | Status: AC
  Administered 2011-06-11: 25 mg via INTRAVENOUS
  Filled 2011-06-11: qty 1

## 2011-06-11 MED ORDER — ONDANSETRON 4 MG PO TBDP
4.0000 mg | ORAL_TABLET | Freq: Once | ORAL | Status: DC
  Filled 2011-06-11: qty 1

## 2011-06-11 MED ORDER — SODIUM CHLORIDE 0.9 % IV BOLUS (SEPSIS)
1000.0000 mL | Freq: Once | INTRAVENOUS | Status: AC
  Administered 2011-06-11: 1000 mL via INTRAVENOUS

## 2011-06-11 MED ORDER — PROMETHAZINE HCL 25 MG/ML IJ SOLN
25.0000 mg | Freq: Once | INTRAMUSCULAR | Status: DC
  Filled 2011-06-11: qty 1

## 2011-06-11 MED ORDER — HYDROMORPHONE HCL PF 2 MG/ML IJ SOLN
2.0000 mg | Freq: Once | INTRAMUSCULAR | Status: AC
  Administered 2011-06-11: 2 mg via INTRAVENOUS
  Filled 2011-06-11: qty 1

## 2011-06-11 MED ORDER — DIPHENHYDRAMINE HCL 50 MG/ML IJ SOLN
25.0000 mg | Freq: Once | INTRAMUSCULAR | Status: AC
  Administered 2011-06-11: 25 mg via INTRAMUSCULAR
  Filled 2011-06-11: qty 1

## 2011-06-11 MED ORDER — OXYCODONE-ACETAMINOPHEN 5-325 MG PO TABS
2.0000 | ORAL_TABLET | Freq: Once | ORAL | Status: DC
  Filled 2011-06-11: qty 2

## 2011-06-11 MED ORDER — HYDROMORPHONE HCL PF 1 MG/ML IJ SOLN
1.0000 mg | Freq: Once | INTRAMUSCULAR | Status: AC
  Administered 2011-06-11: 1 mg via INTRAMUSCULAR
  Filled 2011-06-11: qty 1

## 2011-06-11 MED ORDER — HYDROMORPHONE HCL PF 1 MG/ML IJ SOLN
1.0000 mg | Freq: Once | INTRAMUSCULAR | Status: AC
  Administered 2011-06-11: 1 mg via INTRAVENOUS
  Filled 2011-06-11: qty 1

## 2011-06-11 NOTE — ED Notes (Signed)
CT called--patient has only a 22G in left thumb and unable to use powerport on such. CT to talk to Dr. Manus Gunning.

## 2011-06-11 NOTE — ED Notes (Signed)
Returned to stretcher--CT without IV contrast completed.

## 2011-06-11 NOTE — ED Provider Notes (Signed)
History     CSN: [id number] Arrival date & time: 06/10/2011 10:43 PM   First MD Initiated Contact with Patient 06/11/11 [phone]      Chief Complaint  Patient presents with  . Abdominal Pain    (Consider location/radiation/quality/duration/timing/severity/associated sxs/prior treatment) HPI Comments: Patient presents with worsening of her chronic abdominal pain and she's had for several months worse in the past couple days. It is in her epigastrium and her right lower quadrant. She states she has a history of pancreatitis as well as ovarian cysts in her lower quadrant is worse than usual. Associated with nausea, vomiting diarrhea. She's been unable to keep down her medications at home. She denies any fever, vaginal bleeding or discharge. She denies any dysuria hematuria. She has a callous episodes of loose stools are nonbloody. Her emesis is nonbilious and nonbloody. She's had a hysterectomy and no longer has menstrual periods.  The history is provided by the patient.    Past Medical History  Diagnosis Date  . Pancreatitis   . S/P laparoscopic cholecystectomy   . Fibroids     History reviewed. No pertinent past surgical history.  Family History  Problem Relation Age of Onset  . Diabetes Mother   . Hypertension Mother   . Hypertension Father   . Diabetes Father     History  Substance Use Topics  . Smoking status: Former Games developer  . Smokeless tobacco: Not on file  . Alcohol Use: No    OB History    Grav Para Term Preterm Abortions TAB SAB Ect Mult Living                  Review of Systems  Constitutional: Negative for fever, activity change and appetite change.  HENT: Negative for congestion and rhinorrhea.   Respiratory: Negative for cough, chest tightness and shortness of breath.   Gastrointestinal: Positive for nausea, vomiting, abdominal pain and diarrhea.  Genitourinary: Negative for hematuria and dyspareunia.  Musculoskeletal: Positive for back pain.  Skin:  Negative for rash.  Neurological: Negative for weakness and headaches.    Allergies  Celecoxib; Codeine; Ketorolac tromethamine; Meperidine hcl; Morphine; Propoxyphene n-acetaminophen; and Tramadol hcl  Home Medications   Current Outpatient Rx  Name Route Sig Dispense Refill  . DOXYCYCLINE HYCLATE 100 MG PO CAPS Oral Take 1 capsule (100 mg total) by mouth 2 (two) times daily. 14 capsule 0  . ESTRADIOL 2 MG PO TABS Oral Take 2 mg by mouth at bedtime.      . OXYCODONE-ACETAMINOPHEN 5-325 MG PO TABS Oral Take 1 tablet by mouth every 6 (six) hours as needed for pain. 8 tablet 0    BP 125/88  Pulse 83  Temp(Src) 97.2 F (36.2 C) (Oral)  Resp 20  SpO2 97%  LMP 06/01/2011  Physical Exam  Constitutional: She is oriented to person, place, and time. She appears well-developed and well-nourished. No distress.  HENT:  Head: Normocephalic and atraumatic.  Mouth/Throat: Oropharynx is clear and moist. No oropharyngeal exudate.  Eyes: Conjunctivae are normal. Pupils are equal, round, and reactive to light.  Neck: Normal range of motion.  Cardiovascular: Normal rate, regular rhythm and normal heart sounds.   Pulmonary/Chest: Effort normal and breath sounds normal. No respiratory distress.  Abdominal: Soft. There is tenderness. There is no rebound and no guarding.       Tenderness to palpation in the epigastric, right lower quadrant.  No pain at McBurney's  Musculoskeletal: Normal range of motion. She exhibits no edema and no tenderness.  Negative obturator sign  Neurological: She is alert and oriented to person, place, and time. No cranial nerve deficit.  Skin: Skin is warm.    ED Course  Procedures (including critical care time)  Labs Reviewed  COMPREHENSIVE METABOLIC PANEL - Abnormal; Notable for the following:    Total Bilirubin 0.2 (*)    All other components within normal limits  LIPASE, BLOOD - Abnormal; Notable for the following:    Lipase 65 (*)    All other components  within normal limits  URINALYSIS, ROUTINE W REFLEX MICROSCOPIC - Abnormal; Notable for the following:    APPearance TURBID (*)    Hgb urine dipstick TRACE (*)    Leukocytes, UA SMALL (*)    All other components within normal limits  URINE MICROSCOPIC-ADD ON - Abnormal; Notable for the following:    Squamous Epithelial / LPF MANY (*)    Bacteria, UA MANY (*)    All other components within normal limits  CBC  DIFFERENTIAL  PREGNANCY, URINE   Ct Abdomen Pelvis Wo Contrast  06/11/2011  *RADIOLOGY REPORT*  Clinical Data: Abdominal pain.  CT ABDOMEN AND PELVIS WITHOUT CONTRAST  Technique:  Multidetector CT imaging of the abdomen and pelvis was performed following the standard protocol without intravenous contrast.  Comparison: CT of the abdomen and pelvis performed 09/22/2010, and abdominal ultrasound performed 04/28/2011  Findings: The visualized lung bases are clear.  The liver and spleen are unremarkable in appearance.  The patient is status post cholecystectomy, with clips noted at the gallbladder fossa.  The pancreas is unremarkable in appearance.  The adrenal glands are normal in appearance.  The kidneys are unremarkable in appearance.  There is no evidence of hydronephrosis.  No renal or ureteral stones are seen.  No perinephric stranding is appreciated.  No free fluid is identified.  The small bowel is unremarkable in appearance, aside from minimal fecalization of the distal ileum. The stomach is within normal limits.  No acute vascular abnormalities are seen.  The mesenteric nodes are borderline normal in appearance; mild haziness within the mesentery is thought to reflect vasculature.  This appears stable from the prior CT.  The appendix is normal in caliber, without evidence for appendicitis.  The colon is partially filled with stool and is grossly unremarkable in appearance.  The bladder is mildly distended and grossly unremarkable in appearance.  The patient is status post hysterectomy; no  suspicious adnexal masses are seen.  The ovaries are not definitely characterized.  Scattered soft tissue prominence along the pelvic sidewalls appears to reflect vasculature.  No inguinal lymphadenopathy is seen.  No acute osseous abnormalities are identified.  IMPRESSION: No acute abnormalities identified within the abdomen or pelvis.  Original Report Authenticated By: Tonia Ghent, M.D.     1. Chronic abdominal pain       MDM  Acute on chronic abdominal pain associated with nausea, vomiting and diarrhea. Vitals Signs are stable abdomen is soft with minimal epigastric and right lower quadrant tenderness.  Labs reviewed, urinalysis is contaminated. Otherwise unremarkable. Lipase is within her normal range.  Patient is quite demanding of IV pain medications and states that she cannot take pills because they make her hurt more. She's not had any vomiting in the ED.  I explained the patient that she would not be receiving IV or IM medications at home so she is taking his by mouth fluid to insure that she will tolerate them.  She has follow up with [facility] and GI later this  month.     Glynn Octave, MD 06/11/11 [phone]

## 2011-06-11 NOTE — ED Notes (Signed)
Pt. Refusing PO meds now-wanting IV. "I told him it hurts to swallow pills-they make me hurt more" and puts her hand over abdomen.  Refusing to have IV removed until Dr. Manus Gunning comes and talks to her.  Charge Nurse made aware.

## 2011-06-17 ENCOUNTER — Encounter (HOSPITAL_COMMUNITY): Payer: Self-pay | Admitting: Nurse Practitioner

## 2011-06-17 ENCOUNTER — Emergency Department (HOSPITAL_COMMUNITY)
Admission: EM | Admit: 2011-06-17 | Discharge: 2011-06-17 | Disposition: A | Discharge status: Home / Self Care | Payer: Medicaid Other | Attending: Emergency Medicine | Admitting: Emergency Medicine

## 2011-06-17 DIAGNOSIS — G8929 Other chronic pain: Secondary | ICD-10-CM | POA: Insufficient documentation

## 2011-06-17 DIAGNOSIS — R630 Anorexia: Secondary | ICD-10-CM | POA: Insufficient documentation

## 2011-06-17 DIAGNOSIS — R197 Diarrhea, unspecified: Secondary | ICD-10-CM

## 2011-06-17 DIAGNOSIS — R112 Nausea with vomiting, unspecified: Secondary | ICD-10-CM | POA: Insufficient documentation

## 2011-06-17 DIAGNOSIS — R109 Unspecified abdominal pain: Secondary | ICD-10-CM

## 2011-06-17 LAB — URINALYSIS, ROUTINE W REFLEX MICROSCOPIC
Protein, ur: NEGATIVE mg/dL
Specific Gravity, Urine: 1.027 (ref 1.005–1.030)
Urobilinogen, UA: 0.2 mg/dL (ref 0.0–1.0)

## 2011-06-17 LAB — COMPREHENSIVE METABOLIC PANEL
ALT: 52 U/L — ABNORMAL HIGH (ref 0–35)
AST: 19 U/L (ref 0–37)
CO2: 24 mEq/L (ref 19–32)
Calcium: 9.9 mg/dL (ref 8.4–10.5)
Creatinine, Ser: 0.62 mg/dL (ref 0.50–1.10)
GFR calc Af Amer: >90 mL/min (ref >90)
GFR calc non Af Amer: >90 mL/min (ref >90)
Glucose, Bld: 83 mg/dL (ref 70–99)
Sodium: 140 mEq/L (ref 135–145)
Total Protein: 9 g/dL — ABNORMAL HIGH (ref 6.0–8.3)

## 2011-06-17 LAB — URINE MICROSCOPIC-ADD ON

## 2011-06-17 LAB — CBC
MCH: 26.5 pg (ref 26.0–34.0)
MCHC: 32.2 g/dL (ref 30.0–36.0)
MCV: 82.1 fL (ref 78.0–100.0)
Platelets: 294 10*3/uL (ref 150–400)

## 2011-06-17 MED ORDER — DIPHENHYDRAMINE HCL 50 MG/ML IJ SOLN
25.0000 mg | Freq: Once | INTRAMUSCULAR | Status: AC
  Administered 2011-06-17: 25 mg via INTRAMUSCULAR
  Filled 2011-06-17: qty 1

## 2011-06-17 MED ORDER — METOCLOPRAMIDE HCL 5 MG/ML IJ SOLN: 10.0000 mg | Freq: Once | INTRAMUSCULAR | Status: DC

## 2011-06-17 MED ORDER — HYDROCODONE-ACETAMINOPHEN 5-325 MG PO TABS: 1.0000 | ORAL_TABLET | ORAL | Status: DC | PRN

## 2011-06-17 MED ORDER — ONDANSETRON HCL 4 MG PO TABS: 8.0000 mg | ORAL_TABLET | Freq: Four times a day (QID) | ORAL | Status: DC

## 2011-06-17 MED ORDER — METOCLOPRAMIDE HCL 5 MG/ML IJ SOLN
10.0000 mg | Freq: Once | INTRAMUSCULAR | Status: DC
  Filled 2011-06-17 (×2): qty 2

## 2011-06-17 MED ORDER — OXYCODONE-ACETAMINOPHEN 5-325 MG PO TABS
1.0000 | ORAL_TABLET | Freq: Once | ORAL | Status: AC
  Administered 2011-06-17: 1 via ORAL
  Filled 2011-06-17: qty 1

## 2011-06-17 MED ORDER — ONDANSETRON 8 MG PO TBDP: 8.0000 mg | ORAL_TABLET | Freq: Three times a day (TID) | ORAL | Status: DC | PRN

## 2011-06-17 NOTE — ED Notes (Signed)
I ask the patient if she could get [place] an urine sample she frused to get an sample

## 2011-06-17 NOTE — ED Notes (Signed)
Pt here with c/o upper abd pain that has been going on for 5 weeks.  Pt reports n/v/d as well as being dx with ovarian cyst and pancreatitis.  Pt rates pain 9/10 at this time.

## 2011-06-17 NOTE — ED Provider Notes (Signed)
History     CSN: [id number] Arrival date & time: 06/17/2011  1:42 PM   First MD Initiated Contact with Patient 06/17/11 1432      Chief Complaint  Patient presents with  . Abdominal Pain    (Consider location/radiation/quality/duration/timing/severity/associated sxs/prior treatment) HPI History provided by pt and prior chart.  Pt c/o acute on chronic abdominal pain.  Located in LUQ, epigastrium and RLQ.   Associated w/ N/V/D; no GU sx.  Unable to tolerate pos nor suppositories.  Pt reports that she was diagnosed w/ pancreatitis in the ED earlier this week and was offered admission but requested discharge.  Attributes RLQ pain to chronic ovarian cysts.  Per prior chart, pt seen in ED for same sx on 06/10/11.  Labs sig for slightly elevated lipase at 65 and CT abd/pelvis neg.  Pt was demanding of IV medications and stated that she could not take pills because they aggravate her pain.  She has a f/u appt w/ PCP and GI at the end of the month. Past abd surgeries include cholecystectomy and partial hysterectomy.   Past Medical History  Diagnosis Date  . Pancreatitis   . S/P laparoscopic cholecystectomy   . Fibroids     History reviewed. No pertinent past surgical history.  Family History  Problem Relation Age of Onset  . Diabetes Mother   . Hypertension Mother   . Hypertension Father   . Diabetes Father     History  Substance Use Topics  . Smoking status: Former Games developer  . Smokeless tobacco: Not on file  . Alcohol Use: No    OB History    Grav Para Term Preterm Abortions TAB SAB Ect Mult Living                  Review of Systems  All other systems reviewed and are negative.    Allergies  Celecoxib; Codeine; Ketorolac tromethamine; Meperidine hcl; Morphine; Propoxyphene n-acetaminophen; and Tramadol hcl  Home Medications   Current Outpatient Rx  Name Route Sig Dispense Refill  . ESTRADIOL 2 MG PO TABS Oral Take 2 mg by mouth at bedtime.        BP 112/69  Pulse  81  Temp(Src) 98.2 F (36.8 C) (Oral)  Resp 16  Ht 5\' 5"  (1.651 m)  Wt 130 lb (58.968 kg)  BMI 21.63 kg/m2  SpO2 99%  LMP 06/01/2011  Physical Exam  Nursing note and vitals reviewed. Constitutional: She is oriented to person, place, and time. She appears well-developed and well-nourished. No distress.  HENT:  Head: Normocephalic and atraumatic.  Eyes:       Normal appearance  Neck: Normal range of motion.  Cardiovascular: Normal rate and regular rhythm.   Pulmonary/Chest: Effort normal and breath sounds normal.  Abdominal: Soft. Bowel sounds are normal. She exhibits no distension and no mass. There is no tenderness. There is no rebound and no guarding.       Mild LUQ and epigastric ttp.  RLQ tenderness when not distracted.  R CVA tenderness.   Neurological: She is alert and oriented to person, place, and time.  Skin: Skin is warm and dry. No rash noted.  Psychiatric: She has a normal mood and affect. Her behavior is normal.    ED Course  Procedures (including critical care time)  Labs Reviewed  CBC - Abnormal; Notable for the following:    RBC 5.14 (*)    All other components within normal limits  COMPREHENSIVE METABOLIC PANEL - Abnormal; Notable for  the following:    Total Protein 9.0 (*)    ALT 52 (*)    All other components within normal limits  LIPASE, BLOOD  URINALYSIS, ROUTINE W REFLEX MICROSCOPIC   No results found.   1. Chronic abdominal pain   2. Nausea vomiting and diarrhea       MDM  Pt presents w/ c/o chronic abd pain and N/V/D.  Unable to tolerate pos.  Pt has been seen for the same complaint several times in the past, most recently on 12/5 w/ a negative work-up, including CT abd/pelvis.  Pt was demanding of IV medication at that time.  On exam, afebrile, no signs of dehydration, abd benign but tender.  Pt requests an IV because she is dehydrated and won't be able to tolerate po medications.  She refuses IM reglan because she is in too much pain.  Labs  unremarkable w/ exception of urine which she can not provide because "too dehydrated".  Has not vomited in ED.  Will give her 1 po percocet and if she tolerates it, d/c home to f/u with GI.  I have contacted [facility] in [place] and they have scheduled an appt for her on 12/27 at 3:45.  Pt notified.    Pt tolerated percocet but reports that it only brought her pain down from 9 to 8/10.  She then requested something to eat and tolerated a [place] sandwich.  Discharged home.        Arie Sabina [place], [place] 06/17/11 1734

## 2011-06-17 NOTE — ED Notes (Signed)
C/o abd pain x 5 weeks. Reports n/v/d/unable to tolerate oral intake. Also c/o pain from ovarian cyst.

## 2011-06-18 NOTE — ED Provider Notes (Signed)
Medical screening examination/treatment/procedure(s) were performed by non-physician practitioner and as supervising physician I was immediately available for consultation/collaboration.   Gwyneth Sprout, MD 06/18/11 [phone]

## 2011-06-23 ENCOUNTER — Encounter (HOSPITAL_COMMUNITY): Payer: Self-pay | Admitting: *Deleted

## 2011-06-23 ENCOUNTER — Emergency Department (HOSPITAL_COMMUNITY)
Admission: EM | Admit: 2011-06-23 | Discharge: 2011-06-23 | Disposition: A | Discharge status: Home / Self Care | Payer: Medicaid Other | Attending: Emergency Medicine | Admitting: Emergency Medicine

## 2011-06-23 DIAGNOSIS — R109 Unspecified abdominal pain: Secondary | ICD-10-CM | POA: Insufficient documentation

## 2011-06-23 DIAGNOSIS — F17200 Nicotine dependence, unspecified, uncomplicated: Secondary | ICD-10-CM | POA: Insufficient documentation

## 2011-06-23 DIAGNOSIS — G8929 Other chronic pain: Secondary | ICD-10-CM | POA: Insufficient documentation

## 2011-06-23 LAB — URINE MICROSCOPIC-ADD ON

## 2011-06-23 LAB — URINALYSIS, ROUTINE W REFLEX MICROSCOPIC
Bilirubin Urine: NEGATIVE
Glucose, UA: NEGATIVE mg/dL
Ketones, ur: 15 mg/dL — AB
Specific Gravity, Urine: 1.031 — ABNORMAL HIGH (ref 1.005–1.030)
pH: 5.5 (ref 5.0–8.0)

## 2011-06-23 MED ORDER — HYDROMORPHONE HCL PF 2 MG/ML IJ SOLN
2.0000 mg | Freq: Once | INTRAMUSCULAR | Status: AC
  Administered 2011-06-23: 2 mg via INTRAMUSCULAR
  Filled 2011-06-23: qty 1

## 2011-06-23 MED ORDER — DIPHENHYDRAMINE HCL 25 MG PO CAPS
25.0000 mg | ORAL_CAPSULE | Freq: Once | ORAL | Status: AC
  Administered 2011-06-23: 25 mg via ORAL
  Filled 2011-06-23: qty 1

## 2011-06-23 NOTE — ED Notes (Signed)
Patient is resting, NAD. No active vomiting or diarrhea presently.

## 2011-06-23 NOTE — ED Notes (Signed)
C/o upper abd pain, n/v/d x 1 month. Denies fever

## 2011-06-23 NOTE — ED Notes (Signed)
Reports saw a "pancreas specialist" last September in [place].

## 2011-06-23 NOTE — ED Provider Notes (Addendum)
History     CSN: [id number] Arrival date & time: 06/23/2011 10:05 AM   First MD Initiated Contact with Patient 06/23/11 1011      Chief Complaint  Patient presents with  . Abdominal Pain     HPI Patient presents with a long-standing history of chronic upper abdominal pain with episode of pancreatitis many years ago.  She is currently in the process of being seen by a chronic pain specialist as well as a gastroenterologist in [place].  She says currently that did not have her on any pain medications because they're obtaining her records and determine what is the best course of therapy.  Patient presents today with her typical symptoms of upper abdominal pain with nausea vomiting and occasional diarrhea.  She reports several episodes of diarrhea in the last 24 hours.  She reports nonbloody nonbilious vomiting and difficulty keeping oral Zofran down.  She reports she's been urinating normally without dysuria or urinary frequency.  She reports this pain is similar to her typical abdominal pain.  She does not have any pain medications at home.  Review the chart demonstrates the last 7 lipase is obtained were all normal with normal LFTs.  Nothing worsens her symptoms.  Nothing improves her symptoms.  Her symptoms are constant. Past Medical History  Diagnosis Date  . Pancreatitis   . S/P laparoscopic cholecystectomy   . Fibroids     Past Surgical History  Procedure Date  . Cholecystectomy   . Abdominal hysterectomy     Family History  Problem Relation Age of Onset  . Diabetes Mother   . Hypertension Mother   . Hypertension Father   . Diabetes Father     History  Substance Use Topics  . Smoking status: Current Everyday Smoker -- 0.5 packs/day for 10 years    Types: Cigarettes  . Smokeless tobacco: Not on file  . Alcohol Use: No    OB History    Grav Para Term Preterm Abortions TAB SAB Ect Mult Living                  Review of Systems  All other systems reviewed and are  negative.    Allergies  Celecoxib; Ketorolac tromethamine; Meperidine hcl; Codeine; Morphine; Propoxyphene n-acetaminophen; and Tramadol hcl  Home Medications   Current Outpatient Rx  Name Route Sig Dispense Refill  . ESTRADIOL 2 MG PO TABS Oral Take 2 mg by mouth at bedtime.        BP 127/83  Pulse 105  Temp(Src) 97.8 F (36.6 C) (Oral)  Resp 24  Ht 5\' 5"  (1.651 m)  SpO2 100%  LMP 06/01/2011  Physical Exam  Nursing note and vitals reviewed. Constitutional: She is oriented to person, place, and time. She appears well-developed and well-nourished. No distress.  HENT:  Head: Normocephalic and atraumatic.  Eyes: EOM are normal.  Neck: Normal range of motion.  Cardiovascular: Normal rate, regular rhythm and normal heart sounds.   Pulmonary/Chest: Effort normal and breath sounds normal.  Abdominal: Soft. She exhibits no distension. There is no tenderness.  Musculoskeletal: Normal range of motion.  Neurological: She is alert and oriented to person, place, and time.  Skin: Skin is warm and dry.  Psychiatric: She has a normal mood and affect. Judgment normal.    ED Course  Procedures (including critical care time)  Labs Reviewed  URINALYSIS, ROUTINE W REFLEX MICROSCOPIC - Abnormal; Notable for the following:    APPearance CLOUDY (*)    Specific Gravity, Urine  1.031 (*)    Hgb urine dipstick SMALL (*)    Ketones, ur 15 (*)    All other components within normal limits  URINE MICROSCOPIC-ADD ON - Abnormal; Notable for the following:    Squamous Epithelial / LPF MANY (*)    Bacteria, UA MANY (*)    All other components within normal limits   No results found.   1. Abdominal pain       MDM  This is likely her ongoing chronic abdominal pain.  Given her normal LFTs and lipase over the past one month the been obtained proximally 7 times he'll not obtain labs at this time.  She's had a hysterectomy and the serum pregnancy is not indicated.  A urinalysis will be sent.   She has no CVA tenderness to suggest pyelonephritis.  She be given antibiotics and encouraged to take oral fluids here her heart rate is noted to be 105 however clinically on exam she is not dehydrated appearing.  She has no significant abdominal tenderness to suggest abdominal free air bowel obstruction or other acute abdominal pathology.  I suspect this is her ongoing functional pain.  Her pain will be treated in the emergency department   12:19 PM The patient feels somewhat better at this time.  Her abdominal exam remains benign.  Her urinalysis is contaminated and shows no signs of urinary tract infection with 0-2 white blood cells.  Recommend close followup with her pain doctor and her gastroenterologist in [place], [place] 06/23/11 1220  Lyanne Co, MD 06/23/11 1220

## 2011-06-28 ENCOUNTER — Encounter (HOSPITAL_COMMUNITY): Payer: Self-pay | Admitting: *Deleted

## 2011-06-28 ENCOUNTER — Emergency Department (HOSPITAL_COMMUNITY)
Admission: EM | Admit: 2011-06-28 | Discharge: 2011-06-28 | Disposition: A | Discharge status: Home / Self Care | Payer: Medicaid Other | Attending: Emergency Medicine | Admitting: Emergency Medicine

## 2011-06-28 DIAGNOSIS — R10816 Epigastric abdominal tenderness: Secondary | ICD-10-CM | POA: Insufficient documentation

## 2011-06-28 DIAGNOSIS — R112 Nausea with vomiting, unspecified: Secondary | ICD-10-CM | POA: Insufficient documentation

## 2011-06-28 DIAGNOSIS — R197 Diarrhea, unspecified: Secondary | ICD-10-CM | POA: Insufficient documentation

## 2011-06-28 DIAGNOSIS — G8929 Other chronic pain: Secondary | ICD-10-CM | POA: Insufficient documentation

## 2011-06-28 DIAGNOSIS — K861 Other chronic pancreatitis: Secondary | ICD-10-CM | POA: Insufficient documentation

## 2011-06-28 DIAGNOSIS — F17200 Nicotine dependence, unspecified, uncomplicated: Secondary | ICD-10-CM | POA: Insufficient documentation

## 2011-06-28 DIAGNOSIS — R1013 Epigastric pain: Secondary | ICD-10-CM | POA: Insufficient documentation

## 2011-06-28 LAB — URINE MICROSCOPIC-ADD ON

## 2011-06-28 LAB — URINALYSIS, ROUTINE W REFLEX MICROSCOPIC
Glucose, UA: NEGATIVE mg/dL
Ketones, ur: NEGATIVE mg/dL
Leukocytes, UA: NEGATIVE
Protein, ur: 30 mg/dL — AB
Urobilinogen, UA: 1 mg/dL (ref 0.0–1.0)

## 2011-06-28 MED ORDER — DIPHENHYDRAMINE HCL 25 MG PO CAPS
50.0000 mg | ORAL_CAPSULE | Freq: Once | ORAL | Status: AC
  Administered 2011-06-28: 50 mg via ORAL
  Filled 2011-06-28 (×2): qty 1

## 2011-06-28 MED ORDER — ONDANSETRON 4 MG PO TBDP
4.0000 mg | ORAL_TABLET | Freq: Once | ORAL | Status: AC
  Administered 2011-06-28: 4 mg via ORAL
  Filled 2011-06-28: qty 1

## 2011-06-28 MED ORDER — OXYCODONE-ACETAMINOPHEN 5-325 MG PO TABS: 1.0000 | ORAL_TABLET | Freq: Three times a day (TID) | ORAL | Status: DC | PRN

## 2011-06-28 MED ORDER — OXYCODONE-ACETAMINOPHEN 5-325 MG PO TABS
2.0000 | ORAL_TABLET | Freq: Once | ORAL | Status: AC
  Administered 2011-06-28: 2 via ORAL
  Filled 2011-06-28: qty 2

## 2011-06-28 NOTE — ED Provider Notes (Signed)
History     CSN: [id number]  Arrival date & time 06/28/11  1912   First MD Initiated Contact with Patient 06/28/11 2030      Chief Complaint  Patient presents with  . Pancreatitis    (Consider location/radiation/quality/duration/timing/severity/associated sxs/prior treatment) Patient is a 30 y.o. female presenting with abdominal pain. The history is provided by the patient.  Abdominal Pain The primary symptoms of the illness include abdominal pain, nausea, vomiting (trace blood at end of vomiting) and diarrhea (trace blood at end). The primary symptoms of the illness do not include fever, dysuria, vaginal discharge or vaginal bleeding. The current episode started more than 2 days ago. The onset of the illness was gradual. The problem has not changed since onset. The abdominal pain began more than 2 days ago. The pain came on gradually. The abdominal pain has been unchanged since its onset. The abdominal pain is located in the epigastric region. The abdominal pain does not radiate. The severity of the abdominal pain is 6/10. The abdominal pain is relieved by nothing. The abdominal pain is exacerbated by vomiting.  Associated with: Chronic pancreatitis. The patient states that she believes she is currently not pregnant. The patient has not had a change in bowel habit.    Past Medical History  Diagnosis Date  . Pancreatitis   . S/P laparoscopic cholecystectomy   . Fibroids     Past Surgical History  Procedure Date  . Cholecystectomy   . Abdominal hysterectomy     Family History  Problem Relation Age of Onset  . Diabetes Mother   . Hypertension Mother   . Hypertension Father   . Diabetes Father     History  Substance Use Topics  . Smoking status: Current Everyday Smoker -- 0.5 packs/day for 10 years    Types: Cigarettes  . Smokeless tobacco: Not on file  . Alcohol Use: No    OB History    Grav Para Term Preterm Abortions TAB SAB Ect Mult Living                   Review of Systems  Constitutional: Negative for fever.  Gastrointestinal: Positive for nausea, vomiting (trace blood at end of vomiting), abdominal pain and diarrhea (trace blood at end).  Genitourinary: Negative for dysuria, vaginal bleeding and vaginal discharge.  All other systems reviewed and are negative.    Allergies  Celecoxib; Ketorolac tromethamine; Meperidine hcl; Codeine; Morphine; Propoxyphene n-acetaminophen; and Tramadol hcl  Home Medications   Current Outpatient Rx  Name Route Sig Dispense Refill  . ESTRADIOL 2 MG PO TABS Oral Take 2 mg by mouth at bedtime.        BP 112/75  Pulse 90  Temp(Src) 98.9 F (37.2 C) (Oral)  Resp 16  Ht 5\' 5"  (1.651 m)  SpO2 94%  LMP 06/01/2011  Physical Exam  Nursing note and vitals reviewed. Constitutional: She is oriented to person, place, and time. She appears well-developed and well-nourished. No distress.  HENT:  Head: Normocephalic and atraumatic.  Eyes: EOM are normal. Pupils are equal, round, and reactive to light.  Neck: Normal range of motion. Neck supple.  Cardiovascular: Normal rate and regular rhythm.  Exam reveals no friction rub.   No murmur heard. Pulmonary/Chest: Effort normal and breath sounds normal. No respiratory distress. She has no wheezes. She has no rales.  Abdominal: Soft. She exhibits no distension. There is tenderness (epigastric). There is no rebound.  Musculoskeletal: Normal range of motion. She exhibits no edema.  Neurological: She is alert and oriented to person, place, and time.  Skin: She is not diaphoretic.    ED Course  Procedures (including critical care time)  Labs Reviewed  URINALYSIS, ROUTINE W REFLEX MICROSCOPIC - Abnormal; Notable for the following:    APPearance TURBID (*)    Specific Gravity, Urine 1.039 (*)    Hgb urine dipstick SMALL (*)    Bilirubin Urine SMALL (*)    Protein, ur 30 (*)    All other components within normal limits  URINE MICROSCOPIC-ADD ON - Abnormal;  Notable for the following:    Squamous Epithelial / LPF MANY (*)    Bacteria, UA MANY (*)    All other components within normal limits   No results found.   1. Chronic abdominal pain   2. Chronic pancreatitis       MDM  56F p/w chronic abdominal pain. Hx of pancreatitis, states vomiting, feels like she is dehydrated. Seen here several times this month for similar symptoms, no significant lab abnormalities in previous visits. Last visit received PO percocet for pain control. Has GI f/u in 3 days. AFVSS on arrival. MMM, no signs of dehydration. Lungs clear. Epigastric tenderness, which is chronic.  No concerning findings warranting imaging or lab work. Recent lab work negative. Tolerating PO after PO percocet, benadryl, and zofran ODT.  Given pain meds to last through the holiday. Instructed to f/u with GI as scheduled.       Elwin Mocha, MD 06/28/11 2350

## 2011-06-28 NOTE — ED Notes (Signed)
Patient thinks her pancreatitis is acting up again.  Unable to keep anything down. Noticed blood in her stools and emesis

## 2011-06-29 ENCOUNTER — Encounter (HOSPITAL_COMMUNITY): Payer: Self-pay | Admitting: Emergency Medicine

## 2011-06-29 ENCOUNTER — Emergency Department (HOSPITAL_COMMUNITY)
Admission: EM | Admit: 2011-06-29 | Discharge: 2011-06-30 | Disposition: A | Discharge status: Home / Self Care | Payer: Medicaid Other | Attending: Emergency Medicine | Admitting: Emergency Medicine

## 2011-06-29 DIAGNOSIS — K921 Melena: Secondary | ICD-10-CM | POA: Insufficient documentation

## 2011-06-29 DIAGNOSIS — R1084 Generalized abdominal pain: Secondary | ICD-10-CM | POA: Insufficient documentation

## 2011-06-29 DIAGNOSIS — R42 Dizziness and giddiness: Secondary | ICD-10-CM | POA: Insufficient documentation

## 2011-06-29 DIAGNOSIS — K861 Other chronic pancreatitis: Secondary | ICD-10-CM

## 2011-06-29 DIAGNOSIS — R630 Anorexia: Secondary | ICD-10-CM | POA: Insufficient documentation

## 2011-06-29 DIAGNOSIS — R197 Diarrhea, unspecified: Secondary | ICD-10-CM | POA: Insufficient documentation

## 2011-06-29 DIAGNOSIS — K920 Hematemesis: Secondary | ICD-10-CM | POA: Insufficient documentation

## 2011-06-29 LAB — CBC
HCT: 35.4 % — ABNORMAL LOW (ref 36.0–46.0)
Hemoglobin: 12 g/dL (ref 12.0–15.0)
MCH: 27.5 pg (ref 26.0–34.0)
RBC: 4.37 MIL/uL (ref 3.87–5.11)

## 2011-06-29 LAB — DIFFERENTIAL
Eosinophils Absolute: 0.3 10*3/uL (ref 0.0–0.7)
Lymphs Abs: 3 10*3/uL (ref 0.7–4.0)
Monocytes Absolute: 0.6 10*3/uL (ref 0.1–1.0)
Monocytes Relative: 7 % (ref 3–12)
Neutro Abs: 5 10*3/uL (ref 1.7–7.7)
Neutrophils Relative %: 56 % (ref 43–77)

## 2011-06-29 LAB — COMPREHENSIVE METABOLIC PANEL
Alkaline Phosphatase: 64 U/L (ref 39–117)
BUN: 18 mg/dL (ref 6–23)
Chloride: 108 mEq/L (ref 96–112)
Creatinine, Ser: 0.84 mg/dL (ref 0.50–1.10)
GFR calc Af Amer: >90 mL/min (ref >90)
Glucose, Bld: 92 mg/dL (ref 70–99)
Potassium: 3.8 mEq/L (ref 3.5–5.1)
Total Bilirubin: 0.2 mg/dL — ABNORMAL LOW (ref 0.3–1.2)

## 2011-06-29 LAB — LIPASE, BLOOD: Lipase: 62 U/L — ABNORMAL HIGH (ref 11–59)

## 2011-06-29 MED ORDER — DIPHENHYDRAMINE HCL 12.5 MG/5ML PO ELIX
25.0000 mg | ORAL_SOLUTION | Freq: Once | ORAL | Status: AC
  Administered 2011-06-29: 25 mg via ORAL
  Filled 2011-06-29: qty 10

## 2011-06-29 MED ORDER — DIPHENHYDRAMINE HCL 50 MG/ML IJ SOLN
INTRAMUSCULAR | Status: AC
  Filled 2011-06-29: qty 1

## 2011-06-29 MED ORDER — HYDROMORPHONE HCL PF 1 MG/ML IJ SOLN
0.5000 mg | Freq: Once | INTRAMUSCULAR | Status: AC
  Administered 2011-06-29: 0.5 mg via INTRAVENOUS
  Filled 2011-06-29: qty 1

## 2011-06-29 MED ORDER — OXYCODONE-ACETAMINOPHEN 5-325 MG PO TABS
2.0000 | ORAL_TABLET | Freq: Once | ORAL | Status: AC
  Administered 2011-06-29: 2 via ORAL
  Filled 2011-06-29: qty 2

## 2011-06-29 MED ORDER — ONDANSETRON 4 MG PO TBDP
4.0000 mg | ORAL_TABLET | Freq: Once | ORAL | Status: AC
  Administered 2011-06-29: 4 mg via ORAL
  Filled 2011-06-29: qty 1

## 2011-06-29 NOTE — ED Notes (Signed)
Pt states she is here with nausea vomiting and diarrhea, pt states she has a history of pancreatitis and usually needs fluids and admission to feel better, pt refused to stay last night when she was here for the same thing, pt states her laower abd is hurting, pt states she has had a fever but is afebrile here

## 2011-06-29 NOTE — ED Notes (Signed)
Pt attempted to give zofran and percocet to and pt refused to take anything by mouth, pt explained that  We need to try this first and then we could ask for something else, pt stated she wanted to see another MD so the prescribing PA informed and she went to the room and explained the samething, the pt agreed and the took the zofran and sat with the pt until it dissolved, pt then given th percocet, pt tolerated well

## 2011-06-29 NOTE — ED Notes (Signed)
Pt st's she vomited benadryl po up

## 2011-06-29 NOTE — ED Provider Notes (Signed)
Minimal epigastric tenderness without rebound to my examination with multiple prior emergency department visits for chronic abdominal pain syndrome.  I saw and evaluated the patient, reviewed the resident's note and I agree with the findings and plan.  Hurman Horn, MD 06/29/11 [phone]

## 2011-06-29 NOTE — ED Provider Notes (Signed)
Medical screening examination/treatment/procedure(s) were performed by non-physician practitioner and as supervising physician I was immediately available for consultation/collaboration.   Dayton Bailiff, MD 06/29/11 [phone]

## 2011-06-29 NOTE — ED Provider Notes (Addendum)
History     CSN: [id number]  Arrival date & time 06/29/11  2103   None     Chief Complaint  Patient presents with  . Abdominal Pain    (Consider location/radiation/quality/duration/timing/severity/associated sxs/prior treatment) HPI Comments: [name] was seen here 24 hours ago for her chronic abdominal pain evaluated given pain control and antiemetics to use at home.  She states this is not affective.  She is still vomiting and cannot tolerate the by mouth pain medication.  She reports blood in her emesis.  Also, the onset of diarrhea, more than 10 stools in less than 24 hours with blood present.  Patient is a 30 y.o. female presenting with abdominal pain. The history is provided by the patient.  Abdominal Pain The primary symptoms of the illness include abdominal pain, nausea, vomiting, diarrhea, hematemesis and hematochezia. The primary symptoms of the illness do not include fever or shortness of breath. The current episode started 3 to 5 hours ago. The onset of the illness was gradual.  Nausea began more than 1 week ago. The nausea is exacerbated by food.  The hematemesis is associated with dizziness. The hematemesis is not associated with weakness.  The illness is associated with eating. The patient states that she believes she is currently not pregnant. The patient has had a change in bowel habit. Symptoms associated with the illness do not include constipation. Significant associated medical issues do not include gallstones or liver disease.    Past Medical History  Diagnosis Date  . Pancreatitis   . S/P laparoscopic cholecystectomy   . Fibroids     Past Surgical History  Procedure Date  . Cholecystectomy   . Abdominal hysterectomy     Family History  Problem Relation Age of Onset  . Diabetes Mother   . Hypertension Mother   . Hypertension Father   . Diabetes Father     History  Substance Use Topics  . Smoking status: Current Everyday Smoker -- 0.5 packs/day for  10 years    Types: Cigarettes  . Smokeless tobacco: Not on file  . Alcohol Use: No    OB History    Grav Para Term Preterm Abortions TAB SAB Ect Mult Living                  Review of Systems  Constitutional: Negative for fever.  HENT: Negative.   Eyes: Negative.   Respiratory: Negative for shortness of breath.   Cardiovascular: Negative.   Gastrointestinal: Positive for nausea, vomiting, abdominal pain, diarrhea, blood in stool, hematochezia and hematemesis. Negative for constipation and rectal pain.  Genitourinary: Negative.   Musculoskeletal: Negative.   Skin: Negative.   Neurological: Positive for dizziness. Negative for weakness.  Hematological: Negative.   Psychiatric/Behavioral: Negative.     Allergies  Celecoxib; Ketorolac tromethamine; Meperidine hcl; Codeine; Morphine; Propoxyphene n-acetaminophen; and Tramadol hcl  Home Medications   Current Outpatient Rx  Name Route Sig Dispense Refill  . ESTRADIOL 2 MG PO TABS Oral Take 2 mg by mouth at bedtime.      Marland Kitchen PROMETHAZINE HCL 25 MG RE SUPP Rectal Place 1 suppository (25 mg total) rectally every 6 (six) hours as needed for nausea. 12 each 0    BP 118/92  Pulse 80  Temp(Src) 98.4 F (36.9 C) (Oral)  Resp 16  SpO2 100%  LMP 06/01/2011  Physical Exam  Constitutional: She is oriented to person, place, and time. She appears well-developed and well-nourished.  Eyes: Pupils are equal, round, and reactive to  light.  Neck: Normal range of motion.  Cardiovascular: Normal rate.   Pulmonary/Chest: Effort normal.  Abdominal: She exhibits no distension. There is generalized tenderness.  Neurological: She is alert and oriented to person, place, and time.  Skin: Skin is warm and dry.    ED Course  Procedures (including critical care time)  Labs Reviewed  CBC - Abnormal; Notable for the following:    HCT 35.4 (*)    All other components within normal limits  COMPREHENSIVE METABOLIC PANEL - Abnormal; Notable for the  following:    Total Bilirubin 0.2 (*)    All other components within normal limits  LIPASE, BLOOD - Abnormal; Notable for the following:    Lipase 62 (*)    All other components within normal limits  DIFFERENTIAL  POCT OCCULT BLOOD STOOL, DEVICE   No results found.   1. Pancreatitis chronic     Patient was observed until with Zofran dissolved.  She tolerated this well.  She then took a Percocet, which appeared to be tolerated, but did walk to the bathroom 15 minutes later and reports, that she vomited a Percocet in the toilet, which nobody observed and she is requesting additional pain meds Nausea has improved, but still having pain.  Will administer a third milligram of Dilaudid Patient is sleeping.  Will discharge MDM  Will check labs, CBC CMET, lipase, provide pain control, antiemetic        Arman Filter, NP 06/29/11 2227  Arman Filter, NP 06/30/11 0221  Arman Filter, NP 06/30/11 4540  Arman Filter, NP 06/30/11 [phone]

## 2011-06-29 NOTE — ED Notes (Signed)
Pt c/o abd pain getting worse,  PA made aware

## 2011-06-30 ENCOUNTER — Encounter (HOSPITAL_COMMUNITY): Payer: Self-pay | Admitting: *Deleted

## 2011-06-30 ENCOUNTER — Emergency Department (HOSPITAL_COMMUNITY)
Admission: EM | Admit: 2011-06-30 | Discharge: 2011-06-30 | Disposition: A | Discharge status: Home / Self Care | Payer: Medicaid Other | Source: Home / Self Care | Attending: Emergency Medicine | Admitting: Emergency Medicine

## 2011-06-30 DIAGNOSIS — R112 Nausea with vomiting, unspecified: Secondary | ICD-10-CM | POA: Insufficient documentation

## 2011-06-30 DIAGNOSIS — R109 Unspecified abdominal pain: Secondary | ICD-10-CM | POA: Insufficient documentation

## 2011-06-30 DIAGNOSIS — G8929 Other chronic pain: Secondary | ICD-10-CM | POA: Insufficient documentation

## 2011-06-30 DIAGNOSIS — K921 Melena: Secondary | ICD-10-CM | POA: Insufficient documentation

## 2011-06-30 LAB — OCCULT BLOOD, POC DEVICE: Fecal Occult Bld: NEGATIVE

## 2011-06-30 MED ORDER — PROMETHAZINE HCL 25 MG RE SUPP: 25.0000 mg | Freq: Four times a day (QID) | RECTAL | Status: DC | PRN

## 2011-06-30 MED ORDER — HYDROMORPHONE HCL PF 1 MG/ML IJ SOLN
1.0000 mg | Freq: Once | INTRAMUSCULAR | Status: AC
  Administered 2011-06-30: 1 mg via INTRAVENOUS
  Filled 2011-06-30: qty 1

## 2011-06-30 MED ORDER — PROMETHAZINE HCL 25 MG RE SUPP
25.0000 mg | RECTAL | Status: DC
  Filled 2011-06-30: qty 1

## 2011-06-30 MED ORDER — PROMETHAZINE HCL 25 MG/ML IJ SOLN
25.0000 mg | INTRAMUSCULAR | Status: AC
  Administered 2011-06-30: 25 mg via INTRAMUSCULAR
  Filled 2011-06-30: qty 1

## 2011-06-30 MED ORDER — HYDROMORPHONE HCL PF 2 MG/ML IJ SOLN: 2.0000 mg | Freq: Once | INTRAMUSCULAR | Status: DC

## 2011-06-30 MED ORDER — DIPHENHYDRAMINE HCL 50 MG/ML IJ SOLN
12.5000 mg | Freq: Once | INTRAMUSCULAR | Status: AC
  Administered 2011-06-30: 12.5 mg via INTRAMUSCULAR
  Filled 2011-06-30: qty 1

## 2011-06-30 MED ORDER — PROMETHAZINE HCL 25 MG/ML IJ SOLN
25.0000 mg | INTRAMUSCULAR | Status: DC
  Filled 2011-06-30: qty 1

## 2011-06-30 MED ORDER — HYDROMORPHONE HCL PF 2 MG/ML IJ SOLN
2.0000 mg | Freq: Once | INTRAMUSCULAR | Status: AC
  Administered 2011-06-30: 2 mg via INTRAMUSCULAR
  Filled 2011-06-30: qty 1

## 2011-06-30 NOTE — ED Notes (Signed)
Pt state she wants her benadryl pushed fast and she can't take anymore via her mouth, pt states it doesn't work, ptsaid she wants it fast so it does not burn, MD made aware

## 2011-06-30 NOTE — ED Notes (Signed)
In to give pt discharge instr. And pt stated she would come right back, that she is still hurting and itching, pt requests to see the charge nurse, MD made aware that she is requesting Dilaudid 2mg  and Benadryl iv form, pt informed it would be a little wait, she said if she is discharged home she would come right back, i told her she was in her right to come get medical attention but at this point there was nothing to admit her for

## 2011-06-30 NOTE — ED Notes (Signed)
Pt seen earlier tonight for same. Pt states for 3 years she has suffered with "pancreatitis flair ups". This flair up has been for 4 weeks. Pt states she also has nausea and diarrhea. She states the earlier staff would only give her 1mg  of dilaudid, that it takes 2 mg of dilaudid to give her relief.

## 2011-06-30 NOTE — ED Provider Notes (Signed)
History     CSN: [id number]  Arrival date & time 06/30/11  4098   First MD Initiated Contact with Patient 06/30/11 [phone]      Chief Complaint  Patient presents with  . Abdominal Pain  . Nausea    (Consider location/radiation/quality/duration/timing/severity/associated sxs/prior treatment) HPI Comments: Patient with history of chronic abdominal pain due to "chronic pancreatitis".  Has been multiple times in the past and given high doses of pain meds.  She was here last night, given pain meds and discharged after the provider would not give more pain medications.  She then waited several hours in the waiting room until that provider left, then signed back in complaining of her pain having returned.  No fevers.  She reports to me she had "blood in her stool" that was bright red.      Patient is a 30 y.o. female presenting with abdominal pain. The history is provided by the patient.  Abdominal Pain The primary symptoms of the illness include abdominal pain, nausea, vomiting and hematochezia. The primary symptoms of the illness do not include fever, fatigue, diarrhea or dysuria.    Past Medical History  Diagnosis Date  . Pancreatitis   . S/P laparoscopic cholecystectomy   . Fibroids     Past Surgical History  Procedure Date  . Cholecystectomy   . Abdominal hysterectomy     Family History  Problem Relation Age of Onset  . Diabetes Mother   . Hypertension Mother   . Hypertension Father   . Diabetes Father     History  Substance Use Topics  . Smoking status: Current Everyday Smoker -- 0.5 packs/day for 10 years    Types: Cigarettes  . Smokeless tobacco: Not on file  . Alcohol Use: No    OB History    Grav Para Term Preterm Abortions TAB SAB Ect Mult Living                  Review of Systems  Constitutional: Negative for fever and fatigue.  Gastrointestinal: Positive for nausea, vomiting, abdominal pain and hematochezia. Negative for diarrhea.  Genitourinary:  Negative for dysuria.  All other systems reviewed and are negative.    Allergies  Celecoxib; Ketorolac tromethamine; Meperidine hcl; Codeine; Morphine; Propoxyphene n-acetaminophen; and Tramadol hcl  Home Medications   Current Outpatient Rx  Name Route Sig Dispense Refill  . ESTRADIOL 2 MG PO TABS Oral Take 2 mg by mouth at bedtime.      Marland Kitchen PROMETHAZINE HCL 25 MG RE SUPP Rectal Place 1 suppository (25 mg total) rectally every 6 (six) hours as needed for nausea. 12 each 0    BP 101/68  Pulse 64  Temp(Src) 97.4 F (36.3 C) (Oral)  Resp 16  SpO2 100%  LMP 06/01/2011  Physical Exam  Nursing note and vitals reviewed. Constitutional: She is oriented to person, place, and time. She appears well-developed and well-nourished.  HENT:  Head: Normocephalic and atraumatic.  Mouth/Throat: Oropharynx is clear and moist.  Neck: Normal range of motion. Neck supple.  Cardiovascular: Normal rate and regular rhythm.   No murmur heard. Pulmonary/Chest: Effort normal. No respiratory distress.  Abdominal: Soft. Bowel sounds are normal. She exhibits no distension. There is no tenderness.  Musculoskeletal: Normal range of motion. She exhibits no edema.  Neurological: She is alert and oriented to person, place, and time.  Skin: Skin is warm and dry. She is not diaphoretic.    ED Course  Procedures (including critical care time)   Labs  Reviewed  OCCULT BLOOD X 1 CARD TO LAB, STOOL   No results found.   No diagnosis found.    MDM  This patient's care is very difficult to manage.  She has been told she has chronic pancreatitis, and comes here repeatedly for "flare ups".   She is allergic to every pain medication, except for dilaudid.  And when she is here, she tells the nurse how to push her medications.  If she doesn't get the medications she wants, she always threatens to just "sign back in".  The last time I saw her, she refused to leave, stating that she was in "too much pain", although  she looked in no distress.  I ended up having her admitted at that time, but she seems to have been non-compliant with her follow up.  She tells me she has a GI doctor in [place], whose name she cannot remember.    This morning, she appears to me to be in minimal, if any discomfort and is not vomiting.  She is moving around the room, sitting up, and is not pale, tachycardic, or diaphoretic.  She now is saying she has blood in her stool.  The rectal exam was normal, and the stool was guaiac negative.  She will be discharged to home after one additional dose of dilaudid and phenergan that will given in additional to what she received from the night shift provider.  She has been advised that she needs to deal with this chronic issue with her GI doctor in [place].  I have also advised her that we will not be prescribing pain medications from this ER for this problem anymore.  She understands this.          Geoffery Lyons, MD 06/30/11 [phone]

## 2011-06-30 NOTE — ED Provider Notes (Signed)
Medical screening examination/treatment/procedure(s) were performed by non-physician practitioner and as supervising physician I was immediately available for consultation/collaboration.   Laray Anger, DO 06/30/11 [phone]

## 2011-06-30 NOTE — ED Notes (Signed)
Report received, assumed care.  

## 2011-06-30 NOTE — ED Notes (Signed)
Pt requesting Benadryl IV to give with Dilaudid

## 2011-07-09 ENCOUNTER — Emergency Department (HOSPITAL_COMMUNITY)
Admission: EM | Admit: 2011-07-09 | Discharge: 2011-07-09 | Disposition: A | Discharge status: Home / Self Care | Payer: Medicaid Other | Attending: Internal Medicine | Admitting: Internal Medicine

## 2011-07-09 ENCOUNTER — Encounter (HOSPITAL_COMMUNITY): Payer: Self-pay | Admitting: *Deleted

## 2011-07-09 DIAGNOSIS — R1110 Vomiting, unspecified: Secondary | ICD-10-CM | POA: Insufficient documentation

## 2011-07-09 DIAGNOSIS — R630 Anorexia: Secondary | ICD-10-CM | POA: Insufficient documentation

## 2011-07-09 DIAGNOSIS — Z79899 Other long term (current) drug therapy: Secondary | ICD-10-CM | POA: Insufficient documentation

## 2011-07-09 DIAGNOSIS — R1013 Epigastric pain: Secondary | ICD-10-CM | POA: Insufficient documentation

## 2011-07-09 LAB — CBC
Platelets: 280 10*3/uL (ref 150–400)
RBC: 4.89 MIL/uL (ref 3.87–5.11)
WBC: 7.8 10*3/uL (ref 4.0–10.5)

## 2011-07-09 LAB — URINALYSIS, ROUTINE W REFLEX MICROSCOPIC
Hgb urine dipstick: NEGATIVE
Leukocytes, UA: NEGATIVE
Nitrite: NEGATIVE
Specific Gravity, Urine: 1.009 (ref 1.005–1.030)
Urobilinogen, UA: 0.2 mg/dL (ref 0.0–1.0)

## 2011-07-09 LAB — AMYLASE: Amylase: 107 U/L — ABNORMAL HIGH (ref 0–105)

## 2011-07-09 LAB — BASIC METABOLIC PANEL
CO2: 26 mEq/L (ref 19–32)
Chloride: 102 mEq/L (ref 96–112)
Sodium: 136 mEq/L (ref 135–145)

## 2011-07-09 LAB — LIPASE, BLOOD: Lipase: 48 U/L (ref 11–59)

## 2011-07-09 MED ORDER — PROMETHAZINE HCL 25 MG PO TABS: 25.0000 mg | ORAL_TABLET | Freq: Four times a day (QID) | ORAL | Status: DC | PRN

## 2011-07-09 MED ORDER — DIPHENHYDRAMINE HCL 50 MG/ML IJ SOLN
25.0000 mg | Freq: Once | INTRAMUSCULAR | Status: AC
  Administered 2011-07-09: 25 mg via INTRAVENOUS
  Filled 2011-07-09: qty 1

## 2011-07-09 MED ORDER — HYDROMORPHONE HCL PF 1 MG/ML IJ SOLN
1.0000 mg | Freq: Once | INTRAMUSCULAR | Status: AC
  Administered 2011-07-09: 1 mg via INTRAVENOUS
  Filled 2011-07-09: qty 1

## 2011-07-09 MED ORDER — SODIUM CHLORIDE 0.9 % IV BOLUS (SEPSIS)
500.0000 mL | Freq: Once | INTRAVENOUS | Status: AC
  Administered 2011-07-09: 1000 mL via INTRAVENOUS

## 2011-07-09 MED ORDER — DIPHENHYDRAMINE HCL 50 MG/ML IJ SOLN
INTRAMUSCULAR | Status: AC
  Filled 2011-07-09: qty 1

## 2011-07-09 MED ORDER — SODIUM CHLORIDE 0.9 % IV SOLN: INTRAVENOUS | Status: DC

## 2011-07-09 MED ORDER — DIPHENHYDRAMINE HCL 50 MG/ML IJ SOLN
25.0000 mg | Freq: Once | INTRAMUSCULAR | Status: AC
  Administered 2011-07-09: 25 mg via INTRAVENOUS

## 2011-07-09 MED ORDER — ONDANSETRON HCL 4 MG/2ML IJ SOLN
4.0000 mg | Freq: Once | INTRAMUSCULAR | Status: AC
  Administered 2011-07-09: 4 mg via INTRAVENOUS
  Filled 2011-07-09: qty 2

## 2011-07-09 MED ORDER — OXYCODONE-ACETAMINOPHEN 5-325 MG PO TABS: 2.0000 | ORAL_TABLET | ORAL | Status: AC | PRN

## 2011-07-09 NOTE — ED Provider Notes (Addendum)
History     CSN: [id number]  Arrival date & time 07/09/11  1249   First MD Initiated Contact with Patient 07/09/11 1601      Chief Complaint  Patient presents with  . Abdominal Pain    pt states "I have pancreatitis" states pain started 6 days ago. reports n/v/d. pt reports unable to keep solids or liquid down.     (Consider location/radiation/quality/duration/timing/severity/associated sxs/prior treatment) HPI..... epigastric pain for 6 days. Multiple emergency department visits. Patient blames pain and pancreatitis for the past 4 years. This was thought secondary to complications from gallbladder disease. Decreased appetite. Some vomiting. Nothing makes pain better or worse. Does not drink alcohol.  Pain is sharp and nonradiating  Past Medical History  Diagnosis Date  . Pancreatitis   . S/P laparoscopic cholecystectomy   . Fibroids     Past Surgical History  Procedure Date  . Cholecystectomy   . Abdominal hysterectomy     Family History  Problem Relation Age of Onset  . Diabetes Mother   . Hypertension Mother   . Hypertension Father   . Diabetes Father     History  Substance Use Topics  . Smoking status: Current Everyday Smoker -- 0.5 packs/day for 10 years    Types: Cigarettes  . Smokeless tobacco: Not on file  . Alcohol Use: No    OB History    Grav Para Term Preterm Abortions TAB SAB Ect Mult Living                  Review of Systems  All other systems reviewed and are negative.    Allergies  Celecoxib; Ketorolac tromethamine; Meperidine hcl; Codeine; Ibuprofen; Morphine; Propoxyphene n-acetaminophen; and Tramadol hcl  Home Medications   Current Outpatient Rx  Name Route Sig Dispense Refill  . ESTRADIOL 2 MG PO TABS Oral Take 2 mg by mouth at bedtime.        BP 136/69  Pulse 74  Temp(Src) 98.2 F (36.8 C) (Oral)  Resp 16  SpO2 100%  LMP 06/01/2011  Physical Exam  Nursing note and vitals reviewed. Constitutional: She is oriented to  person, place, and time. She appears well-developed and well-nourished.  HENT:  Head: Normocephalic and atraumatic.  Eyes: Conjunctivae and EOM are normal. Pupils are equal, round, and reactive to light.  Neck: Normal range of motion. Neck supple.  Cardiovascular: Normal rate and regular rhythm.   Pulmonary/Chest: Effort normal and breath sounds normal.  Abdominal: Soft. Bowel sounds are normal.       Slight epigastric tenderness  Musculoskeletal: Normal range of motion.  Neurological: She is alert and oriented to person, place, and time.  Skin: Skin is warm and dry.  Psychiatric: She has a normal mood and affect.    ED Course  Procedures (including critical care time)  Labs Reviewed  URINALYSIS, ROUTINE W REFLEX MICROSCOPIC - Abnormal; Notable for the following:    APPearance CLOUDY (*)    All other components within normal limits  AMYLASE - Abnormal; Notable for the following:    Amylase 107 (*)    All other components within normal limits  CBC  BASIC METABOLIC PANEL  LIPASE, BLOOD   No results found.   No diagnosis found.    MDM  Patient does not appear toxic.  Seen her in the past. She looks approximately the same. Repeat labs, treat pain, probable discharge   Recheck at 10:30.  No acute abdomen   ...  Donnetta Hutching, MD 07/09/11 2019  Donnetta Hutching, MD 07/09/11 2230  Donnetta Hutching, MD 09/13/11 1610  Donnetta Hutching, MD 09/24/11 2303

## 2011-07-09 NOTE — ED Notes (Signed)
C/o abdominal pain, nausea with vomiting and diarrhea for the past few days--Reports similar symptoms with prior episodes of pancreatitis.  Rates the pain 9 on 1-10 scale.

## 2011-07-21 ENCOUNTER — Emergency Department (HOSPITAL_COMMUNITY)
Admission: EM | Admit: 2011-07-21 | Discharge: 2011-07-21 | Disposition: A | Discharge status: Home / Self Care | Payer: Medicaid Other | Attending: Emergency Medicine | Admitting: Emergency Medicine

## 2011-07-21 ENCOUNTER — Encounter (HOSPITAL_COMMUNITY): Payer: Self-pay

## 2011-07-21 DIAGNOSIS — K861 Other chronic pancreatitis: Secondary | ICD-10-CM | POA: Insufficient documentation

## 2011-07-21 DIAGNOSIS — R1110 Vomiting, unspecified: Secondary | ICD-10-CM | POA: Insufficient documentation

## 2011-07-21 DIAGNOSIS — R109 Unspecified abdominal pain: Secondary | ICD-10-CM | POA: Insufficient documentation

## 2011-07-21 LAB — CBC
Hemoglobin: 13.5 g/dL (ref 12.0–15.0)
MCHC: 34.6 g/dL (ref 30.0–36.0)
Platelets: 251 10*3/uL (ref 150–400)
RBC: 4.86 MIL/uL (ref 3.87–5.11)

## 2011-07-21 LAB — COMPREHENSIVE METABOLIC PANEL
ALT: 15 U/L (ref 0–35)
Albumin: 4.1 g/dL (ref 3.5–5.2)
Alkaline Phosphatase: 74 U/L (ref 39–117)
BUN: 12 mg/dL (ref 6–23)
Chloride: 105 mEq/L (ref 96–112)
Potassium: 4 mEq/L (ref 3.5–5.1)
Sodium: 138 mEq/L (ref 135–145)
Total Bilirubin: 0.3 mg/dL (ref 0.3–1.2)

## 2011-07-21 LAB — DIFFERENTIAL
Basophils Relative: 0 % (ref 0–1)
Lymphs Abs: 2.6 10*3/uL (ref 0.7–4.0)
Monocytes Relative: 8 % (ref 3–12)
Neutro Abs: 5.3 10*3/uL (ref 1.7–7.7)
Neutrophils Relative %: 60 % (ref 43–77)

## 2011-07-21 LAB — LIPASE, BLOOD: Lipase: 44 U/L (ref 11–59)

## 2011-07-21 MED ORDER — DIPHENHYDRAMINE HCL 50 MG/ML IJ SOLN
25.0000 mg | Freq: Once | INTRAMUSCULAR | Status: AC
  Administered 2011-07-21: 25 mg via INTRAVENOUS
  Filled 2011-07-21: qty 1

## 2011-07-21 MED ORDER — DIPHENHYDRAMINE HCL 50 MG/ML IJ SOLN
25.0000 mg | Freq: Once | INTRAMUSCULAR | Status: AC
  Administered 2011-07-21: 25 mg via INTRAVENOUS

## 2011-07-21 MED ORDER — ONDANSETRON HCL 4 MG/2ML IJ SOLN
4.0000 mg | Freq: Once | INTRAMUSCULAR | Status: AC
  Administered 2011-07-21: 4 mg via INTRAVENOUS
  Filled 2011-07-21: qty 2

## 2011-07-21 MED ORDER — DIPHENHYDRAMINE HCL 50 MG/ML IJ SOLN
INTRAMUSCULAR | Status: AC
  Administered 2011-07-21: 25 mg via INTRAVENOUS
  Filled 2011-07-21: qty 1

## 2011-07-21 MED ORDER — SODIUM CHLORIDE 0.9 % IV SOLN: Freq: Once | INTRAVENOUS | Status: DC

## 2011-07-21 MED ORDER — SODIUM CHLORIDE 0.9 % IV BOLUS (SEPSIS)
1000.0000 mL | Freq: Once | INTRAVENOUS | Status: AC
  Administered 2011-07-21: 1000 mL via INTRAVENOUS

## 2011-07-21 MED ORDER — PROMETHAZINE HCL 25 MG PO TABS: 25.0000 mg | ORAL_TABLET | Freq: Four times a day (QID) | ORAL | Status: DC | PRN

## 2011-07-21 NOTE — ED Provider Notes (Signed)
History     CSN: [id number]  Arrival date & time 07/21/11  1428   First MD Initiated Contact with Patient 07/21/11 1527      Chief Complaint  Patient presents with  . Abdominal Pain    (Consider location/radiation/quality/duration/timing/severity/associated sxs/prior treatment) Patient is a 31 y.o. female presenting with abdominal pain. The history is provided by the patient.  Abdominal Pain The primary symptoms of the illness include abdominal pain.   patient here with abdominal pain described as similar to her prior episodes of pancreatitis. Patient has a long history of similar symptoms. Has had multiple ER visits for same. She is in no acute distress at this time. States that she has had vomiting multiple times. No hematemesis questionable black stools. No fever. She does see a gastroenterologist in [place] who is managing her pancreatitis however she has not seen her for the past month. Patient was evaluated on January the fourth of this year for similar symptoms and had a mildly elevated amylase or lipase was normal. She denies any vaginal bleeding or discharge. No urinary symptoms at this time  Past Medical History  Diagnosis Date  . Pancreatitis   . S/P laparoscopic cholecystectomy   . Fibroids     Past Surgical History  Procedure Date  . Cholecystectomy   . Abdominal hysterectomy     Family History  Problem Relation Age of Onset  . Diabetes Mother   . Hypertension Mother   . Hypertension Father   . Diabetes Father     History  Substance Use Topics  . Smoking status: Current Everyday Smoker -- 0.5 packs/day for 10 years    Types: Cigarettes  . Smokeless tobacco: Not on file  . Alcohol Use: No    OB History    Grav Para Term Preterm Abortions TAB SAB Ect Mult Living                  Review of Systems  Gastrointestinal: Positive for abdominal pain.  All other systems reviewed and are negative.    Allergies  Celecoxib; Ketorolac tromethamine;  Meperidine hcl; Codeine; Ibuprofen; Morphine; Propoxyphene n-acetaminophen; and Tramadol hcl  Home Medications   Current Outpatient Rx  Name Route Sig Dispense Refill  . ESTRADIOL 2 MG PO TABS Oral Take 2 mg by mouth at bedtime.        BP 123/83  Pulse 91  Temp(Src) 99.5 F (37.5 C) (Oral)  Resp 20  SpO2 100%  LMP 06/01/2011  Physical Exam  Nursing note and vitals reviewed. Constitutional: She is oriented to person, place, and time. Vital signs are normal. She appears well-developed and well-nourished.  Non-toxic appearance. No distress.  HENT:  Head: Normocephalic and atraumatic.  Eyes: Conjunctivae, EOM and lids are normal. Pupils are equal, round, and reactive to light.  Neck: Normal range of motion. Neck supple. No tracheal deviation present. No mass present.  Cardiovascular: Normal rate, regular rhythm and normal heart sounds.  Exam reveals no gallop.   No murmur heard. Pulmonary/Chest: Effort normal and breath sounds normal. No stridor. No respiratory distress. She has no decreased breath sounds. She has no wheezes. She has no rhonchi. She has no rales.  Abdominal: Soft. Normal appearance and bowel sounds are normal. She exhibits no distension. There is tenderness in the epigastric area. There is no rigidity, no rebound, no guarding and no CVA tenderness.  Musculoskeletal: Normal range of motion. She exhibits no edema and no tenderness.  Neurological: She is alert and oriented to person,  place, and time. She has normal strength. No cranial nerve deficit or sensory deficit. GCS eye subscore is 4. GCS verbal subscore is 5. GCS motor subscore is 6.  Skin: Skin is warm and dry. No abrasion and no rash noted.  Psychiatric: She has a normal mood and affect. Her speech is normal and behavior is normal.    ED Course  Procedures (including critical care time)   Labs Reviewed  CBC  DIFFERENTIAL  COMPREHENSIVE METABOLIC PANEL  LIPASE, BLOOD  POCT PREGNANCY, URINE   No results  found.   No diagnosis found.    MDM  Patient given IV fluids here and Zofran. Patient appears in no acute distress at this time. We'll not give her narcotics at this time. Was instructed to followup with her gastroenterologist as needed. We'll give her medications for nausea here. No clinical signs of dehydration. No signs of acute abdominal process        Toy Baker, MD 07/21/11 [phone]

## 2011-07-21 NOTE — ED Notes (Signed)
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 Patient stated she could not urinate at this time.

## 2011-07-21 NOTE — ED Notes (Signed)
Pt. Has a diagnosis of pancreatitis, and her pain began the end of December, having exacerbation of n/v/ and pain.

## 2011-07-24 ENCOUNTER — Encounter (HOSPITAL_COMMUNITY): Payer: Self-pay | Admitting: *Deleted

## 2011-07-24 ENCOUNTER — Emergency Department (HOSPITAL_COMMUNITY)
Admission: EM | Admit: 2011-07-24 | Discharge: 2011-07-25 | Disposition: A | Discharge status: Home / Self Care | Payer: Medicaid Other | Attending: Emergency Medicine | Admitting: Emergency Medicine

## 2011-07-24 DIAGNOSIS — R112 Nausea with vomiting, unspecified: Secondary | ICD-10-CM | POA: Insufficient documentation

## 2011-07-24 DIAGNOSIS — R10816 Epigastric abdominal tenderness: Secondary | ICD-10-CM | POA: Insufficient documentation

## 2011-07-24 DIAGNOSIS — F17200 Nicotine dependence, unspecified, uncomplicated: Secondary | ICD-10-CM | POA: Insufficient documentation

## 2011-07-24 DIAGNOSIS — R197 Diarrhea, unspecified: Secondary | ICD-10-CM | POA: Insufficient documentation

## 2011-07-24 LAB — DIFFERENTIAL
Basophils Absolute: 0.1 10*3/uL (ref 0.0–0.1)
Basophils Relative: 1 % (ref 0–1)
Lymphocytes Relative: 29 % (ref 12–46)
Lymphs Abs: 3.2 10*3/uL (ref 0.7–4.0)

## 2011-07-24 LAB — CBC
HCT: 38 % (ref 36.0–46.0)
Hemoglobin: 12.9 g/dL (ref 12.0–15.0)
MCH: 27.3 pg (ref 26.0–34.0)
MCV: 80.3 fL (ref 78.0–100.0)
Platelets: 302 10*3/uL (ref 150–400)
RBC: 4.73 MIL/uL (ref 3.87–5.11)
WBC: 10.9 10*3/uL — ABNORMAL HIGH (ref 4.0–10.5)

## 2011-07-24 LAB — POCT I-STAT, CHEM 8
Creatinine, Ser: 0.8 mg/dL (ref 0.50–1.10)
Hemoglobin: 13.3 g/dL (ref 12.0–15.0)
Sodium: 144 mEq/L (ref 135–145)
TCO2: 26 mmol/L (ref 0–100)

## 2011-07-24 LAB — OCCULT BLOOD, POC DEVICE: Fecal Occult Bld: NEGATIVE

## 2011-07-24 MED ORDER — DIPHENHYDRAMINE HCL 50 MG/ML IJ SOLN
12.5000 mg | Freq: Once | INTRAMUSCULAR | Status: AC
  Administered 2011-07-24: 12.5 mg via INTRAVENOUS
  Filled 2011-07-24: qty 1

## 2011-07-24 MED ORDER — ONDANSETRON HCL 4 MG/2ML IJ SOLN
4.0000 mg | Freq: Once | INTRAMUSCULAR | Status: AC
Start: 2011-07-24 — End: 2011-07-24
  Administered 2011-07-24: 4 mg via INTRAVENOUS
  Filled 2011-07-24: qty 2

## 2011-07-24 MED ORDER — PANTOPRAZOLE SODIUM 40 MG IV SOLR
40.0000 mg | Freq: Once | INTRAVENOUS | Status: DC
  Filled 2011-07-24: qty 40

## 2011-07-24 MED ORDER — SODIUM CHLORIDE 0.9 % IV BOLUS (SEPSIS)
1000.0000 mL | Freq: Once | INTRAVENOUS | Status: AC
  Administered 2011-07-24: 1000 mL via INTRAVENOUS

## 2011-07-24 NOTE — ED Provider Notes (Signed)
History     CSN: [id number]  Arrival date & time 07/24/11  2111   First MD Initiated Contact with Patient 07/24/11 2123      Chief Complaint  Patient presents with  . Abdominal Pain    hx of pancreatitis.  Was @ [facility] x 6 days ago and refused admission for same    (Consider location/radiation/quality/duration/timing/severity/associated sxs/prior treatment) HPI  31 year old female with history of pancreatitis and multiple visits to ER for same. Patient presents complaining of epigastric abdominal pain with associated nausea, vomiting and diarrhea. Patient states symptom has been ongoing for the past 6 days, and is getting worse. She has been vomiting up bright red blood that has noticed black stool. Patient states she feels very dehydrated.  She states her pain for a very similar to the history of pancreatitis. She denies any recent alcohol use. She has had a cholecystectomy done in the past. This patient has been seen in the ED multiple times for the exam.  Pt denies fever, cp, sob, back pain, dysurea, urinary frequency or urgency, or rash.  Past Medical History  Diagnosis Date  . Pancreatitis   . S/P laparoscopic cholecystectomy   . Fibroids     Past Surgical History  Procedure Date  . Cholecystectomy   . Abdominal hysterectomy     Family History  Problem Relation Age of Onset  . Diabetes Mother   . Hypertension Mother   . Hypertension Father   . Diabetes Father     History  Substance Use Topics  . Smoking status: Current Everyday Smoker -- 0.5 packs/day for 10 years    Types: Cigarettes  . Smokeless tobacco: Not on file  . Alcohol Use: No    OB History    Grav Para Term Preterm Abortions TAB SAB Ect Mult Living                  Review of Systems  All other systems reviewed and are negative.    Allergies  Celecoxib; Ketorolac tromethamine; Meperidine hcl; Codeine; Ibuprofen; Morphine; Propoxyphene n-acetaminophen; and Tramadol hcl  Home Medications    Current Outpatient Rx  Name Route Sig Dispense Refill  . ESTRADIOL 2 MG PO TABS Oral Take 2 mg by mouth at bedtime.      Marland Kitchen PROMETHAZINE HCL 25 MG PO TABS Oral Take 1 tablet (25 mg total) by mouth every 6 (six) hours as needed for nausea. 30 tablet 0    BP 122/59  Pulse 94  Temp(Src) 98.8 F (37.1 C) (Oral)  Resp 18  SpO2 100%  LMP 06/01/2011  Physical Exam  Nursing note and vitals reviewed. Constitutional:       Awake, alert, nontoxic appearance  HENT:  Head: Atraumatic.  Eyes: Right eye exhibits no discharge. Left eye exhibits no discharge.  Neck: Neck supple.  Pulmonary/Chest: Effort normal. She exhibits no tenderness.  Abdominal: Normal appearance. There is tenderness in the epigastric area. There is no rebound.  Musculoskeletal: She exhibits no tenderness.       Baseline ROM, no obvious new focal weakness  Neurological:       Mental status and motor strength appears baseline for patient and situation  Skin: No rash noted.  Psychiatric: She has a normal mood and affect.    ED Course  Procedures (including critical care time)  Labs Reviewed - No data to display No results found.   No diagnosis found.    MDM  This patient has for repeated visit for the same  complaint. Last seen 3 days ago for same. Patient appears to be in no acute distress. No evidence suggestive of a hydration. She endorses epigastric abdominal pain but her abdomen is benign on exam. No narcotic pain medication given today. I will check basic labs, Zofran for nausea, IV fluid. Anticipate to discharge patient after labs complete.  11:17 PM Today's labs shows no concerning finding. No evidence of dehydration. Mildly elevated lipase at 72. Mildly elevated white count of 10.9 with no left shift. Normal renal function. Hemoccult negative.  In light of normal lab work patient will be discharged. I recommend for patient to follow up with her GI doctor for further management.     Fayrene Helper,  PA-C 07/24/11 2325  Fayrene Helper, PA-C 07/24/11 2330

## 2011-07-24 NOTE — ED Notes (Signed)
Ask pt twice for urine pt states unable to void

## 2011-07-24 NOTE — ED Provider Notes (Signed)
Medical screening examination/treatment/procedure(s) were performed by non-physician practitioner and as supervising physician I was immediately available for consultation/collaboration.  Able Malloy, MD 07/24/11 2332 

## 2011-07-24 NOTE — ED Notes (Signed)
MD at bedside. 

## 2011-07-24 NOTE — ED Notes (Signed)
Pt has had upper abdominal pain x6 days that has been ongoing but intermittently worsening.  Pt presents tonight for fever and same abdominal pain.

## 2011-07-25 NOTE — ED Notes (Signed)
Pt requested for meal to be given.  Pt given sandwich and drink.

## 2011-08-04 ENCOUNTER — Ambulatory Visit (INDEPENDENT_AMBULATORY_CARE_PROVIDER_SITE_OTHER): Payer: Medicaid Other | Admitting: Advanced Practice Midwife

## 2011-08-04 ENCOUNTER — Other Ambulatory Visit (HOSPITAL_COMMUNITY)
Admission: RE | Admit: 2011-08-04 | Discharge: 2011-08-04 | Disposition: A | Discharge status: Home / Self Care | Payer: Medicaid Other | Source: Ambulatory Visit | Attending: Advanced Practice Midwife | Admitting: Advanced Practice Midwife

## 2011-08-04 ENCOUNTER — Encounter: Payer: Self-pay | Admitting: Advanced Practice Midwife

## 2011-08-04 DIAGNOSIS — N951 Menopausal and female climacteric states: Secondary | ICD-10-CM

## 2011-08-04 DIAGNOSIS — L03019 Cellulitis of unspecified finger: Secondary | ICD-10-CM

## 2011-08-04 DIAGNOSIS — Z01419 Encounter for gynecological examination (general) (routine) without abnormal findings: Secondary | ICD-10-CM

## 2011-08-04 DIAGNOSIS — R102 Pelvic and perineal pain: Secondary | ICD-10-CM

## 2011-08-04 DIAGNOSIS — N949 Unspecified condition associated with female genital organs and menstrual cycle: Secondary | ICD-10-CM

## 2011-08-04 MED ORDER — ESTRADIOL 2 MG PO TABS: 2.0000 mg | ORAL_TABLET | Freq: Every day | ORAL | Status: DC

## 2011-08-04 MED ORDER — CEPHALEXIN 500 MG PO CAPS: 500.0000 mg | ORAL_CAPSULE | Freq: Four times a day (QID) | ORAL | Status: AC

## 2011-08-04 NOTE — Progress Notes (Signed)
Addended by: Lynnell Dike on: 08/04/2011 04:27 PM   Modules accepted: Orders

## 2011-08-04 NOTE — Progress Notes (Signed)
States pain sometimes wakes her up at night, unrelieved by tylenol

## 2011-08-04 NOTE — Patient Instructions (Signed)
Menopause Menopause is the normal time of life when menstrual periods stop completely. Menopause is complete when you have missed 12 consecutive menstrual periods. It usually occurs between the ages of 48 to 55, with an average age of 51. Very rarely does a woman develop menopause before 29 years old. At menopause, your ovaries stop producing the female hormones, estrogen and progesterone. This can cause undesirable symptoms and also affect your health. Sometimes the symptoms may occur 4 to 5 years before the menopause begins. There is no relationship between menopause and:  Oral contraceptives.   Number of children you had.   Race.   The age your menstrual periods started (menarche).  Heavy smokers and very thin women may develop menopause earlier in life. CAUSES  The ovaries stop producing the female hormones estrogen and progesterone.   Other causes include:   Surgery to remove both ovaries.   The ovaries stop functioning for no known reason.   Tumors of the pituitary gland in the brain.   Medical disease that affects the ovaries and hormone production.   Radiation treatment to the abdomen or pelvis.   Chemotherapy that affects the ovaries.  SYMPTOMS   Hot flashes.   Night sweats.   Decrease in sex drive.   Vaginal dryness and thinning of the vagina causing painful intercourse.   Dryness of the skin and developing wrinkles.   Headaches.   Tiredness.   Irritability.   Memory problems.   Weight gain.   Bladder infections.   Hair growth of the face and chest.   Infertility.  More serious symptoms include:  Loss of bone (osteoporosis) causing breaks (fractures).   Depression.   Hardening and narrowing of the arteries (atherosclerosis) causing heart attacks and strokes.  DIAGNOSIS   When the menstrual periods have stopped for 12 straight months.   Physical exam.   Hormone studies of the blood.  TREATMENT  There are many treatment choices and nearly  as many questions about them. The decisions to treat or not to treat menopausal changes is an individual choice made with your caregiver. Your caregiver can discuss the treatments with you. Together, you can decide which treatment will work best for you. Your treatment choices may include:   Hormone therapy (estorgen and progesterone).   Non-hormonal medications.   Treating the individual symptoms with medication (for example antidepressants for depression).   Herbal medications that may help specific symptoms.   Counseling by a psychiatrist or psychologist.   Group therapy.   Lifestyle changes including:   Eating healthy.   Regular exercise.   Limiting caffeine and alcohol.   Stress management and meditation.   No treatment.  HOME CARE INSTRUCTIONS   Take the medication your caregiver gives you as directed.   Get plenty of sleep and rest.   Exercise regularly.   Eat a diet that contains calcium (good for the bones) and soy products (acts like estrogen hormone).   Avoid alcoholic beverages.   Do not smoke.   If you have hot flashes, dress in layers.   Take supplements, calcium and vitamin D to strengthen bones.   You can use over-the-counter lubricants or moisturizers for vaginal dryness.   Group therapy is sometimes very helpful.   Acupuncture may be helpful in some cases.  SEEK MEDICAL CARE IF:   You are not sure you are in menopause.   You are having menopausal symptoms and need advice and treatment.   You are still having menstrual periods after age 55.     You have pain with intercourse.   Menopause is complete (no menstrual period for 12 months) and you develop vaginal bleeding.   You need a referral to a specialist (gynecologist, psychiatrist or psychologist) for treatment.  SEEK IMMEDIATE MEDICAL CARE IF:   You have severe depression.   You have excessive vaginal bleeding.   You fell and think you have a broken bone.   You have pain when you  urinate.   You develop leg or chest pain.   You have a fast pounding heart beat (palpitations).   You have severe headaches.   You develop vision problems.   You feel a lump in your breast.   You have abdominal pain or severe indigestion.  Document Released: 09/11/2003 Document Revised: 03/03/2011 Document Reviewed: 04/18/2008 [facility] Patient Information 2012 [place], [place].Abscess An abscess (boil or furuncle) is an infected area that contains a collection of pus.  SYMPTOMS Signs and symptoms of an abscess include pain, tenderness, redness, or hardness. You may feel a moveable soft area under your skin. An abscess can occur anywhere in the body.  TREATMENT  A surgical cut (incision) may be made over your abscess to drain the pus. Gauze may be packed into the space or a drain may be looped through the abscess cavity (pocket). This provides a drain that will allow the cavity to heal from the inside outwards. The abscess may be painful for a few days, but should feel much better if it was drained.  Your abscess, if seen early, may not have localized and may not have been drained. If not, another appointment may be required if it does not get better on its own or with medications. HOME CARE INSTRUCTIONS   Only take over-the-counter or prescription medicines for pain, discomfort, or fever as directed by your caregiver.   Take your antibiotics as directed if they were prescribed. Finish them even if you start to feel better.   Keep the skin and clothes clean around your abscess.   If the abscess was drained, you will need to use gauze dressing to collect any draining pus. Dressings will typically need to be changed 3 or more times a day.   The infection may spread by skin contact with others. Avoid skin contact as much as possible.   Practice good hygiene. This includes regular hand washing, cover any draining skin lesions, and do not share personal care items.   If you participate in  sports, do not share athletic equipment, towels, whirlpools, or personal care items. Shower after every practice or tournament.   If a draining area cannot be adequately covered:   Do not participate in sports.   Children should not participate in day care until the wound has healed or drainage stops.   If your caregiver has given you a follow-up appointment, it is very important to keep that appointment. Not keeping the appointment could result in a much worse infection, chronic or permanent injury, pain, and disability. If there is any problem keeping the appointment, you must call back to this facility for assistance.  SEEK MEDICAL CARE IF:   You develop increased pain, swelling, redness, drainage, or bleeding in the wound site.   You develop signs of generalized infection including muscle aches, chills, fever, or a general ill feeling.   You have an oral temperature above 102 F (38.9 C).  MAKE SURE YOU:   Understand these instructions.   Will watch your condition.   Will get help right away if you  are not doing well or get worse.  Document Released: 03/31/2005 Document Revised: 03/03/2011 Document Reviewed: 01/23/2008 [facility] Patient Information 2012 [place], [place].

## 2011-08-04 NOTE — Progress Notes (Signed)
  Subjective:    Patient ID: [name], female    DOB: [birth year], 31 y.o.   MRN: [id number]  HPI This is a 31 y.o. female with a long medical history. She presents c/o RLQ pain which she believes is related to an ovarian cyst. She also requests a pap smear and STD tests. She is also c/o infection with pain on her right index finger which began 2 days ago.   She has an extensive abdominal pain history. She had an expl. 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 She then had an elective LVH/BSO by Dr Marice Potter in March 2008 for fibroids and chronic pain unrelieved by medical therapies.   Since then, she has had a cholecystectomy and bouts of pancreatitis. She has had multiple recent visits for what she says is pancreatitis, but ED MDs found no evidence for this. There are several mentions of pain/drug-seeking behaviours.   The only abnormality on her January labs in ED was Lipase of 72  She also requests refill on her Estrace for hot flashes.  Review of Systems RLQ pain Hot flashes    Objective:   Physical Exam Abdomen soft, no rebound or guarding. Pelvic:  EGBUS normal             Vaginal normal, pink well rugated              Cervix surgically absent              Uterus and ovaries surgically absent              Tender to palpation RLQ Right index finger has a circular ring of pustules on the dorsal aspect or the proximal digit. No flakiness (does not appear to be tinea).  States is numb around the painful ring.  Entire ring of pustules is about 1-2cm round.     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  Instructed to go to Urgent Care if area is not starting to improve by 48 hrs.       Pt requests referral to Dr Carolynne Edouard, to revisit pelvic pain from his perspective. She states she thinks this might be her appendix.      Has appt with a "medical doctor" this week to establish primary care

## 2011-08-09 ENCOUNTER — Encounter (HOSPITAL_COMMUNITY): Payer: Self-pay | Admitting: Physical Medicine and Rehabilitation

## 2011-08-09 ENCOUNTER — Encounter (HOSPITAL_COMMUNITY): Payer: Self-pay | Admitting: Emergency Medicine

## 2011-08-09 ENCOUNTER — Observation Stay (HOSPITAL_COMMUNITY)
Admission: EM | Admit: 2011-08-09 | Discharge: 2011-08-11 | Disposition: A | Discharge status: Home / Self Care | Payer: Medicaid Other | Attending: Internal Medicine | Admitting: Internal Medicine

## 2011-08-09 ENCOUNTER — Emergency Department (HOSPITAL_COMMUNITY)
Admission: EM | Admit: 2011-08-09 | Discharge: 2011-08-09 | Disposition: A | Discharge status: Home / Self Care | Payer: Medicaid Other | Attending: Emergency Medicine | Admitting: Emergency Medicine

## 2011-08-09 DIAGNOSIS — R1013 Epigastric pain: Secondary | ICD-10-CM | POA: Insufficient documentation

## 2011-08-09 DIAGNOSIS — F339 Major depressive disorder, recurrent, unspecified: Secondary | ICD-10-CM | POA: Diagnosis present

## 2011-08-09 DIAGNOSIS — F6812 Factitious disorder with predominantly physical signs and symptoms: Secondary | ICD-10-CM | POA: Diagnosis present

## 2011-08-09 DIAGNOSIS — Z9089 Acquired absence of other organs: Secondary | ICD-10-CM

## 2011-08-09 DIAGNOSIS — K861 Other chronic pancreatitis: Secondary | ICD-10-CM

## 2011-08-09 DIAGNOSIS — R197 Diarrhea, unspecified: Secondary | ICD-10-CM | POA: Insufficient documentation

## 2011-08-09 DIAGNOSIS — R112 Nausea with vomiting, unspecified: Secondary | ICD-10-CM | POA: Insufficient documentation

## 2011-08-09 DIAGNOSIS — D649 Anemia, unspecified: Secondary | ICD-10-CM | POA: Insufficient documentation

## 2011-08-09 DIAGNOSIS — R1011 Right upper quadrant pain: Principal | ICD-10-CM | POA: Diagnosis present

## 2011-08-09 DIAGNOSIS — L03019 Cellulitis of unspecified finger: Secondary | ICD-10-CM

## 2011-08-09 DIAGNOSIS — K859 Acute pancreatitis without necrosis or infection, unspecified: Secondary | ICD-10-CM

## 2011-08-09 DIAGNOSIS — N951 Menopausal and female climacteric states: Secondary | ICD-10-CM

## 2011-08-09 DIAGNOSIS — R109 Unspecified abdominal pain: Secondary | ICD-10-CM | POA: Diagnosis present

## 2011-08-09 HISTORY — DX: Unspecified abdominal pain: R10.9

## 2011-08-09 HISTORY — DX: Other chronic pain: G89.29

## 2011-08-09 LAB — COMPREHENSIVE METABOLIC PANEL
Albumin: 3.6 g/dL (ref 3.5–5.2)
BUN: 17 mg/dL (ref 6–23)
Calcium: 9.4 mg/dL (ref 8.4–10.5)
Creatinine, Ser: 0.63 mg/dL (ref 0.50–1.10)
GFR calc Af Amer: >90 mL/min (ref >90)
Potassium: 3.9 mEq/L (ref 3.5–5.1)
Total Protein: 7.7 g/dL (ref 6.0–8.3)

## 2011-08-09 LAB — URINALYSIS, ROUTINE W REFLEX MICROSCOPIC
Hgb urine dipstick: NEGATIVE
Ketones, ur: NEGATIVE mg/dL
Protein, ur: NEGATIVE mg/dL
Urobilinogen, UA: 1 mg/dL (ref 0.0–1.0)

## 2011-08-09 LAB — CBC
HCT: 36.9 % (ref 36.0–46.0)
MCH: 27.3 pg (ref 26.0–34.0)
MCHC: 33.3 g/dL (ref 30.0–36.0)
MCV: 81.8 fL (ref 78.0–100.0)
RDW: 12 % (ref 11.5–15.5)

## 2011-08-09 LAB — LIPASE, BLOOD: Lipase: 164 U/L — ABNORMAL HIGH (ref 11–59)

## 2011-08-09 LAB — PREGNANCY, URINE: Preg Test, Ur: NEGATIVE

## 2011-08-09 MED ORDER — PANTOPRAZOLE SODIUM 40 MG IV SOLR: 40.0000 mg | Freq: Once | INTRAVENOUS | Status: DC

## 2011-08-09 MED ORDER — HYDROMORPHONE HCL PF 2 MG/ML IJ SOLN
INTRAMUSCULAR | Status: AC
  Filled 2011-08-09: qty 1

## 2011-08-09 MED ORDER — SODIUM CHLORIDE 0.9 % IV BOLUS (SEPSIS)
1000.0000 mL | Freq: Once | INTRAVENOUS | Status: AC
  Administered 2011-08-10: 1000 mL via INTRAVENOUS

## 2011-08-09 MED ORDER — ONDANSETRON HCL 4 MG/2ML IJ SOLN: 4.0000 mg | Freq: Once | INTRAMUSCULAR | Status: DC

## 2011-08-09 MED ORDER — ONDANSETRON 4 MG PO TBDP
8.0000 mg | ORAL_TABLET | Freq: Once | ORAL | Status: AC
  Administered 2011-08-09: 8 mg via ORAL
  Filled 2011-08-09: qty 2

## 2011-08-09 MED ORDER — ONDANSETRON 4 MG PO TBDP
8.0000 mg | ORAL_TABLET | Freq: Once | ORAL | Status: AC
  Administered 2011-08-09: 8 mg via ORAL
  Filled 2011-08-09 (×2): qty 2

## 2011-08-09 MED ORDER — ONDANSETRON 4 MG PO TBDP: 8.0000 mg | ORAL_TABLET | Freq: Once | ORAL | Status: DC

## 2011-08-09 MED ORDER — DIPHENHYDRAMINE HCL 50 MG/ML IJ SOLN: 12.5000 mg | Freq: Once | INTRAMUSCULAR | Status: DC

## 2011-08-09 MED ORDER — SODIUM CHLORIDE 0.9 % IV BOLUS (SEPSIS): 1000.0000 mL | Freq: Once | INTRAVENOUS | Status: DC

## 2011-08-09 MED ORDER — PROMETHAZINE HCL 25 MG/ML IJ SOLN: 25.0000 mg | Freq: Once | INTRAMUSCULAR | Status: DC

## 2011-08-09 MED ORDER — PROMETHAZINE HCL 25 MG/ML IJ SOLN
25.0000 mg | Freq: Once | INTRAMUSCULAR | Status: DC
  Filled 2011-08-09: qty 1

## 2011-08-09 MED ORDER — PROMETHAZINE HCL 25 MG RE SUPP: 25.0000 mg | Freq: Four times a day (QID) | RECTAL | Status: DC | PRN

## 2011-08-09 MED ORDER — DIPHENHYDRAMINE HCL 50 MG/ML IJ SOLN
12.5000 mg | Freq: Once | INTRAMUSCULAR | Status: AC
  Administered 2011-08-09: 12.5 mg via INTRAMUSCULAR
  Filled 2011-08-09: qty 1

## 2011-08-09 MED ORDER — PROMETHAZINE HCL 25 MG/ML IJ SOLN
25.0000 mg | Freq: Once | INTRAMUSCULAR | Status: AC
  Administered 2011-08-09: 25 mg via INTRAMUSCULAR

## 2011-08-09 MED ORDER — HYDROMORPHONE HCL PF 2 MG/ML IJ SOLN: 2.0000 mg | Freq: Once | INTRAMUSCULAR | Status: DC

## 2011-08-09 MED ORDER — OXYCODONE-ACETAMINOPHEN 5-325 MG PO TABS
1.0000 | ORAL_TABLET | Freq: Once | ORAL | Status: AC
  Administered 2011-08-09: 1 via ORAL
  Filled 2011-08-09 (×2): qty 1

## 2011-08-09 MED ORDER — PROMETHAZINE HCL 25 MG PO TABS: 25.0000 mg | ORAL_TABLET | Freq: Four times a day (QID) | ORAL | Status: DC | PRN

## 2011-08-09 MED ORDER — HYDROMORPHONE HCL PF 2 MG/ML IJ SOLN
2.0000 mg | Freq: Once | INTRAMUSCULAR | Status: AC
  Administered 2011-08-09: 2 mg via INTRAMUSCULAR

## 2011-08-09 MED ORDER — HYDROMORPHONE HCL PF 1 MG/ML IJ SOLN
0.5000 mg | Freq: Once | INTRAMUSCULAR | Status: AC
  Administered 2011-08-10: 1 mg via INTRAVENOUS
  Filled 2011-08-09: qty 1

## 2011-08-09 MED ORDER — ONDANSETRON HCL 4 MG/2ML IJ SOLN
4.0000 mg | Freq: Once | INTRAMUSCULAR | Status: AC
  Administered 2011-08-10: 4 mg via INTRAVENOUS
  Filled 2011-08-09: qty 2

## 2011-08-09 MED ORDER — OXYCODONE HCL 5 MG PO TABA: 1.0000 | ORAL_TABLET | ORAL | Status: DC | PRN

## 2011-08-09 MED ORDER — HYDROMORPHONE HCL PF 1 MG/ML IJ SOLN
1.0000 mg | Freq: Once | INTRAMUSCULAR | Status: DC
  Filled 2011-08-09: qty 1

## 2011-08-09 NOTE — ED Notes (Signed)
IV team called for pt.   

## 2011-08-09 NOTE — ED Notes (Signed)
PT. REPORTS UPPER ABDOMINAL PAIN FOR SEVERAL WEEKS , SEEN HERE TODAY DISCHARGED HOME WITH PRESCRIPTION MEDICATIONS.

## 2011-08-09 NOTE — ED Provider Notes (Signed)
I was the primary provider of this patient during this ER visit. The patients care was continued in the CDU and managed in conjunction with my midlevel providers  1. PANCREATITIS, CHRONIC      Lyanne Co, MD 08/09/11 2245

## 2011-08-09 NOTE — ED Notes (Signed)
Pt drowsy and somnolent. PO challenge to be given.

## 2011-08-09 NOTE — ED Provider Notes (Signed)
History     CSN: [id number]  Arrival date & time 08/09/11  4098   First MD Initiated Contact with Patient 08/09/11 [phone]      Chief Complaint  Patient presents with  . Nausea  . Emesis  . Abdominal Pain    (Consider location/radiation/quality/duration/timing/severity/associated sxs/prior treatment) The history is provided by the patient and medical records.   patient with a long-standing history of abdominal pain including history of chronic pancreatitis.  She presents with approximately 34 weeks of nausea vomiting and diarrhea.  She reports her pain is typical for pancreatitis.  Is located in the epigastrium.  She reports nonbloody nonbilious vomiting.  She denies fevers or chills.  She denies melena and hematochezia.  Denies chest pain shortness of breath.  Her pain is worsened by movement and palpation.  Her pain is improved by nothing.  She's had a cholecystectomy as well as an abdominal hysterectomy.  Her pain is time is moderate to severe.  Past Medical History  Diagnosis Date  . Pancreatitis   . S/P laparoscopic cholecystectomy   . Fibroids   . Ovarian cyst     Past Surgical History  Procedure Date  . Cholecystectomy   . Abdominal hysterectomy   . Dilation and curettage of uterus     Family History  Problem Relation Age of Onset  . Diabetes Mother   . Hypertension Mother   . Hypertension Father   . Diabetes Father   . Asthma Daughter     History  Substance Use Topics  . Smoking status: Current Everyday Smoker -- 0.5 packs/day for 10 years    Types: Cigarettes  . Smokeless tobacco: Never Used   Comment: declined  . Alcohol Use: No    OB History    Grav Para Term Preterm Abortions TAB SAB Ect Mult Living   4 2 1 1 2  2          Review of Systems  Gastrointestinal: Positive for vomiting and abdominal pain.  All other systems reviewed and are negative.    Allergies  Celecoxib; Ketorolac tromethamine; Meperidine hcl; Ibuprofen; Morphine; Propoxyphene  n-acetaminophen; and Tramadol hcl  Home Medications   Current Outpatient Rx  Name Route Sig Dispense Refill  . CEPHALEXIN 500 MG PO CAPS Oral Take 1 capsule (500 mg total) by mouth 4 (four) times daily. 40 capsule 0  . ESTRADIOL 2 MG PO TABS Oral Take 1 tablet (2 mg total) by mouth at bedtime. 30 tablet 3    BP 119/79  Pulse 74  Temp(Src) 97.6 F (36.4 C) (Oral)  Resp 18  SpO2 99%  LMP 06/01/2011  Physical Exam  Nursing note and vitals reviewed. Constitutional: She is oriented to person, place, and time. She appears well-developed and well-nourished. No distress.  HENT:  Head: Normocephalic and atraumatic.  Eyes: EOM are normal.  Neck: Normal range of motion.  Cardiovascular: Normal rate, regular rhythm and normal heart sounds.   Pulmonary/Chest: Effort normal and breath sounds normal.  Abdominal: Soft. She exhibits no distension.       Mild epigastric tenderness  Musculoskeletal: Normal range of motion.  Neurological: She is alert and oriented to person, place, and time.  Skin: Skin is warm and dry.  Psychiatric: She has a normal mood and affect. Judgment normal.    ED Course  Procedures (including critical care time)  Labs Reviewed  URINALYSIS, ROUTINE W REFLEX MICROSCOPIC - Abnormal; Notable for the following:    APPearance HAZY (*)    Specific Gravity,  Urine 1.034 (*)    All other components within normal limits  COMPREHENSIVE METABOLIC PANEL - Abnormal; Notable for the following:    Total Bilirubin 0.2 (*)    All other components within normal limits  LIPASE, BLOOD - Abnormal; Notable for the following:    Lipase 164 (*)    All other components within normal limits  PREGNANCY, URINE  CBC   No results found.   No diagnosis found.    MDM  Mild elevation in her lipase.  This likely represents acute on chronic pancreatitis. Will tx pain and will orally challenge this pt with fluids. Care to be continued in the CDU.         Lyanne Co,  MD 08/09/11 2245

## 2011-08-09 NOTE — ED Notes (Signed)
Pt presents to department for evaluation of diffuse abdominal pain, N/V and diarrhea. Ongoing x4 weeks. No relief from symptoms at home. Pt alert and oriented x4. Ambulatory to exam room.

## 2011-08-09 NOTE — ED Notes (Signed)
Pt tolerating fluids.   

## 2011-08-09 NOTE — ED Notes (Signed)
Pt presents to department for evaluation of diffuse abdominal pain, and N/V/D. Ongoing x4 week. No relief from symptoms at home. 9/10 abdominal pain at the time. Abdomen soft and non tender to palpation. Bowel sounds present all quadrants. Denies urinary symptoms. She is alert and oriented x4. Skin warm and dry. No signs of distress at the time.

## 2011-08-09 NOTE — ED Notes (Signed)
Pt resting quietly at the time. States 7/10 abdominal pain. No active vomiting. No signs of distress noted at present.

## 2011-08-09 NOTE — ED Notes (Signed)
Introduced self to pt; pt sitting on stretcher

## 2011-08-09 NOTE — ED Provider Notes (Signed)
History     CSN: [id number]  Arrival date & time 08/09/11  2007   First MD Initiated Contact with Patient 08/09/11 2217      Chief Complaint  Patient presents with  . Abdominal Pain    (Consider location/radiation/quality/duration/timing/severity/associated sxs/prior treatment) HPI History provided by pt and prior chart.  Per prior chart, pt presented to ED this morning w/ c/o N/V/D and abdominal pain x 4 weeks.  Has h/o chronic pancreatitis and reported that this pain was typical.  Labs sig for elevated lipase at 164.  Pt treated w/ IM dilaudid and po zofran and d/c'd home when she was tolerating pos.  Return to the ER because her pain has not improved and she is not tolerating her medications d/t vomiting and diarrhea (at least 10 and 20 episodes respectively since returning home today).  Denies fever and chills, hematemesis, hematochezia, melena and urinary sx.  Past abd surgeries include cholecystectomy and hysterectomy.    Past Medical History  Diagnosis Date  . Pancreatitis   . S/P laparoscopic cholecystectomy   . Fibroids   . Ovarian cyst   . Chronic abdominal pain     Past Surgical History  Procedure Date  . Cholecystectomy   . Abdominal hysterectomy   . Dilation and curettage of uterus     Family History  Problem Relation Age of Onset  . Diabetes Mother   . Hypertension Mother   . Hypertension Father   . Diabetes Father   . Asthma Daughter     History  Substance Use Topics  . Smoking status: Current Everyday Smoker -- 0.5 packs/day for 10 years    Types: Cigarettes  . Smokeless tobacco: Never Used   Comment: declined  . Alcohol Use: No    OB History    Grav Para Term Preterm Abortions TAB SAB Ect Mult Living   4 2 1 1 2  2          Review of Systems  All other systems reviewed and are negative.    Allergies  Celecoxib; Ketorolac tromethamine; Meperidine hcl; Ibuprofen; Morphine; Propoxyphene n-acetaminophen; and Tramadol hcl  Home Medications     Current Outpatient Rx  Name Route Sig Dispense Refill  . CEPHALEXIN 500 MG PO CAPS Oral Take 1 capsule (500 mg total) by mouth 4 (four) times daily. 40 capsule 0  . ESTRADIOL 2 MG PO TABS Oral Take 1 tablet (2 mg total) by mouth at bedtime. 30 tablet 3  . OXYCODONE HCL (ABUSE DETER) 5 MG PO TABS Oral Take 1 tablet by mouth every 4 (four) hours as needed. For pain    . PROMETHAZINE HCL 25 MG RE SUPP Rectal Place 1 suppository (25 mg total) rectally every 6 (six) hours as needed for nausea. 12 each 0  . PROMETHAZINE HCL 25 MG PO TABS Oral Take 1 tablet (25 mg total) by mouth every 6 (six) hours as needed for nausea. 15 tablet 0    BP 122/68  Pulse 108  Temp(Src) 98.1 F (36.7 C) (Oral)  Resp 18  SpO2 97%  LMP 06/01/2011  Physical Exam  Nursing note and vitals reviewed. Constitutional: She is oriented to person, place, and time. She appears well-developed and well-nourished. No distress.       Pt does not appear uncomfortable, even with walking.   HENT:  Head: Normocephalic and atraumatic.  Eyes:       Normal appearance  Neck: Normal range of motion.  Cardiovascular: Normal rate and regular rhythm.  Pulmonary/Chest: Effort normal and breath sounds normal.  Abdominal: Soft. Bowel sounds are normal. She exhibits no distension and no mass. There is no rebound and no guarding.       Mild-mod tenderness epigastrium and LUQ  Neurological: She is alert and oriented to person, place, and time.  Skin: Skin is warm and dry. No rash noted.  Psychiatric: She has a normal mood and affect. Her behavior is normal.    ED Course  Procedures (including critical care time)  Labs Reviewed - No data to display No results found.   1. Pancreatitis       MDM  Pt presents w/ complaint of abd pain, N/V/D; sx consistent w/ acute on chronic pancreatitis.  Elevated lipase in ED this am and d/c'd home with anti-emetic after tolerating pos.  Returns because sx have worsened.  Pt in NAD, does not  appear uncomfortable, afebrile, abd benign and epigastric/LUQ ttp on exam.  Will give a dose of po zofran and oxycodone and d/c pt home if she doesn't vomit.   Pt vomited her medications.  I am very suspicious of malingering but can not say definitively.  IV NS, zofran and 0.5mg  dilaudid ordered.  [facility] consulted for admission for acute on chronic pancreatitis.  12:08 AM         Otilio Miu, PA 08/10/11 [phone]

## 2011-08-09 NOTE — ED Notes (Signed)
Report given to Alycia Rossetti, Charity fundraiser. Pt moved to CDU 2

## 2011-08-09 NOTE — ED Provider Notes (Signed)
I assumed care of the pt in the CDU. Pt with hx of chronic pancreatitis presented with abd pain and N/V/D. Labs significant for lipase of 164 which is elevated more significantly than her baseline. She was moved to CDU for pain control.  On exam, abd soft, minimally tender in epigastrium, nondistended. +BS.  Pt given Dilaudid and antiemetic x 1 and did well with this. Stated her pain was significantly improved. She was able to tolerate PO fluids without emesis. Encouraged her to stick to clears today then slowly advance diet. She was encouraged to make f/u with PCP. Return precautions discussed.  Grant Fontana, [place] 08/09/11 2002

## 2011-08-09 NOTE — ED Notes (Signed)
Patient refused oral medication, MD made aware.

## 2011-08-10 DIAGNOSIS — R112 Nausea with vomiting, unspecified: Secondary | ICD-10-CM | POA: Diagnosis present

## 2011-08-10 LAB — CBC
HCT: 34.3 % — ABNORMAL LOW (ref 36.0–46.0)
MCH: 27.4 pg (ref 26.0–34.0)
MCHC: 33.5 g/dL (ref 30.0–36.0)
RBC: 4.2 MIL/uL (ref 3.87–5.11)
RDW: 11.9 % (ref 11.5–15.5)
WBC: 7.8 10*3/uL (ref 4.0–10.5)

## 2011-08-10 LAB — CREATININE, SERUM: Creatinine, Ser: 0.67 mg/dL (ref 0.50–1.10)

## 2011-08-10 LAB — MRSA PCR SCREENING: MRSA by PCR: NEGATIVE

## 2011-08-10 MED ORDER — DIPHENHYDRAMINE HCL 50 MG/ML IJ SOLN
25.0000 mg | Freq: Four times a day (QID) | INTRAMUSCULAR | Status: DC | PRN
  Administered 2011-08-10 – 2011-08-11 (×6): 25 mg via INTRAVENOUS
  Filled 2011-08-10 (×7): qty 1

## 2011-08-10 MED ORDER — HYDROMORPHONE HCL PF 1 MG/ML IJ SOLN: 0.5000 mg | INTRAMUSCULAR | Status: DC | PRN

## 2011-08-10 MED ORDER — ONDANSETRON HCL 4 MG/2ML IJ SOLN: 4.0000 mg | Freq: Four times a day (QID) | INTRAMUSCULAR | Status: DC | PRN

## 2011-08-10 MED ORDER — ONDANSETRON HCL 4 MG/2ML IJ SOLN: 4.0000 mg | Freq: Three times a day (TID) | INTRAMUSCULAR | Status: DC | PRN

## 2011-08-10 MED ORDER — CEPHALEXIN 500 MG PO CAPS
500.0000 mg | ORAL_CAPSULE | Freq: Four times a day (QID) | ORAL | Status: DC
  Filled 2011-08-10 (×4): qty 1

## 2011-08-10 MED ORDER — KCL IN DEXTROSE-NACL 40-5-0.9 MEQ/L-%-% IV SOLN
INTRAVENOUS | Status: DC
  Administered 2011-08-10 – 2011-08-11 (×3): via INTRAVENOUS
  Filled 2011-08-10 (×7): qty 1000

## 2011-08-10 MED ORDER — ESTRADIOL 2 MG PO TABS
2.0000 mg | ORAL_TABLET | Freq: Every day | ORAL | Status: DC
  Administered 2011-08-10: 2 mg via ORAL
  Filled 2011-08-10 (×2): qty 1

## 2011-08-10 MED ORDER — DIPHENHYDRAMINE HCL 25 MG PO CAPS: 25.0000 mg | ORAL_CAPSULE | ORAL | Status: DC | PRN

## 2011-08-10 MED ORDER — HYDROMORPHONE HCL PF 1 MG/ML IJ SOLN
1.0000 mg | INTRAMUSCULAR | Status: DC | PRN
  Administered 2011-08-10 – 2011-08-11 (×5): 1 mg via INTRAVENOUS
  Filled 2011-08-10 (×5): qty 1

## 2011-08-10 MED ORDER — ONDANSETRON HCL 4 MG PO TABS: 4.0000 mg | ORAL_TABLET | Freq: Four times a day (QID) | ORAL | Status: DC | PRN

## 2011-08-10 MED ORDER — ENOXAPARIN SODIUM 40 MG/0.4ML ~~LOC~~ SOLN
40.0000 mg | SUBCUTANEOUS | Status: DC
  Administered 2011-08-10 – 2011-08-11 (×2): 40 mg via SUBCUTANEOUS
  Filled 2011-08-10 (×2): qty 0.4

## 2011-08-10 MED ORDER — ARTIFICIAL TEARS OP OINT
TOPICAL_OINTMENT | OPHTHALMIC | Status: DC | PRN
  Administered 2011-08-11 (×3): via OPHTHALMIC
  Filled 2011-08-10 (×3): qty 3.5

## 2011-08-10 MED ORDER — HYDROMORPHONE HCL PF 1 MG/ML IJ SOLN
INTRAMUSCULAR | Status: AC
  Administered 2011-08-10: 1 mg via INTRAVENOUS
  Filled 2011-08-10: qty 1

## 2011-08-10 MED ORDER — HYDROMORPHONE HCL PF 1 MG/ML IJ SOLN
0.5000 mg | INTRAMUSCULAR | Status: DC | PRN
  Administered 2011-08-10: 0.5 mg via INTRAVENOUS
  Filled 2011-08-10: qty 1

## 2011-08-10 MED ORDER — SODIUM CHLORIDE 0.9 % IV SOLN: INTRAVENOUS | Status: AC

## 2011-08-10 NOTE — Consult Note (Signed)
Reason for Consult:Pancreatitis Referring Physician: [facility]  Benjaman Kindler HPI: This is a 31 year old female with reports of pancreatitis.  She states that her symptoms started four weeks ago and prior to this time she was well.  In the past she has recurrent episodes of epigastric pain.  Her current lipase is at 160's and recently is was within the normal range.  In the past she was evaluated by [facility], but it is not clear that she had an ERCP.  The last MRCP on 09/2010 suggests the possibility of pancreas divisim, however, the MRCP in 2010 did not comment about this issue.  Apparently she was supposed to go to [facility] for further evaluation, but she did not have transportation and she was then discharged from the [place] practice.  Currently she has nausea and vomiting as well as epigastric pain.  Past Medical History  Diagnosis Date  . Pancreatitis   . S/P laparoscopic cholecystectomy   . Fibroids   . Ovarian cyst   . Chronic abdominal pain     Past Surgical History  Procedure Date  . Cholecystectomy   . Abdominal hysterectomy   . Dilation and curettage of uterus     Family History  Problem Relation Age of Onset  . Diabetes Mother   . Hypertension Mother   . Hypertension Father   . Diabetes Father   . Asthma Daughter     Social History:  reports that she has been smoking Cigarettes.  She has a 5 pack-year smoking history. She has never used smokeless tobacco. She reports that she does not drink alcohol or use illicit drugs.  Allergies:  Allergies  Allergen Reactions  . Celecoxib Anaphylaxis and Swelling  . Ketorolac Tromethamine Anaphylaxis  . Meperidine Hcl Anaphylaxis  . Ibuprofen Rash  . Morphine Rash  . Propoxyphene N-Acetaminophen Rash  . Tramadol Hcl Rash    Medications:  Scheduled:   . sodium chloride   Intravenous STAT  . enoxaparin  40 mg Subcutaneous Q24H  . estradiol  2 mg Oral QHS  .  HYDROmorphone (DILAUDID) injection  0.5 mg  Intravenous Once  . ondansetron (ZOFRAN) IV  4 mg Intravenous Once  . ondansetron  8 mg Oral Once  . oxyCODONE-acetaminophen  1 tablet Oral Once  . sodium chloride  1,000 mL Intravenous Once  . DISCONTD: cephALEXin  500 mg Oral QID  . DISCONTD: pantoprazole (PROTONIX) IV  40 mg Intravenous Once   Continuous:   . dextrose 5 % and 0.9 % NaCl with KCl 40 mEq/L 100 mL/hr at 08/10/11 1415    Results for orders placed during the hospital encounter of 08/09/11 (from the past 24 hour(s))  CBC     Status: Abnormal   Collection Time   08/10/11  2:28 AM      Component Value Range   WBC 7.8  4.0 - 10.5 (K/uL)   RBC 4.20  3.87 - 5.11 (MIL/uL)   Hemoglobin 11.5 (*) 12.0 - 15.0 (g/dL)   HCT 16.1 (*) 09.6 - 46.0 (%)   MCV 81.7  78.0 - 100.0 (fL)   MCH 27.4  26.0 - 34.0 (pg)   MCHC 33.5  30.0 - 36.0 (g/dL)   RDW 04.5  40.9 - 81.1 (%)   Platelets 257  150 - 400 (K/uL)  CREATININE, SERUM     Status: Normal   Collection Time   08/10/11  2:28 AM      Component Value Range   Creatinine, Ser 0.67  0.50 - 1.10 (mg/dL)   GFR calc non Af Amer >90  >90 (mL/min)   GFR calc Af Amer >90  >90 (mL/min)  MRSA PCR SCREENING     Status: Normal   Collection Time   08/10/11  2:39 AM      Component Value Range   MRSA by PCR NEGATIVE  NEGATIVE      No results found.  ROS:  As stated above in the HPI otherwise negative.  Blood pressure 107/80, pulse 75, temperature 98.1 F (36.7 C), temperature source Oral, resp. rate 18, height 5\' 5"  (1.651 m), weight 72.9 kg (160 lb 11.5 oz), last menstrual period 06/01/2011, SpO2 98.00%.    PE: Gen: NAD, Alert and Oriented HEENT:  [place]/AT, EOMI Neck: Supple, no LAD Lungs: CTA Bilaterally CV: RRR without M/G/R ABM: Soft, epigastric pain, +BS Ext: No C/C/E  Assessment/Plan: 1) ? Pancreatitis. 2) Epigastric abdominal pain   Her history of rather complex and I will need to call [facility] to obtain more records.  I am uncertain if she underwent an ERCP in the past, but  this would be definitive in finding out if she has pancreas divisim.    Plan: 1) Continue with supportive care. 2) I will obtain more records from [facility] before finalizing any decisions about her care.  If she needs to have an ERCP, I told her that she is at high risk for post-ERCP pancreatitis. Brittaney Beaulieu D 08/10/2011, 4:16 PM

## 2011-08-10 NOTE — Progress Notes (Signed)
Per patients request called Dr Conley Rolls about change in medication. New orders Benadryl 25mg  IV or PO every 4 hours PRN and Toradol 30 mg IV every 8 hrs. Patient reports being allergic to Toradol.

## 2011-08-10 NOTE — ED Notes (Signed)
Dr. Le in to assess pt for admission.   

## 2011-08-10 NOTE — H&P (Signed)
PCP:  None  Chief Complaint: Persistent nausea and vomiting   HPI: [name] is an 31 y.o. 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 She has history of chronic pancreatitis dating back about a month ago, and since then  she has been having problem with eating. She states that every time she eats, she would get abdominal pain, nausea, vomiting, and diarrhea. Laboratory done this morning showed a normal white count, hemoglobin, and liver function tests. She does have elevated lipase to 164. She denied fever, chills, black stool, bloody stool, or any other symptomology. She was to see the surgeon doctor Toad, but has not done so. Because she was not able to take anything by mouth, hospitalist was asked to admit her for chronic pancreatitis.  Rewiew of Systems:  The patient denies fever, weight loss,, vision loss, decreased hearing, hoarseness, chest pain, syncope, dyspnea on exertion, peripheral edema, balance deficits, hemoptysis, melena, hematochezia, severe indigestion/heartburn, hematuria, incontinence, genital sores, muscle weakness, suspicious skin lesions, transient blindness, difficulty walking, depression, unusual weight change, abnormal bleeding, enlarged lymph nodes, angioedema, and breast masses.   Past Medical History  Diagnosis Date  . Pancreatitis   . S/P laparoscopic cholecystectomy   . Fibroids   . Ovarian cyst   . Chronic abdominal pain     Past Surgical History  Procedure Date  . Cholecystectomy   . Abdominal hysterectomy   . Dilation and curettage of uterus     Medications:  HOME MEDS: Prior to Admission medications   Medication Sig Start Date End Date Taking? Authorizing Provider  cephALEXin (KEFLEX) 500 MG capsule Take 1 capsule (500 mg total) by mouth 4 (four) times  daily. 08/04/11 08/14/11 Yes Wynelle Bourgeois, CNM  estradiol (ESTRACE) 2 MG tablet Take 1 tablet (2 mg total) by mouth at bedtime. 08/04/11  Yes Wynelle Bourgeois, CNM  OxyCODONE HCl, Abuse Deter, 5 MG TABS Take 1 tablet by mouth every 4 (four) hours as needed. For pain 08/09/11  Yes Grant Fontana, PA  promethazine (PHENERGAN) 25 MG suppository Place 1 suppository (25 mg total) rectally every 6 (six) hours as needed for nausea. 08/09/11 08/16/11 Yes Grant Fontana, PA  promethazine (PHENERGAN) 25 MG tablet Take 1 tablet (25 mg total) by mouth every 6 (six) hours as needed for nausea. 08/09/11 08/16/11 Yes Grant Fontana, PA     Allergies:  Allergies  Allergen Reactions  . Celecoxib Anaphylaxis and Swelling  . Ketorolac Tromethamine Anaphylaxis  . Meperidine Hcl Anaphylaxis  . Ibuprofen Rash  . Morphine Rash  . Propoxyphene N-Acetaminophen Rash  . Tramadol Hcl Rash    Social History:   reports that she has been smoking Cigarettes.  She has a 5 pack-year smoking history. She has never used smokeless tobacco. She reports that she does not drink alcohol or use illicit drugs.  Family History: Family History  Problem Relation Age of Onset  . Diabetes Mother   . Hypertension Mother   . Hypertension Father   . Diabetes Father   . Asthma Daughter      Physical Exam: Filed Vitals:   08/09/11 2042 08/09/11 2356  BP: 122/68 131/87  Pulse: 108 88  Temp: 98.1 F (36.7 C)   TempSrc: Oral   Resp: 18 16  SpO2: 97% 96%   Blood pressure 131/87, pulse 88, temperature 98.1 F (36.7 C), temperature source Oral, resp.  rate 16, last menstrual period 06/01/2011, SpO2 96.00%.  GEN:  Pleasant  person lying in the stretcher in no acute distress; cooperative with exam PSYCH:  alert and oriented x4; does not appear anxious does not appear depressed; affect is normal HEENT: Mucous membranes pink and anicteric; PERRLA; EOM intact; no cervical lymphadenopathy nor thyromegaly or carotid bruit; no  JVD; Breasts:: Not examined CHEST WALL: No tenderness CHEST: Normal respiration, clear to auscultation bilaterally HEART: Regular rate and rhythm; no murmurs rubs or gallops BACK: No kyphosis or scoliosis; no CVA tenderness ABDOMEN: Obese, soft , minimally tender over the epigastric and right upper quadrant area; no masses, no organomegaly, normal abdominal bowel sounds; no pannus; no intertriginous candida. Rectal Exam: Not done EXTREMITIES: No bone or joint deformity; age-appropriate arthropathy of the hands and knees; no edema; no ulcerations. Genitalia: not examined PULSES: 2+ and symmetric SKIN: Normal hydration no rash or ulceration CNS: Cranial nerves 2-12 grossly intact no focal neurologic deficit   Labs & Imaging Results for orders placed during the hospital encounter of 08/09/11 (from the past 48 hour(s))  URINALYSIS, ROUTINE W REFLEX MICROSCOPIC     Status: Abnormal   Collection Time   08/09/11  8:03 AM      Component Value Range Comment   Color, Urine YELLOW  YELLOW     APPearance HAZY (*) CLEAR     Specific Gravity, Urine 1.034 (*) 1.005 - 1.030     pH 5.5  5.0 - 8.0     Glucose, UA NEGATIVE  NEGATIVE (mg/dL)    Hgb urine dipstick NEGATIVE  NEGATIVE     Bilirubin Urine NEGATIVE  NEGATIVE     Ketones, ur NEGATIVE  NEGATIVE (mg/dL)    Protein, ur NEGATIVE  NEGATIVE (mg/dL)    Urobilinogen, UA 1.0  0.0 - 1.0 (mg/dL)    Nitrite NEGATIVE  NEGATIVE     Leukocytes, UA NEGATIVE  NEGATIVE  MICROSCOPIC NOT DONE ON URINES WITH NEGATIVE PROTEIN, BLOOD, LEUKOCYTES, NITRITE, OR GLUCOSE <1000 mg/dL.  PREGNANCY, URINE     Status: Normal   Collection Time   08/09/11  8:03 AM      Component Value Range Comment   Preg Test, Ur NEGATIVE  NEGATIVE    CBC     Status: Normal   Collection Time   08/09/11  8:20 AM      Component Value Range Comment   WBC 7.2  4.0 - 10.5 (K/uL)    RBC 4.51  3.87 - 5.11 (MIL/uL)    Hemoglobin 12.3  12.0 - 15.0 (g/dL)    HCT 19.1  47.8 - 29.5 (%)    MCV 81.8   78.0 - 100.0 (fL)    MCH 27.3  26.0 - 34.0 (pg)    MCHC 33.3  30.0 - 36.0 (g/dL)    RDW 62.1  30.8 - 65.7 (%)    Platelets 261  150 - 400 (K/uL)   COMPREHENSIVE METABOLIC PANEL     Status: Abnormal   Collection Time   08/09/11  8:20 AM      Component Value Range Comment   Sodium 140  135 - 145 (mEq/L)    Potassium 3.9  3.5 - 5.1 (mEq/L)    Chloride 107  96 - 112 (mEq/L)    CO2 25  19 - 32 (mEq/L)    Glucose, Bld 92  70 - 99 (mg/dL)    BUN 17  6 - 23 (mg/dL)    Creatinine, Ser 8.46  0.50 - 1.10 (mg/dL)  Calcium 9.4  8.4 - 10.5 (mg/dL)    Total Protein 7.7  6.0 - 8.3 (g/dL)    Albumin 3.6  3.5 - 5.2 (g/dL)    AST 16  0 - 37 (U/L)    ALT 9  0 - 35 (U/L)    Alkaline Phosphatase 61  39 - 117 (U/L)    Total Bilirubin 0.2 (*) 0.3 - 1.2 (mg/dL)    GFR calc non Af Amer >90  >90 (mL/min)    GFR calc Af Amer >90  >90 (mL/min)   LIPASE, BLOOD     Status: Abnormal   Collection Time   08/09/11  8:20 AM      Component Value Range Comment   Lipase 164 (*) 11 - 59 (U/L)    No results found.    Assessment Present on Admission:  .Nausea & vomiting .DEPRESSION, MAJOR, RECURRENT .MUNCHAUSEN'S SYNDROME .ABDOMINAL PAIN .SYMPTOM, PAIN, ABDOMINAL, RIGHT UP QUADRANT .PANCREATITIS, CHRONIC   PLAN: Will admit her for symptomatic treatment with antiemetic, very low amount of narcotic, and IV fluid. Please consult GI in the morning for further recommendations. Because of her diarrhea after antibiotic, I will send her stool for C. difficile PCR. She is stable, full code, and will be admitted to [facility].   Other plans as per orders.    Xaviera Flaten 08/10/2011, 1:28 AM

## 2011-08-10 NOTE — Progress Notes (Signed)
08/10/2011 [facility], Bosie Clos [facility] Case Management Note [phone]    CARE MANAGEMENT NOTE 08/10/2011  Patient:  ANAHLI, ARVANITIS   Account Number:  [id number]  Date Initiated:  08/10/2011  Documentation initiated by:  Fransico Michael  Subjective/Objective Assessment:   admitted on 08/09/11 with c/o nausea, vomitting, and diarrhea.     Action/Plan:   prior to admission, patient lived at home with family support. Independent with ADLs.   Anticipated DC Date:  08/13/2011   Anticipated DC Plan:  HOME/SELF CARE      DC Planning Services  CM consult      Choice offered to / List presented to:             Status of service:  In process, will continue to follow Medicare Important Message given?   (If response is "NO", the following Medicare IM given date fields will be blank) Date Medicare IM given:   Date Additional Medicare IM given:    Discharge Disposition:    Per UR Regulation:  Reviewed for med. necessity/level of care/duration of stay  Comments:  08/10/11-1312-J.Lutricia Horsfall  [phone]      30yo female admitted on 08/09/11 with c/o nausea, vomitting, and diarrhea. History of chronic pancreatitis noted. Prior to admission, patient lived at home with family support. Independent with ADLs. In to see patient. No discharge needs identified. CM will continue to monitor.

## 2011-08-10 NOTE — Progress Notes (Signed)
Subjective: Patient seen and examined this am. C/o persistent epigastric pain with nausea. No vomiting noted. informs symptoms of abdominal pain N/V ngoing for 1 month. Also having loose BMs  for ? Days.  Objective:  Vital signs in last 24 hours:  Filed Vitals:   08/10/11 0256 08/10/11 0507 08/10/11 0621 08/10/11 0946  BP: 131/86  104/66 108/63  Pulse: 74  82 49  Temp: 98.7 F (37.1 C)  97.6 F (36.4 C) 98.1 F (36.7 C)  TempSrc: Oral  Oral Oral  Resp: 18  19 17   Height:  5\' 5"  (1.651 m)    Weight: 72.9 kg (160 lb 11.5 oz)     SpO2: 96%  100% 100%    Intake/Output from previous day:   Intake/Output Summary (Last 24 hours) at 08/10/11 1329 Last data filed at 08/10/11 0947  Gross per 24 hour  Intake 386.67 ml  Output      0 ml  Net 386.67 ml    Physical Exam:  General: young female lying in bed in NAD HEENT: no pallor, no icterus, moist oral mucosa, no JVD, no lymphadenopathy Heart: Normal  s1 &s2  Regular rate and rhythm, without murmurs, rubs, gallops. Lungs: Clear to auscultation bilaterally. Abdomen: Soft, epigastric tenderness+,  nondistended, positive bowel sounds. Extremities: No clubbing cyanosis or edema with positive pedal pulses. Neuro: Alert, awake, oriented x3, nonfocal.   Lab Results:  Basic Metabolic Panel:    Component Value Date/Time   NA 140 08/09/2011 0820   K 3.9 08/09/2011 0820   CL 107 08/09/2011 0820   CO2 25 08/09/2011 0820   BUN 17 08/09/2011 0820   CREATININE 0.67 08/10/2011 0228   GLUCOSE 92 08/09/2011 0820   CALCIUM 9.4 08/09/2011 0820   CBC:    Component Value Date/Time   WBC 7.8 08/10/2011 0228   HGB 11.5* 08/10/2011 0228   HCT 34.3* 08/10/2011 0228   PLT 257 08/10/2011 0228   MCV 81.7 08/10/2011 0228   NEUTROABS 6.4 07/24/2011 2155   LYMPHSABS 3.2 07/24/2011 2155   MONOABS 0.8 07/24/2011 2155   EOSABS 0.5 07/24/2011 2155   BASOSABS 0.1 07/24/2011 2155    Recent Results (from the past 240 hour(s))  MRSA PCR SCREENING     Status: Normal   Collection Time   08/10/11  2:39 AM      Component Value Range Status Comment   MRSA by PCR NEGATIVE  NEGATIVE  Final     Studies/Results: No results found.  Medications: Scheduled Meds:   . sodium chloride   Intravenous STAT  . enoxaparin  40 mg Subcutaneous Q24H  . estradiol  2 mg Oral QHS  .  HYDROmorphone (DILAUDID) injection  0.5 mg Intravenous Once  . ondansetron (ZOFRAN) IV  4 mg Intravenous Once  . ondansetron  8 mg Oral Once  . oxyCODONE-acetaminophen  1 tablet Oral Once  . sodium chloride  1,000 mL Intravenous Once  . DISCONTD: cephALEXin  500 mg Oral QID  . DISCONTD: pantoprazole (PROTONIX) IV  40 mg Intravenous Once   Continuous Infusions:   . dextrose 5 % and 0.9 % NaCl with KCl 40 mEq/L 100 mL/hr at 08/10/11 0259   PRN Meds:.diphenhydrAMINE, diphenhydrAMINE, HYDROmorphone, ondansetron (ZOFRAN) IV, ondansetron, DISCONTD:  HYDROmorphone (DILAUDID) injection, DISCONTD: HYDROmorphone, DISCONTD: ondansetron (ZOFRAN) IV  Assessment/  31 y.o. 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  Plan: Recurrent Nausea  and vomiting with epigastric pain in the setting or recurrent pancreatitis  mildly elevated lipase of 162  will monitor with clears for now  cont pain control with low dose dilaudid prn and antiemetics patet was followed by [facility] previously. On reviewing records she had MRI abd done in June 12 which was concerning fro pancreatic divisum. Called [facility] but was informed that she has been fired from the practice due to non compliance Have requested Dr Elnoria Howard to evaluate Monitor lipase in am Patient does have hx of munchusen's syndrome and possibly has pain out of proportion stoll for c diff ordered given diarrhea on recent abx Cont IV fluids   Diet: clears  DVT prophylaxis  Full  code   LOS: 1 day   [name] 08/10/2011, 1:29 PM

## 2011-08-10 NOTE — ED Provider Notes (Signed)
Medical screening examination/treatment/procedure(s) were conducted as a shared visit with non-physician practitioner(s) and myself.  I personally evaluated the patient during the encounter    Mikayla Borgeson R Nataline Basara, MD 08/10/11 1102 

## 2011-08-10 NOTE — Progress Notes (Signed)
Pt arrived to the floor via wheelchair accompanied by ED staff. Transferred to the bed and admission assessment and history completed. Bed lowered to lowest position, wheels locked, call bell and phone within reach. Pt want to talk to doctor about increasing pain medication and getting Benadryl. Called Maren Reamer NP and no new orders.

## 2011-08-11 MED ORDER — HYDROMORPHONE HCL PF 1 MG/ML IJ SOLN
1.0000 mg | INTRAMUSCULAR | Status: DC | PRN
  Administered 2011-08-11 (×3): 1 mg via INTRAVENOUS
  Filled 2011-08-11 (×3): qty 1

## 2011-08-11 MED ORDER — DIPHENHYDRAMINE HCL 25 MG PO CAPS: 25.0000 mg | ORAL_CAPSULE | Freq: Four times a day (QID) | ORAL | Status: DC | PRN

## 2011-08-11 MED ORDER — HYDROMORPHONE HCL PF 1 MG/ML IJ SOLN
0.5000 mg | INTRAMUSCULAR | Status: DC | PRN
  Administered 2011-08-11: 0.5 mg via INTRAVENOUS
  Filled 2011-08-11: qty 1

## 2011-08-11 NOTE — Progress Notes (Signed)
Patient ID: [name], female   DOB: 12-21-1980, 31 y.o.   MRN: [id number] Subjective: Complaining of abdominal pain and requesting an increase in her pain meds.  Objective: Vital signs in last 24 hours: Temp:  [97.5 F (36.4 C)-98.1 F (36.7 C)] 97.5 F (36.4 C) (02/06 0521) Pulse Rate:  [49-77] 77  (02/06 0521) Resp:  [17-18] 18  (02/06 0521) BP: (97-110)/(55-84) 97/84 mmHg (02/06 0521) SpO2:  [95 %-100 %] 100 % (02/06 0521) Weight:  [73.9 kg (162 lb 14.7 oz)] 73.9 kg (162 lb 14.7 oz) (02/05 2220) Last BM Date: 08/10/11  Intake/Output from previous day: 02/05 0701 - 02/06 0700 In: 2505 [P.O.:90; I.V.:2415] Out: 1600 [Urine:1600] Intake/Output this shift:    General appearance: alert and mild distress GI: tender in the epigastrium  Lab Results:  Basename 08/10/11 0228 08/09/11 0820  WBC 7.8 7.2  HGB 11.5* 12.3  HCT 34.3* 36.9  PLT 257 261   BMET  Basename 08/10/11 0228 08/09/11 0820  NA -- 140  K -- 3.9  CL -- 107  CO2 -- 25  GLUCOSE -- 92  BUN -- 17  CREATININE 0.67 0.63  CALCIUM -- 9.4   LFT  Basename 08/09/11 0820  PROT 7.7  ALBUMIN 3.6  AST 16  ALT 9  ALKPHOS 61  BILITOT 0.2*  BILIDIR --  IBILI --   PT/INR No results found for this basename: LABPROT:2,INR:2 in the last 72 hours Hepatitis Panel No results found for this basename: HEPBSAG,HCVAB,HEPAIGM,HEPBIGM in the last 72 hours C-Diff No results found for this basename: CDIFFTOX:3 in the last 72 hours Fecal Lactopherrin No results found for this basename: FECLLACTOFRN in the last 72 hours  Studies/Results: No results found.  Medications:  Scheduled:   . sodium chloride   Intravenous STAT  . enoxaparin  40 mg Subcutaneous Q24H  . estradiol  2 mg Oral QHS  . DISCONTD: cephALEXin  500 mg Oral QID   Continuous:   . dextrose 5 % and 0.9 % NaCl with KCl 40 mEq/L 100 mL/hr at 08/10/11 1415    Assessment/Plan: 1) ABM pain - ? Etiology.   I will attempt to obtain some records from  [facility] today.  Plan: 1) Increase in dilaudid frequency. 2) Continue with supportive care.  LOS: 2 days   [name] D 08/11/2011, 8:01 AM

## 2011-08-11 NOTE — Progress Notes (Signed)
Utilization review completed.  

## 2011-08-11 NOTE — Progress Notes (Signed)
Subjective: Chart reviewed. Patient complains of 8/10 pain currently but is sitting up in drinking liquids out of a couple and looks quite comfortable and smiling. She is asking for some subway sandwich to eat.  Objective: Blood pressure 117/81, pulse 77, temperature 98 F (36.7 C), temperature source Oral, resp. rate 18, height 5\' 5"  (1.651 m), weight 73.9 kg (162 lb 14.7 oz), last menstrual period 06/01/2011, SpO2 98.00%.  Intake/Output Summary (Last 24 hours) at 08/11/11 1734 Last data filed at 08/11/11 1700  Gross per 24 hour  Intake   2095 ml  Output   1600 ml  Net    495 ml    General Exam: Comfortable. Respiratory System: Clear. No increased work of breathing. Cardiovascular System: First and second heart sounds heard. Regular rate and rhythm. No JVD/murmurs. Gastrointestinal System: Abdomen is non distended, soft and normal bowel sounds heard.?? Mild epigastric tenderness but inconsistent and seems out of proportion(less) with patient stated pain. Central Nervous System: Alert and oriented. No focal neurological deficits.  Lab Results: Basic Metabolic Panel:  Basename 08/10/11 0228 08/09/11 0820  NA -- 140  K -- 3.9  CL -- 107  CO2 -- 25  GLUCOSE -- 92  BUN -- 17  CREATININE 0.67 0.63  CALCIUM -- 9.4  MG -- --  PHOS -- --   Liver Function Tests:  Basename 08/09/11 0820  AST 16  ALT 9  ALKPHOS 61  BILITOT 0.2*  PROT 7.7  ALBUMIN 3.6    Basename 08/11/11 0620 08/09/11 0820  LIPASE 154* 164*  AMYLASE -- --   No results found for this basename: AMMONIA:2 in the last 72 hours CBC:  Basename 08/10/11 0228 08/09/11 0820  WBC 7.8 7.2  NEUTROABS -- --  HGB 11.5* 12.3  HCT 34.3* 36.9  MCV 81.7 81.8  PLT 257 261   Cardiac Enzymes: No results found for this basename: CKTOTAL:3,CKMB:3,CKMBINDEX:3,TROPONINI:3 in the last 72 hours BNP: No results found for this basename: PROBNP:3 in the last 72 hours D-Dimer: No results found for this basename: DDIMER:2 in  the last 72 hours CBG: No results found for this basename: GLUCAP:6 in the last 72 hours Hemoglobin A1C: No results found for this basename: HGBA1C in the last 72 hours Fasting Lipid Panel: No results found for this basename: CHOL,HDL,LDLCALC,TRIG,CHOLHDL,LDLDIRECT in the last 72 hours Thyroid Function Tests: No results found for this basename: TSH,T4TOTAL,FREET4,T3FREE,THYROIDAB in the last 72 hours Anemia Panel: No results found for this basename: VITAMINB12,FOLATE,FERRITIN,TIBC,IRON,RETICCTPCT in the last 72 hours Coagulation: No results found for this basename: LABPROT:2,INR:2 in the last 72 hours Urine Drug Screen: Drugs of Abuse     Component Value Date/Time   LABOPIA NEGATIVE 04/28/2011 1538   LABOPIA NONE DETECTED 03/17/2011 1727   COCAINSCRNUR NEGATIVE 04/28/2011 1538   COCAINSCRNUR NONE DETECTED 03/17/2011 1727   LABBENZ NEGATIVE 04/28/2011 1538   LABBENZ NONE DETECTED 03/17/2011 1727   AMPHETMU NEGATIVE 04/28/2011 1538   AMPHETMU NONE DETECTED 03/17/2011 1727   THCU POSITIVE* 03/17/2011 1727   LABBARB NONE DETECTED 03/17/2011 1727    Alcohol Level: No results found for this basename: ETH:2 in the last 72 hours Urinalysis:  Basename 08/09/11 0803  COLORURINE YELLOW  LABSPEC 1.034*  PHURINE 5.5  GLUCOSEU NEGATIVE  HGBUR NEGATIVE  BILIRUBINUR NEGATIVE  KETONESUR NEGATIVE  PROTEINUR NEGATIVE  UROBILINOGEN 1.0  NITRITE NEGATIVE  LEUKOCYTESUR NEGATIVE     Micro Results: Recent Results (from the past 240 hour(s))  MRSA PCR SCREENING     Status: Normal   Collection  Time   08/10/11  2:39 AM      Component Value Range Status Comment   MRSA by PCR NEGATIVE  NEGATIVE  Final     Studies/Results: No results found.  Medications: Scheduled Meds:    . sodium chloride   Intravenous STAT  . enoxaparin  40 mg Subcutaneous Q24H  . estradiol  2 mg Oral QHS   Continuous Infusions:    . DISCONTD: dextrose 5 % and 0.9 % NaCl with KCl 40 mEq/L 100 mL/hr at 08/11/11 1651    PRN Meds:.artificial tears, diphenhydrAMINE, HYDROmorphone, ondansetron (ZOFRAN) IV, ondansetron, DISCONTD: diphenhydrAMINE, DISCONTD: diphenhydrAMINE, DISCONTD: HYDROmorphone, DISCONTD: HYDROmorphone  Assessment/Plan: 1. Abdominal Pain: unclear etiology. Discussed with Dr. Elnoria Howard who reviewed patients GI workup with [facility]. She had ERCP which was negative, EUS with celiac plexus block. He recommends advancing diet as tolerated and no further workup. He does not believe that she has pancreatitis. ? opoid seeking behavior. Will advance diet as tolerated and reduce opiods. Discussed with patient and she agrees. 2. Anemia: outpatient follow up. 3. Disposition: possible D/C tomorrow.    HONGALGI,ANAND 08/11/2011, 5:34 PM

## 2011-08-11 NOTE — Significant Event (Signed)
RN called NP because patient is packing to leave and has a friend here to pick her up. NP to bedside. Pt states she is leaving because she can go home and "hurt" instead of staying here and hurting. She is upset that "all her meds" were taken away and she was told she could just take Tylenol. When actually, her diet was advanced today and narcotics were decreased in dose and frequency, but not discontinued. Benadryl was changed to po from IV but not discontinued. I explained all of this to her and tried to reason with her and encourage her to stay til a.m. and make sure she is tolerating her diet. I also explained that advice is for her to stay for her well being and safety and hopefully she can go home in a.m. She continues to be adamant about leaving no matter what this NP says. Pt signed AMA form and is leaving without formal discharge. (Please see today's primary dr note).  Maren Reamer, NP [facility]

## 2011-08-20 ENCOUNTER — Encounter (HOSPITAL_COMMUNITY): Payer: Self-pay | Admitting: *Deleted

## 2011-08-20 ENCOUNTER — Emergency Department (HOSPITAL_COMMUNITY)
Admission: EM | Admit: 2011-08-20 | Discharge: 2011-08-21 | Disposition: A | Discharge status: Home / Self Care | Payer: Medicaid Other | Attending: Emergency Medicine | Admitting: Emergency Medicine

## 2011-08-20 DIAGNOSIS — R112 Nausea with vomiting, unspecified: Secondary | ICD-10-CM | POA: Insufficient documentation

## 2011-08-20 DIAGNOSIS — R10816 Epigastric abdominal tenderness: Secondary | ICD-10-CM | POA: Insufficient documentation

## 2011-08-20 DIAGNOSIS — R1013 Epigastric pain: Secondary | ICD-10-CM

## 2011-08-20 DIAGNOSIS — R110 Nausea: Secondary | ICD-10-CM

## 2011-08-20 DIAGNOSIS — F17200 Nicotine dependence, unspecified, uncomplicated: Secondary | ICD-10-CM | POA: Insufficient documentation

## 2011-08-20 DIAGNOSIS — R197 Diarrhea, unspecified: Secondary | ICD-10-CM | POA: Insufficient documentation

## 2011-08-20 MED ORDER — HYDROMORPHONE HCL PF 1 MG/ML IJ SOLN
1.0000 mg | Freq: Once | INTRAMUSCULAR | Status: AC
  Administered 2011-08-21: 1 mg via INTRAVENOUS
  Filled 2011-08-20: qty 1

## 2011-08-20 MED ORDER — SODIUM CHLORIDE 0.9 % IV BOLUS (SEPSIS)
1000.0000 mL | Freq: Once | INTRAVENOUS | Status: DC
Start: 2011-08-21 — End: 2011-08-21

## 2011-08-20 MED ORDER — ONDANSETRON HCL 4 MG/2ML IJ SOLN
4.0000 mg | Freq: Once | INTRAMUSCULAR | Status: AC
  Administered 2011-08-21: 4 mg via INTRAVENOUS
  Filled 2011-08-20: qty 2

## 2011-08-20 NOTE — ED Notes (Signed)
C/o abd pain, chronic, r/t "pancreatitis", also nvd, onset 4d ago, worse last night. Last emesis 2145, ~ 5x today "whenever i eat or drink anything", last BM PTA (diarrhea, ~ 20x today, soft/ kind of watery). Also chills, (Denies: fever).

## 2011-08-21 LAB — CBC
MCH: 28.3 pg (ref 26.0–34.0)
MCHC: 35 g/dL (ref 30.0–36.0)
MCV: 81 fL (ref 78.0–100.0)
Platelets: 285 10*3/uL (ref 150–400)
RBC: 4.31 MIL/uL (ref 3.87–5.11)

## 2011-08-21 LAB — COMPREHENSIVE METABOLIC PANEL
ALT: 20 U/L (ref 0–35)
Albumin: 3.7 g/dL (ref 3.5–5.2)
BUN: 17 mg/dL (ref 6–23)
Calcium: 9.4 mg/dL (ref 8.4–10.5)
GFR calc Af Amer: >90 mL/min (ref >90)
Glucose, Bld: 84 mg/dL (ref 70–99)
Sodium: 139 mEq/L (ref 135–145)
Total Protein: 7.4 g/dL (ref 6.0–8.3)

## 2011-08-21 LAB — DIFFERENTIAL
Basophils Relative: 1 % (ref 0–1)
Eosinophils Absolute: 0.7 10*3/uL (ref 0.0–0.7)
Eosinophils Relative: 7 % — ABNORMAL HIGH (ref 0–5)
Lymphs Abs: 3.4 10*3/uL (ref 0.7–4.0)

## 2011-08-21 LAB — LIPASE, BLOOD: Lipase: 175 U/L — ABNORMAL HIGH (ref 11–59)

## 2011-08-21 MED ORDER — DIPHENHYDRAMINE HCL 25 MG PO CAPS
25.0000 mg | ORAL_CAPSULE | Freq: Once | ORAL | Status: AC
Start: 2011-08-21 — End: 2011-08-21
  Administered 2011-08-21: 25 mg via ORAL
  Filled 2011-08-21: qty 1

## 2011-08-21 MED ORDER — OXYCODONE-ACETAMINOPHEN 5-325 MG PO TABS
1.0000 | ORAL_TABLET | Freq: Once | ORAL | Status: AC
  Administered 2011-08-21: 1 via ORAL
  Filled 2011-08-21: qty 1

## 2011-08-21 MED ORDER — DIPHENHYDRAMINE HCL 50 MG/ML IJ SOLN
25.0000 mg | Freq: Once | INTRAMUSCULAR | Status: AC
  Administered 2011-08-21: 25 mg via INTRAVENOUS
  Filled 2011-08-21: qty 1

## 2011-08-21 MED ORDER — DIPHENHYDRAMINE HCL 25 MG PO TABS: 25.0000 mg | ORAL_TABLET | Freq: Four times a day (QID) | ORAL | Status: DC

## 2011-08-21 MED ORDER — LORAZEPAM 2 MG/ML IJ SOLN
1.0000 mg | Freq: Once | INTRAMUSCULAR | Status: DC
  Filled 2011-08-21: qty 1

## 2011-08-21 MED ORDER — HYDROMORPHONE HCL PF 1 MG/ML IJ SOLN
1.0000 mg | Freq: Once | INTRAMUSCULAR | Status: AC
  Administered 2011-08-21: 1 mg via INTRAVENOUS
  Filled 2011-08-21: qty 1

## 2011-08-21 MED ORDER — OXYCODONE-ACETAMINOPHEN 5-325 MG PO TABS: 1.0000 | ORAL_TABLET | ORAL | Status: DC | PRN

## 2011-08-21 MED ORDER — ONDANSETRON HCL 4 MG PO TABS: 4.0000 mg | ORAL_TABLET | Freq: Four times a day (QID) | ORAL | Status: DC

## 2011-08-21 NOTE — ED Notes (Signed)
Patient reports chronic abdominal pain 9/10 with associated nausea.

## 2011-08-21 NOTE — ED Notes (Signed)
PO challenge , patent keeping down gingerale and ice chips with no vomiting.

## 2011-08-21 NOTE — ED Provider Notes (Addendum)
History     CSN: [id number]  Arrival date & time 08/20/11  2136   First MD Initiated Contact with Patient 08/20/11 2315      Chief Complaint  Patient presents with  . Abdominal Pain    (Consider location/radiation/quality/duration/timing/severity/associated sxs/prior treatment) HPI Comments: Patient presents with a questionable history of pancreatitis in the past with complaints of epigastric abdominal pain with associated nausea and vomiting.  She states is been going on for approximately the last 3 days.  She also states that it is similar to when she was admitted last month for a question of pancreatitis with similar symptoms of abdominal pain and nausea.  Per medical record review it appears that the patient did have an ERCP which did not demonstrate any significant abnormalities and she had a celiac plexus nerve block.  Patient states that she has been using her Zofran at home for her nausea but has no pain medications and because the pain was getting worse and she's unable to keep food down she is presented to the ER.  Her pain is worse with eating.  Pain does not radiate   Patient is a 31 y.o. female presenting with abdominal pain. The history is provided by the patient.  Abdominal Pain The primary symptoms of the illness include abdominal pain, nausea, vomiting and diarrhea. The primary symptoms of the illness do not include fever, fatigue, shortness of breath, hematemesis, hematochezia, dysuria, vaginal discharge or vaginal bleeding. The current episode started more than 2 days ago. The onset of the illness was gradual. The problem has been gradually worsening.  Symptoms associated with the illness do not include chills or back pain.    Past Medical History  Diagnosis Date  . Pancreatitis   . S/P laparoscopic cholecystectomy   . Fibroids   . Ovarian cyst   . Chronic abdominal pain     Past Surgical History  Procedure Date  . Cholecystectomy   . Abdominal hysterectomy   .  Dilation and curettage of uterus     Family History  Problem Relation Age of Onset  . Diabetes Mother   . Hypertension Mother   . Hypertension Father   . Diabetes Father   . Asthma Daughter     History  Substance Use Topics  . Smoking status: Current Everyday Smoker -- 0.5 packs/day for 10 years    Types: Cigarettes  . Smokeless tobacco: Never Used   Comment: declined  . Alcohol Use: No    OB History    Grav Para Term Preterm Abortions TAB SAB Ect Mult Living   4 2 1 1 2  2          Review of Systems  Constitutional: Negative.  Negative for fever, chills and fatigue.  HENT: Negative.   Eyes: Negative.  Negative for discharge and redness.  Respiratory: Negative.  Negative for cough and shortness of breath.   Cardiovascular: Negative.  Negative for chest pain.  Gastrointestinal: Positive for nausea, vomiting, abdominal pain and diarrhea. Negative for hematochezia and hematemesis.  Genitourinary: Negative.  Negative for dysuria, vaginal bleeding and vaginal discharge.  Musculoskeletal: Negative.  Negative for back pain.  Skin: Negative.  Negative for color change and rash.  Neurological: Negative.  Negative for syncope and headaches.  Hematological: Negative.  Negative for adenopathy.  Psychiatric/Behavioral: Negative.  Negative for confusion.  All other systems reviewed and are negative.    Allergies  Celecoxib; Ketorolac tromethamine; Meperidine hcl; Ibuprofen; Morphine; Propoxyphene n-acetaminophen; and Tramadol hcl  Home Medications  Current Outpatient Rx  Name Route Sig Dispense Refill  . ESTRADIOL 2 MG PO TABS Oral Take 1 tablet (2 mg total) by mouth at bedtime. 30 tablet 3    BP 116/65  Pulse 61  Temp(Src) 98.2 F (36.8 C) (Oral)  Resp 18  SpO2 94%  LMP 06/01/2011  Physical Exam  Nursing note and vitals reviewed. Constitutional: She is oriented to person, place, and time. She appears well-developed and well-nourished.  Non-toxic appearance. She does  not have a sickly appearance.  HENT:  Head: Normocephalic and atraumatic.  Eyes: Conjunctivae, EOM and lids are normal. Pupils are equal, round, and reactive to light. No scleral icterus.  Neck: Trachea normal and normal range of motion. Neck supple.  Cardiovascular: Normal rate, regular rhythm and normal heart sounds.   Pulmonary/Chest: Effort normal and breath sounds normal. No respiratory distress. She has no wheezes. She has no rales.  Abdominal: Soft. Normal appearance. There is tenderness. There is no rebound, no guarding and no CVA tenderness.       No rebound or guarding is present on exam the patient complains of epigastric tenderness on palpation  Musculoskeletal: Normal range of motion.  Neurological: She is alert and oriented to person, place, and time. She has normal strength.  Skin: Skin is warm, dry and intact. No rash noted.  Psychiatric: She has a normal mood and affect. Her behavior is normal. Judgment and thought content normal.    ED Course  Procedures (including critical care time)  Results for orders placed during the hospital encounter of 08/20/11  CBC      Component Value Range   WBC 9.8  4.0 - 10.5 (K/uL)   RBC 4.31  3.87 - 5.11 (MIL/uL)   Hemoglobin 12.2  12.0 - 15.0 (g/dL)   HCT 95.2 (*) 84.1 - 46.0 (%)   MCV 81.0  78.0 - 100.0 (fL)   MCH 28.3  26.0 - 34.0 (pg)   MCHC 35.0  30.0 - 36.0 (g/dL)   RDW 32.4  40.1 - 02.7 (%)   Platelets 285  150 - 400 (K/uL)  DIFFERENTIAL      Component Value Range   Neutrophils Relative 50  43 - 77 (%)   Neutro Abs 4.9  1.7 - 7.7 (K/uL)   Lymphocytes Relative 35  12 - 46 (%)   Lymphs Abs 3.4  0.7 - 4.0 (K/uL)   Monocytes Relative 8  3 - 12 (%)   Monocytes Absolute 0.7  0.1 - 1.0 (K/uL)   Eosinophils Relative 7 (*) 0 - 5 (%)   Eosinophils Absolute 0.7  0.0 - 0.7 (K/uL)   Basophils Relative 1  0 - 1 (%)   Basophils Absolute 0.1  0.0 - 0.1 (K/uL)  COMPREHENSIVE METABOLIC PANEL      Component Value Range   Sodium 139   135 - 145 (mEq/L)   Potassium 4.5  3.5 - 5.1 (mEq/L)   Chloride 106  96 - 112 (mEq/L)   CO2 24  19 - 32 (mEq/L)   Glucose, Bld 84  70 - 99 (mg/dL)   BUN 17  6 - 23 (mg/dL)   Creatinine, Ser 2.53  0.50 - 1.10 (mg/dL)   Calcium 9.4  8.4 - 66.4 (mg/dL)   Total Protein 7.4  6.0 - 8.3 (g/dL)   Albumin 3.7  3.5 - 5.2 (g/dL)   AST 21  0 - 37 (U/L)   ALT 20  0 - 35 (U/L)   Alkaline Phosphatase 60  39 -  117 (U/L)   Total Bilirubin 0.1 (*) 0.3 - 1.2 (mg/dL)   GFR calc non Af Amer >90  >90 (mL/min)   GFR calc Af Amer >90  >90 (mL/min)  LIPASE, BLOOD      Component Value Range   Lipase 175 (*) 11 - 59 (U/L)        MDM  Patient with a lipase that is mildly elevated but no other significant laboratory abnormalities.  As patient has recently had a significant workup for this I do not feel patient warrants further ultrasounds or CAT scans at this time.  My primary goal at this time is to control the patient's pain and nausea to see if she'll be able to tolerate by mouth and potentially go home with continued followup as an outpatient with the GI service.  Patient is secondary complaint is some itching and she did receive Benadryl for that as well.        Nat Christen, MD 08/21/11 0119  Patient has a relatively benign exam and is passed a by mouth challenge here I believe she can be safely discharged home with prescriptions for pain and nausea medicines to be used at home with continued followup with the GI service as an outpatient.  Nat Christen, MD 08/21/11 [phone]

## 2011-08-21 NOTE — Discharge Instructions (Signed)

## 2011-08-24 ENCOUNTER — Encounter (INDEPENDENT_AMBULATORY_CARE_PROVIDER_SITE_OTHER): Payer: Self-pay | Admitting: General Surgery

## 2011-08-24 NOTE — Discharge Summary (Signed)
Discharge Summary: Patient left Against Medical Advise.  [name] MR#: [id number]  DOB:19-Apr-1981  Date of Admission: 08/09/2011 Date of Discharge: 08/11/2011  Patient's PCP: No primary provider on file.  Attending Physician:Odean Mcelwain  Consults: Gastroenterology: Dr. Jeani Hawking.  Discharge Diagnoses: Principal Problem:  *Nausea & vomiting & Abdominal Pain Active Problems:  DEPRESSION, MAJOR, RECURRENT  MUNCHAUSEN'S SYNDROME  PANCREATITIS, CHRONIC  ABDOMINAL PAIN  SYMPTOM, PAIN, ABDOMINAL, RIGHT UP QUADRANT  CHOLECYSTECTOMY, HX OF Anemia  Brief Admitting History and Physical 31 year old female with history of recurrent episodes of abdominal pain, reported chronic pancreatitis, Munchausen's syndrome and major depression by records who had GI evaluation in past, was admitted because of nausea, vomiting, abdominal pain and diarrhea. She had mildly elevated Lipase.     Hospital Course: Patient was admitted to the hospital. Her bowels were rested and she was treated with IV hydration, pain management and antiemetics. GI was consulted. Dr Elnoria Howard kindly saw the patient and reviewed her records from [facility]. He indicated that patient had prior ERCP which was negative and EUS with celiac plexus block. He did not recommend any further evaluation. She seemed to has subjective pain which was out of proportion to the physical exam. We started to advance her diet and taper down her pain medications. She seemed to be dissatisfied and signed out AMA at night, prior to being seen by the MD and formally discharged despite being advised to stay.  Lab Data: 1. CMP 08/09/11: unremarkable. 2. Lipase: 164 3. CBC significant for hemoglobin 11.5 4. Urine pregnancy test: negative. 5. Urine analysis: not suggestive of UTI    Signed: Cyniah Gossard, MD 08/24/2011, 10:41 PM

## 2011-08-28 ENCOUNTER — Encounter (HOSPITAL_COMMUNITY): Payer: Self-pay | Admitting: *Deleted

## 2011-08-28 ENCOUNTER — Emergency Department (HOSPITAL_COMMUNITY)
Admission: EM | Admit: 2011-08-28 | Discharge: 2011-08-28 | Disposition: A | Discharge status: Home / Self Care | Payer: Medicaid Other | Attending: Emergency Medicine | Admitting: Emergency Medicine

## 2011-08-28 DIAGNOSIS — R1012 Left upper quadrant pain: Secondary | ICD-10-CM | POA: Insufficient documentation

## 2011-08-28 DIAGNOSIS — R112 Nausea with vomiting, unspecified: Secondary | ICD-10-CM | POA: Insufficient documentation

## 2011-08-28 DIAGNOSIS — G8929 Other chronic pain: Secondary | ICD-10-CM

## 2011-08-28 DIAGNOSIS — R1013 Epigastric pain: Secondary | ICD-10-CM | POA: Insufficient documentation

## 2011-08-28 DIAGNOSIS — R51 Headache: Secondary | ICD-10-CM | POA: Insufficient documentation

## 2011-08-28 LAB — COMPREHENSIVE METABOLIC PANEL
ALT: 16 U/L (ref 0–35)
AST: 27 U/L (ref 0–37)
Albumin: 3.4 g/dL — ABNORMAL LOW (ref 3.5–5.2)
Calcium: 9 mg/dL (ref 8.4–10.5)
Creatinine, Ser: 0.62 mg/dL (ref 0.50–1.10)
Sodium: 139 mEq/L (ref 135–145)

## 2011-08-28 LAB — CBC
HCT: 36 % (ref 36.0–46.0)
MCHC: 33.6 g/dL (ref 30.0–36.0)
MCV: 83.1 fL (ref 78.0–100.0)
Platelets: 270 10*3/uL (ref 150–400)
RDW: 12.3 % (ref 11.5–15.5)
WBC: 8.7 10*3/uL (ref 4.0–10.5)

## 2011-08-28 LAB — DIFFERENTIAL
Basophils Absolute: 0.1 10*3/uL (ref 0.0–0.1)
Basophils Relative: 1 % (ref 0–1)
Eosinophils Relative: 6 % — ABNORMAL HIGH (ref 0–5)
Lymphocytes Relative: 30 % (ref 12–46)
Monocytes Absolute: 0.8 10*3/uL (ref 0.1–1.0)
Neutro Abs: 4.8 10*3/uL (ref 1.7–7.7)

## 2011-08-28 MED ORDER — HYDROMORPHONE HCL PF 1 MG/ML IJ SOLN
1.0000 mg | Freq: Once | INTRAMUSCULAR | Status: AC
  Administered 2011-08-28: 1 mg via INTRAVENOUS
  Filled 2011-08-28: qty 1

## 2011-08-28 MED ORDER — ONDANSETRON HCL 4 MG/2ML IJ SOLN
4.0000 mg | Freq: Once | INTRAMUSCULAR | Status: AC
  Administered 2011-08-28: 4 mg via INTRAVENOUS
  Filled 2011-08-28: qty 2

## 2011-08-28 MED ORDER — DIPHENHYDRAMINE HCL 25 MG PO CAPS
50.0000 mg | ORAL_CAPSULE | Freq: Once | ORAL | Status: DC
  Filled 2011-08-28: qty 2

## 2011-08-28 MED ORDER — DIPHENHYDRAMINE HCL 25 MG PO CAPS: 50.0000 mg | ORAL_CAPSULE | Freq: Once | ORAL | Status: DC

## 2011-08-28 MED ORDER — SODIUM CHLORIDE 0.9 % IV BOLUS (SEPSIS)
1000.0000 mL | Freq: Once | INTRAVENOUS | Status: AC
  Administered 2011-08-28: 1000 mL via INTRAVENOUS

## 2011-08-28 NOTE — Discharge Instructions (Signed)
Abdominal Pain, Women °Abdominal (stomach, pelvic, or belly) pain can be caused by many things. It is important to tell your doctor: °· The location of the pain.  °· Does it come and go or is it present all the time?  °· Are there things that start the pain (eating certain foods, exercise)?  °· Are there other symptoms associated with the pain (fever, nausea, vomiting, diarrhea)?  °All of this is helpful to know when trying to find the cause of the pain. °CAUSES  °· Stomach: virus or bacteria infection, or ulcer.  °· Intestine: appendicitis (inflamed appendix), regional ileitis (Crohn's disease), ulcerative colitis (inflamed colon), irritable bowel syndrome, diverticulitis (inflamed diverticulum of the colon), or cancer of the stomach or intestine.  °· Gallbladder disease or stones in the gallbladder.  °· Kidney disease, kidney stones, or infection.  °· Pancreas infection or cancer.  °· Fibromyalgia (pain disorder).  °· Diseases of the female organs:  °· Uterus: fibroid (non-cancerous) tumors or infection.  °· Fallopian tubes: infection or tubal pregnancy.  °· Ovary: cysts or tumors.  °· Pelvic adhesions (scar tissue).  °· Endometriosis (uterus lining tissue growing in the pelvis and on the pelvic organs).  °· Pelvic congestion syndrome (female organs filling up with blood just before the menstrual period).  °· Pain with the menstrual period.  °· Pain with ovulation (producing an egg).  °· Pain with an IUD (intrauterine device, birth control) in the uterus.  °· Cancer of the female organs.  °· Functional pain (pain not caused by a disease, may improve without treatment).  °· Psychological pain.  °· Depression.  °DIAGNOSIS  °Your doctor will decide the seriousness of your pain by doing an examination. °· Blood tests.  °· X-rays.  °· Ultrasound.  °· CT scan (computed tomography, special type of X-ray).  °· MRI (magnetic resonance imaging).  °· Cultures, for infection.  °· Barium enema (dye inserted in the large  intestine, to better view it with X-rays).  °· Colonoscopy (looking in intestine with a lighted tube).  °· Laparoscopy (minor surgery, looking in abdomen with a lighted tube).  °· Major abdominal exploratory surgery (looking in abdomen with a large incision).  °TREATMENT  °The treatment will depend on the cause of the pain.  °· Many cases can be observed and treated at home.  °· Over-the-counter medicines recommended by your caregiver.  °· Prescription medicine.  °· Antibiotics, for infection.  °· Birth control pills, for painful periods or for ovulation pain.  °· Hormone treatment, for endometriosis.  °· Nerve blocking injections.  °· Physical therapy.  °· Antidepressants.  °· Counseling with a psychologist or psychiatrist.  °· Minor or major surgery.  °HOME CARE INSTRUCTIONS  °· Do not take laxatives, unless directed by your caregiver.  °· Take over-the-counter pain medicine only if ordered by your caregiver. Do not take aspirin because it can cause an upset stomach or bleeding.  °· Try a clear liquid diet (broth or water) as ordered by your caregiver. Slowly move to a bland diet, as tolerated, if the pain is related to the stomach or intestine.  °· Have a thermometer and take your temperature several times a day, and record it.  °· Bed rest and sleep, if it helps the pain.  °· Avoid sexual intercourse, if it causes pain.  °· Avoid stressful situations.  °· Keep your follow-up appointments and tests, as your caregiver orders.  °· If the pain does not go away with medicine or surgery, you may   try:  °· Acupuncture.  °· Relaxation exercises (yoga, meditation).  °· Group therapy.  °· Counseling.  °SEEK MEDICAL CARE IF:  °· You notice certain foods cause stomach pain.  °· Your home care treatment is not helping your pain.  °· You need stronger pain medicine.  °· You want your IUD removed.  °· You feel faint or lightheaded.  °· You develop nausea and vomiting.  °· You develop a rash.  °· You are having side effects or  an allergy to your medicine.  °SEEK IMMEDIATE MEDICAL CARE IF:  °· Your pain does not go away or gets worse.  °· You have a fever.  °· Your pain is felt only in portions of the abdomen. The right side could possibly be appendicitis. The left lower portion of the abdomen could be colitis or diverticulitis.  °· You are passing blood in your stools (bright red or black tarry stools, with or without vomiting).  °· You have blood in your urine.  °· You develop chills, with or without a fever.  °· You pass out.  °MAKE SURE YOU:  °· Understand these instructions.  °· Will watch your condition.  °· Will get help right away if you are not doing well or get worse.  °Document Released: 04/18/2007 Document Revised: 03/03/2011 Document Reviewed: 05/08/2009 °[employer]® Patient Information ©2012 [employer]. °

## 2011-08-28 NOTE — ED Notes (Signed)
Pt refused oral Benadryl, states the medicine will only make her stomach hurt worse.

## 2011-08-28 NOTE — ED Provider Notes (Signed)
History     CSN: [id number]  Arrival date & time 08/28/11  1725   First MD Initiated Contact with Patient 08/28/11 2018      Chief Complaint  Patient presents with  . Abdominal Pain  . Headache    (Consider location/radiation/quality/duration/timing/severity/associated sxs/prior treatment) HPI Comments: Patient here with worsening epigastric and LUQ abdominal pain - states that she thinks her pancreatitis is acting up again - reports nausea and vomiting - states that every time she eats that she has pain and begins to vomit - last admission 1 month ago - last seen in the ED 1 week ago - reports pain has been going on for 4 weeks.  Patient is a 31 y.o. female presenting with abdominal pain and headaches. The history is provided by the patient. No language interpreter was used.  Abdominal Pain The primary symptoms of the illness include abdominal pain, nausea and vomiting. The primary symptoms of the illness do not include fever, fatigue, shortness of breath, diarrhea, hematemesis, hematochezia, dysuria, vaginal discharge or vaginal bleeding. The current episode started more than 2 days ago. The onset of the illness was gradual. The problem has not changed since onset. The illness is associated with eating. The patient states that she believes she is currently not pregnant. The patient has not had a change in bowel habit. Additional symptoms associated with the illness include anorexia. Symptoms associated with the illness do not include chills, diaphoresis, heartburn, constipation, urgency, hematuria, frequency or back pain. Significant associated medical issues include substance abuse.  Headache  Associated symptoms include anorexia, nausea and vomiting. Pertinent negatives include no fever and no shortness of breath.    Past Medical History  Diagnosis Date  . Pancreatitis   . S/P laparoscopic cholecystectomy   . Fibroids   . Ovarian cyst   . Chronic abdominal pain     Past  Surgical History  Procedure Date  . Cholecystectomy   . Abdominal hysterectomy   . Dilation and curettage of uterus     Family History  Problem Relation Age of Onset  . Diabetes Mother   . Hypertension Mother   . Hypertension Father   . Diabetes Father   . Asthma Daughter     History  Substance Use Topics  . Smoking status: Current Everyday Smoker -- 0.5 packs/day for 10 years    Types: Cigarettes  . Smokeless tobacco: Never Used   Comment: declined  . Alcohol Use: No    OB History    Grav Para Term Preterm Abortions TAB SAB Ect Mult Living   4 2 1 1 2  2          Review of Systems  Constitutional: Negative for fever, chills, diaphoresis and fatigue.  Respiratory: Negative for shortness of breath.   Gastrointestinal: Positive for nausea, vomiting, abdominal pain and anorexia. Negative for heartburn, diarrhea, constipation, hematochezia and hematemesis.  Genitourinary: Negative for dysuria, urgency, frequency, hematuria, vaginal bleeding and vaginal discharge.  Musculoskeletal: Negative for back pain.  Neurological: Positive for headaches.  All other systems reviewed and are negative.    Allergies  Celecoxib; Ketorolac tromethamine; Meperidine hcl; Ibuprofen; Morphine; Propoxyphene n-acetaminophen; and Tramadol hcl  Home Medications   Current Outpatient Rx  Name Route Sig Dispense Refill  . DIPHENHYDRAMINE HCL 25 MG PO TABS Oral Take 25 mg by mouth every 6 (six) hours.    . ESTRADIOL 2 MG PO TABS Oral Take 1 tablet (2 mg total) by mouth at bedtime. 30 tablet 3  .  ONDANSETRON HCL 4 MG PO TABS Oral Take 4 mg by mouth every 6 (six) hours as needed. For nausea    . OXYCODONE-ACETAMINOPHEN 5-325 MG PO TABS Oral Take 1 tablet by mouth every 4 (four) hours as needed. For pain      BP 125/77  Pulse 96  Temp(Src) 98.1 F (36.7 C) (Oral)  Resp 16  SpO2 99%  LMP 06/01/2011  Physical Exam  Nursing note and vitals reviewed. Constitutional: She is oriented to person,  place, and time. She appears well-developed and well-nourished. No distress.       Appears in NAD  HENT:  Head: Normocephalic and atraumatic.  Right Ear: External ear normal.  Left Ear: External ear normal.  Nose: Nose normal.  Mouth/Throat: Oropharynx is clear and moist. No oropharyngeal exudate.  Eyes: Conjunctivae are normal. Pupils are equal, round, and reactive to light. No scleral icterus.  Neck: Normal range of motion. Neck supple. No thyromegaly present.  Cardiovascular: Normal rate, regular rhythm and normal heart sounds.  Exam reveals no gallop and no friction rub.   No murmur heard. Pulmonary/Chest: Effort normal and breath sounds normal. No respiratory distress. She exhibits no tenderness.  Abdominal: Soft. Bowel sounds are normal. She exhibits no distension. There is tenderness in the epigastric area and left upper quadrant. There is guarding. There is no rebound.    Musculoskeletal: Normal range of motion. She exhibits no edema and no tenderness.  Lymphadenopathy:    She has no cervical adenopathy.  Neurological: She is alert and oriented to person, place, and time. No cranial nerve deficit.  Skin: Skin is warm and dry. No rash noted. No erythema. No pallor.  Psychiatric: She has a normal mood and affect. Her behavior is normal. Judgment and thought content normal.    ED Course  Procedures (including critical care time)  Labs Reviewed  DIFFERENTIAL - Abnormal; Notable for the following:    Eosinophils Relative 6 (*)    All other components within normal limits  COMPREHENSIVE METABOLIC PANEL - Abnormal; Notable for the following:    Albumin 3.4 (*)    Total Bilirubin 0.2 (*)    All other components within normal limits  LIPASE, BLOOD - Abnormal; Notable for the following:    Lipase 268 (*)    All other components within normal limits  CBC  URINALYSIS, ROUTINE W REFLEX MICROSCOPIC   No results found.   Chronic abdominal pain.    MDM  Patient with stable  labs - has requested more IV pain medication, when I told her that I would transition her to her oral pain medication, she has requested to see "another doctor" - Dr. Patria Mane in to see the patient - she will be given another mg of IV dilaudid and then she will be discharged - interestingly she is able to tolerate the po benadryl here without any vomiting.  She appears well hydrated and has a non surgical abdomen.        Izola Price [place], [place] 08/28/11 2217

## 2011-08-28 NOTE — ED Notes (Signed)
Reports abd pain x 4 weeks, thinks its pancreatitis again.  Having n/v/d. Also having headache x 2 days, no acute distress noted at triage.

## 2011-08-29 ENCOUNTER — Encounter (HOSPITAL_COMMUNITY): Payer: Self-pay | Admitting: *Deleted

## 2011-08-29 ENCOUNTER — Emergency Department (HOSPITAL_COMMUNITY)
Admission: EM | Admit: 2011-08-29 | Discharge: 2011-08-30 | Disposition: A | Discharge status: Home Health | Payer: Medicaid Other | Attending: Emergency Medicine | Admitting: Emergency Medicine

## 2011-08-29 DIAGNOSIS — R197 Diarrhea, unspecified: Secondary | ICD-10-CM | POA: Insufficient documentation

## 2011-08-29 DIAGNOSIS — R143 Flatulence: Secondary | ICD-10-CM | POA: Insufficient documentation

## 2011-08-29 DIAGNOSIS — R141 Gas pain: Secondary | ICD-10-CM | POA: Insufficient documentation

## 2011-08-29 DIAGNOSIS — K7689 Other specified diseases of liver: Secondary | ICD-10-CM | POA: Insufficient documentation

## 2011-08-29 DIAGNOSIS — R748 Abnormal levels of other serum enzymes: Secondary | ICD-10-CM

## 2011-08-29 DIAGNOSIS — R112 Nausea with vomiting, unspecified: Secondary | ICD-10-CM | POA: Insufficient documentation

## 2011-08-29 DIAGNOSIS — R142 Eructation: Secondary | ICD-10-CM | POA: Insufficient documentation

## 2011-08-29 DIAGNOSIS — R7402 Elevation of levels of lactic acid dehydrogenase (LDH): Secondary | ICD-10-CM | POA: Insufficient documentation

## 2011-08-29 DIAGNOSIS — R7401 Elevation of levels of liver transaminase levels: Secondary | ICD-10-CM | POA: Insufficient documentation

## 2011-08-29 DIAGNOSIS — R1084 Generalized abdominal pain: Secondary | ICD-10-CM | POA: Insufficient documentation

## 2011-08-29 LAB — COMPREHENSIVE METABOLIC PANEL
ALT: 162 U/L — ABNORMAL HIGH (ref 0–35)
BUN: 15 mg/dL (ref 6–23)
CO2: 22 mEq/L (ref 19–32)
Calcium: 9.5 mg/dL (ref 8.4–10.5)
Creatinine, Ser: 0.67 mg/dL (ref 0.50–1.10)
GFR calc Af Amer: >90 mL/min (ref >90)
GFR calc non Af Amer: >90 mL/min (ref >90)
Glucose, Bld: 86 mg/dL (ref 70–99)
Sodium: 138 mEq/L (ref 135–145)

## 2011-08-29 LAB — URINALYSIS, ROUTINE W REFLEX MICROSCOPIC
Bilirubin Urine: NEGATIVE
Leukocytes, UA: NEGATIVE
Nitrite: NEGATIVE
Specific Gravity, Urine: 1.015 (ref 1.005–1.030)
Urobilinogen, UA: 1 mg/dL (ref 0.0–1.0)

## 2011-08-29 LAB — CBC
HCT: 36.1 % (ref 36.0–46.0)
Hemoglobin: 12.3 g/dL (ref 12.0–15.0)
MCH: 27.6 pg (ref 26.0–34.0)
MCV: 81.1 fL (ref 78.0–100.0)
RBC: 4.45 MIL/uL (ref 3.87–5.11)
WBC: 8.9 10*3/uL (ref 4.0–10.5)

## 2011-08-29 LAB — DIFFERENTIAL
Eosinophils Relative: 8 % — ABNORMAL HIGH (ref 0–5)
Lymphocytes Relative: 33 % (ref 12–46)
Lymphs Abs: 2.9 10*3/uL (ref 0.7–4.0)
Monocytes Absolute: 0.6 10*3/uL (ref 0.1–1.0)
Monocytes Relative: 6 % (ref 3–12)

## 2011-08-29 LAB — URINE MICROSCOPIC-ADD ON

## 2011-08-29 MED ORDER — ONDANSETRON HCL 4 MG/2ML IJ SOLN
4.0000 mg | Freq: Once | INTRAMUSCULAR | Status: AC
  Administered 2011-08-29: 4 mg via INTRAVENOUS
  Filled 2011-08-29: qty 2

## 2011-08-29 MED ORDER — DIPHENHYDRAMINE HCL 50 MG/ML IJ SOLN
25.0000 mg | Freq: Once | INTRAMUSCULAR | Status: AC
  Administered 2011-08-29: 25 mg via INTRAVENOUS
  Filled 2011-08-29: qty 1

## 2011-08-29 MED ORDER — HYDROMORPHONE HCL PF 1 MG/ML IJ SOLN
1.0000 mg | Freq: Once | INTRAMUSCULAR | Status: AC
  Administered 2011-08-29: 1 mg via INTRAVENOUS
  Filled 2011-08-29: qty 1

## 2011-08-29 MED ORDER — HYDROMORPHONE HCL PF 2 MG/ML IJ SOLN
2.0000 mg | Freq: Once | INTRAMUSCULAR | Status: AC
  Administered 2011-08-29: 2 mg via INTRAVENOUS
  Filled 2011-08-29: qty 1

## 2011-08-29 MED ORDER — SODIUM CHLORIDE 0.9 % IV BOLUS (SEPSIS)
1000.0000 mL | Freq: Once | INTRAVENOUS | Status: AC
  Administered 2011-08-29: 1000 mL via INTRAVENOUS

## 2011-08-29 NOTE — ED Notes (Signed)
Patient complaining of abdominal pain; patient states that she was diagnosed with pancreatitis eight years ago.  Patient complaining of upper right and left abdominal pain; rates pain 10/10 on the numerical pain scale and describes pain as "cramping" and "stabbing".  Patient also complaining of nausea and vomiting; last emesis around 2000.  Patient states that she was seen here last night for the same symptoms -- was given nausea medication, but patient states that she is unable to keep it down due to the nausea and vomitng.  Upon palpation, abdomen is soft and tender.  Patient alert and oriented x4; PERRL present.  Upon arrival to room, patient changed into gown.  Family present at bedside.  Will continue to monitor.

## 2011-08-29 NOTE — ED Notes (Signed)
abd pain for 5 weeks.  She says she has pancreatitis and she was seen here last pm for the same

## 2011-08-29 NOTE — ED Notes (Signed)
Patient currently sitting up in bed; no respiratory or acute distress noted.  Patient updated on plan of care; patient informed that we are currently waiting on lab results to come back.  Patient states that she is normally given 2 mg of Dilaudid and that she does not feel 1 mg of Dilaudid working; informed patient that NP will be notified.  Patient has no other questions or concerns at this time; will continue to monitor.

## 2011-08-29 NOTE — ED Provider Notes (Signed)
Medical screening examination/treatment/procedure(s) were conducted as a shared visit with non-physician practitioner(s) and myself.  I personally evaluated the patient during the encounter  The patient has a long-standing history of abdominal pain.  She's been hydrated in the ER.  Her pain has been treated.  It is not uncommon for the patient have an elevated lipase.  She's had multiple GI workups including ERCPs.  The patient will be discharged home with close GI and PCP followup.  She understands to return to the ER for new or worsening symptoms  Lyanne Co, MD 08/29/11 [phone]

## 2011-08-29 NOTE — ED Notes (Signed)
Patient ambulated to restroom to obtain urine specimen.

## 2011-08-29 NOTE — ED Notes (Signed)
Blood samples given to lab tech.

## 2011-08-29 NOTE — ED Notes (Signed)
Patient requesting more pain medication; Thurston Hole, NP notified.

## 2011-08-30 MED ORDER — PROMETHAZINE HCL 25 MG RE SUPP: 25.0000 mg | Freq: Four times a day (QID) | RECTAL | Status: DC | PRN

## 2011-08-30 MED ORDER — DIPHENHYDRAMINE HCL 50 MG/ML IJ SOLN
25.0000 mg | Freq: Once | INTRAMUSCULAR | Status: AC
  Administered 2011-08-30: 25 mg via INTRAVENOUS
  Filled 2011-08-30: qty 1

## 2011-08-30 NOTE — ED Notes (Signed)
Patient currently sitting up in bed; no respiratory or acute distress noted.  Patient updated on plan of care; informed patient that we are currently waiting on discharge paperwork from NP.  Patient has no there questions or concerns at this time; will continue to monitor.

## 2011-08-30 NOTE — ED Provider Notes (Signed)
History     CSN: [id number]  Arrival date & time 08/29/11  2132   First MD Initiated Contact with Patient 08/29/11 2222      Chief Complaint  Patient presents with  . Abdominal Pain    (Consider location/radiation/quality/duration/timing/severity/associated sxs/prior treatment) Patient is a 31 y.o. female presenting with abdominal pain.  Abdominal Pain The primary symptoms of the illness include abdominal pain, nausea, vomiting and diarrhea. The primary symptoms of the illness do not include fever, vaginal discharge or vaginal bleeding. The current episode started more than 2 days ago. The onset of the illness was gradual. The problem has been gradually worsening.  Symptoms associated with the illness do not include chills, diaphoresis, constipation, urgency, hematuria, frequency or back pain. Significant associated medical issues include substance abuse. Significant associated medical issues do not include PUD, GERD, inflammatory bowel disease, diabetes, sickle cell disease, gallstones, diverticulitis, HIV or cardiac disease.   C/o diffuse abdominal pain that is chronic due to pancreatitis.  Multiple ER visits for pain control as well as multiple workups.  States that she cannot keep her medications down. Was here yesterday for the same.   Past Medical History  Diagnosis Date  . Pancreatitis   . S/P laparoscopic cholecystectomy   . Fibroids   . Ovarian cyst   . Chronic abdominal pain     Past Surgical History  Procedure Date  . Cholecystectomy   . Abdominal hysterectomy   . Dilation and curettage of uterus     Family History  Problem Relation Age of Onset  . Diabetes Mother   . Hypertension Mother   . Hypertension Father   . Diabetes Father   . Asthma Daughter     History  Substance Use Topics  . Smoking status: Current Everyday Smoker -- 0.5 packs/day for 10 years    Types: Cigarettes  . Smokeless tobacco: Never Used   Comment: declined  . Alcohol Use: No     OB History    Grav Para Term Preterm Abortions TAB SAB Ect Mult Living   4 2 1 1 2  2          Review of Systems  Constitutional: Negative for fever, chills and diaphoresis.  Gastrointestinal: Positive for nausea, vomiting, abdominal pain, diarrhea and abdominal distention. Negative for constipation.  Genitourinary: Negative for urgency, frequency, hematuria, vaginal bleeding and vaginal discharge.  Musculoskeletal: Negative.  Negative for back pain.  Skin: Negative.   All other systems reviewed and are negative.    Allergies  Celecoxib; Ketorolac tromethamine; Meperidine hcl; Ibuprofen; Morphine; Propoxyphene n-acetaminophen; and Tramadol hcl  Home Medications   Current Outpatient Rx  Name Route Sig Dispense Refill  . DIPHENHYDRAMINE HCL 25 MG PO TABS Oral Take 25 mg by mouth every 6 (six) hours. For itching    . ESTRADIOL 2 MG PO TABS Oral Take 1 tablet (2 mg total) by mouth at bedtime. 30 tablet 3  . OXYCODONE-ACETAMINOPHEN 5-325 MG PO TABS Oral Take 1 tablet by mouth every 4 (four) hours as needed. For pain    . PROMETHAZINE HCL 25 MG RE SUPP Rectal Place 1 suppository (25 mg total) rectally every 6 (six) hours as needed for nausea. 12 each 0  . PROMETHAZINE HCL 25 MG RE SUPP Rectal Place 1 suppository (25 mg total) rectally every 6 (six) hours as needed for nausea. 12 each 0    BP 123/77  Pulse 88  Temp(Src) 97.9 F (36.6 C) (Oral)  Resp 18  SpO2 97%  LMP  06/01/2011  Physical Exam  Nursing note and vitals reviewed. Constitutional: She is oriented to person, place, and time. She appears well-developed and well-nourished.  HENT:  Head: Normocephalic and atraumatic.  Eyes: Conjunctivae and EOM are normal. Pupils are equal, round, and reactive to light.  Neck: Normal range of motion. Neck supple.  Cardiovascular: Normal rate, regular rhythm, normal heart sounds and intact distal pulses.  Exam reveals no gallop and no friction rub.   No murmur  heard. Pulmonary/Chest: Effort normal and breath sounds normal.  Abdominal: Soft. Bowel sounds are normal. She exhibits distension and pulsatile liver. She exhibits no abdominal bruit, no ascites and no pulsatile midline mass. There is hepatosplenomegaly. There is generalized tenderness. There is rigidity. There is no CVA tenderness, no tenderness at McBurney's point and negative Murphy's sign.  Musculoskeletal: Normal range of motion. She exhibits no edema and no tenderness.  Neurological: She is alert and oriented to person, place, and time. She has normal reflexes.  Skin: Skin is warm and dry.  Psychiatric: She has a normal mood and affect.    ED Course  Procedures (including critical care time)  Labs Reviewed  DIFFERENTIAL - Abnormal; Notable for the following:    Eosinophils Relative 8 (*)    All other components within normal limits  COMPREHENSIVE METABOLIC PANEL - Abnormal; Notable for the following:    AST 134 (*)    ALT 162 (*)    All other components within normal limits  URINALYSIS, ROUTINE W REFLEX MICROSCOPIC - Abnormal; Notable for the following:    Hgb urine dipstick TRACE (*)    All other components within normal limits  LIPASE, BLOOD - Abnormal; Notable for the following:    Lipase 67 (*)    All other components within normal limits  URINE MICROSCOPIC-ADD ON - Abnormal; Notable for the following:    Squamous Epithelial / LPF FEW (*)    Bacteria, UA FEW (*)    All other components within normal limits  CBC  LIPASE, BLOOD   No results found.   1. Nausea & vomiting   2. Diarrhea   3. Elevated liver enzymes       MDM  31 yo with diffuse abdominal pain and chronic pancreatitis.  Multiple ER visits for pain and vomiting.  Here yesterday for the same.  Lipase elevated yesterday 238.  Today 67 but liver enzymes elevated.  Instructed to get liver enzymes rechecked tomorrow. Here or Urgent Care with her pcp.  Dilaudid and phenergan for pain. IV fluids.  Feels  Better.   Labs Reviewed  DIFFERENTIAL - Abnormal; Notable for the following:    Eosinophils Relative 8 (*)    All other components within normal limits  COMPREHENSIVE METABOLIC PANEL - Abnormal; Notable for the following:    AST 134 (*)    ALT 162 (*)    All other components within normal limits  URINALYSIS, ROUTINE W REFLEX MICROSCOPIC - Abnormal; Notable for the following:    Hgb urine dipstick TRACE (*)    All other components within normal limits  LIPASE, BLOOD - Abnormal; Notable for the following:    Lipase 67 (*)    All other components within normal limits  URINE MICROSCOPIC-ADD ON - Abnormal; Notable for the following:    Squamous Epithelial / LPF FEW (*)    Bacteria, UA FEW (*)    All other components within normal limits  CBC  LIPASE, BLOOD          Jethro Bastos,  NP 08/30/11 0425

## 2011-08-30 NOTE — ED Provider Notes (Signed)
Medical screening examination/treatment/procedure(s) were performed by non-physician practitioner and as supervising physician I was immediately available for consultation/collaboration.  Anagha Loseke T Ernest Popowski, MD 08/30/11 2233 

## 2011-08-30 NOTE — ED Notes (Signed)
Anne, NP at bedside. 

## 2011-08-30 NOTE — ED Notes (Signed)
Patient currently sitting up in bed; no respiratory or acute distress noted.  Patient updated on plan of care; informed patient that we are currently waiting on Anne, NP to come and talk to patient about lab results.  Patient requesting Benadryl for her itching; patient has no other questions or concerns at this time; will continue to monitor.  Family present at bedside.

## 2011-08-30 NOTE — ED Notes (Signed)
Patient given discharge paperwork; went over discharge instructions with patient.  Patient upset that she is not getting another dose of pain medication before discharge and that the NP did not write for pain medication prescriptions.  Patient instructed that it is critical for her to follow up tomorrow in order to have her liver enzymes rechecked.  Patient instructed to take phenergan as directed, to follow up tomorrow with Urgent Care, and to return to the ED for new, worsening, or concerning symptoms.

## 2011-08-30 NOTE — Discharge Instructions (Signed)
Chest x-ray your liver enzymes are elevated in the ER tonight. He need to have him rechecked tomorrow. You can go to your physician, [facility] urgent care or come back here to have them rechecked.  You lipases coming down from yesterday. He did need a lot of Benadryl and Zofran the ER tonight.  Did not take Tylenol.   B.R.A.T. Diet Your doctor has recommended the B.R.A.T. diet for you or your child until the condition improves. This is often used to help control diarrhea and vomiting symptoms. If you or your child can tolerate clear liquids, you may have:  Bananas.   Rice.   Applesauce.   Toast (and other simple starches such as crackers, potatoes, noodles).  Be sure to avoid dairy products, meats, and fatty foods until symptoms are better. Fruit juices such as apple, grape, and prune juice can make diarrhea worse. Avoid these. Continue this diet for 2 days or as instructed by your caregiver. Document Released: 06/21/2005 Document Revised: 03/03/2011 Document Reviewed: 12/08/2006 [facility] Patient Information 2012 [place], [place].

## 2011-09-01 ENCOUNTER — Emergency Department (HOSPITAL_COMMUNITY)
Admission: EM | Admit: 2011-09-01 | Discharge: 2011-09-02 | Disposition: A | Discharge status: Home / Self Care | Payer: Medicaid Other | Attending: Emergency Medicine | Admitting: Emergency Medicine

## 2011-09-01 ENCOUNTER — Encounter (HOSPITAL_COMMUNITY): Payer: Self-pay | Admitting: Emergency Medicine

## 2011-09-01 DIAGNOSIS — F17200 Nicotine dependence, unspecified, uncomplicated: Secondary | ICD-10-CM | POA: Insufficient documentation

## 2011-09-01 DIAGNOSIS — R109 Unspecified abdominal pain: Secondary | ICD-10-CM | POA: Insufficient documentation

## 2011-09-01 DIAGNOSIS — R112 Nausea with vomiting, unspecified: Secondary | ICD-10-CM | POA: Insufficient documentation

## 2011-09-01 DIAGNOSIS — G8929 Other chronic pain: Secondary | ICD-10-CM

## 2011-09-01 NOTE — ED Notes (Signed)
C/o LUQ pain x 5 weeks.  Pt states she was seen on Monday for pancreatitis and is feeling worse.  Reports nausea, vomiting, and diarrhea.

## 2011-09-02 ENCOUNTER — Emergency Department (HOSPITAL_COMMUNITY): Payer: Medicaid Other

## 2011-09-02 LAB — COMPREHENSIVE METABOLIC PANEL
ALT: 74 U/L — ABNORMAL HIGH (ref 0–35)
Alkaline Phosphatase: 73 U/L (ref 39–117)
CO2: 24 mEq/L (ref 19–32)
GFR calc Af Amer: >90 mL/min (ref >90)
GFR calc non Af Amer: >90 mL/min (ref >90)
Glucose, Bld: 90 mg/dL (ref 70–99)
Potassium: 3.7 mEq/L (ref 3.5–5.1)
Sodium: 139 mEq/L (ref 135–145)

## 2011-09-02 LAB — DIFFERENTIAL
Basophils Absolute: 0 10*3/uL (ref 0.0–0.1)
Myelocytes: 0 %
Neutro Abs: 5.3 10*3/uL (ref 1.7–7.7)
Neutrophils Relative %: 60 % (ref 43–77)
Promyelocytes Absolute: 0 %
nRBC: 0 /100 WBC

## 2011-09-02 LAB — CBC
MCH: 28.2 pg (ref 26.0–34.0)
MCHC: 33.9 g/dL (ref 30.0–36.0)
Platelets: 268 10*3/uL (ref 150–400)
RDW: 12.3 % (ref 11.5–15.5)

## 2011-09-02 LAB — URINALYSIS, ROUTINE W REFLEX MICROSCOPIC
Bilirubin Urine: NEGATIVE
Hgb urine dipstick: NEGATIVE
Nitrite: NEGATIVE
Specific Gravity, Urine: 1.026 (ref 1.005–1.030)
pH: 7 (ref 5.0–8.0)

## 2011-09-02 MED ORDER — PROMETHAZINE HCL 25 MG RE SUPP: 25.0000 mg | Freq: Four times a day (QID) | RECTAL | Status: DC | PRN

## 2011-09-02 MED ORDER — HYDROMORPHONE HCL PF 2 MG/ML IJ SOLN
2.0000 mg | Freq: Once | INTRAMUSCULAR | Status: AC
  Administered 2011-09-02: 2 mg via INTRAMUSCULAR
  Filled 2011-09-02: qty 1

## 2011-09-02 MED ORDER — PROMETHAZINE HCL 25 MG/ML IJ SOLN
25.0000 mg | Freq: Once | INTRAMUSCULAR | Status: AC
  Administered 2011-09-02: 25 mg via INTRAMUSCULAR
  Filled 2011-09-02: qty 1

## 2011-09-02 MED ORDER — DIPHENHYDRAMINE HCL 50 MG/ML IJ SOLN
25.0000 mg | Freq: Once | INTRAMUSCULAR | Status: AC
  Administered 2011-09-02: 25 mg via INTRAMUSCULAR
  Filled 2011-09-02: qty 1

## 2011-09-02 NOTE — ED Provider Notes (Signed)
History     CSN: [id number]  Arrival date & time 09/01/11  2253   First MD Initiated Contact with Patient 09/02/11 0153      Chief Complaint  Patient presents with  . Pancreatitis    HPI Pt was seen at 0220.  Per pt, c/o gradual onset and persistence of constant acute flair of her chronic abd pain for the past month.  Has been assoc with several episodes of N/V.  Describes the pain as "bloating."  Denies any change in her usual chronic pain pattern and associated symptoms.  Denies black or blood in stools or emesis, no back pain, no diarrhea, no fevers, no CP/SOB, no rash.  Pt has long hx of same symptoms, and has had extensive work up for this complaint.  Pt has multiple ED visits and admissions for same, with last ED visit 3 days ago.         Past Medical History  Diagnosis Date  . Pancreatitis   . S/P laparoscopic cholecystectomy   . Fibroids   . Ovarian cyst   . Chronic abdominal pain     Past Surgical History  Procedure Date  . Cholecystectomy   . Abdominal hysterectomy   . Dilation and curettage of uterus     Family History  Problem Relation Age of Onset  . Diabetes Mother   . Hypertension Mother   . Hypertension Father   . Diabetes Father   . Asthma Daughter     History  Substance Use Topics  . Smoking status: Current Everyday Smoker -- 0.5 packs/day for 10 years    Types: Cigarettes  . Smokeless tobacco: Never Used   Comment: declined  . Alcohol Use: No    OB History    Grav Para Term Preterm Abortions TAB SAB Ect Mult Living   4 2 1 1 2  2          Review of Systems ROS: Statement: All systems negative except as marked or noted in the HPI; Constitutional: Negative for fever and chills. ; ; Eyes: Negative for eye pain, redness and discharge. ; ; ENMT: Negative for ear pain, hoarseness, nasal congestion, sinus pressure and sore throat. ; ; Cardiovascular: Negative for chest pain, palpitations, diaphoresis, dyspnea and peripheral edema. ; ; Respiratory:  Negative for cough, wheezing and stridor. ; ; Gastrointestinal: +N/V, abd pain. Negative for diarrhea, blood in stool, hematemesis, jaundice and rectal bleeding. . ; ; Genitourinary: Negative for dysuria, flank pain and hematuria. ; ; Musculoskeletal: Negative for back pain and neck pain. Negative for swelling and trauma.; ; Skin: Negative for pruritus, rash, abrasions, blisters, bruising and skin lesion.; ; Neuro: Negative for headache, lightheadedness and neck stiffness. Negative for weakness, altered level of consciousness , altered mental status, extremity weakness, paresthesias, involuntary movement, seizure and syncope.       Allergies  Celecoxib; Ketorolac tromethamine; Meperidine hcl; Ibuprofen; Morphine; Propoxyphene n-acetaminophen; and Tramadol hcl  Home Medications   Current Outpatient Rx  Name Route Sig Dispense Refill  . DIPHENHYDRAMINE HCL 25 MG PO TABS Oral Take 25 mg by mouth every 6 (six) hours. For itching    . ESTRADIOL 2 MG PO TABS Oral Take 1 tablet (2 mg total) by mouth at bedtime. 30 tablet 3  . PROMETHAZINE HCL 25 MG RE SUPP Rectal Place 1 suppository (25 mg total) rectally every 6 (six) hours as needed for nausea. 12 each 0  . PROMETHAZINE HCL 25 MG RE SUPP Rectal Place 1 suppository (25 mg total)  rectally every 6 (six) hours as needed for nausea. 12 each 0    BP 120/87  Pulse 83  Temp(Src) 98.1 F (36.7 C) (Oral)  Resp 18  SpO2 100%  LMP 06/01/2011  Physical Exam 0225: Physical examination:  Nursing notes reviewed; Vital signs and O2 SAT reviewed;  Constitutional: Well developed, Well nourished, Well hydrated, In no acute distress; Head:  Normocephalic, atraumatic; Eyes: EOMI, PERRL, No scleral icterus; ENMT: Mouth and pharynx normal, Mucous membranes moist; Neck: Supple, Full range of motion, No lymphadenopathy; Cardiovascular: Regular rate and rhythm, No murmur, rub, or gallop; Respiratory: Breath sounds clear & equal bilaterally, No rales, rhonchi, wheezes,  or rub, Normal respiratory effort/excursion; Chest: Nontender, Movement normal; Abdomen: Soft, +mild mid-epigastric tenderness to palp, no rebound or guarding, Nondistended, Normal bowel sounds; Genitourinary: No CVA tenderness; Extremities: Pulses normal, No tenderness, No edema, No calf edema or asymmetry.; Neuro: AA&Ox3, Major CN grossly intact.  No gross focal motor or sensory deficits in extremities.; Skin: Color normal, Warm, Dry, no rash.   ED Course  Procedures   0225:  Pt states she has an upcoming appt with Dr. Carolynne Edouard "for one of those camera tests."  Pt appears NAD, playing on her handheld device, no active N/V.  Pt has long hx of same symptoms with multiple ED evals for same.  Will check labs, XR, IM meds for pain/nausea.  Pt agreeable.    MDM  MDM Reviewed: nursing note, vitals and previous chart Reviewed previous: labs Interpretation: labs and x-ray   Results for orders placed during the hospital encounter of 09/01/11  COMPREHENSIVE METABOLIC PANEL      Component Value Range   Sodium 139  135 - 145 (mEq/L)   Potassium 3.7  3.5 - 5.1 (mEq/L)   Chloride 105  96 - 112 (mEq/L)   CO2 24  19 - 32 (mEq/L)   Glucose, Bld 90  70 - 99 (mg/dL)   BUN 13  6 - 23 (mg/dL)   Creatinine, Ser 1.61  0.50 - 1.10 (mg/dL)   Calcium 9.3  8.4 - 09.6 (mg/dL)   Total Protein 7.7  6.0 - 8.3 (g/dL)   Albumin 3.7  3.5 - 5.2 (g/dL)   AST 26  0 - 37 (U/L)   ALT 74 (*) 0 - 35 (U/L)   Alkaline Phosphatase 73  39 - 117 (U/L)   Total Bilirubin 0.1 (*) 0.3 - 1.2 (mg/dL)   GFR calc non Af Amer >90  >90 (mL/min)   GFR calc Af Amer >90  >90 (mL/min)  LIPASE, BLOOD      Component Value Range   Lipase 157 (*) 11 - 59 (U/L)  CBC      Component Value Range   WBC 8.9  4.0 - 10.5 (K/uL)   RBC 4.72  3.87 - 5.11 (MIL/uL)   Hemoglobin 13.3  12.0 - 15.0 (g/dL)   HCT 04.5  40.9 - 81.1 (%)   MCV 83.1  78.0 - 100.0 (fL)   MCH 28.2  26.0 - 34.0 (pg)   MCHC 33.9  30.0 - 36.0 (g/dL)   RDW 91.4  78.2 - 95.6 (%)     Platelets 268  150 - 400 (K/uL)  DIFFERENTIAL      Component Value Range   Neutrophils Relative 60  43 - 77 (%)   Lymphocytes Relative 34  12 - 46 (%)   Monocytes Relative 2 (*) 3 - 12 (%)   Eosinophils Relative 4  0 - 5 (%)  Basophils Relative 0  0 - 1 (%)   Band Neutrophils 0  0 - 10 (%)   Metamyelocytes Relative 0     Myelocytes 0     Promyelocytes Absolute 0     Blasts 0     nRBC 0  0 (/100 WBC)   Neutro Abs 5.3  1.7 - 7.7 (K/uL)   Lymphs Abs 3.0  0.7 - 4.0 (K/uL)   Monocytes Absolute 0.2  0.1 - 1.0 (K/uL)   Eosinophils Absolute 0.4  0.0 - 0.7 (K/uL)   Basophils Absolute 0.0  0.0 - 0.1 (K/uL)  URINALYSIS, ROUTINE W REFLEX MICROSCOPIC      Component Value Range   Color, Urine YELLOW  YELLOW    APPearance CLEAR  CLEAR    Specific Gravity, Urine 1.026  1.005 - 1.030    pH 7.0  5.0 - 8.0    Glucose, UA NEGATIVE  NEGATIVE (mg/dL)   Hgb urine dipstick NEGATIVE  NEGATIVE    Bilirubin Urine NEGATIVE  NEGATIVE    Ketones, ur NEGATIVE  NEGATIVE (mg/dL)   Protein, ur NEGATIVE  NEGATIVE (mg/dL)   Urobilinogen, UA 1.0  0.0 - 1.0 (mg/dL)   Nitrite NEGATIVE  NEGATIVE    Leukocytes, UA NEGATIVE  NEGATIVE     Results for TEKEISHA, HAKIM (MRN [id number]) as of 09/02/2011 04:15  Ref. Range 08/09/2011 08:20 08/11/2011 06:20 08/20/2011 23:50 08/28/2011 20:17 08/29/2011 23:06 09/02/2011 02:09  Lipase Latest Range: 11-59 U/L 164 (H) 154 (H) 175 (H) 268 (H) 67 (H) 157 (H)    Results for TAYIA, STONESIFER (MRN [id number]) as of 09/02/2011 04:15  Ref. Range 08/09/2011 08:20 08/20/2011 23:50 08/28/2011 20:17 08/29/2011 23:06 09/02/2011 02:09  AST Latest Range: 0-37 U/L 16 21 27  134 (H) 26  ALT Latest Range: 0-35 U/L 9 20 16  162 (H) 74 (H)    Dg Abd Acute W/chest 09/02/2011  *RADIOLOGY REPORT*  Clinical Data: Upper abdominal pain for 5 weeks.  ACUTE ABDOMEN SERIES (ABDOMEN 2 VIEW & CHEST 1 VIEW)  Comparison: Chest and abdominal radiographs performed 04/28/2011, and CT of the abdomen and pelvis performed  06/11/2011  Findings: The lungs are well-aerated and clear.  There is no evidence of focal opacification, pleural effusion or pneumothorax. The cardiomediastinal silhouette is within normal limits.  The visualized bowel gas pattern is unremarkable.  Scattered stool and air are seen within the colon; there is no evidence of small bowel dilatation to suggest obstruction.  No free intra-abdominal air is identified on the provided upright view.  A small amount of air is seen within the stomach.  No acute osseous abnormalities are seen; the sacroiliac joints are unremarkable in appearance.  Clips are noted within the right upper quadrant, reflecting prior cholecystectomy.  IMPRESSION:  1.  Unremarkable bowel gas pattern; no free intra-abdominal air seen. 2.  No acute cardiopulmonary process identified.  Original Report Authenticated By: Tonia Ghent, M.D.      5:07 AM:  Has tol PO well while in ED without N/V.  Has spent the majority of her ED visit talking and laughing on the landline telephone and using on her handheld electronic device.  Long hx chronic abd pain, N/V.  Labs are consistent with her norm.  Wants to go home now.  Dx testing d/w pt.  Questions answered.  Verb understanding, agreeable to d/c home with outpt f/u.       Laray Anger, DO 09/03/11 [phone]

## 2011-09-02 NOTE — ED Notes (Signed)
rx x 1, pt voiced understanding to f/u with PCP.  Dr. Clarene Duke went over results and d/c instructions with pt.  Pt going home with friend after narcotics

## 2011-09-02 NOTE — Discharge Instructions (Signed)
RESOURCE GUIDE  Dental Problems  Patients with Medicaid: [facility]                     [facility] [address]                                           [address] Phone:  [phone]                                                  Phone:  [phone]  If unable to pay or uninsured, contact:  [facility] or [facility] to become qualified for the adult dental clinic.  Chronic Pain Problems Contact [facility]  [phone] Patients need to be referred by their primary care doctor.  Insufficient Money for Medicine Contact [employer]:  call "211" or [facility] 271-5999.  No Primary Care Doctor Call [facility]  [phone] Other agencies that provide inexpensive medical care    [facility]  [phone]    [facility]  [phone]    [facility]  [phone]    Women's Clinic  [phone]    [facility]  [phone]    [facility]  [phone]  Psychological Services [facility]  [phone] [facility]  [phone] [facility]   [phone] (emergency services [phone])  Substance Abuse Resources [facility]  336-882-2125 [facility] 336-784-9470 [facility] 336-285-9073 [facility] 336-845-3988 Residential & Outpatient Substance Abuse Program  800-659-3381  Abuse/Neglect [place] Child Abuse Hotline (336) 641-3795 [place] Child Abuse Hotline 800-378-5315 (After Hours)  Emergency Shelter [facility] (336) 271-5985  Maternity Homes [facility] (336) 275-9566 [facility] (704) 372-4663  MRSA Hotline #:   832-7006    [place] Resources  [facility] [place] [facility]     [employer]                          [facility] [address] [place]                       [address]      [address]  [place]                                                [place]                            [place] Phone:  [phone]                                   Phone:  [phone]                 Phone:  [phone]  [facility] Phone:  [phone]    [facility] Child Abuse Hotline 734-421-1409 570-284-4013 (After Hours)    Take the prescription as directed.  Increase your fluid intake (ie:  Gatoraide) for the next few days.  Eat a bland diet and advance to your regular diet slowly as you can tolerate it.  Call your regular medical doctor today to schedule a follow up appointment this week.  Return to the Emergency Department immediately if not improving (or even worsening) despite taking the medicines as prescribed, any black or bloody stool or vomit, if you develop a fever, or for any other concerns.

## 2011-09-04 ENCOUNTER — Encounter (HOSPITAL_COMMUNITY): Payer: Self-pay | Admitting: Emergency Medicine

## 2011-09-04 ENCOUNTER — Emergency Department (HOSPITAL_COMMUNITY): Payer: Medicaid Other

## 2011-09-04 ENCOUNTER — Emergency Department (HOSPITAL_COMMUNITY)
Admission: EM | Admit: 2011-09-04 | Discharge: 2011-09-04 | Disposition: A | Discharge status: Home / Self Care | Payer: Medicaid Other | Source: Home / Self Care | Attending: Emergency Medicine | Admitting: Emergency Medicine

## 2011-09-04 DIAGNOSIS — R112 Nausea with vomiting, unspecified: Secondary | ICD-10-CM | POA: Insufficient documentation

## 2011-09-04 DIAGNOSIS — K861 Other chronic pancreatitis: Secondary | ICD-10-CM

## 2011-09-04 DIAGNOSIS — R630 Anorexia: Secondary | ICD-10-CM | POA: Insufficient documentation

## 2011-09-04 DIAGNOSIS — R109 Unspecified abdominal pain: Secondary | ICD-10-CM | POA: Insufficient documentation

## 2011-09-04 HISTORY — DX: Anemia, unspecified: D64.9

## 2011-09-04 LAB — DIFFERENTIAL
Basophils Relative: 0 % (ref 0–1)
Eosinophils Absolute: 0.7 10*3/uL (ref 0.0–0.7)
Monocytes Relative: 9 % (ref 3–12)
Neutrophils Relative %: 50 % (ref 43–77)

## 2011-09-04 LAB — COMPREHENSIVE METABOLIC PANEL
Albumin: 3.6 g/dL (ref 3.5–5.2)
Alkaline Phosphatase: 77 U/L (ref 39–117)
BUN: 18 mg/dL (ref 6–23)
Calcium: 9.1 mg/dL (ref 8.4–10.5)
Potassium: 3.8 mEq/L (ref 3.5–5.1)
Sodium: 137 mEq/L (ref 135–145)
Total Protein: 7.5 g/dL (ref 6.0–8.3)

## 2011-09-04 LAB — CBC
MCH: 27.6 pg (ref 26.0–34.0)
MCHC: 33.7 g/dL (ref 30.0–36.0)
Platelets: 255 10*3/uL (ref 150–400)

## 2011-09-04 LAB — LIPASE, BLOOD: Lipase: 187 U/L — ABNORMAL HIGH (ref 11–59)

## 2011-09-04 MED ORDER — FENTANYL CITRATE 0.05 MG/ML IJ SOLN
50.0000 ug | Freq: Once | INTRAMUSCULAR | Status: DC
  Filled 2011-09-04: qty 2

## 2011-09-04 MED ORDER — HYDROMORPHONE HCL PF 1 MG/ML IJ SOLN
1.0000 mg | Freq: Once | INTRAMUSCULAR | Status: AC
  Administered 2011-09-04: 1 mg via INTRAVENOUS
  Filled 2011-09-04: qty 1

## 2011-09-04 MED ORDER — DIPHENHYDRAMINE HCL 50 MG/ML IJ SOLN
25.0000 mg | Freq: Once | INTRAMUSCULAR | Status: AC
  Administered 2011-09-04: 25 mg via INTRAVENOUS
  Filled 2011-09-04: qty 1

## 2011-09-04 MED ORDER — SODIUM CHLORIDE 0.9 % IV BOLUS (SEPSIS)
1000.0000 mL | Freq: Once | INTRAVENOUS | Status: AC
  Administered 2011-09-04: 1000 mL via INTRAVENOUS

## 2011-09-04 MED ORDER — ONDANSETRON HCL 4 MG/2ML IJ SOLN
4.0000 mg | Freq: Once | INTRAMUSCULAR | Status: AC
  Administered 2011-09-04: 4 mg via INTRAVENOUS
  Filled 2011-09-04: qty 2

## 2011-09-04 NOTE — Discharge Instructions (Signed)
Chronic Pain Chronic pain can be defined as pain that is lasting, off and on, and lasts for 3 to 6 months or longer. Many things cause chronic pain, which can make it difficult to make a discrete diagnosis. There are many treatment options available for chronic pain. However, finding a treatment that works well for you may require trying various approaches until a suitable one is found. CAUSES  In some types of chronic medical conditions, the pain is caused by a normal pain response within the body. A normal pain response helps the body identify illness or injury and prevent further damage from being done. In these cases, the cause of the pain may be identified and treated, even if it may not be cured completely. Examples of chronic conditions which can cause chronic pain include:  Inflammation of the joints (arthritis).   Back pain or neck pain (including bulging or herniated disks).   Migraine headaches.   Cancer.  In some other types of chronic pain syndromes, the pain is caused by an abnormal pain response within the body. An abnormal pain response is present when there is no ongoing cause (or stimulus) for the pain, or when the cause of the pain is arising from the nerves or nervous system itself. Examples of conditions which can cause chronic pain due to an abnormal pain response include:  Fibromyalgia.   Reflex sympathetic dystrophy (RSD).   Neuropathy (when the nerves themselves are damaged, and may cause pain).  DIAGNOSIS  Your caregiver will help diagnose your condition over time. In many cases, the initial focus will be on excluding conditions that could be causing the pain. Depending on your symptoms, your caregiver may order some tests to diagnose your condition. Some of these tests include:  Blood tests.   Computerized X-Phylliss Strege scans (CT scan).   Computerized magnetic scans (MRI).   X-rays.   Ultrasounds.   Nerve conduction studies.   Consultation with other physicians or  specialists.  TREATMENT  There are many treatment options for people suffering from chronic pain. Finding a treatment that works well may take time.   You may be referred to a pain management specialist.   You may be put on medication to help with the pain. Unfortunately, some medications (such as opiate medications) may not be very effective in cases where chronic pain is due to abnormal pain responses. Finding the right medications can take some time.   Adjunctive therapies may be used to provide additional relief and improve a patient's quality of life. These therapies include:   Mindfulness meditation.   Acupuncture.   Biofeedback.   Cognitive-behavioral therapy.   In certain cases, surgical interventions may be attempted.  HOME CARE INSTRUCTIONS   Make sure you understand these instructions prior to discharge.   Ask any questions and share any further concerns you have with your caregiver prior to discharge.   Take all medications as directed by your caregiver.   Keep all follow-up appointments.  SEEK MEDICAL CARE IF:   Your pain gets worse.   You develop a new pain that was not present before.   You cannot tolerate any medications prescribed by your caregiver.   You develop new symptoms since your last visit with your caregiver.  SEEK IMMEDIATE MEDICAL CARE IF:   You develop muscular weakness.   You have decreased sensation or numbness.   You lose control of bowel or bladder function.   Your pain suddenly gets much worse.   You have an oral   temperature above 102 F (38.9 C), not controlled by medication.   You develop shaking chills, confusion, chest pain, or shortness of breath.  Document Released: 03/13/2002 Document Revised: 03/03/2011 Document Reviewed: 06/19/2008 [facility] Patient Information 2012 [place], [place].Abdominal Pain Many things can cause belly (abdominal) pain. Most times, the belly pain is not dangerous. The amount of belly pain does not  tell how serious the problem may be. Many cases of belly pain can be watched and treated at home. HOME CARE   Do not take medicines that help you go poop (laxatives) unless told to by your doctor.   Only take medicine as told by your doctor.   Eat or drink as told by your doctor. Your doctor will tell you if you should be on a special diet.  GET HELP RIGHT AWAY IF:   The pain does not go away.   You have a fever.   You keep throwing up (vomiting).   The pain changes and is only in the right or left part of the belly.   You have bloody or tarry looking poop.  MAKE SURE YOU:   Understand these instructions.   Will watch your condition.   Will get help right away if you are not doing well or get worse.  Document Released: 12/08/2007 Document Revised: 03/03/2011 Document Reviewed: 07/07/2009 [facility] Patient Information 2012 [place], [place].

## 2011-09-04 NOTE — ED Notes (Signed)
Pt states she has been having abd pain for the past 6 weeks  Pt states she has pancreatitis and has been being seen at [facility]  Pt states her liver enzyme levels are elevated  Pt states she has been having vomiting and diarrhea

## 2011-09-04 NOTE — ED Provider Notes (Signed)
History     CSN: [id number]  Arrival date & time 09/04/11  0306   First MD Initiated Contact with Patient 09/04/11 0431      Chief Complaint  Patient presents with  . Abdominal Pain    (Consider location/radiation/quality/duration/timing/severity/associated sxs/prior treatment) HPI Patient with upper abdominal pain for six weeks with history of chronic abdominal pain/pancreatitis eight years. S/P cholecystectomy 2007.  Vomited clear liquid today.  Patient taking po but states " i can't keep anything down."  Patient states she tries to use nausea medicine zofran odt and phenergan suppositories but still vomits up water.    Past Medical History  Diagnosis Date  . Pancreatitis   . S/P laparoscopic cholecystectomy   . Fibroids   . Ovarian cyst   . Chronic abdominal pain   . Anemia     Past Surgical History  Procedure Date  . Cholecystectomy   . Abdominal hysterectomy   . Dilation and curettage of uterus     Family History  Problem Relation Age of Onset  . Diabetes Mother   . Hypertension Mother   . Hypertension Father   . Diabetes Father   . Asthma Daughter     History  Substance Use Topics  . Smoking status: Current Everyday Smoker -- 0.5 packs/day for 10 years    Types: Cigarettes  . Smokeless tobacco: Never Used   Comment: declined  . Alcohol Use: No    OB History    Grav Para Term Preterm Abortions TAB SAB Ect Mult Living   4 2 1 1 2  2          Review of Systems  All other systems reviewed and are negative.    Allergies  Celecoxib; Ketorolac tromethamine; Meperidine hcl; Ibuprofen; Morphine; Propoxyphene n-acetaminophen; and Tramadol hcl  Home Medications   Current Outpatient Rx  Name Route Sig Dispense Refill  . ESTRADIOL 2 MG PO TABS Oral Take 1 tablet (2 mg total) by mouth at bedtime. 30 tablet 3    BP 127/62  Pulse 84  Temp(Src) 98.3 F (36.8 C) (Oral)  Resp 20  SpO2 100%  LMP 06/01/2011  Physical Exam  Vitals  reviewed. Constitutional: She is oriented to person, place, and time. She appears well-developed and well-nourished.  HENT:  Head: Normocephalic and atraumatic.  Right Ear: External ear normal.  Left Ear: External ear normal.  Eyes: Conjunctivae and EOM are normal. Pupils are equal, round, and reactive to light.  Neck: Normal range of motion.  Cardiovascular: Normal rate, regular rhythm and normal heart sounds.   Pulmonary/Chest: Effort normal and breath sounds normal.  Abdominal: Soft. Bowel sounds are normal.  Musculoskeletal: Normal range of motion.  Neurological: She is alert and oriented to person, place, and time.  Skin: Skin is warm and dry.  Psychiatric: She has a normal mood and affect.    ED Course  Procedures (including critical care time)  Labs Reviewed - No data to display No results found.   No diagnosis found.    MDM   Results for orders placed during the hospital encounter of 09/04/11  CBC      Component Value Range   WBC 9.0  4.0 - 10.5 (K/uL)   RBC 4.45  3.87 - 5.11 (MIL/uL)   Hemoglobin 12.3  12.0 - 15.0 (g/dL)   HCT 40.9  81.1 - 91.4 (%)   MCV 82.0  78.0 - 100.0 (fL)   MCH 27.6  26.0 - 34.0 (pg)   MCHC 33.7  30.0 -  36.0 (g/dL)   RDW 29.5  28.4 - 13.2 (%)   Platelets 255  150 - 400 (K/uL)  DIFFERENTIAL      Component Value Range   Neutrophils Relative 50  43 - 77 (%)   Neutro Abs 4.5  1.7 - 7.7 (K/uL)   Lymphocytes Relative 34  12 - 46 (%)   Lymphs Abs 3.0  0.7 - 4.0 (K/uL)   Monocytes Relative 9  3 - 12 (%)   Monocytes Absolute 0.8  0.1 - 1.0 (K/uL)   Eosinophils Relative 7 (*) 0 - 5 (%)   Eosinophils Absolute 0.7  0.0 - 0.7 (K/uL)   Basophils Relative 0  0 - 1 (%)   Basophils Absolute 0.0  0.0 - 0.1 (K/uL)  COMPREHENSIVE METABOLIC PANEL      Component Value Range   Sodium 137  135 - 145 (mEq/L)   Potassium 3.8  3.5 - 5.1 (mEq/L)   Chloride 104  96 - 112 (mEq/L)   CO2 24  19 - 32 (mEq/L)   Glucose, Bld 97  70 - 99 (mg/dL)   BUN 18  6 -  23 (mg/dL)   Creatinine, Ser 4.40  0.50 - 1.10 (mg/dL)   Calcium 9.1  8.4 - 10.2 (mg/dL)   Total Protein 7.5  6.0 - 8.3 (g/dL)   Albumin 3.6  3.5 - 5.2 (g/dL)   AST 40 (*) 0 - 37 (U/L)   ALT 49 (*) 0 - 35 (U/L)   Alkaline Phosphatase 77  39 - 117 (U/L)   Total Bilirubin 0.1 (*) 0.3 - 1.2 (mg/dL)   GFR calc non Af Amer >90  >90 (mL/min)   GFR calc Af Amer >90  >90 (mL/min)  LIPASE, BLOOD      Component Value Range   Lipase 187 (*) 11 - 59 (U/L)     Patient without signs or symptoms of volume depletion. She has not vomited here. Her lipase is elevated at 187 she has a chronically elevated lipase. She has had a liter of normal saline here. She received 1 mg of Dilaudid and 25 mg of Benadryl. She states that she has chronic itching has been told this is due to elevated liver function tests. She is instructed to followup with her primary care doctors that helps her.     Hilario Quarry, MD 09/04/11 [phone]

## 2011-09-05 ENCOUNTER — Encounter (HOSPITAL_COMMUNITY): Payer: Self-pay | Admitting: *Deleted

## 2011-09-05 ENCOUNTER — Emergency Department (HOSPITAL_COMMUNITY)
Admission: EM | Admit: 2011-09-05 | Discharge: 2011-09-05 | Disposition: A | Discharge status: Home / Self Care | Payer: Medicaid Other | Attending: Emergency Medicine | Admitting: Emergency Medicine

## 2011-09-05 DIAGNOSIS — G8929 Other chronic pain: Secondary | ICD-10-CM | POA: Insufficient documentation

## 2011-09-05 DIAGNOSIS — R112 Nausea with vomiting, unspecified: Secondary | ICD-10-CM | POA: Insufficient documentation

## 2011-09-05 DIAGNOSIS — R109 Unspecified abdominal pain: Secondary | ICD-10-CM | POA: Insufficient documentation

## 2011-09-05 MED ORDER — ACETAMINOPHEN 325 MG PO TABS: 650.0000 mg | ORAL_TABLET | Freq: Once | ORAL | Status: DC

## 2011-09-05 MED ORDER — ONDANSETRON 4 MG PO TBDP: 8.0000 mg | ORAL_TABLET | Freq: Once | ORAL | Status: DC

## 2011-09-05 NOTE — Discharge Instructions (Signed)
Emergency care providers appreciate that many patients coming to us are in severe pain and we wish to address their pain in the safest, most responsible manner.  It is important to recognize however, that the proper treatment of chronic pain differs from that of the pain of injuries and acute illnesses.  Our goal is to provide quality, safe, personalized care and we thank you for giving us the opportunity to serve you. °The use of narcotics and related agents for chronic pain syndromes may lead to additional physical and psychological problems.  Nearly as many people die from prescription narcotics each year as die from car crashes.  Additionally, this risk is increased if such prescriptions are obtained from a variety of sources.  Therefore, only your primary care physician or a pain management specialist is able to safely treat such syndromes with narcotic medications long-term.   ° °Documentation revealing such prescriptions have been sought from multiple sources may prohibit us from providing a refill or different narcotic medication.  Your name may be checked first through the [place] Controlled Substances Reporting System.  This database is a record of controlled substance medication prescriptions that the patient has received.  This has been established by [facility] in an effort to eliminate the dangerous, and often life threatening, practice of obtaining multiple prescriptions from different medical providers.  ° °If you have a chronic pain syndrome (i.e. chronic headaches, recurrent back or neck pain, dental pain, abdominal or pelvis pain without a specific diagnosis, or neuropathic pain such as fibromyalgia) or recurrent visits for the same condition without an acute diagnosis, you may be treated with non-narcotics and other non-addictive medicines.  Allergic reactions or negative side effects that may be reported by a patient to such medications will not typically lead to the use of a narcotic  analgesic or other controlled substance as an alternative. °  °Patients managing chronic pain with a personal physician should have provisions in place for breakthrough pain.  If you are in crisis, you should call your physician.  If your physician directs you to the emergency department, please have the doctor call and speak to our attending physician concerning your care. °  °When patients come to the Emergency Department (ED) with acute medical conditions in which the Emergency Department physician feels appropriate to prescribe narcotic or sedating pain medication, the physician will prescribe these in very limited quantities.  The amount of these medications will last only until you can see your primary care physician in his/her office.  Any patient who returns to the ED seeking refills should expect only non-narcotic pain medications.  ° °  °Prescriptions for narcotic or sedating medications that have been lost, stolen or expired will not be refilled in the Emergency Department.   ° ° ° ° ° ° °

## 2011-09-05 NOTE — ED Provider Notes (Signed)
History     CSN: [id number]  Arrival date & time 09/05/11  2205   First MD Initiated Contact with Patient 09/05/11 2224      Chief Complaint  Patient presents with  . Abdominal Pain    Patient is a 31 y.o. female presenting with abdominal pain. The history is provided by the patient.  Abdominal Pain The primary symptoms of the illness include abdominal pain, nausea and vomiting. The primary symptoms of the illness do not include fever. Episode onset: 6 weeks ago. The onset of the illness was gradual. The problem has been rapidly worsening.  pt with chronic abdominal pain She reports continued pain, similar to prior episode Also reports nausea/vomiting  Past Medical History  Diagnosis Date  . Pancreatitis   . S/P laparoscopic cholecystectomy   . Fibroids   . Ovarian cyst   . Chronic abdominal pain   . Anemia     Past Surgical History  Procedure Date  . Cholecystectomy   . Abdominal hysterectomy   . Dilation and curettage of uterus     Family History  Problem Relation Age of Onset  . Diabetes Mother   . Hypertension Mother   . Hypertension Father   . Diabetes Father   . Asthma Daughter     History  Substance Use Topics  . Smoking status: Current Everyday Smoker -- 0.5 packs/day for 10 years    Types: Cigarettes  . Smokeless tobacco: Never Used   Comment: declined  . Alcohol Use: No    OB History    Grav Para Term Preterm Abortions TAB SAB Ect Mult Living   4 2 1 1 2  2          Review of Systems  Constitutional: Negative for fever.  Gastrointestinal: Positive for nausea, vomiting and abdominal pain.    Allergies  Celecoxib; Ketorolac tromethamine; Meperidine hcl; Fentanyl; Ibuprofen; Morphine; Propoxyphene n-acetaminophen; and Tramadol hcl  Home Medications   Current Outpatient Rx  Name Route Sig Dispense Refill  . ESTRADIOL 2 MG PO TABS Oral Take 1 tablet (2 mg total) by mouth at bedtime. 30 tablet 3    BP 117/80  Pulse 78  Temp(Src) 98.3 F  (36.8 C) (Oral)  Resp 16  SpO2 100%  LMP 06/01/2011  Physical Exam CONSTITUTIONAL: Well developed/well nourished HEAD AND FACE: Normocephalic/atraumatic EYES: EOMI/PERRL, no scleral icterus ENMT: Mucous membranes moist NECK: supple no meningeal signs CV: S1/S2 noted, no murmurs/rubs/gallops noted LUNGS: Lungs are clear to auscultation bilaterally, no apparent distress ABDOMEN: soft, nontender, no rebound or guarding, +BS NEURO: Pt is awake/alert, moves all extremitiesx4.  Pt using phone, no distress EXTREMITIES: pulses normal, full ROM SKIN: warm, color normal PSYCH: no abnormalities of mood noted  ED Course  Procedures     1. Abdominal pain    Pt with chronic abd pain, abd soft and no focal tenderness I doubt an acute abd process.  Advised f/u as outpatient for her chronic abd pain  The patient appears reasonably screened and/or stabilized for discharge and I doubt any other medical condition or other [facility] requiring further screening, evaluation, or treatment in the ED at this time prior to discharge.    MDM  Nursing notes reviewed and considered in documentation xrays reviewed and considered         Joya Gaskins, MD 09/05/11 2344

## 2011-09-05 NOTE — ED Notes (Signed)
Pt was seen and discharged by MD prior to RN assessment. Upon entering room to medicate pt per [facility], and RN assessment pt was was not in room. Pt left without being medicated and discharge instructions.

## 2011-09-05 NOTE — ED Notes (Signed)
Pt left without discharge instructions.

## 2011-09-05 NOTE — ED Notes (Signed)
abd pain for 6 weeks with nv.  Hx of pancreatitis

## 2011-09-06 ENCOUNTER — Inpatient Hospital Stay (HOSPITAL_COMMUNITY)
Admission: EM | Admit: 2011-09-06 | Discharge: 2011-09-10 | DRG: 440 | Disposition: A | Discharge status: Home / Self Care | Payer: Medicaid Other | Source: Other Acute Inpatient Hospital | Attending: Internal Medicine | Admitting: Internal Medicine

## 2011-09-06 ENCOUNTER — Emergency Department: Payer: Self-pay | Admitting: *Deleted

## 2011-09-06 ENCOUNTER — Inpatient Hospital Stay (HOSPITAL_COMMUNITY): Payer: Medicaid Other

## 2011-09-06 ENCOUNTER — Encounter (HOSPITAL_COMMUNITY): Payer: Self-pay | Admitting: Internal Medicine

## 2011-09-06 DIAGNOSIS — R7989 Other specified abnormal findings of blood chemistry: Secondary | ICD-10-CM | POA: Diagnosis present

## 2011-09-06 DIAGNOSIS — F339 Major depressive disorder, recurrent, unspecified: Secondary | ICD-10-CM

## 2011-09-06 DIAGNOSIS — R1011 Right upper quadrant pain: Secondary | ICD-10-CM

## 2011-09-06 DIAGNOSIS — Z9089 Acquired absence of other organs: Secondary | ICD-10-CM

## 2011-09-06 DIAGNOSIS — Z87448 Personal history of other diseases of urinary system: Secondary | ICD-10-CM

## 2011-09-06 DIAGNOSIS — K861 Other chronic pancreatitis: Secondary | ICD-10-CM | POA: Diagnosis present

## 2011-09-06 DIAGNOSIS — K219 Gastro-esophageal reflux disease without esophagitis: Secondary | ICD-10-CM

## 2011-09-06 DIAGNOSIS — Z9189 Other specified personal risk factors, not elsewhere classified: Secondary | ICD-10-CM

## 2011-09-06 DIAGNOSIS — R1110 Vomiting, unspecified: Secondary | ICD-10-CM

## 2011-09-06 DIAGNOSIS — K859 Acute pancreatitis without necrosis or infection, unspecified: Principal | ICD-10-CM | POA: Diagnosis present

## 2011-09-06 DIAGNOSIS — R112 Nausea with vomiting, unspecified: Secondary | ICD-10-CM

## 2011-09-06 DIAGNOSIS — E876 Hypokalemia: Secondary | ICD-10-CM | POA: Diagnosis present

## 2011-09-06 DIAGNOSIS — Z9079 Acquired absence of other genital organ(s): Secondary | ICD-10-CM

## 2011-09-06 DIAGNOSIS — J309 Allergic rhinitis, unspecified: Secondary | ICD-10-CM

## 2011-09-06 DIAGNOSIS — R109 Unspecified abdominal pain: Secondary | ICD-10-CM | POA: Diagnosis present

## 2011-09-06 DIAGNOSIS — G4700 Insomnia, unspecified: Secondary | ICD-10-CM

## 2011-09-06 DIAGNOSIS — G43909 Migraine, unspecified, not intractable, without status migrainosus: Secondary | ICD-10-CM

## 2011-09-06 DIAGNOSIS — D649 Anemia, unspecified: Secondary | ICD-10-CM

## 2011-09-06 DIAGNOSIS — J45909 Unspecified asthma, uncomplicated: Secondary | ICD-10-CM

## 2011-09-06 DIAGNOSIS — F6812 Factitious disorder with predominantly physical signs and symptoms: Secondary | ICD-10-CM

## 2011-09-06 DIAGNOSIS — G8929 Other chronic pain: Secondary | ICD-10-CM | POA: Diagnosis present

## 2011-09-06 DIAGNOSIS — N951 Menopausal and female climacteric states: Secondary | ICD-10-CM

## 2011-09-06 DIAGNOSIS — F17200 Nicotine dependence, unspecified, uncomplicated: Secondary | ICD-10-CM | POA: Diagnosis present

## 2011-09-06 LAB — URINALYSIS, ROUTINE W REFLEX MICROSCOPIC
Bilirubin Urine: NEGATIVE
Nitrite: NEGATIVE
Specific Gravity, Urine: 1.017 (ref 1.005–1.030)
pH: 6 (ref 5.0–8.0)

## 2011-09-06 LAB — RAPID URINE DRUG SCREEN, HOSP PERFORMED
Barbiturates: NOT DETECTED
Benzodiazepines: NOT DETECTED
Cocaine: NOT DETECTED
Opiates: POSITIVE — AB

## 2011-09-06 LAB — CBC WITH DIFFERENTIAL/PLATELET
Basophil #: 0.1 10*3/uL (ref 0.0–0.1)
Eosinophil #: 0.6 10*3/uL (ref 0.0–0.7)
Eosinophil %: 7.5 %
HGB: 12.1 g/dL (ref 12.0–16.0)
Lymphocyte #: 3.1 10*3/uL (ref 1.0–3.6)
Lymphocyte %: 37.3 %
Monocyte %: 8 %
Neutrophil %: 46.5 %
Platelet: 251 10*3/uL (ref 150–440)
RDW: 13 % (ref 11.5–14.5)
WBC: 8.3 10*3/uL (ref 3.6–11.0)

## 2011-09-06 LAB — URINALYSIS, COMPLETE
Bilirubin,UR: NEGATIVE
Blood: NEGATIVE
Nitrite: NEGATIVE
Ph: 5 (ref 4.5–8.0)
Protein: NEGATIVE
RBC,UR: 6 /HPF (ref 0–5)
WBC UR: 8 /HPF (ref 0–5)

## 2011-09-06 LAB — COMPREHENSIVE METABOLIC PANEL
ALT: 286 U/L — ABNORMAL HIGH (ref 0–35)
AST: 582 U/L — ABNORMAL HIGH (ref 0–37)
Albumin: 3.9 g/dL (ref 3.4–5.0)
Alkaline Phosphatase: 76 U/L (ref 39–117)
Anion Gap: 14 (ref 7–16)
BUN: 15 mg/dL (ref 7–18)
Bilirubin,Total: 0.2 mg/dL (ref 0.2–1.0)
CO2: 23 mEq/L (ref 19–32)
Calcium: 8.4 mg/dL (ref 8.4–10.5)
Chloride: 107 mEq/L (ref 96–112)
Creatinine: 0.73 mg/dL (ref 0.60–1.30)
EGFR (African American): >60
EGFR (Non-African Amer.): >60
GFR calc Af Amer: >90 mL/min (ref >90)
GFR calc non Af Amer: >90 mL/min (ref >90)
Glucose, Bld: 100 mg/dL — ABNORMAL HIGH (ref 70–99)
Glucose: 83 mg/dL (ref 65–99)
Potassium: 3.8 mmol/L (ref 3.5–5.1)
Potassium: 3.7 mEq/L (ref 3.5–5.1)
SGOT(AST): 50 U/L — ABNORMAL HIGH (ref 15–37)
SGPT (ALT): 65 U/L
Sodium: 140 mEq/L (ref 135–145)
Total Bilirubin: 0.6 mg/dL (ref 0.3–1.2)
Total Protein: 8 g/dL (ref 6.4–8.2)

## 2011-09-06 LAB — LIPASE, BLOOD: Lipase: 2255 U/L — ABNORMAL HIGH (ref 73–393)

## 2011-09-06 LAB — CBC
Hemoglobin: 11.3 g/dL — ABNORMAL LOW (ref 12.0–15.0)
MCH: 27.5 pg (ref 26.0–34.0)
RBC: 4.11 MIL/uL (ref 3.87–5.11)
WBC: 6.7 10*3/uL (ref 4.0–10.5)

## 2011-09-06 MED ORDER — SODIUM CHLORIDE 0.9 % IV SOLN
INTRAVENOUS | Status: DC
  Administered 2011-09-06 – 2011-09-10 (×7): via INTRAVENOUS

## 2011-09-06 MED ORDER — ACETAMINOPHEN 650 MG RE SUPP: 650.0000 mg | Freq: Four times a day (QID) | RECTAL | Status: DC | PRN

## 2011-09-06 MED ORDER — DIPHENHYDRAMINE HCL 25 MG PO CAPS
25.0000 mg | ORAL_CAPSULE | Freq: Four times a day (QID) | ORAL | Status: DC | PRN
  Administered 2011-09-07: 25 mg via ORAL
  Administered 2011-09-10: 12.5 mg via ORAL
  Filled 2011-09-06: qty 1

## 2011-09-06 MED ORDER — SODIUM CHLORIDE 0.9 % IV SOLN: INTRAVENOUS | Status: DC

## 2011-09-06 MED ORDER — HYDROMORPHONE HCL PF 1 MG/ML IJ SOLN
1.0000 mg | INTRAMUSCULAR | Status: DC | PRN
  Administered 2011-09-06: 1 mg via INTRAVENOUS
  Filled 2011-09-06: qty 1

## 2011-09-06 MED ORDER — IOHEXOL 300 MG/ML  SOLN
20.0000 mL | INTRAMUSCULAR | Status: AC
  Administered 2011-09-06 (×2): 20 mL via ORAL

## 2011-09-06 MED ORDER — FLUCONAZOLE 200 MG PO TABS
200.0000 mg | ORAL_TABLET | Freq: Once | ORAL | Status: AC
  Administered 2011-09-07: 200 mg via ORAL
  Filled 2011-09-06 (×3): qty 1

## 2011-09-06 MED ORDER — HYDROMORPHONE HCL PF 1 MG/ML IJ SOLN
1.0000 mg | INTRAMUSCULAR | Status: DC | PRN
  Administered 2011-09-06 – 2011-09-09 (×21): 2 mg via INTRAVENOUS
  Administered 2011-09-10: 1 mg via INTRAVENOUS
  Administered 2011-09-10 (×2): 2 mg via INTRAVENOUS
  Administered 2011-09-10: 1 mg via INTRAVENOUS
  Administered 2011-09-10 (×2): 2 mg via INTRAVENOUS
  Filled 2011-09-06 (×15): qty 2
  Filled 2011-09-06 (×2): qty 1
  Filled 2011-09-06 (×4): qty 2
  Filled 2011-09-06 (×2): qty 1
  Filled 2011-09-06 (×5): qty 2

## 2011-09-06 MED ORDER — ENOXAPARIN SODIUM 40 MG/0.4ML ~~LOC~~ SOLN
40.0000 mg | Freq: Every day | SUBCUTANEOUS | Status: DC
  Administered 2011-09-08 – 2011-09-10 (×3): 40 mg via SUBCUTANEOUS
  Filled 2011-09-06 (×5): qty 0.4

## 2011-09-06 MED ORDER — ESTRADIOL 2 MG PO TABS
2.0000 mg | ORAL_TABLET | Freq: Every day | ORAL | Status: DC
  Administered 2011-09-09: 2 mg via ORAL
  Filled 2011-09-06 (×5): qty 1

## 2011-09-06 MED ORDER — ONDANSETRON HCL 4 MG/2ML IJ SOLN
4.0000 mg | Freq: Four times a day (QID) | INTRAMUSCULAR | Status: DC | PRN
  Administered 2011-09-07: 4 mg via INTRAVENOUS
  Filled 2011-09-06: qty 2

## 2011-09-06 MED ORDER — ACETAMINOPHEN 325 MG PO TABS: 650.0000 mg | ORAL_TABLET | Freq: Four times a day (QID) | ORAL | Status: DC | PRN

## 2011-09-06 MED ORDER — IOHEXOL 300 MG/ML  SOLN
100.0000 mL | Freq: Once | INTRAMUSCULAR | Status: AC | PRN
  Administered 2011-09-06: 100 mL via INTRAVENOUS

## 2011-09-06 NOTE — Progress Notes (Signed)
Patient admitted to unit at 0640. Alert and oriented. C/O abdominal pain at 9/10 scale. Admissions notified. Awaiting orders.

## 2011-09-06 NOTE — Consult Note (Signed)
[facility] Consult: 2:57 PM 09/06/2011   Referring Provider:   Zannie Cove Primary Care Physician:   Primary Gastroenterologist:  [facility]: fired pt.  Dr Elnoria Howard: inpt consult: 08/09/11, Sep 2012.  Reason for Consultation:  Acute on chronic abd pain with new elevation LFTs and lipase.   HPI: [name] is a 31 y.o. female.  Innumerable ED visits and hospital admissions for abdominal pain, hx chronic pancreatits. .  Gallbladder is out.   She ended up having a laparoscopic cholecystectomy in 2008, with a normal  cholangiogram based on chronic bouts of pancreatitis of unclear etiology. The gallbladder apparently turned out to be normal. S/p exploratory laparoscopy with LOA by Dr. Carolynne Edouard in 2010. Exloratory laparotomy in 2008, diagnoses of endometriosis.    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  She never made it to referral at [facility] specialty clinic.  Had ERCP by Dr. Madilyn Fireman in 2010 with inablility to visualize the CBD.  About 1-1/2 years ago, the patient underwent EUS with celiac plexus block for pain relief by Dr. Dulce Sellar. The EUS revealed a normal common bile duct with no stone material. The pancreatic parenchyma and pancreatic duct were evaluated and there were EUS findings of chronic pancreatitis.  Unclear that the celiac block provides any pain relief.   .  On a GI eval in Sep 2012, Dr Elnoria Howard raised question of her having spincter of Odi dysfuntion.  AST/ALT then were 248/332  ANA testing in 2011 was negative.   She left hospital AMA in February 6th  2012 after 2 days hospital stay, unhappy with tapering of narcotics.    She is back in hospital with acute on chronic abdominal pain.  This time her Lipase is in 900's, c/w 160's last month.    AST/ALT are in 580's/280's c/w 40's last month.  Says the pain has been constant in epigastrum and RUQ for 6 weeks,  Repeated visits to ED for meds.  Percocet does relieve sxs but in last 2 weeks,  vomitting has made it difficult to keep her meds down.  Daily BMs.    Past Medical History  Diagnosis Date  . Pancreatitis   . S/P laparoscopic cholecystectomy   . Fibroids   . Ovarian cyst   . Chronic abdominal pain   . Anemia     Past Surgical History  Procedure Date  . Cholecystectomy   . Abdominal hysterectomy   . Dilation and curettage of uterus     Prior to Admission medications   Medication Sig Start Date End Date Taking? Authorizing Provider  estradiol (ESTRACE) 2 MG tablet Take 1 tablet (2 mg total) by mouth at bedtime. 08/04/11  Yes Wynelle Bourgeois, CNM    Scheduled Meds:    . enoxaparin  40 mg Subcutaneous Daily  . estradiol  2 mg Oral QHS  . fluconazole  200 mg Oral Once  . iohexol  20 mL Oral Q1 Hr x 2   Infusions:    . sodium chloride 100 mL/hr at 09/06/11 0748  . sodium chloride     PRN Meds: acetaminophen, acetaminophen, diphenhydrAMINE, HYDROmorphone (DILAUDID) injection, ondansetron (ZOFRAN) IV, DISCONTD:  HYDROmorphone (DILAUDID) injection   Allergies as of 09/06/2011 - Review Complete 09/06/2011  Allergen Reaction Noted  . Celecoxib Anaphylaxis and Swelling 10/28/2009  . Ketorolac tromethamine Anaphylaxis 10/28/2009  . Meperidine hcl Anaphylaxis 10/28/2009  . Fentanyl Rash 09/04/2011  . Ibuprofen Rash 07/09/2011  . Morphine Rash   . Propoxyphene n-acetaminophen Rash 10/19/2007  . Tramadol  hcl Rash 10/19/2007    Family History  Problem Relation Age of Onset  . Diabetes Mother   . Hypertension Mother   . Hypertension Father   . Diabetes Father   . Asthma Daughter     History   Social History  . Marital Status: Single    Spouse Name: N/A    Number of Children: N/A  . Years of Education: N/A   Occupational History  . Not on file.   Social History Main Topics  . Smoking status: Current Everyday Smoker -- 0.5 packs/day for 10 years    Types: Cigarettes  . Smokeless tobacco: Never Used   Comment: declined  . Alcohol Use: No    . Drug Use: No  . Sexually Active: Not on file   Other Topics Concern  . Not on file   Social History Narrative  . No narrative on file    REVIEW OF SYSTEMS: Constitutional:  No weight loss. No profound fatigue ENT:  No nosebleeds no sinus drainage Pulm:  No cough, no dyspnea on exertion CV:  No palpitations, no extremity edema, no chest pain GU:  Urine is deep yellow. No hematuria GI:  As above. No dysphagia. No hematemesis no coffee-ground emesis.  No melenic stool Heme:  No problems with iron deficiency no treatment with oral iron.    Transfusions:  none Neuro:  No syncope, no acute visual changes.  No numbness or tingling of the extremities Derm:  On occasion when the pain is bad, she will have generalized pruritus Endocrine:  No problems with diabetes, excessive thirst, sweats, chills. Gyn:  Last Pap smear is dated 08/04/2011 . Immunization:  Last documented flu shot was in 2008 Travel:  Patient doesn't travel except to [place] on occasion. She has no transportation and relies on family for car trips   PHYSICAL EXAM: Vital signs in last 24 hours: Temp:  [98.1 F (36.7 C)-98.3 F (36.8 C)] 98.1 F (36.7 C) (03/04 0646) Pulse Rate:  [78-85] 85  (03/04 0646) Resp:  [16-20] 20  (03/04 0646) BP: (117-131)/(80-90) 131/90 mmHg (03/04 0646) SpO2:  [100 %] 100 % (03/04 0646) Weight:  [183 lb (83.008 kg)] 183 lb (83.008 kg) (03/04 0640)  General: somewhat drowsy, non-ill-appearing African American female. Head:  No signs of trauma. No facial edema.  Eyes:  No conjunctival pallor, EOMI. Ears:  Not hard of hearing  Nose:  No discharge, no sinus congestion Mouth:  Oral mucosa is pink and clear, no lesions exudates Neck:  No JVD, masses or thyromegaly. Lungs:  Clear to auscultation and percussion bilaterally Heart: regular rate and rhythm with audible S1-S2. No murmurs, rubs, gallops Abdomen:  Nondistended, nonobese. Bowel sounds hypoactive but no tinkling bowel sounds and  no increased tympany no masses, no hepatosplenomegaly. She is tender most prominently in right lower quadrant and left upper quadrant but this is nonfocal. There is no guarding or rebound.   Rectal: deferred   Musc/Skeltl: no joint swelling or deformity. physical Extremities:  No pedal edema  Neurologic:  No tremor, no unilateral weakness. She moves all 4 limbs and sit up in bed without problem Skin:  No rash or lesions. Tattoos:  Several tattoos on the upper extremities, lower extremities including the feet. These number at least 6. Nodes:  No adenopathy at the groin or neck   Psych:  Pleasant, not anxious, affect a bit depressed but this may be narcotic side effect  Intake/Output from previous day:   Intake/Output this shift:  LAB RESULTS:  Basename 09/06/11 0954 09/04/11 0510  WBC 6.7 9.0  HGB 11.3* 12.3  HCT 34.2* 36.5  PLT 245 255   BMET Lab Results  Component Value Date   NA 140 09/06/2011   NA 137 09/04/2011   NA 139 09/02/2011   K 3.7 09/06/2011   K 3.8 09/04/2011   K 3.7 09/02/2011   CL 107 09/06/2011   CL 104 09/04/2011   CL 105 09/02/2011   CO2 23 09/06/2011   CO2 24 09/04/2011   CO2 24 09/02/2011   GLUCOSE 100* 09/06/2011   GLUCOSE 97 09/04/2011   GLUCOSE 90 09/02/2011   BUN 12 09/06/2011   BUN 18 09/04/2011   BUN 13 09/02/2011   CREATININE 0.63 09/06/2011   CREATININE 0.70 09/04/2011   CREATININE 0.72 09/02/2011   CALCIUM 8.4 09/06/2011   CALCIUM 9.1 09/04/2011   CALCIUM 9.3 09/02/2011   LFT  Basename 09/06/11 0954 09/04/11 0510  PROT 6.8 7.5  ALBUMIN 3.3* 3.6  AST 582* 40*  ALT 286* 49*  ALKPHOS 76 77  BILITOT 0.6 0.1*  BILIDIR -- --  IBILI -- --   PT/INR Lab Results  Component Value Date   INR 1.03 03/17/2011   INR 1.07 05/29/2009   INR 1.1 03/26/2009   Hepatitis Panel   Ref. Range 04/18/2010 16:32 03/17/2011 21:19  Hep A IgM Latest Range: NEGATIVE   NEGATIVE  Hepatitis B Surface Ag Latest Range: NEGATIVE   NEGATIVE  Hep B C IgM Latest Range: NEGATIVE  NEGATIVE...  NEGATIVE  Hep B E Ab Latest Range: Nonreactive  Nonreactive   Hep B E Ag Latest Range: Nonreactive  Nonreactive   Hepatitis B DNA Latest Range: <20 IU/mL No detectable level of HBV DNA.   Hepatitis B DNA (Calc) Latest Range: <116 copies/mL NOT CALC...   HCV Ab Latest Range: NEGATIVE   NEGATIVE    Drugs of Abuse     Component Value Date/Time   LABOPIA NEGATIVE 04/28/2011 1538   LABOPIA NONE DETECTED 03/17/2011 1727   COCAINSCRNUR NEGATIVE 04/28/2011 1538   COCAINSCRNUR NONE DETECTED 03/17/2011 1727   LABBENZ NEGATIVE 04/28/2011 1538   LABBENZ NONE DETECTED 03/17/2011 1727   AMPHETMU NEGATIVE 04/28/2011 1538   AMPHETMU NONE DETECTED 03/17/2011 1727   THCU POSITIVE* 03/17/2011 1727   LABBARB NONE DETECTED 03/17/2011 1727     RADIOLOGY STUDIES: MRCP   June 2012 IMPRESSION:   No evidence of intrahepatic or extrahepatic biliary dilatation.  No   CBD filling defects identified.   I cannot identify a connection between the ventral and dorsal   pancreatic ducts - suspicious for pancreatic divisum.    Original Report Authenticated By: Rosendo Gros, M.D.  ABD ULTRASOUND  Oct, 2010 IMPRESSION:   1.  Cholecystectomy.  Normal appearance of the common bile duct at   6 mm.   2.  Right interpolar renal cyst.    Original Report Authenticated By: Andreas Newport, M.D.  CT ABDOMEN AND PELVIS WITHOUT CONTRAST  06/11/2011 IMPRESSION:  No acute abnormalities identified within the abdomen or pelvis.  Original Report Authenticated By: Tonia Ghent, M.D   Abdominal/Pelvic CT  March 2012 IMPRESSION:   1.  No acute abdominal pelvic findings.  No evidence of   pancreatitis.   2.  Stable mild biliary dilatation status post cholecystectomy.   3.  Interval development of hepatic steatosis.   4.  Stable probable Bartholin cyst on the left.    Original Report Authenticated By: Gerrianne Scale, M.D.  MRCP  2010  ENDOSCOPIC STUDIES: FLEX SIG    April 2011   Doy Mince MD  1. Normal sigmoidoscopy  to 40 cm solid stool noted proximal to that       point.   2. Status post random biopsies to check for microscopic colitis.  Pathology:    1. COLON, BIOPSY, RANDOM : BENIGN COLONIC MUCOSA. NO SIGNIFICANT INFLAMMATION   OR OTHER ABNORMALITIES IDENTIFIED.   ERCP     Sep.  2010  Dorena Cookey MD IMPRESSION:  Normal pancreatic duct with unsuccessful attempts to   opacify the common bile duct.  PLAN:  We will obtain MR cholangiogram and consider repeat ERCP attempt  Pathology July 2010 1. RECTOSIGMOID COLON: POLYPOID COLORECTAL MUCOSA. NO   ADENOMATOUS CHANGE OR MALIGNANCY IDENTIFIED.    2. DUODENUM: BENIGN SMALL BOWEL MUCOSA. NO ACTIVE INFLAMMATION   OR VILLOUS ATROPHY IDENTIFIED.  Pathology  2009  1. COLON, BIOPSY: NO ACTIVE COLITIS OR DYSPLASIA IDENTIFIED.   BENIGN HYPERPLASTIC MUCOSAL LYMPHOID AGGREGATES AND MUCOSAL   MICROHEMORRHAGES (BIOPSIES, RANDOM)  2. RECTUM: POLYPOID BENIGN COLORECTAL MUCOSA WITH HYPERPLASTIC   MUCOSAL LYMPHOID AGGREGATES. NO ACTIVE INFLAMMATION, ADENOMATOUS   CHANGE, OR EVIDENCE OF MALIGNANCY (BIOPSIES, POLYP)     IMPRESSION: 1.  Acute on chronic abdominal pain particularly at the epigastrium and left upper quadrant going on now for 6 weeks. Early in February she had mild elevation of her lipase, now is measuring in the 900. Along with this lipase elevation is elevation of her transaminases which were minimally abnormal one month ago,now markedly elevated.  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  A CT scan of the abdomen and pelvis with contrast has been ordered by the hospitalist.  Incidentally, hepatitis serology testing on several occasions, including September 2012, has been repeatedly negative. ANA has been negative as well on prior testing    PLAN: 1.  Repeat LFTs in AM.  NPO.  Await repeat CT scan.  No plans for endoscopic evaluation at this time.     LOS: 0 days   Jennye Moccasin  09/06/2011, 2:57 PM Pager: [phone]  GI ATTENDING  HX,  LABS, X-RAYS, PRIOR ENDOSCOPY REPORTS REVIEWED. PATIENT SEEN AND EXAMINED. AGREE WITH ABOVE H&P. ASKED TO SEE FOR ABDOMINAL PAIN. HAS SEEN MULTIPLE PRIOR GI MD'S FOR THE SAME. CARRIES THE DX OF CHRONIC PAIN OF UNCERTAIN CAUSE AND RECURRENT PANCREATITIS OF UNCERTAIN CAUSE. NOW WITH SIGNIFICANT ELEVATION OF LFT'S AND LIPASE. STSTUS POST REMOTE CHOLECYSTECTOMY. PHYSICAL EXAM IS QUITE BENIGN AND CT SHOWS A NORMAL PANCREAS (ACTUALLY UNREMARKABLE SCAN). PRIOR ERCP WITH NORMAL PD MITIGATES AGAINST PANCREAS DIVISUM. COULD HAVE SPHINCTER OF ODDI DYSFUNCTION, AS SUGGESTED BY DR HUNG, WHICH NEEDS TO BE EVALUATED AT A TERTIARY CARE CENTER (I'M TOLD THAT SHE HAS FAILED TO KEEP MULTIPLE APPOINTMENTS)... RECOMMEND (1) IVF, (2) PAIN CONTROL, (3) FOLLOW LABS, (4) ADVANCE DIET AS TOLERATED, AND (5) OUTPATIENT TERTIARY CARE GI/BILIARY EVALUATION TO R/O SOD. WILL FOLLOW. THANK YOU.  Wilhemina Bonito. Eda Keys., M.D. [facility] Division of Gastroenterology

## 2011-09-06 NOTE — Progress Notes (Signed)
Pt was assessed by Dr Jomarie Longs this am, new orders included tylenol for pain.  Pt upset MD did not increase dilaudid dose, is refusing any meds. States she wants to see another doctor because she needs stronger pain meds. Text message sent to Dr Jomarie Longs informing her of patients desires.

## 2011-09-06 NOTE — H&P (Signed)
PCP: [facility]ologist: None  Chief Complaint:  Abdominal pain  HPI: [name] is a 31 year old female with history of chronic pancreatitis, chronic abdominal pain, history of Manchausen's syndrome per records, was accepted in transfer by one of my partners early this morning from an outside hospital with abdominal pain and abnormal LFTs. Patient reports increasing abdominal pain since last night, describes the pain as sharp located, located in the epigastrium, reports nausea with this. She was hospitalized in early February for similar problems and left AMA, has been in the emergency room very frequently with abdominal pain as well. 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 She denies fevers or chills, denies diarrhea. Reports itching and is requesting Benadryl.  Allergies:   Allergies  Allergen Reactions  . Celecoxib Anaphylaxis and Swelling  . Ketorolac Tromethamine Anaphylaxis  . Meperidine Hcl Anaphylaxis  . Fentanyl Rash  . Ibuprofen Rash  . Morphine Rash  . Propoxyphene N-Acetaminophen Rash  . Tramadol Hcl Rash      Past Medical History  Diagnosis Date  . Pancreatitis   . S/P laparoscopic cholecystectomy   . Fibroids   . Ovarian cyst   . Chronic abdominal pain   . Anemia     Past Surgical History  Procedure Date  . Cholecystectomy   . Abdominal hysterectomy   . Dilation and curettage of uterus     Prior to Admission medications   Medication Sig Start Date End Date Taking? Authorizing Provider  estradiol (ESTRACE) 2 MG tablet Take 1 tablet (2 mg total) by mouth at bedtime. 08/04/11  Yes Wynelle Bourgeois, CNM    Social History:  reports that she has been smoking Cigarettes.  She has a 5 pack-year smoking history. She has never used smokeless tobacco. She reports that she does not drink alcohol or use illicit drugs.  Family History  Problem Relation Age of Onset  . Diabetes Mother   . Hypertension Mother   .  Hypertension Father   . Diabetes Father   . Asthma Daughter     Review of Systems:  Constitutional: Denies fever, chills, diaphoresis, appetite change and fatigue.  HEENT: Denies photophobia, eye pain, redness, hearing loss, ear pain, congestion, sore throat, rhinorrhea, sneezing, mouth sores, trouble swallowing, neck pain, neck stiffness and tinnitus.   Respiratory: Denies SOB, DOE, cough, chest tightness,  and wheezing.   Cardiovascular: Denies chest pain, palpitations and leg swelling.  Gastrointestinal: Denies nausea, vomiting, abdominal pain, diarrhea, constipation, blood in stool and abdominal distention.  Genitourinary: Denies dysuria, urgency, frequency, hematuria, flank pain and difficulty urinating.  Musculoskeletal: Denies myalgias, back pain, joint swelling, arthralgias and gait problem.  Skin: Denies pallor, rash and wound.  Neurological: Denies dizziness, seizures, syncope, weakness, light-headedness, numbness and headaches.  Hematological: Denies adenopathy. Easy bruising, personal or family bleeding history  Psychiatric/Behavioral: Denies suicidal ideation, mood changes, confusion, nervousness, sleep disturbance and agitation   Physical Exam: Blood pressure 131/90, pulse 85, temperature 98.1 F (36.7 C), resp. rate 20, height 5\' 5"  (1.651 m), weight 83.008 kg (183 lb), last menstrual period 06/01/2011, SpO2 100.00%. Gen.: Alert awake oriented x3 no acute distress HEENT pupils equal reactive to light oral mucosa moist no icterus CVS S1-S2 regular rate rhythm no murmurs rubs or gallops Lungs clear to auscultation bilaterally Abdomen is soft mild epigastric tenderness no rigidity no rebound no flank tenderness Extremities no edema clubbing or Neuro :Moves all extremities no localizing signs  Labs on Admission:  Results for orders placed during  the hospital encounter of 09/06/11 (from the past 48 hour(s))  MRSA PCR SCREENING     Status: Normal   Collection Time   09/06/11   7:04 AM      Component Value Range Comment   MRSA by PCR NEGATIVE  NEGATIVE    CBC     Status: Abnormal   Collection Time   09/06/11  9:54 AM      Component Value Range Comment   WBC 6.7  4.0 - 10.5 (K/uL)    RBC 4.11  3.87 - 5.11 (MIL/uL)    Hemoglobin 11.3 (*) 12.0 - 15.0 (g/dL)    HCT 47.8 (*) 29.5 - 46.0 (%)    MCV 83.2  78.0 - 100.0 (fL)    MCH 27.5  26.0 - 34.0 (pg)    MCHC 33.0  30.0 - 36.0 (g/dL)    RDW 62.1  30.8 - 65.7 (%)    Platelets 245  150 - 400 (K/uL)   COMPREHENSIVE METABOLIC PANEL     Status: Abnormal   Collection Time   09/06/11  9:54 AM      Component Value Range Comment   Sodium 140  135 - 145 (mEq/L)    Potassium 3.7  3.5 - 5.1 (mEq/L)    Chloride 107  96 - 112 (mEq/L)    CO2 23  19 - 32 (mEq/L)    Glucose, Bld 100 (*) 70 - 99 (mg/dL)    BUN 12  6 - 23 (mg/dL)    Creatinine, Ser 8.46  0.50 - 1.10 (mg/dL)    Calcium 8.4  8.4 - 10.5 (mg/dL)    Total Protein 6.8  6.0 - 8.3 (g/dL)    Albumin 3.3 (*) 3.5 - 5.2 (g/dL)    AST 962 (*) 0 - 37 (U/L)    ALT 286 (*) 0 - 35 (U/L)    Alkaline Phosphatase 76  39 - 117 (U/L)    Total Bilirubin 0.6  0.3 - 1.2 (mg/dL)    GFR calc non Af Amer >90  >90 (mL/min)    GFR calc Af Amer >90  >90 (mL/min)   LIPASE, BLOOD     Status: Abnormal   Collection Time   09/06/11  9:54 AM      Component Value Range Comment   Lipase 902 (*) 11 - 59 (U/L)     Radiological Exams on Admission: No results found.  Assessment/Plan 1. acute on chronic pancreatitis 2. abnormal LFTs 3. chronic abdominal pain 4. history of Munchausen's syndrome per records 5. Tobacco use Plan: N.p.o., IV fluids, antiemetics and narcotics MRCP in 12/2010 suspicious for pancreas divisum Requests unassigned GI consultation avoid excessive narcotics based on prior documented behavior     Time Spent on Admission:  Tarea Skillman [facility] Pager: [phone] 09/06/2011, 2:59 PM

## 2011-09-06 NOTE — Progress Notes (Signed)
Utilization Review Completed.  [name]  09/06/2011  

## 2011-09-07 LAB — COMPREHENSIVE METABOLIC PANEL
BUN: 6 mg/dL (ref 6–23)
CO2: 25 mEq/L (ref 19–32)
Calcium: 9.3 mg/dL (ref 8.4–10.5)
Creatinine, Ser: 0.63 mg/dL (ref 0.50–1.10)
GFR calc Af Amer: >90 mL/min (ref >90)
GFR calc non Af Amer: >90 mL/min (ref >90)
Glucose, Bld: 77 mg/dL (ref 70–99)
Total Bilirubin: 0.6 mg/dL (ref 0.3–1.2)

## 2011-09-07 LAB — LIPASE, BLOOD: Lipase: 56 U/L (ref 11–59)

## 2011-09-07 MED ORDER — ESTRADIOL 2 MG PO TABS
2.0000 mg | ORAL_TABLET | Freq: Once | ORAL | Status: AC
  Administered 2011-09-07: 2 mg via ORAL
  Filled 2011-09-07: qty 1

## 2011-09-07 MED ORDER — PANTOPRAZOLE SODIUM 40 MG PO TBEC
40.0000 mg | DELAYED_RELEASE_TABLET | Freq: Every day | ORAL | Status: DC
  Administered 2011-09-08 – 2011-09-09 (×2): 40 mg via ORAL
  Filled 2011-09-07 (×3): qty 1

## 2011-09-07 NOTE — Progress Notes (Signed)
[facility] Daily Rounding Note 09/07/2011, 9:16 AM  SUBJECTIVE:       Taking PRN Dilaudid as frequently as she can forongoing abdominal pain.  Some nausea  OBJECTIVE:        General: Drowsy non-ill appearing     Vital signs in last 24 hours:    Temp:  [97.5 F (36.4 C)-97.9 F (36.6 C)] 97.9 F (36.6 C) (03/05 0553) Pulse Rate:  [81-92] 92  (03/05 0553) Resp:  [18-20] 18  (03/05 0553) BP: (120-126)/(72-83) 126/72 mmHg (03/05 0553) SpO2:  [93 %-100 %] 93 % (03/05 0553) Last BM Date: 09/06/11  Heart: RRR Chest: Clear B.   Abdomen: Soft, ND.  BS active.  Tender left UQ  Extremities: no pedal edema Neuro/Psych:  "narcotized", falling asleep during exam, but arouses easily.  No tremor, no agitation.   Intake/Output from previous day: 03/04 0701 - 03/05 0700 In: 2421.7 [I.V.:2421.7] Out: 400 [Urine:400]  Intake/Output this shift:    Lab Results:  Basename 09/06/11 0954  WBC 6.7  HGB 11.3*  HCT 34.2*  PLT 245   BMET  Basename 09/07/11 0510 09/06/11 0954  NA 140 140  K 3.8 3.7  CL 105 107  CO2 25 23  GLUCOSE 77 100*  BUN 6 12  CREATININE 0.63 0.63  CALCIUM 9.3 8.4   LFT  Basename 09/07/11 0510 09/06/11 0954  PROT 7.1 6.8  ALBUMIN 3.5 3.3*  AST 281* 582*  ALT 359* 286*  ALKPHOS 93 76  BILITOT 0.6 0.6  BILIDIR -- --  IBILI -- --   Studies/Results: Ct Abdomen Pelvis W Contrast  09/06/2011  *RADIOLOGY REPORT*  Clinical Data: Abnormal LFTs.  Pancreatitis.  CT ABDOMEN AND PELVIS WITH CONTRAST  Technique:  Multidetector CT imaging of the abdomen and pelvis was performed following the standard protocol during bolus administration of intravenous contrast.  Contrast: OMNIPAQUE IOHEXOL 300 MG/ML IJ SOLN  Comparison: 06/11/2011  Findings: No focal abnormalities seen in the liver or spleen.  The stomach, duodenum, pancreas, and adrenal glands are unremarkable. 14 mm well-defined low density lesion in the right kidney is likely a cyst.  Left kidney is  unremarkable.  No abdominal aortic aneurysm.  There is no free fluid or lymphadenopathy in the abdomen.  Imaging through the pelvis shows no free intraperitoneal fluid.  No pelvic sidewall lymphadenopathy.  The bladder is normal.  Uterus is surgically absent.  No evidence for adnexal mass.  No substantial diverticular change in the colon.  No colonic diverticulitis. Cecal tip is low in the right pelvis.  Neither the terminal ileum nor the appendix are well seen.  There is no evidence for edema or inflammation around the cecum.  Bone windows reveal no worrisome lytic or sclerotic osseous lesions.  IMPRESSION: Stable exam.  No new or acute interval findings.  Original Report Authenticated By: ERIC A. MANSELL, M.D.    ASSESMENT: 1. Acute on chronic abdominal pain particularly at the epigastrium and left upper quadrant.  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   LFTs improved. CT scan negative for pancreatic disease.     PLAN: 1.  Can trial diet, she will not take the broth but will take frozen ice treats.  I wrote orders.  2.  Emphasized to patient importance of arranging transportation to GI docs at [facility] who have expertise in diagnosing and treating SOD   LOS: 1 day   Jennye Moccasin  09/07/2011, 9:16 AM  Pager: [phone]   GI ATTENDING  SEEN AND EXAMINED. AGREE WITH ABOVE. LOOKS WELL TEXTING ON CELL PHONE. ABDOMEN IS BENIGN. SHE WANTS IV BENADRYL INSTEAD OF PO. LABS IMPROVING.  RECOMMEND:  1. ADVANCE DIET AS TOLERATED                            2. CONTINUE TO MONITOR LABS ( EXPECT ONGOING IMPROVEMENT)                            3. PAIN CONTROL AND OTHER SYMPTOMATIC THERAPIES PER PRIMARY SERVICE                            4. SHE WAS ADVISED TO FOLLOW UP WITH BILIARY GROUP AT [facility] FOR ASSESSMENT OF POSSIBLE SOD.  CALL FOR QUESTIONS. WILL SIGN OFF.  Wilhemina Bonito. Eda Keys., M.D. [facility] Division of Gastroenterology

## 2011-09-07 NOTE — Progress Notes (Signed)
Subjective: Sleeping when i walked in, reports severe abdominal pain  Objective: Vital signs in last 24 hours: Temp:  [97.5 F (36.4 C)-97.9 F (36.6 C)] 97.9 F (36.6 C) (03/05 0553) Pulse Rate:  [81-92] 92  (03/05 0553) Resp:  [18-20] 18  (03/05 0553) BP: (120-126)/(72-83) 126/72 mmHg (03/05 0553) SpO2:  [93 %-100 %] 93 % (03/05 0553) Weight change:  Last BM Date: 09/06/11  Intake/Output from previous day: 03/04 0701 - 03/05 0700 In: 2421.7 [I.V.:2421.7] Out: 400 [Urine:400]     Physical Exam: General: Alert, awake, oriented x3, in no acute distress. HEENT: No bruits, no goiter. Heart: Regular rate and rhythm, without murmurs, rubs, gallops. Lungs: Clear to auscultation bilaterally. Abdomen: Soft, mild epigastric tenderness, nondistended, positive bowel sounds. Extremities: No clubbing cyanosis or edema with positive pedal pulses. Neuro: Grossly intact, nonfocal.    Lab Results: Basic Metabolic Panel:  Basename 09/07/11 0510 09/06/11 0954  NA 140 140  K 3.8 3.7  CL 105 107  CO2 25 23  GLUCOSE 77 100*  BUN 6 12  CREATININE 0.63 0.63  CALCIUM 9.3 8.4  MG -- --  PHOS -- --   Liver Function Tests:  Basename 09/07/11 0510 09/06/11 0954  AST 281* 582*  ALT 359* 286*  ALKPHOS 93 76  BILITOT 0.6 0.6  PROT 7.1 6.8  ALBUMIN 3.5 3.3*    Basename 09/07/11 0510 09/06/11 0954  LIPASE 56 902*  AMYLASE -- --   No results found for this basename: AMMONIA:2 in the last 72 hours CBC:  Basename 09/06/11 0954  WBC 6.7  NEUTROABS --  HGB 11.3*  HCT 34.2*  MCV 83.2  PLT 245   Cardiac Enzymes: No results found for this basename: CKTOTAL:3,CKMB:3,CKMBINDEX:3,TROPONINI:3 in the last 72 hours BNP: No results found for this basename: PROBNP:3 in the last 72 hours D-Dimer: No results found for this basename: DDIMER:2 in the last 72 hours CBG: No results found for this basename: GLUCAP:6 in the last 72 hours Hemoglobin A1C: No results found for this basename:  HGBA1C in the last 72 hours Fasting Lipid Panel: No results found for this basename: CHOL,HDL,LDLCALC,TRIG,CHOLHDL,LDLDIRECT in the last 72 hours Thyroid Function Tests: No results found for this basename: TSH,T4TOTAL,FREET4,T3FREE,THYROIDAB in the last 72 hours Anemia Panel: No results found for this basename: VITAMINB12,FOLATE,FERRITIN,TIBC,IRON,RETICCTPCT in the last 72 hours Coagulation: No results found for this basename: LABPROT:2,INR:2 in the last 72 hours Urine Drug Screen: Drugs of Abuse     Component Value Date/Time   LABOPIA POSITIVE* 09/06/2011 1714   LABOPIA NEGATIVE 04/28/2011 1538   COCAINSCRNUR NONE DETECTED 09/06/2011 1714   COCAINSCRNUR NEGATIVE 04/28/2011 1538   LABBENZ NONE DETECTED 09/06/2011 1714   LABBENZ NEGATIVE 04/28/2011 1538   AMPHETMU NONE DETECTED 09/06/2011 1714   AMPHETMU NEGATIVE 04/28/2011 1538   THCU POSITIVE* 09/06/2011 1714   LABBARB NONE DETECTED 09/06/2011 1714    Alcohol Level: No results found for this basename: ETH:2 in the last 72 hours Urinalysis:  Basename 09/06/11 1714  COLORURINE YELLOW  LABSPEC 1.017  PHURINE 6.0  GLUCOSEU NEGATIVE  HGBUR NEGATIVE  BILIRUBINUR NEGATIVE  KETONESUR NEGATIVE  PROTEINUR NEGATIVE  UROBILINOGEN 0.2  NITRITE NEGATIVE  LEUKOCYTESUR NEGATIVE    Recent Results (from the past 240 hour(s))  MRSA PCR SCREENING     Status: Normal   Collection Time   09/06/11  7:04 AM      Component Value Range Status Comment   MRSA by PCR NEGATIVE  NEGATIVE  Final     Studies/Results: Ct  Abdomen Pelvis W Contrast  09/06/2011  *RADIOLOGY REPORT*  Clinical Data: Abnormal LFTs.  Pancreatitis.  CT ABDOMEN AND PELVIS WITH CONTRAST  Technique:  Multidetector CT imaging of the abdomen and pelvis was performed following the standard protocol during bolus administration of intravenous contrast.  Contrast: OMNIPAQUE IOHEXOL 300 MG/ML IJ SOLN  Comparison: 06/11/2011  Findings: No focal abnormalities seen in the liver or spleen.  The  stomach, duodenum, pancreas, and adrenal glands are unremarkable. 14 mm well-defined low density lesion in the right kidney is likely a cyst.  Left kidney is unremarkable.  No abdominal aortic aneurysm.  There is no free fluid or lymphadenopathy in the abdomen.  Imaging through the pelvis shows no free intraperitoneal fluid.  No pelvic sidewall lymphadenopathy.  The bladder is normal.  Uterus is surgically absent.  No evidence for adnexal mass.  No substantial diverticular change in the colon.  No colonic diverticulitis. Cecal tip is low in the right pelvis.  Neither the terminal ileum nor the appendix are well seen.  There is no evidence for edema or inflammation around the cecum.  Bone windows reveal no worrisome lytic or sclerotic osseous lesions.  IMPRESSION: Stable exam.  No new or acute interval findings.  Original Report Authenticated By: ERIC A. MANSELL, M.D.    Medications: Scheduled Meds:   . enoxaparin  40 mg Subcutaneous Daily  . estradiol  2 mg Oral QHS  . estradiol  2 mg Oral Once  . fluconazole  200 mg Oral Once  . iohexol  20 mL Oral Q1 Hr x 2   Continuous Infusions:   . sodium chloride 100 mL/hr at 09/07/11 0208  . DISCONTD: sodium chloride     PRN Meds:.acetaminophen, acetaminophen, diphenhydrAMINE, HYDROmorphone (DILAUDID) injection, iohexol, ondansetron (ZOFRAN) IV, DISCONTD:  HYDROmorphone (DILAUDID) injection  Assessment/Plan: 1. 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 H/o MUNCHAUSEN'S SYNDROME: per prior documentation 3. Tobacco use: counselled    LOS: 1 day   [facility] Pager: [phone] 09/07/2011, 11:14 AM

## 2011-09-08 LAB — COMPREHENSIVE METABOLIC PANEL
ALT: 243 U/L — ABNORMAL HIGH (ref 0–35)
Albumin: 3.6 g/dL (ref 3.5–5.2)
Alkaline Phosphatase: 97 U/L (ref 39–117)
Calcium: 9 mg/dL (ref 8.4–10.5)
Potassium: 3.3 mEq/L — ABNORMAL LOW (ref 3.5–5.1)
Sodium: 139 mEq/L (ref 135–145)
Total Protein: 7.3 g/dL (ref 6.0–8.3)

## 2011-09-08 MED ORDER — POTASSIUM CHLORIDE CRYS ER 20 MEQ PO TBCR
40.0000 meq | EXTENDED_RELEASE_TABLET | ORAL | Status: AC
  Administered 2011-09-08 (×2): 40 meq via ORAL
  Filled 2011-09-08 (×2): qty 2

## 2011-09-08 MED ORDER — DIPHENHYDRAMINE HCL 50 MG/ML IJ SOLN
12.5000 mg | Freq: Four times a day (QID) | INTRAMUSCULAR | Status: DC | PRN
  Administered 2011-09-08 (×2): 12.5 mg via INTRAVENOUS
  Administered 2011-09-08: 10:00:00 via INTRAVENOUS
  Administered 2011-09-09 – 2011-09-10 (×6): 12.5 mg via INTRAVENOUS
  Filled 2011-09-08 (×7): qty 1

## 2011-09-08 NOTE — Progress Notes (Signed)
Subjective: States with abdominal pain, +nausea. C/o pruritis and requesting IV benadryl  Objective: Vital signs in last 24 hours: Temp:  [97.7 F (36.5 C)-98.6 F (37 C)] 98.6 F (37 C) (03/06 1355) Pulse Rate:  [74-113] 75  (03/06 1355) Resp:  [16-18] 16  (03/06 1355) BP: (125-140)/(80-95) 132/82 mmHg (03/06 1355) SpO2:  [88 %-100 %] 88 % (03/06 1355) Weight change:  Last BM Date: 09/07/11  Intake/Output from previous day: 03/05 0701 - 03/06 0700 In: 2480 [P.O.:480; I.V.:2000] Out: -      Physical Exam: General: Alert, awake, oriented x3, in no acute distress. HEENT: No bruits, no goiter. Heart: Regular rate and rhythm, without murmurs, rubs, gallops. Lungs: Clear to auscultation bilaterally. Abdomen: Soft, epigastric and LUQ tenderness  present, nondistended, positive bowel sounds. Extremities: No clubbing cyanosis or edema with positive pedal pulses. Neuro: Grossly intact, nonfocal.    Lab Results: Basic Metabolic Panel:  Basename 09/08/11 0610 09/07/11 0510  NA 139 140  K 3.3* 3.8  CL 105 105  CO2 25 25  GLUCOSE 104* 77  BUN 4* 6  CREATININE 0.58 0.63  CALCIUM 9.0 9.3  MG -- --  PHOS -- --   Liver Function Tests:  [facility] 09/08/11 0610 09/07/11 0510  AST 129* 281*  ALT 243* 359*  ALKPHOS 97 93  BILITOT 0.4 0.6  PROT 7.3 7.1  ALBUMIN 3.6 3.5    Basename 09/08/11 0610 09/07/11 0510  LIPASE 123* 56  AMYLASE -- --   No results found for this basename: AMMONIA:2 in the last 72 hours CBC:  Basename 09/06/11 0954  WBC 6.7  NEUTROABS --  HGB 11.3*  HCT 34.2*  MCV 83.2  PLT 245   Cardiac Enzymes: No results found for this basename: CKTOTAL:3,CKMB:3,CKMBINDEX:3,TROPONINI:3 in the last 72 hours BNP: No results found for this basename: PROBNP:3 in the last 72 hours D-Dimer: No results found for this basename: DDIMER:2 in the last 72 hours CBG: No results found for this basename: GLUCAP:6 in the last 72 hours Hemoglobin A1C: No results  found for this basename: HGBA1C in the last 72 hours Fasting Lipid Panel: No results found for this basename: CHOL,HDL,LDLCALC,TRIG,CHOLHDL,LDLDIRECT in the last 72 hours Thyroid Function Tests: No results found for this basename: TSH,T4TOTAL,FREET4,T3FREE,THYROIDAB in the last 72 hours Anemia Panel: No results found for this basename: VITAMINB12,FOLATE,FERRITIN,TIBC,IRON,RETICCTPCT in the last 72 hours Coagulation: No results found for this basename: LABPROT:2,INR:2 in the last 72 hours Urine Drug Screen: Drugs of Abuse     Component Value Date/Time   LABOPIA POSITIVE* 09/06/2011 1714   LABOPIA NEGATIVE 04/28/2011 1538   COCAINSCRNUR NONE DETECTED 09/06/2011 1714   COCAINSCRNUR NEGATIVE 04/28/2011 1538   LABBENZ NONE DETECTED 09/06/2011 1714   LABBENZ NEGATIVE 04/28/2011 1538   AMPHETMU NONE DETECTED 09/06/2011 1714   AMPHETMU NEGATIVE 04/28/2011 1538   THCU POSITIVE* 09/06/2011 1714   LABBARB NONE DETECTED 09/06/2011 1714    Alcohol Level: No results found for this basename: ETH:2 in the last 72 hours Urinalysis:  Basename 09/06/11 1714  COLORURINE YELLOW  LABSPEC 1.017  PHURINE 6.0  GLUCOSEU NEGATIVE  HGBUR NEGATIVE  BILIRUBINUR NEGATIVE  KETONESUR NEGATIVE  PROTEINUR NEGATIVE  UROBILINOGEN 0.2  NITRITE NEGATIVE  LEUKOCYTESUR NEGATIVE    Recent Results (from the past 240 hour(s))  MRSA PCR SCREENING     Status: Normal   Collection Time   09/06/11  7:04 AM      Component Value Range Status Comment   MRSA by PCR NEGATIVE  NEGATIVE  Final  Studies/Results: Ct Abdomen Pelvis W Contrast  09/06/2011  *RADIOLOGY REPORT*  Clinical Data: Abnormal LFTs.  Pancreatitis.  CT ABDOMEN AND PELVIS WITH CONTRAST  Technique:  Multidetector CT imaging of the abdomen and pelvis was performed following the standard protocol during bolus administration of intravenous contrast.  Contrast: OMNIPAQUE IOHEXOL 300 MG/ML IJ SOLN  Comparison: 06/11/2011  Findings: No focal abnormalities seen in  the liver or spleen.  The stomach, duodenum, pancreas, and adrenal glands are unremarkable. 14 mm well-defined low density lesion in the right kidney is likely a cyst.  Left kidney is unremarkable.  No abdominal aortic aneurysm.  There is no free fluid or lymphadenopathy in the abdomen.  Imaging through the pelvis shows no free intraperitoneal fluid.  No pelvic sidewall lymphadenopathy.  The bladder is normal.  Uterus is surgically absent.  No evidence for adnexal mass.  No substantial diverticular change in the colon.  No colonic diverticulitis. Cecal tip is low in the right pelvis.  Neither the terminal ileum nor the appendix are well seen.  There is no evidence for edema or inflammation around the cecum.  Bone windows reveal no worrisome lytic or sclerotic osseous lesions.  IMPRESSION: Stable exam.  No new or acute interval findings.  Original Report Authenticated By: ERIC A. MANSELL, M.D.    Medications: Scheduled Meds:    . enoxaparin  40 mg Subcutaneous Daily  . estradiol  2 mg Oral QHS  . pantoprazole  40 mg Oral Q1200  . potassium chloride  40 mEq Oral Q4H   Continuous Infusions:    . sodium chloride 100 mL/hr at 09/08/11 0500   PRN Meds:.acetaminophen, acetaminophen, diphenhydrAMINE, diphenhydrAMINE, HYDROmorphone (DILAUDID) injection, ondansetron (ZOFRAN) IV  Assessment/Plan: 1. 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 2. H/o MUNCHAUSEN'S SYNDROME: per prior documentation 3. Tobacco use: counseled to quit 4. Hypokalemia -replace k   LOS: 2 days   Kela Millin [facility] Pager: [phone] 09/08/2011, 4:45 PM

## 2011-09-08 NOTE — Progress Notes (Signed)
Pt admitted for recurrent RUQ abdominal pain, intermittent nausea with dry heaves. Pt declines antiemetics at this time. Pt not seen to dry heave. Pt is tolerating clear liquid diet without difficulty. Pt abdomen is soft and flat with BS+x4. Pt repts passing gas. Pt repts LBM today. Pt is alert and oriented x 3. Pt voids quantity sufficient without difficulty. Lungs CTA. Heart rate regular rate and rhythm. No s/sx cardiac or resp distress and no c/o such. Vital signs stable. Pt ambulates with steady gait. Pt repts that she has a "cold" and has been coughing up green with bld tinged phlegm. Pt declines IS at this time. Upon assessment, pt is resting in bed with no visible s/sx of distress or discomfort.

## 2011-09-09 LAB — HEPATIC FUNCTION PANEL
ALT: 165 U/L — ABNORMAL HIGH (ref 0–35)
AST: 65 U/L — ABNORMAL HIGH (ref 0–37)
Albumin: 3.3 g/dL — ABNORMAL LOW (ref 3.5–5.2)
Bilirubin, Direct: <0.1 mg/dL (ref 0.0–0.3)
Total Bilirubin: 0.3 mg/dL (ref 0.3–1.2)

## 2011-09-09 LAB — BASIC METABOLIC PANEL
BUN: 3 mg/dL — ABNORMAL LOW (ref 6–23)
CO2: 26 mEq/L (ref 19–32)
Chloride: 104 mEq/L (ref 96–112)
Creatinine, Ser: 0.6 mg/dL (ref 0.50–1.10)
Potassium: 3.8 mEq/L (ref 3.5–5.1)

## 2011-09-09 LAB — LIPASE, BLOOD: Lipase: 43 U/L (ref 11–59)

## 2011-09-09 NOTE — Progress Notes (Signed)
Subjective: States nausea better, still some abdominal pain, wants to try solids.  Objective: Vital signs in last 24 hours: Temp:  [97.9 F (36.6 C)-98.4 F (36.9 C)] 98.3 F (36.8 C) (03/07 1300) Pulse Rate:  [80-94] 83  (03/07 1300) Resp:  [18] 18  (03/07 1300) BP: (117-135)/(64-77) 117/74 mmHg (03/07 1300) SpO2:  [95 %-97 %] 95 % (03/07 1300) Weight change:  Last BM Date: 09/08/11  Intake/Output from previous day: 03/06 0701 - 03/07 0700 In: 2200 [I.V.:2200] Out: -      Physical Exam: General: Alert, awake, oriented x3, in no acute distress. HEENT: No bruits, no goiter. Heart: Regular rate and rhythm, without murmurs, rubs, gallops. Lungs: Clear to auscultation bilaterally. Abdomen: Soft, decreased epigastric and LUQ tenderness  present, nondistended, positive bowel sounds. Extremities: No clubbing cyanosis or edema with positive pedal pulses. Neuro: Grossly intact, nonfocal.    Lab Results: Basic Metabolic Panel:  Basename 09/09/11 0652 09/08/11 0610  NA 138 139  K 3.8 3.3*  CL 104 105  CO2 26 25  GLUCOSE 134* 104*  BUN 3* 4*  CREATININE 0.60 0.58  CALCIUM 8.8 9.0  MG -- --  PHOS -- --   Liver Function Tests:  [facility] 09/09/11 0652 09/08/11 0610  AST 65* 129*  ALT 165* 243*  ALKPHOS 87 97  BILITOT 0.3 0.4  PROT 7.2 7.3  ALBUMIN 3.3* 3.6    Basename 09/09/11 0652 09/08/11 0610  LIPASE 43 123*  AMYLASE -- --   No results found for this basename: AMMONIA:2 in the last 72 hours CBC: No results found for this basename: WBC:2,NEUTROABS:2,HGB:2,HCT:2,MCV:2,PLT:2 in the last 72 hours Cardiac Enzymes: No results found for this basename: CKTOTAL:3,CKMB:3,CKMBINDEX:3,TROPONINI:3 in the last 72 hours BNP: No results found for this basename: PROBNP:3 in the last 72 hours D-Dimer: No results found for this basename: DDIMER:2 in the last 72 hours CBG: No results found for this basename: GLUCAP:6 in the last 72 hours Hemoglobin A1C: No results found  for this basename: HGBA1C in the last 72 hours Fasting Lipid Panel: No results found for this basename: CHOL,HDL,LDLCALC,TRIG,CHOLHDL,LDLDIRECT in the last 72 hours Thyroid Function Tests: No results found for this basename: TSH,T4TOTAL,FREET4,T3FREE,THYROIDAB in the last 72 hours Anemia Panel: No results found for this basename: VITAMINB12,FOLATE,FERRITIN,TIBC,IRON,RETICCTPCT in the last 72 hours Coagulation: No results found for this basename: LABPROT:2,INR:2 in the last 72 hours Urine Drug Screen: Drugs of Abuse     Component Value Date/Time   LABOPIA POSITIVE* 09/06/2011 1714   LABOPIA NEGATIVE 04/28/2011 1538   COCAINSCRNUR NONE DETECTED 09/06/2011 1714   COCAINSCRNUR NEGATIVE 04/28/2011 1538   LABBENZ NONE DETECTED 09/06/2011 1714   LABBENZ NEGATIVE 04/28/2011 1538   AMPHETMU NONE DETECTED 09/06/2011 1714   AMPHETMU NEGATIVE 04/28/2011 1538   THCU POSITIVE* 09/06/2011 1714   LABBARB NONE DETECTED 09/06/2011 1714    Alcohol Level: No results found for this basename: ETH:2 in the last 72 hours Urinalysis:  Basename 09/06/11 1714  COLORURINE YELLOW  LABSPEC 1.017  PHURINE 6.0  GLUCOSEU NEGATIVE  HGBUR NEGATIVE  BILIRUBINUR NEGATIVE  KETONESUR NEGATIVE  PROTEINUR NEGATIVE  UROBILINOGEN 0.2  NITRITE NEGATIVE  LEUKOCYTESUR NEGATIVE    Recent Results (from the past 240 hour(s))  MRSA PCR SCREENING     Status: Normal   Collection Time   09/06/11  7:04 AM      Component Value Range Status Comment   MRSA by PCR NEGATIVE  NEGATIVE  Final     Studies/Results: No results found.  Medications: Scheduled Meds:    .  enoxaparin  40 mg Subcutaneous Daily  . estradiol  2 mg Oral QHS  . pantoprazole  40 mg Oral Q1200  . potassium chloride  40 mEq Oral Q4H   Continuous Infusions:    . sodium chloride 100 mL/hr at 09/08/11 1950   PRN Meds:.acetaminophen, acetaminophen, diphenhydrAMINE, diphenhydrAMINE, HYDROmorphone (DILAUDID) injection, ondansetron (ZOFRAN)  IV  Assessment/Plan: 1. Acute on chronic pancreatitis: CT negative for acute process Lipase normalized, advance diet and follow. 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 2. H/o MUNCHAUSEN'S SYNDROME: per prior documentation 3. Tobacco use: counseled to quit 4. Hypokalemia -Resolved   LOS: 3 days   Kela Millin [facility] Pager: [phone] 09/09/2011, 2:08 PM

## 2011-09-10 MED ORDER — PANTOPRAZOLE SODIUM 40 MG PO TBEC: 40.0000 mg | DELAYED_RELEASE_TABLET | Freq: Every day | ORAL | Status: DC

## 2011-09-10 MED ORDER — DIPHENHYDRAMINE HCL 25 MG PO CAPS: 25.0000 mg | ORAL_CAPSULE | Freq: Four times a day (QID) | ORAL | Status: DC | PRN

## 2011-09-10 MED ORDER — HYDROMORPHONE HCL 2 MG PO TABS: 2.0000 mg | ORAL_TABLET | Freq: Four times a day (QID) | ORAL | Status: DC | PRN

## 2011-09-10 NOTE — Discharge Summary (Signed)
Discharge Note  Name: [name] MRN: [id number] DOB: 1980-08-24 31 y.o.  Date of Admission: 09/06/2011  6:36 AM Date of Discharge: 09/10/2011 Attending Physician: Kela Millin, MD  Discharge Diagnosis: Active Problems: Acute pancreatitis H/O MUNCHAUSEN'S SYNDROME  PANCREATITIS, CHRONIC  ABDOMINAL PAIN Abnormal LFTs Hypokalemia  Discharge Medications: Medication List  As of 09/10/2011  4:54 PM   TAKE these medications         diphenhydrAMINE 25 mg capsule   Commonly known as: BENADRYL   Take 1 capsule (25 mg total) by mouth every 6 (six) hours as needed for itching.      estradiol 2 MG tablet   Commonly known as: ESTRACE   Take 1 tablet (2 mg total) by mouth at bedtime.      HYDROmorphone 2 MG tablet   Commonly known as: DILAUDID   Take 1 tablet (2 mg total) by mouth every 6 (six) hours as needed for pain.      pantoprazole 40 MG tablet   Commonly known as: PROTONIX   Take 1 tablet (40 mg total) by mouth daily at 12 noon.            Disposition and follow-up:   [name] was discharged from [facility] in improved/stable condition.    Follow-up Appointments: Discharge Orders    Future Appointments: Provider: Department: Dept Phone: Center:   09/14/2011 10:10 AM Caleen Essex III, MD [facility] [phone] None     Future Orders Please Complete By Expires   Diet fat modified      Scheduling Instructions:   LOW FAT LOW CHOLESTEROL DIET   Comments:   LOW CHOLESTEROL LOW FAT   Increase activity slowly         Consultations:  Dr. Marina Goodell, [facility]. Procedures Performed:  Dg Abd 1 View  09/04/2011  *RADIOLOGY REPORT*  Clinical Data: Upper abdominal pain, nausea and vomiting.  ABDOMEN - 1 VIEW  Comparison: Abdominal radiograph performed 09/02/2011  Findings: The visualized bowel gas pattern is unremarkable. Scattered air and stool filled loops of colon are seen; no abnormal dilatation of small bowel loops is seen to suggest small  bowel obstruction.  No free intra-abdominal air is identified, though evaluation for free air is limited on a single supine view.  The visualized osseous structures are within normal limits; the sacroiliac joints are unremarkable in appearance.  Clips are noted within the right upper quadrant, reflecting prior cholecystectomy.  The liver appears enlarged; it measured 27 cm on the prior study.  IMPRESSION:  1.  Unremarkable bowel gas pattern; no free intra-abdominal air seen. 2.  Hepatomegaly again noted.  Original Report Authenticated By: Tonia Ghent, M.D.   Ct Abdomen Pelvis W Contrast  09/06/2011  *RADIOLOGY REPORT*  Clinical Data: Abnormal LFTs.  Pancreatitis.  CT ABDOMEN AND PELVIS WITH CONTRAST  Technique:  Multidetector CT imaging of the abdomen and pelvis was performed following the standard protocol during bolus administration of intravenous contrast.  Contrast: OMNIPAQUE IOHEXOL 300 MG/ML IJ SOLN  Comparison: 06/11/2011  Findings: No focal abnormalities seen in the liver or spleen.  The stomach, duodenum, pancreas, and adrenal glands are unremarkable. 14 mm well-defined low density lesion in the right kidney is likely a cyst.  Left kidney is unremarkable.  No abdominal aortic aneurysm.  There is no free fluid or lymphadenopathy in the abdomen.  Imaging through the pelvis shows no free intraperitoneal fluid.  No pelvic sidewall lymphadenopathy.  The bladder is normal.  Uterus is surgically absent.  No evidence for adnexal mass.  No substantial diverticular change in the colon.  No colonic diverticulitis. Cecal tip is low in the right pelvis.  Neither the terminal ileum nor the appendix are well seen.  There is no evidence for edema or inflammation around the cecum.  Bone windows reveal no worrisome lytic or sclerotic osseous lesions.  IMPRESSION: Stable exam.  No new or acute interval findings.  Original Report Authenticated By: ERIC A. MANSELL, M.D.   Dg Abd Acute W/chest  09/02/2011   *RADIOLOGY REPORT*  Clinical Data: Upper abdominal pain for 5 weeks.  ACUTE ABDOMEN SERIES (ABDOMEN 2 VIEW & CHEST 1 VIEW)  Comparison: Chest and abdominal radiographs performed 04/28/2011, and CT of the abdomen and pelvis performed 06/11/2011  Findings: The lungs are well-aerated and clear.  There is no evidence of focal opacification, pleural effusion or pneumothorax. The cardiomediastinal silhouette is within normal limits.  The visualized bowel gas pattern is unremarkable.  Scattered stool and air are seen within the colon; there is no evidence of small bowel dilatation to suggest obstruction.  No free intra-abdominal air is identified on the provided upright view.  A small amount of air is seen within the stomach.  No acute osseous abnormalities are seen; the sacroiliac joints are unremarkable in appearance.  Clips are noted within the right upper quadrant, reflecting prior cholecystectomy.  IMPRESSION:  1.  Unremarkable bowel gas pattern; no free intra-abdominal air seen. 2.  No acute cardiopulmonary process identified.  Original Report Authenticated By: Tonia Ghent, M.D.     Admission HPI [name] is a 31 year old female with history of chronic pancreatitis, chronic abdominal pain, history of Manchausen's syndrome per records, was accepted in transfer by one of my partners early this morning from an outside hospital with abdominal pain and abnormal LFTs.  Patient reports increasing abdominal pain since last night, describes the pain as sharp located, located in the epigastrium, reports nausea with this.  She was hospitalized in early February for similar problems and left AMA, has been in the emergency room very frequently with abdominal pain as well. 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  She denies fevers or chills, denies diarrhea. Reports itching and is requesting Benadryl.  Physical Exam:  General: Alert, awake, oriented x3, in no acute distress.  HEENT: No  bruits, no goiter.  Heart: Regular rate and rhythm, without murmurs, rubs, gallops.  Lungs: Clear to auscultation bilaterally.  Abdomen: Soft, decreased epigastric and LUQ tenderness present, nondistended, positive bowel sounds.  Extremities: No clubbing cyanosis or edema with positive pedal pulses.  Neuro: Grossly intact, nonfocal.     Hospital Course by problem list: Acute pancreatitis  H/O MUNCHAUSEN'S SYNDROME  PANCREATITIS, CHRONIC  ABDOMINAL PAIN Abnormal LFTs Hypokalemia  1. Acute on chronic pancreatitis:  Upon admission a CT scan of the abdomen and pelvis was done and was negative for acute process. Patient was kept n.p.o. and hydrated with IV fluids. GI was consulted and Dr. Marina Goodell with [facility] saw patient agreed with conservative management and recommended starting on a diet and advancing as tolerated. He also recommended for patient to followup with the biliary group at the [facility] (as was noted and that she had an ERCP in the past that was unremarkable) for assessment of sphincter of Oddi dysfunction.The patient's diet was advanced and she is tolerating solids. Followup lipase was done and has normalized at this time. [facility] and the patient has been set  up for an appointment with Dr. Truitt Merle at the biliary clinic/GI on 6/4 at 12:00noon. She also has an appointment with Dr. Carolynne Edouard on 09/14/2011  pancreas divisum evaluation/management 2-Abnormal LFTs  LFTs were elevated on admission she had followup LFTs done and they were trending down. GI was consulted as above. Pt as has an appointment as scheduled to followup with a gastroenterologist in [place]. 3. H/o MUNCHAUSEN'S SYNDROME: per prior documentation  4. Tobacco use:  counseled to quit this hospitalization. 5. Hypokalemia  Potassium was repleted during this hospital stay.   Discharge Vitals:  BP 126/86  Pulse 89  Temp(Src) 98.2 F (36.8 C) (Oral)  Resp 18  Ht 5\' 5"  (1.651 m)  Wt 83.008 kg (183 lb)  BMI  30.45 kg/m2  SpO2 95%  LMP 06/01/2011  Discharge Labs: No results found for this or any previous visit (from the past 24 hour(s)).  SignedKela Millin 09/10/2011, 4:54 PM

## 2011-09-10 NOTE — Progress Notes (Signed)
Discharge instructions reviewed with pt and prescriptions given.  Pt verbalized understanding and had no questions.  Pt discharged in stable condition with family.  Mattilyn Crites Lindsay   

## 2011-09-14 ENCOUNTER — Encounter (INDEPENDENT_AMBULATORY_CARE_PROVIDER_SITE_OTHER): Payer: Self-pay | Admitting: General Surgery

## 2011-09-21 ENCOUNTER — Emergency Department (HOSPITAL_COMMUNITY)
Admission: EM | Admit: 2011-09-21 | Discharge: 2011-09-22 | Disposition: A | Discharge status: Home / Self Care | Payer: Medicaid Other | Attending: Emergency Medicine | Admitting: Emergency Medicine

## 2011-09-21 ENCOUNTER — Encounter (HOSPITAL_COMMUNITY): Payer: Self-pay | Admitting: Emergency Medicine

## 2011-09-21 DIAGNOSIS — F17200 Nicotine dependence, unspecified, uncomplicated: Secondary | ICD-10-CM | POA: Insufficient documentation

## 2011-09-21 DIAGNOSIS — M549 Dorsalgia, unspecified: Secondary | ICD-10-CM | POA: Insufficient documentation

## 2011-09-21 DIAGNOSIS — R10816 Epigastric abdominal tenderness: Secondary | ICD-10-CM | POA: Insufficient documentation

## 2011-09-21 DIAGNOSIS — R112 Nausea with vomiting, unspecified: Secondary | ICD-10-CM | POA: Insufficient documentation

## 2011-09-21 DIAGNOSIS — R109 Unspecified abdominal pain: Secondary | ICD-10-CM | POA: Insufficient documentation

## 2011-09-21 DIAGNOSIS — R197 Diarrhea, unspecified: Secondary | ICD-10-CM | POA: Insufficient documentation

## 2011-09-21 DIAGNOSIS — R5381 Other malaise: Secondary | ICD-10-CM | POA: Insufficient documentation

## 2011-09-21 DIAGNOSIS — G8929 Other chronic pain: Secondary | ICD-10-CM | POA: Insufficient documentation

## 2011-09-21 DIAGNOSIS — R5383 Other fatigue: Secondary | ICD-10-CM | POA: Insufficient documentation

## 2011-09-21 DIAGNOSIS — Z79899 Other long term (current) drug therapy: Secondary | ICD-10-CM | POA: Insufficient documentation

## 2011-09-21 NOTE — ED Notes (Signed)
Pt c/o abd pain nausea with vomiting.  Onset 4 days ago.

## 2011-09-22 LAB — CBC
Hemoglobin: 12.1 g/dL (ref 12.0–15.0)
MCH: 27.7 pg (ref 26.0–34.0)
MCHC: 34.2 g/dL (ref 30.0–36.0)
Platelets: 225 10*3/uL (ref 150–400)
RBC: 4.37 MIL/uL (ref 3.87–5.11)

## 2011-09-22 LAB — DIFFERENTIAL
Basophils Relative: 1 % (ref 0–1)
Eosinophils Absolute: 0.2 10*3/uL (ref 0.0–0.7)
Lymphocytes Relative: 38 % (ref 12–46)
Monocytes Absolute: 0.7 10*3/uL (ref 0.1–1.0)
Neutrophils Relative %: 46 % (ref 43–77)

## 2011-09-22 LAB — COMPREHENSIVE METABOLIC PANEL
ALT: 17 U/L (ref 0–35)
AST: 21 U/L (ref 0–37)
Albumin: 3.7 g/dL (ref 3.5–5.2)
Alkaline Phosphatase: 80 U/L (ref 39–117)
Calcium: 9.2 mg/dL (ref 8.4–10.5)
Glucose, Bld: 87 mg/dL (ref 70–99)
Potassium: 3.3 mEq/L — ABNORMAL LOW (ref 3.5–5.1)
Sodium: 140 mEq/L (ref 135–145)
Total Protein: 7.7 g/dL (ref 6.0–8.3)

## 2011-09-22 MED ORDER — HYDROCODONE-ACETAMINOPHEN 5-500 MG PO TABS: 1.0000 | ORAL_TABLET | Freq: Four times a day (QID) | ORAL | Status: DC | PRN

## 2011-09-22 MED ORDER — HYDROMORPHONE HCL PF 1 MG/ML IJ SOLN
1.0000 mg | Freq: Once | INTRAMUSCULAR | Status: AC
  Administered 2011-09-22: 1 mg via INTRAVENOUS
  Filled 2011-09-22: qty 1

## 2011-09-22 MED ORDER — PROMETHAZINE HCL 25 MG PO TABS: 25.0000 mg | ORAL_TABLET | Freq: Four times a day (QID) | ORAL | Status: DC | PRN

## 2011-09-22 MED ORDER — ONDANSETRON HCL 4 MG/2ML IJ SOLN
8.0000 mg | Freq: Once | INTRAMUSCULAR | Status: AC
  Administered 2011-09-22: 8 mg via INTRAVENOUS
  Filled 2011-09-22: qty 4

## 2011-09-22 MED ORDER — PANTOPRAZOLE SODIUM 40 MG IV SOLR
40.0000 mg | Freq: Once | INTRAVENOUS | Status: AC
  Administered 2011-09-22: 40 mg via INTRAVENOUS
  Filled 2011-09-22: qty 40

## 2011-09-22 MED ORDER — DIPHENHYDRAMINE HCL 50 MG/ML IJ SOLN
25.0000 mg | Freq: Once | INTRAMUSCULAR | Status: AC
  Administered 2011-09-22: 25 mg via INTRAVENOUS
  Filled 2011-09-22: qty 1

## 2011-09-22 MED ORDER — POTASSIUM CHLORIDE CRYS ER 20 MEQ PO TBCR
20.0000 meq | EXTENDED_RELEASE_TABLET | Freq: Once | ORAL | Status: AC
  Administered 2011-09-22: 20 meq via ORAL
  Filled 2011-09-22: qty 1

## 2011-09-22 MED ORDER — SODIUM CHLORIDE 0.9 % IV BOLUS (SEPSIS)
1000.0000 mL | Freq: Once | INTRAVENOUS | Status: AC
  Administered 2011-09-22: 1000 mL via INTRAVENOUS

## 2011-09-22 NOTE — Discharge Instructions (Signed)

## 2011-09-22 NOTE — ED Provider Notes (Signed)
History     CSN: [id number]  Arrival date & time 09/21/11  2305   First MD Initiated Contact with Patient 09/22/11 0206      Chief Complaint  Patient presents with  . Abdominal Pain    (Consider location/radiation/quality/duration/timing/severity/associated sxs/prior treatment) HPI Comments: Difficulty tolerating po meds including food and drink  Patient is a 31 y.o. female presenting with abdominal pain. The history is provided by the patient. No language interpreter was used.  Abdominal Pain The primary symptoms of the illness include abdominal pain, fatigue, nausea, vomiting and diarrhea. The primary symptoms of the illness do not include fever, shortness of breath, hematemesis, hematochezia or dysuria. The current episode started more than 2 days ago (4 days ago). The onset of the illness was gradual. The problem has been gradually worsening.  The pain came on gradually. The abdominal pain has been gradually worsening since its onset. The abdominal pain is located in the epigastric region (consistent with past pancreatitis pain). The abdominal pain radiates to the back. The abdominal pain is relieved by nothing. The abdominal pain is exacerbated by vomiting.  Nausea began 2 days ago. The nausea is associated with eating. The nausea is exacerbated by food.  The vomiting began 2 days ago. Vomiting occurs 2 to 5 times per day. The emesis contains stomach contents.  Symptoms associated with the illness do not include chills, constipation, urgency, frequency or back pain.    Past Medical History  Diagnosis Date  . Pancreatitis   . S/P laparoscopic cholecystectomy   . Fibroids   . Ovarian cyst   . Chronic abdominal pain   . Anemia     Past Surgical History  Procedure Date  . Cholecystectomy   . Abdominal hysterectomy   . Dilation and curettage of uterus     Family History  Problem Relation Age of Onset  . Diabetes Mother   . Hypertension Mother   . Hypertension Father     . Diabetes Father   . Asthma Daughter     History  Substance Use Topics  . Smoking status: Current Everyday Smoker -- 0.5 packs/day for 10 years    Types: Cigarettes  . Smokeless tobacco: Never Used   Comment: declined  . Alcohol Use: No    OB History    Grav Para Term Preterm Abortions TAB SAB Ect Mult Living   4 2 1 1 2  2          Review of Systems  Constitutional: Positive for fatigue. Negative for fever, chills, activity change and appetite change.  HENT: Negative for congestion, sore throat, rhinorrhea, neck pain and neck stiffness.   Respiratory: Negative for cough and shortness of breath.   Cardiovascular: Negative for chest pain and palpitations.  Gastrointestinal: Positive for nausea, vomiting, abdominal pain and diarrhea. Negative for constipation, blood in stool, hematochezia and hematemesis.  Genitourinary: Negative for dysuria, urgency, frequency and flank pain.  Musculoskeletal: Negative for myalgias, back pain and arthralgias.  Neurological: Negative for dizziness, weakness, light-headedness, numbness and headaches.  All other systems reviewed and are negative.    Allergies  Celecoxib; Ketorolac tromethamine; Meperidine hcl; Fentanyl; Ibuprofen; Morphine; Propoxyphene n-acetaminophen; and Tramadol hcl  Home Medications   Current Outpatient Rx  Name Route Sig Dispense Refill  . DIPHENHYDRAMINE HCL 25 MG PO CAPS Oral Take 1 capsule (25 mg total) by mouth every 6 (six) hours as needed for itching.    Marland Kitchen ESTRADIOL 2 MG PO TABS Oral Take 1 tablet (2 mg total)  by mouth at bedtime. 30 tablet 3  . PANTOPRAZOLE SODIUM 40 MG PO TBEC Oral Take 1 tablet (40 mg total) by mouth daily at 12 noon. 30 tablet 0  . HYDROCODONE-ACETAMINOPHEN 5-500 MG PO TABS Oral Take 1-2 tablets by mouth every 6 (six) hours as needed for pain. 8 tablet 0  . PROMETHAZINE HCL 25 MG PO TABS Oral Take 1 tablet (25 mg total) by mouth every 6 (six) hours as needed for nausea. 10 tablet 0    BP  123/82  Pulse 94  Temp(Src) 98.2 F (36.8 C) (Oral)  Resp 16  SpO2 96%  LMP 06/01/2011  Physical Exam  Nursing note and vitals reviewed. Constitutional: She is oriented to person, place, and time. She appears well-developed and well-nourished. No distress.  HENT:  Head: Normocephalic and atraumatic.  Mouth/Throat: Oropharynx is clear and moist. No oropharyngeal exudate.  Eyes: Conjunctivae and EOM are normal. Pupils are equal, round, and reactive to light.  Neck: Normal range of motion. Neck supple.  Cardiovascular: Normal rate, regular rhythm, normal heart sounds and intact distal pulses.  Exam reveals no gallop and no friction rub.   No murmur heard. Pulmonary/Chest: Effort normal and breath sounds normal. No respiratory distress. She exhibits no tenderness.  Abdominal: Soft. Bowel sounds are normal. There is tenderness (epigastric). There is no rebound and no guarding.  Musculoskeletal: Normal range of motion. She exhibits no tenderness.  Neurological: She is alert and oriented to person, place, and time. No cranial nerve deficit.  Skin: Skin is warm and dry. No rash noted.    ED Course  Procedures (including critical care time)  Labs Reviewed  CBC - Abnormal; Notable for the following:    HCT 35.4 (*)    All other components within normal limits  COMPREHENSIVE METABOLIC PANEL - Abnormal; Notable for the following:    Potassium 3.3 (*)    All other components within normal limits  DIFFERENTIAL  LIPASE, BLOOD  URINALYSIS, ROUTINE W REFLEX MICROSCOPIC   No results found.   1. Chronic abdominal pain   2. Nausea and vomiting       MDM  Chronic abdominal pain with relatively normal labs. I doubt anything acute. There is no indication for imaging at this time. She received IV fluids, antiemetics, pain medication. She had improvement of her symptoms. She is able to tolerate oral intake in emergency department. She'll be discharged home with instructions to followup with  her primary care physician.        Dayton Bailiff, MD 09/22/11 [phone]

## 2011-09-23 ENCOUNTER — Encounter (HOSPITAL_COMMUNITY): Payer: Self-pay | Admitting: Emergency Medicine

## 2011-09-23 ENCOUNTER — Emergency Department (HOSPITAL_COMMUNITY)
Admission: EM | Admit: 2011-09-23 | Discharge: 2011-09-24 | Disposition: A | Discharge status: Home / Self Care | Payer: Medicaid Other | Attending: Emergency Medicine | Admitting: Emergency Medicine

## 2011-09-23 DIAGNOSIS — R112 Nausea with vomiting, unspecified: Secondary | ICD-10-CM

## 2011-09-23 DIAGNOSIS — R1013 Epigastric pain: Secondary | ICD-10-CM | POA: Insufficient documentation

## 2011-09-23 NOTE — ED Notes (Signed)
PT. REPORTS CHRONIC GENERALIZED ABDOMINAL PAIN WITH VOMITTING AND DIARRHEA FOR SEVERAL YEARS.

## 2011-09-24 ENCOUNTER — Encounter (HOSPITAL_COMMUNITY): Payer: Self-pay | Admitting: *Deleted

## 2011-09-24 LAB — COMPREHENSIVE METABOLIC PANEL
ALT: 53 U/L — ABNORMAL HIGH (ref 0–35)
BUN: 12 mg/dL (ref 6–23)
CO2: 21 mEq/L (ref 19–32)
Calcium: 9.1 mg/dL (ref 8.4–10.5)
GFR calc Af Amer: >90 mL/min (ref >90)
GFR calc non Af Amer: >90 mL/min (ref >90)
Glucose, Bld: 94 mg/dL (ref 70–99)
Sodium: 138 mEq/L (ref 135–145)

## 2011-09-24 LAB — CBC
HCT: 34.5 % — ABNORMAL LOW (ref 36.0–46.0)
Hemoglobin: 12 g/dL (ref 12.0–15.0)
MCH: 27.6 pg (ref 26.0–34.0)
MCHC: 34.8 g/dL (ref 30.0–36.0)
MCV: 79.5 fL (ref 78.0–100.0)
RBC: 4.34 MIL/uL (ref 3.87–5.11)

## 2011-09-24 LAB — DIFFERENTIAL
Basophils Relative: 1 % (ref 0–1)
Eosinophils Absolute: 0.2 10*3/uL (ref 0.0–0.7)
Eosinophils Relative: 4 % (ref 0–5)
Lymphs Abs: 2.4 10*3/uL (ref 0.7–4.0)
Monocytes Absolute: 0.6 10*3/uL (ref 0.1–1.0)
Neutro Abs: 2.9 10*3/uL (ref 1.7–7.7)
Neutrophils Relative %: 47 % (ref 43–77)

## 2011-09-24 LAB — LIPASE, BLOOD: Lipase: 74 U/L — ABNORMAL HIGH (ref 11–59)

## 2011-09-24 MED ORDER — DIPHENHYDRAMINE HCL 50 MG/ML IJ SOLN
25.0000 mg | Freq: Once | INTRAMUSCULAR | Status: AC
  Administered 2011-09-24: 25 mg via INTRAVENOUS
  Filled 2011-09-24: qty 1

## 2011-09-24 MED ORDER — HYDROMORPHONE HCL PF 1 MG/ML IJ SOLN
1.0000 mg | Freq: Once | INTRAMUSCULAR | Status: AC
  Administered 2011-09-24: 1 mg via INTRAVENOUS
  Filled 2011-09-24: qty 1

## 2011-09-24 MED ORDER — SODIUM CHLORIDE 0.9 % IV BOLUS (SEPSIS)
1000.0000 mL | Freq: Once | INTRAVENOUS | Status: AC
  Administered 2011-09-24 (×2): 1000 mL via INTRAVENOUS

## 2011-09-24 MED ORDER — HYDROMORPHONE HCL PF 2 MG/ML IJ SOLN
2.0000 mg | Freq: Once | INTRAMUSCULAR | Status: AC
  Administered 2011-09-24: 2 mg via INTRAVENOUS
  Filled 2011-09-24: qty 1

## 2011-09-24 MED ORDER — ONDANSETRON HCL 4 MG/2ML IJ SOLN
4.0000 mg | Freq: Once | INTRAMUSCULAR | Status: AC
  Administered 2011-09-24: 4 mg via INTRAVENOUS
  Filled 2011-09-24: qty 2

## 2011-09-24 NOTE — ED Notes (Signed)
Pt resting. Reports no relief after medications. PA made aware.

## 2011-09-24 NOTE — ED Notes (Signed)
Pt c/o epigastric/ rlq pain since 3/11. Also c/o N/V/D since the same time. Treated in ED for same. Reports no relief from course of treatment. Pt resting, in NAD. Call light within reach. Denies further needs at this time

## 2011-09-24 NOTE — ED Provider Notes (Signed)
History     CSN: [id number]  Arrival date & time 09/23/11  2312   First MD Initiated Contact with Patient 09/24/11 0145      Chief Complaint  Patient presents with  . Abdominal Pain    (Consider location/radiation/quality/duration/timing/severity/associated sxs/prior treatment) HPI Comments: Pt with a history of pancreatitis & ovarian cysts ED w cc of  abdominal pain. Pt has had chronic pain for several years and states she is out of pain medication. The current episode began gradually this morning and has progressed to a 10/10 in severity. Pain is located in the epigastric region and does not radiate. Pt denies fevers, night seats, and chills. Associated s/s include nausea, vomiting and diarrhea.   The history is provided by the patient.    Past Medical History  Diagnosis Date  . Pancreatitis   . S/P laparoscopic cholecystectomy   . Fibroids   . Ovarian cyst   . Chronic abdominal pain   . Anemia     Past Surgical History  Procedure Date  . Cholecystectomy   . Abdominal hysterectomy   . Dilation and curettage of uterus     Family History  Problem Relation Age of Onset  . Diabetes Mother   . Hypertension Mother   . Hypertension Father   . Diabetes Father   . Asthma Daughter     History  Substance Use Topics  . Smoking status: Current Everyday Smoker -- 0.5 packs/day for 10 years    Types: Cigarettes  . Smokeless tobacco: Never Used   Comment: declined  . Alcohol Use: No    OB History    Grav Para Term Preterm Abortions TAB SAB Ect Mult Living   4 2 1 1 2  2          Review of Systems  Constitutional: Negative for fever, chills and appetite change.  HENT: Negative for congestion.   Eyes: Negative for visual disturbance.  Respiratory: Negative for shortness of breath.   Cardiovascular: Negative for chest pain and leg swelling.  Gastrointestinal: Positive for nausea, vomiting, abdominal pain and diarrhea. Negative for constipation, blood in stool,  abdominal distention, anal bleeding and rectal pain.  Genitourinary: Negative for dysuria, urgency and frequency.  Skin: Negative for rash.  Neurological: Negative for dizziness, syncope, weakness, light-headedness, numbness and headaches.  Psychiatric/Behavioral: Negative for confusion.  All other systems reviewed and are negative.    Allergies  Celecoxib; Ketorolac tromethamine; Meperidine hcl; Fentanyl; Ibuprofen; Morphine; Propoxyphene n-acetaminophen; and Tramadol hcl  Home Medications   Current Outpatient Rx  Name Route Sig Dispense Refill  . PROMETHAZINE HCL 25 MG PO TABS Oral Take 1 tablet (25 mg total) by mouth every 6 (six) hours as needed for nausea. 10 tablet 0    BP 126/86  Pulse 92  Temp(Src) 99.2 F (37.3 C) (Oral)  Resp 20  SpO2 100%  LMP 06/01/2011  Physical Exam  Nursing note and vitals reviewed. Constitutional: Vital signs are normal. She appears well-developed and well-nourished. No distress.  HENT:  Head: Normocephalic and atraumatic.  Mouth/Throat: Uvula is midline, oropharynx is clear and moist and mucous membranes are normal.  Eyes: Conjunctivae and EOM are normal. Pupils are equal, round, and reactive to light.  Neck: Normal range of motion and full passive range of motion without pain. Neck supple. No spinous process tenderness and no muscular tenderness present. No rigidity. No Brudzinski's sign noted.  Cardiovascular: Normal rate and regular rhythm.   Pulmonary/Chest: Effort normal and breath sounds normal. No accessory muscle  usage. Not tachypneic. No respiratory distress.  Abdominal: Soft. Normal appearance, normal aorta and bowel sounds are normal. She exhibits no distension, no ascites, no pulsatile midline mass and no mass. There is tenderness. There is no CVA tenderness. No hernia.         Diffuse w localization in the epigastric area.   Lymphadenopathy:    She has no cervical adenopathy.  Neurological: She is alert.  Skin: Skin is warm  and dry. No rash noted. She is not diaphoretic.  Psychiatric: She has a normal mood and affect. Her speech is normal and behavior is normal.    ED Course  Procedures (including critical care time)  Labs Reviewed  CBC - Abnormal; Notable for the following:    HCT 34.5 (*)    All other components within normal limits  COMPREHENSIVE METABOLIC PANEL - Abnormal; Notable for the following:    AST 41 (*)    ALT 53 (*)    Alkaline Phosphatase 143 (*)    All other components within normal limits  LIPASE, BLOOD - Abnormal; Notable for the following:    Lipase 74 (*)    All other components within normal limits  DIFFERENTIAL   No results found.   No diagnosis found.    MDM  Chronic abdominal pain, acute exacerbation  Pt with elevated LFTs and lipase, similar to past hospital visits. Pt states her pain has been managed in the ED and she has been able to tolerate PO fluids without vomiting. No emesis episodes during hospital visit. Pt has been advised to have a clear liquid diet for likely dx of acute on chronic pancreatitis. She verbalizes understanding. Pt to f-u with GI doctor and advised to return to ED if s/s worsen.          Jaci Carrel, [place] 09/24/11 [phone]

## 2011-09-24 NOTE — Discharge Instructions (Signed)
Follow up with the Gastroenterologist or general surgeon listed above for further evaluation of your abdominal pain. Start a clear liquid diet for possible dx of pancreatitis flare until your symptoms begin to improve and read instructions below.  Acute Pancreatitis  The pancreas is a large gland located behind your stomach. It produces (secretes) enzymes. These enzymes help digest food. It also releases the hormones glucagon and insulin. These hormones help regulate blood sugar. When the pancreas becomes inflamed, the disease is called pancreatitis. Inflammation of the pancreas occurs when enzymes from the pancreas begin attacking and digesting the pancreas.  CAUSES  Most cases ofsudden onset (acute) pancreatitis are caused by:  Alcohol abuse.  Gallstones.  Other less common causes are:  Some medications.  Exposure to certain chemicals  Infection.  Damage caused by an accident (trauma).  Surgery of the belly (abdomen).  SYMPTOMS  Acute pancreatitis usually begins with pain in the upper abdomen and may radiate to the back. This pain may last a couple days. The constant pain varies from mild to severe. The acute form of this disease may vary from mild, nonspecific abdominal pain to profound shock with coma. About 1 in 5 cases are severe. These patients become dehydrated and develop low blood pressure. In severe cases, bleeding into the pancreas can lead to shock and death. The lungs, heart, and kidneys may fail.  TREATMENT  Most pancreatitis requires treatment of symptoms. Most acute attacks last a couple of days. Your caregiver can discuss the treatment options with you.  If complications occur, hospitalization may be necessary for pain control and intravenous (IV) fluid replacement.  Sometimes, a tube may be put into the stomach to control vomiting.  Food may not be allowed for 3 to 4 days. This gives the pancreas time to rest. Giving the pancreas a rest means there is no stimulation that would  produce more enzymes and cause more damage.  Medicines (antibiotics) that kill germs may be given if infection is the cause.  Sometimes, surgery may be required.  Following an acute attack, your caregiver will determine the cause, if possible, and offer suggestions to prevent recurrences.  HOME CARE INSTRUCTIONS  Eat smaller, more frequent meals. This reduces the amount of digestive juices the pancreas produces.  Decrease the amount of fat in your diet. This may help reduce loose, diarrheal stools.  Drink enough water and fluids to keep your urine clear or pale yellow. This is to avoid dehydration which can cause increased pain.  Talk to your caregiver about pain relievers or other medicines that may help.  Avoid anything that may have triggered your pancreatitis (for example, alcohol).  Follow the diet advised by your caregiver. Do not advance the diet too soon.  Take medicines as prescribed.  Get plenty of rest.  Check your blood sugar at home as directed by your caregiver.  If your caregiver has given you a follow-up appointment, it is very important to keep that appointment. Not keeping the appointment could result in a lasting (chronic) or permanent injury, pain, and disability. If there is any problem keeping the appointment, you must call to reschedule.  SEEK MEDICAL CARE IF:  You are not recovering in the time described by your caregiver.  You have persistent pain, weakness, or feel sick to your stomach (nauseous).  You have recovered and then have another bout of pain.  SEEK IMMEDIATE MEDICAL CARE IF:  You are unable to eat or keep fluids down.  Your pain increases a lot  or changes.  You have an oral temperature above 102 F (38.9 C), not controlled by medicine.  Your skin or the white part of your eyes look yellow (jaundice).  You develop vomiting.  You feel dizzy or faint.  Your blood sugar is high (over 300).    Abdominal Pain  Your exam might not show the exact reason you  have abdominal pain. Since there are many different causes of abdominal pain, another checkup and more tests may be needed. It is very important to follow up for lasting (persistent) or worsening symptoms. A possible cause of abdominal pain in any person who still has his or her appendix is acute appendicitis. Appendicitis is often hard to diagnose. Normal blood tests, urine tests, ultrasound, and CT scans do not completely rule out early appendicitis or other causes of abdominal pain. Sometimes, only the changes that happen over time will allow appendicitis and other causes of abdominal pain to be determined. Other potential problems that may require surgery may also take time to become more apparent. Because of this, it is important that you follow all of the instructions below.   HOME CARE INSTRUCTIONS  Do not take laxatives unless directed by your caregiver. Rest as much as possible.  Do not eat solid food until your pain is gone: A diet of water, weak decaffeinated tea, broth or bouillon, gelatin, oral rehydration solutions (ORS), frozen ice pops, or ice chips may be helpful.  When pain is gone: Start a light diet (dry toast, crackers, applesauce, or white rice). Increase the diet slowly as long as it does not bother you. Eat no dairy products (including cheese and eggs) and no spicy, fatty, fried, or high-fiber foods.  Use no alcohol, caffeine, or cigarettes.  Take your regular medicines unless your caregiver told you not to.  Take any prescribed medicine as directed.   SEEK IMMEDIATE MEDICAL CARE IF:  The pain does not go away.  You have a fever >101 that persists You keep throwing up (vomiting) or cannot drink liquids.  The pain becomes localized (Pain in the right side could possibly be appendicitis. In an adult, pain in the left lower portion of the abdomen could be colitis or diverticulitis). You pass bloody or black tarry stools.  You have shaking chills.  There is blood in your vomit  or you see blood in your bowel movements.  Your bowel movements stop (become blocked) or you cannot pass gas.  You have bloody, frequent, or painful urination.  You have yellow discoloration in the skin or whites of the eyes.  Your stomach becomes bloated or bigger.  You have dizziness or fainting.  You have chest or back pain.

## 2011-09-24 NOTE — ED Notes (Signed)
Pt requested more pain medication. PA aware and pt advised she would not be prescribing any more pain medication. Pt states "so what am i supposed to do? Go home and take my dilaudid and vicodin pills?!" attempted therapeutic communication and education regarding use of pain medications. Pt requires disinterested in information at this time. Pt requested food prior to discharge. Crackers and ginger ale provided per her request. Denies further needs at this time

## 2011-09-24 NOTE — ED Notes (Signed)
Pt resting. Pt appropriate, but slow to respond to questions. Pt states "if they're gonna send me home i'm going to need more for my pain. That only helped a little"

## 2011-09-24 NOTE — ED Notes (Signed)
Pt medicated per order. Pt upset that medication was not put in closest port to pt. Pt states "i hate it when you put it up there! It doesn't work right. The nurse that did it last time put it in the one closest to me and it worked a lot better. Pt speaking on the phone and looking at pictures on facebook

## 2011-09-26 ENCOUNTER — Encounter (HOSPITAL_COMMUNITY): Payer: Self-pay | Admitting: *Deleted

## 2011-09-26 ENCOUNTER — Emergency Department (HOSPITAL_COMMUNITY)
Admission: EM | Admit: 2011-09-26 | Discharge: 2011-09-27 | Disposition: A | Discharge status: Home / Self Care | Payer: Medicaid Other | Attending: Emergency Medicine | Admitting: Emergency Medicine

## 2011-09-26 DIAGNOSIS — G8929 Other chronic pain: Secondary | ICD-10-CM | POA: Insufficient documentation

## 2011-09-26 DIAGNOSIS — R197 Diarrhea, unspecified: Secondary | ICD-10-CM | POA: Insufficient documentation

## 2011-09-26 DIAGNOSIS — R1013 Epigastric pain: Secondary | ICD-10-CM | POA: Insufficient documentation

## 2011-09-26 DIAGNOSIS — R112 Nausea with vomiting, unspecified: Secondary | ICD-10-CM | POA: Insufficient documentation

## 2011-09-26 DIAGNOSIS — K861 Other chronic pancreatitis: Secondary | ICD-10-CM | POA: Insufficient documentation

## 2011-09-26 NOTE — ED Notes (Signed)
Pt states that her pancreatitis is flaring up.  Pt states that she has had abdominal pain, n/v and diarrhea.  No distress in triage

## 2011-09-27 LAB — COMPREHENSIVE METABOLIC PANEL
ALT: 50 U/L — ABNORMAL HIGH (ref 0–35)
AST: 36 U/L (ref 0–37)
Albumin: 3.9 g/dL (ref 3.5–5.2)
Alkaline Phosphatase: 174 U/L — ABNORMAL HIGH (ref 39–117)
Potassium: 4.5 mEq/L (ref 3.5–5.1)
Sodium: 136 mEq/L (ref 135–145)
Total Protein: 8.5 g/dL — ABNORMAL HIGH (ref 6.0–8.3)

## 2011-09-27 LAB — DIFFERENTIAL
Basophils Relative: 2 % — ABNORMAL HIGH (ref 0–1)
Eosinophils Absolute: 0.3 10*3/uL (ref 0.0–0.7)
Lymphs Abs: 3.2 10*3/uL (ref 0.7–4.0)
Neutro Abs: 2 10*3/uL (ref 1.7–7.7)
Neutrophils Relative %: 31 % — ABNORMAL LOW (ref 43–77)

## 2011-09-27 LAB — CBC
MCH: 27.6 pg (ref 26.0–34.0)
MCHC: 34.4 g/dL (ref 30.0–36.0)
Platelets: 247 10*3/uL (ref 150–400)
RBC: 4.96 MIL/uL (ref 3.87–5.11)

## 2011-09-27 MED ORDER — DIPHENHYDRAMINE HCL 25 MG PO CAPS
25.0000 mg | ORAL_CAPSULE | Freq: Once | ORAL | Status: AC
  Administered 2011-09-27: 25 mg via ORAL
  Filled 2011-09-27: qty 1

## 2011-09-27 MED ORDER — HYDROMORPHONE HCL PF 1 MG/ML IJ SOLN
0.5000 mg | Freq: Once | INTRAMUSCULAR | Status: AC
  Administered 2011-09-27: 0.5 mg via INTRAMUSCULAR
  Filled 2011-09-27: qty 1

## 2011-09-27 MED ORDER — HYDROMORPHONE HCL PF 1 MG/ML IJ SOLN: 0.5000 mg | Freq: Once | INTRAMUSCULAR | Status: DC

## 2011-09-27 MED ORDER — PROMETHAZINE HCL 25 MG/ML IJ SOLN
25.0000 mg | Freq: Once | INTRAMUSCULAR | Status: DC
  Filled 2011-09-27: qty 1

## 2011-09-27 MED ORDER — PROMETHAZINE HCL 25 MG/ML IJ SOLN
12.5000 mg | Freq: Once | INTRAMUSCULAR | Status: AC
  Administered 2011-09-27: 12.5 mg via INTRAMUSCULAR
  Filled 2011-09-27: qty 1

## 2011-09-27 NOTE — ED Notes (Addendum)
Pt refused blood draw from lab.  IV team was called at pt request to get blood draw and start IV.

## 2011-09-27 NOTE — ED Notes (Signed)
Pt has had pancreatitis for 8 years.  States that she is having a flare up with N/V pain 10/10.

## 2011-09-27 NOTE — ED Provider Notes (Signed)
History     CSN: [id number]  Arrival date & time 09/26/11  2244   First MD Initiated Contact with Patient 09/27/11 0016      Chief Complaint  Patient presents with  . Abdominal Pain    (Consider location/radiation/quality/duration/timing/severity/associated sxs/prior treatment) Patient is a 31 y.o. female presenting with abdominal pain. The history is provided by the patient. No language interpreter was used.  Abdominal Pain The primary symptoms of the illness include abdominal pain, nausea, vomiting and diarrhea. The current episode started more than 2 days ago. The onset of the illness was gradual. The problem has not changed since onset. The abdominal pain is located in the epigastric region. The abdominal pain does not radiate. The severity of the abdominal pain is 10/10. The abdominal pain is relieved by nothing. Exacerbated by: nothing.  Associated with: chronic pancreatitis. The patient states that she believes she is currently not pregnant. Risk factors for an acute abdominal problem include a history of abdominal surgery. Symptoms associated with the illness do not include diaphoresis or constipation. Significant associated medical issues do not include diabetes.  Patient states she is here for her typical pancreatitis pain.  States she is scheduled to see Dr. Carolynne Edouard for a procedure but has not yet been able to get in.  No f/c/r  Past Medical History  Diagnosis Date  . Pancreatitis   . S/P laparoscopic cholecystectomy   . Fibroids   . Ovarian cyst   . Chronic abdominal pain   . Anemia     Past Surgical History  Procedure Date  . Cholecystectomy   . Abdominal hysterectomy   . Dilation and curettage of uterus     Family History  Problem Relation Age of Onset  . Diabetes Mother   . Hypertension Mother   . Hypertension Father   . Diabetes Father   . Asthma Daughter     History  Substance Use Topics  . Smoking status: Current Everyday Smoker -- 0.5 packs/day for 10  years    Types: Cigarettes  . Smokeless tobacco: Never Used   Comment: declined  . Alcohol Use: No    OB History    Grav Para Term Preterm Abortions TAB SAB Ect Mult Living   4 2 1 1 2  2          Review of Systems  Constitutional: Negative for diaphoresis.  HENT: Negative.   Eyes: Negative.   Respiratory: Negative.   Cardiovascular: Negative.   Gastrointestinal: Positive for nausea, vomiting, abdominal pain and diarrhea. Negative for constipation.  Genitourinary: Negative.   Musculoskeletal: Negative.   Skin: Negative.   Neurological: Negative.   Hematological: Negative.   Psychiatric/Behavioral: Negative.     Allergies  Celecoxib; Ketorolac tromethamine; Meperidine hcl; Fentanyl; Ibuprofen; Morphine; Propoxyphene n-acetaminophen; and Tramadol hcl  Home Medications   Current Outpatient Rx  Name Route Sig Dispense Refill  . HYDROCODONE-ACETAMINOPHEN 5-500 MG PO TABS Oral Take 1 tablet by mouth every 6 (six) hours as needed. For pain    . HYDROMORPHONE HCL 2 MG PO TABS Oral Take 2 mg by mouth every 4 (four) hours as needed. For pain    . ONDANSETRON 8 MG PO TBDP Oral Take 8 mg by mouth every 8 (eight) hours as needed. For nausea    . PROMETHAZINE HCL 25 MG RE SUPP Rectal Place 25 mg rectally every 6 (six) hours as needed. For nausea    . PROMETHAZINE HCL 25 MG PO TABS Oral Take 25 mg by mouth every  6 (six) hours as needed. For nausea      BP 120/79  Pulse 102  Temp(Src) 98.3 F (36.8 C) (Oral)  Resp 20  SpO2 97%  LMP 06/01/2011  Physical Exam  Constitutional: She is oriented to person, place, and time. She appears well-developed and well-nourished. No distress.  HENT:  Head: Normocephalic and atraumatic.  Mouth/Throat: Oropharynx is clear and moist. No oropharyngeal exudate.       Moist mucus membranes  Eyes: Conjunctivae are normal. Pupils are equal, round, and reactive to light.  Neck: Normal range of motion. Neck supple.  Cardiovascular: Normal rate and  regular rhythm.   Pulmonary/Chest: Effort normal and breath sounds normal. No respiratory distress. She has no rales.  Abdominal: Soft. Bowel sounds are normal. There is no tenderness. There is no rebound and no guarding.  Musculoskeletal: Normal range of motion.  Neurological: She is alert and oriented to person, place, and time.  Skin: Skin is warm and dry. She is not diaphoretic.  Psychiatric: She has a normal mood and affect.    ED Course  Procedures (including critical care time)   Labs Reviewed  CBC  DIFFERENTIAL  COMPREHENSIVE METABOLIC PANEL  LIPASE, BLOOD   No results found.   No diagnosis found.    MDM  Pain is chronic in nature.  Benign abdominal exam.  No indication for imaging at this time.  Patient without vomiting in the ED playing games on the cell phone.  Will give a second IM dose of medication and discharge.         Jasmine Awe, MD 09/27/11 [phone]

## 2011-09-28 ENCOUNTER — Encounter (HOSPITAL_COMMUNITY): Payer: Self-pay | Admitting: Emergency Medicine

## 2011-09-28 ENCOUNTER — Emergency Department (HOSPITAL_COMMUNITY)
Admission: EM | Admit: 2011-09-28 | Discharge: 2011-09-29 | Disposition: A | Discharge status: Home / Self Care | Payer: Medicaid Other | Attending: Emergency Medicine | Admitting: Emergency Medicine

## 2011-09-28 ENCOUNTER — Emergency Department (HOSPITAL_COMMUNITY)
Admission: EM | Admit: 2011-09-28 | Discharge: 2011-09-28 | Disposition: A | Discharge status: Home / Self Care | Payer: Medicaid Other | Source: Home / Self Care | Attending: Emergency Medicine | Admitting: Emergency Medicine

## 2011-09-28 ENCOUNTER — Emergency Department (HOSPITAL_COMMUNITY): Payer: Medicaid Other

## 2011-09-28 ENCOUNTER — Encounter (HOSPITAL_COMMUNITY): Payer: Self-pay | Admitting: *Deleted

## 2011-09-28 DIAGNOSIS — R197 Diarrhea, unspecified: Secondary | ICD-10-CM | POA: Insufficient documentation

## 2011-09-28 DIAGNOSIS — R112 Nausea with vomiting, unspecified: Secondary | ICD-10-CM | POA: Insufficient documentation

## 2011-09-28 DIAGNOSIS — F17200 Nicotine dependence, unspecified, uncomplicated: Secondary | ICD-10-CM | POA: Insufficient documentation

## 2011-09-28 DIAGNOSIS — K861 Other chronic pancreatitis: Secondary | ICD-10-CM | POA: Insufficient documentation

## 2011-09-28 DIAGNOSIS — R10812 Left upper quadrant abdominal tenderness: Secondary | ICD-10-CM | POA: Insufficient documentation

## 2011-09-28 DIAGNOSIS — R110 Nausea: Secondary | ICD-10-CM

## 2011-09-28 DIAGNOSIS — G8929 Other chronic pain: Secondary | ICD-10-CM

## 2011-09-28 DIAGNOSIS — R109 Unspecified abdominal pain: Secondary | ICD-10-CM | POA: Insufficient documentation

## 2011-09-28 DIAGNOSIS — R10817 Generalized abdominal tenderness: Secondary | ICD-10-CM | POA: Insufficient documentation

## 2011-09-28 DIAGNOSIS — Z9889 Other specified postprocedural states: Secondary | ICD-10-CM | POA: Insufficient documentation

## 2011-09-28 DIAGNOSIS — R10811 Right upper quadrant abdominal tenderness: Secondary | ICD-10-CM | POA: Insufficient documentation

## 2011-09-28 DIAGNOSIS — M549 Dorsalgia, unspecified: Secondary | ICD-10-CM | POA: Insufficient documentation

## 2011-09-28 DIAGNOSIS — R10816 Epigastric abdominal tenderness: Secondary | ICD-10-CM | POA: Insufficient documentation

## 2011-09-28 LAB — COMPREHENSIVE METABOLIC PANEL
ALT: 42 U/L — ABNORMAL HIGH (ref 0–35)
CO2: 24 mEq/L (ref 19–32)
Calcium: 8.8 mg/dL (ref 8.4–10.5)
GFR calc Af Amer: >90 mL/min (ref >90)
GFR calc non Af Amer: >90 mL/min (ref >90)
Glucose, Bld: 94 mg/dL (ref 70–99)
Sodium: 138 mEq/L (ref 135–145)
Total Bilirubin: 0.3 mg/dL (ref 0.3–1.2)

## 2011-09-28 LAB — CBC
HCT: 36.8 % (ref 36.0–46.0)
MCV: 80 fL (ref 78.0–100.0)
Platelets: 207 10*3/uL (ref 150–400)
RBC: 4.6 MIL/uL (ref 3.87–5.11)
WBC: 6.2 10*3/uL (ref 4.0–10.5)

## 2011-09-28 LAB — DIFFERENTIAL
Eosinophils Relative: 5 % (ref 0–5)
Lymphocytes Relative: 52 % — ABNORMAL HIGH (ref 12–46)
Lymphs Abs: 3.2 10*3/uL (ref 0.7–4.0)
Monocytes Absolute: 0.7 10*3/uL (ref 0.1–1.0)

## 2011-09-28 MED ORDER — ONDANSETRON HCL 4 MG/2ML IJ SOLN
4.0000 mg | Freq: Once | INTRAMUSCULAR | Status: AC
  Administered 2011-09-28: 4 mg via INTRAVENOUS
  Filled 2011-09-28: qty 2

## 2011-09-28 MED ORDER — DIPHENHYDRAMINE HCL 50 MG/ML IJ SOLN
25.0000 mg | Freq: Once | INTRAMUSCULAR | Status: AC
  Administered 2011-09-28: 25 mg via INTRAVENOUS
  Filled 2011-09-28: qty 1

## 2011-09-28 MED ORDER — GI COCKTAIL ~~LOC~~
30.0000 mL | Freq: Once | ORAL | Status: DC
  Filled 2011-09-28: qty 30

## 2011-09-28 MED ORDER — ONDANSETRON 4 MG PO TBDP
4.0000 mg | ORAL_TABLET | Freq: Once | ORAL | Status: AC
  Administered 2011-09-28: 4 mg via ORAL
  Filled 2011-09-28: qty 1

## 2011-09-28 MED ORDER — HYDROMORPHONE HCL PF 1 MG/ML IJ SOLN
1.0000 mg | Freq: Once | INTRAMUSCULAR | Status: AC
  Administered 2011-09-28: 1 mg via INTRAVENOUS
  Filled 2011-09-28: qty 1

## 2011-09-28 MED ORDER — HYDROMORPHONE HCL 2 MG PO TABS
2.0000 mg | ORAL_TABLET | Freq: Once | ORAL | Status: DC
  Filled 2011-09-28: qty 1

## 2011-09-28 NOTE — ED Notes (Signed)
Pt states that she vomited up zofran and if she takes po challenge, her abd pain will increase.  PA notified.

## 2011-09-28 NOTE — Discharge Instructions (Signed)
Please read and follow all provided instructions.  Your diagnoses today include:  1. Chronic abdominal pain    Tests performed today include:  Blood counts and electrolytes  Blood tests to check liver and kidney function  Blood tests to check pancreas function  Vital signs. See below for your results today.   Medications prescribed:   None  Home care instructions:   Follow any educational materials contained in this packet.  Continue home pain and nausea medications as directed  Follow-up instructions: Please follow-up with your primary care provider in the next 2 days for further evaluation of your symptoms. If you do not have a primary care doctor -- see below for referral information.   Return instructions:  SEEK IMMEDIATE MEDICAL ATTENTION IF:  The pain does not go away or becomes severe   A temperature above 101F develops   Repeated vomiting occurs (multiple episodes)   The pain becomes localized to portions of the abdomen. The right side could possibly be appendicitis. In an adult, the left lower portion of the abdomen could be colitis or diverticulitis.   Blood is being passed in stools or vomit (bright red or black tarry stools)   You develop chest pain, difficulty breathing, dizziness or fainting, or become confused, poorly responsive, or inconsolable (young children)  If you have any other emergent concerns regarding your health  Additional Information: Abdominal (belly) pain can be caused by many things. Your caregiver performed an examination and possibly ordered blood/urine tests and imaging (CT scan, x-rays, ultrasound). Many cases can be observed and treated at home after initial evaluation in the emergency department. Even though you are being discharged home, abdominal pain can be unpredictable. Therefore, you need a repeated exam if your pain does not resolve, returns, or worsens. Most patients with abdominal pain don't have to be admitted to the  hospital or have surgery, but serious problems like appendicitis and gallbladder attacks can start out as nonspecific pain. Many abdominal conditions cannot be diagnosed in one visit, so follow-up evaluations are very important.  Your vital signs today were: BP 119/77  Pulse 87  Temp(Src) 98 F (36.7 C) (Oral)  Resp 16  Ht 5\' 5"  (1.651 m)  SpO2 100%  LMP 06/01/2011 If your blood pressure (bp) was elevated above 135/85 this visit, please have this repeated by your doctor within one month. -------------- No Primary Care Doctor Call [facility]  [phone] Other agencies that provide inexpensive medical care    [facility]  [phone]    [facility]  [phone]    [facility]  [phone]    [facility]  [phone]    [facility]  [phone]    [facility]  [phone] -------------- RESOURCE GUIDE:  Dental Problems  Patients with Medicaid: [facility] [phone] [address]                                            [phone] Phone:  [phone]  Phone:  [phone]  If unable to pay or uninsured, contact:  [facility] or [facility]. to become qualified for the adult dental clinic.  Chronic Pain Problems Contact [facility]  [phone] Patients need to be referred by their primary care doctor.  Insufficient Money for Medicine Contact [employer]:  call "211" or [facility] [phone].  Psychological Services [facility]  [phone] [facility]  [phone] [facility]   [phone] (emergency services [phone])  Substance Abuse Resources [facility]  [phone] [facility] [phone] [facility] [phone] Floydene Flock [phone] Residential & Outpatient Substance Abuse Program   [phone]  Abuse/Neglect [facility] Child Abuse Hotline [phone] [facility] Child Abuse Hotline [phone] (After Hours)  Emergency Shelter [facility] [phone]  Maternity Homes Room at the [place] [facility] [place] [phone] [facility] [phone]  [facility] Resources  [facility]     [employer]                          [facility] Dept. [address] [place]                       [address]      [address]  Blondell Reveal Phone:  [phone]                                   Phone:  [phone]                 Phone:  [phone]  [facility] Phone:  [phone]  [facility] Child Abuse Hotline 4256370511 [phone] (After Hours)

## 2011-09-28 NOTE — ED Provider Notes (Signed)
History     CSN: [id number]  Arrival date & time 09/28/11  0904   First MD Initiated Contact with Patient 09/28/11 0935      Chief Complaint  Patient presents with  . Abdominal Pain  . Nausea  . Diarrhea  . Emesis    (Consider location/radiation/quality/duration/timing/severity/associated sxs/prior treatment) HPI Comments: Patient with history of chronic abdominal pain, chronic pancreatitis, cholecystectomy -- presents with her typical abdominal pain. Patient states that his abdominal pain, nausea and vomiting for the past 6 weeks. She is also having diarrhea. Patient takes oral Dilaudi and Zofran at home. She states that her vomiting was worse since approximately 5 AM and she was unable to keep down her medications. She states that she tried suppositories which have not helped. Patient denies blood in her stool or vomit. She also complains of lower back pain but no dysuria or hematuria. No vaginal bleeding or discharge.  Patient is a 31 y.o. female presenting with abdominal pain. The history is provided by the patient.  Abdominal Pain The primary symptoms of the illness include abdominal pain, nausea, vomiting and diarrhea. The primary symptoms of the illness do not include fever, hematemesis, dysuria or vaginal discharge. The current episode started more than 2 days ago. The problem has not changed since onset. Additional symptoms associated with the illness include back pain. Symptoms associated with the illness do not include urgency, hematuria or frequency.    Past Medical History  Diagnosis Date  . Pancreatitis   . S/P laparoscopic cholecystectomy   . Fibroids   . Ovarian cyst   . Chronic abdominal pain   . Anemia     Past Surgical History  Procedure Date  . Cholecystectomy   . Abdominal hysterectomy   . Dilation and curettage of uterus     Family History  Problem Relation Age of Onset  . Diabetes Mother   . Hypertension Mother   . Hypertension Father   .  Diabetes Father   . Asthma Daughter     History  Substance Use Topics  . Smoking status: Current Everyday Smoker -- 0.5 packs/day for 10 years    Types: Cigarettes  . Smokeless tobacco: Never Used   Comment: declined  . Alcohol Use: No    OB History    Grav Para Term Preterm Abortions TAB SAB Ect Mult Living   4 2 1 1 2  2          Review of Systems  Constitutional: Negative for fever.  HENT: Negative for sore throat and rhinorrhea.   Eyes: Negative for redness.  Respiratory: Negative for cough.   Cardiovascular: Negative for chest pain.  Gastrointestinal: Positive for nausea, vomiting, abdominal pain and diarrhea. Negative for blood in stool and hematemesis.  Genitourinary: Negative for dysuria, urgency, frequency, hematuria and vaginal discharge.  Musculoskeletal: Positive for back pain. Negative for myalgias.  Skin: Negative for rash.  Neurological: Negative for headaches.    Allergies  Celecoxib; Ketorolac tromethamine; Meperidine hcl; Fentanyl; Ibuprofen; Morphine; Propoxyphene n-acetaminophen; and Tramadol hcl  Home Medications   Current Outpatient Rx  Name Route Sig Dispense Refill  . HYDROCODONE-ACETAMINOPHEN 5-500 MG PO TABS Oral Take 1 tablet by mouth every 6 (six) hours as needed. For pain    . HYDROMORPHONE HCL 2 MG PO TABS Oral Take 2 mg by mouth every 4 (four) hours as needed. For pain    . ONDANSETRON 8 MG PO TBDP Oral Take 8 mg by mouth every 8 (eight) hours as needed. For  nausea      BP 119/77  Pulse 87  Temp(Src) 98 F (36.7 C) (Oral)  Resp 16  Ht 5\' 5"  (1.651 m)  SpO2 100%  LMP 06/01/2011  Physical Exam  Nursing note and vitals reviewed. Constitutional: She is oriented to person, place, and time. She appears well-developed and well-nourished.  HENT:  Head: Normocephalic and atraumatic.  Eyes: Conjunctivae are normal. Right eye exhibits no discharge. Left eye exhibits no discharge.  Neck: Normal range of motion. Neck supple.    Cardiovascular: Normal rate, regular rhythm and normal heart sounds.   Pulmonary/Chest: Effort normal and breath sounds normal.  Abdominal: Soft. Bowel sounds are normal. There is generalized tenderness. There is no rigidity, no rebound and no guarding.       Healed surgical scars noted on abdomen, from lap cholecystectomy.  Musculoskeletal: She exhibits no edema and no tenderness.  Neurological: She is alert and oriented to person, place, and time.  Skin: Skin is warm and dry.  Psychiatric: She has a normal mood and affect.    ED Course  Procedures (including critical care time)  Labs Reviewed  DIFFERENTIAL - Abnormal; Notable for the following:    Neutrophils Relative 30 (*)    Lymphocytes Relative 52 (*)    All other components within normal limits  COMPREHENSIVE METABOLIC PANEL - Abnormal; Notable for the following:    ALT 42 (*)    Alkaline Phosphatase 147 (*)    All other components within normal limits  LIPASE, BLOOD - Abnormal; Notable for the following:    Lipase 118 (*)    All other components within normal limits  CBC  URINALYSIS, ROUTINE W REFLEX MICROSCOPIC   No results found.   1. Chronic abdominal pain     9:44 AM Patient seen and examined. Work-up initiated. Medications ordered.   Vital signs reviewed and are as follows: Filed Vitals:   09/28/11 0908  BP: 119/77  Pulse: 87  Temp: 98 F (36.7 C)  Resp: 16   Patient appears well. Will give one dose of IV pain medication and antiemetics. Will check labs. Patient will be transitioned to oral medications.  11:12 AM Labs not concerning. Will transition to oral medications. Care management to see.  11:40 AM Patient informed of lab results. Informed she does not have any concerning findings on exam and labs today. Urged patient to follow-up with her primary care physician.   Patient continues to complain of pain and nausea after medication. She appears comfortable playing on cell phone. She has not vomited  in the ED. I offered the patient her prescribed oral pain medication and antiemetic as these is what she will be taking at home. She refuses oral medication. She requests discharge.   The patient was urged to return to the Emergency Department immediately with worsening of current symptoms, worsening abdominal pain, persistent vomiting, blood noted in stools, fever, or any other concerns. The patient verbalized understanding.   MDM  Patient with chronic abdominal pain, pancreatitis. Multiple visits to the ED for same. Vitals are stable, no fever.  No signs of dehydration, tolerating PO's.  Lungs are clear.  No focal abdominal pain, no concern for appendicitis, cholecystitis, pancreatitis, ruptured viscus, UTI, kidney stone, or any other abdominal etiology.  Supportive therapy indicated with return if symptoms worsen.  Patient counseled.         Renne Crigler, [place] 09/28/11 1144

## 2011-09-28 NOTE — ED Provider Notes (Signed)
Medical screening examination/treatment/procedure(s) were performed by non-physician practitioner and as supervising physician I was immediately available for consultation/collaboration.   Dayton Bailiff, MD 09/28/11 1150

## 2011-09-28 NOTE — ED Notes (Signed)
Medications administered with MD at bedside, MD observing patient's response to medication

## 2011-09-28 NOTE — ED Notes (Signed)
Patient is refusing medication at this time.  Pt states that she told MD that she does not want a pill because she think that she will throw it up.  Pt appears to be more comfortable that stated.  PT is resting on the side of her bed, playing games on her phone.  Pt states that her pain is 10/10.  PT has no vomiting at this time but states that she feels like she could vomit.  Pt states that she wants to see another MD.  RN to follow up with PA

## 2011-09-28 NOTE — ED Notes (Signed)
PT. REPORTS CHRONIC GENERALIZED ABDOMINAL PAIN WITH VOMITTING AND DIARRHEA FOR SEVERAL YEARS - SEEN HERE THIS MORNING WITH THE SAME COMPLAINTS PRESCRIBED WITH MEDICATIONS.

## 2011-09-28 NOTE — Progress Notes (Signed)
Per Dr. Jonn Shingles request, community liaison Felicia met with patient to discuss her frequency of ED visits. Per Sunny Schlein, patient stated she has Medicaid and is assigned to [facility] but does not want to go there. She would prefer to go to the Outpatient Clinic here at the hospital. Per liaison, Felicia cannot do anything to assist in the re-assignment; however, she can document a note in her system that the patient wishes to change provider.

## 2011-09-28 NOTE — ED Notes (Signed)
Pt here with abd pain, onset chronic, pt with history of pancreatitis.

## 2011-09-28 NOTE — ED Notes (Signed)
Patient refused oral trial, stating that "I'm not doing that because it's going to come right back up."

## 2011-09-28 NOTE — ED Notes (Signed)
Patient made aware that MD and Sharia Reeve are not going to give any further medication since the patient refused the PO pain medications.  Sharia Reeve states that he discussed with patient that if she did not want po meds, the only other treatment would be discharge.  Pt made aware  Of same at this time.  Pt states that she will come back later and does not want to see Josh or Dr. Brooke Dare.  Discharge instructions reviewed.  Pt made aware that if symptoms worsen or if n/v is uncontrollable to return to ED>

## 2011-09-28 NOTE — ED Provider Notes (Signed)
History     CSN: [id number]  Arrival date & time 09/28/11  1847   First MD Initiated Contact with Patient 09/28/11 2145      Chief Complaint  Patient presents with  . Emesis     HPI  History provided by the patient in recent charts. Patient is a 31 year old female with history of chronic abdominal pain, pancreatitis and anemia who presents with continued complaints of chronic abdominal pain with nausea vomiting and diarrhea. Patient was evaluated for similar complaints earlier this morning. Patient in no concerning findings on lab tests. She was treated in the emergency room and stable for discharge. She has multiple visits for same complaint. Patient states that when she returned home she has continued to be unable to take her medications. This included Zofran ODT and Phenergan suppositories. She reports since continued small amounts of vomiting. She also reports some episodes of diarrhea. Patient denies any fever, chills, sweats. Symptoms are unchanged from previous chronic symptoms. She also reports that she is hopeful to have a new primary care provider after talking with case management earlier today.    Past Medical History  Diagnosis Date  . Pancreatitis   . S/P laparoscopic cholecystectomy   . Fibroids   . Ovarian cyst   . Chronic abdominal pain   . Anemia     Past Surgical History  Procedure Date  . Cholecystectomy   . Abdominal hysterectomy   . Dilation and curettage of uterus     Family History  Problem Relation Age of Onset  . Diabetes Mother   . Hypertension Mother   . Hypertension Father   . Diabetes Father   . Asthma Daughter     History  Substance Use Topics  . Smoking status: Current Everyday Smoker -- 0.5 packs/day for 10 years    Types: Cigarettes  . Smokeless tobacco: Never Used   Comment: declined  . Alcohol Use: No    OB History    Grav Para Term Preterm Abortions TAB SAB Ect Mult Living   4 2 1 1 2  2          Review of Systems    Constitutional: Negative for fever and chills.  Respiratory: Negative for cough and shortness of breath.   Cardiovascular: Negative for chest pain.  Gastrointestinal: Positive for nausea, vomiting, abdominal pain and diarrhea.  Genitourinary: Negative for dysuria, frequency, vaginal bleeding and vaginal discharge.    Allergies  Celecoxib; Ketorolac tromethamine; Meperidine hcl; Fentanyl; Ibuprofen; Morphine; Propoxyphene n-acetaminophen; and Tramadol hcl  Home Medications   Current Outpatient Rx  Name Route Sig Dispense Refill  . HYDROCODONE-ACETAMINOPHEN 5-500 MG PO TABS Oral Take 1 tablet by mouth every 6 (six) hours as needed. For pain    . HYDROMORPHONE HCL 2 MG PO TABS Oral Take 2 mg by mouth every 4 (four) hours as needed. For pain    . ONDANSETRON 8 MG PO TBDP Oral Take 8 mg by mouth every 8 (eight) hours as needed. For nausea      BP 103/67  Pulse 78  Temp(Src) 98 F (36.7 C) (Oral)  Resp 18  SpO2 98%  LMP 06/01/2011  Physical Exam  Nursing note and vitals reviewed. Constitutional: She is oriented to person, place, and time. She appears well-developed and well-nourished. No distress.  HENT:  Head: Normocephalic.  Mouth/Throat: Oropharynx is clear and moist.  Eyes: Conjunctivae are normal.  Neck: Normal range of motion. Neck supple.       No meningeal signs  Cardiovascular: Normal rate and regular rhythm.   Pulmonary/Chest: Effort normal and breath sounds normal. No respiratory distress. She has no wheezes.  Abdominal: Soft. There is tenderness in the right upper quadrant, epigastric area and left upper quadrant. There is no rigidity, no rebound, no guarding and no tenderness at McBurney's point.       Pain is moderate. Obese  Neurological: She is alert and oriented to person, place, and time.  Skin: Skin is warm and dry. No rash noted.       Normal skin turgor  Psychiatric: She has a normal mood and affect. Her behavior is normal.    ED Course  Procedures    Labs Reviewed - No data to display No results found.   1. Chronic abdominal pain   2. Nausea       MDM  10:15PM patient seen and evaluated. Patient in no acute distress. Patient appears comfortable.  She is requesting IV with fluids and IV Dilaudid pain medications. I discussed with patient given her multiple recent visits I would like to first attempt Zofran ODT and GI cocktail with a fluid challenge. If patient fails fluid challenge or has continued episodes of nausea vomiting may consider additional alternative treatments for symptoms. Patient does not appear in any distress and has no signs for nausea or vomiting. Patient sits in the bed playing on her cell phone while talking with me.  Patient has refused to go to x-ray.   Patient has now agreed to take ODT Zofran. Patient with no problems taking Zofran. Medication has dissolved completely. Patient still refuses to by mouth challenge. Patient is not in any distress. She has no vomiting while here.  She continues to refuse any further workup or by mouth challenge. Patient now requesting to see a charge nurse or be seen by another physician who will treat her for pain and symptoms. I discussed case with attending physician. He does not recommend any additional management if patient continuing to refuse additional workup of her symptoms. Her labs this morning were reviewed and there were no significant findings of concern per her baseline. Patient has stable vital signs.  Patient now requesting to leave. She states she wants her papers. States she will send her lawyers on me.   Angus Seller, [place] 09/30/11 [phone]

## 2011-09-28 NOTE — ED Notes (Signed)
Patient reports 6 weeks of abd pain, nausea, and diarrhea.  She tried to see her md but could not

## 2011-09-28 NOTE — ED Notes (Signed)
Patient still refuses to drink anything.

## 2011-09-29 NOTE — Discharge Instructions (Signed)
Please followup with primary care provider for continued evaluation of your chronic symptoms.   Nausea, Adult Nausea is the feeling that you have an upset stomach or have to vomit. Nausea by itself is not likely a serious concern, but it may be an early sign of more serious medical problems. As nausea gets worse, it can lead to vomiting. If vomiting develops, there is the risk of dehydration.  CAUSES   Viral infections.   Food poisoning.   Medicines.   Pregnancy.   Motion sickness.   Migraine headaches.   Emotional distress.   Severe pain from any source.   Alcohol intoxication.  HOME CARE INSTRUCTIONS  Get plenty of rest.   Ask your caregiver about specific rehydration instructions.   Eat small amounts of food and sip liquids more often.   Take all medicines as told by your caregiver.  SEEK MEDICAL CARE IF:  You have not improved after 2 days, or you get worse.   You have a headache.  SEEK IMMEDIATE MEDICAL CARE IF:   You have a fever.   You faint.   You keep vomiting or have blood in your vomit.   You are extremely weak or dehydrated.   You have dark or bloody stools.   You have severe chest or abdominal pain.  MAKE SURE YOU:  Understand these instructions.   Will watch your condition.   Will get help right away if you are not doing well or get worse.  Document Released: 07/29/2004 Document Revised: 06/10/2011 Document Reviewed: 03/03/2011 [facility] Patient Information 2012 [place], [place].   Chronic Pain Chronic pain can be defined as pain that is lasting, off and on, and lasts for 3 to 6 months or longer. Many things cause chronic pain, which can make it difficult to make a discrete diagnosis. There are many treatment options available for chronic pain. However, finding a treatment that works well for you may require trying various approaches until a suitable one is found. CAUSES  In some types of chronic medical conditions, the pain is caused by a  normal pain response within the body. A normal pain response helps the body identify illness or injury and prevent further damage from being done. In these cases, the cause of the pain may be identified and treated, even if it may not be cured completely. Examples of chronic conditions which can cause chronic pain include:  Inflammation of the joints (arthritis).   Back pain or neck pain (including bulging or herniated disks).   Migraine headaches.   Cancer.  In some other types of chronic pain syndromes, the pain is caused by an abnormal pain response within the body. An abnormal pain response is present when there is no ongoing cause (or stimulus) for the pain, or when the cause of the pain is arising from the nerves or nervous system itself. Examples of conditions which can cause chronic pain due to an abnormal pain response include:  Fibromyalgia.   Reflex sympathetic dystrophy (RSD).   Neuropathy (when the nerves themselves are damaged, and may cause pain).  DIAGNOSIS  Your caregiver will help diagnose your condition over time. In many cases, the initial focus will be on excluding conditions that could be causing the pain. Depending on your symptoms, your caregiver may order some tests to diagnose your condition. Some of these tests include:  Blood tests.   Computerized X-ray scans (CT scan).   Computerized magnetic scans (MRI).   X-rays.   Ultrasounds.   Nerve conduction  studies.   Consultation with other physicians or specialists.  TREATMENT  There are many treatment options for people suffering from chronic pain. Finding a treatment that works well may take time.   You may be referred to a pain management specialist.   You may be put on medication to help with the pain. Unfortunately, some medications (such as opiate medications) may not be very effective in cases where chronic pain is due to abnormal pain responses. Finding the right medications can take some time.    Adjunctive therapies may be used to provide additional relief and improve a patient's quality of life. These therapies include:   Mindfulness meditation.   Acupuncture.   Biofeedback.   Cognitive-behavioral therapy.   In certain cases, surgical interventions may be attempted.  HOME CARE INSTRUCTIONS   Make sure you understand these instructions prior to discharge.   Ask any questions and share any further concerns you have with your caregiver prior to discharge.   Take all medications as directed by your caregiver.   Keep all follow-up appointments.  SEEK MEDICAL CARE IF:   Your pain gets worse.   You develop a new pain that was not present before.   You cannot tolerate any medications prescribed by your caregiver.   You develop new symptoms since your last visit with your caregiver.  SEEK IMMEDIATE MEDICAL CARE IF:   You develop muscular weakness.   You have decreased sensation or numbness.   You lose control of bowel or bladder function.   Your pain suddenly gets much worse.   You have an oral temperature above 102 F (38.9 C), not controlled by medication.   You develop shaking chills, confusion, chest pain, or shortness of breath.  Document Released: 03/13/2002 Document Revised: 06/10/2011 Document Reviewed: 06/19/2008 [employer] Patient Information 2012 [place], [place].    RESOURCE GUIDE  Dental Problems  Patients with Medicaid: [facility] [phone] [address]                                           [phone] [address] Phone:  [phone]                                                  Phone:  [phone]  If unable to pay or uninsured, contact:  [facility] or [facility]. to become qualified for the adult dental clinic.  Chronic Pain Problems Contact [facility]  [phone] Patients need to be referred by their primary care doctor.  Insufficient Money for  Medicine Contact United Way:  call "211" or [facility] (716) 249-2483.  No Primary Care Doctor Call [facility]  (727)688-1774 Other agencies that provide inexpensive medical care    [facility]  [phone]    [facility]  [phone]    [facility]  [phone]    [facility]  [phone]    [facility]  [phone])319-8985    [facility]  [phone]  Psychological Services [facility]  [phone] [place] [facility]  [phone] [facility]  800 G6628420 (emergency services 5873064794)  Substance Abuse Resources [facility]  346-408-0995 [facility] [phone] [facility] [phone] [facility] [phone] Residential & Outpatient Substance Abuse Program  ([phone]  Abuse/Neglect [facility] Child Abuse Hotline 873-037-3813 [facility] Child Abuse Hotline 505 764 7223 (After Hours)  Emergency Shelter [facility] 514-221-9689  Maternity Homes Room at the [place] [facility] [place] 919-837-7295 [facility] [phone]  MRSA Hotline #:   (937)527-4980    [facility] Resources  [facility]     United [employer]                          [facility] Dept. [address] [place]                       [address]      [address]  Blondell Reveal Phone:  [phone]                                   Phone:  [phone]                 Phone:  [phone]  [facility] Phone:  ([phone]  [facility] Child Abuse Hotline (351)466-5671 [phone] (After Hours)

## 2011-09-29 NOTE — ED Notes (Signed)
Pt is angry and asking for discharge papers.

## 2011-09-29 NOTE — ED Notes (Signed)
Pt wanted to see charge RN.  Consulting civil engineer notified.  Pt did not want to wait for charge RN and took discharge papers, but did not sign.

## 2011-09-30 ENCOUNTER — Emergency Department (HOSPITAL_COMMUNITY)
Admission: EM | Admit: 2011-09-30 | Discharge: 2011-09-30 | Disposition: A | Discharge status: Home / Self Care | Payer: Medicaid Other | Attending: Emergency Medicine | Admitting: Emergency Medicine

## 2011-09-30 ENCOUNTER — Encounter (HOSPITAL_COMMUNITY): Payer: Self-pay | Admitting: *Deleted

## 2011-09-30 DIAGNOSIS — R10816 Epigastric abdominal tenderness: Secondary | ICD-10-CM | POA: Insufficient documentation

## 2011-09-30 DIAGNOSIS — R112 Nausea with vomiting, unspecified: Secondary | ICD-10-CM | POA: Insufficient documentation

## 2011-09-30 DIAGNOSIS — R197 Diarrhea, unspecified: Secondary | ICD-10-CM | POA: Insufficient documentation

## 2011-09-30 DIAGNOSIS — M549 Dorsalgia, unspecified: Secondary | ICD-10-CM | POA: Insufficient documentation

## 2011-09-30 DIAGNOSIS — Z79899 Other long term (current) drug therapy: Secondary | ICD-10-CM | POA: Insufficient documentation

## 2011-09-30 DIAGNOSIS — R109 Unspecified abdominal pain: Secondary | ICD-10-CM | POA: Insufficient documentation

## 2011-09-30 DIAGNOSIS — F17200 Nicotine dependence, unspecified, uncomplicated: Secondary | ICD-10-CM | POA: Insufficient documentation

## 2011-09-30 DIAGNOSIS — R7989 Other specified abnormal findings of blood chemistry: Secondary | ICD-10-CM | POA: Insufficient documentation

## 2011-09-30 LAB — DIFFERENTIAL
Basophils Absolute: 0.1 10*3/uL (ref 0.0–0.1)
Eosinophils Absolute: 0.4 10*3/uL (ref 0.0–0.7)
Monocytes Absolute: 0.4 10*3/uL (ref 0.1–1.0)
Neutrophils Relative %: 37 % — ABNORMAL LOW (ref 43–77)

## 2011-09-30 LAB — COMPREHENSIVE METABOLIC PANEL
AST: 43 U/L — ABNORMAL HIGH (ref 0–37)
Albumin: 3.8 g/dL (ref 3.5–5.2)
BUN: 14 mg/dL (ref 6–23)
Calcium: 9.4 mg/dL (ref 8.4–10.5)
Creatinine, Ser: 0.7 mg/dL (ref 0.50–1.10)

## 2011-09-30 LAB — CBC
MCH: 27.5 pg (ref 26.0–34.0)
MCHC: 33.6 g/dL (ref 30.0–36.0)
Platelets: 221 10*3/uL (ref 150–400)
RBC: 4.22 MIL/uL (ref 3.87–5.11)
RDW: 12.8 % (ref 11.5–15.5)

## 2011-09-30 LAB — LIPASE, BLOOD: Lipase: 55 U/L (ref 11–59)

## 2011-09-30 MED ORDER — DIPHENHYDRAMINE HCL 50 MG/ML IJ SOLN
25.0000 mg | Freq: Once | INTRAMUSCULAR | Status: AC
  Administered 2011-09-30: 25 mg via INTRAVENOUS
  Filled 2011-09-30: qty 1

## 2011-09-30 MED ORDER — SODIUM CHLORIDE 0.9 % IV BOLUS (SEPSIS)
1000.0000 mL | Freq: Once | INTRAVENOUS | Status: AC
  Administered 2011-09-30: 1000 mL via INTRAVENOUS

## 2011-09-30 MED ORDER — ONDANSETRON HCL 4 MG/2ML IJ SOLN
4.0000 mg | Freq: Once | INTRAMUSCULAR | Status: AC
  Administered 2011-09-30: 4 mg via INTRAVENOUS
  Filled 2011-09-30: qty 2

## 2011-09-30 MED ORDER — METOCLOPRAMIDE HCL 10 MG PO TABS: 10.0000 mg | ORAL_TABLET | Freq: Four times a day (QID) | ORAL | Status: DC | PRN

## 2011-09-30 MED ORDER — HYDROMORPHONE HCL PF 1 MG/ML IJ SOLN
0.5000 mg | Freq: Once | INTRAMUSCULAR | Status: AC
  Administered 2011-09-30: 0.5 mg via INTRAVENOUS
  Filled 2011-09-30: qty 1

## 2011-09-30 NOTE — ED Provider Notes (Signed)
Medical screening examination/treatment/procedure(s) were performed by non-physician practitioner and as supervising physician I was immediately available for consultation/collaboration.   Shakema Surita Y. Marca Gadsby, MD 09/30/11 0645 

## 2011-09-30 NOTE — ED Notes (Signed)
Pt reports abd pain with n/v/d x 7 weeks.  Reports she was admitted at [facility] in the beginning of the month and was sent home with dialudid and phenergan PO but is not able to take them without vomiting.

## 2011-09-30 NOTE — Discharge Instructions (Signed)
Your blood tests showed a normal lipase today which means your pancreas is no longer inflamed. There is a minor increase in your liver tests and this needs to be followed. He will need to make followup appointments to have the endoscopy to try and identify what the cause of your pain is. In the meantime, a prescription has been given for metoclopramide to try and help control your nausea.  Abdominal Pain Abdominal pain can be caused by many things. Your caregiver decides the seriousness of your pain by an examination and possibly blood tests and X-rays. Many cases can be observed and treated at home. Most abdominal pain is not caused by a disease and will probably improve without treatment. However, in many cases, more time must pass before a clear cause of the pain can be found. Before that point, it may not be known if you need more testing, or if hospitalization or surgery is needed. HOME CARE INSTRUCTIONS   Do not take laxatives unless directed by your caregiver.   Take pain medicine only as directed by your caregiver.   Only take over-the-counter or prescription medicines for pain, discomfort, or fever as directed by your caregiver.   Try a clear liquid diet (broth, tea, or water) for as long as directed by your caregiver. Slowly move to a bland diet as tolerated.  SEEK IMMEDIATE MEDICAL CARE IF:   The pain does not go away.   You have a fever.   You keep throwing up (vomiting).   The pain is felt only in portions of the abdomen. Pain in the right side could possibly be appendicitis. In an adult, pain in the left lower portion of the abdomen could be colitis or diverticulitis.   You pass bloody or black tarry stools.  MAKE SURE YOU:   Understand these instructions.   Will watch your condition.   Will get help right away if you are not doing well or get worse.  Document Released: 03/31/2005 Document Revised: 06/10/2011 Document Reviewed: 02/07/2008 [facility] Patient Information  2012 [place], [place].  Metoclopramide tablets What is this medicine? METOCLOPRAMIDE (met oh kloe PRA mide) is used to treat the symptoms of gastroesophageal reflux disease (GERD) like heartburn. It is also used to treat people with slow emptying of the stomach and intestinal tract. This medicine may be used for other purposes; ask your health care provider or pharmacist if you have questions. What should I tell my health care provider before I take this medicine? They need to know if you have any of these conditions: -breast cancer -depression -diabetes -heart failure -high blood pressure -kidney disease -liver disease -Parkinson's disease or a movement disorder -pheochromocytoma -seizures -stomach obstruction, bleeding, or perforation -an unusual or allergic reaction to metoclopramide, procainamide, sulfites, other medicines, foods, dyes, or preservatives -pregnant or trying to get pregnant -breast-feeding How should I use this medicine? Take this medicine by mouth with a glass of water. Follow the directions on the prescription label. Take this medicine on an empty stomach, about 30 minutes before eating. Take your doses at regular intervals. Do not take your medicine more often than directed. Do not stop taking except on the advice of your doctor or health care professional. A special MedGuide will be given to you by the pharmacist with each prescription and refill. Be sure to read this information carefully each time. Talk to your pediatrician regarding the use of this medicine in children. Special care may be needed. Overdosage: If you think you have taken too  much of this medicine contact a poison control center or emergency room at once. NOTE: This medicine is only for you. Do not share this medicine with others. What if I miss a dose? If you miss a dose, take it as soon as you can. If it is almost time for your next dose, take only that dose. Do not take double or extra  doses. What may interact with this medicine? -acetaminophen -cyclosporine -digoxin -medicines for blood pressure -medicines for diabetes, including insulin -medicines for hay fever and other allergies -medicines for depression, especially an Monoamine Oxidase Inhibitor (MAOI) -medicines for Parkinson's disease, like levodopa -medicines for sleep or for pain -tetracycline This list may not describe all possible interactions. Give your health care provider a list of all the medicines, herbs, non-prescription drugs, or dietary supplements you use. Also tell them if you smoke, drink alcohol, or use illegal drugs. Some items may interact with your medicine. What should I watch for while using this medicine? It may take a few weeks for your stomach condition to start to get better. However, do not take this medicine for longer than 12 weeks. The longer you take this medicine, and the more you take it, the greater your chances are of developing serious side effects. If you are an elderly patient, a female patient, or you have diabetes, you may be at an increased risk for side effects from this medicine. Contact your doctor immediately if you start having movements you cannot control such as lip smacking, rapid movements of the tongue, involuntary or uncontrollable movements of the eyes, head, arms and legs, or muscle twitches and spasms. Patients and their families should watch out for worsening depression or thoughts of suicide. Also watch out for any sudden or severe changes in feelings such as feeling anxious, agitated, panicky, irritable, hostile, aggressive, impulsive, severely restless, overly excited and hyperactive, or not being able to sleep. If this happens, especially at the beginning of treatment or after a change in dose, call your doctor. Do not treat yourself for high fever. Ask your doctor or health care professional for advice. You may get drowsy or dizzy. Do not drive, use machinery, or  do anything that needs mental alertness until you know how this drug affects you. Do not stand or sit up quickly, especially if you are an older patient. This reduces the risk of dizzy or fainting spells. Alcohol can make you more drowsy and dizzy. Avoid alcoholic drinks. What side effects may I notice from receiving this medicine? Side effects that you should report to your doctor or health care professional as soon as possible: -allergic reactions like skin rash, itching or hives, swelling of the face, lips, or tongue -abnormal production of milk in females -breast enlargement in both males and females -change in the way you walk -difficulty moving, speaking or swallowing -drooling, lip smacking, or rapid movements of the tongue -excessive sweating -fever -involuntary or uncontrollable movements of the eyes, head, arms and legs -irregular heartbeat or palpitations -muscle twitches and spasms -unusually weak or tired Side effects that usually do not require medical attention (report to your doctor or health care professional if they continue or are bothersome): -change in sex drive or performance -depressed mood -diarrhea -difficulty sleeping -headache -menstrual changes -restless or nervous This list may not describe all possible side effects. Call your doctor for medical advice about side effects. You may report side effects to FDA at 1-800-FDA-1088. Where should I keep my medicine? Keep out of the  reach of children. Store at room temperature between 20 and 25 degrees C (68 and 77 degrees F). Protect from light. Keep container tightly closed. Throw away any unused medicine after the expiration date. NOTE: This sheet is a summary. It may not cover all possible information. If you have questions about this medicine, talk to your doctor, pharmacist, or health care provider.  2012, [employer]. (02/14/2008 4:30:05 PM)

## 2011-09-30 NOTE — ED Provider Notes (Signed)
History     CSN: [id number]  Arrival date & time 09/30/11  1819   First MD Initiated Contact with Patient 09/30/11 1929      Chief Complaint  Patient presents with  . Abdominal Pain    n/v/d    (Consider location/radiation/quality/duration/timing/severity/associated sxs/prior treatment) Patient is a 31 y.o. female presenting with abdominal pain. The history is provided by the patient.  Abdominal Pain The primary symptoms of the illness include abdominal pain.  She has been having upper abdominal pain radiating through to the back for the last 7 days. This associated with episodes of vomiting and diarrhea. She states she's not able to hold anything down. She had been admitted to the hospital about 4 weeks ago and states that the surgeon is supposed to do an endoscopy to see if there is a problem with her ducts. She missed an appointment with a surgeon because her son was in the hospital. She was prescribed Dilaudid-HP which she is not able to hold down. She was prescribed antiemetic tablets and suppositories which have not been effective. Symptoms are described as severe. Pain is 10 out of 10 and nothing makes it better. Also, nothing makes it worse. Pain is not affected by vomiting or having a bowel movement. She states that she still has the Dilaudid which was prescribed for her when she was discharged.  Past Medical History  Diagnosis Date  . Pancreatitis   . S/P laparoscopic cholecystectomy   . Fibroids   . Ovarian cyst   . Chronic abdominal pain   . Anemia     Past Surgical History  Procedure Date  . Cholecystectomy   . Abdominal hysterectomy   . Dilation and curettage of uterus     Family History  Problem Relation Age of Onset  . Diabetes Mother   . Hypertension Mother   . Hypertension Father   . Diabetes Father   . Asthma Daughter     History  Substance Use Topics  . Smoking status: Current Everyday Smoker -- 0.5 packs/day for 10 years    Types: Cigarettes  .  Smokeless tobacco: Never Used   Comment: declined  . Alcohol Use: No    OB History    Grav Para Term Preterm Abortions TAB SAB Ect Mult Living   4 2 1 1 2  2          Review of Systems  Gastrointestinal: Positive for abdominal pain.  All other systems reviewed and are negative.    Allergies  Celecoxib; Ketorolac tromethamine; Meperidine hcl; Fentanyl; Ibuprofen; Morphine; Propoxacet-n; and Tramadol hcl  Home Medications   Current Outpatient Rx  Name Route Sig Dispense Refill  . DIPHENHYDRAMINE HCL 12.5 MG/5ML PO LIQD Oral Take 25 mg by mouth 4 (four) times daily as needed. For itching.    Marland Kitchen HYDROCODONE-ACETAMINOPHEN 5-500 MG PO TABS Oral Take 1 tablet by mouth every 6 (six) hours as needed. For pain    . HYDROMORPHONE HCL 2 MG PO TABS Oral Take 2 mg by mouth every 4 (four) hours as needed. For pain    . ONDANSETRON 8 MG PO TBDP Oral Take 8 mg by mouth every 8 (eight) hours as needed. For nausea      BP 116/80  Pulse 76  Temp 98.5 F (36.9 C)  Resp 18  SpO2 99%  LMP 06/01/2011  Physical Exam  Nursing note and vitals reviewed.  31 year old female who is resting comfortably in no acute distress. Vital signs are normal. Head is  normocephalic and atraumatic. PERRLA, EOMI. There is no scleral icterus. Mucous members are moist. Neck is nontender and supple without adenopathy. Back is nontender. Lungs are clear without rales, wheezes, or rhonchi. Heart has regular rate and rhythm without murmur. No tachycardia is present. Abdomen is soft, flat, with moderate epigastric tenderness. No rebound or guarding. Peristalsis is diminished. Extremities have no cyanosis or edema, full range of motion is present. Skin is warm and dry without rash. Neurologic: Mental status is normal, cranial nerves are intact, there no focal motor or sensory deficits.  ED Course  Procedures (including critical care time)  Results for orders placed during the hospital encounter of 09/30/11  CBC       Component Value Range   WBC 5.9  4.0 - 10.5 (K/uL)   RBC 4.22  3.87 - 5.11 (MIL/uL)   Hemoglobin 11.6 (*) 12.0 - 15.0 (g/dL)   HCT 08.6 (*) 57.8 - 46.0 (%)   MCV 81.8  78.0 - 100.0 (fL)   MCH 27.5  26.0 - 34.0 (pg)   MCHC 33.6  30.0 - 36.0 (g/dL)   RDW 46.9  62.9 - 52.8 (%)   Platelets 221  150 - 400 (K/uL)  DIFFERENTIAL      Component Value Range   Neutrophils Relative 37 (*) 43 - 77 (%)   Lymphocytes Relative 49 (*) 12 - 46 (%)   Monocytes Relative 7  3 - 12 (%)   Eosinophils Relative 6 (*) 0 - 5 (%)   Basophils Relative 1  0 - 1 (%)   Neutro Abs 2.2  1.7 - 7.7 (K/uL)   Lymphs Abs 2.8  0.7 - 4.0 (K/uL)   Monocytes Absolute 0.4  0.1 - 1.0 (K/uL)   Eosinophils Absolute 0.4  0.0 - 0.7 (K/uL)   Basophils Absolute 0.1  0.0 - 0.1 (K/uL)   WBC Morphology ATYPICAL LYMPHOCYTES    LIPASE, BLOOD      Component Value Range   Lipase 55  11 - 59 (U/L)  COMPREHENSIVE METABOLIC PANEL      Component Value Range   Sodium 137  135 - 145 (mEq/L)   Potassium 4.2  3.5 - 5.1 (mEq/L)   Chloride 103  96 - 112 (mEq/L)   CO2 25  19 - 32 (mEq/L)   Glucose, Bld 84  70 - 99 (mg/dL)   BUN 14  6 - 23 (mg/dL)   Creatinine, Ser 4.13  0.50 - 1.10 (mg/dL)   Calcium 9.4  8.4 - 24.4 (mg/dL)   Total Protein 8.0  6.0 - 8.3 (g/dL)   Albumin 3.8  3.5 - 5.2 (g/dL)   AST 43 (*) 0 - 37 (U/L)   ALT 70 (*) 0 - 35 (U/L)   Alkaline Phosphatase 163 (*) 39 - 117 (U/L)   Total Bilirubin 0.3  0.3 - 1.2 (mg/dL)   GFR calc non Af Amer >90  >90 (mL/min)   GFR calc Af Amer >90  >90 (mL/min)   Laboratory testing today shows a normalization of her lipase and a very slight increase in her liver function tests. There is absolutely no indication for repeat imaging. Her abdomen is soft and benign and WBC is normal. She is persistently asking for pain medication. At this point, I see no indication for hospitalization. In spite of her claim of vomiting constantly, her BUN is only 14. She will be given a prescription for Reglan and  discharged with instructions to followup with her gastroenterologist or surgeon.  1. Abdominal pain   2. Elevated liver function tests   3. Nausea and vomiting       MDM  Chronic abdominal pain. I have reviewed the patient's records and this is her sevenths ED visit in the last 9 days including 2 visits on March 26. Gastroenterology consultation when she was in the hospital reference to possible spasm of the sphincter of OD and need to refer her to a tertiary care facility for further evaluation. It was noted that she had abnormal liver function tests of these will be repeated today as well as a repeat lipase.        Dione Booze, MD 09/30/11 2215

## 2011-10-03 ENCOUNTER — Emergency Department (HOSPITAL_COMMUNITY)
Admission: EM | Admit: 2011-10-03 | Discharge: 2011-10-04 | Discharge status: Against Medical Advice | Payer: Medicaid Other | Attending: Emergency Medicine | Admitting: Emergency Medicine

## 2011-10-03 ENCOUNTER — Encounter (HOSPITAL_COMMUNITY): Payer: Self-pay | Admitting: *Deleted

## 2011-10-03 DIAGNOSIS — D649 Anemia, unspecified: Secondary | ICD-10-CM | POA: Insufficient documentation

## 2011-10-03 DIAGNOSIS — Z765 Malingerer [conscious simulation]: Secondary | ICD-10-CM

## 2011-10-03 DIAGNOSIS — R109 Unspecified abdominal pain: Secondary | ICD-10-CM | POA: Insufficient documentation

## 2011-10-03 DIAGNOSIS — G8929 Other chronic pain: Secondary | ICD-10-CM

## 2011-10-03 DIAGNOSIS — F17200 Nicotine dependence, unspecified, uncomplicated: Secondary | ICD-10-CM | POA: Insufficient documentation

## 2011-10-03 MED ORDER — ONDANSETRON HCL 4 MG/2ML IJ SOLN
4.0000 mg | Freq: Once | INTRAMUSCULAR | Status: DC
  Filled 2011-10-03: qty 2

## 2011-10-03 MED ORDER — SODIUM CHLORIDE 0.9 % IV BOLUS (SEPSIS)
1000.0000 mL | Freq: Once | INTRAVENOUS | Status: AC
  Administered 2011-10-04: 1000 mL via INTRAVENOUS

## 2011-10-03 NOTE — ED Notes (Signed)
Pt to ED c/o LUQ pain with nausea and vomiting.  St's unable to keep anything down.

## 2011-10-03 NOTE — ED Provider Notes (Signed)
History    30yF with abdominal pain. Onset weeks ago with multiple evaluations. Says pain in upper abdomen and relatively constant. No appreciable exacerbating factors. Has been taking pain meds and antiemetics at home. Nausea and can't keep anything down. No fever ro chills. No urinary complaints. Has not yet followed up as recommended to her on recent visits.  CSN: [id number]  Arrival date & time 10/03/11  2049   First MD Initiated Contact with Patient 10/03/11 2334      Chief Complaint  Patient presents with  . multiple symptoms     (Consider location/radiation/quality/duration/timing/severity/associated sxs/prior treatment) HPI  Past Medical History  Diagnosis Date  . Pancreatitis   . S/P laparoscopic cholecystectomy   . Fibroids   . Ovarian cyst   . Chronic abdominal pain   . Anemia     Past Surgical History  Procedure Date  . Cholecystectomy   . Abdominal hysterectomy   . Dilation and curettage of uterus     Family History  Problem Relation Age of Onset  . Diabetes Mother   . Hypertension Mother   . Hypertension Father   . Diabetes Father   . Asthma Daughter     History  Substance Use Topics  . Smoking status: Current Everyday Smoker -- 0.5 packs/day for 10 years    Types: Cigarettes  . Smokeless tobacco: Never Used   Comment: declined  . Alcohol Use: No    OB History    Grav Para Term Preterm Abortions TAB SAB Ect Mult Living   4 2 1 1 2  2          Review of Systems   Review of symptoms negative unless otherwise noted in HPI.   Allergies  Celecoxib; Ketorolac tromethamine; Meperidine hcl; Fentanyl; Ibuprofen; Morphine; Propoxacet-n; and Tramadol hcl  Home Medications   Current Outpatient Rx  Name Route Sig Dispense Refill  . DIPHENHYDRAMINE HCL 12.5 MG/5ML PO LIQD Oral Take 25 mg by mouth 4 (four) times daily as needed. For itching.    Marland Kitchen HYDROCODONE-ACETAMINOPHEN 5-500 MG PO TABS Oral Take 1 tablet by mouth every 6 (six) hours as needed.  For pain    . HYDROMORPHONE HCL 2 MG PO TABS Oral Take 2 mg by mouth every 4 (four) hours as needed. For pain    . METOCLOPRAMIDE HCL 10 MG PO TABS Oral Take 10 mg by mouth every 6 (six) hours as needed. For nausea    . ONDANSETRON 8 MG PO TBDP Oral Take 8 mg by mouth every 8 (eight) hours as needed. For nausea      BP 117/82  Pulse 64  Temp(Src) 98.2 F (36.8 C) (Oral)  Resp 16  SpO2 100%  LMP 06/01/2011  Physical Exam  Nursing note and vitals reviewed. Constitutional: She appears well-developed and well-nourished. No distress.       Laying in bed. NAD. Had to be asked to stop using phone so could take history.  HENT:  Head: Normocephalic and atraumatic.  Eyes: Conjunctivae are normal. Pupils are equal, round, and reactive to light. Right eye exhibits no discharge. Left eye exhibits no discharge.  Neck: Neck supple.  Cardiovascular: Normal rate, regular rhythm and normal heart sounds.  Exam reveals no gallop and no friction rub.   No murmur heard. Pulmonary/Chest: Effort normal and breath sounds normal. No respiratory distress.  Abdominal: Soft. She exhibits no distension and no mass. There is no tenderness.       Well healed laparoscopic incisions.  Genitourinary:  No cva tenderness  Musculoskeletal: She exhibits no edema and no tenderness.  Neurological: She is alert.  Skin: Skin is warm and dry. She is not diaphoretic.  Psychiatric: She has a normal mood and affect. Her behavior is normal. Thought content normal.    ED Course  Procedures (including critical care time)   Labs Reviewed  POCT I-STAT, CHEM 8  LAB REPORT - SCANNED   No results found.   1. Chronic abdominal pain   2. Drug-seeking behavior       MDM  30yF with abdominal pain and n/v. Pt claims that she has not been able to keep anything down for over a week. Is clinically well hydrated and HR is 60-80s. Numerous ER visits recently for similar complaints and has yet to follow-up as previously  recommended to her. Labs from last vistt a couple days ago reviewed and fairly unremarkable aside from mild elevation in LFTs. istat chem checked but with benign abdominal exam and no significant change in symptoms since last evaluation do not feel that further labs or imaging indicated at this time. Explained to pt that would not be receiving narcotic pain medication. Claims allergies to NSAIDs when offered. Despite claiming ongoing vomiting, patient did not have any witnessed episodes of emesis in the emergency room. She refused Zofran. I refused narcotics. Pt left AMA.        Raeford Razor, MD 10/06/11 2225

## 2011-10-03 NOTE — ED Notes (Signed)
abd pain nv headache for 6 weeks.  She comes  Frequently with the same symptoms.  lmp  hyst

## 2011-10-04 LAB — POCT I-STAT, CHEM 8
BUN: 17 mg/dL (ref 6–23)
Calcium, Ion: 1.16 mmol/L (ref 1.12–1.32)
Chloride: 109 mEq/L (ref 96–112)
Creatinine, Ser: 0.8 mg/dL (ref 0.50–1.10)
Glucose, Bld: 86 mg/dL (ref 70–99)
HCT: 37 % (ref 36.0–46.0)
Hemoglobin: 12.6 g/dL (ref 12.0–15.0)
Potassium: 4.2 mEq/L (ref 3.5–5.1)
Sodium: 141 mEq/L (ref 135–145)
TCO2: 23 mmol/L (ref 0–100)

## 2011-10-04 NOTE — ED Notes (Signed)
Pt st's does not need Zofran at this time.

## 2011-10-04 NOTE — ED Notes (Signed)
Pt upset and states she needs pain medication - pt requesting to leave AMA at this time.

## 2011-10-05 ENCOUNTER — Emergency Department (HOSPITAL_COMMUNITY)
Admission: EM | Admit: 2011-10-05 | Discharge: 2011-10-05 | Disposition: A | Discharge status: Home / Self Care | Payer: Medicaid Other | Attending: Emergency Medicine | Admitting: Emergency Medicine

## 2011-10-05 ENCOUNTER — Encounter (HOSPITAL_COMMUNITY): Payer: Self-pay | Admitting: *Deleted

## 2011-10-05 ENCOUNTER — Encounter (HOSPITAL_COMMUNITY): Payer: Self-pay | Admitting: Emergency Medicine

## 2011-10-05 ENCOUNTER — Emergency Department (HOSPITAL_COMMUNITY)
Admission: EM | Admit: 2011-10-05 | Discharge: 2011-10-06 | Disposition: A | Discharge status: Home / Self Care | Payer: Medicaid Other | Source: Home / Self Care | Attending: Emergency Medicine | Admitting: Emergency Medicine

## 2011-10-05 DIAGNOSIS — R112 Nausea with vomiting, unspecified: Secondary | ICD-10-CM | POA: Insufficient documentation

## 2011-10-05 DIAGNOSIS — R109 Unspecified abdominal pain: Secondary | ICD-10-CM | POA: Insufficient documentation

## 2011-10-05 DIAGNOSIS — G8929 Other chronic pain: Secondary | ICD-10-CM | POA: Insufficient documentation

## 2011-10-05 DIAGNOSIS — R10811 Right upper quadrant abdominal tenderness: Secondary | ICD-10-CM | POA: Insufficient documentation

## 2011-10-05 LAB — DIFFERENTIAL
Basophils Relative: 1 % (ref 0–1)
Eosinophils Relative: 5 % (ref 0–5)
Monocytes Absolute: 0.4 10*3/uL (ref 0.1–1.0)
Monocytes Relative: 8 % (ref 3–12)
Neutro Abs: 1.4 10*3/uL — ABNORMAL LOW (ref 1.7–7.7)

## 2011-10-05 LAB — BASIC METABOLIC PANEL
BUN: 15 mg/dL (ref 6–23)
Chloride: 107 mEq/L (ref 96–112)
GFR calc non Af Amer: >90 mL/min (ref >90)
Glucose, Bld: 87 mg/dL (ref 70–99)
Potassium: 4.5 mEq/L (ref 3.5–5.1)

## 2011-10-05 LAB — CBC
HCT: 35.7 % — ABNORMAL LOW (ref 36.0–46.0)
Hemoglobin: 11.9 g/dL — ABNORMAL LOW (ref 12.0–15.0)
MCHC: 33.3 g/dL (ref 30.0–36.0)
MCV: 81 fL (ref 78.0–100.0)

## 2011-10-05 MED ORDER — ONDANSETRON 8 MG PO TBDP
8.0000 mg | ORAL_TABLET | Freq: Once | ORAL | Status: DC
  Filled 2011-10-05: qty 1

## 2011-10-05 NOTE — ED Provider Notes (Signed)
Medical screening examination/treatment/procedure(s) were performed by non-physician practitioner and as supervising physician I was immediately available for consultation/collaboration.   Gerhard Munch, MD 10/05/11 [phone]

## 2011-10-05 NOTE — ED Notes (Signed)
Pt states she is having ruq abdominal pain. Pt states pain has been going on for about 8 weeks. Pt state she went to [facility] on the 3/31 and states the md at [facility] put in the wrong test. Pt c/o n/v

## 2011-10-05 NOTE — Discharge Instructions (Signed)
   Abdominal Pain  Your exam might not show the exact reason you have abdominal pain. Since there are many different causes of abdominal pain, another checkup and more tests may be needed. It is very important to follow up for lasting (persistent) or worsening symptoms. A possible cause of abdominal pain in any person who still has his or her appendix is acute appendicitis. Appendicitis is often hard to diagnose. Normal blood tests, urine tests, ultrasound, and CT scans do not completely rule out early appendicitis or other causes of abdominal pain. Sometimes, only the changes that happen over time will allow appendicitis and other causes of abdominal pain to be determined. Other potential problems that may require surgery may also take time to become more apparent. Because of this, it is important that you follow all of the instructions below.   HOME CARE INSTRUCTIONS  Do not take laxatives unless directed by your caregiver. Rest as much as possible.  Do not eat solid food until your pain is gone: A diet of water, weak decaffeinated tea, broth or bouillon, gelatin, oral rehydration solutions (ORS), frozen ice pops, or ice chips may be helpful.  When pain is gone: Start a light diet (dry toast, crackers, applesauce, or white rice). Increase the diet slowly as long as it does not bother you. Eat no dairy products (including cheese and eggs) and no spicy, fatty, fried, or high-fiber foods.  Use no alcohol, caffeine, or cigarettes.  Take your regular medicines unless your caregiver told you not to.  Take any prescribed medicine as directed.   SEEK IMMEDIATE MEDICAL CARE IF:  The pain does not go away.  You have a fever >101 that persists You keep throwing up (vomiting) or cannot drink liquids.  The pain becomes localized (Pain in the right side could possibly be appendicitis. In an adult, pain in the left lower portion of the abdomen could be colitis or diverticulitis). You pass bloody or black tarry  stools.  You have shaking chills.  There is blood in your vomit or you see blood in your bowel movements.  Your bowel movements stop (become blocked) or you cannot pass gas.  You have bloody, frequent, or painful urination.  You have yellow discoloration in the skin or whites of the eyes.  Your stomach becomes bloated or bigger.  You have dizziness or fainting.  You have chest or back pain.

## 2011-10-05 NOTE — ED Notes (Signed)
Pt was on her cell phone while answering questions. Pt was playing games on her cell phone

## 2011-10-05 NOTE — ED Provider Notes (Signed)
History     CSN: [id number]  Arrival date & time 10/05/11  1150   First MD Initiated Contact with Patient 10/05/11 1421      Chief Complaint  Patient presents with  . Abdominal Pain    (Consider location/radiation/quality/duration/timing/severity/associated sxs/prior treatment) HPI Comments: Patient with a history of chronic abdominal pain, narcotic seeking, and pancreatitis presents to the emergency department with right upper quadrant abdominal pain.  Onset of symptoms was 8 weeks ago, symptoms have been progressively worsening, associated with nausea and vomiting, pain severity 9 of 10 and does not radiate, described as stabbing.  Patient has been evaluated 7 times in this emergency department for similar complaints.   Patient is a 31 y.o. female presenting with abdominal pain. The history is provided by the patient.  Abdominal Pain The primary symptoms of the illness include abdominal pain, nausea and vomiting. The primary symptoms of the illness do not include fever, shortness of breath, diarrhea or dysuria.  Symptoms associated with the illness do not include chills, constipation, urgency or hematuria.    Past Medical History  Diagnosis Date  . Pancreatitis   . S/P laparoscopic cholecystectomy   . Fibroids   . Ovarian cyst   . Chronic abdominal pain   . Anemia     Past Surgical History  Procedure Date  . Cholecystectomy   . Abdominal hysterectomy   . Dilation and curettage of uterus     Family History  Problem Relation Age of Onset  . Diabetes Mother   . Hypertension Mother   . Hypertension Father   . Diabetes Father   . Asthma Daughter     History  Substance Use Topics  . Smoking status: Current Everyday Smoker -- 0.5 packs/day for 10 years    Types: Cigarettes  . Smokeless tobacco: Never Used   Comment: declined  . Alcohol Use: No    OB History    Grav Para Term Preterm Abortions TAB SAB Ect Mult Living   4 2 1 1 2  2          Review of Systems    Constitutional: Positive for appetite change. Negative for fever and chills.  HENT: Negative for neck stiffness and dental problem.   Eyes: Negative for visual disturbance.  Respiratory: Negative for cough, chest tightness, shortness of breath and wheezing.   Cardiovascular: Negative for chest pain.  Gastrointestinal: Positive for nausea, vomiting and abdominal pain. Negative for diarrhea, constipation, blood in stool, abdominal distention, anal bleeding and rectal pain.  Genitourinary: Negative for dysuria, urgency, hematuria and flank pain.  Musculoskeletal: Negative for myalgias and arthralgias.  Skin: Negative for rash.  Neurological: Negative for dizziness, syncope, speech difficulty, numbness and headaches.  Hematological: Does not bruise/bleed easily.  All other systems reviewed and are negative.    Allergies  Celecoxib; Ketorolac tromethamine; Meperidine hcl; Fentanyl; Ibuprofen; Morphine; Propoxacet-n; and Tramadol hcl  Home Medications   Current Outpatient Rx  Name Route Sig Dispense Refill  . DIPHENHYDRAMINE HCL 12.5 MG/5ML PO LIQD Oral Take 25 mg by mouth 4 (four) times daily as needed. For itching.    Marland Kitchen ESTRADIOL 1 MG PO TABS Oral Take 2 mg by mouth daily.    Marland Kitchen HYDROCODONE-ACETAMINOPHEN 5-500 MG PO TABS Oral Take 1 tablet by mouth every 6 (six) hours as needed. For pain    . HYDROMORPHONE HCL 2 MG PO TABS Oral Take 2 mg by mouth every 4 (four) hours as needed. For pain    . METOCLOPRAMIDE HCL 10  MG PO TABS Oral Take 10 mg by mouth every 6 (six) hours as needed. For nausea      BP 122/75  Pulse 73  Temp(Src) 98.3 F (36.8 C) (Oral)  Resp 20  Ht 5\' 5"  (1.651 m)  Wt 185 lb (83.915 kg)  BMI 30.79 kg/m2  SpO2 97%  LMP 06/01/2011  Physical Exam  Nursing note and vitals reviewed. Constitutional: Vital signs are normal. She appears well-developed and well-nourished. No distress.  HENT:  Head: Normocephalic and atraumatic.  Mouth/Throat: Uvula is midline, oropharynx  is clear and moist and mucous membranes are normal.  Eyes: Conjunctivae and EOM are normal. Pupils are equal, round, and reactive to light.  Neck: Normal range of motion and full passive range of motion without pain. Neck supple. No spinous process tenderness and no muscular tenderness present. No rigidity. No Brudzinski's sign noted.  Cardiovascular: Normal rate and regular rhythm.   Pulmonary/Chest: Effort normal and breath sounds normal. No accessory muscle usage. Not tachypneic. No respiratory distress.  Abdominal: Soft. Normal appearance. She exhibits no distension, no ascites, no pulsatile midline mass and no mass. There is tenderness in the right upper quadrant. There is no CVA tenderness. No hernia.    Lymphadenopathy:    She has no cervical adenopathy.  Neurological: She is alert.  Skin: Skin is warm and dry. No rash noted. She is not diaphoretic.  Psychiatric: She has a normal mood and affect. Her speech is normal and behavior is normal.    ED Course  Procedures (including critical care time)  Labs Reviewed  CBC - Abnormal; Notable for the following:    Hemoglobin 11.9 (*)    HCT 35.7 (*)    All other components within normal limits  DIFFERENTIAL - Abnormal; Notable for the following:    Neutrophils Relative 33 (*)    Neutro Abs 1.4 (*)    Lymphocytes Relative 54 (*)    All other components within normal limits  LIPASE, BLOOD - Abnormal; Notable for the following:    Lipase 95 (*)    All other components within normal limits  BASIC METABOLIC PANEL - Abnormal; Notable for the following:    CO2 17 (*)    All other components within normal limits   No results found.   No diagnosis found.    MDM  Chronic abdominal pain  Patient's labs do not indicate an acute process to cause abdominal pain.  Discussed that we do not treat chronic abdominal pain in the emergency department.  Patient treated with Zofran and advised again to followup with GI.         Jaci Carrel, [place] 10/05/11 1600

## 2011-10-05 NOTE — ED Notes (Signed)
Patient complaining of abdominal pain for 8 weeks. Also states that she has had nausea, vomiting, diarrhea as well. States she has prescription for 2 mg of dilaudid. Was seen at [facility] earlier today for same complaint.

## 2011-10-05 NOTE — ED Notes (Signed)
Seen at [facility] last week, states dr put in wrong "test orders", that they didn't check liver enzymes or pancreas. Continues to have nausea, throwing up, unable to keep anything down.

## 2011-10-06 ENCOUNTER — Emergency Department: Payer: Self-pay | Admitting: *Deleted

## 2011-10-06 MED ORDER — SODIUM CHLORIDE 0.9 % IV BOLUS (SEPSIS)
1000.0000 mL | Freq: Once | INTRAVENOUS | Status: AC
  Administered 2011-10-06: 1000 mL via INTRAVENOUS

## 2011-10-06 MED ORDER — ONDANSETRON HCL 4 MG/2ML IJ SOLN
4.0000 mg | Freq: Once | INTRAMUSCULAR | Status: AC
  Administered 2011-10-06: 4 mg via INTRAVENOUS
  Filled 2011-10-06: qty 2

## 2011-10-06 MED ORDER — DIPHENHYDRAMINE HCL 50 MG/ML IJ SOLN
25.0000 mg | Freq: Once | INTRAMUSCULAR | Status: AC
  Administered 2011-10-06: 25 mg via INTRAVENOUS
  Filled 2011-10-06: qty 1

## 2011-10-06 NOTE — ED Notes (Signed)
Patient appears to be in no acute distress. Talking and laughing with family members and friends, listening to music and singing along with the music from her ipod.

## 2011-10-06 NOTE — ED Provider Notes (Signed)
History     CSN: [id number]  Arrival date & time 10/05/11  2320   First MD Initiated Contact with Patient 10/05/11 2337      Chief Complaint  Patient presents with  . Abdominal Pain    (Consider location/radiation/quality/duration/timing/severity/associated sxs/prior treatment) Patient is a 31 y.o. female presenting with abdominal pain. The history is provided by the patient.  Abdominal Pain The primary symptoms of the illness include abdominal pain, nausea and vomiting. The primary symptoms of the illness do not include fever, fatigue, shortness of breath, diarrhea, hematemesis, hematochezia or dysuria. Episode onset: patient's abdominal pain is chronic patient has been seen 19 times already this year for the exact same pain. The problem has not changed since onset. Symptoms associated with the illness do not include back pain.   Patient describes the abdominal pain is generalized. To be in no acute distress patient just was discharged [facility] emergency department for the same thing with labs done this afternoon. Patient states the pain is a 10 out of 10 she appears to be in no distress patient says associated with nausea and vomiting no vomiting in the emergency department.  Pain described as sharp nonradiating previous ED visit records reviewed. Past Medical History  Diagnosis Date  . Pancreatitis   . S/P laparoscopic cholecystectomy   . Fibroids   . Ovarian cyst   . Chronic abdominal pain   . Anemia     Past Surgical History  Procedure Date  . Cholecystectomy   . Abdominal hysterectomy   . Dilation and curettage of uterus     Family History  Problem Relation Age of Onset  . Diabetes Mother   . Hypertension Mother   . Hypertension Father   . Diabetes Father   . Asthma Daughter     History  Substance Use Topics  . Smoking status: Current Everyday Smoker -- 0.5 packs/day for 10 years    Types: Cigarettes  . Smokeless tobacco: Never Used   Comment: declined    . Alcohol Use: No    OB History    Grav Para Term Preterm Abortions TAB SAB Ect Mult Living   4 2 1 1 2  2          Review of Systems  Constitutional: Negative for fever and fatigue.  HENT: Negative for congestion.   Eyes: Negative for redness.  Respiratory: Negative for cough and shortness of breath.   Cardiovascular: Negative for chest pain.  Gastrointestinal: Positive for nausea, vomiting and abdominal pain. Negative for diarrhea, hematochezia and hematemesis.  Genitourinary: Negative for dysuria.  Musculoskeletal: Negative for back pain.  Neurological: Negative for headaches.  Hematological: Does not bruise/bleed easily.    Allergies  Celecoxib; Ketorolac tromethamine; Meperidine hcl; Fentanyl; Ibuprofen; Morphine; Propoxacet-n; and Tramadol hcl  Home Medications   Current Outpatient Rx  Name Route Sig Dispense Refill  . DIPHENHYDRAMINE HCL 12.5 MG/5ML PO LIQD Oral Take 25 mg by mouth 4 (four) times daily as needed. For itching.    Marland Kitchen ESTRADIOL 1 MG PO TABS Oral Take 2 mg by mouth daily.    Marland Kitchen HYDROCODONE-ACETAMINOPHEN 5-500 MG PO TABS Oral Take 1 tablet by mouth every 6 (six) hours as needed. For pain    . HYDROMORPHONE HCL 2 MG PO TABS Oral Take 2 mg by mouth every 4 (four) hours as needed. For pain    . METOCLOPRAMIDE HCL 10 MG PO TABS Oral Take 10 mg by mouth every 6 (six) hours as needed. For nausea  BP 124/75  Pulse 78  Temp(Src) 98.4 F (36.9 C) (Oral)  Resp 14  Ht 5\' 5"  (1.651 m)  Wt 165 lb (74.844 kg)  BMI 27.46 kg/m2  SpO2 98%  LMP 06/01/2011  Physical Exam  Nursing note and vitals reviewed. Constitutional: She is oriented to person, place, and time. She appears well-developed and well-nourished. No distress.  HENT:  Head: Normocephalic and atraumatic.  Mouth/Throat: Oropharynx is clear and moist.  Eyes: Conjunctivae and EOM are normal. Pupils are equal, round, and reactive to light.  Neck: Normal range of motion. Neck supple.  Cardiovascular:  Normal rate, regular rhythm and normal heart sounds.   No murmur heard. Pulmonary/Chest: Effort normal and breath sounds normal. No respiratory distress.  Abdominal: Soft. Bowel sounds are normal. There is no tenderness.  Musculoskeletal: Normal range of motion. She exhibits no edema.  Neurological: She is alert and oriented to person, place, and time. No cranial nerve deficit. She exhibits normal muscle tone. Coordination normal.  Skin: Skin is warm. No rash noted. She is not diaphoretic. No erythema.    ED Course  Procedures (including critical care time)  Labs Reviewed - No data to display No results found.   1. Chronic abdominal pain       MDM   Patient has been seen 19 times this year for the same complaint visit here today is the 20th. Patient was just seen and discharged from [facility] emergency department this afternoon for the same complaint they did labs which showed no significant abnormalities other than a CO2 of 17 and mild elevation of lipase. Those labs essentially unchanged labs that were done from a visit on April 1. Patient known to have chronic abdominal pain chronic pancreatitis. Here in the emergency apartment patient in no distress no significant pain she was requesting pain medicine as she usually does from review of the other charts. Treated patient with IV Zofran and Benadryl for some itching that she had in 1 L of IV normal saline however patient left AMA prior to the fluids going in. Patient was informed that she would not be given any pain medicine. Patient's had multiple referrals to other providers patient needs to follow up with GI medicine for the chronic pancreatitis chronic abdominal pain.        Shelda Jakes, MD 10/06/11 [phone]

## 2011-10-06 NOTE — ED Notes (Signed)
Patient states she wants to leave. Asked edp and he stated he wanted patient to finish fluid before leaving, and that patient could sign out ama if she wants to leave without finishing fluids. Patient states she wants to go home now and will sign out AMA.

## 2011-10-07 LAB — LIPASE, BLOOD: Lipase: 342 U/L (ref 73–393)

## 2011-10-07 LAB — COMPREHENSIVE METABOLIC PANEL
Albumin: 3.8 g/dL (ref 3.4–5.0)
Alkaline Phosphatase: 110 U/L (ref 50–136)
Bilirubin,Total: 0.2 mg/dL (ref 0.2–1.0)
Creatinine: 0.76 mg/dL (ref 0.60–1.30)
EGFR (African American): >60
EGFR (Non-African Amer.): >60
Potassium: 3.8 mmol/L (ref 3.5–5.1)
SGPT (ALT): 34 U/L

## 2011-10-07 LAB — CBC
MCH: 28.2 pg (ref 26.0–34.0)
MCHC: 34.1 g/dL (ref 32.0–36.0)
MCV: 83 fL (ref 80–100)
RBC: 4.49 10*6/uL (ref 3.80–5.20)
WBC: 5.8 10*3/uL (ref 3.6–11.0)

## 2011-10-10 NOTE — ED Provider Notes (Signed)
Evaluation and management procedures were performed by the PA/NP/Resident Physician under my supervision/collaboration.   Jiali Linney D Ellieanna Funderburg, MD 10/10/11 1545 

## 2011-10-13 ENCOUNTER — Emergency Department: Payer: Self-pay | Admitting: Emergency Medicine

## 2011-10-14 ENCOUNTER — Emergency Department (HOSPITAL_COMMUNITY)
Admission: EM | Admit: 2011-10-14 | Discharge: 2011-10-14 | Disposition: A | Discharge status: Home / Self Care | Payer: Medicaid Other | Attending: Emergency Medicine | Admitting: Emergency Medicine

## 2011-10-14 DIAGNOSIS — R1013 Epigastric pain: Secondary | ICD-10-CM | POA: Insufficient documentation

## 2011-10-14 DIAGNOSIS — F17200 Nicotine dependence, unspecified, uncomplicated: Secondary | ICD-10-CM | POA: Insufficient documentation

## 2011-10-14 DIAGNOSIS — D649 Anemia, unspecified: Secondary | ICD-10-CM | POA: Insufficient documentation

## 2011-10-14 DIAGNOSIS — R109 Unspecified abdominal pain: Secondary | ICD-10-CM

## 2011-10-14 LAB — URINALYSIS, COMPLETE
Nitrite: NEGATIVE
Ph: 5 (ref 4.5–8.0)
Protein: NEGATIVE
RBC,UR: 3 /HPF (ref 0–5)
Specific Gravity: 1.032 (ref 1.003–1.030)
Squamous Epithelial: 12
WBC UR: 2 /HPF (ref 0–5)

## 2011-10-14 LAB — CBC
MCHC: 33.4 g/dL (ref 32.0–36.0)
MCV: 82 fL (ref 80–100)
RDW: 12.7 % (ref 11.5–14.5)

## 2011-10-14 LAB — COMPREHENSIVE METABOLIC PANEL
Albumin: 4.1 g/dL (ref 3.4–5.0)
Alkaline Phosphatase: 99 U/L (ref 50–136)
Anion Gap: 8 (ref 7–16)
BUN: 17 mg/dL (ref 7–18)
Bilirubin,Total: 0.2 mg/dL (ref 0.2–1.0)
Chloride: 107 mmol/L (ref 98–107)
Co2: 23 mmol/L (ref 21–32)
EGFR (African American): >60
EGFR (Non-African Amer.): >60
Glucose: 87 mg/dL (ref 65–99)
Osmolality: 277 (ref 275–301)
Potassium: 4.1 mmol/L (ref 3.5–5.1)
SGOT(AST): 26 U/L (ref 15–37)
SGPT (ALT): 32 U/L
Sodium: 138 mmol/L (ref 136–145)
Total Protein: 8.8 g/dL — ABNORMAL HIGH (ref 6.4–8.2)

## 2011-10-14 LAB — LIPASE, BLOOD: Lipase: 833 U/L — ABNORMAL HIGH (ref 73–393)

## 2011-10-14 LAB — AMYLASE: Amylase: 150 U/L — ABNORMAL HIGH (ref 25–115)

## 2011-10-14 NOTE — ED Provider Notes (Signed)
History     CSN: [id number]  Arrival date & time 10/14/11  0816   First MD Initiated Contact with Patient 10/14/11 [phone]      No chief complaint on file.   (Consider location/radiation/quality/duration/timing/severity/associated sxs/prior treatment) HPI Pt present for the same abdominal pain she has been seen numerous times before. There is no change to the pain. She has had several episodes of vomiting and diarrhea. States she went to [facility] last night and she was told she needed to be admitted for pancreatitis. She refused admission and left to come to this ED. She is playing on her cell phone in her room in no distress whatsoever.  Past Medical History  Diagnosis Date  . Pancreatitis   . S/P laparoscopic cholecystectomy   . Fibroids   . Ovarian cyst   . Chronic abdominal pain   . Anemia     Past Surgical History  Procedure Date  . Cholecystectomy   . Abdominal hysterectomy   . Dilation and curettage of uterus     Family History  Problem Relation Age of Onset  . Diabetes Mother   . Hypertension Mother   . Hypertension Father   . Diabetes Father   . Asthma Daughter     History  Substance Use Topics  . Smoking status: Current Everyday Smoker -- 0.5 packs/day for 10 years    Types: Cigarettes  . Smokeless tobacco: Never Used   Comment: declined  . Alcohol Use: No    OB History    Grav Para Term Preterm Abortions TAB SAB Ect Mult Living   4 2 1 1 2  2          Review of Systems  Constitutional: Negative for fever and chills.  Respiratory: Negative for shortness of breath.   Cardiovascular: Negative for chest pain.  Gastrointestinal: Positive for nausea, vomiting, abdominal pain and diarrhea. Negative for blood in stool.  Musculoskeletal: Negative for back pain.    Allergies  Celecoxib; Ketorolac tromethamine; Meperidine hcl; Fentanyl; Ibuprofen; Morphine; Propoxacet-n; and Tramadol hcl  Home Medications   Current Outpatient Rx  Name Route Sig Dispense  Refill  . DIPHENHYDRAMINE HCL 12.5 MG/5ML PO LIQD Oral Take 25 mg by mouth 4 (four) times daily as needed. For itching.    Marland Kitchen ESTRADIOL 1 MG PO TABS Oral Take 2 mg by mouth daily.    Marland Kitchen HYDROCODONE-ACETAMINOPHEN 5-500 MG PO TABS Oral Take 1 tablet by mouth every 6 (six) hours as needed. For pain    . HYDROMORPHONE HCL 2 MG PO TABS Oral Take 2 mg by mouth every 4 (four) hours as needed. For pain    . METOCLOPRAMIDE HCL 10 MG PO TABS Oral Take 10 mg by mouth every 6 (six) hours as needed. For nausea      BP 130/80  Pulse 88  Temp(Src) 98.1 F (36.7 C) (Oral)  Resp 16  Wt 160 lb (72.576 kg)  SpO2 99%  LMP 06/01/2011  Physical Exam  Nursing note and vitals reviewed. Constitutional: She is oriented to person, place, and time. She appears well-developed and well-nourished. No distress.       Playing on cell phone. Comfortable.   HENT:  Head: Normocephalic and atraumatic.  Mouth/Throat: Oropharynx is clear and moist.  Eyes: EOM are normal. Pupils are equal, round, and reactive to light.  Neck: Normal range of motion. Neck supple.  Cardiovascular: Normal rate and regular rhythm.   Pulmonary/Chest: Effort normal and breath sounds normal. No respiratory distress. She has no  wheezes. She has no rales.  Abdominal: Soft. Bowel sounds are normal. She exhibits no mass. There is tenderness (Very mild epigastric tenderness. No masses, rebound, guarding). There is no rebound and no guarding.       Benign abdomen  Musculoskeletal: Normal range of motion. She exhibits no edema and no tenderness.  Neurological: She is alert and oriented to person, place, and time.  Skin: Skin is warm and dry. No rash noted. No erythema.  Psychiatric: She has a normal mood and affect. Her behavior is normal.    ED Course  Procedures (including critical care time)  Labs Reviewed - No data to display No results found.   No diagnosis found.    MDM  Pt states she does not want to be admitted she just wants to be  given IV pain medication. Based on her exam (normal HR, benign abdomen) she is in no acute distress and does not warrant IV pain medications. I have counseled her that she needs to be seen by chronic pain MD for management of her chronic medical condition. She became upset and making threats to file lawsuits. Pt left AMA.          Loren Racer, MD 10/14/11 [phone]

## 2011-10-15 ENCOUNTER — Encounter (HOSPITAL_COMMUNITY): Payer: Self-pay | Admitting: *Deleted

## 2011-10-15 ENCOUNTER — Emergency Department (HOSPITAL_COMMUNITY)
Admission: EM | Admit: 2011-10-15 | Discharge: 2011-10-16 | Disposition: A | Discharge status: Home / Self Care | Payer: Medicaid Other | Attending: Emergency Medicine | Admitting: Emergency Medicine

## 2011-10-15 DIAGNOSIS — R109 Unspecified abdominal pain: Secondary | ICD-10-CM | POA: Insufficient documentation

## 2011-10-15 DIAGNOSIS — K861 Other chronic pancreatitis: Secondary | ICD-10-CM

## 2011-10-15 DIAGNOSIS — K859 Acute pancreatitis without necrosis or infection, unspecified: Secondary | ICD-10-CM | POA: Insufficient documentation

## 2011-10-15 DIAGNOSIS — F17200 Nicotine dependence, unspecified, uncomplicated: Secondary | ICD-10-CM | POA: Insufficient documentation

## 2011-10-15 NOTE — ED Notes (Signed)
abd pain for 4 weeks with nv ,  She was seen  At [place] ed 2 days ago. For the same she has her lab work with her. She needs pain med

## 2011-10-16 LAB — COMPREHENSIVE METABOLIC PANEL WITH GFR
ALT: 164 U/L — ABNORMAL HIGH (ref 0–35)
AST: 72 U/L — ABNORMAL HIGH (ref 0–37)
Albumin: 4 g/dL (ref 3.5–5.2)
Alkaline Phosphatase: 120 U/L — ABNORMAL HIGH (ref 39–117)
BUN: 16 mg/dL (ref 6–23)
CO2: 25 meq/L (ref 19–32)
Calcium: 9.3 mg/dL (ref 8.4–10.5)
Chloride: 104 meq/L (ref 96–112)
Creatinine, Ser: 0.64 mg/dL (ref 0.50–1.10)
GFR calc Af Amer: >90 mL/min
GFR calc non Af Amer: >90 mL/min
Glucose, Bld: 88 mg/dL (ref 70–99)
Potassium: 3.6 meq/L (ref 3.5–5.1)
Sodium: 140 meq/L (ref 135–145)
Total Bilirubin: 0.3 mg/dL (ref 0.3–1.2)
Total Protein: 8 g/dL (ref 6.0–8.3)

## 2011-10-16 LAB — LIPASE, BLOOD: Lipase: 200 U/L — ABNORMAL HIGH (ref 11–59)

## 2011-10-16 MED ORDER — HYDROMORPHONE HCL PF 1 MG/ML IJ SOLN
1.0000 mg | Freq: Once | INTRAMUSCULAR | Status: AC
  Administered 2011-10-16: 1 mg via INTRAMUSCULAR
  Filled 2011-10-16: qty 1

## 2011-10-16 MED ORDER — METOCLOPRAMIDE HCL 5 MG/ML IJ SOLN
10.0000 mg | Freq: Once | INTRAMUSCULAR | Status: AC
  Administered 2011-10-16: 10 mg via INTRAMUSCULAR
  Filled 2011-10-16: qty 2

## 2011-10-16 MED ORDER — DIPHENHYDRAMINE HCL 50 MG/ML IJ SOLN
25.0000 mg | Freq: Once | INTRAMUSCULAR | Status: AC
  Administered 2011-10-16: 25 mg via INTRAMUSCULAR
  Filled 2011-10-16: qty 1

## 2011-10-16 NOTE — ED Notes (Signed)
Pt requesting IV pain medication and "itching" medicine - plan of care discussed w/ Dr. Norlene Campbell and pt, pt to have lab work and then need for IV pain medication will be re-evaluated. Pt states she does not "want to get stuck twice" and wishes to have an IV started to obtain lab work. Attempted IV x1 - phlebotomy at bedside to attempt blood draw.

## 2011-10-16 NOTE — ED Notes (Signed)
Pt resting in bed with no acute distress. Minium eye contact made, continuing to play game on cell phone. Requesting pain RX and lab results. Pt informed no lab results resulted at current time. Pt informed of need of urine sample, pt stated "I feel dehydrated and I ain't got to go now and I'll let you know". Pt informed on the importance of urine sample and it will speed up ED visit. Pt currently requesting charge nurse and new MD. Family member sleeping at bedside with infant in carrier sleeping. Dr Norlene Campbell and charge RN notified.

## 2011-10-16 NOTE — ED Notes (Signed)
Pt presents w/ chronic abd pain, non-tender to palpation - pt was recently seen at [facility] and was told her lipase was elevated and refused admission there - pt then proceeded to [facility] at which point pt left AMA. Pt also c/o constant n/v and unable to tolerate PO intake. Pt lying on bed in no acute distress - playing on her cell phone. Family x2 at bedside including an infant.

## 2011-10-16 NOTE — ED Provider Notes (Addendum)
History     CSN: [id number]  Arrival date & time 10/15/11  2333   First MD Initiated Contact with Patient 10/16/11 0201      Chief Complaint  Patient presents with  . Abdominal Pain    (Consider location/radiation/quality/duration/timing/severity/associated sxs/prior treatment) HPI 31 year old female presents to emergency department complaining of abdominal pain. Patient reports she was seen at [facility] on the 11th and told she had acute on chronic pancreatitis. They recommended admission to their facility but she left AMA. Patient was seen at [facility] on Thursday as well, but left AMA. Patient with multiple visits to many facilities for chronic abdominal pain. Patient reports she has appointment with chronic pain management Dr. in 2 weeks. Patient is requesting admission and treatment of her pain. She reports she's been unable to keep down any of her medications secondary to vomiting. Patient describes pain in epigastrium no radiation. Of note, patient is texting on her cell phone and laughing with people around the room and does not appear to be acutely ill Past Medical History  Diagnosis Date  . Pancreatitis   . S/P laparoscopic cholecystectomy   . Fibroids   . Ovarian cyst   . Chronic abdominal pain   . Anemia     Past Surgical History  Procedure Date  . Cholecystectomy   . Abdominal hysterectomy   . Dilation and curettage of uterus     Family History  Problem Relation Age of Onset  . Diabetes Mother   . Hypertension Mother   . Hypertension Father   . Diabetes Father   . Asthma Daughter     History  Substance Use Topics  . Smoking status: Current Everyday Smoker -- 0.5 packs/day for 10 years    Types: Cigarettes  . Smokeless tobacco: Never Used   Comment: declined  . Alcohol Use: No    OB History    Grav Para Term Preterm Abortions TAB SAB Ect Mult Living   4 2 1 1 2  2          Review of Systems  All other systems reviewed and are  negative.    Allergies  Celecoxib; Ketorolac tromethamine; Meperidine hcl; Fentanyl; Ibuprofen; Morphine; Propoxacet-n; and Tramadol hcl  Home Medications   Current Outpatient Rx  Name Route Sig Dispense Refill  . DIPHENHYDRAMINE HCL 12.5 MG/5ML PO LIQD Oral Take 50 mg by mouth 4 (four) times daily as needed. For itching.    Marland Kitchen ESTRADIOL 1 MG PO TABS Oral Take 2 mg by mouth daily.    Marland Kitchen HYDROMORPHONE HCL 2 MG PO TABS Oral Take 2 mg by mouth every 4 (four) hours as needed. For pain    . ONDANSETRON HCL 8 MG PO TABS Oral Take 8 mg by mouth every 8 (eight) hours as needed. For nausea    . METOCLOPRAMIDE HCL 10 MG PO TABS Oral Take 10 mg by mouth every 6 (six) hours as needed. For nausea      BP 114/83  Pulse 81  Temp(Src) 98.3 F (36.8 C) (Oral)  Resp 18  SpO2 98%  LMP 06/01/2011  Physical Exam  Nursing note and vitals reviewed. Constitutional: She is oriented to person, place, and time. She appears well-developed and well-nourished.  HENT:  Head: Normocephalic and atraumatic.  Nose: Nose normal.  Mouth/Throat: Oropharynx is clear and moist.  Eyes: Conjunctivae and EOM are normal. Pupils are equal, round, and reactive to light.  Neck: Normal range of motion. Neck supple. No JVD present. No tracheal  deviation present. No thyromegaly present.  Cardiovascular: Normal rate, regular rhythm, normal heart sounds and intact distal pulses.  Exam reveals no gallop and no friction rub.   No murmur heard. Pulmonary/Chest: Effort normal and breath sounds normal. No stridor. No respiratory distress. She has no wheezes. She has no rales. She exhibits no tenderness.  Abdominal: Soft. Bowel sounds are normal. She exhibits no distension and no mass. There is tenderness (very mild tenderness to upper abdomen, not appear to be acute abdomen no guarding no rigidity no rebound). There is no rebound and no guarding.  Musculoskeletal: Normal range of motion. She exhibits no edema and no tenderness.   Lymphadenopathy:    She has no cervical adenopathy.  Neurological: She is oriented to person, place, and time. She exhibits normal muscle tone. Coordination normal.  Skin: Skin is dry. No rash noted. No erythema. No pallor.  Psychiatric: She has a normal mood and affect. Her behavior is normal. Judgment and thought content normal.    ED Course  Procedures (including critical care time)  Labs Reviewed  COMPREHENSIVE METABOLIC PANEL - Abnormal; Notable for the following:    AST 72 (*)    ALT 164 (*)    Alkaline Phosphatase 120 (*)    All other components within normal limits  LIPASE, BLOOD - Abnormal; Notable for the following:    Lipase 200 (*)    All other components within normal limits  URINALYSIS, DIPSTICK ONLY  URINE RAPID DRUG SCREEN (HOSP PERFORMED)   No results found.   1. Acute pancreatitis       MDM  31 year old with chronic abdominal pain. Will recheck labs given recent abnormal value at [facility]. Will hold on treatment at this time. Patient appears comfortable is not actively vomiting.        Olivia Mackie, MD 10/16/11 0309  4:30 AM Patient with mild elevation in LFTs and lipase. Much improved from the values that she brings from [place]. Will give patient one time dose of pain nausea and itching medicine IM. [facility]. BUN and creatinine are not consistent with persistent vomiting and dehydration  Olivia Mackie, MD 10/16/11 1610  Olivia Mackie, MD 10/16/11 [phone]

## 2011-10-16 NOTE — Discharge Instructions (Signed)
Stick to a liquid diet until you're feeling better. Take your medications as prescribed. Please see your new provider care doctor as scheduled  Acute Pancreatitis Acute pancreatitis is the sudden irritation (inflammation) of the pancreas. The pancreas produces digestive juices or enzymes that break down food. The pancreas also makes 2 hormones that control your blood sugar. If the pancreas is irritated, the enzymes attack internal structures and tissues become damaged. When this happens, the pancreas fails to make new enzymes that break down food. HOME CARE  Eat small meals often. Avoid fatty, greasy, and fried foods.   Follow diet instructions given to you by your doctor.   Avoid foods or drinks that may have started the problem (like alcohol).   Drink enough fluids to keep your pee (urine) clear or pale yellow.   Only take medicine as told by your doctor.   Rest often.   Check your blood sugar at home if you are told to.   Keep all your follow-up visits. This is very important.  GET HELP RIGHT AWAY IF:   You do not get better in the time your doctor said you would.   You have pain, weakness, or feel sick to your stomach (nauseous).   You recover but then have another episode of pain.   You are unable to eat or keep liquids down.   Your pain gets worse or changes.   You have a temperature by mouth above 102 F (38.9 C), not controlled by medicine.   Your skin or the white part of your eyes look yellow.   You start to throw up (vomit).   You feel dizzy or pass out (faint).   Your blood sugar is high (over 300).  MAKE SURE YOU:   Understand these instructions.   Will watch your condition.   Will get help right away if you are not doing well or get worse.  Document Released: 12/08/2007 Document Revised: 06/10/2011 Document Reviewed: 12/08/2007 [facility] Patient Information 2012 [place], [place].Acute Pancreatitis Acute pancreatitis is the sudden irritation  (inflammation) of the pancreas. The pancreas produces digestive juices or enzymes that break down food. The pancreas also makes 2 hormones that control your blood sugar. If the pancreas is irritated, the enzymes attack internal structures and tissues become damaged. When this happens, the pancreas fails to make new enzymes that break down food. HOME CARE  Eat small meals often. Avoid fatty, greasy, and fried foods.   Follow diet instructions given to you by your doctor.   Avoid foods or drinks that may have started the problem (like alcohol).   Drink enough fluids to keep your pee (urine) clear or pale yellow.   Only take medicine as told by your doctor.   Rest often.   Check your blood sugar at home if you are told to.   Keep all your follow-up visits. This is very important.  GET HELP RIGHT AWAY IF:   You do not get better in the time your doctor said you would.   You have pain, weakness, or feel sick to your stomach (nauseous).   You recover but then have another episode of pain.   You are unable to eat or keep liquids down.   Your pain gets worse or changes.   You have a temperature by mouth above 102 F (38.9 C), not controlled by medicine.   Your skin or the white part of your eyes look yellow.   You start to throw up (vomit).   You  feel dizzy or pass out (faint).   Your blood sugar is high (over 300).  MAKE SURE YOU:   Understand these instructions.   Will watch your condition.   Will get help right away if you are not doing well or get worse.  Document Released: 12/08/2007 Document Revised: 06/10/2011 Document Reviewed: 12/08/2007 [facility] Patient Information 2012 [place], [place].

## 2011-10-24 ENCOUNTER — Emergency Department (HOSPITAL_COMMUNITY)
Admission: EM | Admit: 2011-10-24 | Discharge: 2011-10-24 | Discharge status: Against Medical Advice | Payer: Medicaid Other | Attending: Emergency Medicine | Admitting: Emergency Medicine

## 2011-10-24 ENCOUNTER — Encounter (HOSPITAL_COMMUNITY): Payer: Self-pay | Admitting: Emergency Medicine

## 2011-10-24 DIAGNOSIS — R1012 Left upper quadrant pain: Secondary | ICD-10-CM | POA: Insufficient documentation

## 2011-10-24 DIAGNOSIS — R1110 Vomiting, unspecified: Secondary | ICD-10-CM | POA: Insufficient documentation

## 2011-10-24 DIAGNOSIS — R197 Diarrhea, unspecified: Secondary | ICD-10-CM | POA: Insufficient documentation

## 2011-10-24 NOTE — ED Notes (Signed)
Pt walked out of triage entrance.  Clydie Braun, EMT went and told her that she was next to go back and that there were several discharges in the back.  States she has to go pick someone up and will be back.

## 2011-10-24 NOTE — ED Notes (Signed)
LUQ pain with nausea, vomiting, and diarrhea x 4 days.

## 2011-10-25 ENCOUNTER — Emergency Department (HOSPITAL_COMMUNITY)
Admission: EM | Admit: 2011-10-25 | Discharge: 2011-10-25 | Disposition: A | Discharge status: Home / Self Care | Payer: Medicaid Other | Attending: Emergency Medicine | Admitting: Emergency Medicine

## 2011-10-25 ENCOUNTER — Encounter (HOSPITAL_COMMUNITY): Payer: Self-pay | Admitting: *Deleted

## 2011-10-25 DIAGNOSIS — F17200 Nicotine dependence, unspecified, uncomplicated: Secondary | ICD-10-CM | POA: Insufficient documentation

## 2011-10-25 DIAGNOSIS — G8929 Other chronic pain: Secondary | ICD-10-CM | POA: Insufficient documentation

## 2011-10-25 DIAGNOSIS — Z765 Malingerer [conscious simulation]: Secondary | ICD-10-CM

## 2011-10-25 DIAGNOSIS — R112 Nausea with vomiting, unspecified: Secondary | ICD-10-CM | POA: Insufficient documentation

## 2011-10-25 DIAGNOSIS — R1013 Epigastric pain: Secondary | ICD-10-CM | POA: Insufficient documentation

## 2011-10-25 LAB — URINALYSIS, ROUTINE W REFLEX MICROSCOPIC
Bilirubin Urine: NEGATIVE
Glucose, UA: NEGATIVE mg/dL
Hgb urine dipstick: NEGATIVE
Ketones, ur: NEGATIVE mg/dL
Nitrite: NEGATIVE
Protein, ur: NEGATIVE mg/dL
Specific Gravity, Urine: 1.033 — ABNORMAL HIGH (ref 1.005–1.030)
Urobilinogen, UA: 1 mg/dL (ref 0.0–1.0)
pH: 6 (ref 5.0–8.0)

## 2011-10-25 LAB — URINE MICROSCOPIC-ADD ON

## 2011-10-25 LAB — CBC
HCT: 37.1 % (ref 36.0–46.0)
Hemoglobin: 12.6 g/dL (ref 12.0–15.0)
MCH: 27.5 pg (ref 26.0–34.0)
MCHC: 34 g/dL (ref 30.0–36.0)
MCV: 80.8 fL (ref 78.0–100.0)
Platelets: 269 10*3/uL (ref 150–400)
RBC: 4.59 MIL/uL (ref 3.87–5.11)
RDW: 12.3 % (ref 11.5–15.5)
WBC: 5.2 10*3/uL (ref 4.0–10.5)

## 2011-10-25 LAB — LIPASE, BLOOD: Lipase: 104 U/L — ABNORMAL HIGH (ref 11–59)

## 2011-10-25 LAB — COMPREHENSIVE METABOLIC PANEL
ALT: 18 U/L (ref 0–35)
AST: 18 U/L (ref 0–37)
Albumin: 3.6 g/dL (ref 3.5–5.2)
Alkaline Phosphatase: 69 U/L (ref 39–117)
BUN: 14 mg/dL (ref 6–23)
CO2: 24 mEq/L (ref 19–32)
Calcium: 8.9 mg/dL (ref 8.4–10.5)
Chloride: 106 mEq/L (ref 96–112)
Creatinine, Ser: 0.55 mg/dL (ref 0.50–1.10)
GFR calc Af Amer: >90 mL/min (ref >90)
GFR calc non Af Amer: >90 mL/min (ref >90)
Glucose, Bld: 79 mg/dL (ref 70–99)
Potassium: 4 mEq/L (ref 3.5–5.1)
Sodium: 139 mEq/L (ref 135–145)
Total Bilirubin: 0.2 mg/dL — ABNORMAL LOW (ref 0.3–1.2)
Total Protein: 7.4 g/dL (ref 6.0–8.3)

## 2011-10-25 LAB — PREGNANCY, URINE: Preg Test, Ur: NEGATIVE

## 2011-10-25 NOTE — ED Notes (Signed)
To ED for eval of abd pain. States she has pancreatitis. Was in ED last night but states she had to leave to pick up 'kids father' before seeing the md.

## 2011-10-25 NOTE — Discharge Instructions (Signed)
Abdominal Pain Abdominal pain can be caused by many things. Your caregiver decides the seriousness of your pain by an examination and possibly blood tests and X-rays. Many cases can be observed and treated at home. Most abdominal pain is not caused by a disease and will probably improve without treatment. However, in many cases, more time must pass before a clear cause of the pain can be found. Before that point, it may not be known if you need more testing, or if hospitalization or surgery is needed. HOME CARE INSTRUCTIONS   Do not take laxatives unless directed by your caregiver.   Take pain medicine only as directed by your caregiver.   Only take over-the-counter or prescription medicines for pain, discomfort, or fever as directed by your caregiver.   Try a clear liquid diet (broth, tea, or water) for as long as directed by your caregiver. Slowly move to a bland diet as tolerated.  SEEK IMMEDIATE MEDICAL CARE IF:   The pain does not go away.   You have a fever.   You keep throwing up (vomiting).   The pain is felt only in portions of the abdomen. Pain in the right side could possibly be appendicitis. In an adult, pain in the left lower portion of the abdomen could be colitis or diverticulitis.   You pass bloody or black tarry stools.  MAKE SURE YOU:   Understand these instructions.   Will watch your condition.   Will get help right away if you are not doing well or get worse.  Document Released: 03/31/2005 Document Revised: 06/10/2011 Document Reviewed: 02/07/2008 [employer] Patient Information 2012 Exit[employer], LLCChronic Pain Chronic pain can be defined as pain that is lasting, off and on, and lasts for 3 to 6 months or longer. Many things cause chronic pain, which can make it difficult to make a discrete diagnosis. There are many treatment options available for chronic pain. However, finding a treatment that works well for you may require trying various approaches until a suitable  one is found. CAUSES  In some types of chronic medical conditions, the pain is caused by a normal pain response within the body. A normal pain response helps the body identify illness or injury and prevent further damage from being done. In these cases, the cause of the pain may be identified and treated, even if it may not be cured completely. Examples of chronic conditions which can cause chronic pain include:  Inflammation of the joints (arthritis).   Back pain or neck pain (including bulging or herniated disks).   Migraine headaches.   Cancer.  In some other types of chronic pain syndromes, the pain is caused by an abnormal pain response within the body. An abnormal pain response is present when there is no ongoing cause (or stimulus) for the pain, or when the cause of the pain is arising from the nerves or nervous system itself. Examples of conditions which can cause chronic pain due to an abnormal pain response include:  Fibromyalgia.   Reflex sympathetic dystrophy (RSD).   Neuropathy (when the nerves themselves are damaged, and may cause pain).  DIAGNOSIS  Your caregiver will help diagnose your condition over time. In many cases, the initial focus will be on excluding conditions that could be causing the pain. Depending on your symptoms, your caregiver may order some tests to diagnose your condition. Some of these tests include:  Blood tests.   Computerized X-ray scans (CT scan).   Computerized magnetic scans (MRI).   X-rays.  Ultrasounds.   Nerve conduction studies.   Consultation with other physicians or specialists.  TREATMENT  There are many treatment options for people suffering from chronic pain. Finding a treatment that works well may take time.   You may be referred to a pain management specialist.   You may be put on medication to help with the pain. Unfortunately, some medications (such as opiate medications) may not be very effective in cases where chronic  pain is due to abnormal pain responses. Finding the right medications can take some time.   Adjunctive therapies may be used to provide additional relief and improve a patient's quality of life. These therapies include:   Mindfulness meditation.   Acupuncture.   Biofeedback.   Cognitive-behavioral therapy.   In certain cases, surgical interventions may be attempted.  HOME CARE INSTRUCTIONS   Make sure you understand these instructions prior to discharge.   Ask any questions and share any further concerns you have with your caregiver prior to discharge.   Take all medications as directed by your caregiver.   Keep all follow-up appointments.  SEEK MEDICAL CARE IF:   Your pain gets worse.   You develop a new pain that was not present before.   You cannot tolerate any medications prescribed by your caregiver.   You develop new symptoms since your last visit with your caregiver.  SEEK IMMEDIATE MEDICAL CARE IF:   You develop muscular weakness.   You have decreased sensation or numbness.   You lose control of bowel or bladder function.   Your pain suddenly gets much worse.   You have an oral temperature above 102 F (38.9 C), not controlled by medication.   You develop shaking chills, confusion, chest pain, or shortness of breath.  Document Released: 03/13/2002 Document Revised: 06/10/2011 Document Reviewed: 06/19/2008 [facility] Patient Information 2012 [place], [place].Marland Kitchen  RESOURCE GUIDE  Dental Problems  Patients with Medicaid: [facility] [phone] [address]                                           [phone] [address] Phone:  [phone]                                                  Phone:  [phone]  If unable to pay or uninsured, contact:  [facility] or [facility]. to become qualified for the adult dental clinic.  Chronic Pain Problems Contact [facility]   [phone] Patients need to be referred by their primary care doctor.  Insufficient Money for Medicine Contact [employer]:  call "211" or [facility] 415-823-0045.  No Primary Care Doctor Call [facility]  [phone] Other agencies that provide inexpensive medical care    [facility]  [phone]    [facility]  [phone]    [facility]  [phone]    [facility]  [phone]    [facility]  [phone]    [facility]  [phone]  Psychological Services [facility] Behavioral Health  [phone] [facility]  [phone] [facility] Mental  Health   [phone] (emergency services [phone])  Substance Abuse Resources [facility]  [phone] [facility] [phone] [place] [phone] [facility] [phone] Residential & Outpatient Substance Abuse Program  [phone]  Abuse/Neglect [facility] Child Abuse Hotline 613-421-8717 [facility] Child Abuse Hotline (336) 188-2034 (After Hours)  Emergency Shelter [facility] [phone]  M[facility] [phone] [facility] [phone]  MRSA Hotline #:   250-353-2688    [facility] Resources  [facility]     [employer]                          [facility] [address] [place]                       [address]      [address]  Blondell Reveal Phone:  [phone]                                   Phone:  [phone]                 Phone:  [phone]  [facility] Mental Health Phone:  [phone]  [facility] Child Abuse Hotline [phone] [phone] (After Hours)

## 2011-10-25 NOTE — ED Provider Notes (Cosign Needed)
History    30yF with abdominal pain. Chronic in nature but worse than normally is. Epigastric. No radiation. Reports hx of pancreatitis. " I went to  and they told me my enzymes were high." Pt is unsure of the specifics though. No fever or chills. Nausea and multiple episodes of emesis. No urinary complaints. No unusual vaginal bleeding or discharge.  CSN: [id number]  Arrival date & time 10/25/11  1455   First MD Initiated Contact with Patient 10/25/11 1614      Chief Complaint  Patient presents with  . Abdominal Pain    (Consider location/radiation/quality/duration/timing/severity/associated sxs/prior treatment) HPI  Past Medical History  Diagnosis Date  . Pancreatitis   . S/P laparoscopic cholecystectomy   . Fibroids   . Ovarian cyst   . Chronic abdominal pain   . Anemia     Past Surgical History  Procedure Date  . Cholecystectomy   . Abdominal hysterectomy   . Dilation and curettage of uterus     Family History  Problem Relation Age of Onset  . Diabetes Mother   . Hypertension Mother   . Hypertension Father   . Diabetes Father   . Asthma Daughter     History  Substance Use Topics  . Smoking status: Current Everyday Smoker -- 0.5 packs/day for 10 years    Types: Cigarettes  . Smokeless tobacco: Never Used   Comment: declined  . Alcohol Use: No    OB History    Grav Para Term Preterm Abortions TAB SAB Ect Mult Living   4 2 1 1 2  2          Review of Systems   Review of symptoms negative unless otherwise noted in HPI.   Allergies  Celecoxib; Ketorolac tromethamine; Meperidine hcl; Fentanyl; Ibuprofen; Morphine; Propoxacet-n; and Tramadol hcl  Home Medications   Current Outpatient Rx  Name Route Sig Dispense Refill  . DIPHENHYDRAMINE HCL 12.5 MG/5ML PO LIQD Oral Take 50 mg by mouth 4 (four) times daily as needed. For itching.    Marland Kitchen ESTRADIOL 2 MG PO TABS Oral Take 2 mg by mouth daily.    Marland Kitchen HYDROMORPHONE HCL 2 MG PO TABS Oral Take 2 mg by  mouth every 4 (four) hours as needed. For pain    . METOCLOPRAMIDE HCL 10 MG PO TABS Oral Take 10 mg by mouth every 6 (six) hours as needed. For nausea    . ONDANSETRON HCL 8 MG PO TABS Oral Take 8 mg by mouth every 8 (eight) hours as needed. For nausea      BP 115/80  Pulse 70  Temp(Src) 98.1 F (36.7 C) (Oral)  Resp 20  SpO2 100%  LMP 06/01/2011  Physical Exam  Nursing note and vitals reviewed. Constitutional: She appears well-developed and well-nourished. No distress.       Sitting up in bed. NAD. Listening to headphones and using cell phone. 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   HENT:  Head: Normocephalic and atraumatic.  Eyes: Conjunctivae are normal. Right eye exhibits no discharge. Left eye exhibits no discharge.  Neck: Neck supple.  Cardiovascular: Normal rate, regular rhythm and normal heart sounds.  Exam reveals no gallop and no friction rub.   No murmur heard. Pulmonary/Chest: Effort normal and breath sounds normal. No respiratory distress.  Abdominal: Soft. She exhibits no distension and no mass. There is tenderness. There is no rebound and no guarding.       Mild tenderness in epigastrium without rebound or guarding.  Genitourinary:  No cva tenderness  Musculoskeletal: She exhibits no edema and no tenderness.  Neurological: She is alert.  Skin: Skin is warm and dry. She is not diaphoretic.  Psychiatric: She has a normal mood and affect. Her behavior is normal. Thought content normal.    ED Course  Procedures (including critical care time)  Labs Reviewed  URINALYSIS, ROUTINE W REFLEX MICROSCOPIC - Abnormal; Notable for the following:    Specific Gravity, Urine 1.033 (*)    Leukocytes, UA TRACE (*)    All other components within normal limits  COMPREHENSIVE METABOLIC PANEL - Abnormal; Notable for the following:    Total Bilirubin 0.2 (*)    All other components within normal limits  LIPASE, BLOOD - Abnormal; Notable for the following:    Lipase 104 (*)     All other components within normal limits  PREGNANCY, URINE  CBC  URINE MICROSCOPIC-ADD ON   No results found.   1. Abdominal pain   2. Nausea & vomiting   3. Chronic pain   4. Drug-seeking behavior       MDM  30yF with abdominal pain. Chronic in nature. Does not appear to be acute change. Very low clinical suspicion for surgical abdomen. Lipase is mildly elevated, but improved from one week ago. Transaminitis has resolved from last evaluation. Patient reports ongoing nausea and vomiting with inability to tolerate anything by mouth. Patient drank a Coke in the emergency room with no subsequent emesis. Labs are not consistent with reported hx of ongoing emesis. When discussed with pt that would not be getting pain medication and need to establish primary care and make appointment with pain management, she became confrontational. Acussed me of not listening to her although pt had to be asked to look up from her cell phone when discussing DC instructions. Pt with 32 ER visits within past 6 months within [facility] system and additional visits to at least one other facility.        Raeford Razor, MD 10/30/11 [phone]

## 2011-10-25 NOTE — ED Notes (Signed)
Pt requesting pain medicine, EDP notified. EDP reporting no pain medicine until blood and urine results. Pt notified, pt reporting cannot urinate at this time.

## 2011-10-27 ENCOUNTER — Encounter (HOSPITAL_COMMUNITY): Payer: Self-pay | Admitting: Emergency Medicine

## 2011-10-27 ENCOUNTER — Emergency Department (HOSPITAL_COMMUNITY)
Admission: EM | Admit: 2011-10-27 | Discharge: 2011-10-28 | Disposition: A | Discharge status: Home / Self Care | Payer: Medicaid Other | Attending: Emergency Medicine | Admitting: Emergency Medicine

## 2011-10-27 DIAGNOSIS — R112 Nausea with vomiting, unspecified: Secondary | ICD-10-CM | POA: Insufficient documentation

## 2011-10-27 DIAGNOSIS — G8929 Other chronic pain: Secondary | ICD-10-CM | POA: Insufficient documentation

## 2011-10-27 DIAGNOSIS — R1013 Epigastric pain: Secondary | ICD-10-CM | POA: Insufficient documentation

## 2011-10-27 DIAGNOSIS — R109 Unspecified abdominal pain: Secondary | ICD-10-CM

## 2011-10-27 LAB — COMPREHENSIVE METABOLIC PANEL
ALT: 15 U/L (ref 0–35)
CO2: 23 mEq/L (ref 19–32)
Calcium: 9.2 mg/dL (ref 8.4–10.5)
Creatinine, Ser: 0.61 mg/dL (ref 0.50–1.10)
GFR calc Af Amer: >90 mL/min (ref >90)
GFR calc non Af Amer: >90 mL/min (ref >90)
Glucose, Bld: 74 mg/dL (ref 70–99)

## 2011-10-27 LAB — DIFFERENTIAL
Basophils Absolute: 0 10*3/uL (ref 0.0–0.1)
Basophils Relative: 0 % (ref 0–1)
Monocytes Relative: 9 % (ref 3–12)
Neutro Abs: 2.8 10*3/uL (ref 1.7–7.7)
Neutrophils Relative %: 41 % — ABNORMAL LOW (ref 43–77)

## 2011-10-27 LAB — LIPASE, BLOOD: Lipase: 54 U/L (ref 11–59)

## 2011-10-27 LAB — CBC
Hemoglobin: 13.2 g/dL (ref 12.0–15.0)
MCHC: 34.7 g/dL (ref 30.0–36.0)
RBC: 4.82 MIL/uL (ref 3.87–5.11)

## 2011-10-27 MED ORDER — SODIUM CHLORIDE 0.9 % IV BOLUS (SEPSIS)
1000.0000 mL | Freq: Once | INTRAVENOUS | Status: AC
  Administered 2011-10-27: 1000 mL via INTRAVENOUS

## 2011-10-27 MED ORDER — ONDANSETRON HCL 4 MG/2ML IJ SOLN
4.0000 mg | Freq: Once | INTRAMUSCULAR | Status: AC
Start: 2011-10-27 — End: 2011-10-27
  Administered 2011-10-27: 4 mg via INTRAVENOUS
  Filled 2011-10-27: qty 2

## 2011-10-27 MED ORDER — ONDANSETRON HCL 8 MG PO TABS: 8.0000 mg | ORAL_TABLET | Freq: Three times a day (TID) | ORAL | Status: DC | PRN

## 2011-10-27 MED ORDER — HYDROMORPHONE HCL PF 1 MG/ML IJ SOLN
1.0000 mg | Freq: Once | INTRAMUSCULAR | Status: AC
  Administered 2011-10-27: 1 mg via INTRAVENOUS
  Filled 2011-10-27: qty 1

## 2011-10-27 MED ORDER — DIPHENHYDRAMINE HCL 12.5 MG/5ML PO ELIX
25.0000 mg | ORAL_SOLUTION | Freq: Once | ORAL | Status: AC
  Administered 2011-10-28: 25 mg via ORAL
  Filled 2011-10-27: qty 10

## 2011-10-27 MED ORDER — HYDROMORPHONE HCL PF 1 MG/ML IJ SOLN
1.0000 mg | Freq: Once | INTRAMUSCULAR | Status: AC
  Administered 2011-10-28: 1 mg via INTRAVENOUS
  Filled 2011-10-27: qty 1

## 2011-10-27 MED ORDER — HYDROMORPHONE HCL 2 MG PO TABS: 2.0000 mg | ORAL_TABLET | Freq: Four times a day (QID) | ORAL | Status: DC | PRN

## 2011-10-27 MED ORDER — DIPHENHYDRAMINE HCL 50 MG/ML IJ SOLN
25.0000 mg | Freq: Once | INTRAMUSCULAR | Status: AC
  Administered 2011-10-27: 25 mg via INTRAVENOUS
  Filled 2011-10-27: qty 1

## 2011-10-27 MED ORDER — GI COCKTAIL ~~LOC~~
30.0000 mL | Freq: Once | ORAL | Status: DC
  Filled 2011-10-27: qty 30

## 2011-10-27 NOTE — ED Provider Notes (Addendum)
History     CSN: [id number]  Arrival date & time 10/27/11  [place]   First MD Initiated Contact with Patient 10/27/11 2053      Chief Complaint  Patient presents with  . Abdominal Pain    HPI This young female with chronic abdominal pain, chronic lipase elevations no presents with concerns over continued abdominal pain.  She notes that she has had pain for years, particularly over the past few weeks her pain has seemingly been more severe.  The pain is focally about the epigastrium with minimal diffuse radiation.  The pain is causing her to be anorexic.  She notes mild improvement with medication.  She also has concurrent nausea, vomiting.  Notably, the patient has a GI appointment for next week. Past Medical History  Diagnosis Date  . Pancreatitis   . S/P laparoscopic cholecystectomy   . Fibroids   . Ovarian cyst   . Chronic abdominal pain   . Anemia     Past Surgical History  Procedure Date  . Cholecystectomy   . Abdominal hysterectomy   . Dilation and curettage of uterus     Family History  Problem Relation Age of Onset  . Diabetes Mother   . Hypertension Mother   . Hypertension Father   . Diabetes Father   . Asthma Daughter     History  Substance Use Topics  . Smoking status: Current Everyday Smoker -- 0.5 packs/day for 10 years    Types: Cigarettes  . Smokeless tobacco: Never Used   Comment: declined  . Alcohol Use: No    OB History    Grav Para Term Preterm Abortions TAB SAB Ect Mult Living   4 2 1 1 2  2          Review of Systems  Constitutional:       HPI  HENT:       HPI otherwise negative  Eyes: Negative.   Respiratory:       HPI, otherwise negative  Cardiovascular:       HPI, otherwise nmegative  Gastrointestinal: Positive for vomiting and abdominal pain. Negative for diarrhea.  Genitourinary:       HPI, otherwise negative  Musculoskeletal:       HPI, otherwise negative  Skin: Negative.   Neurological: Negative for syncope.     Allergies  Celecoxib; Ketorolac tromethamine; Meperidine hcl; Fentanyl; Ibuprofen; Morphine; Propoxacet-n; and Tramadol hcl  Home Medications   Current Outpatient Rx  Name Route Sig Dispense Refill  . DIPHENHYDRAMINE HCL 12.5 MG/5ML PO LIQD Oral Take 50 mg by mouth 4 (four) times daily as needed. For itching.    Marland Kitchen ESTRADIOL 2 MG PO TABS Oral Take 2 mg by mouth daily.    Marland Kitchen HYDROMORPHONE HCL 2 MG PO TABS Oral Take 2 mg by mouth every 4 (four) hours as needed. For pain    . METOCLOPRAMIDE HCL 10 MG PO TABS Oral Take 10 mg by mouth every 6 (six) hours as needed. For nausea    . ONDANSETRON HCL 8 MG PO TABS Oral Take 8 mg by mouth every 8 (eight) hours as needed. For nausea      BP 109/79  Pulse 79  Temp(Src) 97.6 F (36.4 C) (Oral)  Resp 19  SpO2 100%  LMP 06/01/2011  Physical Exam  Nursing note and vitals reviewed. Constitutional: She is oriented to person, place, and time. She appears well-developed and well-nourished. No distress.       Young female in no distress, sitting  upright, speaking clearly throughout the interview.  HENT:  Head: Normocephalic and atraumatic.  Eyes: Conjunctivae and EOM are normal.  Cardiovascular: Normal rate and regular rhythm.   Pulmonary/Chest: Effort normal and breath sounds normal. No stridor. No respiratory distress.  Abdominal: Soft. She exhibits no distension. There is tenderness in the epigastric area. There is no rigidity, no rebound and no guarding.  Musculoskeletal: She exhibits no edema.  Neurological: She is alert and oriented to person, place, and time. No cranial nerve deficit.  Skin: Skin is warm and dry.  Psychiatric: She has a normal mood and affect.    ED Course  Procedures (including critical care time)  Labs Reviewed  DIFFERENTIAL - Abnormal; Notable for the following:    Neutrophils Relative 41 (*)    All other components within normal limits  CBC  COMPREHENSIVE METABOLIC PANEL  LIPASE, BLOOD  URINALYSIS, ROUTINE W  REFLEX MICROSCOPIC  PREGNANCY, URINE   No results found.   No diagnosis found.    MDM  This 31 -year-old female with chronic abdominal pain, chronic lab abnormalities now presents with ongoing abdominal pain.  On my initial exam the patient is in no distress, sitting upright, speaking clearly with unremarkable vital signs.  Patient does have mild epigastric discomfort.  Given the patient's lack of distress, the history, there is low suspicion for acute new pathology.  The patient had labs which were consistent with prior results and demonstrated a decrease in her lipase.  She achieved significant relief with IV fluids, medication.  She is discharged in stable condition to follow up with GI.    Gerhard Munch, MD 10/27/11 2310  Gerhard Munch, MD 10/27/11 2337

## 2011-10-27 NOTE — ED Notes (Signed)
Patient with abdominal pain for last three weeks.  Patient states she has spoken with GI doctor, appointment next week.  Unable to take the pain.

## 2011-10-27 NOTE — Discharge Instructions (Signed)

## 2011-10-27 NOTE — ED Notes (Signed)
Patient states she has pancreatitis and has been having abdominal pain x 4 weeks.  Patient states she has N/V/D and chills with sweats.

## 2011-10-27 NOTE — ED Notes (Signed)
IV team at bedside 

## 2011-11-03 ENCOUNTER — Emergency Department (HOSPITAL_COMMUNITY)
Admission: EM | Admit: 2011-11-03 | Discharge: 2011-11-04 | Disposition: A | Discharge status: Home / Self Care | Payer: Medicaid Other | Attending: Emergency Medicine | Admitting: Emergency Medicine

## 2011-11-03 ENCOUNTER — Encounter (HOSPITAL_COMMUNITY): Payer: Self-pay | Admitting: Emergency Medicine

## 2011-11-03 DIAGNOSIS — K0889 Other specified disorders of teeth and supporting structures: Secondary | ICD-10-CM

## 2011-11-03 DIAGNOSIS — K089 Disorder of teeth and supporting structures, unspecified: Secondary | ICD-10-CM | POA: Insufficient documentation

## 2011-11-03 NOTE — ED Notes (Signed)
PT. REPORTS LEFT LOWER MOLAR PAIN AND LEFT EARACHE FOR SEVERAL DAYS .

## 2011-11-04 MED ORDER — PENICILLIN V POTASSIUM 250 MG PO TABS
500.0000 mg | ORAL_TABLET | Freq: Once | ORAL | Status: AC
  Administered 2011-11-04: 500 mg via ORAL
  Filled 2011-11-04: qty 2

## 2011-11-04 MED ORDER — OXYCODONE-ACETAMINOPHEN 5-325 MG PO TABS
1.0000 | ORAL_TABLET | Freq: Once | ORAL | Status: AC
  Administered 2011-11-04: 1 via ORAL
  Filled 2011-11-04: qty 1

## 2011-11-04 MED ORDER — HYDROCODONE-ACETAMINOPHEN 7.5-500 MG/15ML PO SOLN: 15.0000 mL | Freq: Four times a day (QID) | ORAL | Status: AC | PRN

## 2011-11-04 MED ORDER — PENICILLIN V POTASSIUM 500 MG PO TABS: 500.0000 mg | ORAL_TABLET | Freq: Three times a day (TID) | ORAL | Status: AC

## 2011-11-04 NOTE — ED Provider Notes (Signed)
Medical screening examination/treatment/procedure(s) were performed by non-physician practitioner and as supervising physician I was immediately available for consultation/collaboration.  Marquavis Hannen L Darryl Willner, MD 11/04/11 0649 

## 2011-11-04 NOTE — ED Provider Notes (Signed)
History     CSN: [id number]  Arrival date & time 11/03/11  2340   First MD Initiated Contact with Patient 11/04/11 0104      Chief Complaint  Patient presents with  . Dental Pain    (Consider location/radiation/quality/duration/timing/severity/associated sxs/prior treatment) HPI  31 year-old female well know to our ED with hx of chronic abdominal pain presents complaining of dental pain.  Sts for the past few days she has been having gradual onset of pain to L lower teeth.  Pain is throbbing, worsening with eating, and with cold air. Patient denies fever or chills. Has tried Orajel, and liquid pain medication without adequate relief. Denies any recent trauma. Denies hearing changes, or throat swelling, or neck pain.  Past Medical History  Diagnosis Date  . Pancreatitis   . S/P laparoscopic cholecystectomy   . Fibroids   . Ovarian cyst   . Chronic abdominal pain   . Anemia     Past Surgical History  Procedure Date  . Cholecystectomy   . Abdominal hysterectomy   . Dilation and curettage of uterus     Family History  Problem Relation Age of Onset  . Diabetes Mother   . Hypertension Mother   . Hypertension Father   . Diabetes Father   . Asthma Daughter     History  Substance Use Topics  . Smoking status: Current Everyday Smoker -- 0.5 packs/day for 10 years    Types: Cigarettes  . Smokeless tobacco: Never Used   Comment: declined  . Alcohol Use: No    OB History    Grav Para Term Preterm Abortions TAB SAB Ect Mult Living   4 2 1 1 2  2          Review of Systems  All other systems reviewed and are negative.    Allergies  Celecoxib; Ketorolac tromethamine; Meperidine hcl; Fentanyl; Ibuprofen; Morphine; Propoxyphene-acetaminophen; and Tramadol hcl  Home Medications   Current Outpatient Rx  Name Route Sig Dispense Refill  . ESTRADIOL 2 MG PO TABS Oral Take 2 mg by mouth at bedtime.     Marland Kitchen HYDROMORPHONE HCL 2 MG PO TABS Oral Take 1 tablet (2 mg total) by  mouth every 6 (six) hours as needed. For pain 8 tablet 0    BP 126/84  Pulse 94  Temp 98.2 F (36.8 C)  Resp 16  SpO2 95%  LMP 06/01/2011  Physical Exam  Nursing note and vitals reviewed. Constitutional: She appears well-developed and well-nourished. No distress.  HENT:  Head: Normocephalic and atraumatic.  Right Ear: External ear normal.  Left Ear: External ear normal.  Mouth/Throat: Oropharynx is clear and moist. No oropharyngeal exudate.      ED Course  Procedures (including critical care time)  Labs Reviewed - No data to display No results found.   No diagnosis found.    MDM  C/o dental pain. abx given and f/u referral to dentist.  Is afebrile, no evidence of dental abscess, VSS.  No TMJ, no ear involvement.    1:25 AM Pt request pain medication, denies allergic reaction to hydrocodone.  Will prescribe lortab elixir      Fayrene Helper, PA-C 11/04/11 0123  Fayrene Helper, PA-C 11/04/11 0126

## 2011-11-04 NOTE — Discharge Instructions (Signed)
Dental Pain  A tooth ache may be caused by cavities (tooth decay). Cavities expose the nerve of the tooth to air and hot or cold temperatures. It may come from an infection or abscess (also called a boil or furuncle) around your tooth. It is also often caused by dental caries (tooth decay). This causes the pain you are having.  DIAGNOSIS   Your caregiver can diagnose this problem by exam.  TREATMENT   · If caused by an infection, it may be treated with medications which kill germs (antibiotics) and pain medications as prescribed by your caregiver. Take medications as directed.  · Only take over-the-counter or prescription medicines for pain, discomfort, or fever as directed by your caregiver.  · Whether the tooth ache today is caused by infection or dental disease, you should see your dentist as soon as possible for further care.  SEEK MEDICAL CARE IF:  The exam and treatment you received today has been provided on an emergency basis only. This is not a substitute for complete medical or dental care. If your problem worsens or new problems (symptoms) appear, and you are unable to meet with your dentist, call or return to this location.  SEEK IMMEDIATE MEDICAL CARE IF:   · You have a fever.  · You develop redness and swelling of your face, jaw, or neck.  · You are unable to open your mouth.  · You have severe pain uncontrolled by pain medicine.  MAKE SURE YOU:   · Understand these instructions.  · Will watch your condition.  · Will get help right away if you are not doing well or get worse.  Document Released: 06/21/2005 Document Revised: 06/10/2011 Document Reviewed: 02/07/2008  Exit[employer]® Patient Information ©2012 [employer].

## 2011-11-07 ENCOUNTER — Encounter (HOSPITAL_COMMUNITY): Payer: Self-pay | Admitting: *Deleted

## 2011-11-07 ENCOUNTER — Emergency Department (HOSPITAL_COMMUNITY)
Admission: EM | Admit: 2011-11-07 | Discharge: 2011-11-08 | Disposition: A | Discharge status: Home / Self Care | Payer: Medicaid Other | Attending: Emergency Medicine | Admitting: Emergency Medicine

## 2011-11-07 DIAGNOSIS — G8929 Other chronic pain: Secondary | ICD-10-CM | POA: Insufficient documentation

## 2011-11-07 DIAGNOSIS — R109 Unspecified abdominal pain: Secondary | ICD-10-CM | POA: Insufficient documentation

## 2011-11-07 DIAGNOSIS — R197 Diarrhea, unspecified: Secondary | ICD-10-CM | POA: Insufficient documentation

## 2011-11-07 DIAGNOSIS — F17200 Nicotine dependence, unspecified, uncomplicated: Secondary | ICD-10-CM | POA: Insufficient documentation

## 2011-11-07 DIAGNOSIS — R112 Nausea with vomiting, unspecified: Secondary | ICD-10-CM | POA: Insufficient documentation

## 2011-11-07 DIAGNOSIS — K861 Other chronic pancreatitis: Secondary | ICD-10-CM | POA: Insufficient documentation

## 2011-11-07 NOTE — ED Notes (Signed)
Pt with chronic pancreatitis is here for abdominal pain.  Pt states that she thinks her "enzyme levels are flared up".  Pt also reports some dizziness with this.  Pt is alert and oriented in triage

## 2011-11-08 LAB — COMPREHENSIVE METABOLIC PANEL
ALT: 46 U/L — ABNORMAL HIGH (ref 0–35)
AST: 29 U/L (ref 0–37)
Albumin: 4.2 g/dL (ref 3.5–5.2)
Alkaline Phosphatase: 94 U/L (ref 39–117)
Glucose, Bld: 77 mg/dL (ref 70–99)
Potassium: 3.5 mEq/L (ref 3.5–5.1)
Sodium: 140 mEq/L (ref 135–145)
Total Protein: 8.4 g/dL — ABNORMAL HIGH (ref 6.0–8.3)

## 2011-11-08 LAB — CBC
HCT: 41.2 % (ref 36.0–46.0)
Hemoglobin: 14.1 g/dL (ref 12.0–15.0)
MCH: 27.6 pg (ref 26.0–34.0)
MCHC: 34.2 g/dL (ref 30.0–36.0)
MCV: 80.8 fL (ref 78.0–100.0)

## 2011-11-08 MED ORDER — PROMETHAZINE HCL 25 MG/ML IJ SOLN
25.0000 mg | INTRAMUSCULAR | Status: AC
  Administered 2011-11-08: 25 mg via INTRAMUSCULAR
  Filled 2011-11-08: qty 1

## 2011-11-08 MED ORDER — DIPHENHYDRAMINE HCL 50 MG/ML IJ SOLN
25.0000 mg | Freq: Once | INTRAMUSCULAR | Status: AC
  Administered 2011-11-08: 25 mg via INTRAMUSCULAR
  Filled 2011-11-08: qty 1

## 2011-11-08 MED ORDER — HYDROMORPHONE HCL PF 1 MG/ML IJ SOLN
1.0000 mg | Freq: Once | INTRAMUSCULAR | Status: AC
  Administered 2011-11-08: 1 mg via INTRAMUSCULAR
  Filled 2011-11-08: qty 1

## 2011-11-08 NOTE — ED Notes (Signed)
Pt refuses phenergan IM at this time, requests to wait until her labwork comes back.

## 2011-11-08 NOTE — ED Notes (Signed)
Lab at bedside to draw blood.

## 2011-11-08 NOTE — ED Notes (Signed)
No rx given, pt voiced understanding to f/u with PCP tomorrow as scheduled.

## 2011-11-08 NOTE — ED Notes (Signed)
Pt c/o chronic abdominal pain. Also states n/v/d for 3 weeks, unable to keep down food or medication.  Pt alert and oriented, MAE, ambulatory.

## 2011-11-08 NOTE — Discharge Instructions (Signed)
Please followup today as scheduled with your doctor. Stick to clear liquid diet until you're feeling better. Take all medications as prescribed  Clear Liquid Diet The clear liquid dietconsists of foods that are liquid or will become liquid at room temperature.You should be able to see through the liquid and beverages. Examples of foods allowed on a clear liquid diet include fruit juice, broth or bouillon, gelatin, or frozen ice pops. The purpose of this diet is to provide necessary fluid, electrolytes such as sodium and potassium, and energy to keep the body functioning during times when you are not able to consume a regular diet.A clear liquid diet should not be continued for long periods of time as it is not nutritionally adequate.  REASONS FOR USING A CLEAR LIQUID DIET  In sudden onset (acute) conditions for a patient before or after surgery.   As the first step in oral feeding.   For fluid and electrolyte replacement in diarrheal diseases.   As a diet before certain medical tests are performed.  ADEQUACY The clear liquid diet is adequate only in ascorbic acid, according to the Recommended Dietary Allowances of the [employer]. CHOOSING FOODS Breads and Starches  Allowed:  None are allowed.   Avoid: All are avoided.  Vegetables  Allowed:  Strained tomato or vegetable juice.   Avoid: Any others.  Fruit  Allowed:  Strained fruit juices and fruit drinks. Include 1 serving of citrus or vitamin C-enriched fruit juice daily.   Avoid: Any others.  Meat and Meat Substitutes  Allowed:  None are allowed.   Avoid: All are avoided.  Milk  Allowed:  None are allowed.   Avoid: All are avoided.  Soups and Combination Foods  Allowed:  Clear bouillon, broth, or strained broth-based soups.   Avoid: Any others.  Desserts and Sweets  Allowed:  Sugar, honey. High protein gelatin. Flavored gelatin, ices, or frozen ice pops that do not contain milk.   Avoid: Any  others.  Fats and Oils  Allowed:  None are allowed.   Avoid: All are avoided.  Beverages  Allowed: Cereal beverages, coffee (regular or decaffeinated), tea, or soda at the discretion of your caregiver.   Avoid: Any others.  Condiments  Allowed:  Iodized salt.   Avoid: Any others, including pepper.  Supplements  Allowed:  Liquid nutrition beverages.   Avoid: Any others that contain lactose or fiber.  SAMPLE MEAL PLAN Breakfast  4 oz (120 mL) strained orange juice.    to 1 cup (125 to 250 mL) gelatin (plain or fortified).   1 cup (250 mL) beverage (coffee or tea).   Sugar, if desired.  Midmorning Snack   cup (125 mL) gelatin (plain or fortified).  Lunch  1 cup (250 mL) broth or consomm.   4 oz (120 mL) strained grapefruit juice.    cup (125 mL) gelatin (plain or fortified).   1 cup (250 mL) beverage (coffee or tea).   Sugar, if desired.  Midafternoon Snack   cup (125 mL) fruit ice.    cup (125 mL) strained fruit juice.  Dinner  1 cup (250 mL) broth or consomm.    cup (125 mL) cranberry juice.    cup (125 mL) flavored gelatin (plain or fortified).   1 cup (250 mL) beverage (coffee or tea).   Sugar, if desired.  Evening Snack  4 oz (120 mL) strained apple juice (vitamin C-fortified).    cup (125 mL) flavored gelatin (plain or fortified).  Document Released: 06/21/2005  Document Revised: 06/10/2011 Document Reviewed: 09/18/2010 [facility] Patient Information 2012 [place], [place].Clear Liquid Diet The clear liquid dietconsists of foods that are liquid or will become liquid at room temperature.You should be able to see through the liquid and beverages. Examples of foods allowed on a clear liquid diet include fruit juice, broth or bouillon, gelatin, or frozen ice pops. The purpose of this diet is to provide necessary fluid, electrolytes such as sodium and potassium, and energy to keep the body functioning during times when you are not able to  consume a regular diet.A clear liquid diet should not be continued for long periods of time as it is not nutritionally adequate.  REASONS FOR USING A CLEAR LIQUID DIET  In sudden onset (acute) conditions for a patient before or after surgery.   As the first step in oral feeding.   For fluid and electrolyte replacement in diarrheal diseases.   As a diet before certain medical tests are performed.  ADEQUACY The clear liquid diet is adequate only in ascorbic acid, according to the Recommended Dietary Allowances of the [employer]. CHOOSING FOODS Breads and Starches  Allowed:  None are allowed.   Avoid: All are avoided.  Vegetables  Allowed:  Strained tomato or vegetable juice.   Avoid: Any others.  Fruit  Allowed:  Strained fruit juices and fruit drinks. Include 1 serving of citrus or vitamin C-enriched fruit juice daily.   Avoid: Any others.  Meat and Meat Substitutes  Allowed:  None are allowed.   Avoid: All are avoided.  Milk  Allowed:  None are allowed.   Avoid: All are avoided.  Soups and Combination Foods  Allowed:  Clear bouillon, broth, or strained broth-based soups.   Avoid: Any others.  Desserts and Sweets  Allowed:  Sugar, honey. High protein gelatin. Flavored gelatin, ices, or frozen ice pops that do not contain milk.   Avoid: Any others.  Fats and Oils  Allowed:  None are allowed.   Avoid: All are avoided.  Beverages  Allowed: Cereal beverages, coffee (regular or decaffeinated), tea, or soda at the discretion of your caregiver.   Avoid: Any others.  Condiments  Allowed:  Iodized salt.   Avoid: Any others, including pepper.  Supplements  Allowed:  Liquid nutrition beverages.   Avoid: Any others that contain lactose or fiber.  SAMPLE MEAL PLAN Breakfast  4 oz (120 mL) strained orange juice.    to 1 cup (125 to 250 mL) gelatin (plain or fortified).   1 cup (250 mL) beverage (coffee or tea).   Sugar, if desired.    Midmorning Snack   cup (125 mL) gelatin (plain or fortified).  Lunch  1 cup (250 mL) broth or consomm.   4 oz (120 mL) strained grapefruit juice.    cup (125 mL) gelatin (plain or fortified).   1 cup (250 mL) beverage (coffee or tea).   Sugar, if desired.  Midafternoon Snack   cup (125 mL) fruit ice.    cup (125 mL) strained fruit juice.  Dinner  1 cup (250 mL) broth or consomm.    cup (125 mL) cranberry juice.    cup (125 mL) flavored gelatin (plain or fortified).   1 cup (250 mL) beverage (coffee or tea).   Sugar, if desired.  Evening Snack  4 oz (120 mL) strained apple juice (vitamin C-fortified).    cup (125 mL) flavored gelatin (plain or fortified).  Document Released: 06/21/2005 Document Revised: 06/10/2011 Document Reviewed: 09/18/2010 [employer] Patient Information 2012  [employer].

## 2011-11-08 NOTE — ED Provider Notes (Signed)
History     CSN: [id number]  Arrival date & time 11/07/11  2317   First MD Initiated Contact with Patient 11/07/11 2347      Chief Complaint  Patient presents with  . Abdominal Pain    (Consider location/radiation/quality/duration/timing/severity/associated sxs/prior treatment) HPI 31 year old female presents emergency part complaining of 3 weeks of nausea vomiting and diarrhea with abdominal pain. Patient well-known to emergency room with frequent visits for her acute on chronic pancreatitis. Patient was seen 3 days ago complaining of dental pain and was given Lortab elixir at that time. She did not comment on any vomiting or diarrhea at that time. Patient reports she's been trying to take her medicines at that she has been unable to keep them down. Patient reports she feels dehydrated. Patient is concerned that her lab values will be elevated and she will record her admission. Of note, patient texting on the phone and laughing with lab techs prior to me coming into room Past Medical History  Diagnosis Date  . Pancreatitis   . S/P laparoscopic cholecystectomy   . Fibroids   . Ovarian cyst   . Chronic abdominal pain   . Anemia     Past Surgical History  Procedure Date  . Cholecystectomy   . Abdominal hysterectomy   . Dilation and curettage of uterus     Family History  Problem Relation Age of Onset  . Diabetes Mother   . Hypertension Mother   . Hypertension Father   . Diabetes Father   . Asthma Daughter     History  Substance Use Topics  . Smoking status: Current Everyday Smoker -- 0.5 packs/day for 10 years    Types: Cigarettes  . Smokeless tobacco: Never Used   Comment: declined  . Alcohol Use: No    OB History    Grav Para Term Preterm Abortions TAB SAB Ect Mult Living   4 2 1 1 2  2          Review of Systems  All other systems reviewed and are negative.    Allergies  Celecoxib; Ketorolac tromethamine; Meperidine hcl; Fentanyl; Ibuprofen; Morphine;  Propoxyphene-acetaminophen; and Tramadol hcl  Home Medications   Current Outpatient Rx  Name Route Sig Dispense Refill  . DIPHENHYDRAMINE HCL 12.5 MG/5ML PO ELIX Oral Take 50 mg by mouth 4 (four) times daily as needed. For itching    . ESTRADIOL 2 MG PO TABS Oral Take 2 mg by mouth at bedtime.     Marland Kitchen HYDROCODONE-ACETAMINOPHEN 7.5-500 MG/15ML PO SOLN Oral Take 15 mLs by mouth every 6 (six) hours as needed for pain. 120 mL 0  . HYDROMORPHONE HCL 2 MG PO TABS Oral Take 1 tablet (2 mg total) by mouth every 6 (six) hours as needed. For pain 8 tablet 0  . PENICILLIN V POTASSIUM 500 MG PO TABS Oral Take 1 tablet (500 mg total) by mouth 3 (three) times daily. 30 tablet 0    BP 113/80  Pulse 86  Temp(Src) 98.1 F (36.7 C) (Oral)  Resp 18  SpO2 99%  LMP 06/01/2011  Physical Exam  Vitals reviewed. Constitutional: She appears well-developed and well-nourished. No distress.  HENT:  Head: Normocephalic and atraumatic.  Nose: Nose normal.  Mouth/Throat: Oropharynx is clear and moist. No oropharyngeal exudate.       Mucous membranes moist without signs of dehydration  Eyes: Conjunctivae and EOM are normal. Pupils are equal, round, and reactive to light.       Eyes not sunken  Neck:  Normal range of motion. Neck supple. No JVD present. No tracheal deviation present. No thyromegaly present.  Cardiovascular: Normal rate, regular rhythm, normal heart sounds and intact distal pulses.  Exam reveals no gallop and no friction rub.   No murmur heard. Pulmonary/Chest: Effort normal and breath sounds normal. No stridor. No respiratory distress. She has no wheezes. She has no rales. She exhibits no tenderness.  Abdominal: Soft. Bowel sounds are normal. She exhibits no distension and no mass. There is tenderness (tenderness in epigastrium). There is no rebound and no guarding.  Musculoskeletal: Normal range of motion. She exhibits no edema and no tenderness.  Lymphadenopathy:    She has no cervical  adenopathy.  Skin: Skin is warm. No rash noted. No erythema. No pallor.  Psychiatric: She has a normal mood and affect. Her behavior is normal. Judgment and thought content normal.    ED Course  Procedures (including critical care time)  Labs Reviewed  COMPREHENSIVE METABOLIC PANEL - Abnormal; Notable for the following:    Total Protein 8.4 (*)    ALT 46 (*)    Total Bilirubin 0.1 (*)    All other components within normal limits  LIPASE, BLOOD - Abnormal; Notable for the following:    Lipase 206 (*)    All other components within normal limits  CBC   No results found.   No diagnosis found.    MDM  31 year old female with flare of her chronic pain and reported 3 weeks of nausea vomiting and diarrhea. Patient has followup tomorrow with her doctor. Patient does not appear to be clinically dehydrated. She does have slight elevation in her lipase, but not as high she has had the past. Patient had one episode of vomiting here in the emergency department, but was able to keep down water after receiving nausea medications. Feel patient is safe to go home to followup later today with her doctor    2:57 AM Pt feeling better, no further n/v.  Pt has appt with pcm tomorrow for followup.  Olivia Mackie, MD 11/08/11 [phone]

## 2011-11-18 ENCOUNTER — Encounter (HOSPITAL_COMMUNITY): Payer: Self-pay

## 2011-11-18 ENCOUNTER — Emergency Department (HOSPITAL_COMMUNITY)
Admission: EM | Admit: 2011-11-18 | Discharge: 2011-11-18 | Disposition: A | Discharge status: Home / Self Care | Payer: Medicaid Other | Attending: Emergency Medicine | Admitting: Emergency Medicine

## 2011-11-18 DIAGNOSIS — R1012 Left upper quadrant pain: Secondary | ICD-10-CM | POA: Insufficient documentation

## 2011-11-18 DIAGNOSIS — G8929 Other chronic pain: Secondary | ICD-10-CM | POA: Insufficient documentation

## 2011-11-18 DIAGNOSIS — M549 Dorsalgia, unspecified: Secondary | ICD-10-CM | POA: Insufficient documentation

## 2011-11-18 DIAGNOSIS — R112 Nausea with vomiting, unspecified: Secondary | ICD-10-CM | POA: Insufficient documentation

## 2011-11-18 DIAGNOSIS — R1013 Epigastric pain: Secondary | ICD-10-CM | POA: Insufficient documentation

## 2011-11-18 DIAGNOSIS — R109 Unspecified abdominal pain: Secondary | ICD-10-CM

## 2011-11-18 LAB — DIFFERENTIAL
Eosinophils Relative: 5 % (ref 0–5)
Lymphocytes Relative: 44 % (ref 12–46)
Lymphs Abs: 3.2 10*3/uL (ref 0.7–4.0)
Monocytes Absolute: 0.7 10*3/uL (ref 0.1–1.0)
Neutro Abs: 3 10*3/uL (ref 1.7–7.7)

## 2011-11-18 LAB — CBC
HCT: 36.3 % (ref 36.0–46.0)
MCV: 79.6 fL (ref 78.0–100.0)
Platelets: 265 10*3/uL (ref 150–400)
RBC: 4.56 MIL/uL (ref 3.87–5.11)
WBC: 7.2 10*3/uL (ref 4.0–10.5)

## 2011-11-18 LAB — COMPREHENSIVE METABOLIC PANEL
ALT: 37 U/L — ABNORMAL HIGH (ref 0–35)
AST: 22 U/L (ref 0–37)
CO2: 26 mEq/L (ref 19–32)
Calcium: 9 mg/dL (ref 8.4–10.5)
Chloride: 106 mEq/L (ref 96–112)
GFR calc Af Amer: >90 mL/min (ref >90)
GFR calc non Af Amer: >90 mL/min (ref >90)
Glucose, Bld: 88 mg/dL (ref 70–99)
Sodium: 140 mEq/L (ref 135–145)
Total Bilirubin: 0.2 mg/dL — ABNORMAL LOW (ref 0.3–1.2)

## 2011-11-18 LAB — URINALYSIS, ROUTINE W REFLEX MICROSCOPIC
Bilirubin Urine: NEGATIVE
Glucose, UA: NEGATIVE mg/dL
Hgb urine dipstick: NEGATIVE
Ketones, ur: NEGATIVE mg/dL
Protein, ur: NEGATIVE mg/dL
Urobilinogen, UA: 1 mg/dL (ref 0.0–1.0)

## 2011-11-18 MED ORDER — DIPHENHYDRAMINE HCL 25 MG PO CAPS: 50.0000 mg | ORAL_CAPSULE | Freq: Once | ORAL | Status: DC

## 2011-11-18 MED ORDER — GI COCKTAIL ~~LOC~~
30.0000 mL | Freq: Once | ORAL | Status: DC
  Filled 2011-11-18: qty 30

## 2011-11-18 MED ORDER — DIPHENHYDRAMINE HCL 12.5 MG/5ML PO ELIX
50.0000 mg | ORAL_SOLUTION | Freq: Once | ORAL | Status: DC
  Filled 2011-11-18: qty 20

## 2011-11-18 MED ORDER — SODIUM CHLORIDE 0.9 % IV BOLUS (SEPSIS)
1000.0000 mL | Freq: Once | INTRAVENOUS | Status: AC
  Administered 2011-11-18: 1000 mL via INTRAVENOUS

## 2011-11-18 MED ORDER — PROMETHAZINE HCL 25 MG/ML IJ SOLN
25.0000 mg | Freq: Once | INTRAMUSCULAR | Status: DC
  Filled 2011-11-18 (×2): qty 1

## 2011-11-18 MED ORDER — HYDROMORPHONE HCL PF 2 MG/ML IJ SOLN
2.0000 mg | Freq: Once | INTRAMUSCULAR | Status: AC
  Administered 2011-11-18: 2 mg via INTRAVENOUS
  Filled 2011-11-18: qty 1

## 2011-11-18 MED ORDER — ONDANSETRON HCL 4 MG/2ML IJ SOLN
4.0000 mg | Freq: Once | INTRAMUSCULAR | Status: AC
  Administered 2011-11-18: 4 mg via INTRAVENOUS
  Filled 2011-11-18: qty 2

## 2011-11-18 MED ORDER — LORAZEPAM 2 MG/ML IJ SOLN: 1.0000 mg | Freq: Once | INTRAMUSCULAR | Status: DC

## 2011-11-18 MED ORDER — DIPHENHYDRAMINE HCL 25 MG PO CAPS
ORAL_CAPSULE | ORAL | Status: AC
  Filled 2011-11-18: qty 2

## 2011-11-18 NOTE — ED Notes (Signed)
Pt presented to the ER with c/o abdominal pain characterized as sharp and cramping onset 4 weeks ago getting progressively worse. Pt also c/o vomiting and diarrhea. PT stated that she is unable to keep anything down.

## 2011-11-18 NOTE — ED Provider Notes (Addendum)
History   This chart was scribed for Glynn Octave, MD by Charolett Bumpers . The patient was seen in room STRE6/STRE6.    CSN: [id number]  Arrival date & time 11/18/11  8657   First MD Initiated Contact with Patient 11/18/11 1902      Chief Complaint  Patient presents with  . Abdominal Pain    (Consider location/radiation/quality/duration/timing/severity/associated sxs/prior treatment) HPI [name] is a 31 y.o. female who presents to the Emergency Department complaining of constant, moderate LUQ and epigastric abdominal pain for the past 4 weeks. Patient also reports associated nausea and vomiting. Patient states that she has been vomiting 5 times daily. Patient states that she Dilaudid and Zofran for her symptoms, but recently ran out. Patient states that her lipase was in the 200's the last time it was checked. Patient reports a h/o chronic pancreatitis and is a frequent visitor to the ED for her pancreatic symptoms.   Past Medical History  Diagnosis Date  . Pancreatitis   . S/P laparoscopic cholecystectomy   . Fibroids   . Ovarian cyst   . Chronic abdominal pain   . Anemia     Past Surgical History  Procedure Date  . Cholecystectomy   . Abdominal hysterectomy   . Dilation and curettage of uterus     Family History  Problem Relation Age of Onset  . Diabetes Mother   . Hypertension Mother   . Hypertension Father   . Diabetes Father   . Asthma Daughter     History  Substance Use Topics  . Smoking status: Current Everyday Smoker -- 0.5 packs/day for 10 years    Types: Cigarettes  . Smokeless tobacco: Never Used   Comment: declined  . Alcohol Use: No    OB History    Grav Para Term Preterm Abortions TAB SAB Ect Mult Living   4 2 1 1 2  2          Review of Systems  Gastrointestinal: Positive for nausea, vomiting and abdominal pain. Negative for diarrhea.  Musculoskeletal: Positive for back pain.  All other systems reviewed and are  negative.    Allergies  Celecoxib; Ketorolac tromethamine; Meperidine hcl; Fentanyl; Ibuprofen; Morphine; Propoxyphene-acetaminophen; and Tramadol hcl  Home Medications   Current Outpatient Rx  Name Route Sig Dispense Refill  . DIPHENHYDRAMINE HCL 12.5 MG/5ML PO ELIX Oral Take 50 mg by mouth 4 (four) times daily as needed. For itching    . ESTRADIOL 2 MG PO TABS Oral Take 2 mg by mouth at bedtime.     Marland Kitchen HYDROMORPHONE HCL 2 MG PO TABS Oral Take 1 tablet (2 mg total) by mouth every 6 (six) hours as needed. For pain 8 tablet 0    BP 109/69  Pulse 77  Temp(Src) 98.2 F (36.8 C) (Oral)  Resp 18  SpO2 97%  LMP 06/01/2011  Physical Exam  Nursing note and vitals reviewed. Constitutional: She is oriented to person, place, and time. She appears well-developed and well-nourished. No distress.  HENT:  Head: Normocephalic and atraumatic.  Eyes: EOM are normal.  Neck: Neck supple. No tracheal deviation present.  Cardiovascular: Normal rate.   Pulmonary/Chest: Effort normal. No respiratory distress.  Abdominal: Soft. There is tenderness. There is no rebound and no guarding.       Mild LUQ and epigastric tenderness.   Musculoskeletal: Normal range of motion.  Neurological: She is alert and oriented to person, place, and time.  Skin: Skin is warm and dry.  Psychiatric:  She has a normal mood and affect. Her behavior is normal.    ED Course  Procedures (including critical care time)  DIAGNOSTIC STUDIES: Oxygen Saturation is 97% on room air, normal by my interpretation.    COORDINATION OF CARE:  1912: Discussed planned course of treatment with the patient, who is agreeable at this time.     Labs Reviewed  DIFFERENTIAL - Abnormal; Notable for the following:    Neutrophils Relative 41 (*)    All other components within normal limits  COMPREHENSIVE METABOLIC PANEL - Abnormal; Notable for the following:    ALT 37 (*)    Total Bilirubin 0.2 (*)    All other components within normal  limits  LIPASE, BLOOD - Abnormal; Notable for the following:    Lipase 72 (*)    All other components within normal limits  URINALYSIS, ROUTINE W REFLEX MICROSCOPIC - Abnormal; Notable for the following:    APPearance HAZY (*)    Specific Gravity, Urine 1.031 (*)    All other components within normal limits  CBC  PREGNANCY, URINE   No results found.   1. Chronic abdominal pain       MDM  Acute on chronic abdominal pain with nausea, vomiting and diarrhea. Abdomen soft minimally tender in the upper quadrants. No guarding or rebound. Patient does not appear to be dehydrated.  Labs reviewed, lipase improved from previous.  Tolerating by mouth. No vomiting in the ED. Playing games on cellphone.   I personally performed the services described in this documentation, which was scribed in my presence.  The recorded information has been reviewed and considered.      Glynn Octave, MD 11/18/11 2010  Glynn Octave, MD 11/18/11 2012

## 2011-11-18 NOTE — ED Notes (Signed)
Charge Nurse at bedside.

## 2011-11-18 NOTE — ED Notes (Addendum)
Pt stating that she doesn't want the benadryl by mouth and wants it only if she can get IV. Pt refusing to take anything by mouth because she states that it will hurt. Pt given pain medication but still will not try to take anything by mouth

## 2011-11-18 NOTE — ED Notes (Signed)
Ongoing abd pain for 4 weeks hx of pancreattiis.

## 2011-11-18 NOTE — ED Notes (Signed)
Pt states that she wants to talk to the charge nurse. Charge Nurse called and pt informed of his notification

## 2011-11-18 NOTE — Discharge Instructions (Signed)

## 2011-11-18 NOTE — ED Notes (Signed)
Pt states that she is still in pain and wants something else for pain. MD made aware.

## 2011-11-20 ENCOUNTER — Encounter (HOSPITAL_COMMUNITY): Payer: Self-pay | Admitting: *Deleted

## 2011-11-20 ENCOUNTER — Emergency Department (HOSPITAL_COMMUNITY)
Admission: EM | Admit: 2011-11-20 | Discharge: 2011-11-20 | Disposition: A | Discharge status: Home / Self Care | Payer: Medicaid Other | Attending: Emergency Medicine | Admitting: Emergency Medicine

## 2011-11-20 DIAGNOSIS — L723 Sebaceous cyst: Secondary | ICD-10-CM | POA: Insufficient documentation

## 2011-11-20 DIAGNOSIS — L02811 Cutaneous abscess of head [any part, except face]: Secondary | ICD-10-CM

## 2011-11-20 DIAGNOSIS — IMO0002 Reserved for concepts with insufficient information to code with codable children: Secondary | ICD-10-CM | POA: Insufficient documentation

## 2011-11-20 DIAGNOSIS — L02818 Cutaneous abscess of other sites: Secondary | ICD-10-CM | POA: Insufficient documentation

## 2011-11-20 DIAGNOSIS — L03818 Cellulitis of other sites: Secondary | ICD-10-CM | POA: Insufficient documentation

## 2011-11-20 NOTE — ED Provider Notes (Signed)
History     CSN: [id number]  Arrival date & time 11/20/11  2023   First MD Initiated Contact with Patient 11/20/11 2039      Chief Complaint  Patient presents with  . Skin Problem    (Consider location/radiation/quality/duration/timing/severity/associated sxs/prior treatment) HPI Comments: Patient presents with c/o R axillary abscess x 1 month that is worsening in the past several days. Also complains of R occipital abscess on scalp that just started. No drainage from these areas. Patient states she is already taking Bactrim. No fever, N/V. Pain is constant. It is made worse with palpation. Nothing makes it better.   Patient is a 31 y.o. female presenting with abscess. The history is provided by the patient.  Abscess  This is a new problem. The current episode started less than one week ago. The onset was gradual. The problem has been unchanged. Pertinent negatives include no fever and no vomiting.    Past Medical History  Diagnosis Date  . Pancreatitis   . S/P laparoscopic cholecystectomy   . Fibroids   . Ovarian cyst   . Chronic abdominal pain   . Anemia     Past Surgical History  Procedure Date  . Cholecystectomy   . Abdominal hysterectomy   . Dilation and curettage of uterus     Family History  Problem Relation Age of Onset  . Diabetes Mother   . Hypertension Mother   . Hypertension Father   . Diabetes Father   . Asthma Daughter     History  Substance Use Topics  . Smoking status: Current Everyday Smoker -- 0.5 packs/day for 10 years    Types: Cigarettes  . Smokeless tobacco: Never Used   Comment: declined  . Alcohol Use: No    OB History    Grav Para Term Preterm Abortions TAB SAB Ect Mult Living   4 2 1 1 2  2          Review of Systems  Constitutional: Negative for fever.  Gastrointestinal: Negative for nausea and vomiting.  Skin: Negative for color change.       Positive for abscess.  Hematological: Negative for adenopathy.    Allergies    Celecoxib; Ketorolac tromethamine; Meperidine hcl; Fentanyl; Ibuprofen; Morphine; Propoxyphene-acetaminophen; and Tramadol hcl  Home Medications   Current Outpatient Rx  Name Route Sig Dispense Refill  . DIPHENHYDRAMINE HCL 12.5 MG/5ML PO ELIX Oral Take 50 mg by mouth 4 (four) times daily as needed. For itching    . ESTRADIOL 2 MG PO TABS Oral Take 2 mg by mouth at bedtime.       BP 124/67  Pulse 70  Temp(Src) 98.1 F (36.7 C) (Oral)  Resp 16  SpO2 98%  LMP 06/01/2011  Physical Exam  Nursing note and vitals reviewed. Constitutional: She appears well-developed and well-nourished.  HENT:  Head: Normocephalic and atraumatic.  Eyes: Conjunctivae are normal.  Neck: Normal range of motion. Neck supple.  Pulmonary/Chest: No respiratory distress.  Neurological: She is alert.  Skin: Skin is warm and dry.       Right axilla: 1cm x 1cm hard tender area of induration without surrounding erythema.  Right occiput: small area of induration without surrounding erythema or drainage consistent with early abscess.   Psychiatric: She has a normal mood and affect.    ED Course  Procedures (including critical care time)  Labs Reviewed - No data to display No results found.   1. Sebaceous cyst   2. Abscess of scalp  9:30 PM Patient seen and examined. Will I&D axillary abscess. Patient states she is already taking bactrim.    Vital signs reviewed and are as follows: Filed Vitals:   11/20/11 2029  BP: 124/67  Pulse: 70  Temp: 98.1 F (36.7 C)  Resp: 16   INCISION AND DRAINAGE Performed by: Carolee Rota Consent: Verbal consent obtained. Risks and benefits: risks, benefits and alternatives were discussed Type: abscess  Body area: R axilla  Anesthesia: local infiltration  Local anesthetic: lidocaine 2% with epinephrine  Anesthetic total: 1 ml  Complexity: simple  Drainage: sebum  Drainage amount: small  Packing material: none  Patient tolerance: Patient  tolerated the procedure well with no immediate complications.  The patient was urged to return to the Emergency Department urgently with worsening pain, swelling, expanding erythema especially if it streaks away from the affected area, fever, or if they have any other concerns.   She will use home pain medications and warm compresses on areas.   The patient verbalized understanding and stated agreement with this plan.    MDM  Patient with cyst amenable to incision and drainage.  No signs of cellulitis is surrounding skin.  Will d/c to home.  Patient already taking antibiotics.         [place], [place] 11/21/11 [phone]

## 2011-11-20 NOTE — ED Notes (Signed)
Patient given discharge paperwork; went over discharge instructions with patient.  Patient instructed to return to the ED within 48 hours if symptoms persist, to follow up with primary care physician within one week, and to return to the ED for new, worsening, or concerning symptoms.  Patient given tape/gauze to go home with to apply to area.

## 2011-11-20 NOTE — ED Notes (Addendum)
Patient with c/o abcess to her right axillary and the back of her neck,rightt side.  She noticed the "bumps" there a few weeks ago and has grown in size

## 2011-11-20 NOTE — ED Notes (Signed)
Patient complaining of a boil/abscess underneath her right axilla area for the last month, but with worsening pain starting last night.  Patient also complaining of abscess on the right side of her upper back.  Patient rates pain 10/10 on the numerical pain scale; describes pain as "throbbing".  Patient alert and oriented x4; PERRL present. Upon arrival to room, patient asked to change into gown.  Minor swelling noted to right axilla area.  Will continue to monitor.

## 2011-11-20 NOTE — Discharge Instructions (Signed)
Please read and follow all provided instructions.  Your diagnoses today include:  1. Sebaceous cyst   2. Abscess of scalp     Tests performed today include:  Vital signs. See below for your results today.   Medications prescribed:   None  Home care instructions:   Follow any educational materials contained in this packet  Follow-up instructions: Return to the Emergency Department in 48 hours for a recheck if your symptoms are not significantly improved.  Please follow-up with your primary care provider in the next 1 week for further evaluation of your symptoms. If you do not have a primary care doctor -- see below for referral information.   Return instructions:  Return to the Emergency Department if you have:  Fever  Worsening symptoms  Worsening pain  Worsening swelling  Redness of the skin that moves away from the affected area, especially if it streaks away from the affected area   Any other emergent concerns  Additional Information: If you have recurrent abscesses, try both the following. Use a Qtip to apply an over-the-counter antibiotic to the inside of your nostrils, twice a day for 5 days. Wash your body with over-the-counter Hibaclens once a day for one week and then once every two weeks. This can reduce the amount of bacterial on your skin that causes boils and lead to fewer boils. If you continue to have multiple or recurrent boils, you should see a dermatologist (skin doctor).   Your vital signs today were: BP 124/67  Pulse 70  Temp(Src) 98.1 F (36.7 C) (Oral)  Resp 16  SpO2 98%  LMP 06/01/2011 If your blood pressure (BP) was elevated above 135/85 this visit, please have this repeated by your doctor within one month. -------------- No Primary Care Doctor Call [facility]  [phone] Other agencies that provide inexpensive medical care    [facility]  [phone]    [facility]  [phone]    [facility]   [phone]    [facility]  [phone]    [facility]  [phone]    [facility]  [phone] -------------- RESOURCE GUIDE:  Dental Problems  Patients with Medicaid: [facility] [phone]                                            [phone] [address] Phone:  [phone]                                                   Phone:  [phone]  If unable to pay or uninsured, contact:  [facility] or [facility] to become qualified for the adult dental clinic.  Chronic Pain Problems Contact [facility]  [phone] Patients need to be referred by their primary care doctor.  Insufficient Money for Medicine Contact [employer]:  call "211" or [facility] [phone].  Psychological Services [facility]  [phone] [facility]  [phone] [facility]   [phone] (emergency services [phone])  Substance Abuse Resources [facility]  [phone] [facility] [phone] [facility] [phone] Floydene Flock [phone] Residential & Outpatient Substance Abuse Program  [phone]  Abuse/Neglect [facility] Child Abuse Hotline 9892495163 [facility] Child Abuse Hotline [phone] (After Hours)  Emergency Shelter [facility] [phone]  Maternity Homes Room at the [place] [facility] [place] [phone] [facility] [phone]  [facility] Resources  [facility]     [employer]                          [facility] Dept. [address] [place]                       [address]      [address]  Blondell Reveal Phone:  [phone]                                   Phone:  [phone]                 Phone:   [phone]  [facility] Phone:  [phone]  [facility] Child Abuse Hotline [phone] 2607836876 (After Hours)

## 2011-11-20 NOTE — ED Notes (Signed)
Patient currently resting quietly in bed; no respiratory or acute distress noted.  Patient updated on plan of care; informed patient that we are currently waiting on further orders from EDP.  Patient has no other questions or concerns at this time; will continue to monitor. 

## 2011-11-21 NOTE — ED Provider Notes (Signed)
Medical screening examination/treatment/procedure(s) were performed by non-physician practitioner and as supervising physician I was immediately available for consultation/collaboration.   Joya Gaskins, MD 11/21/11 [phone]

## 2011-11-22 ENCOUNTER — Encounter (HOSPITAL_COMMUNITY): Payer: Self-pay | Admitting: Emergency Medicine

## 2011-11-22 ENCOUNTER — Emergency Department (HOSPITAL_COMMUNITY)
Admission: EM | Admit: 2011-11-22 | Discharge: 2011-11-23 | Disposition: A | Discharge status: Home / Self Care | Payer: Medicaid Other | Attending: Emergency Medicine | Admitting: Emergency Medicine

## 2011-11-22 DIAGNOSIS — K861 Other chronic pancreatitis: Secondary | ICD-10-CM | POA: Insufficient documentation

## 2011-11-22 DIAGNOSIS — F17200 Nicotine dependence, unspecified, uncomplicated: Secondary | ICD-10-CM | POA: Insufficient documentation

## 2011-11-22 DIAGNOSIS — R1013 Epigastric pain: Secondary | ICD-10-CM | POA: Insufficient documentation

## 2011-11-22 DIAGNOSIS — R197 Diarrhea, unspecified: Secondary | ICD-10-CM | POA: Insufficient documentation

## 2011-11-22 NOTE — ED Notes (Signed)
PT. REPORTS GENERALIZED ABDOMINAL PAIN WITH VOMITTING AND DIARRHEA FOR SEVERAL MONTHS WORSE THIS EVENING .

## 2011-11-23 LAB — COMPREHENSIVE METABOLIC PANEL
Alkaline Phosphatase: 96 U/L (ref 39–117)
BUN: 15 mg/dL (ref 6–23)
CO2: 25 mEq/L (ref 19–32)
Chloride: 107 mEq/L (ref 96–112)
Creatinine, Ser: 0.72 mg/dL (ref 0.50–1.10)
GFR calc non Af Amer: >90 mL/min (ref >90)
Glucose, Bld: 80 mg/dL (ref 70–99)
Potassium: 3.8 mEq/L (ref 3.5–5.1)
Total Bilirubin: 0.2 mg/dL — ABNORMAL LOW (ref 0.3–1.2)

## 2011-11-23 LAB — URINALYSIS, ROUTINE W REFLEX MICROSCOPIC
Glucose, UA: NEGATIVE mg/dL
Ketones, ur: NEGATIVE mg/dL
Leukocytes, UA: NEGATIVE
Nitrite: NEGATIVE
Protein, ur: NEGATIVE mg/dL
pH: 6.5 (ref 5.0–8.0)

## 2011-11-23 LAB — DIFFERENTIAL
Basophils Absolute: 0 10*3/uL (ref 0.0–0.1)
Eosinophils Absolute: 0.5 10*3/uL (ref 0.0–0.7)
Eosinophils Relative: 7 % — ABNORMAL HIGH (ref 0–5)
Lymphocytes Relative: 50 % — ABNORMAL HIGH (ref 12–46)
Lymphs Abs: 3.2 10*3/uL (ref 0.7–4.0)
Neutrophils Relative %: 34 % — ABNORMAL LOW (ref 43–77)

## 2011-11-23 LAB — CBC
MCV: 80.2 fL (ref 78.0–100.0)
Platelets: 287 10*3/uL (ref 150–400)
RBC: 4.75 MIL/uL (ref 3.87–5.11)
RDW: 11.9 % (ref 11.5–15.5)
WBC: 6.4 10*3/uL (ref 4.0–10.5)

## 2011-11-23 LAB — POCT PREGNANCY, URINE: Preg Test, Ur: NEGATIVE

## 2011-11-23 MED ORDER — OXYCODONE-ACETAMINOPHEN 5-325 MG PO TABS
2.0000 | ORAL_TABLET | Freq: Once | ORAL | Status: AC
  Administered 2011-11-23: 2 via ORAL
  Filled 2011-11-23: qty 2

## 2011-11-23 MED ORDER — ONDANSETRON 4 MG PO TBDP
8.0000 mg | ORAL_TABLET | Freq: Once | ORAL | Status: DC
  Filled 2011-11-23: qty 2

## 2011-11-23 NOTE — ED Notes (Signed)
Pt. Alert and oriented, discharged to home with family, pt. Ambulatory gait steady, NAD noted

## 2011-11-23 NOTE — ED Notes (Signed)
Received pt. From triage pt.alert and oriented, NAD noted

## 2011-11-23 NOTE — ED Provider Notes (Signed)
History     CSN: [id number]  Arrival date & time 11/22/11  2332   First MD Initiated Contact with Patient 11/23/11 0040      Chief Complaint  Patient presents with  . Abdominal Pain    (Consider location/radiation/quality/duration/timing/severity/associated sxs/prior treatment) HPI Comments: 31 year old female with a history of chronic pancreatitis, chronic abdominal pain, chronic diarrhea who presents with abdominal pain which she describes as left upper cooperative epigastric. This has been persistent for months, seems to be a little worse this evening associated with vomiting and watery diarrhea. Nothing makes this better or worse, no associated fevers, no back pain, no dysuria, no blood in the stools, no swelling chest pain cough or shortness of breath.  Patient is a 31 y.o. female presenting with abdominal pain. The history is provided by the patient and medical records.  Abdominal Pain The primary symptoms of the illness include abdominal pain.    Past Medical History  Diagnosis Date  . Pancreatitis   . S/P laparoscopic cholecystectomy   . Fibroids   . Ovarian cyst   . Chronic abdominal pain   . Anemia     Past Surgical History  Procedure Date  . Cholecystectomy   . Abdominal hysterectomy   . Dilation and curettage of uterus     Family History  Problem Relation Age of Onset  . Diabetes Mother   . Hypertension Mother   . Hypertension Father   . Diabetes Father   . Asthma Daughter     History  Substance Use Topics  . Smoking status: Current Everyday Smoker -- 0.5 packs/day for 10 years    Types: Cigarettes  . Smokeless tobacco: Never Used   Comment: declined  . Alcohol Use: No    OB History    Grav Para Term Preterm Abortions TAB SAB Ect Mult Living   4 2 1 1 2  2          Review of Systems  Gastrointestinal: Positive for abdominal pain.  All other systems reviewed and are negative.    Allergies  Celecoxib; Ketorolac tromethamine; Meperidine  hcl; Fentanyl; Ibuprofen; Morphine; Propoxyphene-acetaminophen; and Tramadol hcl  Home Medications   Current Outpatient Rx  Name Route Sig Dispense Refill  . ESTRADIOL 2 MG PO TABS Oral Take 2 mg by mouth at bedtime.       LMP 06/01/2011  Physical Exam  Nursing note and vitals reviewed. Constitutional: She appears well-developed and well-nourished. No distress.  HENT:  Head: Normocephalic and atraumatic.  Mouth/Throat: Oropharynx is clear and moist. No oropharyngeal exudate.  Eyes: Conjunctivae and EOM are normal. Pupils are equal, round, and reactive to light. Right eye exhibits no discharge. Left eye exhibits no discharge. No scleral icterus.  Neck: Normal range of motion. Neck supple. No JVD present. No thyromegaly present.  Cardiovascular: Normal rate, regular rhythm, normal heart sounds and intact distal pulses.  Exam reveals no gallop and no friction rub.   No murmur heard. Pulmonary/Chest: Effort normal and breath sounds normal. No respiratory distress. She has no wheezes. She has no rales.  Abdominal: Soft. Bowel sounds are normal. She exhibits no distension and no mass. There is tenderness ( Left upper quadrant and epigastric mild tenderness to palpation, no guarding, no masses, non-peritoneal, no lower abdominal tenderness, no right upper quadrant tenderness).  Musculoskeletal: Normal range of motion. She exhibits no edema and no tenderness.  Lymphadenopathy:    She has no cervical adenopathy.  Neurological: She is alert. Coordination normal.  Skin: Skin is  warm and dry. No rash noted. No erythema.  Psychiatric: She has a normal mood and affect. Her behavior is normal.    ED Course  Procedures (including critical care time)  Labs Reviewed  LIPASE, BLOOD - Abnormal; Notable for the following:    Lipase 65 (*)    All other components within normal limits  COMPREHENSIVE METABOLIC PANEL - Abnormal; Notable for the following:    AST 67 (*)    ALT 92 (*)    Total  Bilirubin 0.2 (*)    All other components within normal limits  DIFFERENTIAL - Abnormal; Notable for the following:    Neutrophils Relative 34 (*)    Lymphocytes Relative 50 (*)    Eosinophils Relative 7 (*)    All other components within normal limits  CBC  URINALYSIS, ROUTINE W REFLEX MICROSCOPIC  POCT PREGNANCY, URINE   No results found.   1. Epigastric pain       MDM  Chronic abdominal pain, vital signs  normal, labs pending to prove pancreatitis and dehydration prior to medication demonstration given the patient's frequent visits for abdominal pain. 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 Patient states that she has rectal Phenergan at home, I told her I would not give her any narcotic medications for home, she is amenable to discharge and followup with family doctor as needed. I do not anticipate that her abdominal pain today is from a significant source, she appears in no distress on repeat exam.  NO narcotic Rx given    Vida Roller, MD 11/24/11 [phone]

## 2011-11-24 ENCOUNTER — Encounter (HOSPITAL_COMMUNITY): Payer: Self-pay | Admitting: Emergency Medicine

## 2011-11-24 ENCOUNTER — Emergency Department (HOSPITAL_COMMUNITY)
Admission: EM | Admit: 2011-11-24 | Discharge: 2011-11-24 | Discharge status: Against Medical Advice | Payer: Medicaid Other | Attending: Emergency Medicine | Admitting: Emergency Medicine

## 2011-11-24 ENCOUNTER — Emergency Department: Payer: Self-pay | Admitting: *Deleted

## 2011-11-24 DIAGNOSIS — R109 Unspecified abdominal pain: Secondary | ICD-10-CM | POA: Insufficient documentation

## 2011-11-24 LAB — COMPREHENSIVE METABOLIC PANEL
Alkaline Phosphatase: 101 U/L (ref 50–136)
Bilirubin,Total: 0.2 mg/dL (ref 0.2–1.0)
Calcium, Total: 8.6 mg/dL (ref 8.5–10.1)
Chloride: 108 mmol/L — ABNORMAL HIGH (ref 98–107)
Co2: 25 mmol/L (ref 21–32)
Creatinine: 0.78 mg/dL (ref 0.60–1.30)
EGFR (African American): >60
EGFR (Non-African Amer.): >60
Osmolality: 283 (ref 275–301)
SGOT(AST): 39 U/L — ABNORMAL HIGH (ref 15–37)
SGPT (ALT): 82 U/L — ABNORMAL HIGH
Total Protein: 8.3 g/dL — ABNORMAL HIGH (ref 6.4–8.2)

## 2011-11-24 LAB — CBC
HCT: 39.9 % (ref 35.0–47.0)
MCH: 27.8 pg (ref 26.0–34.0)
MCHC: 34 g/dL (ref 32.0–36.0)
MCV: 82 fL (ref 80–100)
Platelet: 295 10*3/uL (ref 150–440)
RDW: 12.4 % (ref 11.5–14.5)

## 2011-11-24 LAB — LIPASE, BLOOD: Lipase: 257 U/L (ref 73–393)

## 2011-11-24 MED ORDER — FAMOTIDINE 20 MG PO TABS
40.0000 mg | ORAL_TABLET | Freq: Once | ORAL | Status: DC
  Filled 2011-11-24: qty 2

## 2011-11-24 NOTE — ED Provider Notes (Signed)
Medical screening examination/treatment/procedure(s) were performed by non-physician practitioner and as supervising physician I was immediately available for consultation/collaboration.   Glendora Clouatre, MD 11/24/11 2321 

## 2011-11-24 NOTE — ED Notes (Signed)
Pt Pt stating that she wants to leave, because she is tired of waiting and hasn't seen a doctor. Pt informed that she should stay, but if she wants to go she can leave. MD made aware and pt signed elopement form

## 2011-11-24 NOTE — ED Notes (Signed)
Pt c/o possible allergic reaction to coconut that she at accidentally; pt sts some trouble breathing but speaking complete sentences and O2 sats 97%; pt sts itching all over

## 2011-11-24 NOTE — ED Provider Notes (Signed)
History     CSN: [id number]  Arrival date & time 11/24/11  1849   None     Chief Complaint  Patient presents with  . 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  She is observed sitting in the cervical in no distress interacting and playing with her child.  No vomiting.  That diaphoretic or short of breath.  Vital signs are stable  Patient is a 31 y.o. female presenting with allergic reaction.  Allergic Reaction    Past Medical History  Diagnosis Date  . Pancreatitis   . S/P laparoscopic cholecystectomy   . Fibroids   . Ovarian cyst   . Chronic abdominal pain   . Anemia     Past Surgical History  Procedure Date  . Cholecystectomy   . Abdominal hysterectomy   . Dilation and curettage of uterus     Family History  Problem Relation Age of Onset  . Diabetes Mother   . Hypertension Mother   . Hypertension Father   . Diabetes Father   . Asthma Daughter     History  Substance Use Topics  . Smoking status: Current Everyday Smoker -- 0.5 packs/day for 10 years    Types: Cigarettes  . Smokeless tobacco: Never Used   Comment: declined  . Alcohol Use: No    OB History    Grav Para Term Preterm Abortions TAB SAB Ect Mult Living   4 2 1 1 2  2          Review of Systems  Allergies  Celecoxib; Ketorolac tromethamine; Meperidine hcl; Fentanyl; Ibuprofen; Morphine; Propoxyphene-acetaminophen; and Tramadol hcl  Home Medications   Current Outpatient Rx  Name Route Sig Dispense Refill  . ESTRADIOL 2 MG PO TABS Oral Take 2 mg by mouth at bedtime.       BP 122/63  Pulse 81  Temp(Src) 98.2 F (36.8 C) (Oral)  Resp 18  SpO2 97%  LMP 06/01/2011  Physical Exam  ED Course  Procedures (including critical care time)  Labs Reviewed - No data to display No results found.   No  diagnosis found.    MDM  Refused Pepcid PO hat was ordered before exam due, to "allergic reaction."        Arman Filter, NP 11/24/11 2025  Arman Filter, NP 11/24/11 2025  Arman Filter, NP 11/24/11 2026

## 2011-11-25 LAB — URINALYSIS, COMPLETE
Bacteria: NONE SEEN
Bilirubin,UR: NEGATIVE
Blood: NEGATIVE
Glucose,UR: NEGATIVE mg/dL (ref 0–75)
Ketone: NEGATIVE
Leukocyte Esterase: NEGATIVE
Ph: 7 (ref 4.5–8.0)
Protein: NEGATIVE
Specific Gravity: 1.027 (ref 1.003–1.030)
Squamous Epithelial: 1
WBC UR: <1 /HPF (ref 0–5)

## 2011-11-28 ENCOUNTER — Emergency Department (HOSPITAL_COMMUNITY)
Admission: EM | Admit: 2011-11-28 | Discharge: 2011-11-29 | Disposition: A | Discharge status: Home / Self Care | Payer: Medicaid Other | Source: Home / Self Care | Attending: Emergency Medicine | Admitting: Emergency Medicine

## 2011-11-28 DIAGNOSIS — K859 Acute pancreatitis without necrosis or infection, unspecified: Secondary | ICD-10-CM | POA: Insufficient documentation

## 2011-11-28 DIAGNOSIS — R7402 Elevation of levels of lactic acid dehydrogenase (LDH): Secondary | ICD-10-CM | POA: Insufficient documentation

## 2011-11-28 DIAGNOSIS — R7401 Elevation of levels of liver transaminase levels: Secondary | ICD-10-CM | POA: Insufficient documentation

## 2011-11-29 ENCOUNTER — Encounter (HOSPITAL_COMMUNITY): Payer: Self-pay | Admitting: *Deleted

## 2011-11-29 ENCOUNTER — Encounter (HOSPITAL_COMMUNITY): Payer: Self-pay

## 2011-11-29 ENCOUNTER — Emergency Department (HOSPITAL_COMMUNITY)
Admission: EM | Admit: 2011-11-29 | Discharge: 2011-11-29 | Disposition: A | Discharge status: Home / Self Care | Payer: Medicaid Other | Attending: Emergency Medicine | Admitting: Emergency Medicine

## 2011-11-29 DIAGNOSIS — R112 Nausea with vomiting, unspecified: Secondary | ICD-10-CM | POA: Insufficient documentation

## 2011-11-29 DIAGNOSIS — F17200 Nicotine dependence, unspecified, uncomplicated: Secondary | ICD-10-CM | POA: Insufficient documentation

## 2011-11-29 DIAGNOSIS — R109 Unspecified abdominal pain: Secondary | ICD-10-CM | POA: Insufficient documentation

## 2011-11-29 DIAGNOSIS — R10819 Abdominal tenderness, unspecified site: Secondary | ICD-10-CM | POA: Insufficient documentation

## 2011-11-29 DIAGNOSIS — K861 Other chronic pancreatitis: Secondary | ICD-10-CM | POA: Insufficient documentation

## 2011-11-29 LAB — DIFFERENTIAL
Basophils Absolute: 0 10*3/uL (ref 0.0–0.1)
Eosinophils Absolute: 0.5 10*3/uL (ref 0.0–0.7)
Lymphs Abs: 3 10*3/uL (ref 0.7–4.0)
Monocytes Absolute: 0.6 10*3/uL (ref 0.1–1.0)
Monocytes Relative: 7 % (ref 3–12)
Neutrophils Relative %: 50 % (ref 43–77)

## 2011-11-29 LAB — CBC
HCT: 40.5 % (ref 36.0–46.0)
HCT: 38 % (ref 36.0–46.0)
Hemoglobin: 13.2 g/dL (ref 12.0–15.0)
Hemoglobin: 12.8 g/dL (ref 12.0–15.0)
MCH: 27.2 pg (ref 26.0–34.0)
RBC: 5.02 MIL/uL (ref 3.87–5.11)
RBC: 4.71 MIL/uL (ref 3.87–5.11)
RDW: 11.9 % (ref 11.5–15.5)
WBC: 6 10*3/uL (ref 4.0–10.5)

## 2011-11-29 LAB — COMPREHENSIVE METABOLIC PANEL
ALT: 145 U/L — ABNORMAL HIGH (ref 0–35)
AST: 95 U/L — ABNORMAL HIGH (ref 0–37)
Albumin: 3.8 g/dL (ref 3.5–5.2)
Albumin: 3.9 g/dL (ref 3.5–5.2)
BUN: 12 mg/dL (ref 6–23)
CO2: 26 mEq/L (ref 19–32)
Calcium: 9.5 mg/dL (ref 8.4–10.5)
Calcium: 9.3 mg/dL (ref 8.4–10.5)
Creatinine, Ser: 0.71 mg/dL (ref 0.50–1.10)
Creatinine, Ser: 0.75 mg/dL (ref 0.50–1.10)
GFR calc Af Amer: >90 mL/min (ref >90)
GFR calc non Af Amer: >90 mL/min (ref >90)
Glucose, Bld: 78 mg/dL (ref 70–99)
Sodium: 139 mEq/L (ref 135–145)
Total Protein: 7.9 g/dL (ref 6.0–8.3)

## 2011-11-29 LAB — URINALYSIS, ROUTINE W REFLEX MICROSCOPIC
Bilirubin Urine: NEGATIVE
Hgb urine dipstick: NEGATIVE
Nitrite: NEGATIVE
Protein, ur: NEGATIVE mg/dL
Urobilinogen, UA: 1 mg/dL (ref 0.0–1.0)

## 2011-11-29 LAB — LIPASE, BLOOD: Lipase: 1024 U/L — ABNORMAL HIGH (ref 11–59)

## 2011-11-29 MED ORDER — ONDANSETRON 8 MG PO TBDP
8.0000 mg | ORAL_TABLET | Freq: Once | ORAL | Status: AC
  Administered 2011-11-29: 8 mg via ORAL

## 2011-11-29 MED ORDER — FAMOTIDINE IN NACL 20-0.9 MG/50ML-% IV SOLN
20.0000 mg | Freq: Once | INTRAVENOUS | Status: DC
  Filled 2011-11-29: qty 50

## 2011-11-29 MED ORDER — HYDROMORPHONE HCL PF 1 MG/ML IJ SOLN
1.0000 mg | Freq: Once | INTRAMUSCULAR | Status: AC
  Administered 2011-11-29: 1 mg via INTRAVENOUS
  Filled 2011-11-29: qty 1

## 2011-11-29 MED ORDER — DIPHENHYDRAMINE HCL 25 MG PO CAPS
25.0000 mg | ORAL_CAPSULE | Freq: Once | ORAL | Status: DC
  Filled 2011-11-29: qty 1

## 2011-11-29 MED ORDER — ONDANSETRON 8 MG PO TBDP
ORAL_TABLET | ORAL | Status: AC
  Filled 2011-11-29: qty 1

## 2011-11-29 MED ORDER — ONDANSETRON HCL 4 MG/2ML IJ SOLN
4.0000 mg | Freq: Once | INTRAMUSCULAR | Status: AC
  Administered 2011-11-29: 4 mg via INTRAVENOUS
  Filled 2011-11-29: qty 2

## 2011-11-29 MED ORDER — DIPHENHYDRAMINE HCL 12.5 MG/5ML PO ELIX
25.0000 mg | ORAL_SOLUTION | Freq: Once | ORAL | Status: DC
  Filled 2011-11-29: qty 10

## 2011-11-29 MED ORDER — DIPHENHYDRAMINE HCL 50 MG/ML IJ SOLN
25.0000 mg | Freq: Once | INTRAMUSCULAR | Status: AC
  Administered 2011-11-29: 25 mg via INTRAMUSCULAR
  Filled 2011-11-29: qty 1

## 2011-11-29 MED ORDER — DIPHENHYDRAMINE HCL 50 MG/ML IJ SOLN
12.5000 mg | Freq: Once | INTRAMUSCULAR | Status: AC
  Administered 2011-11-29: 12.5 mg via INTRAVENOUS
  Filled 2011-11-29: qty 1

## 2011-11-29 MED ORDER — DIPHENHYDRAMINE HCL 12.5 MG/5ML PO ELIX
25.0000 mg | ORAL_SOLUTION | Freq: Once | ORAL | Status: DC
  Administered 2011-11-29: 25 mg via ORAL

## 2011-11-29 MED ORDER — OXYCODONE-ACETAMINOPHEN 5-325 MG PO TABS: 1.0000 | ORAL_TABLET | ORAL | Status: DC | PRN

## 2011-11-29 MED ORDER — HYDROMORPHONE HCL PF 2 MG/ML IJ SOLN
2.0000 mg | Freq: Once | INTRAMUSCULAR | Status: AC
  Administered 2011-11-29: 2 mg via INTRAMUSCULAR
  Filled 2011-11-29: qty 1

## 2011-11-29 MED ORDER — SODIUM CHLORIDE 0.9 % IV SOLN
Freq: Once | INTRAVENOUS | Status: AC
  Administered 2011-11-29: 75 mL via INTRAVENOUS

## 2011-11-29 NOTE — Discharge Instructions (Signed)
Return to ER for severe or worsening symptoms.  i have offered and recommended that you be admitted for observation and pain control but you have declined - if you should develop severe or worsening pain return to the hospital, otherwise clear Dr. in the morning for followup within one to 2 days.

## 2011-11-29 NOTE — Discharge Instructions (Signed)
Today your lipase, is greatly improved.  His 81, today, as well as your liver function studies, which were decreasing nicely

## 2011-11-29 NOTE — ED Provider Notes (Signed)
History     CSN: [id number]  Arrival date & time 11/29/11  9528   First MD Initiated Contact with Patient 11/29/11 2006      Chief Complaint  Patient presents with  . Abdominal Pain    (Consider location/radiation/quality/duration/timing/severity/associated sxs/prior treatment) HPI Comments: [name] is an unfortunate young woman with a history of pancreatitis.  He was seen [facility] in [facility].  She was found to have elevated transaminases and liver function studies at that time.  She refused admission to the hospital.  Due to childcare issues she was instructed to return if her pain or her vomiting.  Did not improve.  She is again in the emergency room tonight stating that her pain cannot be controlled at home with by mouth medications and she is still nauseous and vomiting.  Despite use of her medications.  She again has all of her children with her but she does have someone that can watch some while.  She is in the emergency department.  She has agreed to stay for a short period of time receiving IV fluids, antiemetics, and pain medication via IV.  Hopefully, this will turn her around.  Patient is a 31 y.o. female presenting with abdominal pain.  Abdominal Pain The primary symptoms of the illness include abdominal pain, nausea and vomiting. The primary symptoms of the illness do not include fever, diarrhea or dysuria. The current episode started more than 2 days ago. The onset of the illness was gradual. The problem has not changed since onset. Nausea began today. The nausea is associated with eating. The nausea is exacerbated by food.  The patient states that she believes she is currently not pregnant. The patient has not had a change in bowel habit.    Past Medical History  Diagnosis Date  . Pancreatitis   . S/P laparoscopic cholecystectomy   . Fibroids   . Ovarian cyst   . Chronic abdominal pain   . Anemia     Past Surgical History  Procedure Date  .  Cholecystectomy   . Abdominal hysterectomy   . Dilation and curettage of uterus     Family History  Problem Relation Age of Onset  . Diabetes Mother   . Hypertension Mother   . Hypertension Father   . Diabetes Father   . Asthma Daughter     History  Substance Use Topics  . Smoking status: Current Everyday Smoker -- 0.5 packs/day for 10 years    Types: Cigarettes  . Smokeless tobacco: Never Used   Comment: declined  . Alcohol Use: No    OB History    Grav Para Term Preterm Abortions TAB SAB Ect Mult Living   4 2 1 1 2  2          Review of Systems  Constitutional: Negative for fever.  Gastrointestinal: Positive for nausea, vomiting and abdominal pain. Negative for diarrhea and abdominal distention.  Genitourinary: Negative for dysuria.  Skin: Negative for wound.  Neurological: Negative for dizziness and headaches.    Allergies  Celecoxib; Ketorolac tromethamine; Meperidine hcl; Fentanyl; Ibuprofen; Morphine; Propoxyphene-acetaminophen; and Tramadol hcl  Home Medications   Current Outpatient Rx  Name Route Sig Dispense Refill  . OXYCODONE-ACETAMINOPHEN 5-325 MG PO TABS Oral Take 1 tablet by mouth every 4 (four) hours as needed for pain. May take 2 tablets PO q 6 hours for severe pain - Do not take with Tylenol as this tablet already contains tylenol 15 tablet 0  BP 112/67  Pulse 92  Temp(Src) 98 F (36.7 C) (Oral)  Resp 14  SpO2 97%  LMP 06/01/2011  Physical Exam  Constitutional: She appears well-developed. She appears distressed.  HENT:  Head: Normocephalic.  Eyes: Pupils are equal, round, and reactive to light.  Neck: Normal range of motion.  Cardiovascular: Normal rate.   Pulmonary/Chest: Effort normal.  Abdominal: She exhibits no distension. There is tenderness in the epigastric area and left upper quadrant. There is no guarding.  Musculoskeletal: Normal range of motion.  Neurological: She is alert.  Skin: Skin is warm and dry.    ED Course    Procedures (including critical care time)   Labs Reviewed  CBC  DIFFERENTIAL  COMPREHENSIVE METABOLIC PANEL  LIPASE, BLOOD   No results found.   No diagnosis found. Patient, states she's feeling better.  She is able to tolerate fluids.  She is requesting to go home  MDM   Status at length treatment plan, and Konnie agrees to stay in the emergency department in CDU for IV hydration, antiemetics, pain control after several hours.  Will try a by mouth challenge if she can tolerate this.  She will be discharged home        Arman Filter, NP 11/29/11 2315  Arman Filter, NP 11/29/11 2316

## 2011-11-29 NOTE — ED Notes (Signed)
ABD PAIN AND A HEADACHE FOR ONE WEEK.  SHE WAS SEEN AT [place] LAST PM

## 2011-11-29 NOTE — ED Notes (Signed)
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

## 2011-11-29 NOTE — ED Provider Notes (Signed)
History     CSN: [id number]  Arrival date & time 11/28/11  2251   First MD Initiated Contact with Patient 11/29/11 0344      Chief Complaint  Patient presents with  . Abdominal Pain  . Nausea  . Emesis  . Diarrhea  . Migraine  . Pancreatitis    (Consider location/radiation/quality/duration/timing/severity/associated sxs/prior treatment) HPI Comments: 31 year old female with a history of pancreatitis which is chronic status post cholecystectomy. She states that she has had worsening abdominal pain over the last several weeks, she has run out of her home pain medication and since that time his belly she has had poor control over her chronic pain. The pain is located in the epigastrium, does not radiate but is associated with nausea and vomiting with each meal. She also admits to having a mild medical last several days and itching. Nothing makes this better, worse with eating, no associated fevers back pain swelling or rashes.  Patient is a 31 y.o. female presenting with abdominal pain, vomiting, diarrhea, and migraine. The history is provided by the patient and medical records.  Abdominal Pain The primary symptoms of the illness include abdominal pain and vomiting.  Emesis  Associated symptoms include abdominal pain.  Diarrhea The primary symptoms include abdominal pain and vomiting.  Migraine Associated symptoms include abdominal pain.    Past Medical History  Diagnosis Date  . Pancreatitis   . S/P laparoscopic cholecystectomy   . Fibroids   . Ovarian cyst   . Chronic abdominal pain   . Anemia     Past Surgical History  Procedure Date  . Cholecystectomy   . Abdominal hysterectomy   . Dilation and curettage of uterus     Family History  Problem Relation Age of Onset  . Diabetes Mother   . Hypertension Mother   . Hypertension Father   . Diabetes Father   . Asthma Daughter     History  Substance Use Topics  . Smoking status: Current Everyday Smoker -- 0.5  packs/day for 10 years    Types: Cigarettes  . Smokeless tobacco: Never Used   Comment: declined  . Alcohol Use: No    OB History    Grav Para Term Preterm Abortions TAB SAB Ect Mult Living   4 2 1 1 2  2          Review of Systems  Gastrointestinal: Positive for vomiting and abdominal pain.  All other systems reviewed and are negative.    Allergies  Celecoxib; Ketorolac tromethamine; Meperidine hcl; Fentanyl; Ibuprofen; Morphine; Propoxyphene-acetaminophen; and Tramadol hcl  Home Medications   Current Outpatient Rx  Name Route Sig Dispense Refill  . OXYCODONE-ACETAMINOPHEN 5-325 MG PO TABS Oral Take 1 tablet by mouth every 4 (four) hours as needed for pain. May take 2 tablets PO q 6 hours for severe pain - Do not take with Tylenol as this tablet already contains tylenol 15 tablet 0    BP 148/92  Pulse 69  Temp(Src) 98.2 F (36.8 C) (Oral)  Resp 18  Ht 5\' 5"  (1.651 m)  Wt 160 lb (72.576 kg)  BMI 26.63 kg/m2  SpO2 100%  LMP 06/01/2011  Physical Exam  Nursing note and vitals reviewed. Constitutional: She appears well-developed and well-nourished. No distress.  HENT:  Head: Normocephalic and atraumatic.  Mouth/Throat: Oropharynx is clear and moist. No oropharyngeal exudate.  Eyes: Conjunctivae and EOM are normal. Pupils are equal, round, and reactive to light. Right eye exhibits no discharge. Left eye exhibits no discharge.  No scleral icterus.  Neck: Normal range of motion. Neck supple. No JVD present. No thyromegaly present.  Cardiovascular: Normal rate, regular rhythm, normal heart sounds and intact distal pulses.  Exam reveals no gallop and no friction rub.   No murmur heard. Pulmonary/Chest: Effort normal and breath sounds normal. No respiratory distress. She has no wheezes. She has no rales.  Abdominal: Soft. Bowel sounds are normal. She exhibits no distension and no mass. There is tenderness ( Epigastric tenderness with mild guarding, no other abdominal  tenderness, non-peritoneal).  Musculoskeletal: Normal range of motion. She exhibits no edema and no tenderness.  Lymphadenopathy:    She has no cervical adenopathy.  Neurological: She is alert. Coordination normal.  Skin: Skin is warm and dry. No rash noted. No erythema.  Psychiatric: She has a normal mood and affect. Her behavior is normal.    ED Course  Procedures (including critical care time)  Labs Reviewed  URINALYSIS, ROUTINE W REFLEX MICROSCOPIC - Abnormal; Notable for the following:    APPearance CLOUDY (*)    All other components within normal limits  COMPREHENSIVE METABOLIC PANEL - Abnormal; Notable for the following:    AST 296 (*)    ALT 182 (*)    Total Bilirubin 0.2 (*)    All other components within normal limits  LIPASE, BLOOD - Abnormal; Notable for the following:    Lipase 1024 (*)    All other components within normal limits  CBC  POCT PREGNANCY, URINE   No results found.   1. Pancreatitis   2. Transaminitis       MDM  The patient has normal vital signs but does have a history of chronic pancreatitis and on results today shows that she has developed a mild transaminitis with an AST of 300 ALT of close to 200 and a lipase over 1000. This is consistent with acute pancreatitis and I described to the patient my concern and indications for admission to the hospital. She has overtly refused admission to the hospital but does agree to followup closely as an outpatient. Again I described to her the need for close followup and she states that she does not have an appointment with the family doctor in order she have a family doctor that she is able to see immediately. She again refuses to stay in the hospital. She will return should her symptoms worsen, intramuscular hydromorphone and Benadryl given prior to discharge.  Discharge Prescriptions include:  Percocet         Vida Roller, MD 11/29/11 [phone]

## 2011-11-29 NOTE — ED Notes (Signed)
C/o abdominal pain and headache that has increased since being seen last night.

## 2011-11-29 NOTE — ED Notes (Addendum)
Pt presents to ED with c/o upper and left sided abdominal pain. Pt has hx of pancreatitis and pain is chronic. Pt states she had ran out of her pain meds at home and pain is increasing. Pt also c/o of migraine that began 2 days ago. Dr. Hyacinth Meeker advised pt to be admitted for observation but pt insisted that she would rather be discharged with pain meds alogn with follow up apt and did not want to be admitted.

## 2011-11-29 NOTE — ED Notes (Signed)
Pt presents with no acute distress.  Pt reports HA started yesterday and stomach pain for three weeks denies urinary problems

## 2011-12-04 NOTE — ED Provider Notes (Signed)
Medical screening examination/treatment/procedure(s) were performed by non-physician practitioner and as supervising physician I was immediately available for consultation/collaboration.  Karmine Kauer, MD 12/04/11 0926 

## 2011-12-06 ENCOUNTER — Encounter (HOSPITAL_COMMUNITY): Payer: Self-pay | Admitting: *Deleted

## 2011-12-06 ENCOUNTER — Emergency Department (HOSPITAL_COMMUNITY)
Admission: EM | Admit: 2011-12-06 | Discharge: 2011-12-06 | Disposition: A | Discharge status: Home / Self Care | Payer: Medicaid Other | Attending: Emergency Medicine | Admitting: Emergency Medicine

## 2011-12-06 DIAGNOSIS — G8929 Other chronic pain: Secondary | ICD-10-CM | POA: Insufficient documentation

## 2011-12-06 DIAGNOSIS — R109 Unspecified abdominal pain: Secondary | ICD-10-CM | POA: Insufficient documentation

## 2011-12-06 DIAGNOSIS — K861 Other chronic pancreatitis: Secondary | ICD-10-CM | POA: Insufficient documentation

## 2011-12-06 DIAGNOSIS — F17200 Nicotine dependence, unspecified, uncomplicated: Secondary | ICD-10-CM | POA: Insufficient documentation

## 2011-12-06 LAB — CBC
HCT: 35.8 % — ABNORMAL LOW (ref 36.0–46.0)
Hemoglobin: 12.3 g/dL (ref 12.0–15.0)
MCV: 79.7 fL (ref 78.0–100.0)
RBC: 4.49 MIL/uL (ref 3.87–5.11)
RDW: 12 % (ref 11.5–15.5)
WBC: 5.7 10*3/uL (ref 4.0–10.5)

## 2011-12-06 LAB — DIFFERENTIAL
Eosinophils Relative: 5 % (ref 0–5)
Lymphocytes Relative: 47 % — ABNORMAL HIGH (ref 12–46)
Lymphs Abs: 2.7 10*3/uL (ref 0.7–4.0)
Monocytes Absolute: 0.5 10*3/uL (ref 0.1–1.0)
Monocytes Relative: 9 % (ref 3–12)
Neutro Abs: 2.1 10*3/uL (ref 1.7–7.7)

## 2011-12-06 LAB — COMPREHENSIVE METABOLIC PANEL
AST: 19 U/L (ref 0–37)
BUN: 12 mg/dL (ref 6–23)
CO2: 23 mEq/L (ref 19–32)
Calcium: 9 mg/dL (ref 8.4–10.5)
Chloride: 103 mEq/L (ref 96–112)
Creatinine, Ser: 0.63 mg/dL (ref 0.50–1.10)
GFR calc Af Amer: >90 mL/min (ref >90)
GFR calc non Af Amer: >90 mL/min (ref >90)
Glucose, Bld: 96 mg/dL (ref 70–99)
Total Bilirubin: 0.2 mg/dL — ABNORMAL LOW (ref 0.3–1.2)

## 2011-12-06 LAB — URINALYSIS, ROUTINE W REFLEX MICROSCOPIC
Bilirubin Urine: NEGATIVE
Hgb urine dipstick: NEGATIVE
Nitrite: NEGATIVE
Protein, ur: NEGATIVE mg/dL
Specific Gravity, Urine: 1.029 (ref 1.005–1.030)
Urobilinogen, UA: 0.2 mg/dL (ref 0.0–1.0)

## 2011-12-06 LAB — LIPASE, BLOOD: Lipase: 80 U/L — ABNORMAL HIGH (ref 11–59)

## 2011-12-06 MED ORDER — SODIUM CHLORIDE 0.9 % IV BOLUS (SEPSIS)
2000.0000 mL | Freq: Once | INTRAVENOUS | Status: AC
  Administered 2011-12-06: 1000 mL via INTRAVENOUS

## 2011-12-06 MED ORDER — OXYCODONE-ACETAMINOPHEN 5-325 MG PO TABS: 1.0000 | ORAL_TABLET | ORAL | Status: AC | PRN

## 2011-12-06 MED ORDER — SODIUM CHLORIDE 0.9 % IV SOLN: INTRAVENOUS | Status: DC

## 2011-12-06 MED ORDER — HYDROMORPHONE HCL PF 1 MG/ML IJ SOLN
0.5000 mg | Freq: Once | INTRAMUSCULAR | Status: AC
  Administered 2011-12-06: 0.5 mg via INTRAVENOUS
  Filled 2011-12-06: qty 1

## 2011-12-06 MED ORDER — DIPHENHYDRAMINE HCL 50 MG/ML IJ SOLN
25.0000 mg | Freq: Once | INTRAMUSCULAR | Status: AC
  Administered 2011-12-06: 11:00:00 via INTRAVENOUS
  Filled 2011-12-06: qty 1

## 2011-12-06 MED ORDER — METOCLOPRAMIDE HCL 5 MG/ML IJ SOLN
10.0000 mg | Freq: Once | INTRAMUSCULAR | Status: AC
  Administered 2011-12-06: 10 mg via INTRAVENOUS
  Filled 2011-12-06: qty 2

## 2011-12-06 NOTE — ED Notes (Signed)
Pt requesting to go home.

## 2011-12-06 NOTE — ED Notes (Signed)
Pt caring for infant child at bedside. Pt questioned when another care taker for infant would be here so that pain RX can be given due to medications have effects of ALOC, and may impair pts ability to care for child and endanger it. Pt given phone to call cousin for estimated time of arrival. Bolus started.

## 2011-12-06 NOTE — ED Provider Notes (Signed)
History     CSN: [id number]  Arrival date & time 12/06/11  0801   First MD Initiated Contact with Patient 12/06/11 0813      Chief Complaint  Patient presents with  . Abdominal Pain    (Consider location/radiation/quality/duration/timing/severity/associated sxs/prior treatment) HPI This 32 year old female has had multiple emergency room visits for chronic abdominal pain and vomiting, she has been on narcotics for months if not years and is a 65 emergency room visits in the last 6 months mostly for the same problem.  She did actually have a recent flareup of elevated lipase and the transaminases and has received IV fluids and IV narcotics again in the emergency department with a recommendation for admission, the patient for discharge and states she actually has been pain free without nausea or vomiting for several days since last ED visit, she states overnight she flare up again if her pain in the epigastrium right upper quadrant right lower quadrant with several nonbloody vomiting spells with no bloody stools no dysuria no chest pain cough shortness of breath. This is a typical flareup of pain for her. She feels that she needs IV Dilaudid IV Benadryl IV Reglan and IV fluids again. Past Medical History  Diagnosis Date  . Pancreatitis   . S/P laparoscopic cholecystectomy   . Fibroids   . Ovarian cyst   . Chronic abdominal pain   . Anemia     Past Surgical History  Procedure Date  . Cholecystectomy   . Abdominal hysterectomy   . Dilation and curettage of uterus     Family History  Problem Relation Age of Onset  . Diabetes Mother   . Hypertension Mother   . Hypertension Father   . Diabetes Father   . Asthma Daughter     History  Substance Use Topics  . Smoking status: Current Everyday Smoker -- 0.5 packs/day for 10 years    Types: Cigarettes  . Smokeless tobacco: Never Used   Comment: declined  . Alcohol Use: No    OB History    Grav Para Term Preterm Abortions TAB SAB  Ect Mult Living   4 2 1 1 2  2          Review of Systems  Constitutional: Negative for fever.       10 Systems reviewed and are negative for acute change except as noted in the HPI.  HENT: Negative for congestion.   Eyes: Negative for discharge and redness.  Respiratory: Negative for cough and shortness of breath.   Cardiovascular: Negative for chest pain.  Gastrointestinal: Positive for nausea, vomiting and abdominal pain. Negative for diarrhea.  Genitourinary: Negative for dysuria, vaginal bleeding and vaginal discharge.  Musculoskeletal: Negative for back pain.  Skin: Negative for rash.  Neurological: Negative for syncope, numbness and headaches.  Psychiatric/Behavioral:       No behavior change.    Allergies  Celecoxib; Ketorolac tromethamine; Meperidine hcl; Coconut flavor; Fentanyl; Fish allergy; Ibuprofen; Morphine; Propoxyphene-acetaminophen; and Tramadol hcl  Home Medications   Current Outpatient Rx  Name Route Sig Dispense Refill  . DIPHENHYDRAMINE HCL 50 MG PO CAPS Oral Take 50 mg by mouth every 6 (six) hours as needed. For itching    . ESTRADIOL 2 MG PO TABS Oral Take 2 mg by mouth at bedtime.    . OXYCODONE-ACETAMINOPHEN 5-325 MG PO TABS Oral Take 1 tablet by mouth every 4 (four) hours as needed for pain. 4 tablet 0    BP 111/74  Pulse 73  Temp(Src)  97.9 F (36.6 C) (Oral)  Resp 18  SpO2 100%  LMP 06/01/2011  Physical Exam  Nursing note and vitals reviewed. Constitutional:       Awake, alert, nontoxic appearance.  HENT:  Head: Atraumatic.  Eyes: Right eye exhibits no discharge. Left eye exhibits no discharge.  Neck: Neck supple.  Cardiovascular: Normal rate and regular rhythm.   No murmur heard. Pulmonary/Chest: Effort normal and breath sounds normal. No respiratory distress. She has no wheezes. She has no rales. She exhibits no tenderness.  Abdominal: Soft. Bowel sounds are normal. She exhibits no mass. There is tenderness. There is no rebound and no  guarding.       Mild tenderness to the epigastrium, right upper quadrant, right lower quadrant without rebound and no CVA tenderness  Musculoskeletal: She exhibits no edema and no tenderness.       Baseline ROM, no obvious new focal weakness.  Neurological: She is alert.       Mental status and motor strength appears baseline for patient and situation.  Skin: No rash noted.  Psychiatric: She has a normal mood and affect.    ED Course  Procedures (including critical care time)  Medical screening examination/treatment/procedure(s) were conducted as a shared visit with non-physician practitioner(s) and myself.  I personally evaluated the patient during the encounter. Plan move to CDU labs pnd.0830 Labs Reviewed  LIPASE, BLOOD - Abnormal; Notable for the following:    Lipase 80 (*)    All other components within normal limits  CBC - Abnormal; Notable for the following:    HCT 35.8 (*)    All other components within normal limits  DIFFERENTIAL - Abnormal; Notable for the following:    Neutrophils Relative 37 (*)    Lymphocytes Relative 47 (*)    All other components within normal limits  COMPREHENSIVE METABOLIC PANEL - Abnormal; Notable for the following:    ALT 39 (*)    Total Bilirubin 0.2 (*)    All other components within normal limits  URINALYSIS, ROUTINE W REFLEX MICROSCOPIC - Abnormal; Notable for the following:    APPearance CLOUDY (*)    All other components within normal limits  POCT PREGNANCY, URINE   No results found.   1. Chronic abdominal pain       MDM          Hurman Horn, MD 12/06/11 2339

## 2011-12-06 NOTE — ED Notes (Signed)
Attempted IV start x's 2 unsuccessfully. Pt refused IV team to be called and requested myself stick her a 3rd time, I refused request and had Lillia Abed RN assess for IV access.

## 2011-12-06 NOTE — ED Notes (Signed)
Pt d/c home in NAD. PT voiced understanding of d/c instructions as well as follow up care. Pt instructed not to drive after taking narcotics. Pt ambulated with quick steady gait. Pt cousin to drive pt and baby home

## 2011-12-06 NOTE — ED Notes (Signed)
Provider notified that pt is requesting to go home and requesting a prescription for pain meds

## 2011-12-06 NOTE — ED Provider Notes (Signed)
12:04 PM Patient reports symptoms have improved but requests a little extra pain medication as well as prescription for home. Labs do not indicate that she is having pancreatic flareup. Will give 0.5 mg Dilaudid and a prescription for 4 Percocet. Advised followup with primary care physician today for further pain control as needed. Patient voices understanding and is ready for discharge  [name], [name] 12/06/11 1251

## 2011-12-06 NOTE — ED Notes (Signed)
Pt reports "my's enzymes is elevated, it's my pancreas actin up". LUQ pain onset 4wks ago

## 2011-12-06 NOTE — Discharge Instructions (Signed)
Please followup with primary care physician for further evaluation.

## 2011-12-11 ENCOUNTER — Emergency Department (HOSPITAL_COMMUNITY)
Admission: EM | Admit: 2011-12-11 | Discharge: 2011-12-12 | Disposition: A | Discharge status: Home / Self Care | Payer: Medicaid Other | Attending: Emergency Medicine | Admitting: Emergency Medicine

## 2011-12-11 ENCOUNTER — Encounter (HOSPITAL_COMMUNITY): Payer: Self-pay | Admitting: Emergency Medicine

## 2011-12-11 DIAGNOSIS — R10816 Epigastric abdominal tenderness: Secondary | ICD-10-CM | POA: Insufficient documentation

## 2011-12-11 DIAGNOSIS — R109 Unspecified abdominal pain: Secondary | ICD-10-CM | POA: Insufficient documentation

## 2011-12-11 DIAGNOSIS — Z9089 Acquired absence of other organs: Secondary | ICD-10-CM | POA: Insufficient documentation

## 2011-12-11 DIAGNOSIS — G8929 Other chronic pain: Secondary | ICD-10-CM | POA: Insufficient documentation

## 2011-12-11 LAB — PREGNANCY, URINE: Preg Test, Ur: NEGATIVE

## 2011-12-11 NOTE — ED Notes (Addendum)
Pt c/o abdominal mid and RLQ pain that started 4 weeks ago. Hx of pancreatitis for 9 years, hysterectomy, and anemia. Denies alcohol and drug intake. Pt reports vomiting yellow fluid "like spoiled milk" and diarrhea. Unable to keep anything down.

## 2011-12-12 LAB — COMPREHENSIVE METABOLIC PANEL
AST: 18 U/L (ref 0–37)
Albumin: 3.7 g/dL (ref 3.5–5.2)
BUN: 19 mg/dL (ref 6–23)
CO2: 23 mEq/L (ref 19–32)
Calcium: 8.9 mg/dL (ref 8.4–10.5)
Chloride: 105 mEq/L (ref 96–112)
Creatinine, Ser: 0.6 mg/dL (ref 0.50–1.10)
GFR calc non Af Amer: >90 mL/min (ref >90)
Total Bilirubin: 0.1 mg/dL — ABNORMAL LOW (ref 0.3–1.2)

## 2011-12-12 LAB — LIPASE, BLOOD: Lipase: 75 U/L — ABNORMAL HIGH (ref 11–59)

## 2011-12-12 LAB — CBC
HCT: 36.3 % (ref 36.0–46.0)
MCH: 26.3 pg (ref 26.0–34.0)
MCV: 80.1 fL (ref 78.0–100.0)
Platelets: 272 10*3/uL (ref 150–400)
RDW: 11.9 % (ref 11.5–15.5)

## 2011-12-12 LAB — URINALYSIS, ROUTINE W REFLEX MICROSCOPIC
Bilirubin Urine: NEGATIVE
Ketones, ur: NEGATIVE mg/dL
Nitrite: NEGATIVE
Urobilinogen, UA: 1 mg/dL (ref 0.0–1.0)
pH: 7.5 (ref 5.0–8.0)

## 2011-12-12 MED ORDER — OXYCODONE-ACETAMINOPHEN 5-325 MG PO TABS
1.0000 | ORAL_TABLET | Freq: Once | ORAL | Status: AC
  Administered 2011-12-12: 1 via ORAL
  Filled 2011-12-12: qty 1

## 2011-12-12 MED ORDER — SODIUM CHLORIDE 0.9 % IV BOLUS (SEPSIS)
1000.0000 mL | Freq: Once | INTRAVENOUS | Status: AC
  Administered 2011-12-12: 1000 mL via INTRAVENOUS

## 2011-12-12 MED ORDER — DIPHENHYDRAMINE HCL 25 MG PO CAPS
25.0000 mg | ORAL_CAPSULE | Freq: Once | ORAL | Status: AC
  Administered 2011-12-12: 25 mg via ORAL
  Filled 2011-12-12: qty 1

## 2011-12-12 MED ORDER — HYDROMORPHONE HCL PF 1 MG/ML IJ SOLN
1.0000 mg | Freq: Once | INTRAMUSCULAR | Status: AC
  Administered 2011-12-12: 1 mg via INTRAVENOUS
  Filled 2011-12-12: qty 1

## 2011-12-12 NOTE — Discharge Instructions (Signed)

## 2011-12-12 NOTE — ED Provider Notes (Signed)
History     CSN: [id number]  Arrival date & time 12/11/11  2043   First MD Initiated Contact with Patient 12/11/11 2259      Chief Complaint  Patient presents with  . Abdominal Pain     The history is provided by the patient and medical records.   the patient reports worsening of her abdominal pain for the past 4 days.  She's had nausea and several episodes of vomiting.  She denies diarrhea.  She reports decreased oral intake over the past several days.  She has a long-standing history of upper abdominal pain and has had surgery in seen several gastroenterologists without definitive cause for her abdominal pain noted.  She does have a history of pancreatitis.  She denies fevers or chills.  She denies dysuria or urinary frequency.  She has no vaginal complaints.  Nothing worsens her symptoms.  Nothing improves her symptoms.  Symptoms are constant.  Past Medical History  Diagnosis Date  . Pancreatitis   . S/P laparoscopic cholecystectomy   . Fibroids   . Ovarian cyst   . Chronic abdominal pain   . Anemia     Past Surgical History  Procedure Date  . Cholecystectomy   . Abdominal hysterectomy   . Dilation and curettage of uterus     Family History  Problem Relation Age of Onset  . Diabetes Mother   . Hypertension Mother   . Hypertension Father   . Diabetes Father   . Asthma Daughter     History  Substance Use Topics  . Smoking status: Current Everyday Smoker -- 0.5 packs/day for 10 years    Types: Cigarettes  . Smokeless tobacco: Never Used   Comment: declined  . Alcohol Use: No    OB History    Grav Para Term Preterm Abortions TAB SAB Ect Mult Living   4 2 1 1 2  2          Review of Systems  Gastrointestinal: Positive for abdominal pain.  All other systems reviewed and are negative.    Allergies  Celecoxib; Ketorolac tromethamine; Meperidine hcl; Coconut flavor; Fentanyl; Fish allergy; Ibuprofen; Morphine; Propoxyphene-acetaminophen; and Tramadol  hcl  Home Medications   Current Outpatient Rx  Name Route Sig Dispense Refill  . DIPHENHYDRAMINE HCL 50 MG PO CAPS Oral Take 50 mg by mouth every 6 (six) hours as needed. For itching    . ESTRADIOL 2 MG PO TABS Oral Take 2 mg by mouth at bedtime.    . OXYCODONE-ACETAMINOPHEN 5-325 MG PO TABS Oral Take 1 tablet by mouth every 4 (four) hours as needed for pain. 4 tablet 0    BP 129/87  Pulse 97  Temp(Src) 97.8 F (36.6 C) (Oral)  Resp 20  SpO2 98%  LMP 06/01/2011  Physical Exam  Nursing note and vitals reviewed. Constitutional: She is oriented to person, place, and time. She appears well-developed and well-nourished. No distress.  HENT:  Head: Normocephalic and atraumatic.  Eyes: EOM are normal.  Neck: Normal range of motion.  Cardiovascular: Normal rate, regular rhythm and normal heart sounds.   Pulmonary/Chest: Effort normal and breath sounds normal.  Abdominal: Soft. She exhibits no distension.       Mild epigastric tenderness without guarding or rebound  Musculoskeletal: Normal range of motion.  Neurological: She is alert and oriented to person, place, and time.  Skin: Skin is warm and dry.  Psychiatric: She has a normal mood and affect. Judgment normal.    ED Course  Procedures (including critical care time)  Labs Reviewed  CBC - Abnormal; Notable for the following:    Hemoglobin 11.9 (*)    All other components within normal limits  COMPREHENSIVE METABOLIC PANEL - Abnormal; Notable for the following:    Total Bilirubin 0.1 (*)    All other components within normal limits  LIPASE, BLOOD - Abnormal; Notable for the following:    Lipase 75 (*)    All other components within normal limits  URINALYSIS, ROUTINE W REFLEX MICROSCOPIC - Abnormal; Notable for the following:    APPearance TURBID (*)    Specific Gravity, Urine 1.032 (*)    Hgb urine dipstick LARGE (*)    Protein, ur 30 (*)    Leukocytes, UA LARGE (*)    All other components within normal limits  URINE  MICROSCOPIC-ADD ON - Abnormal; Notable for the following:    Bacteria, UA MANY (*)    All other components within normal limits  PREGNANCY, URINE   No results found.   1. Chronic abdominal pain       MDM  Well-appearing.  She is feeding her child at this time.  She has a long-standing history of chronic abdominal pain.  I've seen in the emergency department multiple times there will check basic labs to pain at this time.  At this time she does not require any imaging        Lyanne Co, MD 12/12/11 [phone]

## 2011-12-14 ENCOUNTER — Encounter (HOSPITAL_COMMUNITY): Payer: Self-pay | Admitting: *Deleted

## 2011-12-14 ENCOUNTER — Emergency Department (HOSPITAL_COMMUNITY)
Admission: EM | Admit: 2011-12-14 | Discharge: 2011-12-15 | Disposition: A | Discharge status: Home / Self Care | Payer: Medicaid Other | Attending: Emergency Medicine | Admitting: Emergency Medicine

## 2011-12-14 DIAGNOSIS — R52 Pain, unspecified: Secondary | ICD-10-CM | POA: Insufficient documentation

## 2011-12-14 DIAGNOSIS — R10819 Abdominal tenderness, unspecified site: Secondary | ICD-10-CM | POA: Insufficient documentation

## 2011-12-14 DIAGNOSIS — R109 Unspecified abdominal pain: Secondary | ICD-10-CM

## 2011-12-14 DIAGNOSIS — R1031 Right lower quadrant pain: Secondary | ICD-10-CM | POA: Insufficient documentation

## 2011-12-14 DIAGNOSIS — Z9089 Acquired absence of other organs: Secondary | ICD-10-CM | POA: Insufficient documentation

## 2011-12-14 DIAGNOSIS — Z90710 Acquired absence of both cervix and uterus: Secondary | ICD-10-CM | POA: Insufficient documentation

## 2011-12-14 DIAGNOSIS — R112 Nausea with vomiting, unspecified: Secondary | ICD-10-CM | POA: Insufficient documentation

## 2011-12-14 LAB — CBC
HCT: 35.7 % — ABNORMAL LOW (ref 36.0–46.0)
Hemoglobin: 12 g/dL (ref 12.0–15.0)
MCH: 26.6 pg (ref 26.0–34.0)
MCV: 79.2 fL (ref 78.0–100.0)
Platelets: 269 10*3/uL (ref 150–400)
RBC: 4.51 MIL/uL (ref 3.87–5.11)
WBC: 7.6 10*3/uL (ref 4.0–10.5)

## 2011-12-14 NOTE — ED Notes (Signed)
Pt with hx of pancreatitis states this is a different abd pain.  Pain began around 0700 this am and is located in RLQ.  Pt c/o vomiting and diarrhea multiple times today with chills.  Presently afebrile.

## 2011-12-15 ENCOUNTER — Emergency Department (HOSPITAL_COMMUNITY): Payer: Medicaid Other

## 2011-12-15 LAB — POCT I-STAT, CHEM 8
BUN: 16 mg/dL (ref 6–23)
Chloride: 107 mEq/L (ref 96–112)
Creatinine, Ser: 0.5 mg/dL (ref 0.50–1.10)
Hemoglobin: 12.9 g/dL (ref 12.0–15.0)
Potassium: 3.7 mEq/L (ref 3.5–5.1)
Sodium: 141 mEq/L (ref 135–145)

## 2011-12-15 LAB — URINALYSIS, ROUTINE W REFLEX MICROSCOPIC
Bilirubin Urine: NEGATIVE
Glucose, UA: NEGATIVE mg/dL
Hgb urine dipstick: NEGATIVE
Specific Gravity, Urine: 1.035 — ABNORMAL HIGH (ref 1.005–1.030)
pH: 6 (ref 5.0–8.0)

## 2011-12-15 MED ORDER — HYDROMORPHONE HCL PF 1 MG/ML IJ SOLN
1.0000 mg | Freq: Once | INTRAMUSCULAR | Status: AC
  Administered 2011-12-15: 1 mg via INTRAVENOUS
  Filled 2011-12-15: qty 1

## 2011-12-15 MED ORDER — IOHEXOL 300 MG/ML  SOLN
80.0000 mL | Freq: Once | INTRAMUSCULAR | Status: AC | PRN
  Administered 2011-12-15: 80 mL via INTRAVENOUS

## 2011-12-15 MED ORDER — IOHEXOL 300 MG/ML  SOLN
20.0000 mL | INTRAMUSCULAR | Status: AC
  Administered 2011-12-15: 20 mL via ORAL

## 2011-12-15 MED ORDER — ONDANSETRON 4 MG PO TBDP
ORAL_TABLET | ORAL | Status: AC
  Administered 2011-12-15: 4 mg
  Filled 2011-12-15: qty 1

## 2011-12-15 NOTE — ED Notes (Signed)
Attempted to establish iv, unsuccessful. IV team called for iv access, plan of care updated with verbal understanding.

## 2011-12-15 NOTE — ED Notes (Signed)
MD at bedside for pt re-evaluation.

## 2011-12-15 NOTE — ED Notes (Signed)
Pt stated that she is still in pain but is dozing off. Skin is warm and dry, respiration is even and unlabored.

## 2011-12-15 NOTE — ED Provider Notes (Signed)
History     CSN: [id number]  Arrival date & time 12/14/11  2257   First MD Initiated Contact with Patient 12/15/11 0238      Chief Complaint  Patient presents with  . Abdominal Pain    RLQ    (Consider location/radiation/quality/duration/timing/severity/associated sxs/prior treatment) HPI Comments: 31 year old female with a history of chronic abdominal pain, chronic pancreatitis who presents with lower abdominal pain. She was seen 2 days ago for similar symptoms, no specific etiology of her pain was found and she has been at home doing well until yesterday. Approximately 20 hours ago she developed a lower abdominal pain in the suprapubic and right lower quadrant which has been persistent. She denies fevers or chills but has had some vomiting and diarrhea multiple times today. There has been no back pain, no dysuria, no flank pain, Tylenol without relief. Currently symptoms are moderate. Worse with palpation. She states this is different than her chronic pancreatitis in the past and denies any alcohol use. She has had a cholecystectomy in the past  Patient is a 31 y.o. female presenting with abdominal pain. The history is provided by the patient and medical records.  Abdominal Pain The primary symptoms of the illness include abdominal pain.    Past Medical History  Diagnosis Date  . Pancreatitis   . S/P laparoscopic cholecystectomy   . Fibroids   . Ovarian cyst   . Chronic abdominal pain   . Anemia     Past Surgical History  Procedure Date  . Cholecystectomy   . Abdominal hysterectomy   . Dilation and curettage of uterus     Family History  Problem Relation Age of Onset  . Diabetes Mother   . Hypertension Mother   . Hypertension Father   . Diabetes Father   . Asthma Daughter     History  Substance Use Topics  . Smoking status: Current Everyday Smoker -- 0.5 packs/day for 10 years    Types: Cigarettes  . Smokeless tobacco: Never Used   Comment: declined  . Alcohol  Use: No    OB History    Grav Para Term Preterm Abortions TAB SAB Ect Mult Living   4 2 1 1 2  2          Review of Systems  Gastrointestinal: Positive for abdominal pain.  All other systems reviewed and are negative.    Allergies  Celecoxib; Ketorolac tromethamine; Meperidine hcl; Coconut flavor; Fentanyl; Fish allergy; Ibuprofen; Morphine; Propoxyphene-acetaminophen; and Tramadol hcl  Home Medications   Current Outpatient Rx  Name Route Sig Dispense Refill  . DIPHENHYDRAMINE HCL 50 MG PO CAPS Oral Take 50 mg by mouth every 6 (six) hours as needed. For itching    . ESTRADIOL 2 MG PO TABS Oral Take 2 mg by mouth at bedtime.    . OXYCODONE-ACETAMINOPHEN 5-325 MG PO TABS Oral Take 1 tablet by mouth every 4 (four) hours as needed for pain. 4 tablet 0    BP 105/71  Pulse 61  Temp 97.7 F (36.5 C) (Oral)  Resp 15  SpO2 95%  LMP 06/01/2011  Physical Exam  Nursing note and vitals reviewed. Constitutional: She appears well-developed and well-nourished. No distress.  HENT:  Head: Normocephalic and atraumatic.  Mouth/Throat: Oropharynx is clear and moist. No oropharyngeal exudate.  Eyes: Conjunctivae and EOM are normal. Pupils are equal, round, and reactive to light. Right eye exhibits no discharge. Left eye exhibits no discharge. No scleral icterus.  Neck: Normal range of motion. Neck supple.  No JVD present. No thyromegaly present.  Cardiovascular: Normal rate, regular rhythm, normal heart sounds and intact distal pulses.  Exam reveals no gallop and no friction rub.   No murmur heard. Pulmonary/Chest: Effort normal and breath sounds normal. No respiratory distress. She has no wheezes. She has no rales.  Abdominal: Soft. Bowel sounds are normal. She exhibits no distension and no mass. There is tenderness ( Focal tenderness in the suprapubic and right lower quadrant. There is no upper abdominal tenderness, no Rovsing sign, non-peritoneal, no guarding.).       No CVA tenderness    Musculoskeletal: Normal range of motion. She exhibits no edema and no tenderness.  Lymphadenopathy:    She has no cervical adenopathy.  Neurological: She is alert. Coordination normal.  Skin: Skin is warm and dry. No rash noted. No erythema.  Psychiatric: She has a normal mood and affect. Her behavior is normal.    ED Course  Procedures (including critical care time)  Labs Reviewed  URINALYSIS, ROUTINE W REFLEX MICROSCOPIC - Abnormal; Notable for the following:    APPearance HAZY (*)     Specific Gravity, Urine 1.035 (*)     All other components within normal limits  CBC - Abnormal; Notable for the following:    HCT 35.7 (*)     All other components within normal limits  POCT I-STAT TROPONIN I  POCT I-STAT, CHEM 8   Ct Abdomen Pelvis W Contrast  12/15/2011  *RADIOLOGY REPORT*  Clinical Data: Right lower quadrant pain, nausea/vomiting  CT ABDOMEN AND PELVIS WITH CONTRAST  Technique:  Multidetector CT imaging of the abdomen and pelvis was performed following the standard protocol during bolus administration of intravenous contrast.  Contrast: 80mL OMNIPAQUE IOHEXOL 300 MG/ML  SOLN  Comparison: 09/06/2011  Findings: Minimal dependent atelectasis at the lung bases.  Liver, spleen, and adrenal glands are within normal limits.  No peripancreatic inflammatory changes by CT.  Status post cholecystectomy. Stable mild intrahepatic/extrahepatic ductal dilatation, likely postsurgical.  Stable 14 x 11 mm right renal cyst.  Left kidney is within normal limits.  No hydronephrosis.  No evidence of bowel obstruction.  Appendix is not discretely visualized but there are no inflammatory changes in the right lower quadrant.  No evidence of abdominal aortic aneurysm.  No abdominopelvic ascites.  No suspicious abdominopelvic lymphadenopathy.  Status post hysterectomy.  No adnexal masses.  Bladder is within normal limits.  Visualized osseous structures are within normal limits.  IMPRESSION: Appendix is not  discretely visualized.  No inflammatory changes in the right lower quadrant to suggest acute appendicitis.  No evidence of bowel obstruction.  No peripancreatic inflammatory changes by CT.  No CT findings to account for the patient's abdominal pain.  Original Report Authenticated By: Charline Bills, M.D.     1. Abdominal pain, acute       MDM  Vital signs are normal, there is no leukocytosis, abdomen is soft but has tenderness in the suprapubic and right lower quadrant areas which could be consistent with several different etiologies including which would be a urine infection. Review of the prior visit shows that there was some bacteria and white blood cells seen, I have requested that the patient get a repeat sample, patient is ambulatory without difficulty.  Urinalysis is clean at this time, no signs of infection. I have reviewed your blood work, there is no leukocytosis however she has persistent right lower quadrant tenderness on exam. Because this is vastly different than her classic upper abdominal pain related  to pancreatitis and the fact that she still has an appendix, we'll perform a CT scan to rule out acute appendicitis. Intravenous opiate medications given for pain at this time. Patient has allergy to NSAIDs.   CT scan completely negative, patient given reassurance and instructions to followup as needed. Essentially the patient has had essentially 24 hours of ongoing right lower quadrant pain, normal white blood cell count and a CT scan which does not show inflammatory changes or acute appendicitis. No narcotics given the patient on discharge    Vida Roller, MD 12/15/11 [phone]

## 2011-12-15 NOTE — Discharge Instructions (Signed)
Your testing is all completely normal, there is no signs of appendicitis, your blood work is normal, your urine test does not show infection. Please return to see your doctor as needed, return to the hospital for severe or worsening symptoms.  You have been diagnosed with undifferentiated abdominal pain.  Abdominal pain can be caused by many things. Your caregiver evaluates the seriousness of your pain by an examination and possibly blood or urine tests and imaging (CT scan, x-rays, ultrasound). Many cases can be observed and treated at home after initial evaluation in the emergency department. Even though you are being discharged home, abdominal pain can be unpredictable. Therefore, you need a repeat exam if your pain does not resolve, returns, or worsens. Most patient's with abdominal pain do not need to be admitted to the hospital or have surgery, but serious problems like appendicitis and gallbladder attacks can start out as nonspecific pain. Many abdominal conditions cannot be diagnosed in 1 visit, so followup evaluations are very important.  Seek immediate medical attention if:  *The pain does not go away or becomes severe. *Temperature above 101 develops *Repeated vomiting occurs(multiple episodes) *The pain becomes localized to portions of the abdomen. The right side could possibly be appendicitis. In an adult, the left lower portion of the abdomen could be colitis or diverticulitis. *Blood is being passed in stools or vomit *Return also if you develop chest pain, difficulty breathing, dizziness or fainting, or become confused poorly responsive or inconsolable (young children).     If you do not have a physician, you should reference the below phone numbers and call in the morning to establish follow up care.  RESOURCE GUIDE  Dental Problems  Patients with Medicaid: [facility] [phone] [address]                                            [phone] [address] Phone:  [phone]                                                  Phone:  [phone]  If unable to pay or uninsured, contact:  [facility] or [facility]. to become qualified for the adult dental clinic.  Chronic Pain Problems Contact [facility]  [phone] Patients need to be referred by their primary care doctor.  Insufficient Money for Medicine Contact [employer]:  call "211" or [facility] [phone].  No Primary Care Doctor Call [facility]  [phone] Other agencies that provide inexpensive medical care    [facility]  [phone]    [facility]  [phone]    [facility]  [phone]    [facility]  [phone]    [facility]  [phone]    [facility]  [phone]  Psychological Services [facility]  [phone] [facility]  [phone] [facility]   [phone] (emergency services [phone])  Substance Abuse Resources [facility]  [phone] [facility] [phone] [facility] [phone] Floydene Flock [phone] Residential & Outpatient Substance Abuse  Program  [phone]  Abuse/Neglect [facility] Child Abuse Hotline [phone] [facility] Child Abuse Hotline 4348014411 (After Hours)  Emergency Shelter [facility] [phone]  Maternity Homes Room at the [facility] [phone] [facility] Services [phone]  MRSA Hotline #:   226-077-8660    [facility] Resources  [facility]     [employer]                          [facility] Dept. [address] [place]                       [address]      [address]  Blondell Reveal Phone:  [phone]                                    Phone:  [phone]                 Phone:  [phone]  [facility] Phone:  [phone]  [facility] Child Abuse Hotline [phone] [phone] (After Hours)

## 2011-12-15 NOTE — ED Notes (Signed)
IV Team at bedside 

## 2011-12-20 ENCOUNTER — Encounter (HOSPITAL_COMMUNITY): Payer: Self-pay | Admitting: Emergency Medicine

## 2011-12-20 ENCOUNTER — Emergency Department (HOSPITAL_COMMUNITY)
Admission: EM | Admit: 2011-12-20 | Discharge: 2011-12-21 | Disposition: A | Discharge status: Home / Self Care | Payer: Medicaid Other | Attending: Emergency Medicine | Admitting: Emergency Medicine

## 2011-12-20 DIAGNOSIS — G8929 Other chronic pain: Secondary | ICD-10-CM | POA: Insufficient documentation

## 2011-12-20 DIAGNOSIS — R109 Unspecified abdominal pain: Secondary | ICD-10-CM | POA: Insufficient documentation

## 2011-12-20 DIAGNOSIS — D649 Anemia, unspecified: Secondary | ICD-10-CM | POA: Insufficient documentation

## 2011-12-20 DIAGNOSIS — F17200 Nicotine dependence, unspecified, uncomplicated: Secondary | ICD-10-CM | POA: Insufficient documentation

## 2011-12-20 LAB — CBC
HCT: 36.9 % (ref 36.0–46.0)
Hemoglobin: 12.4 g/dL (ref 12.0–15.0)
MCH: 26.9 pg (ref 26.0–34.0)
RBC: 4.61 MIL/uL (ref 3.87–5.11)

## 2011-12-20 LAB — DIFFERENTIAL
Eosinophils Absolute: 0.4 10*3/uL (ref 0.0–0.7)
Lymphs Abs: 3 10*3/uL (ref 0.7–4.0)
Monocytes Absolute: 0.4 10*3/uL (ref 0.1–1.0)
Monocytes Relative: 7 % (ref 3–12)
Neutro Abs: 2.4 10*3/uL (ref 1.7–7.7)
Neutrophils Relative %: 39 % — ABNORMAL LOW (ref 43–77)

## 2011-12-20 NOTE — ED Notes (Addendum)
PT reports epigastric pain, nausea, vomiting, diarrh. Reports her pancreatitis is back and her prescribed vicodin is not helping.

## 2011-12-21 LAB — URINALYSIS, ROUTINE W REFLEX MICROSCOPIC
Bilirubin Urine: NEGATIVE
Ketones, ur: NEGATIVE mg/dL
Nitrite: NEGATIVE
Protein, ur: NEGATIVE mg/dL
Urobilinogen, UA: 1 mg/dL (ref 0.0–1.0)

## 2011-12-21 LAB — COMPREHENSIVE METABOLIC PANEL
Alkaline Phosphatase: 68 U/L (ref 39–117)
BUN: 17 mg/dL (ref 6–23)
Chloride: 104 mEq/L (ref 96–112)
GFR calc Af Amer: >90 mL/min (ref >90)
Glucose, Bld: 104 mg/dL — ABNORMAL HIGH (ref 70–99)
Potassium: 3.2 mEq/L — ABNORMAL LOW (ref 3.5–5.1)
Total Bilirubin: 0.2 mg/dL — ABNORMAL LOW (ref 0.3–1.2)

## 2011-12-21 LAB — LIPASE, BLOOD: Lipase: 51 U/L (ref 11–59)

## 2011-12-21 MED ORDER — OXYCODONE-ACETAMINOPHEN 5-325 MG PO TABS
2.0000 | ORAL_TABLET | Freq: Once | ORAL | Status: AC
  Administered 2011-12-21: 2 via ORAL
  Filled 2011-12-21: qty 2

## 2011-12-21 NOTE — Discharge Instructions (Signed)
Abdominal (belly) pain can be caused by many things. Your caregiver performed an examination and possibly ordered blood/urine tests and imaging (CT scan, x-rays, ultrasound). Many cases can be observed and treated at home after initial evaluation in the emergency department. Even though you are being discharged home, abdominal pain can be unpredictable. Therefore, you need a repeated exam if your pain does not resolve, returns, or worsens. Most patients with abdominal pain don't have to be admitted to the hospital or have surgery, but serious problems like appendicitis and gallbladder attacks can start out as nonspecific pain. Many abdominal conditions cannot be diagnosed in one visit, so follow-up evaluations are very important. °SEEK IMMEDIATE MEDICAL ATTENTION IF: °The pain does not go away or becomes severe.  °A temperature above 101 develops.  °Repeated vomiting occurs (multiple episodes).  °The pain becomes localized to portions of the abdomen. The right side could possibly be appendicitis. In an adult, the left lower portion of the abdomen could be colitis or diverticulitis.  °Blood is being passed in stools or vomit (bright red or black tarry stools).  °Return also if you develop chest pain, difficulty breathing, dizziness or fainting, or become confused, poorly responsive, or inconsolable (young children). ° °Chronic Pain Discharge Instructions  °Emergency care providers appreciate that many patients coming to us are in severe pain and we wish to address their pain in the safest, most responsible manner.  It is important to recognize however, that the proper treatment of chronic pain differs from that of the pain of injuries and acute illnesses.  Our goal is to provide quality, safe, personalized care and we thank you for giving us the opportunity to serve you. °The use of narcotics and related agents for chronic pain syndromes may lead to additional physical and psychological problems.  Nearly as many  people die from prescription narcotics each year as die from car crashes.  Additionally, this risk is increased if such prescriptions are obtained from a variety of sources.  Therefore, only your primary care physician or a pain management specialist is able to safely treat such syndromes with narcotic medications long-term.   ° °Documentation revealing such prescriptions have been sought from multiple sources may prohibit us from providing a refill or different narcotic medication.  Your name may be checked first through the [place] Controlled Substances Reporting System.  This database is a record of controlled substance medication prescriptions that the patient has received.  This has been established by [employer] in an effort to eliminate the dangerous, and often life threatening, practice of obtaining multiple prescriptions from different medical providers.  ° °If you have a chronic pain syndrome (i.e. chronic headaches, recurrent back or neck pain, dental pain, abdominal or pelvis pain without a specific diagnosis, or neuropathic pain such as fibromyalgia) or recurrent visits for the same condition without an acute diagnosis, you may be treated with non-narcotics and other non-addictive medicines.  Allergic reactions or negative side effects that may be reported by a patient to such medications will not typically lead to the use of a narcotic analgesic or other controlled substance as an alternative. °  °Patients managing chronic pain with a personal physician should have provisions in place for breakthrough pain.  If you are in crisis, you should call your physician.  If your physician directs you to the emergency department, please have the doctor call and speak to our attending physician concerning your care. °  °When patients come to the Emergency Department (ED) with acute   medical conditions in which the Emergency Department physician feels appropriate to prescribe narcotic or sedating pain  medication, the physician will prescribe these in very limited quantities.  The amount of these medications will last only until you can see your primary care physician in his/her office.  Any patient who returns to the ED seeking refills should expect only non-narcotic pain medications.  ° °In the event of an acute medical condition exists and the emergency physician feels it is necessary that the patient be given a narcotic or sedating medication -  a responsible adult driver should be present in the room prior to the medication being given by the nurse. °  °Prescriptions for narcotic or sedating medications that have been lost, stolen or expired will not be refilled in the Emergency Department.   ° °Patients who have chronic pain may receive non-narcotic prescriptions until seen by their primary care physician.  It is every patient’s personal responsibility to maintain active prescriptions with his or her primary care physician or specialist. °

## 2011-12-21 NOTE — ED Provider Notes (Signed)
History     CSN: [id number]  Arrival date & time 12/20/11  2151   First MD Initiated Contact with Patient 12/21/11 0455      Chief Complaint  Patient presents with  . Abdominal Pain    (Consider location/radiation/quality/duration/timing/severity/associated sxs/prior treatment) HPI This 31 year old female has had 40 emergency department visits within the last 6 months mostly for chronic abdominal pain with vomiting and diarrhea and has had 3 CT scans of the abdomen and pelvis within the last 6 months for the same problems. She already has Zofran and Phenergan home. She has Vicodin at home. She returns today to the emergency department once again stating that her Vicodin is not strong enough. She's had no fever confusion chest pain or shortness of breath. This is typical pain which is always severe it is in her epigastrium right upper quadrant and right lower quadrant. There is no change compared to her multiple prior episodes of worsening severe on top of chronically severe pain. She just had a CT scan within the last week for the same problem. She has had a cholecystectomy and hysterectomy. Past Medical History  Diagnosis Date  . Pancreatitis   . S/P laparoscopic cholecystectomy   . Fibroids   . Ovarian cyst   . Chronic abdominal pain   . Anemia     Past Surgical History  Procedure Date  . Cholecystectomy   . Abdominal hysterectomy   . Dilation and curettage of uterus     Family History  Problem Relation Age of Onset  . Diabetes Mother   . Hypertension Mother   . Hypertension Father   . Diabetes Father   . Asthma Daughter     History  Substance Use Topics  . Smoking status: Current Everyday Smoker -- 0.5 packs/day for 10 years    Types: Cigarettes  . Smokeless tobacco: Never Used   Comment: declined  . Alcohol Use: No    OB History    Grav Para Term Preterm Abortions TAB SAB Ect Mult Living   4 2 1 1 2  2          Review of Systems  Constitutional: Negative  for fever.       10 Systems reviewed and are negative for acute change except as noted in the HPI.  HENT: Negative for congestion.   Eyes: Negative for discharge and redness.  Respiratory: Negative for cough and shortness of breath.   Cardiovascular: Negative for chest pain.  Gastrointestinal: Positive for nausea, vomiting, abdominal pain and diarrhea.  Genitourinary: Negative for dysuria.  Musculoskeletal: Negative for back pain.  Skin: Negative for rash.  Neurological: Negative for syncope, numbness and headaches.  Psychiatric/Behavioral:       No behavior change.    Allergies  Celecoxib; Ketorolac tromethamine; Meperidine hcl; Coconut flavor; Fentanyl; Fish allergy; Ibuprofen; Morphine; Propoxyphene-acetaminophen; and Tramadol hcl  Home Medications   Current Outpatient Rx  Name Route Sig Dispense Refill  . DIPHENHYDRAMINE HCL 50 MG PO CAPS Oral Take 50 mg by mouth every 6 (six) hours as needed. For itching    . ESTRADIOL 2 MG PO TABS Oral Take 2 mg by mouth at bedtime.    Marland Kitchen HYDROCODONE-ACETAMINOPHEN 5-325 MG PO TABS Oral Take 1 tablet by mouth every 6 (six) hours as needed. For pain    . ONDANSETRON HCL 4 MG PO TABS Oral Take 4 mg by mouth every 12 (twelve) hours as needed. For nausea    . PROMETHAZINE HCL 25 MG RE SUPP Rectal  Place 25 mg rectally every 6 (six) hours as needed. For nausea    . PROMETHAZINE HCL 25 MG RE SUPP Rectal Place 1 suppository (25 mg total) rectally every 6 (six) hours as needed for nausea. 12 each 0  . PROMETHAZINE HCL 25 MG RE SUPP Rectal Place 1 suppository (25 mg total) rectally every 6 (six) hours as needed for nausea. 12 each 0  . PROMETHAZINE HCL 25 MG PO TABS Oral Take 1 tablet (25 mg total) by mouth every 6 (six) hours as needed for nausea. 30 tablet 0  . PROMETHAZINE HCL 25 MG PO TABS Oral Take 1 tablet (25 mg total) by mouth every 6 (six) hours as needed for nausea. 15 tablet 0  . PROMETHAZINE HCL 25 MG PO TABS Oral Take 1 tablet (25 mg total) by  mouth every 6 (six) hours as needed for nausea. 30 tablet 0  . PROMETHAZINE HCL 25 MG PO TABS Oral Take 1 tablet (25 mg total) by mouth every 6 (six) hours as needed for nausea. 15 tablet 0  . PROMETHAZINE HCL 25 MG PO TABS Oral Take 25 mg by mouth every 6 (six) hours as needed. For nausea      BP 121/81  Pulse 92  Temp 98.6 F (37 C) (Oral)  Resp 16  SpO2 99%  LMP 06/01/2011  Physical Exam  Nursing note and vitals reviewed. Constitutional:       Awake, alert, nontoxic appearance.  HENT:  Head: Atraumatic.  Mouth/Throat: Oropharynx is clear and moist.  Eyes: Right eye exhibits no discharge. Left eye exhibits no discharge.  Neck: Neck supple.  Cardiovascular: Normal rate and regular rhythm.   No murmur heard. Pulmonary/Chest: Effort normal and breath sounds normal. No respiratory distress. She has no wheezes. She has no rales. She exhibits no tenderness.  Abdominal: Soft. Bowel sounds are normal. She exhibits no mass. There is tenderness. There is no rebound and no guarding.       Minimal tenderness to the epigastrium, right upper quadrant, and right lower quadrant, without peritoneal signs  Musculoskeletal: She exhibits no edema and no tenderness.       Baseline ROM, no obvious new focal weakness.  Neurological: She is alert.       Mental status and motor strength appears baseline for patient and situation.  Skin: No rash noted.  Psychiatric: She has a normal mood and affect.    ED Course  Procedures (including critical care time) The patient is aware she had an unremarkable recent CT scan, her blood counts are unremarkable today except for minimally elevated AST and ALT. Given her chronic pain syndrome I do not think parenteral narcotics aren't indicated at this time and the Pt understands. Labs Reviewed  DIFFERENTIAL - Abnormal; Notable for the following:    Neutrophils Relative 39 (*)     Lymphocytes Relative 48 (*)     Eosinophils Relative 6 (*)     All other components  within normal limits  COMPREHENSIVE METABOLIC PANEL - Abnormal; Notable for the following:    Potassium 3.2 (*)     Glucose, Bld 104 (*)     AST 41 (*)     ALT 41 (*)     Total Bilirubin 0.2 (*)     All other components within normal limits  URINALYSIS, ROUTINE W REFLEX MICROSCOPIC - Abnormal; Notable for the following:    APPearance CLOUDY (*)     Specific Gravity, Urine 1.037 (*)     All other components  within normal limits  CBC  LIPASE, BLOOD  POCT PREGNANCY, URINE   No results found.   1. Chronic abdominal pain       MDM  Patient / Family / Caregiver understand and agree with initial ED impression and plan with expectations set for ED visit.I doubt any other EMC precluding discharge at this time including, but not necessarily limited to the following:SBI, peritonitis.        Hurman Horn, MD 12/21/11 [phone]

## 2011-12-21 NOTE — ED Notes (Signed)
Pt denies any questions upon discharge. 

## 2011-12-21 NOTE — ED Notes (Signed)
Plan of care is updated with verbal understanding, pt is awaiting MD evaluation. Informed pt MD is with a critical pt at this time, will continue to monitor pt.

## 2011-12-21 NOTE — ED Notes (Signed)
No change. Wait time given

## 2011-12-23 ENCOUNTER — Emergency Department (HOSPITAL_COMMUNITY)
Admission: EM | Admit: 2011-12-23 | Discharge: 2011-12-23 | Disposition: A | Discharge status: Home / Self Care | Payer: Medicaid Other | Attending: Emergency Medicine | Admitting: Emergency Medicine

## 2011-12-23 ENCOUNTER — Encounter (HOSPITAL_COMMUNITY): Payer: Self-pay | Admitting: *Deleted

## 2011-12-23 DIAGNOSIS — R1031 Right lower quadrant pain: Secondary | ICD-10-CM

## 2011-12-23 DIAGNOSIS — D649 Anemia, unspecified: Secondary | ICD-10-CM | POA: Insufficient documentation

## 2011-12-23 DIAGNOSIS — L989 Disorder of the skin and subcutaneous tissue, unspecified: Secondary | ICD-10-CM | POA: Insufficient documentation

## 2011-12-23 DIAGNOSIS — R109 Unspecified abdominal pain: Secondary | ICD-10-CM | POA: Insufficient documentation

## 2011-12-23 DIAGNOSIS — F17200 Nicotine dependence, unspecified, uncomplicated: Secondary | ICD-10-CM | POA: Insufficient documentation

## 2011-12-23 NOTE — ED Notes (Signed)
Patient has a boil on her right upper thigh near her labia for 4 weeks that has gotten larger.  She has hx of same with [facility]

## 2011-12-23 NOTE — ED Notes (Signed)
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 States boil to right labia x 4 weeks that is not improving. Pain worse.

## 2011-12-23 NOTE — ED Provider Notes (Signed)
History  This chart was scribed for Gerhard Munch, MD by Bennett Scrape. This patient was seen in room TR05C/TR05C and the patient's care was started at 2:46PM  CSN: [id number]  Arrival date & time 12/23/11  1415   First MD Initiated Contact with Patient 12/23/11 1446      Chief Complaint  Patient presents with  . Rash    The history is provided by the patient. No language interpreter was used.     [name] is a 31 y.o. female who presents to the Emergency Department complaining of 4 weeks of gradual onset, gradually worsening, constant abscess described as a boil on the right groin next to her labia with associated fever, chills, nausea and emesis. The pain is worse with walking. Pt states that she came in today, because the pain is now interrupting her sleep. She denies drainage from the area. She reports that she has been taking Vicodin without improvement. She reports having one prior episode on the other groin several years ago. Pt states that she called her PCP today but was not able to get an appointment. She denies abdominal pain, fatigue and weakness as associated symptoms. She had a CT scan two weeks ago that did not show any abscess in the groin. She has a h/o pancreatitis and anemia. She is a current everyday smoker but denies alcohol use.  Dr. Jovita Kussmaul is PCP.  Past Medical History  Diagnosis Date  . Pancreatitis   . S/P laparoscopic cholecystectomy   . Fibroids   . Ovarian cyst   . Chronic abdominal pain   . Anemia     Past Surgical History  Procedure Date  . Cholecystectomy   . Abdominal hysterectomy   . Dilation and curettage of uterus     Family History  Problem Relation Age of Onset  . Diabetes Mother   . Hypertension Mother   . Hypertension Father   . Diabetes Father   . Asthma Daughter     History  Substance Use Topics  . Smoking status: Current Everyday Smoker -- 0.5 packs/day for 10 years    Types: Cigarettes  . Smokeless  tobacco: Never Used   Comment: declined  . Alcohol Use: No    OB History    Grav Para Term Preterm Abortions TAB SAB Ect Mult Living   4 2 1 1 2  2          Review of Systems  Constitutional: Positive for fever and chills. Negative for fatigue.  Gastrointestinal: Positive for nausea and vomiting. Negative for abdominal pain.  Skin:       Abscess     Allergies  Celecoxib; Ketorolac tromethamine; Meperidine hcl; Coconut flavor; Fentanyl; Fish allergy; Ibuprofen; Morphine; Propoxyphene-acetaminophen; and Tramadol hcl  Home Medications   Current Outpatient Rx  Name Route Sig Dispense Refill  . DIPHENHYDRAMINE HCL 50 MG PO CAPS Oral Take 50 mg by mouth every 6 (six) hours as needed. For itching    . ESTRADIOL 2 MG PO TABS Oral Take 2 mg by mouth at bedtime.    Marland Kitchen HYDROCODONE-ACETAMINOPHEN 5-325 MG PO TABS Oral Take 1 tablet by mouth every 6 (six) hours as needed. For pain    . ONDANSETRON HCL 4 MG PO TABS Oral Take 4 mg by mouth every 12 (twelve) hours as needed. For nausea    . PROMETHAZINE HCL 25 MG PO TABS Oral Take 25 mg by mouth every 6 (six) hours as needed. For nausea    .  PROMETHAZINE HCL 25 MG RE SUPP Rectal Place 1 suppository (25 mg total) rectally every 6 (six) hours as needed for nausea. 12 each 0  . PROMETHAZINE HCL 25 MG RE SUPP Rectal Place 1 suppository (25 mg total) rectally every 6 (six) hours as needed for nausea. 12 each 0  . PROMETHAZINE HCL 25 MG PO TABS Oral Take 1 tablet (25 mg total) by mouth every 6 (six) hours as needed for nausea. 30 tablet 0  . PROMETHAZINE HCL 25 MG PO TABS Oral Take 1 tablet (25 mg total) by mouth every 6 (six) hours as needed for nausea. 15 tablet 0  . PROMETHAZINE HCL 25 MG PO TABS Oral Take 1 tablet (25 mg total) by mouth every 6 (six) hours as needed for nausea. 30 tablet 0  . PROMETHAZINE HCL 25 MG PO TABS Oral Take 1 tablet (25 mg total) by mouth every 6 (six) hours as needed for nausea. 15 tablet 0  . PROMETHAZINE HCL 25 MG PO TABS  Oral Take 25 mg by mouth every 6 (six) hours as needed. For nausea      Triage Vitals: BP 117/75  Pulse 101  Temp 98.5 F (36.9 C) (Oral)  Resp 16  Ht 5\' 5"  (1.651 m)  Wt 160 lb (72.576 kg)  BMI 26.63 kg/m2  SpO2 97%  LMP 06/01/2011  Physical Exam  Nursing note and vitals reviewed. Constitutional: She is oriented to person, place, and time. She appears well-developed and well-nourished. No distress.  HENT:  Head: Normocephalic and atraumatic.  Eyes: Conjunctivae and EOM are normal.  Cardiovascular: Normal rate.   Pulmonary/Chest: Effort normal. No stridor. No respiratory distress.  Abdominal: She exhibits no distension.  Musculoskeletal: She exhibits no edema.  Neurological: She is alert and oriented to person, place, and time. No cranial nerve deficit.  Skin: Skin is warm and dry.       2 cm indurated area on the right groin near the labia, no fluctuance  Psychiatric: She has a normal mood and affect.    ED Course  Procedures (including critical care time)  COORDINATION OF CARE: 4:05PM-Discussed discharge plan with pt and pt agreed to plan.   Labs Reviewed - No data to display No results found.   No diagnosis found.    MDM  I personally performed the services described in this documentation, which was scribed in my presence. The recorded information has been reviewed and considered.   this young female, well-known to this facility for multiple presentations for abdominal pain, including approximately 40 the past 6 months, now presents with inguinal pain.  The patient notes that over the past 4 weeks she said pain and near her lateral inferior labial surface.  On exam the patient has no discernible lesion, beyond possible lymphadenopathy.  There is no evidence of a vulvar lesion, nor superficial changes concerning for ulceration or vesicular wounds.  Absent any drainable collection in the evidence of distress, overlying erythema, the patient was counseled on use of  warm compresses, sitz baths, and careful attention to the wound.  A review of the patient's most recent CAT scan, for 10 days ago did not demonstrate any acute pathology in that area, which seems oximetry to the patient's description of the wound that has been there for 4 weeks.  She was discharged in stable condition.   Gerhard Munch, MD 12/23/11 2001

## 2011-12-23 NOTE — Discharge Instructions (Signed)
As discussed, your wound is not appropriate for incision and drainage.  To improve the condition of your wound, please use warm compresses, and sits baths as discussed.  You should also continue to take your Vicodin as previously prescribed for you.  If you develop any new, or concerning changes in your condition, please return to the emergency department immediately.

## 2011-12-28 ENCOUNTER — Emergency Department (HOSPITAL_COMMUNITY)
Admission: EM | Admit: 2011-12-28 | Discharge: 2011-12-29 | Disposition: A | Discharge status: Home / Self Care | Payer: Medicaid Other | Attending: Emergency Medicine | Admitting: Emergency Medicine

## 2011-12-28 ENCOUNTER — Encounter (HOSPITAL_COMMUNITY): Payer: Self-pay | Admitting: *Deleted

## 2011-12-28 DIAGNOSIS — R1012 Left upper quadrant pain: Secondary | ICD-10-CM | POA: Insufficient documentation

## 2011-12-28 DIAGNOSIS — R109 Unspecified abdominal pain: Secondary | ICD-10-CM

## 2011-12-28 DIAGNOSIS — F17200 Nicotine dependence, unspecified, uncomplicated: Secondary | ICD-10-CM | POA: Insufficient documentation

## 2011-12-28 DIAGNOSIS — D649 Anemia, unspecified: Secondary | ICD-10-CM | POA: Insufficient documentation

## 2011-12-28 NOTE — ED Notes (Signed)
Patient states abdominal pain x 3 weeks, patient with history of pancreatitis and she states pain similar.  Patient states she is unable to keep her meds down due to n/v/d

## 2011-12-29 LAB — CBC
MCH: 27.2 pg (ref 26.0–34.0)
MCHC: 33.9 g/dL (ref 30.0–36.0)
Platelets: 280 10*3/uL (ref 150–400)
RBC: 4.45 MIL/uL (ref 3.87–5.11)

## 2011-12-29 LAB — LIPASE, BLOOD: Lipase: 66 U/L — ABNORMAL HIGH (ref 11–59)

## 2011-12-29 LAB — POCT I-STAT, CHEM 8
Calcium, Ion: 1.14 mmol/L (ref 1.12–1.32)
HCT: 36 % (ref 36.0–46.0)
TCO2: 21 mmol/L (ref 0–100)

## 2011-12-29 MED ORDER — SODIUM CHLORIDE 0.9 % IV BOLUS (SEPSIS)
1000.0000 mL | Freq: Once | INTRAVENOUS | Status: AC
  Administered 2011-12-29: 1000 mL via INTRAVENOUS

## 2011-12-29 MED ORDER — DIPHENHYDRAMINE HCL 50 MG/ML IJ SOLN
25.0000 mg | Freq: Once | INTRAMUSCULAR | Status: AC
  Administered 2011-12-29: 25 mg via INTRAVENOUS
  Filled 2011-12-29: qty 1

## 2011-12-29 MED ORDER — HYDROMORPHONE HCL PF 1 MG/ML IJ SOLN
1.0000 mg | Freq: Once | INTRAMUSCULAR | Status: AC
  Administered 2011-12-29: 1 mg via INTRAVENOUS
  Filled 2011-12-29: qty 1

## 2011-12-29 MED ORDER — ONDANSETRON HCL 4 MG/2ML IJ SOLN
4.0000 mg | Freq: Once | INTRAMUSCULAR | Status: AC
  Administered 2011-12-29: 4 mg via INTRAVENOUS
  Filled 2011-12-29: qty 2

## 2011-12-29 MED ORDER — ONDANSETRON HCL 4 MG PO TABS: 4.0000 mg | ORAL_TABLET | Freq: Four times a day (QID) | ORAL | Status: DC

## 2011-12-29 NOTE — ED Notes (Signed)
DR. Dierdre Highman INSERTED 22 G. IV ANGIOCATH AT LEFT EXTERNAL JUGULAR VEIN , IV NURSE CANCELLED.

## 2011-12-29 NOTE — ED Notes (Signed)
ATTEMPTED TO START IV BUT UNABLE TO START IV ACCES , DUE NO VISIBLE/PALPABLE VEINS , IV NURSE PAGED.

## 2011-12-29 NOTE — Discharge Instructions (Signed)

## 2011-12-29 NOTE — ED Provider Notes (Signed)
History     CSN: [id number]  Arrival date & time 12/28/11  2038   First MD Initiated Contact with Patient 12/29/11 0013      Chief Complaint  Patient presents with  . Abdominal Pain    (Consider location/radiation/quality/duration/timing/severity/associated sxs/prior treatment) HPI History provided by patient. History of pancreatitis and having upper abdominal pain for the last 4 weeks, worsening over the last 24 hours. Has associated nausea and vomiting and unable to hold down medications at home. No fevers or chills. No change in medications. His prescribed Phenergan, Benadryl and Vicodin by primary care physician. History of cholecystectomy in the past and persistent recurrent pancreatitis. Patient denies any alcohol use. History of chronic abdominal pain. moderate to severe and sharp in quality and not radiating. no bloody or bilious emesis. Past Medical History  Diagnosis Date  . Pancreatitis   . S/P laparoscopic cholecystectomy   . Fibroids   . Ovarian cyst   . Chronic abdominal pain   . Anemia     Past Surgical History  Procedure Date  . Cholecystectomy   . Abdominal hysterectomy   . Dilation and curettage of uterus     Family History  Problem Relation Age of Onset  . Diabetes Mother   . Hypertension Mother   . Hypertension Father   . Diabetes Father   . Asthma Daughter     History  Substance Use Topics  . Smoking status: Current Everyday Smoker -- 0.5 packs/day for 10 years    Types: Cigarettes  . Smokeless tobacco: Never Used   Comment: declined  . Alcohol Use: No    OB History    Grav Para Term Preterm Abortions TAB SAB Ect Mult Living   4 2 1 1 2  2          Review of Systems  Constitutional: Negative for fever and chills.  HENT: Negative for neck pain and neck stiffness.   Eyes: Negative for pain.  Respiratory: Negative for shortness of breath.   Cardiovascular: Negative for chest pain.  Gastrointestinal: Positive for nausea, vomiting and  abdominal pain.  Genitourinary: Negative for dysuria.  Musculoskeletal: Negative for back pain.  Skin: Negative for rash.  Neurological: Negative for headaches.  All other systems reviewed and are negative.    Allergies  Celecoxib; Ketorolac tromethamine; Meperidine hcl; Coconut flavor; Fentanyl; Fish allergy; Ibuprofen; Morphine; Propoxyphene-acetaminophen; and Tramadol hcl  Home Medications   Current Outpatient Rx  Name Route Sig Dispense Refill  . DIPHENHYDRAMINE HCL 50 MG PO CAPS Oral Take 50 mg by mouth every 6 (six) hours as needed. For itching    . ESTRADIOL 2 MG PO TABS Oral Take 2 mg by mouth at bedtime.    Marland Kitchen HYDROCODONE-ACETAMINOPHEN 5-325 MG PO TABS Oral Take 1 tablet by mouth every 6 (six) hours as needed. For pain    . ONDANSETRON HCL 4 MG PO TABS Oral Take 4 mg by mouth every 12 (twelve) hours as needed. For nausea    . PROMETHAZINE HCL 25 MG PO TABS Oral Take 25 mg by mouth every 6 (six) hours as needed. For nausea      BP 131/81  Pulse 83  Temp 98.6 F (37 C) (Oral)  Resp 18  SpO2 99%  LMP 06/01/2011  Physical Exam  Constitutional: She is oriented to person, place, and time. She appears well-developed and well-nourished.  HENT:  Head: Normocephalic and atraumatic.  Eyes: Conjunctivae and EOM are normal. Pupils are equal, round, and reactive to light.  Neck: Trachea normal. Neck supple. No thyromegaly present.  Cardiovascular: Normal rate, regular rhythm, S1 normal, S2 normal and normal pulses.     No systolic murmur is present   No diastolic murmur is present  Pulses:      Radial pulses are 2+ on the right side, and 2+ on the left side.  Pulmonary/Chest: Effort normal and breath sounds normal. She has no wheezes. She has no rhonchi. She has no rales. She exhibits no tenderness.  Abdominal: Soft. Normal appearance and bowel sounds are normal. There is no rebound, no guarding, no CVA tenderness and negative Murphy's sign.       Tender left upper quadrant  without peritonitis  Musculoskeletal:       BLE:s Calves nontender, no cords or erythema, negative Homans sign  Neurological: She is alert and oriented to person, place, and time. She has normal strength. No cranial nerve deficit or sensory deficit. GCS eye subscore is 4. GCS verbal subscore is 5. GCS motor subscore is 6.  Skin: Skin is warm and dry. No rash noted. She is not diaphoretic.  Psychiatric: Her speech is normal.       Cooperative and appropriate    ED Course  Angiocath insertion Date/Time: 12/29/2011 1:11 AM Performed by: Sunnie Nielsen Authorized by: Sunnie Nielsen Consent: Verbal consent obtained. Risks and benefits: risks, benefits and alternatives were discussed Consent given by: patient Patient understanding: patient states understanding of the procedure being performed Patient consent: the patient's understanding of the procedure matches consent given Procedure consent: procedure consent matches procedure scheduled Required items: required blood products, implants, devices, and special equipment available Patient identity confirmed: verbally with patient Time out: Immediately prior to procedure a "time out" was called to verify the correct patient, procedure, equipment, support staff and site/side marked as required. Preparation: Patient was prepped and draped in the usual sterile fashion. Patient tolerance: Patient tolerated the procedure well with no immediate complications. Comments: Left-sided external jugular 20-gauge placed due to poor IV access and multiple failed attempts for peripheral IVs by RN.  Drawback and flush. Secured in place with tape and dressing.   (including critical care time)  Results for orders placed during the hospital encounter of 12/28/11  CBC      Component Value Range   WBC 7.7  4.0 - 10.5 K/uL   RBC 4.45  3.87 - 5.11 MIL/uL   Hemoglobin 12.1  12.0 - 15.0 g/dL   HCT 16.1 (*) 09.6 - 04.5 %   MCV 80.2  78.0 - 100.0 fL   MCH 27.2  26.0 -  34.0 pg   MCHC 33.9  30.0 - 36.0 g/dL   RDW 40.9  81.1 - 91.4 %   Platelets 280  150 - 400 K/uL  LIPASE, BLOOD      Component Value Range   Lipase 66 (*) 11 - 59 U/L  POCT I-STAT, CHEM 8      Component Value Range   Sodium 142  135 - 145 mEq/L   Potassium 3.6  3.5 - 5.1 mEq/L   Chloride 109  96 - 112 mEq/L   BUN 15  6 - 23 mg/dL   Creatinine, Ser 7.82  0.50 - 1.10 mg/dL   Glucose, Bld 96  70 - 99 mg/dL   Calcium, Ion 9.56  2.13 - 1.32 mmol/L   TCO2 21  0 - 100 mmol/L   Hemoglobin 12.2  12.0 - 15.0 g/dL   HCT 08.6  57.8 - 46.9 %  Ct Abdomen Pelvis W Contrast  12/15/2011  *RADIOLOGY s*  Clinical Data: Right lower quadrant pain, nausea/vomiting  CT ABDOMEN AND PELVIS WITH CONTRAST  Technique:  Multidetector CT imaging of the abdomen and pelvis was performed following the standard protocol during bolus administration of intravenous contrast.  Contrast: 80mL OMNIPAQUE IOHEXOL 300 MG/ML  SOLN  Comparison: 09/06/2011  Findings: Minimal dependent atelectasis at the lung bases.  Liver, spleen, and adrenal glands are within normal limits.  No peripancreatic inflammatory changes by CT.  Status post cholecystectomy. Stable mild intrahepatic/extrahepatic ductal dilatation, likely postsurgical.  Stable 14 x 11 mm right renal cyst.  Left kidney is within normal limits.  No hydronephrosis.  No evidence of bowel obstruction.  Appendix is not discretely visualized but there are no inflammatory changes in the right lower quadrant.  No evidence of abdominal aortic aneurysm.  No abdominopelvic ascites.  No suspicious abdominopelvic lymphadenopathy.  Status post hysterectomy.  No adnexal masses.  Bladder is within normal limits.  Visualized osseous structures are within normal limits.  IMPRESSION: Appendix is not discretely visualized.  No inflammatory changes in the right lower quadrant to suggest acute appendicitis.  No evidence of bowel obstruction.  No peripancreatic inflammatory changes by CT.  No CT findings  to account for the patient's abdominal pain.  Original Report Authenticated By: Charline Bills, M.D.       MDM   Left upper quadrant pain history of pancreatitis  IV fluids. IV Dilaudid. IV Zofran. IV Benadryl by request. Serial abdominal examinations without peritonitis. Symptoms improved. Labs reviewed as above. Old records reviewed as above.  No indication for repeat imaging with CAT scan from 2 weeks ago reviewed.  Patient has primary care followup. Prescription for Zofran provided in addition to Phenergan and has hydrocodone at home. Stable for discharge at this time.        Sunnie Nielsen, MD 12/29/11 [phone]

## 2011-12-31 ENCOUNTER — Emergency Department (HOSPITAL_COMMUNITY)
Admission: EM | Admit: 2011-12-31 | Discharge: 2012-01-01 | Disposition: A | Discharge status: Home / Self Care | Payer: Medicaid Other | Attending: Emergency Medicine | Admitting: Emergency Medicine

## 2011-12-31 ENCOUNTER — Encounter (HOSPITAL_COMMUNITY): Payer: Self-pay | Admitting: Emergency Medicine

## 2011-12-31 DIAGNOSIS — Z886 Allergy status to analgesic agent status: Secondary | ICD-10-CM | POA: Insufficient documentation

## 2011-12-31 DIAGNOSIS — Z91013 Allergy to seafood: Secondary | ICD-10-CM | POA: Insufficient documentation

## 2011-12-31 DIAGNOSIS — J020 Streptococcal pharyngitis: Secondary | ICD-10-CM

## 2011-12-31 DIAGNOSIS — Z885 Allergy status to narcotic agent status: Secondary | ICD-10-CM | POA: Insufficient documentation

## 2011-12-31 DIAGNOSIS — Z79899 Other long term (current) drug therapy: Secondary | ICD-10-CM | POA: Insufficient documentation

## 2011-12-31 NOTE — ED Notes (Signed)
Updated in w/r 

## 2011-12-31 NOTE — ED Notes (Signed)
PT. REPORTS PERSISTENT/ CHRONIC  GENERALIZED ABDOMINAL PAIN WITH VOMITTING AND DIARRHEA FOR SEVERAL YEARS , SEEN HERE THIS WEEK FOR THE SAME COMPLAINTS  AND WAS DISCHARGED HOME .

## 2011-12-31 NOTE — ED Notes (Signed)
Acuity changed to 4 based on pts initial complaint, changed back to 3 d/t pts additional added complaints.

## 2011-12-31 NOTE — ED Notes (Signed)
At registration: pt states here for L neck lum s/p having an "IJ" angiocath IV. Reports drainage and pus. Minimal swelling possible. No s/sx of infection, no redness or drainage noted. Appears unremarkable. Skin intact.

## 2012-01-01 MED ORDER — DEXAMETHASONE SODIUM PHOSPHATE 4 MG/ML IJ SOLN
8.0000 mg | Freq: Once | INTRAMUSCULAR | Status: AC
  Administered 2012-01-01: 8 mg via INTRAMUSCULAR
  Filled 2012-01-01: qty 2

## 2012-01-01 MED ORDER — PENICILLIN G BENZATHINE 1200000 UNIT/2ML IM SUSP
1.2000 10*6.[IU] | Freq: Once | INTRAMUSCULAR | Status: AC
  Administered 2012-01-01: 1.2 10*6.[IU] via INTRAMUSCULAR

## 2012-01-01 MED ORDER — OXYCODONE-ACETAMINOPHEN 5-325 MG PO TABS
1.0000 | ORAL_TABLET | Freq: Once | ORAL | Status: AC
  Administered 2012-01-01: 1 via ORAL
  Filled 2012-01-01: qty 1

## 2012-01-01 MED ORDER — DEXAMETHASONE SODIUM PHOSPHATE 4 MG/ML IJ SOLN: 8.0000 mg | Freq: Once | INTRAMUSCULAR | Status: DC

## 2012-01-01 NOTE — ED Provider Notes (Signed)
History     CSN: [id number]  Arrival date & time 12/31/11  2057   First MD Initiated Contact with Patient 12/31/11 2328      Chief Complaint  Patient presents with  . Abdominal Pain    (Consider location/radiation/quality/duration/timing/severity/associated sxs/prior treatment) HPI Pt seen several days ago for abdominal pain. States that she has had sore throat, anterior neck pain, HA, fatigue and muscle aches. Abdominal pain is improved with minimal vomiting. Denied fever.  Past Medical History  Diagnosis Date  . Pancreatitis   . S/P laparoscopic cholecystectomy   . Fibroids   . Ovarian cyst   . Chronic abdominal pain   . Anemia     Past Surgical History  Procedure Date  . Cholecystectomy   . Abdominal hysterectomy   . Dilation and curettage of uterus     Family History  Problem Relation Age of Onset  . Diabetes Mother   . Hypertension Mother   . Hypertension Father   . Diabetes Father   . Asthma Daughter     History  Substance Use Topics  . Smoking status: Current Everyday Smoker -- 0.5 packs/day for 10 years    Types: Cigarettes  . Smokeless tobacco: Never Used   Comment: declined  . Alcohol Use: No    OB History    Grav Para Term Preterm Abortions TAB SAB Ect Mult Living   4 2 1 1 2  2          Review of Systems  Constitutional: Positive for fatigue. Negative for fever and chills.  HENT: Positive for sore throat. Negative for congestion and rhinorrhea.   Gastrointestinal: Positive for abdominal pain. Negative for nausea, vomiting and diarrhea.  Musculoskeletal: Positive for myalgias.    Allergies  Celecoxib; Ketorolac tromethamine; Meperidine hcl; Coconut flavor; Fentanyl; Fish allergy; Ibuprofen; Morphine; Propoxyphene-acetaminophen; and Tramadol hcl  Home Medications   Current Outpatient Rx  Name Route Sig Dispense Refill  . DIPHENHYDRAMINE HCL 50 MG PO CAPS Oral Take 50 mg by mouth every 6 (six) hours as needed. For itching    . ESTRADIOL  2 MG PO TABS Oral Take 2 mg by mouth at bedtime.    Marland Kitchen HYDROCODONE-ACETAMINOPHEN 5-325 MG PO TABS Oral Take 1 tablet by mouth every 6 (six) hours as needed. For pain    . ONDANSETRON HCL 4 MG PO TABS Oral Take 4 mg by mouth every 12 (twelve) hours as needed. For nausea    . PROMETHAZINE HCL 25 MG PO TABS Oral Take 25 mg by mouth every 6 (six) hours as needed. For nausea      BP 127/103  Temp 98.7 F (37.1 C) (Oral)  SpO2 99%  LMP 06/01/2011  Physical Exam  Nursing note and vitals reviewed. Constitutional: She is oriented to person, place, and time. She appears well-developed and well-nourished. No distress.  HENT:  Head: Normocephalic and atraumatic.  Mouth/Throat: Oropharyngeal exudate present.  Eyes: EOM are normal. Pupils are equal, round, and reactive to light.  Neck: Normal range of motion. Neck supple.  Cardiovascular: Normal rate and regular rhythm.   Pulmonary/Chest: Effort normal and breath sounds normal. No respiratory distress. She has no wheezes. She has no rales.  Abdominal: Soft. Bowel sounds are normal. She exhibits no distension and no mass. There is no tenderness. There is no rebound and no guarding.  Musculoskeletal: Normal range of motion. She exhibits no edema and no tenderness.  Lymphadenopathy:    She has cervical adenopathy.  Neurological: She is alert and  oriented to person, place, and time.  Skin: Skin is warm and dry. No rash noted. No erythema.  Psychiatric: She has a normal mood and affect. Her behavior is normal.    ED Course  Procedures (including critical care time)  Labs Reviewed - No data to display No results found.   1. Strep throat       MDM  Will treat with IM decadron and PCN. F/u with PMD in 2 days if symptoms persist.         Loren Racer, MD 01/01/12 [phone]

## 2012-01-01 NOTE — Discharge Instructions (Signed)

## 2012-01-02 ENCOUNTER — Emergency Department (HOSPITAL_COMMUNITY)
Admission: EM | Admit: 2012-01-02 | Discharge: 2012-01-02 | Disposition: A | Discharge status: Home / Self Care | Payer: Medicaid Other | Attending: Emergency Medicine | Admitting: Emergency Medicine

## 2012-01-02 ENCOUNTER — Encounter (HOSPITAL_COMMUNITY): Payer: Self-pay | Admitting: Emergency Medicine

## 2012-01-02 DIAGNOSIS — K861 Other chronic pancreatitis: Secondary | ICD-10-CM | POA: Insufficient documentation

## 2012-01-02 DIAGNOSIS — R109 Unspecified abdominal pain: Secondary | ICD-10-CM | POA: Insufficient documentation

## 2012-01-02 DIAGNOSIS — R1013 Epigastric pain: Secondary | ICD-10-CM | POA: Insufficient documentation

## 2012-01-02 DIAGNOSIS — Z79899 Other long term (current) drug therapy: Secondary | ICD-10-CM | POA: Insufficient documentation

## 2012-01-02 DIAGNOSIS — F17200 Nicotine dependence, unspecified, uncomplicated: Secondary | ICD-10-CM | POA: Insufficient documentation

## 2012-01-02 DIAGNOSIS — R1110 Vomiting, unspecified: Secondary | ICD-10-CM | POA: Insufficient documentation

## 2012-01-02 LAB — COMPREHENSIVE METABOLIC PANEL
AST: 22 U/L (ref 0–37)
Albumin: 4.1 g/dL (ref 3.5–5.2)
Alkaline Phosphatase: 102 U/L (ref 39–117)
Chloride: 103 mEq/L (ref 96–112)
Creatinine, Ser: 0.78 mg/dL (ref 0.50–1.10)
Potassium: 3.4 mEq/L — ABNORMAL LOW (ref 3.5–5.1)
Total Bilirubin: 0.3 mg/dL (ref 0.3–1.2)

## 2012-01-02 LAB — POCT PREGNANCY, URINE: Preg Test, Ur: NEGATIVE

## 2012-01-02 LAB — LIPASE, BLOOD: Lipase: 51 U/L (ref 11–59)

## 2012-01-02 LAB — URINALYSIS, ROUTINE W REFLEX MICROSCOPIC
Glucose, UA: NEGATIVE mg/dL
Protein, ur: 30 mg/dL — AB

## 2012-01-02 LAB — CBC
Platelets: 307 10*3/uL (ref 150–400)
RDW: 12.4 % (ref 11.5–15.5)
WBC: 11.4 10*3/uL — ABNORMAL HIGH (ref 4.0–10.5)

## 2012-01-02 MED ORDER — HYDROMORPHONE HCL PF 1 MG/ML IJ SOLN
2.0000 mg | Freq: Once | INTRAMUSCULAR | Status: AC
  Administered 2012-01-02: 2 mg via INTRAVENOUS
  Filled 2012-01-02 (×2): qty 2

## 2012-01-02 MED ORDER — DIPHENHYDRAMINE HCL 50 MG/ML IJ SOLN
25.0000 mg | Freq: Once | INTRAMUSCULAR | Status: AC
  Administered 2012-01-02: 25 mg via INTRAVENOUS
  Filled 2012-01-02: qty 1

## 2012-01-02 MED ORDER — OXYCODONE-ACETAMINOPHEN 5-325 MG PO TABS: 1.0000 | ORAL_TABLET | ORAL | Status: DC | PRN

## 2012-01-02 MED ORDER — PROMETHAZINE HCL 25 MG/ML IJ SOLN
25.0000 mg | Freq: Once | INTRAMUSCULAR | Status: AC
  Administered 2012-01-02: 25 mg via INTRAVENOUS
  Filled 2012-01-02: qty 1

## 2012-01-02 MED ORDER — DIPHENHYDRAMINE HCL 50 MG/ML IJ SOLN
25.0000 mg | Freq: Once | INTRAMUSCULAR | Status: AC
  Administered 2012-01-02: 21:00:00 via INTRAVENOUS
  Filled 2012-01-02: qty 1

## 2012-01-02 MED ORDER — HYDROMORPHONE HCL PF 1 MG/ML IJ SOLN
2.0000 mg | Freq: Once | INTRAMUSCULAR | Status: AC
  Administered 2012-01-02: 2 mg via INTRAVENOUS
  Filled 2012-01-02 (×2): qty 1

## 2012-01-02 NOTE — ED Notes (Signed)
C/o abd pain, nausea, vomiting, diarrhea, and dizziness x 5 weeks.

## 2012-01-02 NOTE — ED Notes (Signed)
MD at bedside. 

## 2012-01-02 NOTE — ED Notes (Signed)
IV team at bedside 

## 2012-01-02 NOTE — ED Provider Notes (Addendum)
History     CSN: [id number]  Arrival date & time 01/02/12  1645   First MD Initiated Contact with Patient 01/02/12 1848      Chief Complaint  Patient presents with  . Emesis  . Abdominal Pain    (Consider location/radiation/quality/duration/timing/severity/associated sxs/prior treatment) HPI  H/o chronic pancreatitis pw abdominal pain x 5 days. Describes as epigastric pain, currently 10/10, progressively worsening, constant x 5 weeks. Sharp pain with radiation to back. +Nausea bilious emesis today--several times. Unable to tolerate PO. Diarrhea x 5 weeks "constantly, every time I eat or drink". Denies blood in stool. Denies fever +Chills. Has taken zofran and phenergan at home without relief. Denies hematuria/dysuria/freq/urgency  H/O cholecystectomy   ED Notes, ED Provider Notes from 01/02/12 0000 to 01/02/12 16:54:19       Thomasene Ripple, RN 01/02/2012 16:53      C/o abd pain, nausea, vomiting, diarrhea, and dizziness x 5 weeks.     Past Medical History  Diagnosis Date  . Pancreatitis   . S/P laparoscopic cholecystectomy   . Fibroids   . Ovarian cyst   . Chronic abdominal pain   . Anemia     Past Surgical History  Procedure Date  . Cholecystectomy   . Abdominal hysterectomy   . Dilation and curettage of uterus     Family History  Problem Relation Age of Onset  . Diabetes Mother   . Hypertension Mother   . Hypertension Father   . Diabetes Father   . Asthma Daughter     History  Substance Use Topics  . Smoking status: Current Everyday Smoker -- 0.5 packs/day for 10 years    Types: Cigarettes  . Smokeless tobacco: Never Used   Comment: declined  . Alcohol Use: No    OB History    Grav Para Term Preterm Abortions TAB SAB Ect Mult Living   4 2 1 1 2  2          Review of Systems  All other systems reviewed and are negative.   except as noted HPI   Allergies  Celecoxib; Ketorolac tromethamine; Meperidine hcl; Coconut flavor; Fentanyl; Fish  allergy; Ibuprofen; Morphine; Propoxyphene-acetaminophen; and Tramadol hcl  Home Medications   Current Outpatient Rx  Name Route Sig Dispense Refill  . DIPHENHYDRAMINE HCL 50 MG PO CAPS Oral Take 50 mg by mouth every 6 (six) hours as needed. For itching    . ESTRADIOL 2 MG PO TABS Oral Take 2 mg by mouth at bedtime.    Marland Kitchen HYDROCODONE-ACETAMINOPHEN 5-325 MG PO TABS Oral Take 1 tablet by mouth every 6 (six) hours as needed. For pain    . ONDANSETRON HCL 4 MG PO TABS Oral Take 4 mg by mouth every 12 (twelve) hours as needed. For nausea    . PROMETHAZINE HCL 25 MG PO TABS Oral Take 25 mg by mouth every 6 (six) hours as needed. For nausea      BP 129/78  Pulse 84  Temp 98.5 F (36.9 C) (Oral)  Resp 18  SpO2 96%  LMP 06/01/2011  Physical Exam  Nursing note and vitals reviewed. Constitutional: She is oriented to person, place, and time. She appears well-developed.  HENT:  Head: Atraumatic.  Mouth/Throat: Oropharynx is clear and moist.  Eyes: Conjunctivae and EOM are normal. Pupils are equal, round, and reactive to light.  Neck: Normal range of motion. Neck supple.  Cardiovascular: Normal rate, regular rhythm, normal heart sounds and intact distal pulses.   Pulmonary/Chest: Effort normal  and breath sounds normal. No respiratory distress. She has no wheezes. She has no rales.  Abdominal: Soft. She exhibits no distension. There is tenderness. There is no rebound and no guarding.  Musculoskeletal: Normal range of motion.  Neurological: She is alert and oriented to person, place, and time.  Skin: Skin is warm and dry. No rash noted.  Psychiatric: She has a normal mood and affect.    ED Course  Procedures (including critical care time)  Labs Reviewed  CBC - Abnormal; Notable for the following:    WBC 11.4 (*)     All other components within normal limits  COMPREHENSIVE METABOLIC PANEL - Abnormal; Notable for the following:    Potassium 3.4 (*)     ALT 42 (*)     All other components  within normal limits  URINALYSIS, ROUTINE W REFLEX MICROSCOPIC - Abnormal; Notable for the following:    Color, Urine AMBER (*)  BIOCHEMICALS MAY BE AFFECTED BY COLOR   APPearance CLOUDY (*)     Specific Gravity, Urine 1.034 (*)     Hgb urine dipstick TRACE (*)     Bilirubin Urine SMALL (*)     Protein, ur 30 (*)     Leukocytes, UA TRACE (*)     All other components within normal limits  LIPASE, BLOOD  POCT PREGNANCY, URINE  URINE MICROSCOPIC-ADD ON   No results found.   1. Chronic pancreatitis   2. Abdominal pain     MDM  Abdominal pain consistent with her chronic pancreatitis per pt. Labs unremarkable. CT A/P within 5 weeks (onset of pain) unremarkable.  Plans for pain control, PO challenge, and home with pmd f/u.        Forbes Cellar, MD 01/03/12 1610  Forbes Cellar, MD 01/03/12 9604

## 2012-01-10 ENCOUNTER — Encounter (HOSPITAL_COMMUNITY): Payer: Self-pay | Admitting: Emergency Medicine

## 2012-01-10 ENCOUNTER — Emergency Department (HOSPITAL_COMMUNITY)
Admission: EM | Admit: 2012-01-10 | Discharge: 2012-01-10 | Disposition: A | Discharge status: Home / Self Care | Payer: Medicaid Other | Attending: Emergency Medicine | Admitting: Emergency Medicine

## 2012-01-10 DIAGNOSIS — R7402 Elevation of levels of lactic acid dehydrogenase (LDH): Secondary | ICD-10-CM | POA: Insufficient documentation

## 2012-01-10 DIAGNOSIS — R1110 Vomiting, unspecified: Secondary | ICD-10-CM | POA: Insufficient documentation

## 2012-01-10 DIAGNOSIS — R109 Unspecified abdominal pain: Secondary | ICD-10-CM | POA: Insufficient documentation

## 2012-01-10 DIAGNOSIS — R7401 Elevation of levels of liver transaminase levels: Secondary | ICD-10-CM | POA: Insufficient documentation

## 2012-01-10 DIAGNOSIS — G43909 Migraine, unspecified, not intractable, without status migrainosus: Secondary | ICD-10-CM | POA: Insufficient documentation

## 2012-01-10 DIAGNOSIS — F17200 Nicotine dependence, unspecified, uncomplicated: Secondary | ICD-10-CM | POA: Insufficient documentation

## 2012-01-10 LAB — COMPREHENSIVE METABOLIC PANEL
Albumin: 3.8 g/dL (ref 3.5–5.2)
Alkaline Phosphatase: 80 U/L (ref 39–117)
BUN: 18 mg/dL (ref 6–23)
Chloride: 105 mEq/L (ref 96–112)
Creatinine, Ser: 0.6 mg/dL (ref 0.50–1.10)
GFR calc Af Amer: >90 mL/min (ref >90)
Glucose, Bld: 92 mg/dL (ref 70–99)
Potassium: 3.9 mEq/L (ref 3.5–5.1)
Total Bilirubin: 0.1 mg/dL — ABNORMAL LOW (ref 0.3–1.2)

## 2012-01-10 LAB — CBC WITH DIFFERENTIAL/PLATELET
Basophils Relative: 0 % (ref 0–1)
Eosinophils Absolute: 0.3 10*3/uL (ref 0.0–0.7)
HCT: 36.5 % (ref 36.0–46.0)
Hemoglobin: 12.3 g/dL (ref 12.0–15.0)
Lymphs Abs: 3.2 10*3/uL (ref 0.7–4.0)
MCH: 27.2 pg (ref 26.0–34.0)
MCHC: 33.7 g/dL (ref 30.0–36.0)
Monocytes Absolute: 0.9 10*3/uL (ref 0.1–1.0)
Monocytes Relative: 9 % (ref 3–12)
Neutro Abs: 5 10*3/uL (ref 1.7–7.7)
Neutrophils Relative %: 53 % (ref 43–77)
RBC: 4.53 MIL/uL (ref 3.87–5.11)

## 2012-01-10 LAB — LIPASE, BLOOD: Lipase: 58 U/L (ref 11–59)

## 2012-01-10 MED ORDER — SODIUM CHLORIDE 0.9 % IV BOLUS (SEPSIS)
1000.0000 mL | Freq: Once | INTRAVENOUS | Status: AC
  Administered 2012-01-10: 1000 mL via INTRAVENOUS

## 2012-01-10 MED ORDER — DIPHENHYDRAMINE HCL 50 MG/ML IJ SOLN
25.0000 mg | Freq: Once | INTRAMUSCULAR | Status: AC
  Administered 2012-01-10: 25 mg via INTRAVENOUS
  Filled 2012-01-10: qty 1

## 2012-01-10 MED ORDER — HYDROMORPHONE HCL PF 1 MG/ML IJ SOLN
2.0000 mg | Freq: Once | INTRAMUSCULAR | Status: AC
  Administered 2012-01-10: 2 mg via INTRAVENOUS
  Filled 2012-01-10: qty 2

## 2012-01-10 MED ORDER — DIPHENHYDRAMINE HCL 50 MG/ML IJ SOLN
25.0000 mg | Freq: Once | INTRAMUSCULAR | Status: AC
  Administered 2012-01-10: 50 mg via INTRAVENOUS
  Filled 2012-01-10: qty 1

## 2012-01-10 MED ORDER — HYDROMORPHONE HCL PF 1 MG/ML IJ SOLN
2.0000 mg | Freq: Once | INTRAMUSCULAR | Status: AC
  Administered 2012-01-10: 2 mg via INTRAVENOUS
  Filled 2012-01-10 (×2): qty 1

## 2012-01-10 MED ORDER — SODIUM CHLORIDE 0.9 % IV SOLN
Freq: Once | INTRAVENOUS | Status: AC
  Administered 2012-01-10: 22:00:00 via INTRAVENOUS

## 2012-01-10 MED ORDER — METOCLOPRAMIDE HCL 5 MG/ML IJ SOLN
10.0000 mg | Freq: Once | INTRAMUSCULAR | Status: AC
  Administered 2012-01-10: 10 mg via INTRAVENOUS
  Filled 2012-01-10: qty 2

## 2012-01-10 NOTE — ED Provider Notes (Signed)
History     CSN: [id number]  Arrival date & time 01/10/12  1753   First MD Initiated Contact with Patient 01/10/12 2103      Chief Complaint  Patient presents with  . Abdominal Pain  . Emesis    (Consider location/radiation/quality/duration/timing/severity/associated sxs/prior treatment) Patient is a 31 y.o. female presenting with abdominal pain and vomiting. The history is provided by the patient.  Abdominal Pain The primary symptoms of the illness include abdominal pain and vomiting.  Emesis  Associated symptoms include abdominal pain.  She complains of a bifrontal headache for the last 2 days. Headache is dull and throbbing and worse with exposure to light. There is associated nausea and vomiting. She denies fever or chills. She's also been having upper abdominal pain for about the last 6 days which is also associated with nausea and vomiting. Also, no fever or or chills. She denies constipation or diarrhea. Pain does sometimes radiate into her back. Nothing makes her abdominal pain worse and nothing makes it better. She's tried taking Vicodin and Zofran and Benadryl with no relief. Pain is severe and she rates it at 10/10.  Past Medical History  Diagnosis Date  . Pancreatitis   . S/P laparoscopic cholecystectomy   . Fibroids   . Ovarian cyst   . Chronic abdominal pain   . Anemia     Past Surgical History  Procedure Date  . Cholecystectomy   . Abdominal hysterectomy   . Dilation and curettage of uterus     Family History  Problem Relation Age of Onset  . Diabetes Mother   . Hypertension Mother   . Hypertension Father   . Diabetes Father   . Asthma Daughter     History  Substance Use Topics  . Smoking status: Current Everyday Smoker -- 0.5 packs/day for 10 years    Types: Cigarettes  . Smokeless tobacco: Never Used   Comment: declined  . Alcohol Use: No    OB History    Grav Para Term Preterm Abortions TAB SAB Ect Mult Living   4 2 1 1 2  2           Review of Systems  Gastrointestinal: Positive for vomiting and abdominal pain.  All other systems reviewed and are negative.    Allergies  Celecoxib; Ketorolac tromethamine; Meperidine hcl; Coconut flavor; Fentanyl; Fish allergy; Ibuprofen; Morphine; Propoxyphene-acetaminophen; and Tramadol hcl  Home Medications   Current Outpatient Rx  Name Route Sig Dispense Refill  . DIPHENHYDRAMINE HCL 50 MG PO CAPS Oral Take 50 mg by mouth every 6 (six) hours as needed. For itching    . ESTRADIOL 2 MG PO TABS Oral Take 2 mg by mouth at bedtime.    Marland Kitchen HYDROCODONE-ACETAMINOPHEN 5-325 MG PO TABS Oral Take 1 tablet by mouth every 6 (six) hours as needed. For pain    . ONDANSETRON HCL 4 MG PO TABS Oral Take 4 mg by mouth every 12 (twelve) hours as needed. For nausea    . PROMETHAZINE HCL 25 MG PO TABS Oral Take 25 mg by mouth every 6 (six) hours as needed. For nausea      BP 121/72  Pulse 85  Temp 98.7 F (37.1 C) (Oral)  Resp 18  SpO2 100%  LMP 06/01/2011  Physical Exam  Nursing note and vitals reviewed.  31 year old female is resting comfortably and in no acute distress. Vital signs are normal. Oxygen saturation is 100% which is normal. Head is normocephalic and atraumatic. PERRLA, EOMI. Oropharynx is  clear. Neck is nontender and supple. Back is nontender. Lungs are clear without rales, wheezes, rhonchi. Heart has regular rate and rhythm without murmur. Abdomen is soft, flat, with moderate epigastric tenderness. There is no rebound or guarding. There no masses or hepatosplenomegaly. Peristalsis is decreased. Extremities have full range of motion, no cyanosis or edema. Skin is warm and dry without rash. Neurologic: Mental status is normal, cranial nerves are intact, there are no motor or sensory deficits.  ED Course  Procedures (including critical care time)  Results for orders placed during the hospital encounter of 01/10/12  CBC WITH DIFFERENTIAL      Component Value Range   WBC 9.3  4.0  - 10.5 K/uL   RBC 4.53  3.87 - 5.11 MIL/uL   Hemoglobin 12.3  12.0 - 15.0 g/dL   HCT 86.5  78.4 - 69.6 %   MCV 80.6  78.0 - 100.0 fL   MCH 27.2  26.0 - 34.0 pg   MCHC 33.7  30.0 - 36.0 g/dL   RDW 29.5  28.4 - 13.2 %   Platelets 282  150 - 400 K/uL   Neutrophils Relative 53  43 - 77 %   Neutro Abs 5.0  1.7 - 7.7 K/uL   Lymphocytes Relative 34  12 - 46 %   Lymphs Abs 3.2  0.7 - 4.0 K/uL   Monocytes Relative 9  3 - 12 %   Monocytes Absolute 0.9  0.1 - 1.0 K/uL   Eosinophils Relative 4  0 - 5 %   Eosinophils Absolute 0.3  0.0 - 0.7 K/uL   Basophils Relative 0  0 - 1 %   Basophils Absolute 0.0  0.0 - 0.1 K/uL  COMPREHENSIVE METABOLIC PANEL      Component Value Range   Sodium 139  135 - 145 mEq/L   Potassium 3.9  3.5 - 5.1 mEq/L   Chloride 105  96 - 112 mEq/L   CO2 24  19 - 32 mEq/L   Glucose, Bld 92  70 - 99 mg/dL   BUN 18  6 - 23 mg/dL   Creatinine, Ser 4.40  0.50 - 1.10 mg/dL   Calcium 8.9  8.4 - 10.2 mg/dL   Total Protein 7.7  6.0 - 8.3 g/dL   Albumin 3.8  3.5 - 5.2 g/dL   AST 44 (*) 0 - 37 U/L   ALT 82 (*) 0 - 35 U/L   Alkaline Phosphatase 80  39 - 117 U/L   Total Bilirubin 0.1 (*) 0.3 - 1.2 mg/dL   GFR calc non Af Amer >90  >90 mL/min   GFR calc Af Amer >90  >90 mL/min  LIPASE, BLOOD      Component Value Range   Lipase 58  11 - 59 U/L     1. Abdominal pain   2. Elevated transaminase level   3. Migraine headache       MDM  Headache which may be a migraine variant. Abdominal pain which is chronic for this patient. Prior records are reviewed and she has multiple ED visits for abdominal pain as well as occasional visits for abdominal pain issues. She had 7 visits in the month of June 11. Her abdominal exam is benign and I do not see any indication for imaging today. She will be given IV fluids, IV narcotics, IV antiemetics and reassessed.   She got good relief of her headache but only partial relief of her abdominal pain from the initial injection. Hydromorphone and  diphenhydramine will be repeated and she will be discharged. Of note, transaminases are slightly elevated. This will need to be followed as an outpatient.     Dione Booze, MD 01/10/12 [phone]

## 2012-01-10 NOTE — ED Notes (Signed)
Pt here c/o upper abd pain and vomiting x 6 days; pt seen for same multiple times in the past

## 2012-01-17 ENCOUNTER — Encounter (HOSPITAL_COMMUNITY): Payer: Self-pay | Admitting: Emergency Medicine

## 2012-01-17 ENCOUNTER — Emergency Department (HOSPITAL_COMMUNITY)
Admission: EM | Admit: 2012-01-17 | Discharge: 2012-01-18 | Disposition: A | Discharge status: Home / Self Care | Payer: Medicaid Other | Attending: Emergency Medicine | Admitting: Emergency Medicine

## 2012-01-17 DIAGNOSIS — R109 Unspecified abdominal pain: Secondary | ICD-10-CM | POA: Insufficient documentation

## 2012-01-17 DIAGNOSIS — R1110 Vomiting, unspecified: Secondary | ICD-10-CM | POA: Insufficient documentation

## 2012-01-17 DIAGNOSIS — Z79899 Other long term (current) drug therapy: Secondary | ICD-10-CM | POA: Insufficient documentation

## 2012-01-17 DIAGNOSIS — R197 Diarrhea, unspecified: Secondary | ICD-10-CM | POA: Insufficient documentation

## 2012-01-17 DIAGNOSIS — D219 Benign neoplasm of connective and other soft tissue, unspecified: Secondary | ICD-10-CM | POA: Insufficient documentation

## 2012-01-17 DIAGNOSIS — K859 Acute pancreatitis without necrosis or infection, unspecified: Secondary | ICD-10-CM

## 2012-01-17 DIAGNOSIS — G8929 Other chronic pain: Secondary | ICD-10-CM | POA: Insufficient documentation

## 2012-01-17 LAB — CBC WITH DIFFERENTIAL/PLATELET
Basophils Absolute: 0 10*3/uL (ref 0.0–0.1)
Basophils Relative: 1 % (ref 0–1)
Lymphocytes Relative: 34 % (ref 12–46)
MCHC: 33.6 g/dL (ref 30.0–36.0)
Neutro Abs: 4.3 10*3/uL (ref 1.7–7.7)
Neutrophils Relative %: 52 % (ref 43–77)
Platelets: 265 10*3/uL (ref 150–400)
RDW: 12.6 % (ref 11.5–15.5)
WBC: 8.3 10*3/uL (ref 4.0–10.5)

## 2012-01-17 LAB — COMPREHENSIVE METABOLIC PANEL WITH GFR
ALT: 143 U/L — ABNORMAL HIGH (ref 0–35)
AST: 166 U/L — ABNORMAL HIGH (ref 0–37)
Albumin: 4.2 g/dL (ref 3.5–5.2)
Alkaline Phosphatase: 123 U/L — ABNORMAL HIGH (ref 39–117)
BUN: 12 mg/dL (ref 6–23)
CO2: 27 meq/L (ref 19–32)
Calcium: 9.5 mg/dL (ref 8.4–10.5)
Chloride: 104 meq/L (ref 96–112)
Creatinine, Ser: 0.62 mg/dL (ref 0.50–1.10)
GFR calc Af Amer: >90 mL/min
GFR calc non Af Amer: >90 mL/min
Glucose, Bld: 92 mg/dL (ref 70–99)
Potassium: 3.7 meq/L (ref 3.5–5.1)
Sodium: 141 meq/L (ref 135–145)
Total Bilirubin: 0.5 mg/dL (ref 0.3–1.2)
Total Protein: 8.4 g/dL — ABNORMAL HIGH (ref 6.0–8.3)

## 2012-01-17 MED ORDER — DIPHENHYDRAMINE HCL 50 MG/ML IJ SOLN
12.5000 mg | Freq: Once | INTRAMUSCULAR | Status: AC
  Administered 2012-01-17: 12.5 mg via INTRAVENOUS
  Filled 2012-01-17: qty 1

## 2012-01-17 MED ORDER — SODIUM CHLORIDE 0.9 % IV BOLUS (SEPSIS)
1000.0000 mL | Freq: Once | INTRAVENOUS | Status: AC
  Administered 2012-01-17: 1000 mL via INTRAVENOUS

## 2012-01-17 MED ORDER — HYDROMORPHONE HCL PF 2 MG/ML IJ SOLN
2.0000 mg | Freq: Once | INTRAMUSCULAR | Status: AC
  Administered 2012-01-17: 2 mg via INTRAVENOUS
  Filled 2012-01-17: qty 1

## 2012-01-17 MED ORDER — PROMETHAZINE HCL 25 MG/ML IJ SOLN
25.0000 mg | Freq: Once | INTRAMUSCULAR | Status: AC
  Administered 2012-01-17: 25 mg via INTRAVENOUS
  Filled 2012-01-17: qty 1

## 2012-01-17 MED ORDER — HYDROMORPHONE HCL PF 1 MG/ML IJ SOLN
1.0000 mg | Freq: Once | INTRAMUSCULAR | Status: AC
  Administered 2012-01-17: 1 mg via INTRAVENOUS
  Filled 2012-01-17: qty 1

## 2012-01-17 MED ORDER — DIPHENHYDRAMINE HCL 50 MG/ML IJ SOLN
25.0000 mg | Freq: Once | INTRAMUSCULAR | Status: AC
  Administered 2012-01-17: 25 mg via INTRAVENOUS
  Filled 2012-01-17: qty 1

## 2012-01-17 NOTE — ED Notes (Signed)
IV attempt x2 unsuccessful. IV team paged 

## 2012-01-17 NOTE — ED Notes (Signed)
Pt c/o generalized abd pain and vomiting; pt seen here multiple times in past for same; pt sts unable to keep down meds

## 2012-01-17 NOTE — ED Notes (Signed)
Patient with increased abdominal pain with nausea, vomiting and diarrhea.  Patient states that she was here last week for same, increasingly getting worse.

## 2012-01-17 NOTE — ED Notes (Signed)
IV team returned paged and enroute to ED.

## 2012-01-17 NOTE — ED Notes (Signed)
Patient continues with pain.  Dr Anitra Lauth in with patient at this time.  New orders per Dr Anitra Lauth.

## 2012-01-17 NOTE — ED Provider Notes (Addendum)
History     CSN: [id number]  Arrival date & time 01/17/12  1818   First MD Initiated Contact with Patient 01/17/12 2125      Chief Complaint  Patient presents with  . Abdominal Pain  . Emesis    (Consider location/radiation/quality/duration/timing/severity/associated sxs/prior treatment) Patient is a 31 y.o. female presenting with abdominal pain and vomiting. The history is provided by the patient.  Abdominal Pain The primary symptoms of the illness include abdominal pain, nausea, vomiting and diarrhea. The primary symptoms of the illness do not include dysuria or vaginal discharge. Episode onset: 4 days ago. The onset of the illness was gradual. The problem has been gradually worsening.  The pain came on gradually. The abdominal pain has been gradually worsening since its onset. The abdominal pain is generalized. The abdominal pain does not radiate. The severity of the abdominal pain is 8/10. The abdominal pain is relieved by nothing. The abdominal pain is exacerbated by vomiting and eating.  The illness is associated with eating. The patient states that she believes she is currently not pregnant. The patient has had a change in bowel habit. Additional symptoms associated with the illness include anorexia. Symptoms associated with the illness do not include constipation. Associated medical issues comments: Chronic abdominal pain.  Emesis  Associated symptoms include abdominal pain and diarrhea.    Past Medical History  Diagnosis Date  . Pancreatitis   . S/P laparoscopic cholecystectomy   . Fibroids   . Ovarian cyst   . Chronic abdominal pain   . Anemia     Past Surgical History  Procedure Date  . Cholecystectomy   . Abdominal hysterectomy   . Dilation and curettage of uterus     Family History  Problem Relation Age of Onset  . Diabetes Mother   . Hypertension Mother   . Hypertension Father   . Diabetes Father   . Asthma Daughter     History  Substance Use Topics    . Smoking status: Current Everyday Smoker -- 0.5 packs/day for 10 years    Types: Cigarettes  . Smokeless tobacco: Never Used   Comment: declined  . Alcohol Use: No    OB History    Grav Para Term Preterm Abortions TAB SAB Ect Mult Living   4 2 1 1 2  2          Review of Systems  Gastrointestinal: Positive for nausea, vomiting, abdominal pain, diarrhea and anorexia. Negative for constipation.  Genitourinary: Negative for dysuria and vaginal discharge.  All other systems reviewed and are negative.    Allergies  Celecoxib; Ketorolac tromethamine; Meperidine hcl; Coconut flavor; Fentanyl; Fish allergy; Ibuprofen; Morphine; Propoxyphene-acetaminophen; and Tramadol hcl  Home Medications   Current Outpatient Rx  Name Route Sig Dispense Refill  . DIPHENHYDRAMINE HCL 50 MG PO CAPS Oral Take 50 mg by mouth every 6 (six) hours as needed. For itching    . ESTRADIOL 2 MG PO TABS Oral Take 2 mg by mouth at bedtime.    Marland Kitchen HYDROCODONE-ACETAMINOPHEN 5-325 MG PO TABS Oral Take 1 tablet by mouth every 6 (six) hours as needed. For pain    . ONDANSETRON HCL 4 MG PO TABS Oral Take 4 mg by mouth every 12 (twelve) hours as needed. For nausea    . PROMETHAZINE HCL 25 MG PO TABS Oral Take 25 mg by mouth every 6 (six) hours as needed. For nausea      BP 132/100  Pulse 77  Temp 98.8 F (37.1 C) (  Oral)  Resp 18  SpO2 100%  LMP 06/01/2011  Physical Exam  Nursing note and vitals reviewed. Constitutional: She is oriented to person, place, and time. She appears well-developed and well-nourished. No distress.  HENT:  Head: Normocephalic and atraumatic.  Mouth/Throat: Oropharynx is clear and moist.  Eyes: Conjunctivae and EOM are normal. Pupils are equal, round, and reactive to light.  Neck: Normal range of motion. Neck supple.  Cardiovascular: Normal rate, regular rhythm and intact distal pulses.   No murmur heard. Pulmonary/Chest: Effort normal and breath sounds normal. No respiratory distress.  She has no wheezes. She has no rales.  Abdominal: Soft. She exhibits no distension. There is generalized tenderness. There is no rebound, no guarding and no CVA tenderness.  Musculoskeletal: Normal range of motion. She exhibits no edema and no tenderness.  Neurological: She is alert and oriented to person, place, and time.  Skin: Skin is warm and dry. No rash noted. No erythema.  Psychiatric: She has a normal mood and affect. Her behavior is normal.    ED Course  Procedures (including critical care time)  Labs Reviewed  COMPREHENSIVE METABOLIC PANEL - Abnormal; Notable for the following:    Total Protein 8.4 (*)     AST 166 (*)     ALT 143 (*)     Alkaline Phosphatase 123 (*)     All other components within normal limits  LIPASE, BLOOD - Abnormal; Notable for the following:    Lipase 439 (*)     All other components within normal limits  CBC WITH DIFFERENTIAL  URINALYSIS, ROUTINE W REFLEX MICROSCOPIC   No results found.   1. Pancreatitis       MDM   Patient with chronic abdominal pain and intermittent pancreatitis who states for the last 3 or 4 days she's had worsening abdominal pain with diarrhea, nausea, vomiting. She states she's been unable to hold down her antibiotic. She also states that she ran out of her Vicodin and is supposed to be going back to see her doctor to get Dilaudid. Patient has normal vital signs today and mild diffuse abdominal tenderness but no localized tenderness. CBC is within normal limits. CMP, lipase her pending. Patient given IV fluids and anti-emetics but discussed with her that she did not receive any IV pain medication unless labs indicated a new acute pathology.  Also patient states that she is out of her Vicodin so her symptoms may be due to narcotic withdrawal.  10:11 PM Patient with signs of acute pancreatitis today with a lipase of 430 and elevated LFTs. Patient does not want to stay at once to have pain control and go home. Will attempt  supportive care but feel she will most likely need to be admitted.   11:43 PM Pt still in pain and given second dose of pain control.  Pt checked out to Dr. Judd Lien at 23:45  Gwyneth Sprout, MD 01/17/12 4098  Gwyneth Sprout, MD 01/17/12 [phone]

## 2012-01-18 ENCOUNTER — Encounter (HOSPITAL_COMMUNITY): Payer: Self-pay | Admitting: Emergency Medicine

## 2012-01-18 ENCOUNTER — Emergency Department (HOSPITAL_COMMUNITY)
Admission: EM | Admit: 2012-01-18 | Discharge: 2012-01-19 | Disposition: A | Discharge status: Home / Self Care | Payer: Medicaid Other | Attending: Emergency Medicine | Admitting: Emergency Medicine

## 2012-01-18 DIAGNOSIS — R109 Unspecified abdominal pain: Secondary | ICD-10-CM | POA: Insufficient documentation

## 2012-01-18 DIAGNOSIS — K859 Acute pancreatitis without necrosis or infection, unspecified: Secondary | ICD-10-CM

## 2012-01-18 DIAGNOSIS — D649 Anemia, unspecified: Secondary | ICD-10-CM | POA: Insufficient documentation

## 2012-01-18 DIAGNOSIS — R112 Nausea with vomiting, unspecified: Secondary | ICD-10-CM

## 2012-01-18 DIAGNOSIS — G8929 Other chronic pain: Secondary | ICD-10-CM | POA: Insufficient documentation

## 2012-01-18 DIAGNOSIS — F17200 Nicotine dependence, unspecified, uncomplicated: Secondary | ICD-10-CM | POA: Insufficient documentation

## 2012-01-18 LAB — COMPREHENSIVE METABOLIC PANEL
AST: 145 U/L — ABNORMAL HIGH (ref 0–37)
Albumin: 3.7 g/dL (ref 3.5–5.2)
Calcium: 9.1 mg/dL (ref 8.4–10.5)
Creatinine, Ser: 0.83 mg/dL (ref 0.50–1.10)
Total Protein: 7.5 g/dL (ref 6.0–8.3)

## 2012-01-18 LAB — URINE MICROSCOPIC-ADD ON

## 2012-01-18 LAB — CBC WITH DIFFERENTIAL/PLATELET
Eosinophils Absolute: 0.4 10*3/uL (ref 0.0–0.7)
Eosinophils Relative: 7 % — ABNORMAL HIGH (ref 0–5)
Hemoglobin: 12 g/dL (ref 12.0–15.0)
Lymphs Abs: 2.6 10*3/uL (ref 0.7–4.0)
MCH: 27.4 pg (ref 26.0–34.0)
MCV: 81.1 fL (ref 78.0–100.0)
Monocytes Relative: 9 % (ref 3–12)
RBC: 4.38 MIL/uL (ref 3.87–5.11)

## 2012-01-18 LAB — URINALYSIS, ROUTINE W REFLEX MICROSCOPIC
Glucose, UA: NEGATIVE mg/dL
Hgb urine dipstick: NEGATIVE
Ketones, ur: NEGATIVE mg/dL
pH: 7 (ref 5.0–8.0)

## 2012-01-18 LAB — LIPASE, BLOOD: Lipase: 73 U/L — ABNORMAL HIGH (ref 11–59)

## 2012-01-18 MED ORDER — OXYCODONE-ACETAMINOPHEN 5-325 MG PO TABS: 1.0000 | ORAL_TABLET | Freq: Four times a day (QID) | ORAL | Status: AC | PRN

## 2012-01-18 MED ORDER — DIPHENHYDRAMINE HCL 50 MG/ML IJ SOLN
25.0000 mg | Freq: Once | INTRAMUSCULAR | Status: AC
  Administered 2012-01-18: 25 mg via INTRAVENOUS
  Filled 2012-01-18: qty 1

## 2012-01-18 MED ORDER — HYDROMORPHONE HCL PF 1 MG/ML IJ SOLN
1.0000 mg | Freq: Once | INTRAMUSCULAR | Status: AC
  Administered 2012-01-18: 1 mg via INTRAVENOUS
  Filled 2012-01-18: qty 1

## 2012-01-18 MED ORDER — ONDANSETRON HCL 4 MG/2ML IJ SOLN
4.0000 mg | Freq: Once | INTRAMUSCULAR | Status: AC
  Administered 2012-01-18: 4 mg via INTRAVENOUS
  Filled 2012-01-18: qty 2

## 2012-01-18 MED ORDER — DIPHENHYDRAMINE HCL 50 MG/ML IJ SOLN
25.0000 mg | Freq: Once | INTRAMUSCULAR | Status: AC
  Administered 2012-01-19: 25 mg via INTRAVENOUS
  Filled 2012-01-18: qty 1

## 2012-01-18 MED ORDER — SODIUM CHLORIDE 0.9 % IV SOLN
1000.0000 mL | Freq: Once | INTRAVENOUS | Status: AC
  Administered 2012-01-18: 1000 mL via INTRAVENOUS

## 2012-01-18 MED ORDER — SODIUM CHLORIDE 0.9 % IV SOLN: 1000.0000 mL | INTRAVENOUS | Status: DC

## 2012-01-18 MED ORDER — HYDROMORPHONE HCL PF 1 MG/ML IJ SOLN
1.0000 mg | Freq: Once | INTRAMUSCULAR | Status: AC
  Administered 2012-01-19: 1 mg via INTRAVENOUS
  Filled 2012-01-18: qty 1

## 2012-01-18 NOTE — ED Provider Notes (Signed)
History     CSN: [id number]  Arrival date & time 01/18/12  2126   First MD Initiated Contact with Patient 01/18/12 2256      Chief Complaint  Patient presents with  . Abdominal Pain    (Consider location/radiation/quality/duration/timing/severity/associated sxs/prior treatment) Patient is a 31 y.o. female presenting with abdominal pain. The history is provided by the patient.  Abdominal Pain The primary symptoms of the illness include abdominal pain, nausea and vomiting. Primary symptoms comment: PT with chronic abd pain and Pancreatitis.  Presents to ed today complaining of continuing pain The current episode started more than 2 days ago. The onset of the illness was gradual. The problem has not changed since onset. Additional symptoms associated with the illness include back pain. Symptoms associated with the illness do not include chills, anorexia, diaphoresis, urgency, hematuria or frequency.    Past Medical History  Diagnosis Date  . Pancreatitis   . S/P laparoscopic cholecystectomy   . Fibroids   . Ovarian cyst   . Chronic abdominal pain   . Anemia     Past Surgical History  Procedure Date  . Cholecystectomy   . Abdominal hysterectomy   . Dilation and curettage of uterus     Family History  Problem Relation Age of Onset  . Diabetes Mother   . Hypertension Mother   . Hypertension Father   . Diabetes Father   . Asthma Daughter     History  Substance Use Topics  . Smoking status: Current Everyday Smoker -- 0.5 packs/day for 10 years    Types: Cigarettes  . Smokeless tobacco: Never Used   Comment: declined  . Alcohol Use: No    OB History    Grav Para Term Preterm Abortions TAB SAB Ect Mult Living   4 2 1 1 2  2          Review of Systems  Constitutional: Negative for chills and diaphoresis.  Gastrointestinal: Positive for nausea, vomiting and abdominal pain. Negative for anorexia.       + nausea  + vomiting + abd pain  Genitourinary: Negative for  urgency, frequency and hematuria.  Musculoskeletal: Positive for back pain.  All other systems reviewed and are negative.    Allergies  Celecoxib; Ketorolac tromethamine; Meperidine hcl; Coconut flavor; Fentanyl; Fish allergy; Ibuprofen; Morphine; Propoxyphene-acetaminophen; and Tramadol hcl  Home Medications   Current Outpatient Rx  Name Route Sig Dispense Refill  . DIPHENHYDRAMINE HCL 50 MG PO CAPS Oral Take 50 mg by mouth every 6 (six) hours as needed. For itching    . ESTRADIOL 2 MG PO TABS Oral Take 2 mg by mouth at bedtime.    Marland Kitchen HYDROCODONE-ACETAMINOPHEN 5-325 MG PO TABS Oral Take 1 tablet by mouth every 6 (six) hours as needed. For pain    . ONDANSETRON HCL 4 MG PO TABS Oral Take 4 mg by mouth every 12 (twelve) hours as needed. For nausea    . OXYCODONE-ACETAMINOPHEN 5-325 MG PO TABS Oral Take 1-2 tablets by mouth every 6 (six) hours as needed for pain. 15 tablet 0  . PROMETHAZINE HCL 25 MG PO TABS Oral Take 25 mg by mouth every 6 (six) hours as needed. For nausea      BP 119/79  Pulse 91  Temp 98.6 F (37 C) (Oral)  Resp 18  SpO2 99%  LMP 06/01/2011  Physical Exam  Constitutional: She is oriented to person, place, and time. She appears well-developed and well-nourished.  HENT:  Head: Normocephalic and atraumatic.  Eyes: Conjunctivae and EOM are normal. Pupils are equal, round, and reactive to light.  Neck: Normal range of motion.  Cardiovascular: Normal rate, regular rhythm and normal heart sounds.   Pulmonary/Chest: Effort normal and breath sounds normal.  Abdominal: Soft. Bowel sounds are normal. There is tenderness.       Soft.  Nonsurgical.  Mild epigastric tenderness  Musculoskeletal: Normal range of motion.  Neurological: She is alert and oriented to person, place, and time.  Skin: Skin is warm and dry.  Psychiatric: She has a normal mood and affect. Her behavior is normal.    ED Course  Procedures (including critical care time)  Labs Reviewed  LIPASE,  BLOOD - Abnormal; Notable for the following:    Lipase 73 (*)     All other components within normal limits  COMPREHENSIVE METABOLIC PANEL - Abnormal; Notable for the following:    AST 145 (*)     ALT 220 (*)     Total Bilirubin 0.2 (*)     All other components within normal limits  CBC WITH DIFFERENTIAL - Abnormal; Notable for the following:    HCT 35.5 (*)     Neutrophils Relative 40 (*)     Eosinophils Relative 7 (*)     All other components within normal limits  PREGNANCY, URINE   No results found.   No diagnosis found.    MDM  Pt presents with visit complaining of recurrent abd pain.  Chronic Hx of same in past.  Presented to ed yesterday with lipase of ~500.  Improved today.  Mild transaminitis.  Plan to analgesia,  Ivf,  Reassess.  Anticipate dc to fu with pmd.        Rosanne Ashing, MD 01/18/12 2328

## 2012-01-18 NOTE — ED Provider Notes (Signed)
Care assumed from Dr. Anitra Lauth.  I agree with her note assessment, and plan.  The patient is well-known to the ED for repeat ED visits for chronic pancreatits.  Today, her lipase is 500.  She was offered admission but refuses to stay due to child care issues.  She will be prescribed percocet, to follow up with her GI doctor.  I have instructed her to do this numerous times, however she never seems to follow this advice and returns here instead.    Geoffery Lyons, MD 01/18/12 [phone]

## 2012-01-18 NOTE — ED Notes (Signed)
Pt reports hx of pancreatitis, was here last night and was told she needed to be admitted,- went home and pain is worse and vomiting;

## 2012-01-19 NOTE — ED Notes (Signed)
PT. ASSISTED BY NT - [place] TO DISCHARGE ON A WHEELCHAIR.
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
## 2012-01-21 ENCOUNTER — Encounter (HOSPITAL_COMMUNITY): Payer: Self-pay

## 2012-01-21 ENCOUNTER — Emergency Department (HOSPITAL_COMMUNITY)
Admission: EM | Admit: 2012-01-21 | Discharge: 2012-01-21 | Disposition: A | Discharge status: Home / Self Care | Payer: Medicaid Other | Attending: Emergency Medicine | Admitting: Emergency Medicine

## 2012-01-21 DIAGNOSIS — G8929 Other chronic pain: Secondary | ICD-10-CM | POA: Insufficient documentation

## 2012-01-21 DIAGNOSIS — D649 Anemia, unspecified: Secondary | ICD-10-CM | POA: Insufficient documentation

## 2012-01-21 DIAGNOSIS — R1013 Epigastric pain: Secondary | ICD-10-CM | POA: Insufficient documentation

## 2012-01-21 DIAGNOSIS — F17200 Nicotine dependence, unspecified, uncomplicated: Secondary | ICD-10-CM | POA: Insufficient documentation

## 2012-01-21 LAB — CBC WITH DIFFERENTIAL/PLATELET
Eosinophils Absolute: 0.5 10*3/uL (ref 0.0–0.7)
Eosinophils Relative: 8 % — ABNORMAL HIGH (ref 0–5)
HCT: 37.9 % (ref 36.0–46.0)
Lymphs Abs: 2.3 10*3/uL (ref 0.7–4.0)
MCH: 27.3 pg (ref 26.0–34.0)
MCV: 80.1 fL (ref 78.0–100.0)
Monocytes Absolute: 0.4 10*3/uL (ref 0.1–1.0)
Platelets: 253 10*3/uL (ref 150–400)
RBC: 4.73 MIL/uL (ref 3.87–5.11)

## 2012-01-21 LAB — COMPREHENSIVE METABOLIC PANEL
ALT: 90 U/L — ABNORMAL HIGH (ref 0–35)
BUN: 13 mg/dL (ref 6–23)
Calcium: 9 mg/dL (ref 8.4–10.5)
Creatinine, Ser: 0.59 mg/dL (ref 0.50–1.10)
GFR calc Af Amer: >90 mL/min (ref >90)
GFR calc non Af Amer: >90 mL/min (ref >90)
Glucose, Bld: 93 mg/dL (ref 70–99)
Sodium: 142 mEq/L (ref 135–145)
Total Protein: 7.9 g/dL (ref 6.0–8.3)

## 2012-01-21 LAB — PREGNANCY, URINE: Preg Test, Ur: NEGATIVE

## 2012-01-21 LAB — URINALYSIS, ROUTINE W REFLEX MICROSCOPIC
Leukocytes, UA: NEGATIVE
Nitrite: NEGATIVE
Protein, ur: NEGATIVE mg/dL
Specific Gravity, Urine: 1.029 (ref 1.005–1.030)
Urobilinogen, UA: 0.2 mg/dL (ref 0.0–1.0)

## 2012-01-21 LAB — LIPASE, BLOOD: Lipase: 67 U/L — ABNORMAL HIGH (ref 11–59)

## 2012-01-21 MED ORDER — ONDANSETRON HCL 4 MG/2ML IJ SOLN
4.0000 mg | Freq: Once | INTRAMUSCULAR | Status: AC
  Administered 2012-01-21: 4 mg via INTRAVENOUS
  Filled 2012-01-21: qty 2

## 2012-01-21 MED ORDER — HYDROMORPHONE HCL PF 1 MG/ML IJ SOLN
1.0000 mg | Freq: Once | INTRAMUSCULAR | Status: AC
  Administered 2012-01-21: 1 mg via INTRAVENOUS
  Filled 2012-01-21: qty 1

## 2012-01-21 MED ORDER — DIPHENHYDRAMINE HCL 50 MG/ML IJ SOLN
12.5000 mg | Freq: Once | INTRAMUSCULAR | Status: AC
  Administered 2012-01-21: 12.5 mg via INTRAVENOUS
  Filled 2012-01-21: qty 1

## 2012-01-21 MED ORDER — PERCOCET 5-325 MG PO TABS: 1.0000 | ORAL_TABLET | Freq: Four times a day (QID) | ORAL | Status: DC | PRN

## 2012-01-21 MED ORDER — DIPHENHYDRAMINE HCL 25 MG PO CAPS
25.0000 mg | ORAL_CAPSULE | Freq: Once | ORAL | Status: DC
  Filled 2012-01-21: qty 1

## 2012-01-21 MED ORDER — PROMETHAZINE HCL 25 MG/ML IJ SOLN
25.0000 mg | Freq: Four times a day (QID) | INTRAMUSCULAR | Status: DC | PRN
  Filled 2012-01-21: qty 1

## 2012-01-21 MED ORDER — SODIUM CHLORIDE 0.9 % IV BOLUS (SEPSIS)
1000.0000 mL | Freq: Once | INTRAVENOUS | Status: AC
  Administered 2012-01-21: 1000 mL via INTRAVENOUS

## 2012-01-21 MED ORDER — HYDROMORPHONE HCL PF 1 MG/ML IJ SOLN
0.5000 mg | Freq: Once | INTRAMUSCULAR | Status: AC
  Administered 2012-01-21: 0.5 mg via INTRAVENOUS
  Filled 2012-01-21: qty 1

## 2012-01-21 NOTE — ED Provider Notes (Signed)
History     CSN: [id number]  Arrival date & time 01/21/12  1024   First MD Initiated Contact with Patient 01/21/12 1037      Chief Complaint  Patient presents with  . Abdominal Pain    (Consider location/radiation/quality/duration/timing/severity/associated sxs/prior treatment) HPI Comments: Patient with chronic pancreatitis presents to ED for third visit this week with epigastric pain, N/V/D that feels like her pancreatitis.  Pain is epigastric and radiates down to her lower back.  States this feels like her pancreatitis. States she is unable to keep down any of her home medications. Pt was seen 7/15 and 7/16 for similar symptoms, found to have elevated lipase.  Denies fevers, urinary or vaginal symptoms.   Patient is a 31 y.o. female presenting with abdominal pain. The history is provided by the patient.  Abdominal Pain The primary symptoms of the illness include abdominal pain, nausea, vomiting and diarrhea. The primary symptoms of the illness do not include fever, shortness of breath, dysuria, vaginal discharge or vaginal bleeding.  Symptoms associated with the illness do not include chills or urgency.    Past Medical History  Diagnosis Date  . Pancreatitis   . S/P laparoscopic cholecystectomy   . Fibroids   . Ovarian cyst   . Chronic abdominal pain   . Anemia     Past Surgical History  Procedure Date  . Cholecystectomy   . Abdominal hysterectomy   . Dilation and curettage of uterus     Family History  Problem Relation Age of Onset  . Diabetes Mother   . Hypertension Mother   . Hypertension Father   . Diabetes Father   . Asthma Daughter     History  Substance Use Topics  . Smoking status: Current Everyday Smoker -- 0.5 packs/day for 10 years    Types: Cigarettes  . Smokeless tobacco: Never Used   Comment: declined  . Alcohol Use: No    OB History    Grav Para Term Preterm Abortions TAB SAB Ect Mult Living   4 2 1 1 2  2          Review of Systems    Constitutional: Negative for fever and chills.  Respiratory: Negative for shortness of breath.   Cardiovascular: Negative for chest pain.  Gastrointestinal: Positive for nausea, vomiting, abdominal pain and diarrhea.  Genitourinary: Negative for dysuria, urgency, flank pain, vaginal bleeding, vaginal discharge and menstrual problem.    Allergies  Celecoxib; Ketorolac tromethamine; Meperidine hcl; Coconut flavor; Fentanyl; Fish allergy; Ibuprofen; Morphine; Propoxyphene-acetaminophen; and Tramadol hcl  Home Medications   Current Outpatient Rx  Name Route Sig Dispense Refill  . DIPHENHYDRAMINE HCL 50 MG PO CAPS Oral Take 50 mg by mouth every 6 (six) hours as needed. For itching    . ESTRADIOL 2 MG PO TABS Oral Take 2 mg by mouth at bedtime.    Marland Kitchen ONDANSETRON HCL 4 MG PO TABS Oral Take 4 mg by mouth every 12 (twelve) hours as needed. For nausea    . OXYCODONE-ACETAMINOPHEN 5-325 MG PO TABS Oral Take 1-2 tablets by mouth every 6 (six) hours as needed for pain. 15 tablet 0  . PROMETHAZINE HCL 25 MG PO TABS Oral Take 25 mg by mouth every 6 (six) hours as needed. For nausea      BP 130/95  Pulse 80  Temp 98.6 F (37 C) (Oral)  Resp 16  SpO2 99%  LMP 06/01/2011  Physical Exam  Nursing note and vitals reviewed. Constitutional: She is oriented to  person, place, and time. She appears well-developed and well-nourished. No distress.  HENT:  Head: Normocephalic and atraumatic.  Neck: Neck supple.  Cardiovascular: Normal rate, regular rhythm and normal heart sounds.   Pulmonary/Chest: Breath sounds normal. No respiratory distress. She has no wheezes. She has no rales. She exhibits no tenderness.  Abdominal: Soft. Bowel sounds are normal. She exhibits no distension and no mass. There is tenderness in the epigastric area and left upper quadrant. There is no rebound, no guarding and no CVA tenderness.  Neurological: She is alert and oriented to person, place, and time.  Skin: She is not  diaphoretic.    ED Course  Procedures (including critical care time)  Labs Reviewed  CBC WITH DIFFERENTIAL - Abnormal; Notable for the following:    Eosinophils Relative 8 (*)     All other components within normal limits  COMPREHENSIVE METABOLIC PANEL - Abnormal; Notable for the following:    ALT 90 (*)     Total Bilirubin 0.2 (*)     All other components within normal limits  LIPASE, BLOOD - Abnormal; Notable for the following:    Lipase 67 (*)     All other components within normal limits  URINALYSIS, ROUTINE W REFLEX MICROSCOPIC - Abnormal; Notable for the following:    APPearance CLOUDY (*)     All other components within normal limits  PREGNANCY, URINE   No results found.  12:46 PM Patient reports continued pain and nausea.  States she is unable to take PO benadryl because of ongoing nausea.  Have ordered additional pain and nausea medication.    3:28 PM Patient reports continued improvement of pain, nausea, and itching.  Patient is in no distress, comfortable appearing, sitting on bed feeding baby, talking with family, playing games on her iphone.    4:02 PM Patient is tolerating PO.    1. Chronic abdominal pain       MDM  Patient with hx pancreatitis, frequent visits for same, presents with typical epigastric pain, N/V.  No e/o vomiting or discomfort while in ED.  Labs show lipase continues to improve.  Labs otherwise unremarkable.  Patient's symptoms improved in ED, tolerating PO on discharge.  Plan is for d/c home with PCP follow up, #5 percocet given as patient states she only has a few left.  Pt has multiple nausea medications at home.  Return precautions given.  Patient verbalizes understanding and agrees with plan.          Dillard Cannon [facility]) [place], [place] 01/21/12 [phone]

## 2012-01-21 NOTE — ED Notes (Signed)
sl

## 2012-01-21 NOTE — ED Notes (Signed)
Pt asked for urine sample unable to provide at this time.  

## 2012-01-21 NOTE — ED Notes (Signed)
Pt here for pancreatitis, sts here 2 days ago and elevated enzymes.,

## 2012-01-22 NOTE — ED Provider Notes (Signed)
Medical screening examination/treatment/procedure(s) were performed by non-physician practitioner and as supervising physician I was immediately available for consultation/collaboration.   Laray Anger, DO 01/22/12 1018

## 2012-01-23 ENCOUNTER — Emergency Department (HOSPITAL_COMMUNITY)
Admission: EM | Admit: 2012-01-23 | Discharge: 2012-01-24 | Disposition: A | Discharge status: Home / Self Care | Payer: Medicaid Other | Attending: Emergency Medicine | Admitting: Emergency Medicine

## 2012-01-23 DIAGNOSIS — R7989 Other specified abnormal findings of blood chemistry: Secondary | ICD-10-CM | POA: Insufficient documentation

## 2012-01-23 DIAGNOSIS — R112 Nausea with vomiting, unspecified: Secondary | ICD-10-CM | POA: Insufficient documentation

## 2012-01-23 DIAGNOSIS — R1013 Epigastric pain: Secondary | ICD-10-CM | POA: Insufficient documentation

## 2012-01-23 DIAGNOSIS — G8929 Other chronic pain: Secondary | ICD-10-CM | POA: Insufficient documentation

## 2012-01-24 LAB — DIFFERENTIAL
Basophils Absolute: 0 10*3/uL (ref 0.0–0.1)
Basophils Relative: 1 % (ref 0–1)
Lymphocytes Relative: 42 % (ref 12–46)
Neutro Abs: 2.9 10*3/uL (ref 1.7–7.7)
Neutrophils Relative %: 43 % (ref 43–77)

## 2012-01-24 LAB — CBC
HCT: 35.9 % — ABNORMAL LOW (ref 36.0–46.0)
MCH: 26.9 pg (ref 26.0–34.0)
MCV: 80 fL (ref 78.0–100.0)
Platelets: 268 10*3/uL (ref 150–400)
RDW: 12.8 % (ref 11.5–15.5)
WBC: 6.7 10*3/uL (ref 4.0–10.5)

## 2012-01-24 LAB — COMPREHENSIVE METABOLIC PANEL
AST: 94 U/L — ABNORMAL HIGH (ref 0–37)
Albumin: 3.7 g/dL (ref 3.5–5.2)
BUN: 12 mg/dL (ref 6–23)
CO2: 23 mEq/L (ref 19–32)
Calcium: 9.1 mg/dL (ref 8.4–10.5)
Chloride: 105 mEq/L (ref 96–112)
Creatinine, Ser: 0.62 mg/dL (ref 0.50–1.10)
GFR calc non Af Amer: >90 mL/min (ref >90)
Total Bilirubin: 0.2 mg/dL — ABNORMAL LOW (ref 0.3–1.2)

## 2012-01-24 NOTE — ED Notes (Signed)
See downtime charting. 

## 2012-01-25 ENCOUNTER — Encounter (HOSPITAL_COMMUNITY): Payer: Self-pay | Admitting: Emergency Medicine

## 2012-01-25 DIAGNOSIS — G8929 Other chronic pain: Secondary | ICD-10-CM | POA: Insufficient documentation

## 2012-01-25 DIAGNOSIS — F17200 Nicotine dependence, unspecified, uncomplicated: Secondary | ICD-10-CM | POA: Insufficient documentation

## 2012-01-25 DIAGNOSIS — D649 Anemia, unspecified: Secondary | ICD-10-CM | POA: Insufficient documentation

## 2012-01-25 NOTE — ED Notes (Signed)
Pt sent to ED from Dr Logan Bores  Office d/t abnormal labs and pain

## 2012-01-26 ENCOUNTER — Emergency Department (HOSPITAL_COMMUNITY)
Admission: EM | Admit: 2012-01-26 | Discharge: 2012-01-26 | Disposition: A | Discharge status: Home / Self Care | Payer: Medicaid Other | Attending: Emergency Medicine | Admitting: Emergency Medicine

## 2012-01-26 DIAGNOSIS — G8929 Other chronic pain: Secondary | ICD-10-CM

## 2012-01-26 LAB — LIPASE, BLOOD: Lipase: 91 U/L — ABNORMAL HIGH (ref 11–59)

## 2012-01-26 MED ORDER — HYDROMORPHONE HCL PF 1 MG/ML IJ SOLN
1.0000 mg | Freq: Once | INTRAMUSCULAR | Status: AC
  Administered 2012-01-26: 1 mg via INTRAVENOUS
  Filled 2012-01-26: qty 1

## 2012-01-26 MED ORDER — ONDANSETRON HCL 4 MG PO TABS: 4.0000 mg | ORAL_TABLET | Freq: Four times a day (QID) | ORAL | Status: AC

## 2012-01-26 MED ORDER — DIPHENHYDRAMINE HCL 50 MG/ML IJ SOLN
50.0000 mg | Freq: Once | INTRAMUSCULAR | Status: AC
  Administered 2012-01-26: 50 mg via INTRAMUSCULAR
  Filled 2012-01-26: qty 1

## 2012-01-26 MED ORDER — SODIUM CHLORIDE 0.9 % IV BOLUS (SEPSIS)
1000.0000 mL | Freq: Once | INTRAVENOUS | Status: AC
  Administered 2012-01-26: 1000 mL via INTRAVENOUS

## 2012-01-26 MED ORDER — PROMETHAZINE HCL 25 MG/ML IJ SOLN
25.0000 mg | Freq: Once | INTRAMUSCULAR | Status: AC
  Administered 2012-01-26: 25 mg via INTRAMUSCULAR
  Filled 2012-01-26: qty 1

## 2012-01-26 MED FILL — Metoclopramide HCl Inj 5 MG/ML (Base Equivalent): INTRAMUSCULAR | Qty: 2 | Status: AC

## 2012-01-26 MED FILL — Hydromorphone HCl Preservative Free (PF) Inj 2 MG/ML: INTRAMUSCULAR | Qty: 1 | Status: AC

## 2012-01-26 MED FILL — Diphenhydramine HCl Inj 50 MG/ML: INTRAMUSCULAR | Qty: 1 | Status: AC

## 2012-01-26 NOTE — ED Notes (Signed)
Pt requesting to see a Dr so she can get some pain med. Reassured pt that Dr should be in shortly to see her.

## 2012-01-26 NOTE — ED Notes (Signed)
Went in to d/c patient. Pt upset that she is being discharged and is requesting additional pain medications. EDP in to see the patient. Pt states that the medications that she has at home for pain & nausea doesn't help and she is unable to keep anything down. EDP ordered Dilaudid 1 mg IVP x 1 and pt is to be d/c'd home afterwards. Pt ok with plan and verbalized understanding. After medication was administered, pt then requested a cup of ice, coke, and peanut butter crackers.

## 2012-01-26 NOTE — ED Notes (Signed)
Patient is here for the 5th time this month alone with c/o abdominal pain/pancreatitis. Patient is demanding to see a physician and wants pain medication. Apologized to the patient for the delay and explained that the doctor will be in to see her as soon as possible. Patient given 2 warm blankets.

## 2012-01-26 NOTE — ED Provider Notes (Signed)
History     CSN: [id number]  Arrival date & time 01/25/12  2135   First MD Initiated Contact with Patient 01/26/12 0044      Chief Complaint  Patient presents with  . Abdominal Pain    (Consider location/radiation/quality/duration/timing/severity/associated sxs/prior treatment) HPI History provided by patient. Has chronic pancreatitis and chronic pain. Has multiple recent ED visits for the same. Patient states she was at her doctor's office today and told to come the emergency department because she is unable to hold anything down with nausea vomiting and diarrhea. States she cannot take Phenergan suppositories due to diarrhea. She is requesting an IV with IV fluids and IV narcotic pain medications. She is here with her sister who is caring for her children bedside. No fevers or chills. No recent travel. No recent antibiotics. No rashes. No sick contacts at home. Has had her gallbladder taken out previously. She denies any alcohol or known factors that aggravate her pancreatitis. Pain is sharp located left upper quadrant abdomen and not radiating. Past Medical History  Diagnosis Date  . Pancreatitis   . S/P laparoscopic cholecystectomy   . Fibroids   . Ovarian cyst   . Chronic abdominal pain   . Anemia     Past Surgical History  Procedure Date  . Cholecystectomy   . Abdominal hysterectomy   . Dilation and curettage of uterus     Family History  Problem Relation Age of Onset  . Diabetes Mother   . Hypertension Mother   . Hypertension Father   . Diabetes Father   . Asthma Daughter     History  Substance Use Topics  . Smoking status: Current Everyday Smoker -- 0.5 packs/day for 10 years    Types: Cigarettes  . Smokeless tobacco: Never Used   Comment: declined  . Alcohol Use: No    OB History    Grav Para Term Preterm Abortions TAB SAB Ect Mult Living   4 2 1 1 2  2          Review of Systems  Constitutional: Negative for fever and chills.  HENT: Negative for  neck pain and neck stiffness.   Eyes: Negative for pain.  Respiratory: Negative for shortness of breath.   Cardiovascular: Negative for chest pain.  Gastrointestinal: Positive for nausea and abdominal pain.  Genitourinary: Negative for dysuria.  Musculoskeletal: Negative for back pain.  Skin: Negative for rash.  Neurological: Negative for headaches.  All other systems reviewed and are negative.    Allergies  Celecoxib; Ketorolac tromethamine; Meperidine hcl; Coconut flavor; Fentanyl; Fish allergy; Ibuprofen; Morphine; Propoxyphene-acetaminophen; and Tramadol hcl  Home Medications   Current Outpatient Rx  Name Route Sig Dispense Refill  . DIPHENHYDRAMINE HCL 50 MG PO CAPS Oral Take 50 mg by mouth every 6 (six) hours as needed. For itching    . ESTRADIOL 2 MG PO TABS Oral Take 2 mg by mouth at bedtime.    . OXYCODONE-ACETAMINOPHEN 5-325 MG PO TABS Oral Take 1-2 tablets by mouth every 6 (six) hours as needed for pain. 15 tablet 0  . PROMETHAZINE HCL 25 MG PO TABS Oral Take 25 mg by mouth every 6 (six) hours as needed. For nausea      BP 126/84  Pulse 79  Temp 98.7 F (37.1 C)  Resp 18  SpO2 97%  LMP 06/01/2011  Physical Exam  Constitutional: She is oriented to person, place, and time. She appears well-developed and well-nourished.  HENT:  Head: Normocephalic and atraumatic.  Eyes:  Conjunctivae and EOM are normal. Pupils are equal, round, and reactive to light.  Neck: Trachea normal. Neck supple. No thyromegaly present.  Cardiovascular: Normal rate, regular rhythm, S1 normal, S2 normal and normal pulses.     No systolic murmur is present   No diastolic murmur is present  Pulses:      Radial pulses are 2+ on the right side, and 2+ on the left side.  Pulmonary/Chest: Effort normal and breath sounds normal. She has no wheezes. She has no rhonchi. She has no rales. She exhibits no tenderness.  Abdominal: Soft. Normal appearance and bowel sounds are normal. There is no CVA  tenderness and negative Murphy's sign.       Tender left upper quadrant without rebound or guarding or peritonitis  Musculoskeletal:       BLE:s Calves nontender, no cords or erythema, negative Homans sign  Neurological: She is alert and oriented to person, place, and time. She has normal strength. No cranial nerve deficit or sensory deficit. GCS eye subscore is 4. GCS verbal subscore is 5. GCS motor subscore is 6.  Skin: Skin is warm and dry. No rash noted. She is not diaphoretic.  Psychiatric: Her speech is normal.       Cooperative and appropriate    ED Course  Procedures (including critical care time)  Results for orders placed during the hospital encounter of 01/26/12  LIPASE, BLOOD      Component Value Range   Lipase 91 (*) 11 - 59 U/L    IV fluids. IV Dilaudid. IM Phenergan and IM Benadryl  PO fluid challenge. Able to tolerate fluids.  Prolonged period of observation in the emergency department without emesis in the ED  MDM   Chronic pain.  Lipase reviewed as above. Old records reviewed. Nursing notes reviewed. IV medications and IV fluids provided as above. No clinical dehydration and stable for discharge home. Plan post primary care followup.        Sunnie Nielsen, MD 01/26/12 [phone]

## 2012-01-31 ENCOUNTER — Encounter (HOSPITAL_COMMUNITY): Payer: Self-pay | Admitting: *Deleted

## 2012-01-31 ENCOUNTER — Emergency Department (HOSPITAL_COMMUNITY)
Admission: EM | Admit: 2012-01-31 | Discharge: 2012-02-01 | Disposition: A | Discharge status: Home / Self Care | Payer: Medicaid Other | Attending: Emergency Medicine | Admitting: Emergency Medicine

## 2012-01-31 DIAGNOSIS — K861 Other chronic pancreatitis: Secondary | ICD-10-CM

## 2012-01-31 DIAGNOSIS — F17200 Nicotine dependence, unspecified, uncomplicated: Secondary | ICD-10-CM | POA: Insufficient documentation

## 2012-01-31 DIAGNOSIS — R1013 Epigastric pain: Secondary | ICD-10-CM | POA: Insufficient documentation

## 2012-01-31 DIAGNOSIS — G8929 Other chronic pain: Secondary | ICD-10-CM | POA: Insufficient documentation

## 2012-01-31 DIAGNOSIS — R109 Unspecified abdominal pain: Secondary | ICD-10-CM

## 2012-01-31 DIAGNOSIS — R112 Nausea with vomiting, unspecified: Secondary | ICD-10-CM

## 2012-01-31 LAB — CBC
HCT: 35.9 % — ABNORMAL LOW (ref 36.0–46.0)
Hemoglobin: 12.3 g/dL (ref 12.0–15.0)
MCV: 81 fL (ref 78.0–100.0)
RBC: 4.43 MIL/uL (ref 3.87–5.11)
RDW: 12.8 % (ref 11.5–15.5)
WBC: 6.6 10*3/uL (ref 4.0–10.5)

## 2012-01-31 MED ORDER — DIPHENHYDRAMINE HCL 50 MG/ML IJ SOLN
25.0000 mg | Freq: Once | INTRAMUSCULAR | Status: AC
Start: 2012-01-31 — End: 2012-01-31
  Administered 2012-01-31: 25 mg via INTRAVENOUS
  Filled 2012-01-31: qty 1

## 2012-01-31 MED ORDER — ONDANSETRON HCL 4 MG/2ML IJ SOLN
4.0000 mg | Freq: Once | INTRAMUSCULAR | Status: AC
  Administered 2012-01-31: 4 mg via INTRAVENOUS
  Filled 2012-01-31: qty 2

## 2012-01-31 MED ORDER — SODIUM CHLORIDE 0.9 % IV BOLUS (SEPSIS)
500.0000 mL | Freq: Once | INTRAVENOUS | Status: AC
  Administered 2012-01-31: 500 mL via INTRAVENOUS

## 2012-01-31 MED ORDER — HYDROMORPHONE HCL PF 1 MG/ML IJ SOLN
1.0000 mg | Freq: Once | INTRAMUSCULAR | Status: AC
  Administered 2012-01-31: 1 mg via INTRAVENOUS
  Filled 2012-01-31: qty 1

## 2012-01-31 NOTE — ED Provider Notes (Signed)
History     CSN: [id number]  Arrival date & time 01/31/12  1949   First MD Initiated Contact with Patient 01/31/12 2245      Chief Complaint  Patient presents with  . Abdominal Pain    (Consider location/radiation/quality/duration/timing/severity/associated sxs/prior treatment) HPI Comments: 31 y/o female with history of chronic pancreatitis presents with worsening abdominal pain since last ED visit last week. States it feels like her normal acute pancreatitis episode. Admits to nausea and vomiting. Denies any fever, but feels as if she has chills. She is unable to keep any food or drink down. Also unable to keep her medications down. Called her PCP who told her to come to ED. Describes pain as dull 10/10 with occasional sharp pains radiating to her back rated 10/10. Nothing is helping her pain since she cannot keep anything down. Denies any chest pain, shortness of breath, urinary s/s, pelvic pain.  Patient is a 31 y.o. female presenting with abdominal pain. The history is provided by the patient.  Abdominal Pain The primary symptoms of the illness include abdominal pain, nausea and vomiting. The primary symptoms of the illness do not include fever or shortness of breath.  Additional symptoms associated with the illness include chills.    Past Medical History  Diagnosis Date  . Pancreatitis   . S/P laparoscopic cholecystectomy   . Fibroids   . Ovarian cyst   . Chronic abdominal pain   . Anemia     Past Surgical History  Procedure Date  . Cholecystectomy   . Abdominal hysterectomy   . Dilation and curettage of uterus     Family History  Problem Relation Age of Onset  . Diabetes Mother   . Hypertension Mother   . Hypertension Father   . Diabetes Father   . Asthma Daughter     History  Substance Use Topics  . Smoking status: Current Everyday Smoker -- 0.5 packs/day for 10 years    Types: Cigarettes  . Smokeless tobacco: Never Used   Comment: declined  . Alcohol  Use: No    OB History    Grav Para Term Preterm Abortions TAB SAB Ect Mult Living   4 2 1 1 2  2          Review of Systems  Constitutional: Positive for chills. Negative for fever.  Respiratory: Negative for shortness of breath.   Cardiovascular: Negative for chest pain.  Gastrointestinal: Positive for nausea, vomiting and abdominal pain.  Genitourinary: Negative for pelvic pain.    Allergies  Celecoxib; Ketorolac tromethamine; Meperidine hcl; Coconut flavor; Fentanyl; Fish allergy; Ibuprofen; Morphine; Propoxyphene-acetaminophen; and Tramadol hcl  Home Medications   Current Outpatient Rx  Name Route Sig Dispense Refill  . DIPHENHYDRAMINE HCL 50 MG PO CAPS Oral Take 50 mg by mouth every 6 (six) hours as needed. For itching    . ESTRADIOL 2 MG PO TABS Oral Take 2 mg by mouth at bedtime.    Marland Kitchen ONDANSETRON HCL 4 MG PO TABS Oral Take 1 tablet (4 mg total) by mouth every 6 (six) hours. 12 tablet 0  . PROMETHAZINE HCL 25 MG PO TABS Oral Take 25 mg by mouth every 6 (six) hours as needed. For nausea      BP 124/78  Pulse 78  Temp 98.6 F (37 C) (Oral)  Resp 18  SpO2 100%  LMP 06/01/2011  Physical Exam  Nursing note and vitals reviewed. Constitutional: She is oriented to person, place, and time. She appears well-developed and well-nourished.  No distress.  HENT:  Head: Normocephalic and atraumatic.  Mouth/Throat: Oropharynx is clear and moist.  Eyes: Conjunctivae and EOM are normal. Pupils are equal, round, and reactive to light.  Neck: Neck supple.  Abdominal: Soft. Bowel sounds are normal. She exhibits no mass. There is tenderness in the epigastric area. There is no rigidity, no rebound, no guarding and no CVA tenderness.  Neurological: She is alert and oriented to person, place, and time.  Skin: Skin is warm and dry. She is not diaphoretic.  Psychiatric: She has a normal mood and affect. Her behavior is normal.    ED Course  Procedures (including critical care  time)   Labs Reviewed  CBC  LIPASE, BLOOD  CBC WITH DIFFERENTIAL  COMPREHENSIVE METABOLIC PANEL  LIPASE, BLOOD   No results found.   No diagnosis found.    MDM  12:12 AM Patient states she is still nauseous and in pain. Asking for more pain and nausea medication. Told her we needed to wait for her labs to come back. Significant other asked how long that would take so they can go home.  12:55 AM Awaiting more lab results. Lipase normal. If the rest of labs are normal, discharge patient and advise f/u with PCP. Patient has pain medication and anti-nausea medicine at home already. Case discussed with Drucie Opitz, PA-C who will follow the rest of patient ER care.     Trevor Mace, PA-C 02/01/12 1610

## 2012-01-31 NOTE — ED Notes (Signed)
Patient with chronic history of pancreatitis and states that she is having a flare up.  Called MD and was told to come in for evaluation

## 2012-02-01 LAB — DIFFERENTIAL
Basophils Relative: 0 % (ref 0–1)
Eosinophils Absolute: 0.2 10*3/uL (ref 0.0–0.7)
Eosinophils Relative: 3 % (ref 0–5)
Lymphs Abs: 2.6 10*3/uL (ref 0.7–4.0)
Neutrophils Relative %: 52 % (ref 43–77)

## 2012-02-01 LAB — COMPREHENSIVE METABOLIC PANEL
ALT: 114 U/L — ABNORMAL HIGH (ref 0–35)
Alkaline Phosphatase: 93 U/L (ref 39–117)
BUN: 12 mg/dL (ref 6–23)
CO2: 25 mEq/L (ref 19–32)
Chloride: 104 mEq/L (ref 96–112)
GFR calc Af Amer: >90 mL/min (ref >90)
GFR calc non Af Amer: >90 mL/min (ref >90)
Glucose, Bld: 102 mg/dL — ABNORMAL HIGH (ref 70–99)
Potassium: 3.8 mEq/L (ref 3.5–5.1)
Sodium: 139 mEq/L (ref 135–145)
Total Bilirubin: 0.4 mg/dL (ref 0.3–1.2)
Total Protein: 7.6 g/dL (ref 6.0–8.3)

## 2012-02-01 MED ORDER — DIPHENHYDRAMINE HCL 50 MG/ML IJ SOLN
25.0000 mg | Freq: Once | INTRAMUSCULAR | Status: AC
  Administered 2012-02-01: 25 mg via INTRAVENOUS
  Filled 2012-02-01: qty 1

## 2012-02-01 NOTE — ED Provider Notes (Signed)
Medical screening examination/treatment/procedure(s) were performed by non-physician practitioner and as supervising physician I was immediately available for consultation/collaboration.  Lennox Leikam M Johnross Nabozny, MD 02/01/12 0548 

## 2012-02-01 NOTE — ED Notes (Signed)
Patient was given another dose of Benadryl prior to discharge after speaking with the ED physician. Patient c/o have severe itching all over. Patient was given discharge instructions and released from the Ed in stable condition.

## 2012-02-01 NOTE — ED Notes (Signed)
Received report and now resuming care. Patient states that she continues to have abd pain. No relief from previous medications. States that it usually takes 2mg  of Dilaudid.

## 2012-02-03 ENCOUNTER — Encounter (HOSPITAL_COMMUNITY): Payer: Self-pay | Admitting: Adult Health

## 2012-02-03 ENCOUNTER — Emergency Department (HOSPITAL_COMMUNITY)
Admission: EM | Admit: 2012-02-03 | Discharge: 2012-02-04 | Disposition: A | Discharge status: Home / Self Care | Payer: Medicaid Other | Attending: Emergency Medicine | Admitting: Emergency Medicine

## 2012-02-03 DIAGNOSIS — F17200 Nicotine dependence, unspecified, uncomplicated: Secondary | ICD-10-CM | POA: Insufficient documentation

## 2012-02-03 DIAGNOSIS — G8929 Other chronic pain: Secondary | ICD-10-CM | POA: Insufficient documentation

## 2012-02-03 DIAGNOSIS — R109 Unspecified abdominal pain: Secondary | ICD-10-CM

## 2012-02-03 DIAGNOSIS — D649 Anemia, unspecified: Secondary | ICD-10-CM | POA: Insufficient documentation

## 2012-02-03 DIAGNOSIS — K861 Other chronic pancreatitis: Secondary | ICD-10-CM | POA: Insufficient documentation

## 2012-02-03 NOTE — ED Notes (Signed)
Reports chronic pancreatitis  that began last Sunday associated with headaches. Pt states she gets bad headaches when her liver enzymes are elevated.

## 2012-02-03 NOTE — ED Notes (Signed)
Pt c/o RUQ pain radiating to back x 1 and 1/2 weeks. States treated for same this past Sunday. States she has vomited "more times than she can count" in last 24 hours. Reports pain is typical of pancreatitis. Reports no relief from pain meds prescribed.

## 2012-02-04 LAB — COMPREHENSIVE METABOLIC PANEL
ALT: 65 U/L — ABNORMAL HIGH (ref 0–35)
AST: 40 U/L — ABNORMAL HIGH (ref 0–37)
Alkaline Phosphatase: 95 U/L (ref 39–117)
CO2: 22 mEq/L (ref 19–32)
Calcium: 9.4 mg/dL (ref 8.4–10.5)
Potassium: 3.8 mEq/L (ref 3.5–5.1)
Sodium: 142 mEq/L (ref 135–145)
Total Protein: 7.7 g/dL (ref 6.0–8.3)

## 2012-02-04 LAB — CBC WITH DIFFERENTIAL/PLATELET
Basophils Absolute: 0.1 10*3/uL (ref 0.0–0.1)
Eosinophils Absolute: 0.4 10*3/uL (ref 0.0–0.7)
Eosinophils Relative: 6 % — ABNORMAL HIGH (ref 0–5)
Lymphocytes Relative: 45 % (ref 12–46)
MCV: 80.7 fL (ref 78.0–100.0)
Neutrophils Relative %: 39 % — ABNORMAL LOW (ref 43–77)
Platelets: 306 10*3/uL (ref 150–400)
RBC: 4.36 MIL/uL (ref 3.87–5.11)
RDW: 12.7 % (ref 11.5–15.5)
WBC: 7 10*3/uL (ref 4.0–10.5)

## 2012-02-04 MED ORDER — SODIUM CHLORIDE 0.9 % IV SOLN
Freq: Once | INTRAVENOUS | Status: AC
  Administered 2012-02-04: via INTRAVENOUS

## 2012-02-04 MED ORDER — PROMETHAZINE HCL 25 MG/ML IJ SOLN: 12.5000 mg | Freq: Once | INTRAMUSCULAR | Status: DC

## 2012-02-04 MED ORDER — PROMETHAZINE HCL 25 MG/ML IJ SOLN
25.0000 mg | Freq: Once | INTRAMUSCULAR | Status: AC
  Administered 2012-02-04: 25 mg via INTRAVENOUS
  Filled 2012-02-04: qty 1

## 2012-02-04 MED ORDER — HYDROMORPHONE HCL PF 1 MG/ML IJ SOLN
1.0000 mg | Freq: Once | INTRAMUSCULAR | Status: AC
  Administered 2012-02-04: 1 mg via INTRAVENOUS
  Filled 2012-02-04: qty 1

## 2012-02-04 MED ORDER — DIPHENHYDRAMINE HCL 50 MG/ML IJ SOLN
25.0000 mg | Freq: Once | INTRAMUSCULAR | Status: AC
  Administered 2012-02-04: via INTRAVENOUS
  Filled 2012-02-04: qty 1

## 2012-02-04 NOTE — ED Notes (Signed)
Nurse first advised that pt's visitors had come in through registration and peds after being told they were not to come back through due to lockdown status. Pt's visitors were asked to step out and get visitor's stickers as per dept policy. Pt and visitor became agitated and were yelling at staff and on phone. Press photographer and security notified.

## 2012-02-04 NOTE — ED Notes (Signed)
Rx x 0, pt voiced understanding to f/u with PCP and return for worsening of sx

## 2012-02-04 NOTE — ED Provider Notes (Signed)
History     CSN: [id number]  Arrival date & time 02/03/12  2208   First MD Initiated Contact with Patient 02/03/12 2326      Chief Complaint  Patient presents with  . Abdominal Pain    (Consider location/radiation/quality/duration/timing/severity/associated sxs/prior treatment) HPI Comments: [name] 31 y.o. female   The chief complaint is: Patient presents with:   Abdominal Pain  The patient has medical history significant for:   Past Medical History:   Pancreatitis                                                 S/P laparoscopic cholecystectomy                             Fibroids                                                     Ovarian cyst                                                 Chronic abdominal pain                                       Anemia                                                      The onset of the symptoms was gradual starting 2 weeks ago,  The Course is  persistent, gradually worsened Food, movement makes symptoms worse, nothing makes symptoms better Has associated nausea, vomiting, diarrhea, headache, itching and chills Denies denies fever, chest pain, SOB.        The history is provided by the patient, a friend and medical records. History Limited By: none.   [name] is a 31 y.o. female presents to the emergency department c/o N/V x 1.5 weeks.  She states the pain is sharp and cramping, rated at 10/10 with radiation to the back and it feels like her normal pancreatitis pain.  She has seen Dr Logan Bores who has been treating with PO pain meds and antiemetics.  She is unable to control her pain this way.  She is attempting to get an appointment with a GI doctor. Tonight she has nausea, vomiting, diarrhea, headache and chills.  She also c/o of severe itching. She denies fever, chest pain, SOB, hematemesis.    Past Medical History  Diagnosis Date  . Pancreatitis   . S/P laparoscopic cholecystectomy   . Fibroids   . Ovarian  cyst   . Chronic abdominal pain   . Anemia     Past Surgical History  Procedure Date  . Cholecystectomy   . Abdominal hysterectomy   . Dilation and curettage of uterus  Family History  Problem Relation Age of Onset  . Diabetes Mother   . Hypertension Mother   . Hypertension Father   . Diabetes Father   . Asthma Daughter     History  Substance Use Topics  . Smoking status: Current Everyday Smoker -- 0.5 packs/day for 10 years    Types: Cigarettes  . Smokeless tobacco: Never Used   Comment: declined  . Alcohol Use: No    OB History    Grav Para Term Preterm Abortions TAB SAB Ect Mult Living   4 2 1 1 2  2          Review of Systems  Constitutional: Positive for chills. Negative for fever, diaphoresis, appetite change, fatigue and unexpected weight change.  HENT: Negative for mouth sores, trouble swallowing, neck pain and neck stiffness.   Respiratory: Negative for cough, chest tightness, shortness of breath, wheezing and stridor.   Cardiovascular: Negative for chest pain and palpitations.  Gastrointestinal: Positive for nausea, vomiting and abdominal pain. Negative for diarrhea, constipation, blood in stool, abdominal distention and rectal pain.  Genitourinary: Negative for dysuria, urgency, frequency, hematuria, flank pain and difficulty urinating.  Musculoskeletal: Negative for back pain.  Skin: Negative for rash.       Itching  Neurological: Positive for headaches. Negative for weakness.  Hematological: Negative for adenopathy.  Psychiatric/Behavioral: Negative for confusion.  All other systems reviewed and are negative.    Allergies  Celecoxib; Ketorolac tromethamine; Meperidine hcl; Coconut flavor; Fentanyl; Fish allergy; Ibuprofen; Morphine; Propoxyphene-acetaminophen; and Tramadol hcl  Home Medications   Current Outpatient Rx  Name Route Sig Dispense Refill  . DIPHENHYDRAMINE HCL 50 MG PO CAPS Oral Take 50 mg by mouth every 6 (six) hours as needed.  For itching    . ESTRADIOL 2 MG PO TABS Oral Take 2 mg by mouth at bedtime.    . OXYCODONE HCL ER 10 MG PO TB12 Oral Take 10 mg by mouth 3 (three) times daily.    Marland Kitchen PROMETHAZINE HCL 25 MG PO TABS Oral Take 25 mg by mouth every 6 (six) hours as needed. For nausea      BP 118/76  Pulse 80  Temp 98.2 F (36.8 C) (Oral)  Resp 20  SpO2 95%  LMP 06/01/2011  Physical Exam  Nursing note and vitals reviewed. Constitutional: She appears well-developed and well-nourished.  HENT:  Head: Normocephalic and atraumatic.  Mouth/Throat: Oropharynx is clear and moist.  Eyes: Conjunctivae are normal. No scleral icterus.  Neck: Normal range of motion.  Cardiovascular: Normal rate, regular rhythm, normal heart sounds and intact distal pulses.  Exam reveals no gallop and no friction rub.   No murmur heard. Pulmonary/Chest: Effort normal and breath sounds normal. No respiratory distress. She has no wheezes.  Abdominal: Soft. Normal appearance and bowel sounds are normal. She exhibits no distension and no mass. There is tenderness in the epigastric area. There is no rigidity, no guarding, no CVA tenderness, no tenderness at McBurney's point and negative Murphy's sign.  Neurological: She is alert. Coordination normal.  Skin: Skin is warm and dry. No rash noted.  Psychiatric: She has a normal mood and affect.    ED Course  Procedures (including critical care time)  Labs Reviewed  CBC WITH DIFFERENTIAL - Abnormal; Notable for the following:    HCT 35.2 (*)     Neutrophils Relative 39 (*)     Eosinophils Relative 6 (*)     All other components within normal limits  COMPREHENSIVE METABOLIC PANEL  CBC WITH DIFFERENTIAL  LIPASE, BLOOD  COMPREHENSIVE METABOLIC PANEL  LIPASE, BLOOD   No results found.  Results for orders placed during the hospital encounter of 02/03/12  CBC WITH DIFFERENTIAL      Component Value Range   WBC 7.0  4.0 - 10.5 K/uL   RBC 4.36  3.87 - 5.11 MIL/uL   Hemoglobin 12.2   12.0 - 15.0 g/dL   HCT 16.1 (*) 09.6 - 04.5 %   MCV 80.7  78.0 - 100.0 fL   MCH 28.0  26.0 - 34.0 pg   MCHC 34.7  30.0 - 36.0 g/dL   RDW 40.9  81.1 - 91.4 %   Platelets 306  150 - 400 K/uL   Neutrophils Relative 39 (*) 43 - 77 %   Neutro Abs 2.8  1.7 - 7.7 K/uL   Lymphocytes Relative 45  12 - 46 %   Lymphs Abs 3.1  0.7 - 4.0 K/uL   Monocytes Relative 9  3 - 12 %   Monocytes Absolute 0.7  0.1 - 1.0 K/uL   Eosinophils Relative 6 (*) 0 - 5 %   Eosinophils Absolute 0.4  0.0 - 0.7 K/uL   Basophils Relative 1  0 - 1 %   Basophils Absolute 0.1  0.0 - 0.1 K/uL    1. Abdominal pain   2. Pancreatitis, chronic     MDM  [name] is a well known patient in the emergency room who presents with chronic abdominal pain, N/V.  She has a hx of pancreatitis.  She does not have peritoneal signs and raises no red flags for an acute abdomen.  Resulted labs are within normal.  She has had adequate pain control and antiemetics. I will discharge with instructions to f/u with her PCP.    1. Medications: usual home medications 2. Treatment: rest, hydration, take home medications as prescribed 3. Follow Up: with primary care physician             Dierdre Forth, PA-C 02/04/12 [phone]

## 2012-02-04 NOTE — ED Provider Notes (Signed)
Medical screening examination/treatment/procedure(s) were performed by non-physician practitioner and as supervising physician I was immediately available for consultation/collaboration.    Vida Roller, MD 02/04/12 [phone]

## 2012-02-04 NOTE — ED Notes (Signed)
Pt refused to let this RN remove IV. Charge nurse aware

## 2012-02-06 ENCOUNTER — Encounter (HOSPITAL_COMMUNITY): Payer: Self-pay

## 2012-02-06 ENCOUNTER — Emergency Department (HOSPITAL_COMMUNITY)
Admission: EM | Admit: 2012-02-06 | Discharge: 2012-02-07 | Disposition: A | Discharge status: Home / Self Care | Payer: Medicaid Other | Attending: Emergency Medicine | Admitting: Emergency Medicine

## 2012-02-06 DIAGNOSIS — R112 Nausea with vomiting, unspecified: Secondary | ICD-10-CM | POA: Insufficient documentation

## 2012-02-06 DIAGNOSIS — F17200 Nicotine dependence, unspecified, uncomplicated: Secondary | ICD-10-CM | POA: Insufficient documentation

## 2012-02-06 DIAGNOSIS — K861 Other chronic pancreatitis: Secondary | ICD-10-CM

## 2012-02-06 DIAGNOSIS — R1013 Epigastric pain: Secondary | ICD-10-CM | POA: Insufficient documentation

## 2012-02-06 LAB — URINALYSIS, ROUTINE W REFLEX MICROSCOPIC
Glucose, UA: NEGATIVE mg/dL
Hgb urine dipstick: NEGATIVE
Specific Gravity, Urine: 1.033 — ABNORMAL HIGH (ref 1.005–1.030)
pH: 6 (ref 5.0–8.0)

## 2012-02-06 LAB — CBC WITH DIFFERENTIAL/PLATELET
Basophils Absolute: 0 10*3/uL (ref 0.0–0.1)
Basophils Relative: 0 % (ref 0–1)
Eosinophils Absolute: 0.4 10*3/uL (ref 0.0–0.7)
Lymphs Abs: 2.9 10*3/uL (ref 0.7–4.0)
MCH: 27.4 pg (ref 26.0–34.0)
MCHC: 33.7 g/dL (ref 30.0–36.0)
Neutrophils Relative %: 53 % (ref 43–77)
Platelets: 292 10*3/uL (ref 150–400)
RBC: 4.71 MIL/uL (ref 3.87–5.11)

## 2012-02-06 LAB — COMPREHENSIVE METABOLIC PANEL
ALT: 42 U/L — ABNORMAL HIGH (ref 0–35)
AST: 26 U/L (ref 0–37)
Albumin: 4.1 g/dL (ref 3.5–5.2)
Alkaline Phosphatase: 89 U/L (ref 39–117)
Potassium: 3.8 mEq/L (ref 3.5–5.1)
Sodium: 140 mEq/L (ref 135–145)
Total Protein: 8.1 g/dL (ref 6.0–8.3)

## 2012-02-06 MED ORDER — DIPHENHYDRAMINE HCL 50 MG/ML IJ SOLN
50.0000 mg | Freq: Once | INTRAMUSCULAR | Status: AC
  Administered 2012-02-06: 50 mg via INTRAMUSCULAR
  Filled 2012-02-06: qty 1

## 2012-02-06 MED ORDER — PROMETHAZINE HCL 25 MG/ML IJ SOLN
25.0000 mg | Freq: Once | INTRAMUSCULAR | Status: AC
  Administered 2012-02-06: 25 mg via INTRAMUSCULAR
  Filled 2012-02-06: qty 1

## 2012-02-06 MED ORDER — HYDROMORPHONE HCL PF 1 MG/ML IJ SOLN
2.0000 mg | Freq: Once | INTRAMUSCULAR | Status: AC
  Administered 2012-02-06: 2 mg via INTRAMUSCULAR
  Filled 2012-02-06: qty 2

## 2012-02-06 NOTE — ED Notes (Signed)
Pt has hx of hysterectomy

## 2012-02-06 NOTE — ED Notes (Addendum)
Pt refused medications at this time. Pt aware of benadryl and phenergan IM. Pt states she wants a female MD, and is willing to wait on medications until she can move rooms. Pt did allow blood work to be collect.

## 2012-02-06 NOTE — ED Notes (Signed)
Pt reports abdominal pain on left side x 3 weeks. N/V/D x 2 weeks. Lower back pain. A.O. X 4 NAD.

## 2012-02-06 NOTE — ED Notes (Signed)
Pt complaining of abdominal pain. Pt states that she does not want to wait long for her blood work, pt not talking about abdominal pain just talking about wait. Pt aware or meds ordered.

## 2012-02-06 NOTE — ED Provider Notes (Signed)
History     CSN: [id number]  Arrival date & time 02/06/12  2028   First MD Initiated Contact with Patient 02/06/12 2154      Chief Complaint  Patient presents with  . Abdominal Pain    (Consider location/radiation/quality/duration/timing/severity/associated sxs/prior treatment) Patient is a 31 y.o. female presenting with abdominal pain. The history is provided by the patient.  Abdominal Pain The primary symptoms of the illness include abdominal pain.   Patient here with epigastric abdominal pain similar to her prior pancreatitis. She is well-known to this department on multiple visits for her chronic pancreatitis. States that he has vomited twice and has not had a fever. No change in her stools. Pain is made better with orthotics. Symptoms have been progressively worse x3 weeks. Has been seen in the ED multiple times for this. Past Medical History  Diagnosis Date  . Pancreatitis   . S/P laparoscopic cholecystectomy   . Fibroids   . Ovarian cyst   . Chronic abdominal pain   . Anemia     Past Surgical History  Procedure Date  . Cholecystectomy   . Abdominal hysterectomy   . Dilation and curettage of uterus     Family History  Problem Relation Age of Onset  . Diabetes Mother   . Hypertension Mother   . Hypertension Father   . Diabetes Father   . Asthma Daughter     History  Substance Use Topics  . Smoking status: Current Everyday Smoker -- 0.5 packs/day for 10 years    Types: Cigarettes  . Smokeless tobacco: Never Used   Comment: declined  . Alcohol Use: No    OB History    Grav Para Term Preterm Abortions TAB SAB Ect Mult Living   4 2 1 1 2  2          Review of Systems  Gastrointestinal: Positive for abdominal pain.  All other systems reviewed and are negative.    Allergies  Celecoxib; Ketorolac tromethamine; Meperidine hcl; Coconut flavor; Fentanyl; Fish allergy; Ibuprofen; Morphine; Propoxyphene-acetaminophen; and Tramadol hcl  Home Medications    Current Outpatient Rx  Name Route Sig Dispense Refill  . DIPHENHYDRAMINE HCL 50 MG PO CAPS Oral Take 50 mg by mouth every 6 (six) hours as needed. For itching    . ESTRADIOL 2 MG PO TABS Oral Take 2 mg by mouth at bedtime.    . OXYCODONE HCL ER 10 MG PO TB12 Oral Take 10 mg by mouth 3 (three) times daily.    Marland Kitchen PROMETHAZINE HCL 25 MG PO TABS Oral Take 25 mg by mouth every 6 (six) hours as needed. For nausea      BP 119/71  Pulse 96  Temp 98 F (36.7 C) (Oral)  Resp 14  SpO2 97%  LMP 02/04/2012  Physical Exam  Nursing note and vitals reviewed. Constitutional: She is oriented to person, place, and time. She appears well-developed and well-nourished.  Non-toxic appearance. No distress.  HENT:  Head: Normocephalic and atraumatic.  Eyes: Conjunctivae, EOM and lids are normal. Pupils are equal, round, and reactive to light.  Neck: Normal range of motion. Neck supple. No tracheal deviation present. No mass present.  Cardiovascular: Normal rate, regular rhythm and normal heart sounds.  Exam reveals no gallop.   No murmur heard. Pulmonary/Chest: Effort normal and breath sounds normal. No stridor. No respiratory distress. She has no decreased breath sounds. She has no wheezes. She has no rhonchi. She has no rales.  Abdominal: Soft. Normal appearance and  bowel sounds are normal. She exhibits no distension. There is tenderness in the epigastric area. There is no rigidity, no rebound, no guarding and no CVA tenderness.  Musculoskeletal: Normal range of motion. She exhibits no edema and no tenderness.  Neurological: She is alert and oriented to person, place, and time. She has normal strength. No cranial nerve deficit or sensory deficit. GCS eye subscore is 4. GCS verbal subscore is 5. GCS motor subscore is 6.  Skin: Skin is warm and dry. No abrasion and no rash noted.  Psychiatric: She has a normal mood and affect. Her speech is normal and behavior is normal.    ED Course  Procedures  (including critical care time)   Labs Reviewed  URINALYSIS, ROUTINE W REFLEX MICROSCOPIC  CBC WITH DIFFERENTIAL  COMPREHENSIVE METABOLIC PANEL  LIPASE, BLOOD   No results found.   No diagnosis found.    MDM  Pt given dilaudid and will f/u her pcp        Toy Baker, MD 02/06/12 2344

## 2012-02-06 NOTE — ED Notes (Signed)
Pt offered benadryl and phenergan again. Pt still refused until blood work comes back.

## 2012-02-09 ENCOUNTER — Emergency Department (HOSPITAL_COMMUNITY)
Admission: EM | Admit: 2012-02-09 | Discharge: 2012-02-10 | Disposition: A | Discharge status: Home / Self Care | Payer: Medicaid Other | Attending: Emergency Medicine | Admitting: Emergency Medicine

## 2012-02-09 ENCOUNTER — Encounter (HOSPITAL_COMMUNITY): Payer: Self-pay | Admitting: Adult Health

## 2012-02-09 DIAGNOSIS — R109 Unspecified abdominal pain: Secondary | ICD-10-CM | POA: Insufficient documentation

## 2012-02-09 DIAGNOSIS — F17200 Nicotine dependence, unspecified, uncomplicated: Secondary | ICD-10-CM | POA: Insufficient documentation

## 2012-02-09 DIAGNOSIS — K861 Other chronic pancreatitis: Secondary | ICD-10-CM | POA: Insufficient documentation

## 2012-02-09 DIAGNOSIS — G8929 Other chronic pain: Secondary | ICD-10-CM | POA: Insufficient documentation

## 2012-02-09 DIAGNOSIS — D649 Anemia, unspecified: Secondary | ICD-10-CM | POA: Insufficient documentation

## 2012-02-09 LAB — CBC WITH DIFFERENTIAL/PLATELET
Basophils Relative: 1 % (ref 0–1)
Eosinophils Absolute: 0.3 10*3/uL (ref 0.0–0.7)
HCT: 35.7 % — ABNORMAL LOW (ref 36.0–46.0)
Hemoglobin: 12.1 g/dL (ref 12.0–15.0)
Lymphs Abs: 2.6 10*3/uL (ref 0.7–4.0)
MCH: 27.2 pg (ref 26.0–34.0)
MCHC: 33.9 g/dL (ref 30.0–36.0)
Monocytes Absolute: 0.4 10*3/uL (ref 0.1–1.0)
Monocytes Relative: 6 % (ref 3–12)

## 2012-02-09 LAB — COMPREHENSIVE METABOLIC PANEL
Albumin: 3.7 g/dL (ref 3.5–5.2)
BUN: 10 mg/dL (ref 6–23)
Chloride: 107 mEq/L (ref 96–112)
Creatinine, Ser: 0.62 mg/dL (ref 0.50–1.10)
GFR calc Af Amer: >90 mL/min (ref >90)
Glucose, Bld: 104 mg/dL — ABNORMAL HIGH (ref 70–99)
Total Bilirubin: 0.3 mg/dL (ref 0.3–1.2)

## 2012-02-09 LAB — URINALYSIS, ROUTINE W REFLEX MICROSCOPIC
Bilirubin Urine: NEGATIVE
Ketones, ur: NEGATIVE mg/dL
Nitrite: NEGATIVE
Protein, ur: NEGATIVE mg/dL
Urobilinogen, UA: 0.2 mg/dL (ref 0.0–1.0)
pH: 6.5 (ref 5.0–8.0)

## 2012-02-09 LAB — LIPASE, BLOOD: Lipase: 64 U/L — ABNORMAL HIGH (ref 11–59)

## 2012-02-09 LAB — URINE MICROSCOPIC-ADD ON

## 2012-02-09 MED ORDER — METOCLOPRAMIDE HCL 5 MG/ML IJ SOLN
10.0000 mg | Freq: Once | INTRAMUSCULAR | Status: AC
  Administered 2012-02-09: 10 mg via INTRAVENOUS
  Filled 2012-02-09: qty 2

## 2012-02-09 MED ORDER — HYDROMORPHONE HCL PF 1 MG/ML IJ SOLN
1.0000 mg | Freq: Once | INTRAMUSCULAR | Status: AC
  Administered 2012-02-09: 1 mg via INTRAVENOUS
  Filled 2012-02-09: qty 1

## 2012-02-09 MED ORDER — ONDANSETRON HCL 4 MG/2ML IJ SOLN
4.0000 mg | Freq: Once | INTRAMUSCULAR | Status: AC
  Administered 2012-02-09: 4 mg via INTRAVENOUS
  Filled 2012-02-09: qty 2

## 2012-02-09 MED ORDER — DIPHENHYDRAMINE HCL 50 MG/ML IJ SOLN
12.5000 mg | Freq: Once | INTRAMUSCULAR | Status: AC
  Administered 2012-02-09: 12.5 mg via INTRAVENOUS
  Filled 2012-02-09: qty 1

## 2012-02-09 NOTE — ED Notes (Signed)
Pt. Reports pain in upper left abdomen. Pain 10/10. States "I think my enzymes are high". Pt also reports headache. Hx of same. Bowel sounds active, tender to palpitation.

## 2012-02-09 NOTE — ED Provider Notes (Signed)
History     CSN: [id number]  Arrival date & time 02/09/12  1910   First MD Initiated Contact with Patient 02/09/12 2056      Chief Complaint  Patient presents with  . Pancreatitis    (Consider location/radiation/quality/duration/timing/severity/associated sxs/prior treatment) HPI Comments: [name] is a 31 y.o. Female with hx of chronic pancreatitis, coming in today complaining of ongoing abdominal pain, nausea, vomiting. States symptoms never really went away, but worsened yesterday. States unable to keep anything down. Admits to diarrhea. No problems with urination. No URI symptoms. No fever. Taking her medications at home with no relief. Pt with multiple prior evaluations here for the same.    Past Medical History  Diagnosis Date  . Pancreatitis   . S/P laparoscopic cholecystectomy   . Fibroids   . Ovarian cyst   . Chronic abdominal pain   . Anemia     Past Surgical History  Procedure Date  . Cholecystectomy   . Abdominal hysterectomy   . Dilation and curettage of uterus     Family History  Problem Relation Age of Onset  . Diabetes Mother   . Hypertension Mother   . Hypertension Father   . Diabetes Father   . Asthma Daughter     History  Substance Use Topics  . Smoking status: Current Everyday Smoker -- 0.5 packs/day for 10 years    Types: Cigarettes  . Smokeless tobacco: Never Used   Comment: declined  . Alcohol Use: No    OB History    Grav Para Term Preterm Abortions TAB SAB Ect Mult Living   4 2 1 1 2  2          Review of Systems  Constitutional: Negative for fever and chills.  HENT: Negative for neck pain and neck stiffness.   Respiratory: Negative.   Cardiovascular: Negative.   Gastrointestinal: Positive for nausea, vomiting, abdominal pain and diarrhea.  Genitourinary: Negative for dysuria, hematuria, flank pain, vaginal bleeding and vaginal pain.  Musculoskeletal: Negative for back pain.  Skin: Negative.   Neurological: Negative.       Allergies  Celecoxib; Ketorolac tromethamine; Meperidine hcl; Coconut flavor; Fentanyl; Fish allergy; Ibuprofen; Morphine; Propoxyphene-acetaminophen; and Tramadol hcl  Home Medications   Current Outpatient Rx  Name Route Sig Dispense Refill  . DIPHENHYDRAMINE HCL 50 MG PO CAPS Oral Take 50 mg by mouth every 6 (six) hours as needed. For itching    . ESTRADIOL 2 MG PO TABS Oral Take 2 mg by mouth at bedtime.    . OXYCODONE HCL ER 10 MG PO TB12 Oral Take 10 mg by mouth 3 (three) times daily.    Marland Kitchen PROMETHAZINE HCL 25 MG PO TABS Oral Take 25 mg by mouth every 6 (six) hours as needed. For nausea      BP 117/72  Pulse 90  Temp 97.8 F (36.6 C) (Oral)  Resp 16  SpO2 98%  LMP 02/04/2012  Physical Exam  Nursing note and vitals reviewed. Constitutional: She is oriented to person, place, and time. She appears well-developed and well-nourished. No distress.  HENT:  Head: Normocephalic.  Eyes: Conjunctivae are normal.  Neck: Neck supple.  Cardiovascular: Normal rate, regular rhythm and normal heart sounds.   Pulmonary/Chest: Effort normal and breath sounds normal. No respiratory distress. She has no wheezes. She has no rales.  Abdominal: Soft. Bowel sounds are normal. She exhibits no distension. There is tenderness. There is no rebound.       LUQ and RUQ tenderness.  No guarding  Musculoskeletal: Normal range of motion. She exhibits no edema.  Neurological: She is alert and oriented to person, place, and time.  Skin: Skin is warm and dry.  Psychiatric: She has a normal mood and affect.    ED Course  Procedures (including critical care time)  Labs Reviewed  CBC WITH DIFFERENTIAL - Abnormal; Notable for the following:    HCT 35.7 (*)     All other components within normal limits  COMPREHENSIVE METABOLIC PANEL - Abnormal; Notable for the following:    Glucose, Bld 104 (*)     All other components within normal limits  LIPASE, BLOOD - Abnormal; Notable for the following:     Lipase 64 (*)     All other components within normal limits  URINALYSIS, ROUTINE W REFLEX MICROSCOPIC - Abnormal; Notable for the following:    APPearance CLOUDY (*)     Leukocytes, UA TRACE (*)     All other components within normal limits  URINE MICROSCOPIC-ADD ON - Abnormal; Notable for the following:    Squamous Epithelial / LPF MANY (*)     All other components within normal limits  PREGNANCY, URINE   Pt with chronic abdominal pain, hx of pancreatitis. Labs unremarkable today. Will give fluids, pain medications. Monitor.   1:01 AM Pt feeling better, will try PO challenge.  Plan to D/C home with follow up.   Tolerating POs. Feeling improved. No diestress.  1. Abdominal pain       MDM          Lottie Mussel, PA 02/10/12 2236

## 2012-02-09 NOTE — ED Notes (Signed)
C/o upper abdomen pain that is consistent with pancreatic pain associated with nausea.

## 2012-02-10 MED ORDER — DIPHENHYDRAMINE HCL 50 MG/ML IJ SOLN
12.5000 mg | Freq: Once | INTRAMUSCULAR | Status: AC
  Administered 2012-02-10: 12.5 mg via INTRAVENOUS

## 2012-02-10 MED ORDER — HYDROMORPHONE HCL PF 1 MG/ML IJ SOLN
1.0000 mg | Freq: Once | INTRAMUSCULAR | Status: AC
  Administered 2012-02-10: 1 mg via INTRAVENOUS
  Filled 2012-02-10: qty 1

## 2012-02-11 NOTE — ED Provider Notes (Signed)
Medical screening examination/treatment/procedure(s) were performed by non-physician practitioner and as supervising physician I was immediately available for consultation/collaboration. Keiara Sneeringer, MD, FACEP   Murlin Schrieber L Hansel Devan, MD 02/11/12 1620 

## 2012-02-14 ENCOUNTER — Encounter (HOSPITAL_COMMUNITY): Payer: Self-pay | Admitting: Emergency Medicine

## 2012-02-14 ENCOUNTER — Emergency Department (HOSPITAL_COMMUNITY)
Admission: EM | Admit: 2012-02-14 | Discharge: 2012-02-15 | Disposition: A | Discharge status: Home / Self Care | Payer: Medicaid Other | Attending: Emergency Medicine | Admitting: Emergency Medicine

## 2012-02-14 DIAGNOSIS — K861 Other chronic pancreatitis: Secondary | ICD-10-CM

## 2012-02-14 DIAGNOSIS — G8929 Other chronic pain: Secondary | ICD-10-CM | POA: Insufficient documentation

## 2012-02-14 DIAGNOSIS — D649 Anemia, unspecified: Secondary | ICD-10-CM | POA: Insufficient documentation

## 2012-02-14 DIAGNOSIS — F17200 Nicotine dependence, unspecified, uncomplicated: Secondary | ICD-10-CM | POA: Insufficient documentation

## 2012-02-14 DIAGNOSIS — R109 Unspecified abdominal pain: Secondary | ICD-10-CM | POA: Insufficient documentation

## 2012-02-14 LAB — CBC WITH DIFFERENTIAL/PLATELET
Basophils Relative: 1 % (ref 0–1)
Eosinophils Absolute: 0.4 10*3/uL (ref 0.0–0.7)
Eosinophils Relative: 6 % — ABNORMAL HIGH (ref 0–5)
HCT: 36.1 % (ref 36.0–46.0)
Hemoglobin: 12.3 g/dL (ref 12.0–15.0)
Lymphs Abs: 2.5 10*3/uL (ref 0.7–4.0)
MCH: 27.3 pg (ref 26.0–34.0)
MCHC: 34.1 g/dL (ref 30.0–36.0)
MCV: 80.2 fL (ref 78.0–100.0)
Monocytes Absolute: 0.5 10*3/uL (ref 0.1–1.0)
Monocytes Relative: 8 % (ref 3–12)
RBC: 4.5 MIL/uL (ref 3.87–5.11)

## 2012-02-14 LAB — BASIC METABOLIC PANEL
BUN: 9 mg/dL (ref 6–23)
Creatinine, Ser: 0.61 mg/dL (ref 0.50–1.10)
GFR calc Af Amer: >90 mL/min (ref >90)
GFR calc non Af Amer: >90 mL/min (ref >90)
Glucose, Bld: 88 mg/dL (ref 70–99)
Potassium: 3.9 mEq/L (ref 3.5–5.1)

## 2012-02-14 MED ORDER — DIPHENHYDRAMINE HCL 50 MG/ML IJ SOLN
25.0000 mg | Freq: Once | INTRAMUSCULAR | Status: AC
  Administered 2012-02-14: 25 mg via INTRAVENOUS

## 2012-02-14 MED ORDER — OXYCODONE-ACETAMINOPHEN 5-325 MG PO TABS: 1.0000 | ORAL_TABLET | Freq: Four times a day (QID) | ORAL | Status: DC | PRN

## 2012-02-14 MED ORDER — SODIUM CHLORIDE 0.9 % IV SOLN
Freq: Once | INTRAVENOUS | Status: AC
  Administered 2012-02-14: 22:00:00 via INTRAVENOUS

## 2012-02-14 MED ORDER — HYDROMORPHONE HCL PF 1 MG/ML IJ SOLN
1.0000 mg | Freq: Once | INTRAMUSCULAR | Status: AC
  Administered 2012-02-14: 1 mg via INTRAVENOUS
  Filled 2012-02-14: qty 1

## 2012-02-14 MED ORDER — PROMETHAZINE HCL 25 MG/ML IJ SOLN
12.5000 mg | Freq: Once | INTRAMUSCULAR | Status: AC
  Administered 2012-02-14: 12.5 mg via INTRAVENOUS
  Filled 2012-02-14: qty 1

## 2012-02-14 MED ORDER — HYDROMORPHONE HCL PF 1 MG/ML IJ SOLN
INTRAMUSCULAR | Status: AC
  Administered 2012-02-14: 1 mg via INTRAVENOUS
  Filled 2012-02-14: qty 1

## 2012-02-14 MED ORDER — HYDROMORPHONE HCL PF 1 MG/ML IJ SOLN
1.0000 mg | Freq: Once | INTRAMUSCULAR | Status: AC
  Administered 2012-02-14: 1 mg via INTRAVENOUS

## 2012-02-14 MED ORDER — DIPHENHYDRAMINE HCL 50 MG/ML IJ SOLN
25.0000 mg | Freq: Once | INTRAMUSCULAR | Status: AC
  Administered 2012-02-14: 25 mg via INTRAVENOUS
  Filled 2012-02-14: qty 1

## 2012-02-14 MED ORDER — ONDANSETRON HCL 4 MG/2ML IJ SOLN
4.0000 mg | Freq: Once | INTRAMUSCULAR | Status: DC
  Filled 2012-02-14: qty 2

## 2012-02-14 MED ORDER — DIPHENHYDRAMINE HCL 50 MG/ML IJ SOLN
INTRAMUSCULAR | Status: AC
  Administered 2012-02-14: 25 mg via INTRAVENOUS
  Filled 2012-02-14: qty 1

## 2012-02-14 NOTE — ED Notes (Signed)
RN spoke with IV team.

## 2012-02-14 NOTE — ED Provider Notes (Signed)
History     CSN: [id number]  Arrival date & time 02/14/12  1554   First MD Initiated Contact with Patient 02/14/12 2043      Chief Complaint  Patient presents with  . Abdominal Pain  . Nausea  . Emesis    (Consider location/radiation/quality/duration/timing/severity/associated sxs/prior treatment) HPI Comments: Patient with history of chronic pancreatitis.  Presents here every few days with worsening pain, requiring dilaudid and benadryl.  She returns again for this today.    Patient is a 31 y.o. female presenting with abdominal pain. The history is provided by the patient.  Abdominal Pain The primary symptoms of the illness include nausea. The primary symptoms of the illness do not include dysuria. The problem has been gradually worsening.  The patient states that she believes she is currently not pregnant. The patient has not had a change in bowel habit. Symptoms associated with the illness do not include urgency or frequency.    Past Medical History  Diagnosis Date  . Pancreatitis   . S/P laparoscopic cholecystectomy   . Fibroids   . Ovarian cyst   . Chronic abdominal pain   . Anemia     Past Surgical History  Procedure Date  . Cholecystectomy   . Abdominal hysterectomy   . Dilation and curettage of uterus     Family History  Problem Relation Age of Onset  . Diabetes Mother   . Hypertension Mother   . Hypertension Father   . Diabetes Father   . Asthma Daughter     History  Substance Use Topics  . Smoking status: Current Everyday Smoker -- 0.5 packs/day for 10 years    Types: Cigarettes  . Smokeless tobacco: Never Used   Comment: declined  . Alcohol Use: No    OB History    Grav Para Term Preterm Abortions TAB SAB Ect Mult Living   4 2 1 1 2  2          Review of Systems  Gastrointestinal: Positive for nausea.  Genitourinary: Negative for dysuria, urgency and frequency.  All other systems reviewed and are negative.    Allergies  Celecoxib;  Ketorolac tromethamine; Meperidine hcl; Coconut flavor; Fentanyl; Fish allergy; Ibuprofen; Morphine; Propoxyphene-acetaminophen; and Tramadol hcl  Home Medications   Current Outpatient Rx  Name Route Sig Dispense Refill  . DIPHENHYDRAMINE HCL 50 MG PO CAPS Oral Take 50 mg by mouth every 6 (six) hours as needed. For itching    . ESTRADIOL 2 MG PO TABS Oral Take 2 mg by mouth at bedtime.    . OXYCODONE HCL ER 10 MG PO TB12 Oral Take 10 mg by mouth 3 (three) times daily.    Marland Kitchen PROMETHAZINE HCL 25 MG PO TABS Oral Take 25 mg by mouth every 6 (six) hours as needed. For nausea      BP 127/83  Pulse 78  Temp 98.4 F (36.9 C) (Oral)  Resp 18  SpO2 100%  LMP 02/04/2012  Physical Exam  Nursing note and vitals reviewed. Constitutional: She is oriented to person, place, and time. She appears well-developed and well-nourished. No distress.  HENT:  Head: Normocephalic and atraumatic.  Mouth/Throat: Oropharynx is clear and moist.  Neck: Normal range of motion. Neck supple.  Cardiovascular: Normal rate and regular rhythm.   No murmur heard. Pulmonary/Chest: Effort normal and breath sounds normal. No respiratory distress. She has no wheezes.  Abdominal: Soft. Bowel sounds are normal. She exhibits no distension.       There is mild  ttp in the epigastrium.  No rebound or guarding.  Bowel sounds are present and normoactive.  Musculoskeletal: Normal range of motion. She exhibits no edema.  Neurological: She is alert and oriented to person, place, and time.  Skin: Skin is warm and dry. She is not diaphoretic.    ED Course  Procedures (including critical care time)   Labs Reviewed  URINALYSIS, ROUTINE W REFLEX MICROSCOPIC  CBC WITH DIFFERENTIAL  BASIC METABOLIC PANEL  LIPASE, BLOOD  AMYLASE   No results found.   No diagnosis found.    MDM  Pain treated, hydrated.  Will be discharged to home.  Needs GI follow up.  I have told her this in the past, however she never seems to arrange  this.  She returns here for more dilaudid and benadryl.  No reason to believe she will follow up with her pcp/gi this time either.        Geoffery Lyons, MD 02/15/12 [phone]

## 2012-02-14 NOTE — ED Notes (Signed)
IV team will come and see pt

## 2012-02-14 NOTE — ED Notes (Signed)
IV attempted by 2 RNs. Unable to obtain IV access.

## 2012-02-14 NOTE — ED Notes (Signed)
IV team paged.  

## 2012-02-14 NOTE — ED Notes (Signed)
Pt. Complain of abdominal pain and headache over left eye. Hx of same. States she was sent here by doctor.

## 2012-02-14 NOTE — ED Notes (Signed)
Pt c/o upper abd pain and lower back pain; pt sts saw her doctor for blood work yesterday and sts elevated pancreatic enzymes; pt sts N/V/D

## 2012-02-14 NOTE — ED Notes (Signed)
IV placement attempted X3.  Pt tolerated well.  Warm blanket provided. Pt moved to main waiting area.  IV access and blood work pending.

## 2012-02-14 NOTE — ED Notes (Signed)
Prescription x1 given with discharge instructions.

## 2012-02-17 ENCOUNTER — Encounter (HOSPITAL_COMMUNITY): Payer: Self-pay | Admitting: Emergency Medicine

## 2012-02-17 ENCOUNTER — Emergency Department (HOSPITAL_COMMUNITY)
Admission: EM | Admit: 2012-02-17 | Discharge: 2012-02-17 | Disposition: A | Discharge status: Home / Self Care | Payer: Medicaid Other | Attending: Emergency Medicine | Admitting: Emergency Medicine

## 2012-02-17 DIAGNOSIS — R109 Unspecified abdominal pain: Secondary | ICD-10-CM | POA: Insufficient documentation

## 2012-02-17 DIAGNOSIS — D649 Anemia, unspecified: Secondary | ICD-10-CM | POA: Insufficient documentation

## 2012-02-17 DIAGNOSIS — G8929 Other chronic pain: Secondary | ICD-10-CM | POA: Insufficient documentation

## 2012-02-17 DIAGNOSIS — K859 Acute pancreatitis without necrosis or infection, unspecified: Secondary | ICD-10-CM | POA: Insufficient documentation

## 2012-02-17 DIAGNOSIS — F17200 Nicotine dependence, unspecified, uncomplicated: Secondary | ICD-10-CM | POA: Insufficient documentation

## 2012-02-17 LAB — CBC WITH DIFFERENTIAL/PLATELET
Basophils Absolute: 0.1 10*3/uL (ref 0.0–0.1)
Basophils Relative: 1 % (ref 0–1)
Eosinophils Absolute: 0.5 10*3/uL (ref 0.0–0.7)
Eosinophils Relative: 7 % — ABNORMAL HIGH (ref 0–5)
MCH: 28 pg (ref 26.0–34.0)
MCHC: 34.9 g/dL (ref 30.0–36.0)
MCV: 80.3 fL (ref 78.0–100.0)
Platelets: 259 10*3/uL (ref 150–400)
RDW: 12.5 % (ref 11.5–15.5)
WBC: 7.6 10*3/uL (ref 4.0–10.5)

## 2012-02-17 LAB — URINALYSIS, ROUTINE W REFLEX MICROSCOPIC
Bilirubin Urine: NEGATIVE
Ketones, ur: NEGATIVE mg/dL
Leukocytes, UA: NEGATIVE
Nitrite: NEGATIVE
Protein, ur: NEGATIVE mg/dL

## 2012-02-17 LAB — URINE MICROSCOPIC-ADD ON

## 2012-02-17 LAB — BASIC METABOLIC PANEL
Calcium: 9.5 mg/dL (ref 8.4–10.5)
GFR calc non Af Amer: >90 mL/min (ref >90)
Sodium: 139 mEq/L (ref 135–145)

## 2012-02-17 LAB — LIPASE, BLOOD: Lipase: 169 U/L — ABNORMAL HIGH (ref 11–59)

## 2012-02-17 MED ORDER — DIPHENHYDRAMINE HCL 50 MG/ML IJ SOLN
25.0000 mg | Freq: Once | INTRAMUSCULAR | Status: AC
  Administered 2012-02-17: 25 mg via INTRAMUSCULAR
  Filled 2012-02-17: qty 1

## 2012-02-17 MED ORDER — PROMETHAZINE HCL 25 MG/ML IJ SOLN
25.0000 mg | Freq: Once | INTRAMUSCULAR | Status: AC
  Administered 2012-02-17: 25 mg via INTRAMUSCULAR
  Filled 2012-02-17: qty 1

## 2012-02-17 MED ORDER — HYDROMORPHONE HCL PF 2 MG/ML IJ SOLN
2.0000 mg | Freq: Once | INTRAMUSCULAR | Status: AC
  Administered 2012-02-17: 2 mg via INTRAMUSCULAR
  Filled 2012-02-17: qty 1

## 2012-02-17 NOTE — ED Provider Notes (Signed)
History     CSN: [id number]  Arrival date & time 02/17/12  0041   First MD Initiated Contact with Patient 02/17/12 [phone]      Chief Complaint  Patient presents with  . Abdominal Pain    (Consider location/radiation/quality/duration/timing/severity/associated sxs/prior treatment) HPI Comments: Patient with history of chronic abdominal pain and pancreatitis presenting with typical symptoms. She complains of epigastric pain with nausea, vomiting and diarrhea for the past week. She's had multiple ED visits for the same. She denies any fevers, chest pain or shortness of breath. She denies any blood in her emesis or stool. She states she is unable to keep any medications down. She is not follow up with her GI specialist.  The history is provided by the patient.    Past Medical History  Diagnosis Date  . Pancreatitis   . S/P laparoscopic cholecystectomy   . Fibroids   . Ovarian cyst   . Chronic abdominal pain   . Anemia     Past Surgical History  Procedure Date  . Cholecystectomy   . Abdominal hysterectomy   . Dilation and curettage of uterus     Family History  Problem Relation Age of Onset  . Diabetes Mother   . Hypertension Mother   . Hypertension Father   . Diabetes Father   . Asthma Daughter     History  Substance Use Topics  . Smoking status: Current Everyday Smoker -- 0.5 packs/day for 10 years    Types: Cigarettes  . Smokeless tobacco: Never Used   Comment: declined  . Alcohol Use: No    OB History    Grav Para Term Preterm Abortions TAB SAB Ect Mult Living   4 2 1 1 2  2          Review of Systems  Constitutional: Negative for fever, activity change and appetite change.  HENT: Negative for congestion and rhinorrhea.   Respiratory: Negative for cough, chest tightness and shortness of breath.   Cardiovascular: Negative for chest pain.  Gastrointestinal: Positive for nausea, vomiting, abdominal pain and diarrhea.  Genitourinary: Negative for dysuria,  hematuria, vaginal bleeding and vaginal discharge.  Musculoskeletal: Negative for back pain.  Skin: Negative for rash.  Neurological: Negative for dizziness and headaches.    Allergies  Celecoxib; Ketorolac tromethamine; Meperidine hcl; Coconut flavor; Zofran; Fentanyl; Fish allergy; Ibuprofen; Morphine; Propoxyphene-acetaminophen; and Tramadol hcl  Home Medications   Current Outpatient Rx  Name Route Sig Dispense Refill  . DIPHENHYDRAMINE HCL 50 MG PO CAPS Oral Take 50 mg by mouth every 4 (four) hours as needed. For itching    . ESTRADIOL 2 MG PO TABS Oral Take 2 mg by mouth at bedtime.    Marland Kitchen METOCLOPRAMIDE HCL 10 MG PO TABS Oral Take 10 mg by mouth 4 (four) times daily as needed. nausea    . OXYCODONE HCL ER 10 MG PO TB12 Oral Take 10 mg by mouth 3 (three) times daily.    . OXYCODONE-ACETAMINOPHEN 5-325 MG PO TABS Oral Take 1-2 tablets by mouth every 6 (six) hours as needed for pain. 10 tablet 0    BP 120/75  Pulse 100  Resp 18  SpO2 98%  LMP 02/04/2012  Physical Exam  Constitutional: She is oriented to person, place, and time. She appears well-developed. No distress.       Well-hydrated, no distress  HENT:  Head: Normocephalic and atraumatic.  Mouth/Throat: Oropharynx is clear and moist.  Eyes: Conjunctivae are normal. Pupils are equal, round, and reactive to  light.  Neck: Normal range of motion. Neck supple.  Cardiovascular: Normal rate, regular rhythm and normal heart sounds.   No murmur heard. Pulmonary/Chest: Effort normal and breath sounds normal. No respiratory distress.  Abdominal: Soft. There is tenderness. There is no rebound and no guarding.  Musculoskeletal: Normal range of motion. She exhibits no edema and no tenderness.  Neurological: She is alert and oriented to person, place, and time. No cranial nerve deficit.  Skin: Skin is warm.    ED Course  Procedures (including critical care time)  Labs Reviewed  CBC WITH DIFFERENTIAL - Abnormal; Notable for the  following:    Neutrophils Relative 41 (*)     Eosinophils Relative 7 (*)     All other components within normal limits  LIPASE, BLOOD - Abnormal; Notable for the following:    Lipase 169 (*)     All other components within normal limits  URINALYSIS, ROUTINE W REFLEX MICROSCOPIC - Abnormal; Notable for the following:    APPearance HAZY (*)     Hgb urine dipstick TRACE (*)     All other components within normal limits  URINE MICROSCOPIC-ADD ON - Abnormal; Notable for the following:    Squamous Epithelial / LPF FEW (*)     Bacteria, UA FEW (*)     All other components within normal limits  BASIC METABOLIC PANEL  POCT PREGNANCY, URINE   No results found.   1. Chronic abdominal pain       MDM  Acute and chronic abdominal pain. Vital signs stable, abdomen soft and nonsurgical.  Labs at baseline.  Tolerating PO. No vomiting in the ED.  Needs to follow up with PCP and GI.       Glynn Octave, MD 02/17/12 [phone]

## 2012-02-17 NOTE — ED Notes (Signed)
PT. REPORTS PERSISTENT GENERALIZED ABDOMINAL PAIN WITH VOMITTING AND DIARRHEA FOR SEVERAL WEEKS.  

## 2012-02-17 NOTE — ED Notes (Addendum)
Patient here for persistent nausea, vomiting and diarrhea for last few weeks.  Patient has continuing pain with elevated lipase for last few weeks.  Patient here for pain control and nausea control.

## 2012-02-17 NOTE — ED Notes (Addendum)
Pt wanted to speak with EDP/ NOtified EDP. PT provided urine specimen Encourage PO fluid pt stated " I am very nauseous"

## 2012-02-17 NOTE — ED Notes (Signed)
Disregard Fluid Challenge Completed@ 6:37a.m. Accidentally clicked/Pt refused the fluid challenge

## 2012-02-19 ENCOUNTER — Encounter (HOSPITAL_COMMUNITY): Payer: Self-pay | Admitting: Emergency Medicine

## 2012-02-19 ENCOUNTER — Emergency Department (HOSPITAL_COMMUNITY)
Admission: EM | Admit: 2012-02-19 | Discharge: 2012-02-19 | Disposition: A | Discharge status: Home / Self Care | Payer: Medicaid Other | Attending: Emergency Medicine | Admitting: Emergency Medicine

## 2012-02-19 DIAGNOSIS — F17200 Nicotine dependence, unspecified, uncomplicated: Secondary | ICD-10-CM | POA: Insufficient documentation

## 2012-02-19 DIAGNOSIS — R110 Nausea: Secondary | ICD-10-CM | POA: Insufficient documentation

## 2012-02-19 DIAGNOSIS — R109 Unspecified abdominal pain: Secondary | ICD-10-CM

## 2012-02-19 DIAGNOSIS — G8929 Other chronic pain: Secondary | ICD-10-CM | POA: Insufficient documentation

## 2012-02-19 LAB — LIPASE, BLOOD: Lipase: 51 U/L (ref 11–59)

## 2012-02-19 MED ORDER — METOCLOPRAMIDE HCL 5 MG/ML IJ SOLN: 10.0000 mg | Freq: Once | INTRAMUSCULAR | Status: DC

## 2012-02-19 MED ORDER — PROMETHAZINE HCL 25 MG RE SUPP
25.0000 mg | Freq: Four times a day (QID) | RECTAL | Status: DC | PRN
Start: 2012-02-19 — End: 2012-02-19
  Filled 2012-02-19: qty 1

## 2012-02-19 NOTE — ED Notes (Signed)
Reports upper abd pain x 3 weeks with nausea, vomiting, and diarrhea.  Also reports chest pain since last night.

## 2012-02-19 NOTE — ED Provider Notes (Signed)
History     CSN: [id number]  Arrival date & time 02/19/12  0158   First MD Initiated Contact with Patient 02/19/12 0217      Chief Complaint  Patient presents with  . Abdominal Pain    (Consider location/radiation/quality/duration/timing/severity/associated sxs/prior treatment) HPI 31 year old female presents to emergency room complaining of persistent abdominal pain, nausea and vomiting. Patient is frequently in the emergency department for similar symptoms. Her most recent visit was 2 days ago. Patient is followed by local primary care doctor. Patient reports she has had ongoing symptoms for the last 3 weeks. She reports that she is only vomiting bile currently. Patient reports she takes her medications but they're not helping with her symptoms. She has followup with her doctor on the 20th. Patient's last admission for symptoms was in March. She has a history of chronic pancreatitis  Past Medical History  Diagnosis Date  . Pancreatitis   . S/P laparoscopic cholecystectomy   . Fibroids   . Ovarian cyst   . Chronic abdominal pain   . Anemia     Past Surgical History  Procedure Date  . Cholecystectomy   . Abdominal hysterectomy   . Dilation and curettage of uterus     Family History  Problem Relation Age of Onset  . Diabetes Mother   . Hypertension Mother   . Hypertension Father   . Diabetes Father   . Asthma Daughter     History  Substance Use Topics  . Smoking status: Current Everyday Smoker -- 0.5 packs/day for 10 years    Types: Cigarettes  . Smokeless tobacco: Never Used   Comment: declined  . Alcohol Use: No    OB History    Grav Para Term Preterm Abortions TAB SAB Ect Mult Living   4 2 1 1 2  2          Review of Systems  All other systems reviewed and are negative.    Allergies  Celecoxib; Ketorolac tromethamine; Meperidine hcl; Coconut flavor; Zofran; Fentanyl; Fish allergy; Ibuprofen; Morphine; Propoxyphene-acetaminophen; and Tramadol  hcl  Home Medications   Current Outpatient Rx  Name Route Sig Dispense Refill  . DIPHENHYDRAMINE HCL 50 MG PO CAPS Oral Take 50 mg by mouth every 4 (four) hours as needed. For itching    . ESTRADIOL 2 MG PO TABS Oral Take 2 mg by mouth at bedtime.    Marland Kitchen METOCLOPRAMIDE HCL 10 MG PO TABS Oral Take 10 mg by mouth 4 (four) times daily as needed. nausea    . OXYCODONE HCL ER 10 MG PO TB12 Oral Take 10 mg by mouth 3 (three) times daily.    . OXYCODONE-ACETAMINOPHEN 5-325 MG PO TABS Oral Take 1-2 tablets by mouth every 6 (six) hours as needed for pain. 10 tablet 0    BP 121/75  Temp 98.3 F (36.8 C) (Oral)  Resp 18  SpO2 97%  LMP 02/04/2012  Physical Exam  Nursing note and vitals reviewed. Constitutional: She is oriented to person, place, and time. She appears well-developed and well-nourished.       No vomiting noted during her ED stay  HENT:  Head: Normocephalic and atraumatic.  Nose: Nose normal.  Mouth/Throat: Oropharynx is clear and moist.       Tongue and lips are moist  Eyes: Conjunctivae and EOM are normal. Pupils are equal, round, and reactive to light.  Neck: Normal range of motion. Neck supple. No JVD present. No tracheal deviation present. No thyromegaly present.  Cardiovascular: Normal rate,  regular rhythm, normal heart sounds and intact distal pulses.  Exam reveals no gallop and no friction rub.   No murmur heard. Pulmonary/Chest: Effort normal and breath sounds normal. No stridor. No respiratory distress. She has no wheezes. She has no rales. She exhibits no tenderness.  Abdominal: Soft. Bowel sounds are normal. She exhibits no distension and no mass. There is tenderness (diffuse tenderness however no rebound or guarding, non-acute abdomen). There is no rebound and no guarding.  Musculoskeletal: Normal range of motion. She exhibits no edema and no tenderness.  Lymphadenopathy:    She has no cervical adenopathy.  Neurological: She is alert and oriented to person, place,  and time. She exhibits normal muscle tone. Coordination normal.  Skin: Skin is warm and dry. No rash noted. No erythema. No pallor.  Psychiatric: She has a normal mood and affect. Her behavior is normal. Judgment and thought content normal.    ED Course  Procedures (including critical care time)   Labs Reviewed  LIPASE, BLOOD   No results found.   1. Abdominal pain       MDM  31 year old female with chronic abdominal pain. She reports nausea vomiting and diarrhea, however none seen here. Patient appears well-hydrated with benign abdominal exam. We'll check lipase and give rectal Phenergan to treat nausea. If lipase significantly elevated, may give parental analgesia.      3:01 AM He should has refused care by both myself and my PA. She is requesting another doctor. We are unable to assist her with her request at this time. Patient is well-known to the emergency department. Patient is unhappy with our plan to treat her nausea and headache. She is refusing Phenergan and Reglan. Patient does not be in any distress, she has moist mucous membranes, she has a soft nonacute abdomen. No active vomiting or diarrhea during her time in the emergency department. We'll discharge to followup with Dr. Bruna Potter on the 20th when she says that she has an appointment. I have advised her to let Dr. Bruna Potter know that she has been recommended to followup at [facility] for her ongoing chronic abdominal pain.  Mikayla Mackie, MD 02/19/12 [phone]

## 2012-02-21 ENCOUNTER — Emergency Department (HOSPITAL_COMMUNITY)
Admission: EM | Admit: 2012-02-21 | Discharge: 2012-02-21 | Disposition: A | Discharge status: Home / Self Care | Payer: Medicaid Other | Attending: Emergency Medicine | Admitting: Emergency Medicine

## 2012-02-21 ENCOUNTER — Encounter (HOSPITAL_COMMUNITY): Payer: Self-pay | Admitting: *Deleted

## 2012-02-21 ENCOUNTER — Emergency Department (HOSPITAL_COMMUNITY)
Admission: EM | Admit: 2012-02-21 | Discharge: 2012-02-22 | Disposition: A | Discharge status: Home / Self Care | Payer: Medicaid Other | Source: Home / Self Care | Attending: Emergency Medicine | Admitting: Emergency Medicine

## 2012-02-21 DIAGNOSIS — R109 Unspecified abdominal pain: Secondary | ICD-10-CM

## 2012-02-21 DIAGNOSIS — D649 Anemia, unspecified: Secondary | ICD-10-CM | POA: Insufficient documentation

## 2012-02-21 DIAGNOSIS — G8929 Other chronic pain: Secondary | ICD-10-CM

## 2012-02-21 DIAGNOSIS — K859 Acute pancreatitis without necrosis or infection, unspecified: Secondary | ICD-10-CM | POA: Insufficient documentation

## 2012-02-21 DIAGNOSIS — F17200 Nicotine dependence, unspecified, uncomplicated: Secondary | ICD-10-CM | POA: Insufficient documentation

## 2012-02-21 LAB — LIPASE, BLOOD: Lipase: 197 U/L — ABNORMAL HIGH (ref 11–59)

## 2012-02-21 MED ORDER — PROMETHAZINE HCL 25 MG/ML IJ SOLN
25.0000 mg | Freq: Once | INTRAMUSCULAR | Status: AC
  Administered 2012-02-21: 25 mg via INTRAMUSCULAR
  Filled 2012-02-21: qty 1

## 2012-02-21 MED ORDER — HYDROMORPHONE HCL PF 2 MG/ML IJ SOLN
2.0000 mg | Freq: Once | INTRAMUSCULAR | Status: DC
  Filled 2012-02-21 (×2): qty 1

## 2012-02-21 MED ORDER — HYDROMORPHONE HCL PF 2 MG/ML IJ SOLN
2.0000 mg | Freq: Once | INTRAMUSCULAR | Status: AC
  Administered 2012-02-21: 2 mg via INTRAMUSCULAR

## 2012-02-21 MED ORDER — DIPHENHYDRAMINE HCL 25 MG PO CAPS: 50.0000 mg | ORAL_CAPSULE | Freq: Once | ORAL | Status: DC

## 2012-02-21 MED ORDER — PROMETHAZINE HCL 25 MG/ML IJ SOLN
25.0000 mg | INTRAMUSCULAR | Status: DC
  Filled 2012-02-21: qty 1

## 2012-02-21 NOTE — ED Notes (Signed)
Pt states upper abd pain onset for about 4 weeks.  Reports nausea, vomiting and HA.

## 2012-02-21 NOTE — ED Notes (Signed)
PT refuses nurse to administer medication.  Pt wants all meds in one injection.  Pt is requesting another nurse to give meds.

## 2012-02-21 NOTE — ED Provider Notes (Signed)
History   This chart was scribed for Charles B. Bernette Mayers, MD by Melba Coon. The patient was seen in room TR08C/TR08C and the patient's care was started at 2300.    CSN: [id number]  Arrival date & time 02/21/12  2240   None     Chief Complaint  Patient presents with  . Abdominal Pain    (Consider location/radiation/quality/duration/timing/severity/associated sxs/prior treatment) HPI [name] is a 31 y.o. female who presents to the Emergency Department complaining of constant, moderate to severe, upper abdominal pain with associated nausea and vomit with an onset about 4 weeks ago. Pt was here an hour and a half ago and left after refusing IM meds. Pt decided to check back in. No other pertinent medical symptoms.  Past Medical History  Diagnosis Date  . Pancreatitis   . S/P laparoscopic cholecystectomy   . Fibroids   . Ovarian cyst   . Chronic abdominal pain   . Anemia     Past Surgical History  Procedure Date  . Cholecystectomy   . Abdominal hysterectomy   . Dilation and curettage of uterus     Family History  Problem Relation Age of Onset  . Diabetes Mother   . Hypertension Mother   . Hypertension Father   . Diabetes Father   . Asthma Daughter     History  Substance Use Topics  . Smoking status: Current Everyday Smoker -- 0.5 packs/day for 10 years    Types: Cigarettes  . Smokeless tobacco: Never Used   Comment: declined  . Alcohol Use: No    OB History    Grav Para Term Preterm Abortions TAB SAB Ect Mult Living   4 2 1 1 2  2          Review of Systems 10 Systems reviewed and all are negative for acute change except as noted in the HPI.   Allergies  Celecoxib; Ketorolac tromethamine; Meperidine hcl; Coconut flavor; Zofran; Fentanyl; Fish allergy; Ibuprofen; Morphine; Propoxyphene-acetaminophen; and Tramadol hcl  Home Medications   Current Outpatient Rx  Name Route Sig Dispense Refill  . DIPHENHYDRAMINE HCL 50 MG PO CAPS Oral Take 50 mg  by mouth every 4 (four) hours as needed. For itching    . ESTRADIOL 2 MG PO TABS Oral Take 2 mg by mouth at bedtime.    Marland Kitchen METOCLOPRAMIDE HCL 10 MG PO TABS Oral Take 10 mg by mouth 4 (four) times daily as needed. nausea    . OXYCODONE HCL ER 10 MG PO TB12 Oral Take 10 mg by mouth 3 (three) times daily.      BP 144/90  Pulse 95  Temp 98.6 F (37 C) (Oral)  Resp 18  SpO2 98%  LMP 02/04/2012  Physical Exam  Nursing note and vitals reviewed. Constitutional: She is oriented to person, place, and time. She appears well-developed and well-nourished.  HENT:  Head: Normocephalic and atraumatic.  Eyes: EOM are normal. Pupils are equal, round, and reactive to light.  Neck: Normal range of motion. Neck supple.  Cardiovascular: Normal rate, normal heart sounds and intact distal pulses.   Pulmonary/Chest: Effort normal and breath sounds normal.  Abdominal: Bowel sounds are normal. She exhibits no distension. There is no tenderness.  Musculoskeletal: Normal range of motion. She exhibits no edema and no tenderness.  Neurological: She is alert and oriented to person, place, and time. She has normal strength. No cranial nerve deficit or sensory deficit.  Skin: Skin is warm and dry. No rash noted.  Psychiatric:  She has a normal mood and affect.    ED Course  Procedures (including critical care time)  DIAGNOSTIC STUDIES: Oxygen Saturation is 98% on room air, normal by my interpretation.    COORDINATION OF CARE:     Labs Reviewed - No data to display No results found.   No diagnosis found.    MDM  Pt given IM medications and PO benadryl. Discharged to PCP followup.  I personally performed the services described in the documentation, which were scribed in my presence. The recorded information has been reviewed and considered.         Charles B. Bernette Mayers, MD 02/21/12 [phone]

## 2012-02-21 NOTE — ED Provider Notes (Addendum)
History   This chart was scribed for Charles B. Bernette Mayers, MD by Melba Coon. The patient was seen in room TR08C/TR08C and the patient's care was started at 9:25PM.    CSN: [id number]  Arrival date & time 02/21/12  2036   None     Chief Complaint  Patient presents with  . Abdominal Pain    (Consider location/radiation/quality/duration/timing/severity/associated sxs/prior treatment) The history is provided by the patient. No language interpreter was used.   [name] is a 31 y.o. female who presents to the Emergency Department complaining of constant, moderate to severe, upper abdominal pain with associated nausea and vomit with an onset about 4 weeks ago. Pt's last visit at the ED was 4 days ago for the present symptoms and had labs done. Pt also called her PCP today; pt states that she was supposed to hear from an on call doctor but never got a response back. Pt's vomit was bilious. HA present. No fever, neck pain, sore throat, rash, back pain, CP, SOB, diarrhea, dysuria, or extremity pain, edema, weakness, numbness, or tingling. No other pertinent medical symptoms.   Past Medical History  Diagnosis Date  . Pancreatitis   . S/P laparoscopic cholecystectomy   . Fibroids   . Ovarian cyst   . Chronic abdominal pain   . Anemia     Past Surgical History  Procedure Date  . Cholecystectomy   . Abdominal hysterectomy   . Dilation and curettage of uterus     Family History  Problem Relation Age of Onset  . Diabetes Mother   . Hypertension Mother   . Hypertension Father   . Diabetes Father   . Asthma Daughter     History  Substance Use Topics  . Smoking status: Current Everyday Smoker -- 0.5 packs/day for 10 years    Types: Cigarettes  . Smokeless tobacco: Never Used   Comment: declined  . Alcohol Use: No    OB History    Grav Para Term Preterm Abortions TAB SAB Ect Mult Living   4 2 1 1 2  2          Review of Systems  Gastrointestinal: Positive for  abdominal pain.   10 Systems reviewed and all are negative for acute change except as noted in the HPI.   Allergies  Celecoxib; Ketorolac tromethamine; Meperidine hcl; Coconut flavor; Zofran; Fentanyl; Fish allergy; Ibuprofen; Morphine; Propoxyphene-acetaminophen; and Tramadol hcl  Home Medications   Current Outpatient Rx  Name Route Sig Dispense Refill  . DIPHENHYDRAMINE HCL 50 MG PO CAPS Oral Take 50 mg by mouth every 4 (four) hours as needed. For itching    . ESTRADIOL 2 MG PO TABS Oral Take 2 mg by mouth at bedtime.    Marland Kitchen METOCLOPRAMIDE HCL 10 MG PO TABS Oral Take 10 mg by mouth 4 (four) times daily as needed. nausea    . OXYCODONE HCL ER 10 MG PO TB12 Oral Take 10 mg by mouth 3 (three) times daily.      BP 122/81  Pulse 113  Temp 98.7 F (37.1 C) (Oral)  Resp 16  SpO2 95%  LMP 02/04/2012  Physical Exam  Nursing note and vitals reviewed. Constitutional: She is oriented to person, place, and time. She appears well-developed and well-nourished.  HENT:  Head: Normocephalic and atraumatic.  Eyes: EOM are normal. Pupils are equal, round, and reactive to light.  Neck: Normal range of motion. Neck supple.  Cardiovascular: Normal rate, normal heart sounds and intact distal pulses.  Pulmonary/Chest: Effort normal and breath sounds normal.  Abdominal: Bowel sounds are normal. She exhibits no distension. There is tenderness (diffuse tendernerss). There is no rebound and no guarding.  Musculoskeletal: Normal range of motion. She exhibits no edema and no tenderness.  Neurological: She is alert and oriented to person, place, and time. She has normal strength. No cranial nerve deficit or sensory deficit.  Skin: Skin is warm and dry. No rash noted.  Psychiatric:       argumentative    ED Course  Procedures (including critical care time)  DIAGNOSTIC STUDIES: Oxygen Saturation is 95% on room air, adequate by my interpretation.    COORDINATION OF CARE:  9:30PM - new labs will not  be ordered for the pt; PO challenge will be given to the pt. 9:32PM - one of the nurses accidentally ordered a lipase for the pt. 9:45PM - recheck; lipase is slightly elevated; dilaudid and phenergan will be ordered for the pt.   Labs Reviewed  LIPASE, BLOOD - Abnormal; Notable for the following:    Lipase 197 (*)     All other components within normal limits   No results found.   No diagnosis found.    MDM  Lipase ordered in triage is slightly elevated but about at baseline. Her abdomen is benign. She has not had any vomiting in the ED. She was offered IM dilaudid and phenergan but refused and asked for her discharge papers because I was "getting on her fucking nerves". Pt has no concerns for acute abdomen, she appears well hydrated and has tolerated oral fluids in the ED.   I personally performed the services described in the documentation, which were scribed in my presence. The recorded information has been reviewed and considered.   Patient left before getting her discharge paperwork.    Charles B. Bernette Mayers, MD 02/21/12 2223  Bonnita Levan. Bernette Mayers, MD 03/23/12 [phone]

## 2012-02-21 NOTE — ED Notes (Signed)
Pt presents with abdominal pain and needs more evaluation.

## 2012-02-21 NOTE — ED Notes (Signed)
PT. WAS JUST DISCHARGED AT FAST TRACK , CHECK BACK IN AND STATED THAT CHARGE NURSE " BOBBY" ADVISED HER TO CHECK BACK IN.

## 2012-02-21 NOTE — ED Notes (Addendum)
Nurse entered pt's room to administer 2 mg Dilaudid and 25 mg of Phenegran - nurse scanned pt's armband and medication. When attempting to give meds - pt requested to see the charge nurse.  I asked if there was something I could help with and pt stated she wanted to have another doctor see her because she wanted and IV.  Stated she felt dehydrated and needed fluids.  I stated that I already spoke with the MD and they he did not want to put in an IV.  I asked if pt wanted the dilaudid and phenergan and pt stated, "no".  I observed pt walking out of the room.

## 2012-02-23 ENCOUNTER — Encounter (HOSPITAL_COMMUNITY): Payer: Self-pay | Admitting: Emergency Medicine

## 2012-02-23 ENCOUNTER — Emergency Department (HOSPITAL_COMMUNITY)
Admission: EM | Admit: 2012-02-23 | Discharge: 2012-02-23 | Disposition: A | Discharge status: Home / Self Care | Payer: Medicaid Other | Attending: Emergency Medicine | Admitting: Emergency Medicine

## 2012-02-23 DIAGNOSIS — Z885 Allergy status to narcotic agent status: Secondary | ICD-10-CM | POA: Insufficient documentation

## 2012-02-23 DIAGNOSIS — Z8249 Family history of ischemic heart disease and other diseases of the circulatory system: Secondary | ICD-10-CM | POA: Insufficient documentation

## 2012-02-23 DIAGNOSIS — Z833 Family history of diabetes mellitus: Secondary | ICD-10-CM | POA: Insufficient documentation

## 2012-02-23 DIAGNOSIS — R109 Unspecified abdominal pain: Secondary | ICD-10-CM | POA: Insufficient documentation

## 2012-02-23 DIAGNOSIS — G8929 Other chronic pain: Secondary | ICD-10-CM | POA: Insufficient documentation

## 2012-02-23 DIAGNOSIS — Z888 Allergy status to other drugs, medicaments and biological substances status: Secondary | ICD-10-CM | POA: Insufficient documentation

## 2012-02-23 DIAGNOSIS — Z825 Family history of asthma and other chronic lower respiratory diseases: Secondary | ICD-10-CM | POA: Insufficient documentation

## 2012-02-23 DIAGNOSIS — R51 Headache: Secondary | ICD-10-CM | POA: Insufficient documentation

## 2012-02-23 DIAGNOSIS — D649 Anemia, unspecified: Secondary | ICD-10-CM | POA: Insufficient documentation

## 2012-02-23 DIAGNOSIS — Z91013 Allergy to seafood: Secondary | ICD-10-CM | POA: Insufficient documentation

## 2012-02-23 DIAGNOSIS — F17200 Nicotine dependence, unspecified, uncomplicated: Secondary | ICD-10-CM | POA: Insufficient documentation

## 2012-02-23 DIAGNOSIS — Z91018 Allergy to other foods: Secondary | ICD-10-CM | POA: Insufficient documentation

## 2012-02-23 MED ORDER — HYDROMORPHONE HCL 2 MG PO TABS
2.0000 mg | ORAL_TABLET | Freq: Once | ORAL | Status: AC
  Administered 2012-02-23: 2 mg via ORAL
  Filled 2012-02-23: qty 1

## 2012-02-23 MED ORDER — METOCLOPRAMIDE HCL 5 MG/ML IJ SOLN
10.0000 mg | Freq: Once | INTRAMUSCULAR | Status: AC
  Administered 2012-02-23: 10 mg via INTRAMUSCULAR
  Filled 2012-02-23 (×2): qty 2

## 2012-02-23 MED ORDER — DIPHENHYDRAMINE HCL 50 MG/ML IJ SOLN: 25.0000 mg | Freq: Once | INTRAMUSCULAR | Status: DC

## 2012-02-23 MED ORDER — DIPHENHYDRAMINE HCL 50 MG/ML IJ SOLN
25.0000 mg | Freq: Once | INTRAMUSCULAR | Status: AC
  Administered 2012-02-23: 25 mg via INTRAMUSCULAR
  Filled 2012-02-23: qty 1

## 2012-02-23 NOTE — ED Provider Notes (Signed)
History     CSN: [id number]  Arrival date & time 02/23/12  0048   First MD Initiated Contact with Patient 02/23/12 0114      Chief Complaint  Patient presents with  . Headache    (Consider location/radiation/quality/duration/timing/severity/associated sxs/prior treatment) Patient is a 31 y.o. female presenting with headaches. The history is provided by the patient.  Headache    is in here with chronic headaches abdominal pain. Seen in the ED multiple times for same has been referred back to her primary care doctor. Headache is similar to what she has when she has elevated pancreatic enzymes. She notes mild nausea vomiting and diarrhea for the past 5 weeks. Has had multiple laboratory evaluations for this. No active vomiting now. Denies any neck pain or severe photophobia..  Past Medical History  Diagnosis Date  . Pancreatitis   . S/P laparoscopic cholecystectomy   . Fibroids   . Ovarian cyst   . Chronic abdominal pain   . Anemia     Past Surgical History  Procedure Date  . Cholecystectomy   . Abdominal hysterectomy   . Dilation and curettage of uterus     Family History  Problem Relation Age of Onset  . Diabetes Mother   . Hypertension Mother   . Hypertension Father   . Diabetes Father   . Asthma Daughter     History  Substance Use Topics  . Smoking status: Current Everyday Smoker -- 0.5 packs/day for 10 years    Types: Cigarettes  . Smokeless tobacco: Never Used   Comment: declined  . Alcohol Use: No    OB History    Grav Para Term Preterm Abortions TAB SAB Ect Mult Living   4 2 1 1 2  2          Review of Systems  Neurological: Positive for headaches.  All other systems reviewed and are negative.    Allergies  Celecoxib; Ketorolac tromethamine; Meperidine hcl; Coconut flavor; Zofran; Fentanyl; Fish allergy; Ibuprofen; Morphine; Propoxyphene-acetaminophen; and Tramadol hcl  Home Medications   Current Outpatient Rx  Name Route Sig Dispense  Refill  . DIPHENHYDRAMINE HCL 50 MG PO CAPS Oral Take 50 mg by mouth every 4 (four) hours as needed. For itching    . ESTRADIOL 2 MG PO TABS Oral Take 2 mg by mouth at bedtime.    Marland Kitchen METOCLOPRAMIDE HCL 10 MG PO TABS Oral Take 10 mg by mouth 4 (four) times daily as needed. nausea    . OXYCODONE HCL ER 10 MG PO TB12 Oral Take 10 mg by mouth 3 (three) times daily.      BP 116/72  Pulse 84  Temp 98.9 F (37.2 C) (Oral)  Resp 18  SpO2 100%  LMP 02/04/2012  Physical Exam  Nursing note and vitals reviewed. Constitutional: She is oriented to person, place, and time. She appears well-developed and well-nourished.  Non-toxic appearance. No distress.  HENT:  Head: Normocephalic and atraumatic.  Eyes: Conjunctivae, EOM and lids are normal. Pupils are equal, round, and reactive to light.  Neck: Normal range of motion. Neck supple. No tracheal deviation present. No Brudzinski's sign noted. No mass present.       No nuchal rigidity  Cardiovascular: Normal rate, regular rhythm and normal heart sounds.  Exam reveals no gallop.   No murmur heard. Pulmonary/Chest: Effort normal and breath sounds normal. No stridor. No respiratory distress. She has no decreased breath sounds. She has no wheezes. She has no rhonchi. She has no rales.  Abdominal: Soft. Normal appearance and bowel sounds are normal. She exhibits no distension. There is no tenderness. There is no rebound and no CVA tenderness.  Musculoskeletal: Normal range of motion. She exhibits no edema and no tenderness.  Neurological: She is alert and oriented to person, place, and time. She has normal strength. No cranial nerve deficit or sensory deficit. GCS eye subscore is 4. GCS verbal subscore is 5. GCS motor subscore is 6.  Skin: Skin is warm and dry. No abrasion and no rash noted.  Psychiatric: She has a normal mood and affect. Her speech is normal and behavior is normal.    ED Course  Procedures (including critical care time)  Labs Reviewed -  No data to display No results found.   No diagnosis found.    MDM  Patient given medications here for her headache. Explained to her that she no longer be receiving IM opiates. She was instructed to followup with her Dr.  No concern for subarachnoid hemorrhage or meningitis       Toy Baker, MD 02/23/12 [phone]

## 2012-02-23 NOTE — ED Notes (Signed)
Report given via EMS. Pt c/o headache for 5 weeks. Hx of migraines when her "enzymes go up". Pt was seen in [facility] yesterday and released with oxycodone with no relief. Pt has been N/V/D for 5 weeks. No emesis during transport. Pt ambulatory with no assistance during arrival. Initial VS 120/82 Pulse 70 RR 16 at 0025.  Last VS 134/62 Pulse 68 RR 16 at 0030.

## 2012-02-23 NOTE — ED Notes (Signed)
[id number]> Expected date:<BR> Expected time:<BR> Means of arrival:<BR> Comments:<BR> EMS/headache for 5 weeks

## 2012-02-25 ENCOUNTER — Encounter (HOSPITAL_COMMUNITY): Payer: Self-pay | Admitting: Emergency Medicine

## 2012-02-25 ENCOUNTER — Emergency Department (HOSPITAL_COMMUNITY)
Admission: EM | Admit: 2012-02-25 | Discharge: 2012-02-25 | Disposition: A | Discharge status: Home / Self Care | Payer: Medicaid Other | Attending: Emergency Medicine | Admitting: Emergency Medicine

## 2012-02-25 DIAGNOSIS — Z881 Allergy status to other antibiotic agents status: Secondary | ICD-10-CM | POA: Insufficient documentation

## 2012-02-25 DIAGNOSIS — Z8249 Family history of ischemic heart disease and other diseases of the circulatory system: Secondary | ICD-10-CM | POA: Insufficient documentation

## 2012-02-25 DIAGNOSIS — K861 Other chronic pancreatitis: Secondary | ICD-10-CM

## 2012-02-25 DIAGNOSIS — Z833 Family history of diabetes mellitus: Secondary | ICD-10-CM | POA: Insufficient documentation

## 2012-02-25 DIAGNOSIS — F17200 Nicotine dependence, unspecified, uncomplicated: Secondary | ICD-10-CM | POA: Insufficient documentation

## 2012-02-25 DIAGNOSIS — Z885 Allergy status to narcotic agent status: Secondary | ICD-10-CM | POA: Insufficient documentation

## 2012-02-25 DIAGNOSIS — Z91018 Allergy to other foods: Secondary | ICD-10-CM | POA: Insufficient documentation

## 2012-02-25 DIAGNOSIS — Z825 Family history of asthma and other chronic lower respiratory diseases: Secondary | ICD-10-CM | POA: Insufficient documentation

## 2012-02-25 DIAGNOSIS — Z888 Allergy status to other drugs, medicaments and biological substances status: Secondary | ICD-10-CM | POA: Insufficient documentation

## 2012-02-25 LAB — CBC WITH DIFFERENTIAL/PLATELET
Basophils Absolute: 0.1 10*3/uL (ref 0.0–0.1)
Basophils Relative: 1 % (ref 0–1)
Hemoglobin: 11.9 g/dL — ABNORMAL LOW (ref 12.0–15.0)
MCHC: 33.7 g/dL (ref 30.0–36.0)
Monocytes Relative: 8 % (ref 3–12)
Neutro Abs: 3 10*3/uL (ref 1.7–7.7)
Neutrophils Relative %: 48 % (ref 43–77)
Platelets: 272 10*3/uL (ref 150–400)
RBC: 4.4 MIL/uL (ref 3.87–5.11)

## 2012-02-25 LAB — HEPATIC FUNCTION PANEL
Albumin: 3.9 g/dL (ref 3.5–5.2)
Bilirubin, Direct: <0.1 mg/dL (ref 0.0–0.3)
Total Bilirubin: 0.5 mg/dL (ref 0.3–1.2)

## 2012-02-25 LAB — BASIC METABOLIC PANEL
GFR calc Af Amer: >90 mL/min (ref >90)
GFR calc non Af Amer: >90 mL/min (ref >90)
Potassium: 4 mEq/L (ref 3.5–5.1)
Sodium: 141 mEq/L (ref 135–145)

## 2012-02-25 LAB — LIPASE, BLOOD: Lipase: 68 U/L — ABNORMAL HIGH (ref 11–59)

## 2012-02-25 MED ORDER — HYDROMORPHONE HCL 2 MG PO TABS
2.0000 mg | ORAL_TABLET | Freq: Once | ORAL | Status: AC
  Administered 2012-02-25: 2 mg via ORAL
  Filled 2012-02-25: qty 1

## 2012-02-25 MED ORDER — ONDANSETRON 4 MG PO TBDP
4.0000 mg | ORAL_TABLET | Freq: Once | ORAL | Status: DC
  Filled 2012-02-25: qty 1

## 2012-02-25 NOTE — ED Notes (Signed)
Patient complaining of chronic abdominal pain caused by pancreatitis.  Patient also reports nausea, vomiting, and diarrhea.

## 2012-02-25 NOTE — ED Provider Notes (Signed)
History     CSN: [id number]  Arrival date & time 02/25/12  0003   First MD Initiated Contact with Patient 02/25/12 [phone]      Chief Complaint  Patient presents with  . Abdominal Pain    (Consider location/radiation/quality/duration/timing/severity/associated sxs/prior treatment) HPI Comments: Pt with chronic pancreatitis comes in with cc of headaches and abd pain. Pt states that she has been having headaches and abd pain for the past several days, and they intermittently gets better and worse. Pt's abd pain is epigastric and there is associated nausea and emesis. Pt also has been having some diarrhea. No recent AB. Headaches are frontal, with no specific aggravating or relieving factors. Pt is taking 2 mg po dilaudid, po and pr phenergen - and statesshe cant keep any meds down. Pt's last emesis was prior to ED arrival - and she had been in the ER for 5+ hours prior to my evaluation.    Patient is a 31 y.o. female presenting with abdominal pain. The history is provided by the patient.  Abdominal Pain The primary symptoms of the illness include abdominal pain, fatigue, nausea, vomiting and diarrhea. The primary symptoms of the illness do not include shortness of breath or dysuria.  Symptoms associated with the illness do not include constipation or hematuria.    Past Medical History  Diagnosis Date  . Pancreatitis   . S/P laparoscopic cholecystectomy   . Fibroids   . Ovarian cyst   . Chronic abdominal pain   . Anemia     Past Surgical History  Procedure Date  . Cholecystectomy   . Abdominal hysterectomy   . Dilation and curettage of uterus     Family History  Problem Relation Age of Onset  . Diabetes Mother   . Hypertension Mother   . Hypertension Father   . Diabetes Father   . Asthma Daughter     History  Substance Use Topics  . Smoking status: Current Everyday Smoker -- 0.5 packs/day for 10 years    Types: Cigarettes  . Smokeless tobacco: Never Used   Comment: declined  . Alcohol Use: No    OB History    Grav Para Term Preterm Abortions TAB SAB Ect Mult Living   4 2 1 1 2  2          Review of Systems  Constitutional: Positive for fatigue. Negative for activity change.  HENT: Negative for facial swelling and neck pain.   Respiratory: Negative for cough, shortness of breath and wheezing.   Cardiovascular: Negative for chest pain.  Gastrointestinal: Positive for nausea, vomiting, abdominal pain and diarrhea. Negative for constipation, blood in stool and abdominal distention.  Genitourinary: Negative for dysuria, hematuria and difficulty urinating.  Skin: Negative for color change.  Neurological: Negative for speech difficulty and headaches.  Hematological: Does not bruise/bleed easily.  Psychiatric/Behavioral: Negative for confusion.    Allergies  Celecoxib; Ketorolac tromethamine; Meperidine hcl; Coconut flavor; Zofran; Fentanyl; Fish allergy; Ibuprofen; Morphine; Propoxyphene-acetaminophen; and Tramadol hcl  Home Medications   Current Outpatient Rx  Name Route Sig Dispense Refill  . DIPHENHYDRAMINE HCL 50 MG PO CAPS Oral Take 50 mg by mouth every 4 (four) hours as needed. For itching    . ESTRADIOL 2 MG PO TABS Oral Take 2 mg by mouth at bedtime.    Marland Kitchen METOCLOPRAMIDE HCL 10 MG PO TABS Oral Take 10 mg by mouth 4 (four) times daily as needed. nausea    . OXYCODONE HCL ER 10 MG PO TB12 Oral  Take 10 mg by mouth 3 (three) times daily.      BP 111/69  Pulse 92  Temp 98 F (36.7 C) (Oral)  Resp 18  SpO2 99%  LMP 02/04/2012  Physical Exam  Nursing note and vitals reviewed. Constitutional: She is oriented to person, place, and time. She appears well-developed and well-nourished.  HENT:  Head: Normocephalic and atraumatic.  Eyes: Conjunctivae and EOM are normal. Pupils are equal, round, and reactive to light.  Neck: Normal range of motion. Neck supple.  Cardiovascular: Normal rate, regular rhythm and normal heart sounds.     No murmur heard. Pulmonary/Chest: Effort normal and breath sounds normal. No respiratory distress.  Abdominal: Soft. Bowel sounds are normal. She exhibits no distension. There is tenderness. There is no rebound and no guarding.  Neurological: She is alert and oriented to person, place, and time. No cranial nerve deficit. Coordination normal.  Skin: Skin is warm and dry.    ED Course  Procedures (including critical care time)  Labs Reviewed  CBC WITH DIFFERENTIAL - Abnormal; Notable for the following:    Hemoglobin 11.9 (*)     HCT 35.3 (*)     All other components within normal limits  LIPASE, BLOOD - Abnormal; Notable for the following:    Lipase 68 (*)     All other components within normal limits  HEPATIC FUNCTION PANEL  BASIC METABOLIC PANEL  URINALYSIS, ROUTINE W REFLEX MICROSCOPIC   No results found.   1. Chronic pancreatitis       MDM  Pt comes in with cc of pancreatitis. Pt has chronic pancreatitis, 52 visits to the ED within 6 months. Patient's HR at my evaluation is 92. She is resting comfortably, no emesis in the room she has been - and there is not even an emesis bag next to her, which makes me think she has no nausea at all. Her exam was non surgical. Neuro exam is benign. It was 6:30 am, i informed patient that she should see her primary - and she wanted labs to be drawn to be absolutely sure about her condition. She also refused po meds.  Lipase is <100. Still no emesis. i shared the rsults with the patient. Patent is requesting po meds at discharge -which were provided. Rest of the labs show no elye abnormalities, ketones in the urine etc. Which would have been expected if there was severe dehydration state that patient feels she is in.        Derwood Kaplan, MD 02/25/12 0800

## 2012-02-25 NOTE — ED Notes (Signed)
Patient refusing blood work unless it comes from an IV start; informed patient that IVs are not allowed to be started in triage.  Patient verbalized understanding.

## 2012-02-25 NOTE — ED Notes (Signed)
Pt resting in bed, with family at bedside.  As I walked in the room, she asked, "What is the doctor going to do for her.  Explained the plan of care for lab results, that we need to collect a urine sample and VS.  Patient reports she is unable to provide urine at this time.

## 2012-02-25 NOTE — ED Notes (Signed)
Patient states that she is having her normal abd pain and nausea.  Has at home Dilaudid 2mg  tabs, liquid Benadryl and Phenergan PO and PR

## 2012-02-29 ENCOUNTER — Encounter (HOSPITAL_COMMUNITY): Payer: Self-pay | Admitting: Emergency Medicine

## 2012-02-29 ENCOUNTER — Emergency Department (HOSPITAL_COMMUNITY)
Admission: EM | Admit: 2012-02-29 | Discharge: 2012-03-01 | Discharge status: Against Medical Advice | Payer: Medicaid Other | Attending: Emergency Medicine | Admitting: Emergency Medicine

## 2012-02-29 DIAGNOSIS — F17200 Nicotine dependence, unspecified, uncomplicated: Secondary | ICD-10-CM | POA: Insufficient documentation

## 2012-02-29 DIAGNOSIS — R112 Nausea with vomiting, unspecified: Secondary | ICD-10-CM | POA: Insufficient documentation

## 2012-02-29 DIAGNOSIS — R109 Unspecified abdominal pain: Secondary | ICD-10-CM

## 2012-02-29 DIAGNOSIS — G8929 Other chronic pain: Secondary | ICD-10-CM | POA: Insufficient documentation

## 2012-02-29 DIAGNOSIS — R1013 Epigastric pain: Secondary | ICD-10-CM | POA: Insufficient documentation

## 2012-02-29 MED ORDER — METOCLOPRAMIDE HCL 5 MG/ML IJ SOLN: 10.0000 mg | Freq: Once | INTRAMUSCULAR | Status: DC

## 2012-02-29 NOTE — ED Notes (Signed)
Pt reports she is here for pancreatic pain; RN reported she was getting reglan IM. PT wants to see MD.

## 2012-02-29 NOTE — ED Notes (Signed)
PT. REPORTS PERSISTENT GENERALIZED ABDOMINAL PAIN FOR SEVERAL YEARS WITH NAUSEA AND VOMITTING .

## 2012-02-29 NOTE — ED Notes (Addendum)
Rn reiterated POC for pt; pt requesting to see Consulting civil engineer; charge RN made aware.

## 2012-03-01 NOTE — ED Notes (Signed)
Charge RN talking with pt; RN asked pt if she wanted something to drink. Pt refused. Pt's friend reported that RN "was dismissed" and that "he hated white, motherfucking [employer]". PT and family leaving department with Charge RN walking them out.

## 2012-03-01 NOTE — ED Provider Notes (Signed)
History     CSN: [id number]  Arrival date & time 02/29/12  2005   First MD Initiated Contact with Patient 02/29/12 2300      Chief Complaint  Patient presents with  . Abdominal Pain    (Consider location/radiation/quality/duration/timing/severity/associated sxs/prior treatment) HPI Hx provided by PT.  ABD pain and N/V with longstanding h/o same. HAs chronic pancreatitis. PT states she can not take her pain pills at home because she throws up everything she tries to swallow.  SHe also states that her PCP told her to come here if she is not feeling well.  She denies any F/C. No diarrhea. No blood in emesis. Pain is sharp and located epigastric region. No trauma, no rash no change in character or quality of her chronic symptoms.  Past Medical History  Diagnosis Date  . Pancreatitis   . S/P laparoscopic cholecystectomy   . Fibroids   . Ovarian cyst   . Chronic abdominal pain   . Anemia     Past Surgical History  Procedure Date  . Cholecystectomy   . Abdominal hysterectomy   . Dilation and curettage of uterus     Family History  Problem Relation Age of Onset  . Diabetes Mother   . Hypertension Mother   . Hypertension Father   . Diabetes Father   . Asthma Daughter     History  Substance Use Topics  . Smoking status: Current Everyday Smoker -- 0.5 packs/day for 10 years    Types: Cigarettes  . Smokeless tobacco: Never Used   Comment: declined  . Alcohol Use: No    OB History    Grav Para Term Preterm Abortions TAB SAB Ect Mult Living   4 2 1 1 2  2          Review of Systems  Constitutional: Negative for fever and chills.  HENT: Negative for neck pain and neck stiffness.   Eyes: Negative for pain.  Respiratory: Negative for shortness of breath.   Cardiovascular: Negative for chest pain.  Gastrointestinal: Positive for nausea, vomiting and abdominal pain.  Genitourinary: Negative for dysuria.  Musculoskeletal: Negative for back pain.  Skin: Negative for rash.   Neurological: Negative for headaches.  All other systems reviewed and are negative.    Allergies  Celecoxib; Ketorolac tromethamine; Meperidine hcl; Coconut flavor; Zofran; Fentanyl; Fish allergy; Ibuprofen; Morphine; Propoxyphene-acetaminophen; and Tramadol hcl  Home Medications   Current Outpatient Rx  Name Route Sig Dispense Refill  . DIPHENHYDRAMINE HCL 50 MG PO CAPS Oral Take 50 mg by mouth every 4 (four) hours as needed. For itching    . ESTRADIOL 2 MG PO TABS Oral Take 2 mg by mouth at bedtime.    Marland Kitchen METOCLOPRAMIDE HCL 10 MG PO TABS Oral Take 10 mg by mouth 4 (four) times daily as needed. nausea    . OXYCODONE HCL ER 10 MG PO TB12 Oral Take 10 mg by mouth 3 (three) times daily.      LMP 02/04/2012  Physical Exam  Constitutional: She is oriented to person, place, and time. She appears well-developed and well-nourished.  HENT:  Head: Normocephalic and atraumatic.       mmm  Eyes: Conjunctivae and EOM are normal. Pupils are equal, round, and reactive to light.  Neck: Trachea normal. Neck supple. No thyromegaly present.  Cardiovascular: Normal rate, regular rhythm, S1 normal, S2 normal and normal pulses.     No systolic murmur is present   No diastolic murmur is present  Pulses:  Radial pulses are 2+ on the right side, and 2+ on the left side.  Pulmonary/Chest: Effort normal and breath sounds normal. She has no wheezes. She has no rhonchi. She has no rales. She exhibits no tenderness.  Abdominal: Soft. Normal appearance and bowel sounds are normal. There is no CVA tenderness and negative Murphy's sign.       Epigastric TTP, soft and nontender otherwise.   Musculoskeletal:       Good cap refill, no c/c/e  Neurological: She is alert and oriented to person, place, and time. She has normal strength. No cranial nerve deficit or sensory deficit. GCS eye subscore is 4. GCS verbal subscore is 5. GCS motor subscore is 6.  Skin: Skin is warm and dry. No rash noted. She is not  diaphoretic.  Psychiatric: Her speech is normal.    ED Course  Procedures (including critical care time)  IM Reglan and PO fluid challenge   PT very upset and demanding an IV for fluids despite a long conversation where I explained that she did not have any clinical indication for IVFs -mmm, good cap refill, VS WNL. PT declines further treatment and left AMA without paperwork.  PT has meds at home and PCP follow up.   MDM   VS and nursing notes reviewed. Old records reviewed - has chronic pain and symptoms with multiple ED visits for the same symptoms. Left AMA        Sunnie Nielsen, MD 03/01/12 [phone]

## 2012-03-01 NOTE — ED Notes (Signed)
Charge RN to see pt at this time.

## 2012-03-01 NOTE — ED Notes (Signed)
Called to room as CN, into room to speak with pt: plan, process, wait, meds & treating sx explained. Verbalizes understanding. Pt unhappy and not agreeable. Family (4 others) not agreeable. Pt did not want to wait to see EDP again or wait for d/c paperwork. Pt declined meds or PO fluids.

## 2012-03-03 ENCOUNTER — Emergency Department (HOSPITAL_COMMUNITY)
Admission: EM | Admit: 2012-03-03 | Discharge: 2012-03-03 | Disposition: A | Discharge status: Home / Self Care | Payer: Medicaid Other | Attending: Emergency Medicine | Admitting: Emergency Medicine

## 2012-03-03 ENCOUNTER — Encounter (HOSPITAL_COMMUNITY): Payer: Self-pay

## 2012-03-03 DIAGNOSIS — K859 Acute pancreatitis without necrosis or infection, unspecified: Secondary | ICD-10-CM | POA: Insufficient documentation

## 2012-03-03 DIAGNOSIS — D649 Anemia, unspecified: Secondary | ICD-10-CM | POA: Insufficient documentation

## 2012-03-03 DIAGNOSIS — F17200 Nicotine dependence, unspecified, uncomplicated: Secondary | ICD-10-CM | POA: Insufficient documentation

## 2012-03-03 DIAGNOSIS — R109 Unspecified abdominal pain: Secondary | ICD-10-CM | POA: Insufficient documentation

## 2012-03-03 DIAGNOSIS — G8929 Other chronic pain: Secondary | ICD-10-CM | POA: Insufficient documentation

## 2012-03-03 MED ORDER — HYDROXYZINE HCL 25 MG PO TABS
25.0000 mg | ORAL_TABLET | Freq: Once | ORAL | Status: AC
  Administered 2012-03-03: 25 mg via ORAL
  Filled 2012-03-03: qty 1

## 2012-03-03 MED ORDER — HYDROMORPHONE HCL 2 MG PO TABS
2.0000 mg | ORAL_TABLET | Freq: Once | ORAL | Status: AC
  Administered 2012-03-03: 2 mg via ORAL
  Filled 2012-03-03: qty 1

## 2012-03-03 NOTE — ED Notes (Signed)
Pt from home.  C/O abd pain.  N/v.  States she has thrown up over 20 times today. Last at 8pm.  Chronic hx of same.  Hx pancreatitis.  C/O headache with light sensitivity.  Took Benadryl, reglan and percocet at home with no relief.  Multiple allergies. Hx Munchausen Syndrome.  Vitals  124/64, hr 86, resp 18 ra.  Pain 8/10.

## 2012-03-03 NOTE — ED Provider Notes (Signed)
History     CSN: [id number]  Arrival date & time 03/03/12  0145   First MD Initiated Contact with Patient 03/03/12 0309      Chief Complaint  Patient presents with  . Abdominal Pain    (Consider location/radiation/quality/duration/timing/severity/associated sxs/prior treatment) HPI Comments: 31 year old female with chronic abdominal pain who has been to the emergency department over 10 times in the month of August is with ongoing epigastric pain. The patient does have a history of pancreatitis, she states that the pain is persistent, nothing makes it better or worse, she has been able to eat but does admit to having occasional vomiting today. She denies diarrhea, dysuria, fever, chills, cough, shortness of breath. This pain is exactly the same as the pain that she usually has.  Patient is a 31 y.o. female presenting with abdominal pain. The history is provided by the patient and medical records.  Abdominal Pain The primary symptoms of the illness include abdominal pain.    Past Medical History  Diagnosis Date  . Pancreatitis   . S/P laparoscopic cholecystectomy   . Fibroids   . Ovarian cyst   . Chronic abdominal pain   . Anemia     Past Surgical History  Procedure Date  . Cholecystectomy   . Abdominal hysterectomy   . Dilation and curettage of uterus     Family History  Problem Relation Age of Onset  . Diabetes Mother   . Hypertension Mother   . Hypertension Father   . Diabetes Father   . Asthma Daughter     History  Substance Use Topics  . Smoking status: Current Everyday Smoker -- 0.5 packs/day for 10 years    Types: Cigarettes  . Smokeless tobacco: Never Used   Comment: declined  . Alcohol Use: No    OB History    Grav Para Term Preterm Abortions TAB SAB Ect Mult Living   4 2 1 1 2  2          Review of Systems  Gastrointestinal: Positive for abdominal pain.  All other systems reviewed and are negative.    Allergies  Celecoxib; Ketorolac  tromethamine; Meperidine hcl; Coconut flavor; Zofran; Fentanyl; Fish allergy; Ibuprofen; Morphine; Propoxyphene-acetaminophen; and Tramadol hcl  Home Medications   Current Outpatient Rx  Name Route Sig Dispense Refill  . DIPHENHYDRAMINE HCL 50 MG PO CAPS Oral Take 50 mg by mouth every 4 (four) hours as needed. For itching    . ESTRADIOL 2 MG PO TABS Oral Take 2 mg by mouth at bedtime.    Marland Kitchen METOCLOPRAMIDE HCL 10 MG PO TABS Oral Take 10 mg by mouth 4 (four) times daily as needed. nausea    . OXYCODONE HCL ER 10 MG PO TB12 Oral Take 10 mg by mouth 3 (three) times daily.      BP 122/83  Pulse 86  Temp 97.5 F (36.4 C) (Oral)  Resp 24  Ht 5\' 5"  (1.651 m)  Wt 164 lb (74.39 kg)  BMI 27.29 kg/m2  SpO2 99%  LMP 02/04/2012  Physical Exam  Nursing note and vitals reviewed. Constitutional: She appears well-developed and well-nourished. No distress.  HENT:  Head: Normocephalic and atraumatic.  Mouth/Throat: Oropharynx is clear and moist. No oropharyngeal exudate.  Eyes: Conjunctivae and EOM are normal. Pupils are equal, round, and reactive to light. Right eye exhibits no discharge. Left eye exhibits no discharge. No scleral icterus.  Neck: Normal range of motion. Neck supple. No JVD present. No thyromegaly present.  Cardiovascular: Normal  rate, regular rhythm, normal heart sounds and intact distal pulses.  Exam reveals no gallop and no friction rub.   No murmur heard. Pulmonary/Chest: Effort normal and breath sounds normal. No respiratory distress. She has no wheezes. She has no rales.  Abdominal: Soft. Bowel sounds are normal. She exhibits no distension and no mass. There is tenderness ( Epigastric tenderness without guarding, no peritoneal signs, no lower abdominal tenderness including McBurney's point).  Musculoskeletal: Normal range of motion. She exhibits no edema and no tenderness.  Lymphadenopathy:    She has no cervical adenopathy.  Neurological: She is alert. Coordination normal.    Skin: Skin is warm and dry. No rash noted. No erythema.  Psychiatric: She has a normal mood and affect. Her behavior is normal.    ED Course  Procedures (including critical care time)  Labs Reviewed - No data to display No results found.   1. Chronic abdominal pain       MDM  The patient has a normal respiratory rate, normal vital signs on my exam. She has epigastric tenderness which is reproducible, she does have a history of coming frequently to the emergency department looking for pain medication. I will give her one dose of pain medication but we'll not give her a prescription, she appears stable for discharge. She has moist mucous membranes, no tachycardia and no hypotension, she does not appear dehydrated        Vida Roller, MD 03/03/12 [phone]

## 2012-03-04 ENCOUNTER — Emergency Department: Payer: Self-pay | Admitting: Emergency Medicine

## 2012-03-05 ENCOUNTER — Emergency Department (HOSPITAL_COMMUNITY)
Admission: EM | Admit: 2012-03-05 | Discharge: 2012-03-06 | Discharge status: Against Medical Advice | Payer: Medicaid Other | Attending: Emergency Medicine | Admitting: Emergency Medicine

## 2012-03-05 ENCOUNTER — Encounter (HOSPITAL_COMMUNITY): Payer: Self-pay | Admitting: Emergency Medicine

## 2012-03-05 DIAGNOSIS — K861 Other chronic pancreatitis: Secondary | ICD-10-CM | POA: Insufficient documentation

## 2012-03-05 LAB — CBC
HCT: 41 % (ref 35.0–47.0)
HGB: 14 g/dL (ref 12.0–16.0)
MCV: 82 fL (ref 80–100)
RBC: 4.99 10*6/uL (ref 3.80–5.20)
RDW: 13.1 % (ref 11.5–14.5)
WBC: 8.4 10*3/uL (ref 3.6–11.0)

## 2012-03-05 LAB — COMPREHENSIVE METABOLIC PANEL
Alkaline Phosphatase: 90 U/L (ref 50–136)
Anion Gap: 5 — ABNORMAL LOW (ref 7–16)
BUN: 19 mg/dL — ABNORMAL HIGH (ref 7–18)
Calcium, Total: 9.1 mg/dL (ref 8.5–10.1)
EGFR (African American): >60
EGFR (Non-African Amer.): >60
Glucose: 99 mg/dL (ref 65–99)
SGOT(AST): 34 U/L (ref 15–37)
SGPT (ALT): 48 U/L (ref 12–78)
Sodium: 139 mmol/L (ref 136–145)
Total Protein: 9 g/dL — ABNORMAL HIGH (ref 6.4–8.2)

## 2012-03-05 NOTE — ED Notes (Signed)
Pt alert, arrives from home, c/o "Pancreatitis", onset ?, pt states seen in "[facility]", returned to [facility] for treatment, resp even unlabored, skin pwd

## 2012-03-06 NOTE — ED Notes (Signed)
[id number]> Expected date:<BR> Expected time:<BR> Means of arrival:<BR> Comments:<BR> Hold per charge

## 2012-03-06 NOTE — ED Notes (Addendum)
This writer attempted to draw labs and patient refused second attempt unless IV start.

## 2012-03-06 NOTE — ED Notes (Signed)
Pt. Refused urine sample. 

## 2012-03-10 ENCOUNTER — Encounter (HOSPITAL_COMMUNITY): Payer: Self-pay | Admitting: Emergency Medicine

## 2012-03-10 ENCOUNTER — Emergency Department (HOSPITAL_COMMUNITY)
Admission: EM | Admit: 2012-03-10 | Discharge: 2012-03-10 | Disposition: A | Discharge status: Home / Self Care | Payer: Medicaid Other | Attending: Emergency Medicine | Admitting: Emergency Medicine

## 2012-03-10 DIAGNOSIS — R1013 Epigastric pain: Secondary | ICD-10-CM | POA: Insufficient documentation

## 2012-03-10 DIAGNOSIS — Z9089 Acquired absence of other organs: Secondary | ICD-10-CM | POA: Insufficient documentation

## 2012-03-10 DIAGNOSIS — Z91013 Allergy to seafood: Secondary | ICD-10-CM | POA: Insufficient documentation

## 2012-03-10 DIAGNOSIS — G8929 Other chronic pain: Secondary | ICD-10-CM | POA: Insufficient documentation

## 2012-03-10 DIAGNOSIS — F17200 Nicotine dependence, unspecified, uncomplicated: Secondary | ICD-10-CM | POA: Insufficient documentation

## 2012-03-10 DIAGNOSIS — R197 Diarrhea, unspecified: Secondary | ICD-10-CM | POA: Insufficient documentation

## 2012-03-10 DIAGNOSIS — R109 Unspecified abdominal pain: Secondary | ICD-10-CM

## 2012-03-10 DIAGNOSIS — R110 Nausea: Secondary | ICD-10-CM | POA: Insufficient documentation

## 2012-03-10 LAB — COMPREHENSIVE METABOLIC PANEL
ALT: 24 U/L (ref 0–35)
Albumin: 3.6 g/dL (ref 3.5–5.2)
Alkaline Phosphatase: 63 U/L (ref 39–117)
BUN: 12 mg/dL (ref 6–23)
Chloride: 107 mEq/L (ref 96–112)
Glucose, Bld: 87 mg/dL (ref 70–99)
Potassium: 4.3 mEq/L (ref 3.5–5.1)
Sodium: 140 mEq/L (ref 135–145)
Total Bilirubin: 0.3 mg/dL (ref 0.3–1.2)

## 2012-03-10 LAB — CBC WITH DIFFERENTIAL/PLATELET
Basophils Relative: 1 % (ref 0–1)
Hemoglobin: 12 g/dL (ref 12.0–15.0)
Lymphs Abs: 3 10*3/uL (ref 0.7–4.0)
Monocytes Relative: 7 % (ref 3–12)
Neutro Abs: 2.4 10*3/uL (ref 1.7–7.7)
Neutrophils Relative %: 38 % — ABNORMAL LOW (ref 43–77)
Platelets: 241 10*3/uL (ref 150–400)
RBC: 4.37 MIL/uL (ref 3.87–5.11)

## 2012-03-10 LAB — LIPASE, BLOOD: Lipase: 145 U/L — ABNORMAL HIGH (ref 11–59)

## 2012-03-10 NOTE — ED Provider Notes (Signed)
History     CSN: [id number]  Arrival date & time 03/10/12  4098   First MD Initiated Contact with Patient 03/10/12 [phone]      Chief Complaint  Patient presents with  . Abdominal Pain    (Consider location/radiation/quality/duration/timing/severity/associated sxs/prior treatment) HPI Comments: Patient with hx pancreatitis and chronic abdominal pain, with multiple visits to the ED for same presents with her typical symptoms of epigastric pain and nausea.  States the symptoms have been going on for one month.  She has also had "diarrhea" three times daily for a month.  States she was seen at [facility] on Aug 31 and had an elevated lipase of 404, states they were going to admit her but she didn't want to be admitted so she left.  States this feels exactly like her chronic pain.  Denies any vomiting, fevers, urinary or vaginal symptoms.  S/P partial hysterectomy, cholecystectomy.    Patient is a 31 y.o. female presenting with abdominal pain. The history is provided by the patient.  Abdominal Pain The primary symptoms of the illness include abdominal pain, nausea and diarrhea. The primary symptoms of the illness do not include fever, shortness of breath, vomiting, dysuria, vaginal discharge or vaginal bleeding.  Symptoms associated with the illness do not include chills, constipation, urgency or frequency.    Past Medical History  Diagnosis Date  . Pancreatitis   . S/P laparoscopic cholecystectomy   . Fibroids   . Ovarian cyst   . Chronic abdominal pain   . Anemia     Past Surgical History  Procedure Date  . Cholecystectomy   . Abdominal hysterectomy   . Dilation and curettage of uterus     Family History  Problem Relation Age of Onset  . Diabetes Mother   . Hypertension Mother   . Hypertension Father   . Diabetes Father   . Asthma Daughter     History  Substance Use Topics  . Smoking status: Current Everyday Smoker -- 0.5 packs/day for 10 years    Types: Cigarettes  .  Smokeless tobacco: Never Used   Comment: declined  . Alcohol Use: No    OB History    Grav Para Term Preterm Abortions TAB SAB Ect Mult Living   4 2 1 1 2  2          Review of Systems  Constitutional: Negative for fever and chills.  HENT: Negative for sore throat.   Respiratory: Negative for shortness of breath.   Cardiovascular: Negative for chest pain.  Gastrointestinal: Positive for nausea, abdominal pain and diarrhea. Negative for vomiting and constipation.  Genitourinary: Negative for dysuria, urgency, frequency, vaginal bleeding and vaginal discharge.    Allergies  Celecoxib; Ketorolac tromethamine; Meperidine hcl; Coconut flavor; Zofran; Fentanyl; Fish allergy; Ibuprofen; Morphine; Propoxyphene-acetaminophen; and Tramadol hcl  Home Medications   Current Outpatient Rx  Name Route Sig Dispense Refill  . DIPHENHYDRAMINE HCL 50 MG PO CAPS Oral Take 50 mg by mouth every 4 (four) hours as needed. For itching    . ESTRADIOL 2 MG PO TABS Oral Take 2 mg by mouth at bedtime.    Marland Kitchen METOCLOPRAMIDE HCL 10 MG PO TABS Oral Take 10 mg by mouth 4 (four) times daily as needed. nausea      BP 113/81  Pulse 65  Temp 97.2 F (36.2 C) (Oral)  Resp 16  SpO2 97%  LMP 02/04/2012  Physical Exam  Nursing note and vitals reviewed. Constitutional: She appears well-developed and well-nourished. No distress.  HENT:  Head: Normocephalic and atraumatic.  Mouth/Throat: Mucous membranes are not dry.  Neck: Neck supple.  Cardiovascular: Normal rate and regular rhythm.   Pulmonary/Chest: Effort normal and breath sounds normal. No respiratory distress. She has no wheezes. She has no rales.  Abdominal: Soft. She exhibits no distension. There is tenderness in the epigastric area. There is no rigidity, no rebound and no guarding.       Mild epigastric tenderness   Neurological: She is alert.  Skin: She is not diaphoretic.    ED Course  Procedures (including critical care time)  Labs Reviewed    CBC WITH DIFFERENTIAL - Abnormal; Notable for the following:    HCT 35.6 (*)     Neutrophils Relative 38 (*)     Lymphocytes Relative 47 (*)     Eosinophils Relative 7 (*)     All other components within normal limits  LIPASE, BLOOD - Abnormal; Notable for the following:    Lipase 145 (*)     All other components within normal limits  COMPREHENSIVE METABOLIC PANEL  PREGNANCY, URINE   No results found.   Pt discussed with Dr Silverio Lay.  Plan is for redrawing of labs.  If patient's lipase is elevated beyond her baseline, will treat.  Pt comes into the ED very often and is well known to the ED staff.  She is well-hydrated appearing and looks to be comfortable and in no distress.  Has an appointment with PCP in 3 days.    1. Chronic abdominal pain       MDM  Pt with hx chronic abdominal pain and pancreatitis, well known to ED, presents with same.  Pt is afebrile, nontoxic, well-hydrated, comfortable appearing.   Lipase is 145, which appears to be around her baseline.  D/C home with PCP follow up.  Discussed all results with patient.  Pt given return precautions.  Pt verbalizes understanding and agrees with plan.           [place], [place] 03/10/12 [phone]

## 2012-03-10 NOTE — ED Provider Notes (Signed)
Medical screening examination/treatment/procedure(s) were performed by non-physician practitioner and as supervising physician I was immediately available for consultation/collaboration.   Richardean Canal, MD 03/10/12 [phone]

## 2012-03-10 NOTE — ED Notes (Signed)
abd pain x 1 month went to [facility] er end of aug abd she had pancreatitis they wanted to keep her but she did not want to stay has had n/v/d

## 2012-03-17 ENCOUNTER — Encounter (HOSPITAL_COMMUNITY): Payer: Self-pay | Admitting: Emergency Medicine

## 2012-03-17 ENCOUNTER — Emergency Department (HOSPITAL_COMMUNITY)
Admission: EM | Admit: 2012-03-17 | Discharge: 2012-03-17 | Discharge status: Against Medical Advice | Payer: Medicaid Other | Attending: Emergency Medicine | Admitting: Emergency Medicine

## 2012-03-17 DIAGNOSIS — R109 Unspecified abdominal pain: Secondary | ICD-10-CM | POA: Insufficient documentation

## 2012-03-17 LAB — CBC WITH DIFFERENTIAL/PLATELET
Basophils Absolute: 0 10*3/uL (ref 0.0–0.1)
Basophils Relative: 1 % (ref 0–1)
Eosinophils Absolute: 0.4 10*3/uL (ref 0.0–0.7)
Eosinophils Relative: 6 % — ABNORMAL HIGH (ref 0–5)
HCT: 36.2 % (ref 36.0–46.0)
MCH: 27.9 pg (ref 26.0–34.0)
MCHC: 34.5 g/dL (ref 30.0–36.0)
Monocytes Absolute: 0.5 10*3/uL (ref 0.1–1.0)
Monocytes Relative: 7 % (ref 3–12)
Neutro Abs: 2.4 10*3/uL (ref 1.7–7.7)
RDW: 11.9 % (ref 11.5–15.5)

## 2012-03-17 LAB — POCT I-STAT, CHEM 8
Calcium, Ion: 1.19 mmol/L (ref 1.12–1.23)
Creatinine, Ser: 0.9 mg/dL (ref 0.50–1.10)
Glucose, Bld: 96 mg/dL (ref 70–99)
HCT: 37 % (ref 36.0–46.0)
Hemoglobin: 12.6 g/dL (ref 12.0–15.0)
Potassium: 4.1 mEq/L (ref 3.5–5.1)
TCO2: 22 mmol/L (ref 0–100)

## 2012-03-17 NOTE — ED Notes (Signed)
Pt. Informed several times that a urine sample was needed to further her tx. Pt stated "this is taking too long" and wished to leave. Pt asked about blood work and RN reviewed the results with her, but she still wanted to leave. Pt asked to sign elopement form for leaving against medical advice. Pt. Refused and walked out. Pt ambulatory; a&ox4.

## 2012-03-17 NOTE — ED Notes (Signed)
Pt is reporting abdominal pain epigastric area.

## 2012-03-23 ENCOUNTER — Emergency Department (HOSPITAL_COMMUNITY)
Admission: EM | Admit: 2012-03-23 | Discharge: 2012-03-23 | Discharge status: Against Medical Advice | Payer: Medicaid Other | Attending: Emergency Medicine | Admitting: Emergency Medicine

## 2012-03-23 ENCOUNTER — Encounter (HOSPITAL_COMMUNITY): Payer: Self-pay | Admitting: Emergency Medicine

## 2012-03-23 DIAGNOSIS — Z886 Allergy status to analgesic agent status: Secondary | ICD-10-CM | POA: Insufficient documentation

## 2012-03-23 DIAGNOSIS — Z91013 Allergy to seafood: Secondary | ICD-10-CM | POA: Insufficient documentation

## 2012-03-23 DIAGNOSIS — Z79899 Other long term (current) drug therapy: Secondary | ICD-10-CM | POA: Insufficient documentation

## 2012-03-23 DIAGNOSIS — R109 Unspecified abdominal pain: Secondary | ICD-10-CM

## 2012-03-23 DIAGNOSIS — R112 Nausea with vomiting, unspecified: Secondary | ICD-10-CM | POA: Insufficient documentation

## 2012-03-23 DIAGNOSIS — Z881 Allergy status to other antibiotic agents status: Secondary | ICD-10-CM | POA: Insufficient documentation

## 2012-03-23 DIAGNOSIS — R197 Diarrhea, unspecified: Secondary | ICD-10-CM

## 2012-03-23 DIAGNOSIS — Z885 Allergy status to narcotic agent status: Secondary | ICD-10-CM | POA: Insufficient documentation

## 2012-03-23 DIAGNOSIS — F17200 Nicotine dependence, unspecified, uncomplicated: Secondary | ICD-10-CM | POA: Insufficient documentation

## 2012-03-23 MED ORDER — PROMETHAZINE HCL 25 MG RE SUPP: 25.0000 mg | Freq: Four times a day (QID) | RECTAL | Status: DC | PRN

## 2012-03-23 NOTE — ED Notes (Signed)
RN went into pt's room; pt not present.

## 2012-03-23 NOTE — ED Notes (Signed)
Pt reports abd pain, n/v/d X 1 week, and having headaches with pain

## 2012-03-23 NOTE — ED Provider Notes (Signed)
History     CSN: [id number]  Arrival date & time 03/23/12  4696   First MD Initiated Contact with Patient 03/23/12 0117      Chief Complaint  Patient presents with  . Abdominal Pain    (Consider location/radiation/quality/duration/timing/severity/associated sxs/prior treatment) HPI Comments: 31 year old female with chronic abdominal pain, chronic pancreatitis who presents with nausea vomiting and soft and loose stools for one week. She denies blood in her stools, she denies back pain and states that she has been using her OxyContin and Phenergan with no relief. She is more concerned with nausea and vomiting today than she is with her abdominal pain.  Patient is a 31 y.o. female presenting with abdominal pain. The history is provided by the patient and medical records.  Abdominal Pain The primary symptoms of the illness include abdominal pain.    Past Medical History  Diagnosis Date  . Pancreatitis   . S/P laparoscopic cholecystectomy   . Fibroids   . Ovarian cyst   . Chronic abdominal pain   . Anemia     Past Surgical History  Procedure Date  . Cholecystectomy   . Abdominal hysterectomy   . Dilation and curettage of uterus     Family History  Problem Relation Age of Onset  . Diabetes Mother   . Hypertension Mother   . Hypertension Father   . Diabetes Father   . Asthma Daughter     History  Substance Use Topics  . Smoking status: Current Every Day Smoker -- 0.5 packs/day for 10 years    Types: Cigarettes  . Smokeless tobacco: Never Used   Comment: declined  . Alcohol Use: No    OB History    Grav Para Term Preterm Abortions TAB SAB Ect Mult Living   4 2 1 1 2  2          Review of Systems  Gastrointestinal: Positive for abdominal pain.  All other systems reviewed and are negative.    Allergies  Celecoxib; Ketorolac tromethamine; Meperidine hcl; Coconut flavor; Zofran; Fentanyl; Fish allergy; Ibuprofen; Morphine; Propoxyphene-acetaminophen; and  Tramadol hcl  Home Medications   Current Outpatient Rx  Name Route Sig Dispense Refill  . DIPHENHYDRAMINE HCL 50 MG PO CAPS Oral Take 50 mg by mouth every 4 (four) hours as needed. For itching    . ESTRADIOL 2 MG PO TABS Oral Take 2 mg by mouth at bedtime.    Marland Kitchen METOCLOPRAMIDE HCL 10 MG PO TABS Oral Take 10 mg by mouth 4 (four) times daily as needed. nausea    . PROMETHAZINE HCL 25 MG RE SUPP Rectal Place 1 suppository (25 mg total) rectally every 6 (six) hours as needed for nausea. 12 each 0    BP 118/79  Pulse 95  Temp 97.8 F (36.6 C) (Oral)  Resp 18  SpO2 99%  LMP 02/04/2012  Physical Exam  Nursing note and vitals reviewed. Constitutional: She appears well-developed and well-nourished. No distress.  HENT:  Head: Normocephalic and atraumatic.  Mouth/Throat: Oropharynx is clear and moist. No oropharyngeal exudate.  Eyes: Conjunctivae normal and EOM are normal. Pupils are equal, round, and reactive to light. Right eye exhibits no discharge. Left eye exhibits no discharge. No scleral icterus.  Neck: Normal range of motion. Neck supple. No JVD present. No thyromegaly present.  Cardiovascular: Normal rate, regular rhythm, normal heart sounds and intact distal pulses.  Exam reveals no gallop and no friction rub.   No murmur heard. Pulmonary/Chest: Effort normal and breath sounds normal.  No respiratory distress. She has no wheezes. She has no rales.  Abdominal: Soft. Bowel sounds are normal. She exhibits no distension and no mass. There is no tenderness.       The patient has no obvious abdominal tenderness on exam, she has no masses, she does not guard, she has no peritoneal signs.  Musculoskeletal: Normal range of motion. She exhibits no edema and no tenderness.  Lymphadenopathy:    She has no cervical adenopathy.  Neurological: She is alert. Coordination normal.  Skin: Skin is warm and dry. No rash noted. No erythema.  Psychiatric: She has a normal mood and affect. Her behavior is  normal.    ED Course  Procedures (including critical care time)  Labs Reviewed - No data to display No results found.   1. Nausea vomiting and diarrhea   2. Chronic abdominal pain       MDM  The patient's vital signs are normal, she is not tachycardic, she has no abdominal tenderness on exam and has been offered intramuscular Phenergan and Reglan for her nausea. She also complains of right-sided headache that she gets when she vomits. At this time I have refused to give her intravenous opiate medications and she takes them at home and has drug-seeking behavior for her abdominal pain. She has refused intramuscular injections for nausea and states that she would rather followup with her family Dr. Donn Pierini patient will be discharged home with rectal Phenergan        Vida Roller, MD 03/23/12 0126

## 2012-03-23 NOTE — ED Notes (Signed)
PT left AMA 

## 2012-03-27 ENCOUNTER — Emergency Department (INDEPENDENT_AMBULATORY_CARE_PROVIDER_SITE_OTHER)
Admission: EM | Admit: 2012-03-27 | Discharge: 2012-03-27 | Disposition: A | Discharge status: Other Acute Inpatient Hospital | Payer: Medicaid Other | Source: Home / Self Care | Attending: Family Medicine | Admitting: Family Medicine

## 2012-03-27 ENCOUNTER — Encounter (HOSPITAL_COMMUNITY): Payer: Self-pay | Admitting: Emergency Medicine

## 2012-03-27 ENCOUNTER — Encounter (HOSPITAL_COMMUNITY): Payer: Self-pay

## 2012-03-27 DIAGNOSIS — Z90710 Acquired absence of both cervix and uterus: Secondary | ICD-10-CM | POA: Insufficient documentation

## 2012-03-27 DIAGNOSIS — K861 Other chronic pancreatitis: Secondary | ICD-10-CM | POA: Insufficient documentation

## 2012-03-27 DIAGNOSIS — Z9089 Acquired absence of other organs: Secondary | ICD-10-CM | POA: Insufficient documentation

## 2012-03-27 DIAGNOSIS — F17200 Nicotine dependence, unspecified, uncomplicated: Secondary | ICD-10-CM | POA: Insufficient documentation

## 2012-03-27 DIAGNOSIS — Z91013 Allergy to seafood: Secondary | ICD-10-CM | POA: Insufficient documentation

## 2012-03-27 DIAGNOSIS — Z886 Allergy status to analgesic agent status: Secondary | ICD-10-CM | POA: Insufficient documentation

## 2012-03-27 DIAGNOSIS — K859 Acute pancreatitis without necrosis or infection, unspecified: Secondary | ICD-10-CM

## 2012-03-27 DIAGNOSIS — Z79899 Other long term (current) drug therapy: Secondary | ICD-10-CM | POA: Insufficient documentation

## 2012-03-27 LAB — POCT PREGNANCY, URINE: Preg Test, Ur: NEGATIVE

## 2012-03-27 LAB — POCT URINALYSIS DIP (DEVICE)
Bilirubin Urine: NEGATIVE
Glucose, UA: NEGATIVE mg/dL
Ketones, ur: NEGATIVE mg/dL
Leukocytes, UA: NEGATIVE
Nitrite: NEGATIVE
Protein, ur: NEGATIVE mg/dL
Specific Gravity, Urine: 1.025 (ref 1.005–1.030)
Urobilinogen, UA: 0.2 mg/dL (ref 0.0–1.0)
pH: 6 (ref 5.0–8.0)

## 2012-03-27 NOTE — ED Notes (Signed)
patient states he has pancreatitis , and has been having a flare  Up since 9-15 w the pain being "10" of 1-10 scale , sometimes worse; NAD, w.d.color good, NAD at present

## 2012-03-27 NOTE — ED Provider Notes (Signed)
History     CSN: [id number]  Arrival date & time 03/27/12  1924   First MD Initiated Contact with Patient 03/27/12 1939      Chief Complaint  Patient presents with  . Abdominal Pain    (Consider location/radiation/quality/duration/timing/severity/associated sxs/prior treatment) Patient is a 31 y.o. female presenting with abdominal pain. The history is provided by the patient.  Abdominal Pain The primary symptoms of the illness include abdominal pain, nausea, vomiting and diarrhea. The primary symptoms of the illness do not include hematemesis or hematochezia. Primary symptoms comment: flare up end of aug at [facility]--amylase 404, couldn't get transferred to [facility], took meds and improved until 9/15, worse since. The current episode started more than 2 days ago. The onset of the illness was gradual. The problem has been gradually worsening.  The patient has had a change in bowel habit. Associated medical issues comments: s/p lap chole, adhesions,.    Past Medical History  Diagnosis Date  . Pancreatitis   . S/P laparoscopic cholecystectomy   . Fibroids   . Ovarian cyst   . Chronic abdominal pain   . Anemia     Past Surgical History  Procedure Date  . Cholecystectomy   . Abdominal hysterectomy   . Dilation and curettage of uterus     Family History  Problem Relation Age of Onset  . Diabetes Mother   . Hypertension Mother   . Hypertension Father   . Diabetes Father   . Asthma Daughter     History  Substance Use Topics  . Smoking status: Current Every Day Smoker -- 0.5 packs/day for 10 years    Types: Cigarettes  . Smokeless tobacco: Never Used   Comment: declined  . Alcohol Use: No    OB History    Grav Para Term Preterm Abortions TAB SAB Ect Mult Living   4 2 1 1 2  2          Review of Systems  Constitutional: Positive for appetite change.  Gastrointestinal: Positive for nausea, vomiting, abdominal pain, diarrhea and abdominal distention. Negative for blood  in stool, hematochezia and hematemesis.    Allergies  Celecoxib; Ketorolac tromethamine; Meperidine hcl; Coconut flavor; Zofran; Fentanyl; Fish allergy; Ibuprofen; Morphine; Propoxyphene-acetaminophen; and Tramadol hcl  Home Medications   Current Outpatient Rx  Name Route Sig Dispense Refill  . DIPHENHYDRAMINE HCL 50 MG PO CAPS Oral Take 50 mg by mouth every 4 (four) hours as needed. For itching    . ESTRADIOL 2 MG PO TABS Oral Take 2 mg by mouth at bedtime.    Marland Kitchen METOCLOPRAMIDE HCL 10 MG PO TABS Oral Take 10 mg by mouth 4 (four) times daily as needed. nausea    . PROMETHAZINE HCL 25 MG RE SUPP Rectal Place 1 suppository (25 mg total) rectally every 6 (six) hours as needed for nausea. 12 each 0    BP 116/65  Pulse 76  Temp 98.9 F (37.2 C) (Oral)  Resp 18  SpO2 100%  LMP 02/04/2012  Physical Exam  Nursing note and vitals reviewed. Constitutional: She is oriented to person, place, and time. She appears well-developed and well-nourished.  HENT:  Head: Normocephalic.  Pulmonary/Chest: Breath sounds normal.  Abdominal: Soft. She exhibits distension. She exhibits no mass. There is tenderness. There is rebound and guarding.  Neurological: She is alert and oriented to person, place, and time.    ED Course  Procedures (including critical care time)  Labs Reviewed  POCT URINALYSIS DIP (DEVICE) - Abnormal; Notable for  the following:    Hgb urine dipstick TRACE (*)     All other components within normal limits  POCT PREGNANCY, URINE   No results found.   1. Recurrent acute pancreatitis       MDM          Linna Hoff, MD 03/27/12 2138

## 2012-03-27 NOTE — ED Notes (Signed)
PT. SENT FROM [facility] REPORTS PERSISTENT CHRONIC GENERALIZED ABDOMINAL PAIN WITH NAUSEA AND VOMITTING FOR SEVERAL YEARS. URINE PREG TEST AND UA DONE AT URGENT CARE.

## 2012-03-28 ENCOUNTER — Emergency Department (HOSPITAL_COMMUNITY)
Admission: EM | Admit: 2012-03-28 | Discharge: 2012-03-28 | Disposition: A | Discharge status: Home / Self Care | Payer: Medicaid Other | Attending: Emergency Medicine | Admitting: Emergency Medicine

## 2012-03-28 DIAGNOSIS — K861 Other chronic pancreatitis: Secondary | ICD-10-CM

## 2012-03-28 MED ORDER — METOCLOPRAMIDE HCL 10 MG PO TABS: 10.0000 mg | ORAL_TABLET | Freq: Four times a day (QID) | ORAL | Status: DC

## 2012-03-28 MED ORDER — METOCLOPRAMIDE HCL 5 MG/ML IJ SOLN
10.0000 mg | Freq: Once | INTRAMUSCULAR | Status: DC
  Filled 2012-03-28: qty 2

## 2012-03-28 MED ORDER — PROMETHAZINE HCL 25 MG RE SUPP: 25.0000 mg | Freq: Four times a day (QID) | RECTAL | Status: DC | PRN

## 2012-03-28 NOTE — ED Notes (Signed)
The patient is AOx4 and comfortable with her discharge instructions. 

## 2012-03-28 NOTE — ED Notes (Signed)
The patient refused to put on her gown.

## 2012-03-30 NOTE — ED Provider Notes (Signed)
History     CSN: [id number]  Arrival date & time 03/27/12  2149   First MD Initiated Contact with Patient 03/28/12 0030      Chief Complaint  Patient presents with  . Abdominal Pain    (Consider location/radiation/quality/duration/timing/severity/associated sxs/prior treatment) HPI HX per PT, presents complaining of ABD pain and unable to get pain meds from her PCP since someone forged her name and ilegally filled a prescription for 90 narcotic pain pills. She has sharp stabbing persistent pain with h/o chronic pancreatitis. She went to the UC and was sent here for further evaluation.  PT is well known to me and has no new complaints. No N/V/D but states she cant hold anything down due to her pain. No F/C. She has this pain every day and pretty much all of the time.  Past Medical History  Diagnosis Date  . Pancreatitis   . S/P laparoscopic cholecystectomy   . Fibroids   . Ovarian cyst   . Chronic abdominal pain   . Anemia     Past Surgical History  Procedure Date  . Cholecystectomy   . Abdominal hysterectomy   . Dilation and curettage of uterus     Family History  Problem Relation Age of Onset  . Diabetes Mother   . Hypertension Mother   . Hypertension Father   . Diabetes Father   . Asthma Daughter     History  Substance Use Topics  . Smoking status: Current Every Day Smoker -- 0.5 packs/day for 10 years    Types: Cigarettes  . Smokeless tobacco: Never Used   Comment: declined  . Alcohol Use: No    OB History    Grav Para Term Preterm Abortions TAB SAB Ect Mult Living   4 2 1 1 2  2          Review of Systems  Constitutional: Negative for fever and chills.  HENT: Negative for neck pain and neck stiffness.   Eyes: Negative for pain.  Respiratory: Negative for shortness of breath.   Cardiovascular: Negative for chest pain.  Gastrointestinal: Positive for abdominal pain.  Genitourinary: Negative for dysuria.  Musculoskeletal: Negative for back pain.    Skin: Negative for rash.  Neurological: Negative for headaches.  All other systems reviewed and are negative.    Allergies  Celecoxib; Ketorolac tromethamine; Meperidine hcl; Coconut flavor; Zofran; Fentanyl; Fish allergy; Ibuprofen; Morphine; Propoxyphene-acetaminophen; and Tramadol hcl  Home Medications   Current Outpatient Rx  Name Route Sig Dispense Refill  . DIPHENHYDRAMINE HCL 50 MG PO CAPS Oral Take 50 mg by mouth every 4 (four) hours as needed. For itching    . ESTRADIOL 2 MG PO TABS Oral Take 2 mg by mouth at bedtime.    Marland Kitchen METOCLOPRAMIDE HCL 10 MG PO TABS Oral Take 10 mg by mouth 4 (four) times daily as needed. For nausea    . PROMETHAZINE HCL 25 MG RE SUPP Rectal Place 1 suppository (25 mg total) rectally every 6 (six) hours as needed for nausea. 12 each 0  . METOCLOPRAMIDE HCL 10 MG PO TABS Oral Take 1 tablet (10 mg total) by mouth every 6 (six) hours. 30 tablet 0  . PROMETHAZINE HCL 25 MG RE SUPP Rectal Place 1 suppository (25 mg total) rectally every 6 (six) hours as needed for nausea. 12 each 0    BP 126/87  Pulse 71  Temp 98.7 F (37.1 C) (Oral)  Resp 16  SpO2 97%  LMP 02/04/2012  Physical Exam  Nursing note and vitals reviewed. Constitutional: She is oriented to person, place, and time. She appears well-developed and well-nourished.  HENT:  Head: Normocephalic and atraumatic.  Eyes: EOM are normal. Pupils are equal, round, and reactive to light.  Neck: Neck supple.  Cardiovascular: Normal heart sounds and intact distal pulses.   Pulmonary/Chest: Effort normal. No respiratory distress.  Abdominal: Soft. Bowel sounds are normal. She exhibits no distension and no mass. There is no tenderness. There is no rebound and no guarding.       No tenderness with deep palpation throughout  Musculoskeletal: Normal range of motion. She exhibits no edema.  Neurological: She is alert and oriented to person, place, and time.  Skin: Skin is warm and dry.    ED Course   Procedures (including critical care time)  IM reglan and PO fluid challenge   Tolerates fluids. Records from [facility] reviewed - no UTI and UPT negative.   1. Chronic pancreatitis    No indication for [place], CT scan or labs at this time.  Pt is well known to me and this facility.  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  She will be referred back to her PCP and I encouraged her to seek chronic pain management. She states understanding recommendations and strict return precautions.    MDM    Adult female with chronic pain, tolerates PO fluids after IM reglan provided. She has prescriptions for antiemetics at home. No narcotics provided.         Sunnie Nielsen, MD 03/30/12 [phone]

## 2012-04-18 ENCOUNTER — Emergency Department (HOSPITAL_COMMUNITY)
Admission: EM | Admit: 2012-04-18 | Discharge: 2012-04-18 | Disposition: A | Discharge status: Home / Self Care | Payer: Medicaid Other | Attending: Emergency Medicine | Admitting: Emergency Medicine

## 2012-04-18 ENCOUNTER — Encounter (HOSPITAL_COMMUNITY): Payer: Self-pay | Admitting: *Deleted

## 2012-04-18 DIAGNOSIS — R059 Cough, unspecified: Secondary | ICD-10-CM | POA: Insufficient documentation

## 2012-04-18 DIAGNOSIS — R05 Cough: Secondary | ICD-10-CM | POA: Insufficient documentation

## 2012-04-18 DIAGNOSIS — R109 Unspecified abdominal pain: Secondary | ICD-10-CM | POA: Insufficient documentation

## 2012-04-18 DIAGNOSIS — J040 Acute laryngitis: Secondary | ICD-10-CM | POA: Insufficient documentation

## 2012-04-18 DIAGNOSIS — F17200 Nicotine dependence, unspecified, uncomplicated: Secondary | ICD-10-CM | POA: Insufficient documentation

## 2012-04-18 NOTE — ED Notes (Signed)
Pt states that abdominal pain since the 8th increase in pain. Pt states kids recently had a cold as well.

## 2012-04-18 NOTE — ED Notes (Signed)
Pt is here c/o her chronic abd pain r/t pancreatitis as well as a cough x 1.5 weeks.

## 2012-04-18 NOTE — ED Notes (Signed)
She has decided to leave and see her doctor in the am..  Her boyfriend is in fast track and she has gone back to stay with him

## 2012-04-19 ENCOUNTER — Emergency Department (HOSPITAL_COMMUNITY)
Admission: EM | Admit: 2012-04-19 | Discharge: 2012-04-19 | Disposition: A | Discharge status: Home / Self Care | Payer: Medicaid Other | Attending: Emergency Medicine | Admitting: Emergency Medicine

## 2012-04-19 DIAGNOSIS — J040 Acute laryngitis: Secondary | ICD-10-CM

## 2012-04-19 MED ORDER — BENZONATATE 100 MG PO CAPS
200.0000 mg | ORAL_CAPSULE | Freq: Once | ORAL | Status: AC
  Administered 2012-04-19: 200 mg via ORAL
  Filled 2012-04-19 (×2): qty 1

## 2012-04-19 MED ORDER — BENZONATATE 100 MG PO CAPS: 100.0000 mg | ORAL_CAPSULE | Freq: Three times a day (TID) | ORAL | Status: DC

## 2012-04-19 MED ORDER — PREDNISONE 20 MG PO TABS: 40.0000 mg | ORAL_TABLET | Freq: Every day | ORAL | Status: DC

## 2012-04-19 MED ORDER — PREDNISONE 20 MG PO TABS
40.0000 mg | ORAL_TABLET | Freq: Once | ORAL | Status: AC
  Administered 2012-04-19: 40 mg via ORAL
  Filled 2012-04-19: qty 2

## 2012-04-19 NOTE — ED Notes (Signed)
Pt has cough--nonproductive and has a hoarse raspy voice. Pt reports that she and her sone have been sick for the last week

## 2012-04-19 NOTE — ED Notes (Signed)
Pt refused blood draw in traige

## 2012-04-19 NOTE — ED Provider Notes (Signed)
History     CSN: [id number]  Arrival date & time 04/18/12  2257   First MD Initiated Contact with Patient 04/19/12 0044      Chief Complaint  Patient presents with  . Abdominal Pain    (Consider location/radiation/quality/duration/timing/severity/associated sxs/prior treatment) HPI Comments: 31 year old female with a history of chronic abdominal pain who presents with a complaint of hoarse voice, sore throat, nonproductive cough. She states that this has been going on for 5 days, it coincides with her children's coughing and viral syndromes for which they were treated with medications and symptomatically. She denies fevers, swelling, rashes but does have some epigastric pain associated with the coughing. Nothing makes better or worse, no medications prior to arrival, symptoms are moderate.  Patient is a 31 y.o. female presenting with abdominal pain. The history is provided by the patient and medical records.  Abdominal Pain The primary symptoms of the illness include abdominal pain. The primary symptoms of the illness do not include fever, shortness of breath, nausea or vomiting.  Symptoms associated with the illness do not include chills.    Past Medical History  Diagnosis Date  . Pancreatitis   . S/P laparoscopic cholecystectomy   . Fibroids   . Ovarian cyst   . Chronic abdominal pain   . Anemia     Past Surgical History  Procedure Date  . Cholecystectomy   . Abdominal hysterectomy   . Dilation and curettage of uterus     Family History  Problem Relation Age of Onset  . Diabetes Mother   . Hypertension Mother   . Hypertension Father   . Diabetes Father   . Asthma Daughter     History  Substance Use Topics  . Smoking status: Current Every Day Smoker -- 0.5 packs/day for 10 years    Types: Cigarettes  . Smokeless tobacco: Never Used   Comment: declined  . Alcohol Use: No    OB History    Grav Para Term Preterm Abortions TAB SAB Ect Mult Living   4 2 1 1 2   2          Review of Systems  Constitutional: Negative for fever and chills.  HENT: Positive for sore throat and voice change. Negative for ear pain and congestion.   Respiratory: Positive for cough. Negative for shortness of breath.   Cardiovascular: Negative for chest pain.  Gastrointestinal: Positive for abdominal pain. Negative for nausea and vomiting.  Skin: Negative for rash.    Allergies  Celecoxib; Ketorolac tromethamine; Meperidine hcl; Coconut flavor; Zofran; Fentanyl; Fish allergy; Ibuprofen; Morphine; Promethazine; Propoxyphene-acetaminophen; Reglan; and Tramadol hcl  Home Medications   Current Outpatient Rx  Name Route Sig Dispense Refill  . DIPHENHYDRAMINE HCL 50 MG PO CAPS Oral Take 50 mg by mouth every 4 (four) hours as needed. For itching    . ESTRADIOL 2 MG PO TABS Oral Take 2 mg by mouth at bedtime.    Marland Kitchen BENZONATATE 100 MG PO CAPS Oral Take 1 capsule (100 mg total) by mouth every 8 (eight) hours. 21 capsule 0  . PREDNISONE 20 MG PO TABS Oral Take 2 tablets (40 mg total) by mouth daily. 10 tablet 0    BP 125/89  Pulse 81  Temp 98 F (36.7 C) (Oral)  Resp 18  SpO2 99%  LMP 02/04/2012  Physical Exam  Nursing note and vitals reviewed. Constitutional: She appears well-developed and well-nourished. No distress.  HENT:  Head: Normocephalic and atraumatic.  Mouth/Throat: Oropharynx is clear and moist.  No oropharyngeal exudate.       Voice is hoarse, oropharynx is clear and moist without asymmetry exudate hypertrophy or erythema.  Eyes: Conjunctivae normal and EOM are normal. Pupils are equal, round, and reactive to light. Right eye exhibits no discharge. Left eye exhibits no discharge. No scleral icterus.  Neck: Normal range of motion. Neck supple. No JVD present. No thyromegaly present.  Cardiovascular: Normal rate, regular rhythm, normal heart sounds and intact distal pulses.  Exam reveals no gallop and no friction rub.   No murmur heard. Pulmonary/Chest:  Effort normal and breath sounds normal. No respiratory distress. She has no wheezes. She has no rales.  Abdominal: Soft. Bowel sounds are normal. She exhibits no distension and no mass. There is no tenderness.       The abdomen is soft and apparently nontender, the patient does not guard or Wednesday pain with palpation of the epigastrium or upper abdomen. No pain in the lower abdomen to palpation  Musculoskeletal: Normal range of motion. She exhibits no edema and no tenderness.  Lymphadenopathy:    She has no cervical adenopathy.  Neurological: She is alert. Coordination normal.  Skin: Skin is warm and dry. No rash noted. No erythema.  Psychiatric: She has a normal mood and affect. Her behavior is normal.    ED Course  Procedures (including critical care time)  Labs Reviewed - No data to display No results found.   1. Laryngitis       MDM  The patient likely has an upper respiratory infection such as laryngitis, she has clear lungs and oxygenating at 98% without fever or tachycardia on my exam. She appears stable for discharge with symptomatic medications, will treat with steroids and antitussives.  Discharge Prescriptions include:  Tessalon Prednisone         Vida Roller, MD 04/19/12 [phone]

## 2012-04-25 ENCOUNTER — Emergency Department (HOSPITAL_COMMUNITY)
Admission: EM | Admit: 2012-04-25 | Discharge: 2012-04-25 | Disposition: A | Discharge status: Home / Self Care | Payer: Medicaid Other | Attending: Emergency Medicine | Admitting: Emergency Medicine

## 2012-04-25 DIAGNOSIS — K869 Disease of pancreas, unspecified: Secondary | ICD-10-CM | POA: Insufficient documentation

## 2012-04-25 DIAGNOSIS — Z862 Personal history of diseases of the blood and blood-forming organs and certain disorders involving the immune mechanism: Secondary | ICD-10-CM | POA: Insufficient documentation

## 2012-04-25 DIAGNOSIS — R109 Unspecified abdominal pain: Secondary | ICD-10-CM | POA: Insufficient documentation

## 2012-04-25 DIAGNOSIS — Z8742 Personal history of other diseases of the female genital tract: Secondary | ICD-10-CM | POA: Insufficient documentation

## 2012-04-25 DIAGNOSIS — F17200 Nicotine dependence, unspecified, uncomplicated: Secondary | ICD-10-CM | POA: Insufficient documentation

## 2012-04-25 DIAGNOSIS — G8929 Other chronic pain: Secondary | ICD-10-CM | POA: Insufficient documentation

## 2012-04-25 NOTE — ED Notes (Signed)
PA at bedside.

## 2012-04-25 NOTE — ED Notes (Signed)
To ED for eval of HA. States she has HA's when she has a 'pancreatitis flare-up'. States she has nausea, vomiting, and diarrhea. Skin w/d, resp e/u. No active vomiting in triage.

## 2012-04-25 NOTE — ED Provider Notes (Signed)
History     CSN: [id number]  Arrival date & time 04/25/12  4098   First MD Initiated Contact with Patient 04/25/12 1022      Chief Complaint  Patient presents with  . Headache  . Abdominal Pain    (Consider location/radiation/quality/duration/timing/severity/associated sxs/prior treatment) HPI Comments: Patient is well-known to the emergency department with 46 previous visits in the past 6 months, history of pancreatitis, history of chronic abdominal pain -- presents with 2 and half week history of worsening abdominal pain, abdominal distention, frequent vomiting and diarrhea. Patient states that she has been unable to keep down her pain and nausea medications. Patient states that the symptoms are similar to previous chronic symptoms. She has not had any fever. She has not noted any blood in her vomit or stool. No history of hematuria or dysuria. Patient has a history of cholecystectomy, hysterectomy. No injuries. Patient states that she's not able to see her primary physician until the beginning of next month. Onset gradual. Course constant. Nothing makes symptoms better worse.   The history is provided by the patient and medical records.    Past Medical History  Diagnosis Date  . Pancreatitis   . S/P laparoscopic cholecystectomy   . Fibroids   . Ovarian cyst   . Chronic abdominal pain   . Anemia     Past Surgical History  Procedure Date  . Cholecystectomy   . Abdominal hysterectomy   . Dilation and curettage of uterus     Family History  Problem Relation Age of Onset  . Diabetes Mother   . Hypertension Mother   . Hypertension Father   . Diabetes Father   . Asthma Daughter     History  Substance Use Topics  . Smoking status: Current Every Day Smoker -- 0.5 packs/day for 10 years    Types: Cigarettes  . Smokeless tobacco: Never Used   Comment: declined  . Alcohol Use: No    OB History    Grav Para Term Preterm Abortions TAB SAB Ect Mult Living   4 2 1 1 2  2           Review of Systems  Constitutional: Negative for fever.  HENT: Negative for sore throat and rhinorrhea.   Eyes: Negative for redness.  Respiratory: Negative for cough and shortness of breath.   Cardiovascular: Negative for chest pain.  Gastrointestinal: Positive for nausea, vomiting, abdominal pain and diarrhea.  Genitourinary: Negative for dysuria.  Musculoskeletal: Negative for myalgias.  Skin: Negative for rash.  Neurological: Positive for headaches.    Allergies  Celecoxib; Ketorolac tromethamine; Meperidine hcl; Coconut flavor; Zofran; Fentanyl; Fish allergy; Ibuprofen; Morphine; Promethazine; Propoxyphene-acetaminophen; Reglan; and Tramadol hcl  Home Medications   Current Outpatient Rx  Name Route Sig Dispense Refill  . DIPHENHYDRAMINE HCL 50 MG PO CAPS Oral Take 50 mg by mouth every 4 (four) hours as needed. For itching    . ESTRADIOL 2 MG PO TABS Oral Take 2 mg by mouth at bedtime.      BP 126/78  Pulse 88  Temp 97.9 F (36.6 C) (Oral)  Resp 16  SpO2 100%  LMP 02/04/2012  Physical Exam  Nursing note and vitals reviewed. Constitutional: She appears well-developed and well-nourished.  HENT:  Head: Normocephalic and atraumatic.  Nose: Nose normal.  Mouth/Throat: Oropharynx is clear and moist.  Eyes: Conjunctivae normal are normal. Right eye exhibits no discharge. Left eye exhibits no discharge.       Mucous membranes moist.   Neck:  Normal range of motion. Neck supple.  Cardiovascular: Normal rate, regular rhythm and normal heart sounds.   Pulmonary/Chest: Effort normal and breath sounds normal.  Abdominal: Soft. Bowel sounds are normal. There is no tenderness. There is no rebound and no guarding.       No appreciable abdominal tenderness on exam.   Musculoskeletal: She exhibits no edema and no tenderness.  Neurological: She is alert.  Skin: Skin is warm and dry.  Psychiatric: She has a normal mood and affect.    ED Course  Procedures (including  critical care time)  Labs Reviewed - No data to display No results found.   1. Chronic abdominal pain     10:50 AM Patient seen and examined.    Vital signs reviewed and are as follows: Filed Vitals:   04/25/12 0943  BP: 126/78  Pulse: 88  Temp: 97.9 F (36.6 C)  Resp: 16   Patient offered lab check and IM nausea medication. Patient simply requests GI referral and follow-up as outpatient. She requests discharge. Given unconcerning exam and history of similar symptoms, will allow patient to leave. Urged return if she changes her mind or if symptoms   The patient was urged to return to the Emergency Department immediately with worsening of current symptoms, worsening abdominal pain, persistent vomiting, blood noted in stools, fever, or any other concerns. The patient verbalized understanding.     MDM  Patient with chronic abdominal pain, complain of frequent nausea, vomiting, and diarrhea. Patient states symptoms are similar to previous. Exam is benign. Patient does not appear dry or dehydrated. She has not vomited or had diarrhea during ED stay. She has no appreciable abdominal tenderness on exam. Her vital signs are normal. I have offered patient check of basic labs, nausea control however she states that she wants to leave and is requesting a gastroenterology referral, which I have provided. Return instructions given.        Renne Crigler, [place] 04/25/12 1102

## 2012-04-26 NOTE — ED Provider Notes (Signed)
Medical screening examination/treatment/procedure(s) were performed by non-physician practitioner and as supervising physician I was immediately available for consultation/collaboration.  Tobin Chad, MD 04/26/12 [phone]

## 2012-04-28 ENCOUNTER — Telehealth (INDEPENDENT_AMBULATORY_CARE_PROVIDER_SITE_OTHER): Payer: Self-pay | Admitting: General Surgery

## 2012-04-28 NOTE — Telephone Encounter (Signed)
[name] called  Re ER visit at [facility] yesterday. She was experiencing abdominal pain, N/V. She indicated she had a scan done. She was told she may have adhesions and to f/u with her surgeon. I requested CT report from [facility] and reviewed it with Dr. Carolynne Edouard. No acute process indicated. SHe has had previous surgery by Dr. Carolynne Edouard and no adhesions were found. Dr. Carolynne Edouard recommended she see another Dr. In our practice or be referred to [facility]. Would you please clarify this with him. I did speak to Nayda and told here the plan/gy

## 2012-05-01 ENCOUNTER — Encounter (INDEPENDENT_AMBULATORY_CARE_PROVIDER_SITE_OTHER): Payer: Self-pay | Admitting: General Surgery

## 2012-05-05 ENCOUNTER — Emergency Department (HOSPITAL_COMMUNITY)
Admission: EM | Admit: 2012-05-05 | Discharge: 2012-05-05 | Disposition: A | Discharge status: Home / Self Care | Payer: Medicaid Other | Attending: Emergency Medicine | Admitting: Emergency Medicine

## 2012-05-05 ENCOUNTER — Encounter (HOSPITAL_COMMUNITY): Payer: Self-pay | Admitting: Emergency Medicine

## 2012-05-05 DIAGNOSIS — K047 Periapical abscess without sinus: Secondary | ICD-10-CM

## 2012-05-05 DIAGNOSIS — K044 Acute apical periodontitis of pulpal origin: Secondary | ICD-10-CM | POA: Insufficient documentation

## 2012-05-05 DIAGNOSIS — Z8742 Personal history of other diseases of the female genital tract: Secondary | ICD-10-CM | POA: Insufficient documentation

## 2012-05-05 DIAGNOSIS — Z8719 Personal history of other diseases of the digestive system: Secondary | ICD-10-CM | POA: Insufficient documentation

## 2012-05-05 DIAGNOSIS — F17200 Nicotine dependence, unspecified, uncomplicated: Secondary | ICD-10-CM | POA: Insufficient documentation

## 2012-05-05 DIAGNOSIS — G8918 Other acute postprocedural pain: Secondary | ICD-10-CM | POA: Insufficient documentation

## 2012-05-05 DIAGNOSIS — Z862 Personal history of diseases of the blood and blood-forming organs and certain disorders involving the immune mechanism: Secondary | ICD-10-CM | POA: Insufficient documentation

## 2012-05-05 DIAGNOSIS — Z9089 Acquired absence of other organs: Secondary | ICD-10-CM | POA: Insufficient documentation

## 2012-05-05 MED ORDER — OXYCODONE-ACETAMINOPHEN 5-325 MG PO TABS: 1.0000 | ORAL_TABLET | ORAL | Status: DC | PRN

## 2012-05-05 MED ORDER — PENICILLIN V POTASSIUM 500 MG PO TABS: 500.0000 mg | ORAL_TABLET | Freq: Three times a day (TID) | ORAL | Status: DC

## 2012-05-05 MED ORDER — LIDOCAINE VISCOUS 2 % MT SOLN: 20.0000 mL | OROMUCOSAL | Status: DC | PRN

## 2012-05-05 MED ORDER — OXYCODONE-ACETAMINOPHEN 5-325 MG PO TABS
1.0000 | ORAL_TABLET | Freq: Once | ORAL | Status: AC
  Administered 2012-05-05: 1 via ORAL
  Filled 2012-05-05: qty 1

## 2012-05-05 NOTE — ED Provider Notes (Signed)
History     CSN: [id number]  Arrival date & time 05/05/12  2219   First MD Initiated Contact with Patient 05/05/12 2333      Chief Complaint  Patient presents with  . Dental Pain    (Consider location/radiation/quality/duration/timing/severity/associated sxs/prior treatment) HPI History provided by pt.   Pt had a right lower tooth pulled 2 days ago.  Developed severe, sharp pain at extraction site, with associated edema and bad taste in mouth this morning.  No fever.  No relief w/ vicodin.  Contacted oral surgeon and office is not open until Monday.    Past Medical History  Diagnosis Date  . Pancreatitis   . S/P laparoscopic cholecystectomy   . Fibroids   . Ovarian cyst   . Chronic abdominal pain   . Anemia     Past Surgical History  Procedure Date  . Cholecystectomy   . Abdominal hysterectomy   . Dilation and curettage of uterus     Family History  Problem Relation Age of Onset  . Diabetes Mother   . Hypertension Mother   . Hypertension Father   . Diabetes Father   . Asthma Daughter     History  Substance Use Topics  . Smoking status: Current Every Day Smoker -- 0.5 packs/day for 10 years    Types: Cigarettes  . Smokeless tobacco: Never Used   Comment: declined  . Alcohol Use: No    OB History    Grav Para Term Preterm Abortions TAB SAB Ect Mult Living   4 2 1 1 2  2          Review of Systems  All other systems reviewed and are negative.    Allergies  Celecoxib; Ketorolac tromethamine; Meperidine hcl; Coconut flavor; Zofran; Fentanyl; Fish allergy; Ibuprofen; Morphine; Promethazine; Propoxyphene-acetaminophen; Reglan; and Tramadol hcl  Home Medications   Current Outpatient Rx  Name Route Sig Dispense Refill  . HYDROCODONE-ACETAMINOPHEN 5-325 MG PO TABS Oral Take 1 tablet by mouth every 4 (four) hours as needed. For pain    . OXYCODONE-ACETAMINOPHEN 5-325 MG PO TABS Oral Take 1 tablet by mouth every 4 (four) hours as needed for pain. 20 tablet 0   . PENICILLIN V POTASSIUM 500 MG PO TABS Oral Take 1 tablet (500 mg total) by mouth 3 (three) times daily. 30 tablet 0    BP 120/78  Pulse 87  Temp 98.8 F (37.1 C) (Oral)  Resp 20  SpO2 99%  LMP 02/04/2012  Physical Exam  Nursing note and vitals reviewed. Constitutional: She is oriented to person, place, and time. She appears well-developed and well-nourished.  HENT:  Head: Normocephalic and atraumatic. No trismus in the jaw.  Mouth/Throat: Uvula is midline and oropharynx is clear and moist.       Right lower 1st molar has been extracted.  Sutures in place.  No drainage from this site or visible foreign body.  Gingiva edematous.  Mild edema of adjacent buccal mucosa as well.    Eyes:       Normal appearance  Neck: Normal range of motion. Neck supple.       No submandibular edema  Lymphadenopathy:    She has no cervical adenopathy.  Neurological: She is alert and oriented to person, place, and time.  Psychiatric: She has a normal mood and affect. Her behavior is normal.    ED Course  Procedures (including critical care time)  Labs Reviewed - No data to display No results found.   1. Dental infection  MDM  Pt presents w/ post dental extraction pain and edema.  Doubt foreign body in extraction site because she has not eaten any solids and sutures are in place, but exam is suspicious for dental infection.  Prescribed penicillin and percocet and referred back to oral surgeon.  Return precautions discussed.         Otilio Miu, PA 05/05/12 2350

## 2012-05-05 NOTE — ED Notes (Signed)
Had R lower tooth pulled on Wednesday, feels like its swollen today and hurting badly

## 2012-05-06 NOTE — ED Provider Notes (Signed)
Medical screening examination/treatment/procedure(s) were performed by non-physician practitioner and as supervising physician I was immediately available for consultation/collaboration.   Hanley Seamen, MD 05/06/12 [phone]

## 2012-05-07 ENCOUNTER — Emergency Department (HOSPITAL_COMMUNITY)
Admission: EM | Admit: 2012-05-07 | Discharge: 2012-05-07 | Disposition: A | Discharge status: Home / Self Care | Payer: Medicaid Other | Attending: Emergency Medicine | Admitting: Emergency Medicine

## 2012-05-07 ENCOUNTER — Encounter (HOSPITAL_COMMUNITY): Payer: Self-pay | Admitting: Emergency Medicine

## 2012-05-07 DIAGNOSIS — Z9889 Other specified postprocedural states: Secondary | ICD-10-CM | POA: Insufficient documentation

## 2012-05-07 DIAGNOSIS — Z8719 Personal history of other diseases of the digestive system: Secondary | ICD-10-CM | POA: Insufficient documentation

## 2012-05-07 DIAGNOSIS — Z862 Personal history of diseases of the blood and blood-forming organs and certain disorders involving the immune mechanism: Secondary | ICD-10-CM | POA: Insufficient documentation

## 2012-05-07 DIAGNOSIS — R109 Unspecified abdominal pain: Secondary | ICD-10-CM | POA: Insufficient documentation

## 2012-05-07 DIAGNOSIS — K0889 Other specified disorders of teeth and supporting structures: Secondary | ICD-10-CM

## 2012-05-07 DIAGNOSIS — G8918 Other acute postprocedural pain: Secondary | ICD-10-CM | POA: Insufficient documentation

## 2012-05-07 DIAGNOSIS — F17200 Nicotine dependence, unspecified, uncomplicated: Secondary | ICD-10-CM | POA: Insufficient documentation

## 2012-05-07 DIAGNOSIS — Z8742 Personal history of other diseases of the female genital tract: Secondary | ICD-10-CM | POA: Insufficient documentation

## 2012-05-07 DIAGNOSIS — K089 Disorder of teeth and supporting structures, unspecified: Secondary | ICD-10-CM | POA: Insufficient documentation

## 2012-05-07 MED ORDER — OXYCODONE-ACETAMINOPHEN 5-325 MG PO TABS: 1.0000 | ORAL_TABLET | Freq: Four times a day (QID) | ORAL | Status: DC | PRN

## 2012-05-07 MED ORDER — OXYCODONE-ACETAMINOPHEN 5-325 MG PO TABS
1.0000 | ORAL_TABLET | Freq: Once | ORAL | Status: AC
  Administered 2012-05-07: 1 via ORAL
  Filled 2012-05-07: qty 1

## 2012-05-07 NOTE — ED Provider Notes (Signed)
Medical screening examination/treatment/procedure(s) were performed by non-physician practitioner and as supervising physician I was immediately available for consultation/collaboration.  Aleasha Fregeau, MD 05/07/12 2331 

## 2012-05-07 NOTE — ED Notes (Signed)
Pt reports had R lower tooth removed on Wednesday, came here 2 days ago for same and was told she had an infection; reports still having pain and swelling

## 2012-05-07 NOTE — ED Provider Notes (Signed)
History     CSN: [id number]  Arrival date & time 05/07/12  2100   First MD Initiated Contact with Patient 05/07/12 2134      Chief Complaint  Patient presents with  . Dental Pain    (Consider location/radiation/quality/duration/timing/severity/associated sxs/prior treatment) HPI  31 year old female who recently had a right lower premolar dental extraction which will perform 5 days ago presents complaining of increasing pain to the surgical site. Sts she notices yellow discharge and increase swelling at her surgery site and the sutures that were placed were popped.  Onset is gradual, persistent, worsening with chewing, throbbing in nature, moderate in severity.  No fever, chills, or rash.  Was seen in ER for same 2 days ago and was given percocet and penicillin.  Sts she has ran out of her pain meds and have been continue taking abx.  Has tried calling Dr. Barbette Merino (oral surgeon) office but unable to f/u today.    Past Medical History  Diagnosis Date  . Pancreatitis   . S/P laparoscopic cholecystectomy   . Fibroids   . Ovarian cyst   . Chronic abdominal pain   . Anemia     Past Surgical History  Procedure Date  . Cholecystectomy   . Abdominal hysterectomy   . Dilation and curettage of uterus     Family History  Problem Relation Age of Onset  . Diabetes Mother   . Hypertension Mother   . Hypertension Father   . Diabetes Father   . Asthma Daughter     History  Substance Use Topics  . Smoking status: Current Every Day Smoker -- 0.5 packs/day for 10 years    Types: Cigarettes  . Smokeless tobacco: Never Used     Comment: declined  . Alcohol Use: No    OB History    Grav Para Term Preterm Abortions TAB SAB Ect Mult Living   4 2 1 1 2  2          Review of Systems  Constitutional: Negative for fever.  HENT: Positive for facial swelling and dental problem. Negative for sore throat, mouth sores, trouble swallowing, neck pain, neck stiffness and voice change.   Skin:  Negative for rash.    Allergies  Celecoxib; Ketorolac tromethamine; Meperidine hcl; Coconut flavor; Zofran; Fentanyl; Fish allergy; Ibuprofen; Morphine; Promethazine; Propoxyphene-acetaminophen; Reglan; and Tramadol hcl  Home Medications   Current Outpatient Rx  Name  Route  Sig  Dispense  Refill  . PENICILLIN V POTASSIUM 500 MG PO TABS   Oral   Take 1 tablet (500 mg total) by mouth 3 (three) times daily.   30 tablet   0     BP 149/94  Pulse 83  Temp 98.6 F (37 C) (Oral)  Resp 18  SpO2 99%  LMP 02/04/2012  Physical Exam  Nursing note and vitals reviewed. Constitutional: She appears well-developed and well-nourished. No distress.  HENT:  Head: Atraumatic.       Post surgical dental extraction site at R lower 2nd molar.  Ttp, no obvious infection, no fb.  No sutures noted.    No trismus  Eyes: Conjunctivae normal are normal.  Neck: Normal range of motion. Neck supple.  Lymphadenopathy:    She has no cervical adenopathy.  Neurological: She is alert.  Skin: Skin is warm. No rash noted.  Psychiatric: She has a normal mood and affect.    ED Course  Procedures (including critical care time)  Labs Reviewed - No data to display No results  found.   No diagnosis found.  1. Post surgical dental pain.  MDM  Pt returns due to being out of pain meds and continues to have pain at dental extraction site.  No evidence of deep tissue infection.  I recommend continue with abx, will give short course of pain meds.  Pt to f/u with Dr. Barbette Merino tomorrow for further care.  Pt voice understanding and agrees with plan.   BP 149/94  Pulse 83  Temp 98.6 F (37 C) (Oral)  Resp 18  SpO2 99%  LMP 02/04/2012         Fayrene Helper, PA-C 05/07/12 2203

## 2012-05-15 ENCOUNTER — Encounter: Payer: Self-pay | Admitting: Internal Medicine

## 2012-05-15 ENCOUNTER — Encounter (INDEPENDENT_AMBULATORY_CARE_PROVIDER_SITE_OTHER): Payer: Self-pay | Admitting: General Surgery

## 2012-05-16 ENCOUNTER — Emergency Department (HOSPITAL_COMMUNITY)
Admission: EM | Admit: 2012-05-16 | Discharge: 2012-05-16 | Disposition: A | Discharge status: Home / Self Care | Payer: Medicaid Other | Attending: Emergency Medicine | Admitting: Emergency Medicine

## 2012-05-16 ENCOUNTER — Encounter (HOSPITAL_COMMUNITY): Payer: Self-pay | Admitting: Emergency Medicine

## 2012-05-16 DIAGNOSIS — R51 Headache: Secondary | ICD-10-CM | POA: Insufficient documentation

## 2012-05-16 DIAGNOSIS — G8929 Other chronic pain: Secondary | ICD-10-CM | POA: Insufficient documentation

## 2012-05-16 DIAGNOSIS — F17200 Nicotine dependence, unspecified, uncomplicated: Secondary | ICD-10-CM | POA: Insufficient documentation

## 2012-05-16 DIAGNOSIS — K861 Other chronic pancreatitis: Secondary | ICD-10-CM | POA: Insufficient documentation

## 2012-05-16 DIAGNOSIS — D649 Anemia, unspecified: Secondary | ICD-10-CM | POA: Insufficient documentation

## 2012-05-16 DIAGNOSIS — R1110 Vomiting, unspecified: Secondary | ICD-10-CM | POA: Insufficient documentation

## 2012-05-16 DIAGNOSIS — Z8742 Personal history of other diseases of the female genital tract: Secondary | ICD-10-CM | POA: Insufficient documentation

## 2012-05-16 DIAGNOSIS — R197 Diarrhea, unspecified: Secondary | ICD-10-CM | POA: Insufficient documentation

## 2012-05-16 LAB — CBC WITH DIFFERENTIAL/PLATELET
Basophils Absolute: 0 10*3/uL (ref 0.0–0.1)
Basophils Relative: 1 % (ref 0–1)
Eosinophils Absolute: 0.4 10*3/uL (ref 0.0–0.7)
MCH: 27.1 pg (ref 26.0–34.0)
MCHC: 33.7 g/dL (ref 30.0–36.0)
Monocytes Relative: 11 % (ref 3–12)
Neutro Abs: 3.3 10*3/uL (ref 1.7–7.7)
Neutrophils Relative %: 48 % (ref 43–77)
Platelets: 274 10*3/uL (ref 150–400)
RDW: 12 % (ref 11.5–15.5)

## 2012-05-16 LAB — BASIC METABOLIC PANEL
BUN: 16 mg/dL (ref 6–23)
Chloride: 106 mEq/L (ref 96–112)
GFR calc Af Amer: >90 mL/min (ref >90)
GFR calc non Af Amer: >90 mL/min (ref >90)
Potassium: 3.8 mEq/L (ref 3.5–5.1)

## 2012-05-16 LAB — URINALYSIS, ROUTINE W REFLEX MICROSCOPIC
Bilirubin Urine: NEGATIVE
Hgb urine dipstick: NEGATIVE
Nitrite: NEGATIVE
Protein, ur: NEGATIVE mg/dL
Specific Gravity, Urine: 1.028 (ref 1.005–1.030)
Urobilinogen, UA: 1 mg/dL (ref 0.0–1.0)

## 2012-05-16 LAB — LIPASE, BLOOD: Lipase: 78 U/L — ABNORMAL HIGH (ref 11–59)

## 2012-05-16 MED ORDER — ONDANSETRON HCL 4 MG/2ML IJ SOLN
4.0000 mg | Freq: Once | INTRAMUSCULAR | Status: AC
  Administered 2012-05-16: 4 mg via INTRAVENOUS
  Filled 2012-05-16: qty 2

## 2012-05-16 MED ORDER — LORAZEPAM 2 MG/ML IJ SOLN: 0.5000 mg | Freq: Once | INTRAMUSCULAR | Status: DC

## 2012-05-16 MED ORDER — HYDROMORPHONE HCL PF 1 MG/ML IJ SOLN
1.0000 mg | Freq: Once | INTRAMUSCULAR | Status: AC
  Administered 2012-05-16: 1 mg via INTRAVENOUS
  Filled 2012-05-16: qty 1

## 2012-05-16 MED ORDER — HYDROXYZINE HCL 25 MG PO TABS
25.0000 mg | ORAL_TABLET | Freq: Once | ORAL | Status: DC
  Filled 2012-05-16: qty 1

## 2012-05-16 MED ORDER — DIPHENHYDRAMINE HCL 50 MG/ML IJ SOLN
25.0000 mg | Freq: Once | INTRAMUSCULAR | Status: AC
  Administered 2012-05-16: 25 mg via INTRAVENOUS
  Filled 2012-05-16: qty 1

## 2012-05-16 MED ORDER — SODIUM CHLORIDE 0.9 % IV BOLUS (SEPSIS)
1000.0000 mL | Freq: Once | INTRAVENOUS | Status: AC
  Administered 2012-05-16: 1000 mL via INTRAVENOUS

## 2012-05-16 MED ORDER — METOCLOPRAMIDE HCL 5 MG/ML IJ SOLN
10.0000 mg | Freq: Once | INTRAMUSCULAR | Status: AC
  Administered 2012-05-16: 10 mg via INTRAVENOUS
  Filled 2012-05-16: qty 2

## 2012-05-16 MED ORDER — OXYCODONE-ACETAMINOPHEN 5-325 MG PO TABS: 1.0000 | ORAL_TABLET | Freq: Four times a day (QID) | ORAL | Status: DC | PRN

## 2012-05-16 NOTE — ED Provider Notes (Signed)
Medical screening examination/treatment/procedure(s) were performed by non-physician practitioner and as supervising physician I was immediately available for consultation/collaboration.  Dawana Asper K Linker, MD 05/16/12 2240 

## 2012-05-16 NOTE — ED Notes (Signed)
Nurses asked to come back for vitals later.

## 2012-05-16 NOTE — ED Provider Notes (Signed)
History     CSN: [id number]  Arrival date & time 05/16/12  1347   First MD Initiated Contact with Patient 05/16/12 1808      Chief Complaint  Patient presents with  . Abdominal Pain  . Emesis  . Headache    (Consider location/radiation/quality/duration/timing/severity/associated sxs/prior treatment) HPI Comments: Patient with history of chronic pancreatitis and multiple ED visit & hospitalizations presents with complaint of abdominal pain. Patient states that the pain is epigastric, 7/10, without radiation or transmission. Associated symptoms include nausea and vomiting. Denies fever or chills. Denies hematemesis, melena, or hematochezia. Denies SOB.  The history is provided by the patient. No language interpreter was used.    Past Medical History  Diagnosis Date  . Pancreatitis   . S/P laparoscopic cholecystectomy   . Fibroids   . Ovarian cyst   . Chronic abdominal pain   . Anemia     Past Surgical History  Procedure Date  . Cholecystectomy   . Abdominal hysterectomy   . Dilation and curettage of uterus     Family History  Problem Relation Age of Onset  . Diabetes Mother   . Hypertension Mother   . Hypertension Father   . Diabetes Father   . Asthma Daughter     History  Substance Use Topics  . Smoking status: Current Every Day Smoker -- 0.5 packs/day for 10 years    Types: Cigarettes  . Smokeless tobacco: Never Used     Comment: declined  . Alcohol Use: No    OB History    Grav Para Term Preterm Abortions TAB SAB Ect Mult Living   4 2 1 1 2  2          Review of Systems  Constitutional: Negative for fever and chills.  Respiratory: Negative for shortness of breath.   Gastrointestinal: Positive for nausea, vomiting, abdominal pain and diarrhea. Negative for blood in stool.    Allergies  Celecoxib; Ketorolac tromethamine; Meperidine hcl; Coconut flavor; Zofran; Fentanyl; Fish allergy; Ibuprofen; Morphine; Promethazine; Propoxyphene-acetaminophen;  Reglan; and Tramadol hcl  Home Medications   Current Outpatient Rx  Name  Route  Sig  Dispense  Refill  . ACETAMINOPHEN 500 MG PO TABS   Oral   Take 500 mg by mouth every 6 (six) hours as needed. For pain         . PENICILLIN V POTASSIUM 500 MG PO TABS   Oral   Take 500 mg by mouth 3 (three) times daily. For 14 days starting on 05/06/12         . OXYCODONE-ACETAMINOPHEN 5-325 MG PO TABS   Oral   Take 1 tablet by mouth every 6 (six) hours as needed for pain.   6 tablet   0     BP 121/86  Pulse 92  Temp 98.5 F (36.9 C) (Oral)  Resp 15  SpO2 100%  LMP 02/04/2012  Physical Exam  Nursing note and vitals reviewed. Constitutional: She appears well-developed and well-nourished.  HENT:  Head: Normocephalic and atraumatic.  Mouth/Throat: Oropharynx is clear and moist.  Eyes: Conjunctivae normal and EOM are normal. No scleral icterus.  Neck: Normal range of motion. Neck supple.  Cardiovascular: Normal rate, regular rhythm and normal heart sounds.   Pulmonary/Chest: Effort normal and breath sounds normal.  Abdominal: Soft. Bowel sounds are normal. There is tenderness.       Tenderness to palpation of the epigastrium.   Neurological: She is alert.  Skin: Skin is warm and dry.  ED Course  Procedures (including critical care time)  Labs Reviewed  CBC WITH DIFFERENTIAL - Abnormal; Notable for the following:    Eosinophils Relative 6 (*)     All other components within normal limits  BASIC METABOLIC PANEL - Abnormal; Notable for the following:    Glucose, Bld 100 (*)     All other components within normal limits  LIPASE, BLOOD - Abnormal; Notable for the following:    Lipase 78 (*)     All other components within normal limits  URINALYSIS, ROUTINE W REFLEX MICROSCOPIC   Results for orders placed during the hospital encounter of 05/16/12  CBC WITH DIFFERENTIAL      Component Value Range   WBC 6.8  4.0 - 10.5 K/uL   RBC 4.68  3.87 - 5.11 MIL/uL   Hemoglobin 12.7   12.0 - 15.0 g/dL   HCT 09.8  11.9 - 14.7 %   MCV 80.6  78.0 - 100.0 fL   MCH 27.1  26.0 - 34.0 pg   MCHC 33.7  30.0 - 36.0 g/dL   RDW 82.9  56.2 - 13.0 %   Platelets 274  150 - 400 K/uL   Neutrophils Relative 48  43 - 77 %   Neutro Abs 3.3  1.7 - 7.7 K/uL   Lymphocytes Relative 35  12 - 46 %   Lymphs Abs 2.4  0.7 - 4.0 K/uL   Monocytes Relative 11  3 - 12 %   Monocytes Absolute 0.7  0.1 - 1.0 K/uL   Eosinophils Relative 6 (*) 0 - 5 %   Eosinophils Absolute 0.4  0.0 - 0.7 K/uL   Basophils Relative 1  0 - 1 %   Basophils Absolute 0.0  0.0 - 0.1 K/uL  BASIC METABOLIC PANEL      Component Value Range   Sodium 141  135 - 145 mEq/L   Potassium 3.8  3.5 - 5.1 mEq/L   Chloride 106  96 - 112 mEq/L   CO2 24  19 - 32 mEq/L   Glucose, Bld 100 (*) 70 - 99 mg/dL   BUN 16  6 - 23 mg/dL   Creatinine, Ser 8.65  0.50 - 1.10 mg/dL   Calcium 9.1  8.4 - 78.4 mg/dL   GFR calc non Af Amer >90  >90 mL/min   GFR calc Af Amer >90  >90 mL/min  LIPASE, BLOOD      Component Value Range   Lipase 78 (*) 11 - 59 U/L  URINALYSIS, ROUTINE W REFLEX MICROSCOPIC      Component Value Range   Color, Urine YELLOW  YELLOW   APPearance CLEAR  CLEAR   Specific Gravity, Urine 1.028  1.005 - 1.030   pH 6.5  5.0 - 8.0   Glucose, UA NEGATIVE  NEGATIVE mg/dL   Hgb urine dipstick NEGATIVE  NEGATIVE   Bilirubin Urine NEGATIVE  NEGATIVE   Ketones, ur NEGATIVE  NEGATIVE mg/dL   Protein, ur NEGATIVE  NEGATIVE mg/dL   Urobilinogen, UA 1.0  0.0 - 1.0 mg/dL   Nitrite NEGATIVE  NEGATIVE   Leukocytes, UA NEGATIVE  NEGATIVE    No results found.   1. Chronic pancreatitis       MDM  Patient presented for acute treatment of her chronic pancreatitis. Patient frequently visits the ER and there is a component of drug seeking behavior. Patient given dilaudid and reglan with improvement. CBC, BMP, lipase, and UA: unremarkable other than mildly elevated lipase. Patient discharged with a  short course of pain medication and  return precautions. Patient has given prior referral for GI consult but has been non-compliant.         Pixie Casino, PA-C 05/16/12 2219

## 2012-05-16 NOTE — Discharge Instructions (Signed)
Take pain medication as written. Take at home reglan as written. If your have worsening abdominal pain, distension, vomiting, return to the ER.

## 2012-05-16 NOTE — ED Notes (Signed)
Pt was asked if she could attempt to get an urine specimen but stated that she did not need to go the bathroom

## 2012-05-16 NOTE — ED Notes (Signed)
Pt c/o upper abd pain with N/V/D; pt c/o right sided migraine x 3 weeks after having teeth pulled; pt seen here for same in past

## 2012-05-22 ENCOUNTER — Encounter: Payer: Self-pay | Admitting: Internal Medicine

## 2012-05-23 ENCOUNTER — Ambulatory Visit (INDEPENDENT_AMBULATORY_CARE_PROVIDER_SITE_OTHER): Payer: Medicaid Other | Admitting: Internal Medicine

## 2012-05-23 ENCOUNTER — Encounter: Payer: Self-pay | Admitting: Internal Medicine

## 2012-05-23 VITALS — BP 100/80 | HR 60 | Ht 65.0 in | Wt 172.3 lb

## 2012-05-23 DIAGNOSIS — R195 Other fecal abnormalities: Secondary | ICD-10-CM

## 2012-05-23 DIAGNOSIS — R748 Abnormal levels of other serum enzymes: Secondary | ICD-10-CM

## 2012-05-23 DIAGNOSIS — R109 Unspecified abdominal pain: Secondary | ICD-10-CM

## 2012-05-23 DIAGNOSIS — G8929 Other chronic pain: Secondary | ICD-10-CM

## 2012-05-23 DIAGNOSIS — R933 Abnormal findings on diagnostic imaging of other parts of digestive tract: Secondary | ICD-10-CM

## 2012-05-23 DIAGNOSIS — R7989 Other specified abnormal findings of blood chemistry: Secondary | ICD-10-CM

## 2012-05-23 DIAGNOSIS — K625 Hemorrhage of anus and rectum: Secondary | ICD-10-CM

## 2012-05-23 DIAGNOSIS — R197 Diarrhea, unspecified: Secondary | ICD-10-CM

## 2012-05-23 MED ORDER — HYDROCODONE-ACETAMINOPHEN 5-500 MG PO TABS: 1.0000 | ORAL_TABLET | Freq: Four times a day (QID) | ORAL | Status: DC | PRN

## 2012-05-23 MED ORDER — PEG-KCL-NACL-NASULF-NA ASC-C 100 G PO SOLR: 1.0000 | Freq: Once | ORAL | Status: DC

## 2012-05-23 NOTE — Patient Instructions (Addendum)
You have been given a separate informational sheet regarding your tobacco use, the importance of quitting and local resources to help you quit.  You have been scheduled for a colonoscopy with propofol. Please follow written instructions given to you at your visit today.  Please pick up your prep kit at the pharmacy within the next 1-3 days. If you use inhalers (even only as needed) or a CPAP machine, please bring them with you on the day of your procedure.   We have sent the following medications to your pharmacy for you to pick up at your convenience: Vic[place]; DO NOT TAKE WITH TYLENOL   We will be referring you to [facility].

## 2012-05-23 NOTE — Progress Notes (Signed)
Patient ID: [name], female   DOB: 1980-12-01, 31 y.o.   MRN: [id number]  SUBJECTIVE: HPI [name] is a 31 yo female with PMH of chronic abdominal pain, pancreatitis, cholecystectomy, uterine fibroids who is seen in consultation at the request of Dr. Roseanne Reno for evaluation of chronic epigastric abdominal pain and a new development of rectal bleeding with loose stools. The patient is alone today. The patient reports ongoing issues with mid and epigastric abdominal pain. She states this is been on and off since her gallbladder was removed around 2006. She states she had cholecystectomy for "pancreatitis". She reports epigastric abdominal pain which is worse after eating. This pain is a daily event and radiates to her back. She reports her activities of daily living are limited by this pain. She also reports associated nausea and vomiting when the pain is severe. She denies hematemesis. No fevers or chills, though she does report diaphoresis with the pain is severe. Over the last several weeks she is developed loose stools along with bright red blood per rectum. Occasionally it is painful for her to pass her stool, though this is not always an issue. She sees blood in her stools on most days of the week. She denies constipation. She does report a history of loose stools and diarrhea, but reports this is intermittent. She smokes, but is trying to quit. She reports smoking approximately one cigarette per day. She is currently using Phenergan for her nausea and Tylenol for her abdominal pain. She is currently on penicillin for a dental issue.  On review of records the patient has been to the ER on multiple occasions over the last several months to a year. She's had multiple CT scans and an MRI/MRCP of her abdomen.  Review of Systems  As per history of present illness, otherwise negative   Past Medical History  Diagnosis Date  . Pancreatitis   . S/P laparoscopic cholecystectomy   . Fibroids   . Ovarian  cyst   . Chronic abdominal pain   . Anemia     Current Outpatient Prescriptions  Medication Sig Dispense Refill  . penicillin v potassium (VEETID) 500 MG tablet Take 500 mg by mouth 3 (three) times daily. For 14 days starting on 05/06/12      . HYDROcodone-acetaminophen (VICODIN) 5-500 MG per tablet Take 1 tablet by mouth every 6 (six) hours as needed for pain.  20 tablet  0  . peg 3350 powder (MOVIPREP) 100 G SOLR Take 1 kit (100 g total) by mouth once.  1 kit  0  . [DISCONTINUED] pantoprazole (PROTONIX) 40 MG tablet Take 1 tablet (40 mg total) by mouth daily at 12 noon.  30 tablet  0    Allergies  Allergen Reactions  . Celecoxib Anaphylaxis and Swelling  . Ketorolac Tromethamine Anaphylaxis  . Meperidine Hcl Anaphylaxis  . Coconut Flavor Swelling    Trouble breathing  . Zofran (Ondansetron Hcl)     Numbness in throat  . Fentanyl Rash  . Fish Allergy Rash  . Ibuprofen Rash  . Morphine Rash  . Promethazine Swelling and Rash  . Propoxyphene-Acetaminophen Rash  . Reglan (Metoclopramide) Swelling and Rash  . Tramadol Hcl Rash    Family History  Problem Relation Age of Onset  . Diabetes Mother   . Hypertension Mother   . Hypertension Father   . Diabetes Father   . Asthma Daughter     History  Substance Use Topics  . Smoking status: Current Every Day Smoker --  0.5 packs/day for 10 years    Types: Cigarettes  . Smokeless tobacco: Never Used     Comment: declined  . Alcohol Use: No    OBJECTIVE: BP 100/80  Pulse 60  Ht 5\' 5"  (1.651 m)  Wt 172 lb 4.8 oz (78.155 kg)  BMI 28.67 kg/m2  LMP 02/04/2012 Constitutional: Well-developed and well-nourished. No distress. HEENT: Normocephalic and atraumatic. Oropharynx is clear and moist. No oropharyngeal exudate. Conjunctivae are normal. No scleral icterus. Neck: Neck supple. Trachea midline. Cardiovascular: Normal rate, regular rhythm and intact distal pulses. No M/R/G Pulmonary/chest: Effort normal and breath sounds normal.  No wheezing, rales or rhonchi. Abdominal: Soft, epigastric and midabdominal tenderness without rebound or guarding, nondistended. Bowel sounds active throughout. There are no masses palpable.  Extremities: no clubbing, cyanosis, or edema Lymphadenopathy: No cervical adenopathy noted. Neurological: Alert and oriented to person place and time. Skin: Skin is warm and dry. No rashes noted. Psychiatric: Normal mood and affect. Behavior is normal.  Labs and Imaging -- Results for LAURALYN, SHADOWENS (MRN [id number]) as of 05/23/2012 12:25  Ref. Range 02/09/2012 21:14 02/14/2012 20:35 02/17/2012 02:08 02/19/2012 02:55 02/21/2012 20:50 02/25/2012 06:51 03/10/2012 08:08 03/17/2012 01:35 03/17/2012 01:54 05/16/2012 14:07  Calcium Ionized Latest Range: 1.12-1.23 mmol/L         1.19   Alkaline Phosphatase Latest Range: 39-117 U/L 83     69 63     Albumin Latest Range: 3.5-5.2 g/dL 3.7     3.9 3.6     Amylase Latest Range: 0-105 U/L  134 (H)          Lipase Latest Range: 11-59 U/L 64 (H) 162 (H) 169 (H) 51 197 (H) 68 (H) 145 (H) 50  78 (H)  AST Latest Range: 0-37 U/L 27     29 25      ALT Latest Range: 0-35 U/L 31     28 24      Total Protein Latest Range: 6.0-8.3 g/dL 7.4     7.6 7.2     Bilirubin, Direct Latest Range: 0.0-0.3 mg/dL      <4.0      Indirect Bilirubin Latest Range: 0.3-0.9 mg/dL      NOT CALCULATED      Total Bilirubin Latest Range: 0.3-1.2 mg/dL 0.3     0.5 0.3      CBC    Component Value Date/Time   WBC 6.8 05/16/2012 1407   RBC 4.68 05/16/2012 1407   HGB 12.7 05/16/2012 1407   HCT 37.7 05/16/2012 1407   PLT 274 05/16/2012 1407   MCV 80.6 05/16/2012 1407   MCH 27.1 05/16/2012 1407   MCHC 33.7 05/16/2012 1407   RDW 12.0 05/16/2012 1407   LYMPHSABS 2.4 05/16/2012 1407   MONOABS 0.7 05/16/2012 1407   EOSABS 0.4 05/16/2012 1407   BASOSABS 0.0 05/16/2012 1407   Clinical Data:  31 year old female with abdominal pain.  History of recurrent pancreatitis and cholecystectomy.   MRI ABDOMEN  WITHOUT AND WITH CONTRAST (INCLUDING MRCP) -- June 2012   Technique:  Multiplanar multisequence MR imaging of the abdomen was performed both before and after the administration of intravenous contrast. Heavily T2-weighted images of the biliary and pancreatic ducts were obtained, and three-dimensional MRCP images were rendered by post processing.   Contrast: 15 ml multihance   Comparison:  09/22/2010 CT and 03/27/2009 MRCP   Findings:  There is no evidence of intrahepatic or extrahepatic biliary dilatation.  The CBD is normal in caliber. No filling defects are identified within  the common bile duct.   I cannot identify a connection between the ventral and dorsal pancreatic ducts and pancreatic divisum is not excluded. The pancreas is otherwise unremarkable.   The patient is status post cholecystectomy.   Probable tiny hepatic cysts are noted.   The spleen, kidneys and adrenal glands are unremarkable except for a small right renal cyst.   There is no evidence of free fluid, enlarged lymph nodes or abdominal aortic aneurysm.   No bowel abnormalities are identified.   IMPRESSION: No evidence of intrahepatic or extrahepatic biliary dilatation.  No CBD filling defects identified.   I cannot identify a connection between the ventral and dorsal pancreatic ducts - suspicious for pancreatic divisum. ____________________________________________________________________________ CT ABDOMEN AND PELVIS WITH CONTRAST -- June 2013   Technique:  Multidetector CT imaging of the abdomen and pelvis was performed following the standard protocol during bolus administration of intravenous contrast.   Contrast: OMNIPAQUE IOHEXOL 300 MG/ML IJ SOLN   Comparison: 06/11/2011   Findings: No focal abnormalities seen in the liver or spleen.  The stomach, duodenum, pancreas, and adrenal glands are unremarkable. 14 mm well-defined low density lesion in the right kidney is likely a cyst.  Left  kidney is unremarkable.   No abdominal aortic aneurysm.  There is no free fluid or lymphadenopathy in the abdomen.   Imaging through the pelvis shows no free intraperitoneal fluid.  No pelvic sidewall lymphadenopathy.  The bladder is normal.  Uterus is surgically absent.  No evidence for adnexal mass.   No substantial diverticular change in the colon.  No colonic diverticulitis. Cecal tip is low in the right pelvis.  Neither the terminal ileum nor the appendix are well seen.  There is no evidence for edema or inflammation around the cecum.   Bone windows reveal no worrisome lytic or sclerotic osseous lesions.   IMPRESSION: Stable exam.  No new or acute interval findings. _____________________________________________________________________________ CT ABDOMEN AND PELVIS WITH CONTRAST  March 2013   Technique:  Multidetector CT imaging of the abdomen and pelvis was performed following the standard protocol during bolus administration of intravenous contrast.   Contrast: 80mL OMNIPAQUE IOHEXOL 300 MG/ML  SOLN   Comparison: 09/06/2011   Findings: Minimal dependent atelectasis at the lung bases.   Liver, spleen, and adrenal glands are within normal limits.   No peripancreatic inflammatory changes by CT.   Status post cholecystectomy. Stable mild intrahepatic/extrahepatic ductal dilatation, likely postsurgical.   Stable 14 x 11 mm right renal cyst.  Left kidney is within normal limits.  No hydronephrosis.   No evidence of bowel obstruction.  Appendix is not discretely visualized but there are no inflammatory changes in the right lower quadrant.   No evidence of abdominal aortic aneurysm.   No abdominopelvic ascites.   No suspicious abdominopelvic lymphadenopathy.   Status post hysterectomy.  No adnexal masses.   Bladder is within normal limits.   Visualized osseous structures are within normal limits.   IMPRESSION: Appendix is not discretely visualized.  No  inflammatory changes in the right lower quadrant to suggest acute appendicitis.   No evidence of bowel obstruction.   No peripancreatic inflammatory changes by CT.   No CT findings to account for the patient's abdominal pain.    ASSESSMENT AND PLAN: 31 yo female with PMH of chronic abdominal pain, pancreatitis, cholecystectomy, uterine fibroids who is seen for evaluation of chronic epigastric abdominal pain and a new development of rectal bleeding with loose stools.  1.  Chronic epigastric abdominal pain/question  of pancreatitis (hyperlipasemia)/N/V -- the patient has ongoing chronic abdominal pain which is exacerbated by eating. She also has had ongoing elevation in her lipase as well as transient elevations in her AST and ALT. After review of her most recent cross-sectional imaging, she does not have CT evidence for pancreatitis, however she does have stable and mild extra and intrahepatic biliary ductal dilatation. Certainly, this could be secondary to cholecystectomy, but given her transient elevations in AST and ALT, chronic pain, and elevated lipase combined with the question of pancreas divisim by MRCP I think she needs to be seen by a pancreaticobiliary specialist. I've offered referral to [facility] or [facility], in [place] is easier for the patient. I will request referral to Dr. Othelia Pulling at [facility].  I will see that he gets a copy of the patient's cross-sectional imaging and laboratory data.   --She will continue to use Tylenol as needed for her pain, and she is advised to take no more than 4000 mg in any 24-hour period. --She is asked for more strong pain medication and I have explained to her that I will not provide ongoing narcotic pain medication for her abdominal pain. I will give her a prescription for Vicodin 5 mg-325 mg #20 this time only. She understands that this will not be refilled for this issue from my office. Again she is advised this medication  contains Tylenol and that her total daily dose in any 24-hour period should not exceed 4000 mg.  2.  Loose bloody stools -- the patient has developed a change in bowel habits along with rectal bleeding. This warrants further investigation we will proceed to colonoscopy. Further recommendations to be made thereafter.

## 2012-05-24 ENCOUNTER — Telehealth: Payer: Self-pay | Admitting: Internal Medicine

## 2012-05-24 NOTE — Telephone Encounter (Signed)
lvm for pt to call me back. I was returning her call.

## 2012-05-30 ENCOUNTER — Other Ambulatory Visit: Payer: Self-pay | Admitting: Gastroenterology

## 2012-06-03 ENCOUNTER — Emergency Department (HOSPITAL_COMMUNITY)
Admission: EM | Admit: 2012-06-03 | Discharge: 2012-06-03 | Disposition: A | Discharge status: Home / Self Care | Payer: Medicaid Other | Attending: Emergency Medicine | Admitting: Emergency Medicine

## 2012-06-03 ENCOUNTER — Encounter (HOSPITAL_COMMUNITY): Payer: Self-pay | Admitting: Emergency Medicine

## 2012-06-03 DIAGNOSIS — R1012 Left upper quadrant pain: Secondary | ICD-10-CM | POA: Insufficient documentation

## 2012-06-03 DIAGNOSIS — Z8719 Personal history of other diseases of the digestive system: Secondary | ICD-10-CM | POA: Insufficient documentation

## 2012-06-03 DIAGNOSIS — Z862 Personal history of diseases of the blood and blood-forming organs and certain disorders involving the immune mechanism: Secondary | ICD-10-CM | POA: Insufficient documentation

## 2012-06-03 DIAGNOSIS — G8929 Other chronic pain: Secondary | ICD-10-CM | POA: Insufficient documentation

## 2012-06-03 DIAGNOSIS — Z8742 Personal history of other diseases of the female genital tract: Secondary | ICD-10-CM | POA: Insufficient documentation

## 2012-06-03 DIAGNOSIS — F17200 Nicotine dependence, unspecified, uncomplicated: Secondary | ICD-10-CM | POA: Insufficient documentation

## 2012-06-03 LAB — CBC WITH DIFFERENTIAL/PLATELET
Eosinophils Absolute: 0.5 10*3/uL (ref 0.0–0.7)
Hemoglobin: 13.3 g/dL (ref 12.0–15.0)
Lymphs Abs: 3 10*3/uL (ref 0.7–4.0)
MCH: 27.8 pg (ref 26.0–34.0)
MCV: 80.8 fL (ref 78.0–100.0)
Monocytes Relative: 9 % (ref 3–12)
Neutrophils Relative %: 53 % (ref 43–77)
RBC: 4.78 MIL/uL (ref 3.87–5.11)

## 2012-06-03 LAB — BASIC METABOLIC PANEL
BUN: 12 mg/dL (ref 6–23)
CO2: 26 mEq/L (ref 19–32)
Chloride: 102 mEq/L (ref 96–112)
GFR calc non Af Amer: >90 mL/min (ref >90)
Glucose, Bld: 94 mg/dL (ref 70–99)
Potassium: 3.7 mEq/L (ref 3.5–5.1)

## 2012-06-03 MED ORDER — HYDROMORPHONE HCL PF 2 MG/ML IJ SOLN: 2.0000 mg | Freq: Once | INTRAMUSCULAR | Status: DC

## 2012-06-03 MED ORDER — HYDROMORPHONE HCL PF 1 MG/ML IJ SOLN
2.0000 mg | Freq: Once | INTRAMUSCULAR | Status: DC
  Filled 2012-06-03: qty 2

## 2012-06-03 MED ORDER — HYDROMORPHONE HCL PF 1 MG/ML IJ SOLN
1.0000 mg | Freq: Once | INTRAMUSCULAR | Status: AC
  Administered 2012-06-03: 1 mg via INTRAMUSCULAR

## 2012-06-03 NOTE — ED Provider Notes (Signed)
History     CSN: [id number]  Arrival date & time 06/03/12  1430   First MD Initiated Contact with Patient 06/03/12 1725      Chief Complaint  Patient presents with  . Abdominal Pain    (Consider location/radiation/quality/duration/timing/severity/associated sxs/prior treatment) Patient is a 31 y.o. female presenting with abdominal pain. The history is provided by the patient.  Abdominal Pain The primary symptoms of the illness include abdominal pain.   patient here with left upper quadrant abdominal pain that is chronic in nature. Hasn't seen multiple times for this and is scheduled to see his surgeon to have a partial pancreatectomy. Some nausea without vomiting. No fever noted. No vaginal bleeding or discharge. No urinary symptoms. Nothing makes her symptoms better or worse.  Past Medical History  Diagnosis Date  . Pancreatitis   . S/P laparoscopic cholecystectomy   . Fibroids   . Ovarian cyst   . Chronic abdominal pain   . Anemia     Past Surgical History  Procedure Date  . Cholecystectomy   . Abdominal hysterectomy   . Dilation and curettage of uterus     Family History  Problem Relation Age of Onset  . Diabetes Mother   . Hypertension Mother   . Hypertension Father   . Diabetes Father   . Asthma Daughter     History  Substance Use Topics  . Smoking status: Current Every Day Smoker -- 0.5 packs/day for 10 years    Types: Cigarettes  . Smokeless tobacco: Never Used     Comment: declined  . Alcohol Use: No    OB History    Grav Para Term Preterm Abortions TAB SAB Ect Mult Living   4 2 1 1 2  2          Review of Systems  Gastrointestinal: Positive for abdominal pain.  All other systems reviewed and are negative.    Allergies  Celecoxib; Ketorolac tromethamine; Meperidine hcl; Coconut flavor; Zofran; Fentanyl; Fish allergy; Ibuprofen; Morphine; Promethazine; Propoxyphene-acetaminophen; Reglan; and Tramadol hcl  Home Medications  No current  outpatient prescriptions on file.  BP 124/84  Pulse 92  Temp 98.8 F (37.1 C) (Oral)  Resp 18  SpO2 98%  LMP 02/04/2012  Physical Exam  Nursing note and vitals reviewed. Constitutional: She is oriented to person, place, and time. She appears well-developed and well-nourished.  Non-toxic appearance. No distress.  HENT:  Head: Normocephalic and atraumatic.  Eyes: Conjunctivae normal, EOM and lids are normal. Pupils are equal, round, and reactive to light.  Neck: Normal range of motion. Neck supple. No tracheal deviation present. No mass present.  Cardiovascular: Normal rate, regular rhythm and normal heart sounds.  Exam reveals no gallop.   No murmur heard. Pulmonary/Chest: Effort normal and breath sounds normal. No stridor. No respiratory distress. She has no decreased breath sounds. She has no wheezes. She has no rhonchi. She has no rales.  Abdominal: Soft. Normal appearance and bowel sounds are normal. She exhibits no distension. There is tenderness in the left upper quadrant. There is rigidity and guarding. There is no rebound and no CVA tenderness.  Musculoskeletal: Normal range of motion. She exhibits no edema and no tenderness.  Neurological: She is alert and oriented to person, place, and time. She has normal strength. No cranial nerve deficit or sensory deficit. GCS eye subscore is 4. GCS verbal subscore is 5. GCS motor subscore is 6.  Skin: Skin is warm and dry. No abrasion and no rash noted.  Psychiatric:  She has a normal mood and affect. Her speech is normal and behavior is normal.    ED Course  Procedures (including critical care time)  Labs Reviewed  CBC WITH DIFFERENTIAL - Abnormal; Notable for the following:    Eosinophils Relative 6 (*)     All other components within normal limits  BASIC METABOLIC PANEL  LIPASE, BLOOD  URINALYSIS, ROUTINE W REFLEX MICROSCOPIC   No results found.   No diagnosis found.    MDM  Patient's labs reviewed and are normal. Patient  given dose of pain medication here and instructed on R. chronic pain management policy        Toy Baker, MD 06/03/12 1735

## 2012-06-03 NOTE — ED Notes (Signed)
Pt c/o LUQ pain x 2 weeks worse over last couple of days; pt seen multiple times in past for same

## 2012-06-04 ENCOUNTER — Emergency Department (HOSPITAL_COMMUNITY)
Admission: EM | Admit: 2012-06-04 | Discharge: 2012-06-04 | Disposition: A | Discharge status: Home / Self Care | Payer: Medicaid Other | Attending: Emergency Medicine | Admitting: Emergency Medicine

## 2012-06-04 ENCOUNTER — Encounter (HOSPITAL_COMMUNITY): Payer: Self-pay | Admitting: *Deleted

## 2012-06-04 DIAGNOSIS — K921 Melena: Secondary | ICD-10-CM | POA: Insufficient documentation

## 2012-06-04 DIAGNOSIS — Z862 Personal history of diseases of the blood and blood-forming organs and certain disorders involving the immune mechanism: Secondary | ICD-10-CM | POA: Insufficient documentation

## 2012-06-04 DIAGNOSIS — R112 Nausea with vomiting, unspecified: Secondary | ICD-10-CM | POA: Insufficient documentation

## 2012-06-04 DIAGNOSIS — Z8719 Personal history of other diseases of the digestive system: Secondary | ICD-10-CM | POA: Insufficient documentation

## 2012-06-04 DIAGNOSIS — F17200 Nicotine dependence, unspecified, uncomplicated: Secondary | ICD-10-CM | POA: Insufficient documentation

## 2012-06-04 DIAGNOSIS — G8929 Other chronic pain: Secondary | ICD-10-CM

## 2012-06-04 DIAGNOSIS — Z8742 Personal history of other diseases of the female genital tract: Secondary | ICD-10-CM | POA: Insufficient documentation

## 2012-06-04 DIAGNOSIS — R109 Unspecified abdominal pain: Secondary | ICD-10-CM | POA: Insufficient documentation

## 2012-06-04 MED ORDER — HYDROMORPHONE HCL PF 2 MG/ML IJ SOLN
2.0000 mg | Freq: Once | INTRAMUSCULAR | Status: AC
  Administered 2012-06-04: 2 mg via INTRAMUSCULAR
  Filled 2012-06-04: qty 1

## 2012-06-04 MED ORDER — METOCLOPRAMIDE HCL 5 MG/ML IJ SOLN: 20.0000 mg | Freq: Once | INTRAVENOUS | Status: DC

## 2012-06-04 MED ORDER — DIPHENHYDRAMINE HCL 25 MG PO CAPS: 25.0000 mg | ORAL_CAPSULE | Freq: Once | ORAL | Status: DC

## 2012-06-04 MED ORDER — DIPHENHYDRAMINE HCL 12.5 MG/5ML PO ELIX
25.0000 mg | ORAL_SOLUTION | Freq: Once | ORAL | Status: AC
  Administered 2012-06-04: 25 mg via ORAL
  Filled 2012-06-04: qty 10

## 2012-06-04 MED ORDER — DIPHENHYDRAMINE HCL 25 MG PO CAPS
25.0000 mg | ORAL_CAPSULE | Freq: Once | ORAL | Status: DC
  Filled 2012-06-04: qty 1

## 2012-06-04 MED ORDER — METOCLOPRAMIDE HCL 5 MG/ML IJ SOLN
10.0000 mg | Freq: Once | INTRAMUSCULAR | Status: AC
  Administered 2012-06-04: 10 mg via INTRAMUSCULAR
  Filled 2012-06-04: qty 2

## 2012-06-04 NOTE — ED Notes (Signed)
Pt reports abd pain, diarrhea and vomiting blood today. Pt was here for same yesterday. No distress noted at triage.

## 2012-06-04 NOTE — ED Provider Notes (Signed)
History     CSN: [id number]  Arrival date & time 06/04/12  1609   First MD Initiated Contact with Patient 06/04/12 1653      Chief Complaint  Patient presents with  . Abdominal Pain  . Emesis    (Consider location/radiation/quality/duration/timing/severity/associated sxs/prior treatment) HPI Comments: Patient seen multiple times in the past for similar complaints. The patient has a history of chronic abdominal pain. She has had pancreatitis in the past but denies any alcohol use. She reports that she has had a flare building up over the past 2 weeks. She was seen in the [facility] last night due to persistent vomiting and seen some blood in her emesis. She denies any lightheadedness or syncope. She reports that she has had blood in her stool in the past and has a appointment in the next 2 weeks up with the [facility] for colonoscopy and endoscopy. She was recently [facility] with [facility] and has appointment with a Dr. Logan Bores on December 4. She reports that her physicians are trying to get her in touch with a surgical specialist at [facility] for surgical evaluation of her chronic pancreatitis. She reports that they had shown her pictures of her pancreas which is abnormally large and that her doctors are distended. She reports that she has nausea suppositories at home which are not helping. She does not have any pain medication at home. She has not taken any pain medicines other than what she was given by injection last night. She reports that it did not help much and that when she got home her symptoms rebound.  Patient is a 31 y.o. female presenting with abdominal pain and vomiting. The history is provided by the patient and medical records.  Abdominal Pain The primary symptoms of the illness include abdominal pain, nausea and vomiting. The primary symptoms of the illness do not include shortness of breath.  Emesis  Associated symptoms include abdominal pain.    Past Medical  History  Diagnosis Date  . Pancreatitis   . S/P laparoscopic cholecystectomy   . Fibroids   . Ovarian cyst   . Chronic abdominal pain   . Anemia     Past Surgical History  Procedure Date  . Cholecystectomy   . Abdominal hysterectomy   . Dilation and curettage of uterus     Family History  Problem Relation Age of Onset  . Diabetes Mother   . Hypertension Mother   . Hypertension Father   . Diabetes Father   . Asthma Daughter     History  Substance Use Topics  . Smoking status: Current Every Day Smoker -- 0.5 packs/day for 10 years    Types: Cigarettes  . Smokeless tobacco: Never Used     Comment: declined  . Alcohol Use: No    OB History    Grav Para Term Preterm Abortions TAB SAB Ect Mult Living   4 2 1 1 2  2          Review of Systems  Respiratory: Negative for shortness of breath.   Cardiovascular: Negative for chest pain.  Gastrointestinal: Positive for nausea, vomiting, abdominal pain and blood in stool.  All other systems reviewed and are negative.    Allergies  Celecoxib; Ketorolac tromethamine; Meperidine hcl; Coconut flavor; Zofran; Fentanyl; Fish allergy; Ibuprofen; Morphine; Promethazine; Propoxyphene-acetaminophen; and Tramadol hcl  Home Medications  No current outpatient prescriptions on file.  BP 125/80  Pulse 91  Temp 98.1 F (36.7 C) (Oral)  Resp 16  SpO2 97%  LMP 02/04/2012  Physical Exam  Nursing note and vitals reviewed. Constitutional: She appears well-developed and well-nourished.  HENT:  Head: Normocephalic and atraumatic.  Eyes: Pupils are equal, round, and reactive to light.  Neck: Normal range of motion.  Cardiovascular: Normal rate.   Pulmonary/Chest: Effort normal and breath sounds normal. No respiratory distress.  Abdominal: Soft. She exhibits no distension. There is no tenderness. There is no rebound and no guarding.  Neurological: She is alert.  Skin: Skin is warm.    ED Course  Procedures (including critical  care time)  Labs Reviewed - No data to display No results found.   1. Chronic abdominal pain     ra sat is 99% and i interpret to be normal.    6:55 PM Pt feels improved, has taken ice chips and water without vomiting.  Pt would like to go home.    MDM  Pt with multiple visits for same in the past.  Pt had labs yesterday that was normal.  Goal today is to give IM meds and reassess for some clinical improvement.  Pt is in no distress, no vomitign seen in the ED, normal vital signs.  I do not think complete resolution of symptoms is reasonable goal, adn no way to tell by signs if symptoms are improving since VS and presence of no tenderness on exam cannot improve if not abn to begin with.  If she can tolerate PO liquids, will d/c home.  Pt has follow up on Wednesday/          Gavin Pound. Oletta Lamas, MD 06/04/12 1857

## 2012-06-05 ENCOUNTER — Encounter (INDEPENDENT_AMBULATORY_CARE_PROVIDER_SITE_OTHER): Payer: Self-pay | Admitting: General Surgery

## 2012-06-13 ENCOUNTER — Ambulatory Visit (AMBULATORY_SURGERY_CENTER): Payer: Medicaid Other | Admitting: Internal Medicine

## 2012-06-13 ENCOUNTER — Encounter: Payer: Self-pay | Admitting: Internal Medicine

## 2012-06-13 VITALS — BP 139/77 | HR 71 | Temp 96.8°F | Resp 25 | Ht 65.0 in | Wt 172.0 lb

## 2012-06-13 DIAGNOSIS — D126 Benign neoplasm of colon, unspecified: Secondary | ICD-10-CM

## 2012-06-13 DIAGNOSIS — K625 Hemorrhage of anus and rectum: Secondary | ICD-10-CM

## 2012-06-13 DIAGNOSIS — R195 Other fecal abnormalities: Secondary | ICD-10-CM

## 2012-06-13 DIAGNOSIS — R197 Diarrhea, unspecified: Secondary | ICD-10-CM

## 2012-06-13 MED ORDER — SODIUM CHLORIDE 0.9 % IV SOLN: 500.0000 mL | INTRAVENOUS | Status: DC

## 2012-06-13 NOTE — Progress Notes (Signed)
Propofol given over incremental dosages 

## 2012-06-13 NOTE — Progress Notes (Signed)
Called to room to assist during endoscopic procedure.  Patient ID and intended procedure confirmed with present staff. Received instructions for my participation in the procedure from the performing physician.  

## 2012-06-13 NOTE — Patient Instructions (Addendum)
YOU HAD AN ENDOSCOPIC PROCEDURE TODAY AT THE [facility]: Refer to the procedure report that was given to you for any specific questions about what was found during the examination.  If the procedure report does not answer your questions, please call your gastroenterologist to clarify.  If you requested that your care partner not be given the details of your procedure findings, then the procedure report has been included in a sealed envelope for you to review at your convenience later.  YOU SHOULD EXPECT: Some feelings of bloating in the abdomen. Passage of more gas than usual.  Walking can help get rid of the air that was put into your GI tract during the procedure and reduce the bloating. If you had a lower endoscopy (such as a colonoscopy or flexible sigmoidoscopy) you may notice spotting of blood in your stool or on the toilet paper. If you underwent a bowel prep for your procedure, then you may not have a normal bowel movement for a few days.  DIET: Your first meal following the procedure should be a light meal and then it is ok to progress to your normal diet.  A half-sandwich or bowl of soup is an example of a good first meal.  Heavy or fried foods are harder to digest and may make you feel nauseous or bloated.  Likewise meals heavy in dairy and vegetables can cause extra gas to form and this can also increase the bloating.  Drink plenty of fluids but you should avoid alcoholic beverages for 24 hours.  ACTIVITY: Your care partner should take you home directly after the procedure.  You should plan to take it easy, moving slowly for the rest of the day.  You can resume normal activity the day after the procedure however you should NOT DRIVE or use heavy machinery for 24 hours (because of the sedation medicines used during the test).    SYMPTOMS TO REPORT IMMEDIATELY: A gastroenterologist can be reached at any hour.  During normal business hours, 8:30 AM to 5:00 PM Monday through Friday,  call [phone].  After hours and on weekends, please call the GI answering service at [phone] who will take a message and have the physician on call contact you.   Following lower endoscopy (colonoscopy or flexible sigmoidoscopy):  Excessive amounts of blood in the stool  Significant tenderness or worsening of abdominal pains  Swelling of the abdomen that is new, acute  Fever of 100F or higher  Following upper endoscopy (EGD)  Vomiting of blood or coffee ground material  New chest pain or pain under the shoulder blades  Painful or persistently difficult swallowing  New shortness of breath  Fever of 100F or higher  Black, tarry-looking stools  FOLLOW UP: If any biopsies were taken you will be contacted by phone or by letter within the next 1-3 weeks.  Call your gastroenterologist if you have not heard about the biopsies in 3 weeks.  Our staff will call the home number listed on your records the next business day following your procedure to check on you and address any questions or concerns that you may have at that time regarding the information given to you following your procedure. This is a courtesy call and so if there is no answer at the home number and we have not heard from you through the emergency physician on call, we will assume that you have returned to your regular daily activities without incident.  SIGNATURES/CONFIDENTIALITY: You and/or your care   partner have signed paperwork which will be entered into your electronic medical record.  These signatures attest to the fact that that the information above on your After Visit Summary has been reviewed and is understood.  Full responsibility of the confidentiality of this discharge information lies with you and/or your care-partner.  

## 2012-06-13 NOTE — Op Note (Signed)
[facility] [address] [place], [address]   COLONOSCOPY PROCEDURE REPORT  PATIENT: [name], [name]  MR#: [id number] BIRTHDATE: February 01, 1981 , 31  yrs. old GENDER: Female ENDOSCOPIST: Beverley Fiedler, MD REFERRED [id number], Sami PROCEDURE DATE:  06/13/2012 PROCEDURE:   Colonoscopy with cold biopsy polypectomy ASA CLASS:   Class III INDICATIONS:rectal bleeding. MEDICATIONS: MAC sedation, administered by CRNA and propofol (Diprivan) 300mg  IV  DESCRIPTION OF PROCEDURE:   After the risks benefits and alternatives of the procedure were thoroughly explained, informed consent was obtained.  A digital rectal exam revealed an anal fissure and A digital rectal exam revealed no rectal mass.   The LB [id number]  endoscope was introduced through the anus and advanced to the cecum, which was identified by both the appendix and ileocecal valve. No adverse events experienced.   The quality of the prep was Moviprep fair only after copious irrigation and lavage. The instrument was then slowly withdrawn as the colon was fully examined.   COLON FINDINGS: A sessile polyp measuring 3 mm in size was found in the sigmoid colon.  A polypectomy was performed with cold forceps. The resection was complete and the polyp tissue was completely retrieved.   The colonic mucosa appeared normal throughout the entire examined colon.  Retroflexed views revealed an anal fissure. The time to cecum=4 minutes 04 seconds.  Withdrawal time=14 minutes 34 seconds.  The scope was withdrawn and the procedure completed. COMPLICATIONS: There were no complications.  ENDOSCOPIC IMPRESSION: 1.   Sessile polyp measuring 3 mm in size was found in the sigmoid colon; polypectomy was performed with cold forceps 2.   The colonic mucosa appeared normal throughout the entire examined colon  RECOMMENDATIONS: 1.  Await pathology results 2.  Timing of repeat colonoscopy will be determined by  pathology findings. 3.  You will receive a letter within 1-2 weeks with the results of your biopsy as well as final recommendations.  Please call my office if you have not received a letter after 3 weeks. 4.  If rectal bleeding returns, contact the office.  Anal fissure bleeding/pain can be treated with topical diltiazem gel. 5.  Awaiting referral to [facility] for biliary/pancreas evaluation   eSigned:  Beverley Fiedler, MD 06/13/2012 2:07 PM   cc: Quitman Livings MD,  and The Patient

## 2012-06-13 NOTE — Progress Notes (Signed)
Patient did not have preoperative order for IV antibiotic SSI prophylaxis. (G8918)  Patient did not experience any of the following events: a burn prior to discharge; a fall within the facility; wrong site/side/patient/procedure/implant event; or a hospital transfer or hospital admission upon discharge from the facility. (G8907)  

## 2012-06-14 ENCOUNTER — Telehealth: Payer: Self-pay | Admitting: Gastroenterology

## 2012-06-14 ENCOUNTER — Telehealth: Payer: Self-pay | Admitting: *Deleted

## 2012-06-14 NOTE — Telephone Encounter (Signed)
Faxed pt. Notes, labs, imaging and procedures to Dr. Logan Bores at [facility] Phone# [phone]  Fax# [phone] For referral

## 2012-06-14 NOTE — Telephone Encounter (Signed)
  Follow up Call-  Call back number 06/13/2012  Post procedure Call Back phone  # [phone]  Permission to leave phone message Yes     Patient questions:  Do you have a fever, pain , or abdominal swelling? no Pain Score  0 *  Have you tolerated food without any problems? yes  Have you been able to return to your normal activities? yes  Do you have any questions about your discharge instructions: Diet   no Medications  no Follow up visit  no  Do you have questions or concerns about your Care? no  Actions: * If pain score is 4 or above: No action needed, pain <4. Patient stating she does have some cramping, gas. Encouraged patient to drink warm fluids. Patient denies fever, pain,nausea, vomiting.

## 2012-06-19 ENCOUNTER — Encounter: Payer: Self-pay | Admitting: Internal Medicine

## 2012-06-20 ENCOUNTER — Telehealth: Payer: Self-pay | Admitting: Gastroenterology

## 2012-06-20 NOTE — Telephone Encounter (Signed)
Called Dr. Othelia Pulling' office confirmed they got pt's records and once he reviews them they will call the pt. To schedule an appointment.

## 2012-06-22 ENCOUNTER — Encounter (HOSPITAL_COMMUNITY): Payer: Self-pay

## 2012-06-22 ENCOUNTER — Emergency Department (HOSPITAL_COMMUNITY)
Admission: EM | Admit: 2012-06-22 | Discharge: 2012-06-23 | Disposition: A | Discharge status: Home / Self Care | Payer: Medicaid Other | Attending: Emergency Medicine | Admitting: Emergency Medicine

## 2012-06-22 DIAGNOSIS — Z79899 Other long term (current) drug therapy: Secondary | ICD-10-CM | POA: Insufficient documentation

## 2012-06-22 DIAGNOSIS — Z8742 Personal history of other diseases of the female genital tract: Secondary | ICD-10-CM | POA: Insufficient documentation

## 2012-06-22 DIAGNOSIS — Z8719 Personal history of other diseases of the digestive system: Secondary | ICD-10-CM | POA: Insufficient documentation

## 2012-06-22 DIAGNOSIS — G8929 Other chronic pain: Secondary | ICD-10-CM | POA: Insufficient documentation

## 2012-06-22 DIAGNOSIS — Z862 Personal history of diseases of the blood and blood-forming organs and certain disorders involving the immune mechanism: Secondary | ICD-10-CM | POA: Insufficient documentation

## 2012-06-22 DIAGNOSIS — Z9889 Other specified postprocedural states: Secondary | ICD-10-CM | POA: Insufficient documentation

## 2012-06-22 DIAGNOSIS — R109 Unspecified abdominal pain: Secondary | ICD-10-CM | POA: Insufficient documentation

## 2012-06-22 LAB — CBC WITH DIFFERENTIAL/PLATELET
Basophils Absolute: 0 10*3/uL (ref 0.0–0.1)
Basophils Relative: 1 % (ref 0–1)
Eosinophils Absolute: 0.6 10*3/uL (ref 0.0–0.7)
HCT: 36.5 % (ref 36.0–46.0)
Hemoglobin: 12.5 g/dL (ref 12.0–15.0)
MCH: 27.6 pg (ref 26.0–34.0)
MCHC: 34.2 g/dL (ref 30.0–36.0)
Monocytes Absolute: 0.5 10*3/uL (ref 0.1–1.0)
Monocytes Relative: 6 % (ref 3–12)
Neutro Abs: 3 10*3/uL (ref 1.7–7.7)
RDW: 12.3 % (ref 11.5–15.5)

## 2012-06-22 LAB — COMPREHENSIVE METABOLIC PANEL
AST: 51 U/L — ABNORMAL HIGH (ref 0–37)
Albumin: 3.7 g/dL (ref 3.5–5.2)
BUN: 14 mg/dL (ref 6–23)
Calcium: 9.2 mg/dL (ref 8.4–10.5)
Creatinine, Ser: 0.69 mg/dL (ref 0.50–1.10)
Total Protein: 7.6 g/dL (ref 6.0–8.3)

## 2012-06-22 LAB — LIPASE, BLOOD: Lipase: 52 U/L (ref 11–59)

## 2012-06-22 MED ORDER — HYDROMORPHONE HCL PF 1 MG/ML IJ SOLN
1.0000 mg | Freq: Once | INTRAMUSCULAR | Status: AC
  Administered 2012-06-23: 1 mg via INTRAMUSCULAR
  Filled 2012-06-22: qty 1

## 2012-06-22 NOTE — ED Notes (Signed)
Pt reports a "pancreatitis attack" upper abd pain, N/V/D x2 weeks. Pt reports hx of pancreatitis and feels her symptoms are identical

## 2012-06-22 NOTE — ED Provider Notes (Signed)
History     CSN: [id number]  Arrival date & time 06/22/12  2249   First MD Initiated Contact with Patient 06/22/12 2347      Chief Complaint  Patient presents with  . Abdominal Pain    (Consider location/radiation/quality/duration/timing/severity/associated sxs/prior treatment) HPI Comments: 31 year old female with chronic abdominal pain related to pancreatitis. She has been seen multiple times in the emergency department for the same complaint. She has been evaluated by gastroenterology including a colonoscopy 9 days ago which showed single polyp which was noncancerous. She has also been referred to a surgeon at [facility] who she will see in approximately 6 weeks. Her pain today has been going on for approximately 2 weeks, she has associated nausea and diarrhea, this is a chronic problem for her, she denies fevers, chills, rashes.  Patient is a 31 y.o. female presenting with abdominal pain. The history is provided by the patient and medical records.  Abdominal Pain The primary symptoms of the illness include abdominal pain.    Past Medical History  Diagnosis Date  . Pancreatitis   . S/P laparoscopic cholecystectomy   . Fibroids   . Ovarian cyst   . Chronic abdominal pain   . Anemia     Past Surgical History  Procedure Date  . Cholecystectomy   . Abdominal hysterectomy   . Dilation and curettage of uterus     Family History  Problem Relation Age of Onset  . Diabetes Mother   . Hypertension Mother   . Hypertension Father   . Diabetes Father   . Asthma Daughter     History  Substance Use Topics  . Smoking status: Current Every Day Smoker -- 0.5 packs/day for 10 years    Types: Cigarettes  . Smokeless tobacco: Never Used     Comment: declined  . Alcohol Use: No    OB History    Grav Para Term Preterm Abortions TAB SAB Ect Mult Living   4 2 1 1 2  2          Review of Systems  Gastrointestinal: Positive for abdominal pain.  All other systems  reviewed and are negative.    Allergies  Celecoxib; Ketorolac tromethamine; Meperidine hcl; Coconut flavor; Zofran; Fentanyl; Fish allergy; Ibuprofen; Morphine; Promethazine; Propoxyphene-acetaminophen; and Tramadol hcl  Home Medications   Current Outpatient Rx  Name  Route  Sig  Dispense  Refill  . OXYCODONE HCL 10 MG PO TABS   Oral   Take 10 mg by mouth 3 (three) times daily.           BP 123/78  Pulse 90  Temp 98.3 F (36.8 C) (Oral)  Resp 16  SpO2 99%  LMP 02/04/2012  Physical Exam  Nursing note and vitals reviewed. Constitutional: She appears well-developed and well-nourished. No distress.  HENT:  Head: Normocephalic and atraumatic.  Mouth/Throat: Oropharynx is clear and moist. No oropharyngeal exudate.  Eyes: Conjunctivae normal and EOM are normal. Pupils are equal, round, and reactive to light. Right eye exhibits no discharge. Left eye exhibits no discharge. No scleral icterus.  Neck: Normal range of motion. Neck supple. No JVD present. No thyromegaly present.  Cardiovascular: Normal rate, regular rhythm, normal heart sounds and intact distal pulses.  Exam reveals no gallop and no friction rub.   No murmur heard. Pulmonary/Chest: Effort normal and breath sounds normal. No respiratory distress. She has no wheezes. She has no rales.  Abdominal: Soft. Bowel sounds are normal. She exhibits no distension and no mass. There  is tenderness ( Mild epigastric tenderness, mild periumbilical tenderness).       No pain at the right lower quadrant, no tympanitic sounds, normal bowel sounds  Musculoskeletal: Normal range of motion. She exhibits no edema and no tenderness.  Lymphadenopathy:    She has no cervical adenopathy.  Neurological: She is alert. Coordination normal.  Skin: Skin is warm and dry. No rash noted. No erythema.  Psychiatric: She has a normal mood and affect. Her behavior is normal.    ED Course  Procedures (including critical care time)  Labs Reviewed   CBC WITH DIFFERENTIAL - Abnormal; Notable for the following:    Neutrophils Relative 40 (*)     Eosinophils Relative 8 (*)     All other components within normal limits  COMPREHENSIVE METABOLIC PANEL - Abnormal; Notable for the following:    Glucose, Bld 109 (*)     AST 51 (*)     ALT 51 (*)     Total Bilirubin 0.2 (*)     All other components within normal limits  LIPASE, BLOOD  URINALYSIS, ROUTINE W REFLEX MICROSCOPIC   No results found.   1. Abdominal pain       MDM  The patient appears well, has normal vital signs and has an exam consistent with her chronic abdominal pain. I have evaluated this patient multiple times in the past and I find that her exam today is unchanged. She will be given a dose of pain medication and discharged home.  I have reviewed her lab work, she has a normal lipase, baseline liver function, normal CBC without leukocytosis.  Pt stable appearing, chronic pain - pt has been made aware of her lab work and indications for f/u.      Vida Roller, MD 06/23/12 [id number]

## 2012-06-23 LAB — URINALYSIS, ROUTINE W REFLEX MICROSCOPIC
Glucose, UA: NEGATIVE mg/dL
Ketones, ur: NEGATIVE mg/dL
Leukocytes, UA: NEGATIVE
Nitrite: NEGATIVE
Specific Gravity, Urine: 1.03 (ref 1.005–1.030)
pH: 6 (ref 5.0–8.0)

## 2012-06-23 NOTE — ED Notes (Signed)
Pt stated she used the restroom before she came back and can not void at this time.

## 2012-06-25 ENCOUNTER — Emergency Department (HOSPITAL_COMMUNITY)
Admission: EM | Admit: 2012-06-25 | Discharge: 2012-06-26 | Disposition: A | Discharge status: Home / Self Care | Payer: Medicaid Other | Attending: Emergency Medicine | Admitting: Emergency Medicine

## 2012-06-25 ENCOUNTER — Emergency Department (HOSPITAL_COMMUNITY): Payer: Medicaid Other

## 2012-06-25 ENCOUNTER — Encounter (HOSPITAL_COMMUNITY): Payer: Self-pay | Admitting: *Deleted

## 2012-06-25 DIAGNOSIS — G894 Chronic pain syndrome: Secondary | ICD-10-CM

## 2012-06-25 DIAGNOSIS — Z79899 Other long term (current) drug therapy: Secondary | ICD-10-CM | POA: Insufficient documentation

## 2012-06-25 DIAGNOSIS — R112 Nausea with vomiting, unspecified: Secondary | ICD-10-CM | POA: Insufficient documentation

## 2012-06-25 DIAGNOSIS — Z90710 Acquired absence of both cervix and uterus: Secondary | ICD-10-CM | POA: Insufficient documentation

## 2012-06-25 DIAGNOSIS — K861 Other chronic pancreatitis: Secondary | ICD-10-CM | POA: Insufficient documentation

## 2012-06-25 DIAGNOSIS — F17200 Nicotine dependence, unspecified, uncomplicated: Secondary | ICD-10-CM | POA: Insufficient documentation

## 2012-06-25 DIAGNOSIS — R197 Diarrhea, unspecified: Secondary | ICD-10-CM | POA: Insufficient documentation

## 2012-06-25 DIAGNOSIS — Z862 Personal history of diseases of the blood and blood-forming organs and certain disorders involving the immune mechanism: Secondary | ICD-10-CM | POA: Insufficient documentation

## 2012-06-25 DIAGNOSIS — Z9089 Acquired absence of other organs: Secondary | ICD-10-CM | POA: Insufficient documentation

## 2012-06-25 DIAGNOSIS — G8929 Other chronic pain: Secondary | ICD-10-CM | POA: Insufficient documentation

## 2012-06-25 DIAGNOSIS — R630 Anorexia: Secondary | ICD-10-CM | POA: Insufficient documentation

## 2012-06-25 DIAGNOSIS — Z8742 Personal history of other diseases of the female genital tract: Secondary | ICD-10-CM | POA: Insufficient documentation

## 2012-06-25 LAB — COMPREHENSIVE METABOLIC PANEL
ALT: 22 U/L (ref 0–35)
AST: 20 U/L (ref 0–37)
CO2: 23 mEq/L (ref 19–32)
Calcium: 9.3 mg/dL (ref 8.4–10.5)
GFR calc non Af Amer: >90 mL/min (ref >90)
Potassium: 3.8 mEq/L (ref 3.5–5.1)
Sodium: 140 mEq/L (ref 135–145)
Total Protein: 7.6 g/dL (ref 6.0–8.3)

## 2012-06-25 LAB — LIPASE, BLOOD: Lipase: 90 U/L — ABNORMAL HIGH (ref 11–59)

## 2012-06-25 MED ORDER — SODIUM CHLORIDE 0.9 % IV SOLN
1000.0000 mL | Freq: Once | INTRAVENOUS | Status: AC
  Administered 2012-06-26: 1000 mL via INTRAVENOUS

## 2012-06-25 NOTE — ED Notes (Addendum)
C/o abd pain, ongoing for 3.5 weeks, getting worse, PCP office is closed for holidays. Also reports nvd. Also face itching & hot and cold shills. has tried benadryl for itching. Denies known fever.

## 2012-06-25 NOTE — ED Notes (Signed)
Patient transported to X-ray 

## 2012-06-25 NOTE — ED Notes (Signed)
Fayrene Fearing RN attempted 2 IV start. Unsuccessful. IV team in department and will come to room.

## 2012-06-26 LAB — CBC WITH DIFFERENTIAL/PLATELET
Eosinophils Relative: 5 % (ref 0–5)
Lymphocytes Relative: 44 % (ref 12–46)
MCH: 27.8 pg (ref 26.0–34.0)
Monocytes Absolute: 0.6 10*3/uL (ref 0.1–1.0)
Neutrophils Relative %: 42 % — ABNORMAL LOW (ref 43–77)
Platelets: 60 10*3/uL — ABNORMAL LOW (ref 150–400)
RBC: 4.82 MIL/uL (ref 3.87–5.11)
WBC: 6.5 10*3/uL (ref 4.0–10.5)

## 2012-06-26 LAB — CBC
Platelets: 264 10*3/uL (ref 150–400)
RDW: 12.4 % (ref 11.5–15.5)
WBC: 8.1 10*3/uL (ref 4.0–10.5)

## 2012-06-26 LAB — URINALYSIS, ROUTINE W REFLEX MICROSCOPIC
Bilirubin Urine: NEGATIVE
Hgb urine dipstick: NEGATIVE
Specific Gravity, Urine: 1.036 — ABNORMAL HIGH (ref 1.005–1.030)
pH: 6 (ref 5.0–8.0)

## 2012-06-26 LAB — URINE MICROSCOPIC-ADD ON

## 2012-06-26 MED ORDER — HYDROMORPHONE HCL PF 1 MG/ML IJ SOLN
1.0000 mg | Freq: Once | INTRAMUSCULAR | Status: AC
  Administered 2012-06-26: 1 mg via INTRAVENOUS
  Filled 2012-06-26: qty 1

## 2012-06-26 MED ORDER — DIPHENHYDRAMINE HCL 50 MG/ML IJ SOLN
25.0000 mg | Freq: Once | INTRAMUSCULAR | Status: AC
  Administered 2012-06-26: 25 mg via INTRAVENOUS
  Filled 2012-06-26: qty 1

## 2012-06-26 MED ORDER — METOCLOPRAMIDE HCL 10 MG PO TABS: 10.0000 mg | ORAL_TABLET | Freq: Four times a day (QID) | ORAL | Status: DC

## 2012-06-26 NOTE — ED Provider Notes (Signed)
History     CSN: [id number]  Arrival date & time 06/25/12  2108   First MD Initiated Contact with Patient 06/26/12 0030      Chief Complaint  Patient presents with  . Abdominal Pain    (Consider location/radiation/quality/duration/timing/severity/associated sxs/prior treatment) HPI Comments: Ms. Wheelwright presents reporting upper abdominal pain, nausea, vomiting, and loose stools.  She reports her symptoms have been ongoing for several weeks.  She has a history of pancreatitis and has been treated through the ER previously for similar pain.  She denies fever, hematemesis, melena, and hematochezia.  Patient is a 31 y.o. female presenting with abdominal pain. The history is provided by the patient.  Abdominal Pain The primary symptoms of the illness include abdominal pain, nausea, vomiting and diarrhea. The primary symptoms of the illness do not include fever or fatigue. The current episode started more than 2 days ago. The onset of the illness was gradual. The problem has been gradually worsening.  The patient states that she believes she is currently not pregnant. The patient has had a change in bowel habit. Additional symptoms associated with the illness include anorexia. Symptoms associated with the illness do not include chills, diaphoresis, heartburn, constipation, urgency, hematuria, frequency or back pain. Associated medical issues comments: pancreatitis..    Past Medical History  Diagnosis Date  . Pancreatitis   . S/P laparoscopic cholecystectomy   . Fibroids   . Ovarian cyst   . Chronic abdominal pain   . Anemia     Past Surgical History  Procedure Date  . Cholecystectomy   . Abdominal hysterectomy   . Dilation and curettage of uterus     Family History  Problem Relation Age of Onset  . Diabetes Mother   . Hypertension Mother   . Hypertension Father   . Diabetes Father   . Asthma Daughter     History  Substance Use Topics  . Smoking status: Current Every Day  Smoker -- 0.5 packs/day for 10 years    Types: Cigarettes  . Smokeless tobacco: Never Used     Comment: declined  . Alcohol Use: No    OB History    Grav Para Term Preterm Abortions TAB SAB Ect Mult Living   4 2 1 1 2  2          Review of Systems  Constitutional: Positive for appetite change. Negative for fever, chills, diaphoresis, activity change and fatigue.  HENT: Negative.   Respiratory: Negative.   Gastrointestinal: Positive for nausea, vomiting, abdominal pain, diarrhea and anorexia. Negative for heartburn, constipation and blood in stool.  Genitourinary: Negative for urgency, frequency and hematuria.  Musculoskeletal: Negative for back pain.  Neurological: Negative.   Psychiatric/Behavioral: Negative.     Allergies  Celecoxib; Ketorolac tromethamine; Meperidine hcl; Coconut flavor; Zofran; Fentanyl; Fish allergy; Ibuprofen; Morphine; Promethazine; Propoxyphene-acetaminophen; and Tramadol hcl  Home Medications   Current Outpatient Rx  Name  Route  Sig  Dispense  Refill  . DIPHENHYDRAMINE HCL 12.5 MG/5ML PO LIQD   Oral   Take 50 mg by mouth 3 (three) times daily as needed. For itching           BP 114/83  Pulse 78  Temp 98.3 F (36.8 C) (Oral)  Resp 18  SpO2 99%  LMP 02/04/2012  Physical Exam  Nursing note and vitals reviewed. Constitutional: She is oriented to person, place, and time. She appears well-developed and well-nourished. No distress.  HENT:  Head: Normocephalic and atraumatic.  Right Ear: External ear  normal.  Left Ear: External ear normal.  Nose: Nose normal.  Mouth/Throat: Oropharynx is clear and moist. No oropharyngeal exudate.  Eyes: Conjunctivae normal are normal. Pupils are equal, round, and reactive to light. Right eye exhibits no discharge. Left eye exhibits no discharge. No scleral icterus.  Neck: Normal range of motion. Neck supple. No JVD present. No tracheal deviation present.  Cardiovascular: Normal rate, regular rhythm, normal  heart sounds and intact distal pulses.  Exam reveals no gallop and no friction rub.   No murmur heard. Pulmonary/Chest: Effort normal and breath sounds normal. No stridor. No respiratory distress. She has no wheezes. She has no rales. She exhibits no tenderness.  Abdominal: Soft. Bowel sounds are normal. She exhibits no distension and no mass. There is tenderness (epigastric). There is no rebound and no guarding.  Musculoskeletal: Normal range of motion. She exhibits no edema and no tenderness.  Lymphadenopathy:    She has no cervical adenopathy.  Neurological: She is alert and oriented to person, place, and time. No cranial nerve deficit.  Skin: Skin is warm and dry. No rash noted. She is not diaphoretic. No cyanosis or erythema. No pallor. Nails show no clubbing.  Psychiatric: She has a normal mood and affect.    ED Course  Procedures (including critical care time)  Labs Reviewed  LIPASE, BLOOD - Abnormal; Notable for the following:    Lipase 90 (*)     All other components within normal limits  CBC WITH DIFFERENTIAL - Abnormal; Notable for the following:    Platelets 60 (*)  PLATELET COUNT CONFIRMED BY SMEAR   Neutrophils Relative 42 (*)     All other components within normal limits  COMPREHENSIVE METABOLIC PANEL - Abnormal; Notable for the following:    Glucose, Bld 101 (*)     Total Bilirubin 0.2 (*)     All other components within normal limits  URINALYSIS, ROUTINE W REFLEX MICROSCOPIC - Abnormal; Notable for the following:    Specific Gravity, Urine 1.036 (*)     Protein, ur 30 (*)     All other components within normal limits  URINE MICROSCOPIC-ADD ON - Abnormal; Notable for the following:    Squamous Epithelial / LPF FEW (*)     Bacteria, UA FEW (*)     All other components within normal limits   Dg Abd Acute W/chest  06/25/2012  *RADIOLOGY REPORT*  Clinical Data: Nausea and vomiting.  ACUTE ABDOMEN SERIES (ABDOMEN 2 VIEW & CHEST 1 VIEW)  Comparison: CT abdomen and  pelvis 12/15/2011 and chest and two views abdomen 09/02/2011.  Findings: Lungs are clear.  Heart size is normal.  No pneumothorax or pleural effusion.  Two views of the abdomen show no free intraperitoneal air.  Bowel gas pattern is normal.  Cholecystectomy clips noted.  No bony abnormality is identified.  IMPRESSION: Negative exam.   Original Report Authenticated By: Holley Dexter, M.D.      No diagnosis found.    MDM  Pt presents for evaluation of abdominal pain which she attributes to chronic pancreatitis.  She appears nontoxic, note stable VS, NAD.  She has had 5 ER visits since 11/12 for similar pain.  She states she has an appt in January with a surgeon in [place] that specializes in care for pt's with recurrent pancreatitis.  The lipase today is slightly elevated.  She has no evidence of peritonitis and the remainder of her labs are unremarkable.  Will provide symptomatic care but secondary to a pattern of  ER usage that appears to be abusive, will not prescribe narcotic medications for outpt use.  Encouraged follow-up with her PMD and with a pain mgmnt specialist.  Advised po clears for the next 24-48 hours with instructions to the advance her diet thereafter.  0140.  On follow-up review of her labs, she has a new onset thrombocytopenia.  She denies any spontaneous bleeding.  She has no prior history of a low platelet count.  She does not appear toxic and her clinical exam does not meet criteria for sepsis.  0430.  Repeat CBC performed.  Thrombocytopenia not noted.  It appears the previous lab was inaccurate.  Plan discharge home as described above.    Tobin Chad, MD 06/26/12 [phone]

## 2012-06-26 NOTE — ED Notes (Signed)
Pt reports history of pancreatitis. States that she is having acute pancreatitis. States that she is unable to go to the PCP due to the holidays. States abdominal pain for 3.5 weeks. Reports N/V/D, though pt has not vomited while in the ER.

## 2012-06-27 ENCOUNTER — Encounter (HOSPITAL_COMMUNITY): Payer: Self-pay | Admitting: *Deleted

## 2012-06-27 ENCOUNTER — Emergency Department (HOSPITAL_COMMUNITY)
Admission: EM | Admit: 2012-06-27 | Discharge: 2012-06-27 | Disposition: A | Discharge status: Home / Self Care | Payer: Medicaid Other | Attending: Emergency Medicine | Admitting: Emergency Medicine

## 2012-06-27 DIAGNOSIS — Z9089 Acquired absence of other organs: Secondary | ICD-10-CM | POA: Insufficient documentation

## 2012-06-27 DIAGNOSIS — F17200 Nicotine dependence, unspecified, uncomplicated: Secondary | ICD-10-CM | POA: Insufficient documentation

## 2012-06-27 DIAGNOSIS — R1013 Epigastric pain: Secondary | ICD-10-CM | POA: Insufficient documentation

## 2012-06-27 DIAGNOSIS — Z8742 Personal history of other diseases of the female genital tract: Secondary | ICD-10-CM | POA: Insufficient documentation

## 2012-06-27 DIAGNOSIS — Z862 Personal history of diseases of the blood and blood-forming organs and certain disorders involving the immune mechanism: Secondary | ICD-10-CM | POA: Insufficient documentation

## 2012-06-27 DIAGNOSIS — Z8719 Personal history of other diseases of the digestive system: Secondary | ICD-10-CM | POA: Insufficient documentation

## 2012-06-27 DIAGNOSIS — Z90710 Acquired absence of both cervix and uterus: Secondary | ICD-10-CM | POA: Insufficient documentation

## 2012-06-27 DIAGNOSIS — R1110 Vomiting, unspecified: Secondary | ICD-10-CM | POA: Insufficient documentation

## 2012-06-27 DIAGNOSIS — R109 Unspecified abdominal pain: Secondary | ICD-10-CM

## 2012-06-27 DIAGNOSIS — G8929 Other chronic pain: Secondary | ICD-10-CM | POA: Insufficient documentation

## 2012-06-27 LAB — COMPREHENSIVE METABOLIC PANEL
AST: 39 U/L — ABNORMAL HIGH (ref 0–37)
Albumin: 3.5 g/dL (ref 3.5–5.2)
Alkaline Phosphatase: 71 U/L (ref 39–117)
Chloride: 106 mEq/L (ref 96–112)
Creatinine, Ser: 0.73 mg/dL (ref 0.50–1.10)
Potassium: 3.8 mEq/L (ref 3.5–5.1)
Total Bilirubin: 0.2 mg/dL — ABNORMAL LOW (ref 0.3–1.2)

## 2012-06-27 LAB — CBC WITH DIFFERENTIAL/PLATELET
Basophils Absolute: 0 10*3/uL (ref 0.0–0.1)
Eosinophils Relative: 5 % (ref 0–5)
HCT: 36.1 % (ref 36.0–46.0)
Lymphocytes Relative: 36 % (ref 12–46)
MCV: 80.6 fL (ref 78.0–100.0)
Monocytes Absolute: 0.8 10*3/uL (ref 0.1–1.0)
RDW: 12.4 % (ref 11.5–15.5)
WBC: 8.6 10*3/uL (ref 4.0–10.5)

## 2012-06-27 MED ORDER — METOCLOPRAMIDE HCL 10 MG PO TABS: 10.0000 mg | ORAL_TABLET | Freq: Four times a day (QID) | ORAL | Status: DC

## 2012-06-27 MED ORDER — HYDROMORPHONE HCL PF 1 MG/ML IJ SOLN
1.0000 mg | Freq: Once | INTRAMUSCULAR | Status: AC
  Administered 2012-06-27: 1 mg via INTRAVENOUS
  Filled 2012-06-27: qty 1

## 2012-06-27 MED ORDER — DIPHENHYDRAMINE HCL 50 MG/ML IJ SOLN
25.0000 mg | Freq: Once | INTRAMUSCULAR | Status: AC
  Administered 2012-06-27: 25 mg via INTRAVENOUS
  Filled 2012-06-27: qty 1

## 2012-06-27 MED ORDER — ONDANSETRON HCL 4 MG/2ML IJ SOLN
4.0000 mg | Freq: Once | INTRAMUSCULAR | Status: AC
  Administered 2012-06-27: 4 mg via INTRAVENOUS
  Filled 2012-06-27: qty 2

## 2012-06-27 MED ORDER — SODIUM CHLORIDE 0.9 % IV SOLN
1000.0000 mL | INTRAVENOUS | Status: DC
  Administered 2012-06-27: 1000 mL via INTRAVENOUS

## 2012-06-27 MED ORDER — SODIUM CHLORIDE 0.9 % IV SOLN: 1000.0000 mL | Freq: Once | INTRAVENOUS | Status: DC

## 2012-06-27 NOTE — ED Notes (Signed)
Pt states she has been in pain for 3 1/2 weeks was seen at [facility] and states she didn't stay because she really didn't want to miss Christmas with her kids.    Pt is alert and oriented

## 2012-06-27 NOTE — ED Notes (Signed)
Pt states she is scheduled for pancreas surgery January in [facility]

## 2012-06-27 NOTE — ED Provider Notes (Signed)
History    CSN: [id number] Arrival date & time 06/27/12  2038 First MD Initiated Contact with Patient 06/27/12 2112     Chief Complaint  Patient presents with  . Abdominal Pain  . Emesis    Patient is a 31 y.o. female presenting with abdominal pain and vomiting.  Abdominal Pain The primary symptoms of the illness include abdominal pain and vomiting. The primary symptoms of the illness do not include fever, nausea or dysuria. Episode onset: 3 weeks ago. The onset of the illness was gradual.  The abdominal pain is located in the epigastric region. The abdominal pain does not radiate. The abdominal pain is relieved by nothing.  Symptoms associated with the illness do not include chills, frequency or back pain.  Emesis  Associated symptoms include abdominal pain. Pertinent negatives include no chills and no fever.    Past Medical History  Diagnosis Date  . Pancreatitis   . S/P laparoscopic cholecystectomy   . Fibroids   . Ovarian cyst   . Chronic abdominal pain   . Anemia     Past Surgical History  Procedure Date  . Cholecystectomy   . Abdominal hysterectomy   . Dilation and curettage of uterus     Family History  Problem Relation Age of Onset  . Diabetes Mother   . Hypertension Mother   . Hypertension Father   . Diabetes Father   . Asthma Daughter     History  Substance Use Topics  . Smoking status: Current Every Day Smoker -- 0.5 packs/day for 10 years    Types: Cigarettes  . Smokeless tobacco: Never Used     Comment: declined  . Alcohol Use: No    OB History    Grav Para Term Preterm Abortions TAB SAB Ect Mult Living   4 2 1 1 2  2          Review of Systems  Constitutional: Negative for fever and chills.  Gastrointestinal: Positive for vomiting and abdominal pain. Negative for nausea.  Genitourinary: Negative for dysuria and frequency.  Musculoskeletal: Negative for back pain.  All other systems reviewed and are negative.    Allergies  Celecoxib;  Ketorolac tromethamine; Meperidine hcl; Coconut flavor; Zofran; Fentanyl; Fish allergy; Ibuprofen; Morphine; Promethazine; Propoxyphene-acetaminophen; and Tramadol hcl  Home Medications   Current Outpatient Rx  Name  Route  Sig  Dispense  Refill  . ACETAMINOPHEN 500 MG PO TABS   Oral   Take 1,000 mg by mouth every 6 (six) hours as needed.         Marland Kitchen DIPHENHYDRAMINE HCL 12.5 MG/5ML PO LIQD   Oral   Take 50 mg by mouth 3 (three) times daily as needed. For itching         . METOCLOPRAMIDE HCL 10 MG PO TABS   Oral   Take 1 tablet (10 mg total) by mouth every 6 (six) hours.   15 tablet   0     BP 137/91  Pulse 86  Temp 98.7 F (37.1 C) (Oral)  Resp 20  Ht 5\' 5"  (1.651 m)  SpO2 100%  LMP 02/04/2012  Physical Exam  Nursing note and vitals reviewed. Constitutional: She appears well-developed and well-nourished. No distress.  HENT:  Head: Normocephalic and atraumatic.  Right Ear: External ear normal.  Left Ear: External ear normal.  Eyes: Conjunctivae normal are normal. Right eye exhibits no discharge. Left eye exhibits no discharge. No scleral icterus.  Neck: Neck supple. No tracheal deviation present.  Cardiovascular: Normal  rate, regular rhythm and intact distal pulses.   Pulmonary/Chest: Effort normal and breath sounds normal. No stridor. No respiratory distress. She has no wheezes. She has no rales.  Abdominal: Soft. Bowel sounds are normal. She exhibits no distension. There is tenderness in the epigastric area. There is no rigidity, no rebound, no guarding and no CVA tenderness. No hernia.  Musculoskeletal: She exhibits no edema and no tenderness.  Neurological: She is alert. She has normal strength. No sensory deficit. Cranial nerve deficit:  no gross defecits noted. She exhibits normal muscle tone. She displays no seizure activity. Coordination normal.  Skin: Skin is warm and dry. No rash noted.  Psychiatric: She has a normal mood and affect.    ED Course   Procedures (including critical care time)  Labs Reviewed - No data to display Dg Abd Acute W/chest  06/25/2012  *RADIOLOGY REPORT*  Clinical Data: Nausea and vomiting.  ACUTE ABDOMEN SERIES (ABDOMEN 2 VIEW & CHEST 1 VIEW)  Comparison: CT abdomen and pelvis 12/15/2011 and chest and two views abdomen 09/02/2011.  Findings: Lungs are clear.  Heart size is normal.  No pneumothorax or pleural effusion.  Two views of the abdomen show no free intraperitoneal air.  Bowel gas pattern is normal.  Cholecystectomy clips noted.  No bony abnormality is identified.  IMPRESSION: Negative exam.   Original Report Authenticated By: Holley Dexter, M.D.      1. Abdominal pain       MDM  Abdominal pain Pt has been seen numerous times in the past for abdominal pain.  Previous GI records reviewed.  Questionable history of pancreatitis.  She has been referred to [facility] for second opinion.  Her GI doctor did not recommend narcotic medications.  Pt is well hydrated here.  Doubt obstruction, acute infection.  Recc follow up with her PCP and GI doctor.  Considering her frequent narcotic use and the fact that her GI doctor will not rx opiates, will have her continue her tylenol regimen.       Celene Kras, MD 06/27/12 [phone]

## 2012-06-28 DIAGNOSIS — Z862 Personal history of diseases of the blood and blood-forming organs and certain disorders involving the immune mechanism: Secondary | ICD-10-CM | POA: Insufficient documentation

## 2012-06-28 DIAGNOSIS — R1012 Left upper quadrant pain: Secondary | ICD-10-CM | POA: Insufficient documentation

## 2012-06-28 DIAGNOSIS — G8929 Other chronic pain: Secondary | ICD-10-CM | POA: Insufficient documentation

## 2012-06-28 DIAGNOSIS — Z79899 Other long term (current) drug therapy: Secondary | ICD-10-CM | POA: Insufficient documentation

## 2012-06-28 DIAGNOSIS — F17200 Nicotine dependence, unspecified, uncomplicated: Secondary | ICD-10-CM | POA: Insufficient documentation

## 2012-06-28 DIAGNOSIS — Z90710 Acquired absence of both cervix and uterus: Secondary | ICD-10-CM | POA: Insufficient documentation

## 2012-06-28 DIAGNOSIS — R55 Syncope and collapse: Secondary | ICD-10-CM | POA: Insufficient documentation

## 2012-06-28 DIAGNOSIS — K859 Acute pancreatitis without necrosis or infection, unspecified: Secondary | ICD-10-CM | POA: Insufficient documentation

## 2012-06-28 DIAGNOSIS — Z8719 Personal history of other diseases of the digestive system: Secondary | ICD-10-CM | POA: Insufficient documentation

## 2012-06-28 DIAGNOSIS — Z8742 Personal history of other diseases of the female genital tract: Secondary | ICD-10-CM | POA: Insufficient documentation

## 2012-06-28 DIAGNOSIS — Z9089 Acquired absence of other organs: Secondary | ICD-10-CM | POA: Insufficient documentation

## 2012-06-28 DIAGNOSIS — R112 Nausea with vomiting, unspecified: Secondary | ICD-10-CM | POA: Insufficient documentation

## 2012-06-28 NOTE — ED Notes (Signed)
Per EMS-pt treated and released from [facility] yesterday and diagnosed with pancreatitis-states LUQ abd pain and N/V/D have increased since discharged-hx sickle cell

## 2012-06-29 ENCOUNTER — Emergency Department (HOSPITAL_COMMUNITY)
Admission: EM | Admit: 2012-06-29 | Discharge: 2012-06-29 | Disposition: A | Discharge status: Home / Self Care | Payer: Medicaid Other | Attending: Emergency Medicine | Admitting: Emergency Medicine

## 2012-06-29 ENCOUNTER — Encounter (HOSPITAL_COMMUNITY): Payer: Self-pay | Admitting: Emergency Medicine

## 2012-06-29 DIAGNOSIS — R109 Unspecified abdominal pain: Secondary | ICD-10-CM

## 2012-06-29 DIAGNOSIS — R112 Nausea with vomiting, unspecified: Secondary | ICD-10-CM

## 2012-06-29 MED ORDER — SODIUM CHLORIDE 0.9 % IV SOLN
1000.0000 mL | Freq: Once | INTRAVENOUS | Status: AC
  Administered 2012-06-29: 1000 mL via INTRAVENOUS

## 2012-06-29 MED ORDER — DIPHENHYDRAMINE HCL 50 MG/ML IJ SOLN
50.0000 mg | Freq: Once | INTRAMUSCULAR | Status: AC
  Administered 2012-06-29: 50 mg via INTRAVENOUS
  Filled 2012-06-29: qty 1

## 2012-06-29 MED ORDER — METOCLOPRAMIDE HCL 5 MG/ML IJ SOLN
10.0000 mg | Freq: Once | INTRAMUSCULAR | Status: AC
  Administered 2012-06-29: 10 mg via INTRAVENOUS
  Filled 2012-06-29: qty 2

## 2012-06-29 MED ORDER — SODIUM CHLORIDE 0.9 % IV SOLN: 1000.0000 mL | INTRAVENOUS | Status: DC

## 2012-06-29 MED ORDER — SODIUM CHLORIDE 0.9 % IV SOLN: 1000.0000 mL | Freq: Once | INTRAVENOUS | Status: DC

## 2012-06-29 NOTE — ED Notes (Signed)
Spoke with EDP about drawing labs with pt labs being drawn last night, stated she did not need labs.  Pt requesting pain medication.  EDP advised pt that we are not able to treat her chronic pain and that will work to treat her nausea.  Pt requesting to speak with Consulting civil engineer.

## 2012-06-29 NOTE — ED Notes (Signed)
Pt states she is on a clear liquid diet at home and is unable to keep down broth.  Pt states she is taking Benadryl Phenergan and Tylenol at home, but this is not helping.  Pt states n/v/d and pain are increasing.

## 2012-06-29 NOTE — ED Notes (Signed)
EAV:[id number]> Expected date:<BR> Expected time:<BR> Means of arrival:<BR> Comments:<BR> EMS/LUQ pain associated with nausea and vomiting

## 2012-06-29 NOTE — ED Provider Notes (Signed)
History     CSN: [id number]  Arrival date & time 06/28/12  2356   First MD Initiated Contact with Patient 06/29/12 0100      Chief Complaint  Patient presents with  . Abdominal Pain    (Consider location/radiation/quality/duration/timing/severity/associated sxs/prior treatment) Patient is a 31 y.o. female presenting with abdominal pain.  Abdominal Pain The primary symptoms of the illness include abdominal pain.    Patient has chronic abdominal pain and has been a frequent ED visits are for many years. She currently has had 40 visits in the past 6 months. Patient relates she's been seen in the ED yesterday. She relates she wasn't much better when she was discharged and her pain got worse when she got home. She states she's vomited about 10 times today and has had about 12 loose stools today. She has her usual chronic upper abdominal pain. Patient has been seen in the ED on December 19, December 20, December 22, December 23, and December 24. She's had multiple CT scans of her abdomen the last was June 2013 which showed her pancreas to be normal. Patient reports she had a recent colonoscopy done by Dr. Rhea Belton of  [facility] and had a polyp removed. She also states she has been referred to a gastro[facility]ologist at [facility]. Patient has not been receiving narcotics in the ED for almost one year. She reports only thing that controls her nausea is Reglan. She states she takes it at home. She states she's allergic to Zofran and Phenergan.  PCP Dr. Logan Bores of [facility]  Past Medical History  Diagnosis Date  . Pancreatitis   . S/P laparoscopic cholecystectomy   . Fibroids   . Ovarian cyst   . Chronic abdominal pain   . Anemia     Past Surgical History  Procedure Date  . Cholecystectomy   . Abdominal hysterectomy   . Dilation and curettage of uterus     Family History  Problem Relation Age of Onset  . Diabetes Mother   . Hypertension Mother   . Hypertension  Father   . Diabetes Father   . Asthma Daughter     History  Substance Use Topics  . Smoking status: Current Every Day Smoker -- 0.5 packs/day for 10 years    Types: Cigarettes  . Smokeless tobacco: Never Used     Comment: declined  . Alcohol Use: No   employed as in home health aide  OB History    Grav Para Term Preterm Abortions TAB SAB Ect Mult Living   4 2 1 1 2  2          Review of Systems  Gastrointestinal: Positive for abdominal pain.  All other systems reviewed and are negative.    Allergies  Celecoxib; Ketorolac tromethamine; Meperidine hcl; Coconut flavor; Zofran; Fentanyl; Fish allergy; Ibuprofen; Morphine; Promethazine; Propoxyphene-acetaminophen; and Tramadol hcl  Home Medications   Current Outpatient Rx  Name  Route  Sig  Dispense  Refill  . ACETAMINOPHEN 500 MG PO TABS   Oral   Take 1,000 mg by mouth every 6 (six) hours as needed.         Marland Kitchen DIPHENHYDRAMINE HCL 12.5 MG/5ML PO LIQD   Oral   Take 50 mg by mouth 3 (three) times daily as needed. For itching         . METOCLOPRAMIDE HCL 10 MG PO TABS   Oral   Take 1 tablet (10 mg total) by mouth every 6 (six) hours.  15 tablet   0     BP 125/84  Pulse 82  Temp 98.3 F (36.8 C) (Oral)  Resp 16  SpO2 100%  LMP 02/04/2012  Vital signs normal    Physical Exam  Nursing note and vitals reviewed. Constitutional: She is oriented to person, place, and time. She appears well-developed and well-nourished.  Non-toxic appearance. She does not appear ill. No distress.  HENT:  Head: Normocephalic and atraumatic.  Right Ear: External ear normal.  Left Ear: External ear normal.  Nose: Nose normal. No mucosal edema or rhinorrhea.  Mouth/Throat: Oropharynx is clear and moist and mucous membranes are normal. No dental abscesses or uvula swelling.  Eyes: Conjunctivae normal and EOM are normal. Pupils are equal, round, and reactive to light.  Neck: Normal range of motion and full passive range of motion  without pain. Neck supple.  Cardiovascular: Normal rate, regular rhythm and normal heart sounds.  Exam reveals no gallop and no friction rub.   No murmur heard. Pulmonary/Chest: Effort normal and breath sounds normal. No respiratory distress. She has no wheezes. She has no rhonchi. She has no rales. She exhibits no tenderness and no crepitus.  Abdominal: Soft. Normal appearance and bowel sounds are normal. She exhibits no distension. There is tenderness. There is no rebound and no guarding.    Musculoskeletal: Normal range of motion. She exhibits no edema and no tenderness.       Moves all extremities well.   Neurological: She is alert and oriented to person, place, and time. She has normal strength. No cranial nerve deficit.  Skin: Skin is warm, dry and intact. No rash noted. No erythema. No pallor.  Psychiatric: She has a normal mood and affect. Her speech is normal and behavior is normal. Her mood appears not anxious.    ED Course  Procedures (including critical care time)   Medications  0.9 %  sodium chloride infusion (1000 mL Intravenous New Bag/Given 06/29/12 0314)    Followed by  0.9 %  sodium chloride infusion (not administered)    Followed by  0.9 %  sodium chloride infusion (not administered)  metoCLOPramide (REGLAN) injection 10 mg (10 mg Intravenous Given 06/29/12 0314)  diphenhydrAMINE (BENADRYL) injection 50 mg (50 mg Intravenous Given 06/29/12 0314)    Patient has had blood work done the past several days. I did not repeat them tonight.  04:30 AM patient reports she is ready to go home     1. Nausea and vomiting   2. Chronic abdominal pain     Plan discharge  Devoria Albe, MD, FACEP   MDM           Ward Givens, MD 06/29/12 [phone]

## 2012-06-29 NOTE — ED Notes (Signed)
IV team paged to start IV. Called back and stated she will be here shortly.

## 2012-07-03 ENCOUNTER — Encounter (INDEPENDENT_AMBULATORY_CARE_PROVIDER_SITE_OTHER): Payer: Self-pay | Admitting: General Surgery

## 2012-07-04 ENCOUNTER — Emergency Department (HOSPITAL_COMMUNITY)
Admission: EM | Admit: 2012-07-04 | Discharge: 2012-07-04 | Disposition: A | Discharge status: Home / Self Care | Payer: Medicaid Other | Attending: Emergency Medicine | Admitting: Emergency Medicine

## 2012-07-04 ENCOUNTER — Encounter (HOSPITAL_COMMUNITY): Payer: Self-pay | Admitting: Emergency Medicine

## 2012-07-04 DIAGNOSIS — G8929 Other chronic pain: Secondary | ICD-10-CM | POA: Insufficient documentation

## 2012-07-04 DIAGNOSIS — Z90710 Acquired absence of both cervix and uterus: Secondary | ICD-10-CM | POA: Insufficient documentation

## 2012-07-04 DIAGNOSIS — F17200 Nicotine dependence, unspecified, uncomplicated: Secondary | ICD-10-CM | POA: Insufficient documentation

## 2012-07-04 DIAGNOSIS — R112 Nausea with vomiting, unspecified: Secondary | ICD-10-CM | POA: Insufficient documentation

## 2012-07-04 DIAGNOSIS — Z8742 Personal history of other diseases of the female genital tract: Secondary | ICD-10-CM | POA: Insufficient documentation

## 2012-07-04 DIAGNOSIS — R109 Unspecified abdominal pain: Secondary | ICD-10-CM | POA: Insufficient documentation

## 2012-07-04 DIAGNOSIS — Z9089 Acquired absence of other organs: Secondary | ICD-10-CM | POA: Insufficient documentation

## 2012-07-04 DIAGNOSIS — Z862 Personal history of diseases of the blood and blood-forming organs and certain disorders involving the immune mechanism: Secondary | ICD-10-CM | POA: Insufficient documentation

## 2012-07-04 DIAGNOSIS — K859 Acute pancreatitis without necrosis or infection, unspecified: Secondary | ICD-10-CM | POA: Insufficient documentation

## 2012-07-04 LAB — CBC WITH DIFFERENTIAL/PLATELET
HCT: 36 % (ref 36.0–46.0)
Hemoglobin: 12.4 g/dL (ref 12.0–15.0)
Lymphocytes Relative: 36 % (ref 12–46)
Lymphs Abs: 3.7 10*3/uL (ref 0.7–4.0)
MCHC: 34.4 g/dL (ref 30.0–36.0)
Monocytes Absolute: 0.9 10*3/uL (ref 0.1–1.0)
Monocytes Relative: 9 % (ref 3–12)
Neutro Abs: 5 10*3/uL (ref 1.7–7.7)
RBC: 4.48 MIL/uL (ref 3.87–5.11)
WBC: 10.3 10*3/uL (ref 4.0–10.5)

## 2012-07-04 LAB — LIPASE, BLOOD: Lipase: 96 U/L — ABNORMAL HIGH (ref 11–59)

## 2012-07-04 LAB — COMPREHENSIVE METABOLIC PANEL
Alkaline Phosphatase: 66 U/L (ref 39–117)
BUN: 14 mg/dL (ref 6–23)
CO2: 23 mEq/L (ref 19–32)
Chloride: 105 mEq/L (ref 96–112)
Creatinine, Ser: 0.64 mg/dL (ref 0.50–1.10)
GFR calc non Af Amer: >90 mL/min (ref >90)
Glucose, Bld: 98 mg/dL (ref 70–99)
Total Bilirubin: 0.3 mg/dL (ref 0.3–1.2)

## 2012-07-04 MED ORDER — DIPHENHYDRAMINE HCL 50 MG/ML IJ SOLN
25.0000 mg | Freq: Once | INTRAMUSCULAR | Status: AC
  Administered 2012-07-04: 25 mg via INTRAMUSCULAR
  Filled 2012-07-04: qty 1

## 2012-07-04 MED ORDER — HYDROMORPHONE HCL PF 1 MG/ML IJ SOLN
1.0000 mg | Freq: Once | INTRAMUSCULAR | Status: AC
  Administered 2012-07-04: 1 mg via INTRAMUSCULAR
  Filled 2012-07-04: qty 1

## 2012-07-04 NOTE — ED Notes (Signed)
PT. REPORTS ABDOMINAL PAIN WITH VOMITTING AND DIARRHEA FOR SEVERAL MONTHS .

## 2012-07-04 NOTE — ED Provider Notes (Signed)
History     CSN: [id number]  Arrival date & time 07/04/12  7829   First MD Initiated Contact with Patient 07/04/12 0424      Chief Complaint  Patient presents with  . Abdominal Pain    (Consider location/radiation/quality/duration/timing/severity/associated sxs/prior treatment) Patient is a 31 y.o. female presenting with abdominal pain. The history is provided by the patient.  Abdominal Pain The primary symptoms of the illness include abdominal pain, nausea and vomiting. The current episode started more than 2 days ago. The onset of the illness was gradual. The problem has not changed since onset. The abdominal pain began more than 2 days ago. The pain came on gradually. The abdominal pain has been unchanged since its onset. The abdominal pain is located in the epigastric region. The abdominal pain does not radiate. The severity of the abdominal pain is 7/10. The abdominal pain is relieved by nothing. The abdominal pain is exacerbated by vomiting.  Associated medical issues comments: chronic pancreatitis.    Past Medical History  Diagnosis Date  . Pancreatitis   . S/P laparoscopic cholecystectomy   . Fibroids   . Ovarian cyst   . Chronic abdominal pain   . Anemia     Past Surgical History  Procedure Date  . Cholecystectomy   . Abdominal hysterectomy   . Dilation and curettage of uterus     Family History  Problem Relation Age of Onset  . Diabetes Mother   . Hypertension Mother   . Hypertension Father   . Diabetes Father   . Asthma Daughter     History  Substance Use Topics  . Smoking status: Current Every Day Smoker -- 0.5 packs/day for 10 years    Types: Cigarettes  . Smokeless tobacco: Never Used     Comment: declined  . Alcohol Use: No    OB History    Grav Para Term Preterm Abortions TAB SAB Ect Mult Living   4 2 1 1 2  2          Review of Systems  Gastrointestinal: Positive for nausea, vomiting and abdominal pain.  All other systems reviewed and  are negative.    Allergies  Celecoxib; Ketorolac tromethamine; Meperidine hcl; Coconut flavor; Zofran; Fentanyl; Fish allergy; Ibuprofen; Morphine; Promethazine; Propoxyphene-acetaminophen; and Tramadol hcl  Home Medications   Current Outpatient Rx  Name  Route  Sig  Dispense  Refill  . ACETAMINOPHEN 500 MG PO TABS   Oral   Take 1,000 mg by mouth every 6 (six) hours as needed.         Marland Kitchen DIPHENHYDRAMINE HCL 12.5 MG/5ML PO LIQD   Oral   Take 50 mg by mouth 3 (three) times daily as needed. For itching         . METOCLOPRAMIDE HCL 10 MG PO TABS   Oral   Take 1 tablet (10 mg total) by mouth every 6 (six) hours.   15 tablet   0     BP 133/83  Pulse 80  Temp 98.3 F (36.8 C) (Oral)  Resp 14  SpO2 99%  LMP 02/04/2012  Physical Exam  Constitutional: She is oriented to person, place, and time. She appears well-developed and well-nourished.  HENT:  Head: Normocephalic and atraumatic.  Eyes: Conjunctivae normal and EOM are normal. Pupils are equal, round, and reactive to light.  Neck: Normal range of motion.  Cardiovascular: Normal rate, regular rhythm and normal heart sounds.   Pulmonary/Chest: Effort normal and breath sounds normal.  Abdominal: Soft. Bowel  sounds are normal. There is tenderness.    Musculoskeletal: Normal range of motion.  Neurological: She is alert and oriented to person, place, and time.  Skin: Skin is warm and dry.  Psychiatric: She has a normal mood and affect. Her behavior is normal.    ED Course  Procedures (including critical care time)   Labs Reviewed  CBC WITH DIFFERENTIAL  COMPREHENSIVE METABOLIC PANEL  LIPASE, BLOOD   No results found.   No diagnosis found.    MDM  + chronic abd pain.  Will labs,  analgesia,  reassess        Rosanne Ashing, MD 07/04/12 [phone]

## 2012-07-05 ENCOUNTER — Emergency Department (HOSPITAL_COMMUNITY)
Admission: EM | Admit: 2012-07-05 | Discharge: 2012-07-05 | Discharge status: Against Medical Advice | Payer: Medicaid Other | Attending: Emergency Medicine | Admitting: Emergency Medicine

## 2012-07-05 DIAGNOSIS — Z862 Personal history of diseases of the blood and blood-forming organs and certain disorders involving the immune mechanism: Secondary | ICD-10-CM | POA: Insufficient documentation

## 2012-07-05 DIAGNOSIS — Z8742 Personal history of other diseases of the female genital tract: Secondary | ICD-10-CM | POA: Insufficient documentation

## 2012-07-05 DIAGNOSIS — R1013 Epigastric pain: Secondary | ICD-10-CM | POA: Insufficient documentation

## 2012-07-05 DIAGNOSIS — G8929 Other chronic pain: Secondary | ICD-10-CM | POA: Insufficient documentation

## 2012-07-05 DIAGNOSIS — Z8719 Personal history of other diseases of the digestive system: Secondary | ICD-10-CM | POA: Insufficient documentation

## 2012-07-05 DIAGNOSIS — Z9089 Acquired absence of other organs: Secondary | ICD-10-CM | POA: Insufficient documentation

## 2012-07-05 DIAGNOSIS — F17200 Nicotine dependence, unspecified, uncomplicated: Secondary | ICD-10-CM | POA: Insufficient documentation

## 2012-07-05 DIAGNOSIS — Z79899 Other long term (current) drug therapy: Secondary | ICD-10-CM | POA: Insufficient documentation

## 2012-07-05 MED ORDER — METOCLOPRAMIDE HCL 5 MG/ML IJ SOLN
10.0000 mg | Freq: Once | INTRAMUSCULAR | Status: DC
  Filled 2012-07-05: qty 2

## 2012-07-05 NOTE — ED Notes (Signed)
Pt states she does not want the medication and that she will follow up with her MD in the morning. RN tried to talk to pt to stay for tx however she refuses.

## 2012-07-05 NOTE — ED Provider Notes (Signed)
History     CSN: [id number]  Arrival date & time 07/05/12  2153   First MD Initiated Contact with Patient 07/05/12 2252      Chief Complaint  Patient presents with  . Abdominal Pain    (Consider location/radiation/quality/duration/timing/severity/associated sxs/prior treatment) HPI HX provided by PT. Chronic ABD pain presenting with her typical chronic pain with N/V, states unable to hold anything down today, no blood in emesis, no F/C, location epigastric, sharp in quality, no radiation of pain, no sick contacts, no trauma, unable to take medications at home. PT has multiple drug allergies including NSAIDs - toradol, tramadol, zofran, phenergan and morphine, although she has no allergy to dilaudid. Past Medical History  Diagnosis Date  . Pancreatitis   . S/P laparoscopic cholecystectomy   . Fibroids   . Ovarian cyst   . Chronic abdominal pain   . Anemia     Past Surgical History  Procedure Date  . Cholecystectomy   . Abdominal hysterectomy   . Dilation and curettage of uterus     Family History  Problem Relation Age of Onset  . Diabetes Mother   . Hypertension Mother   . Hypertension Father   . Diabetes Father   . Asthma Daughter     History  Substance Use Topics  . Smoking status: Current Every Day Smoker -- 0.5 packs/day for 10 years    Types: Cigarettes  . Smokeless tobacco: Never Used     Comment: declined  . Alcohol Use: No    OB History    Grav Para Term Preterm Abortions TAB SAB Ect Mult Living   4 2 1 1 2  2          Review of Systems  Constitutional: Negative for fever and chills.  HENT: Negative for neck pain and neck stiffness.   Eyes: Negative for pain.  Respiratory: Negative for shortness of breath.   Cardiovascular: Negative for chest pain.  Gastrointestinal: Positive for abdominal pain. Negative for diarrhea and blood in stool.  Genitourinary: Negative for dysuria.  Musculoskeletal: Negative for back pain.  Skin: Negative for rash.    Neurological: Negative for headaches.  All other systems reviewed and are negative.    Allergies  Celecoxib; Ketorolac tromethamine; Meperidine hcl; Coconut flavor; Food; Ketorolac; Meperidine; Ondansetron; Tramadol; Zofran; Fentanyl; Fish allergy; Ibuprofen; Morphine; Promethazine; Propoxyphene-acetaminophen; and Tramadol hcl  Home Medications   Current Outpatient Rx  Name  Route  Sig  Dispense  Refill  . ACETAMINOPHEN 500 MG PO TABS   Oral   Take 1,000 mg by mouth every 6 (six) hours as needed.         Marland Kitchen DIPHENHYDRAMINE HCL 12.5 MG/5ML PO LIQD   Oral   Take 50 mg by mouth 3 (three) times daily as needed. For itching         . METOCLOPRAMIDE HCL 10 MG PO TABS   Oral   Take 1 tablet (10 mg total) by mouth every 6 (six) hours.   15 tablet   0     BP 125/77  Pulse 82  Temp 98.4 F (36.9 C) (Oral)  Resp 16  SpO2 97%  LMP 02/04/2012  Physical Exam  Constitutional: She is oriented to person, place, and time. She appears well-developed and well-nourished.  HENT:  Head: Normocephalic and atraumatic.  Eyes: EOM are normal. Pupils are equal, round, and reactive to light.  Neck: Neck supple.  Cardiovascular: Regular rhythm and intact distal pulses.   Pulmonary/Chest: Effort normal. No respiratory distress.  Abdominal: Soft. Bowel sounds are normal. She exhibits no distension. There is no rebound.       TTP epigastric, neg Murphys sign and no tenderness otherwise.   Musculoskeletal: Normal range of motion. She exhibits no edema.  Neurological: She is alert and oriented to person, place, and time.  Skin: Skin is warm and dry.    ED Course  Procedures (including critical care time)  PT is well known to me, and I offered her IM reglan and PO challenge.  PT states if she can not have IV dilaudid that she wishes to leave AMA and see her physician in the morning. A/O x 4 and no indication for IVC.   MDM   Epigastric pain - chronic, no emesis in ED, mmm and not  tachycardic with VS WNL. Refused medications and left AMA        Sunnie Nielsen, MD 07/05/12 2322

## 2012-07-05 NOTE — ED Notes (Signed)
Presents with 4 weeks of upper abdominal pain, she reports running out of pain medication and being seen here on the 30th of December. She was not given a RX for pain medicaton at that time.  The pain is upper abdominal described as squeezing and sharp associated with nausea. Lipase was elevated 2 days ago. Has appointment with surgeon on the 31st.

## 2012-07-05 NOTE — ED Notes (Addendum)
Pt refusing medication at this time. MD made aware.

## 2012-07-11 ENCOUNTER — Encounter (INDEPENDENT_AMBULATORY_CARE_PROVIDER_SITE_OTHER): Payer: Self-pay | Admitting: General Surgery

## 2012-07-12 ENCOUNTER — Encounter (HOSPITAL_COMMUNITY): Payer: Self-pay | Admitting: *Deleted

## 2012-07-12 ENCOUNTER — Emergency Department (HOSPITAL_COMMUNITY)
Admission: EM | Admit: 2012-07-12 | Discharge: 2012-07-12 | Disposition: A | Discharge status: Home / Self Care | Payer: Medicaid Other | Attending: Emergency Medicine | Admitting: Emergency Medicine

## 2012-07-12 ENCOUNTER — Emergency Department (HOSPITAL_COMMUNITY)
Admission: EM | Admit: 2012-07-12 | Discharge: 2012-07-13 | Disposition: A | Discharge status: Home / Self Care | Payer: Medicaid Other | Source: Home / Self Care | Attending: Emergency Medicine | Admitting: Emergency Medicine

## 2012-07-12 DIAGNOSIS — K0889 Other specified disorders of teeth and supporting structures: Secondary | ICD-10-CM

## 2012-07-12 DIAGNOSIS — Z90710 Acquired absence of both cervix and uterus: Secondary | ICD-10-CM | POA: Insufficient documentation

## 2012-07-12 DIAGNOSIS — F17200 Nicotine dependence, unspecified, uncomplicated: Secondary | ICD-10-CM | POA: Insufficient documentation

## 2012-07-12 DIAGNOSIS — R109 Unspecified abdominal pain: Secondary | ICD-10-CM | POA: Insufficient documentation

## 2012-07-12 DIAGNOSIS — G8929 Other chronic pain: Secondary | ICD-10-CM | POA: Insufficient documentation

## 2012-07-12 DIAGNOSIS — R112 Nausea with vomiting, unspecified: Secondary | ICD-10-CM | POA: Insufficient documentation

## 2012-07-12 DIAGNOSIS — Z9089 Acquired absence of other organs: Secondary | ICD-10-CM | POA: Insufficient documentation

## 2012-07-12 DIAGNOSIS — K089 Disorder of teeth and supporting structures, unspecified: Secondary | ICD-10-CM | POA: Insufficient documentation

## 2012-07-12 DIAGNOSIS — Z8719 Personal history of other diseases of the digestive system: Secondary | ICD-10-CM | POA: Insufficient documentation

## 2012-07-12 DIAGNOSIS — Z862 Personal history of diseases of the blood and blood-forming organs and certain disorders involving the immune mechanism: Secondary | ICD-10-CM | POA: Insufficient documentation

## 2012-07-12 DIAGNOSIS — Z8742 Personal history of other diseases of the female genital tract: Secondary | ICD-10-CM | POA: Insufficient documentation

## 2012-07-12 DIAGNOSIS — R1031 Right lower quadrant pain: Secondary | ICD-10-CM | POA: Insufficient documentation

## 2012-07-12 MED ORDER — ACETAMINOPHEN 500 MG PO TABS
1000.0000 mg | ORAL_TABLET | Freq: Once | ORAL | Status: AC
  Administered 2012-07-12: 1000 mg via ORAL
  Filled 2012-07-12: qty 2

## 2012-07-12 MED ORDER — ACETAMINOPHEN 500 MG PO TABS
1000.0000 mg | ORAL_TABLET | Freq: Once | ORAL | Status: DC
  Filled 2012-07-12: qty 2

## 2012-07-12 NOTE — ED Notes (Signed)
Pt reports she hasn't been able to keep food or fluids down.

## 2012-07-12 NOTE — ED Notes (Signed)
See paper chart for downtime charting and triage information.

## 2012-07-12 NOTE — ED Notes (Signed)
Pt c/o of right side dental pain with infection. Pt states she had a tooth pulled on right side-removed in October. Pt has been on antibiotics several times since October for dental infection

## 2012-07-12 NOTE — ED Provider Notes (Signed)
History     CSN: [id number]  Arrival date & time 07/12/12  0049   First MD Initiated Contact with Patient 07/12/12 [phone]      Chief Complaint  Patient presents with  . Dental Pain    (Consider location/radiation/quality/duration/timing/severity/associated sxs/prior treatment) HPI Comments: 32 year old female well-known to the emergency department and to myself for frequent pain visits. She presents with a complaint of right lower rear molar pain. She states that she had a partial extraction of her right rear molar on her lower jaw that was done in the past, since that time she has had persistent pain in that area. She denies swelling of the jaw, fever, chills, difficulty swallowing or talking. She has been taking pain medication as well as antibiotics at home but feels that the pain has not improved.  Patient is a 32 y.o. female presenting with tooth pain. The history is provided by the patient and medical records.  Dental PainPrimary symptoms do not include fever or sore throat.  Additional symptoms do not include: facial swelling and trouble swallowing.    Past Medical History  Diagnosis Date  . Pancreatitis   . S/P laparoscopic cholecystectomy   . Fibroids   . Ovarian cyst   . Chronic abdominal pain   . Anemia     Past Surgical History  Procedure Date  . Cholecystectomy   . Abdominal hysterectomy   . Dilation and curettage of uterus     Family History  Problem Relation Age of Onset  . Diabetes Mother   . Hypertension Mother   . Hypertension Father   . Diabetes Father   . Asthma Daughter     History  Substance Use Topics  . Smoking status: Current Every Day Smoker -- 0.5 packs/day for 10 years    Types: Cigarettes  . Smokeless tobacco: Never Used     Comment: declined  . Alcohol Use: No    OB History    Grav Para Term Preterm Abortions TAB SAB Ect Mult Living   4 2 1 1 2  2          Review of Systems  Constitutional: Negative for fever and chills.    HENT: Positive for dental problem. Negative for sore throat, facial swelling, trouble swallowing and voice change.        Toothache  Gastrointestinal: Negative for nausea and vomiting.    Allergies  Celecoxib; Ketorolac tromethamine; Meperidine hcl; Coconut flavor; Food; Ketorolac; Meperidine; Ondansetron; Tramadol; Zofran; Fentanyl; Fish allergy; Ibuprofen; Morphine; Promethazine; Propoxyphene-acetaminophen; and Tramadol hcl  Home Medications   Current Outpatient Rx  Name  Route  Sig  Dispense  Refill  . ACETAMINOPHEN 500 MG PO TABS   Oral   Take 1,000 mg by mouth every 6 (six) hours as needed.         Marland Kitchen DIPHENHYDRAMINE HCL 12.5 MG/5ML PO LIQD   Oral   Take 50 mg by mouth 3 (three) times daily as needed. For itching         . METOCLOPRAMIDE HCL 10 MG PO TABS   Oral   Take 1 tablet (10 mg total) by mouth every 6 (six) hours.   15 tablet   0     BP 133/85  Pulse 88  Temp 97.9 F (36.6 C) (Oral)  Resp 18  SpO2 98%  LMP 02/04/2012  Physical Exam  Nursing note and vitals reviewed. Constitutional: She appears well-developed and well-nourished. No distress.  HENT:  Head: Normocephalic and atraumatic.  Mouth/Throat: Oropharynx is  clear and moist. No oropharyngeal exudate.       There is no gingival swelling, mild tenderness over the right rear molar area though there is no palpable molar in that spot. She has no tenderness under her tongue, no asymmetry to the jaw, no trismus or torticollis  Eyes: Conjunctivae normal are normal. No scleral icterus.  Neck: Normal range of motion. Neck supple. No thyromegaly present.  Cardiovascular: Normal rate and regular rhythm.   Pulmonary/Chest: Effort normal and breath sounds normal.  Lymphadenopathy:    She has no cervical adenopathy.  Neurological: She is alert.  Skin: Skin is warm and dry. No rash noted. She is not diaphoretic.    ED Course  Procedures (including critical care time)  Labs Reviewed - No data to display No  results found.   1. Toothache       MDM  Well-appearing, no fever, patient has a history of pain seeking and malingering for pain medication, she will be given Tylenol and discharged to followup with her dentist.        Vida Roller, MD 07/12/12 [phone]

## 2012-07-12 NOTE — ED Notes (Signed)
Family members x 3 given sprites to drink.  Pt requesting pain medication and wants to know what kind of medication MD is going to give her.  Informed pt that MD was notified that she could not take Tylenol and that no new orders have been received.  Pt verified earlier with pharmacy that she takes Tylenol at home.

## 2012-07-12 NOTE — ED Provider Notes (Signed)
History     CSN: [id number]  Arrival date & time 07/12/12  2051   None     Chief Complaint  Patient presents with  . Abdominal Pain    (Consider location/radiation/quality/duration/timing/severity/associated sxs/prior treatment) HPI chief complaint: Abdominal pain. Onset: Several hours ago. Location abdomen, right lower quadrant. Not improved or worsened by anything. Quality dol. No radiation. Severity. Timing constant. For signs and symptoms the review of systems. Regarding social history see nurse's notes. I have reviewed patient's past medical, past surgical, social history as well as medications and allergies. No family history of nephrolithiasis.  Past Medical History  Diagnosis Date  . Pancreatitis   . S/P laparoscopic cholecystectomy   . Fibroids   . Ovarian cyst   . Chronic abdominal pain   . Anemia     Past Surgical History  Procedure Date  . Cholecystectomy   . Abdominal hysterectomy   . Dilation and curettage of uterus     Family History  Problem Relation Age of Onset  . Diabetes Mother   . Hypertension Mother   . Hypertension Father   . Diabetes Father   . Asthma Daughter     History  Substance Use Topics  . Smoking status: Current Every Day Smoker -- 0.5 packs/day for 10 years    Types: Cigarettes  . Smokeless tobacco: Never Used     Comment: declined  . Alcohol Use: No    OB History    Grav Para Term Preterm Abortions TAB SAB Ect Mult Living   4 2 1 1 2  2          Review of Systems  Constitutional: Negative for fever and chills.  HENT: Negative for trouble swallowing, neck pain and neck stiffness.   Eyes: Negative for pain, discharge and itching.  Respiratory: Negative for cough, chest tightness and shortness of breath.   Cardiovascular: Negative for chest pain, palpitations and leg swelling.  Gastrointestinal: Positive for nausea, vomiting and abdominal pain. Negative for diarrhea, constipation and blood in stool.  Genitourinary: Negative  for dysuria, urgency, frequency, hematuria, flank pain, decreased urine volume, vaginal bleeding, vaginal discharge, difficulty urinating, vaginal pain and pelvic pain.  Musculoskeletal: Negative for back pain, joint swelling and gait problem.  Skin: Negative for rash and wound.  Neurological: Negative for dizziness, tremors, seizures, syncope, facial asymmetry, speech difficulty, weakness, light-headedness, numbness and headaches.  Hematological: Negative for adenopathy. Does not bruise/bleed easily.  Psychiatric/Behavioral: Negative for confusion and decreased concentration.    Allergies  Celecoxib; Ketorolac tromethamine; Meperidine hcl; Coconut flavor; Food; Ketorolac; Meperidine; Ondansetron; Tramadol; Zofran; Fentanyl; Fish allergy; Ibuprofen; Morphine; Promethazine; Propoxyphene-acetaminophen; and Tramadol hcl  Home Medications   Current Outpatient Rx  Name  Route  Sig  Dispense  Refill  . ACETAMINOPHEN 500 MG PO TABS   Oral   Take 1,000 mg by mouth every 6 (six) hours as needed. For pain         . DIPHENHYDRAMINE HCL 12.5 MG/5ML PO LIQD   Oral   Take 50 mg by mouth 3 (three) times daily as needed. For itching         . METOCLOPRAMIDE HCL 10 MG PO TABS   Oral   Take 1 tablet (10 mg total) by mouth every 6 (six) hours.   15 tablet   0     BP 137/85  Pulse 102  Temp 98.2 F (36.8 C) (Oral)  Resp 22  SpO2 98%  LMP 02/04/2012  Physical Exam  Constitutional: She is oriented  to person, place, and time. She appears well-developed and well-nourished. No distress.  HENT:  Head: Normocephalic and atraumatic.  Eyes: Conjunctivae normal are normal. Right eye exhibits no discharge. Left eye exhibits no discharge. No scleral icterus.  Neck: Normal range of motion. Neck supple.  Cardiovascular: Normal rate, regular rhythm, normal heart sounds and intact distal pulses.   No murmur heard. Pulmonary/Chest: Effort normal and breath sounds normal. No respiratory distress. She  has no wheezes. She has no rales. She exhibits no tenderness.  Abdominal: Soft. Bowel sounds are normal. She exhibits no distension and no mass. There is no hepatosplenomegaly. There is tenderness in the right lower quadrant. There is no rigidity, no rebound, no guarding, no CVA tenderness and negative Murphy's sign. No hernia. Hernia confirmed negative in the ventral area.    Musculoskeletal: Normal range of motion. She exhibits no edema and no tenderness.  Neurological: She is alert and oriented to person, place, and time.  Skin: Skin is warm and dry. She is not diaphoretic.  Psychiatric: She has a normal mood and affect.    ED Course  Procedures (including critical care time)   Labs Reviewed  URINALYSIS, ROUTINE W REFLEX MICROSCOPIC  URINE CULTURE   No results found.   1. Abdominal pain       MDM  Patient is a very well-appearing 32 year old female with over 40 visits this emergency department over the course of the last year presenting with several hours of sudden onset right-sided abdominal pain. Mild nonbilious nonbloody emesis. Hysterectomy and history of ovarian cysts but no history of ovarian torsion. No pelvic pain. GU pathology felt to be doubtful. Given the sudden onset of severe pain reported history is not all consistent with acute appendicitis. Bowel obstruction also felt to be very doubtful at this time. No urinary symptoms but urinalysis pending evaluation for possible hematuria and nephrolithiasis. Prior cholecystectomy so a biliary etiology it is doubtful at this time. They're numerous documented notes concerning for drug-seeking behavior in this patient. Narcotic are awaited at this time. In the patient's had several of visits for identical symptoms with negative imaging results at that time. If urinalysis is negative patient be discharged home with return precautions.  Per nursing report the urinalysis machine and lab is currently nonfunctional at this time. Urine  specimens are being sent to [facility] long period culture sent and is positive patient will be notified by telephone. Return precautions discussed. Patient certainly felt to be stable for discharge home.        Consuello Masse, MD 07/13/12 [phone]

## 2012-07-12 NOTE — ED Notes (Signed)
Pt c/o RLQ abdominal pain that started tonight while she was cooking dinner.  C/o weakness, states pain feels "like something popped, like a pressure released".  C/o n/v

## 2012-07-12 NOTE — ED Notes (Addendum)
Dr. Bebe Shaggy notified that pt states she cannot take Tylenol.  No new orders received.

## 2012-07-12 NOTE — ED Notes (Signed)
Sent Pt's urine down without a label so they were unable to use it. Notified Pt of what happened and asked her to please notify us when she needs to void again.

## 2012-07-13 ENCOUNTER — Encounter (HOSPITAL_COMMUNITY): Payer: Self-pay

## 2012-07-13 ENCOUNTER — Emergency Department (HOSPITAL_COMMUNITY)
Admission: EM | Admit: 2012-07-13 | Discharge: 2012-07-13 | Disposition: A | Discharge status: Home / Self Care | Payer: Medicaid Other | Attending: Emergency Medicine | Admitting: Emergency Medicine

## 2012-07-13 DIAGNOSIS — G8929 Other chronic pain: Secondary | ICD-10-CM | POA: Insufficient documentation

## 2012-07-13 DIAGNOSIS — Z9089 Acquired absence of other organs: Secondary | ICD-10-CM | POA: Insufficient documentation

## 2012-07-13 DIAGNOSIS — F17200 Nicotine dependence, unspecified, uncomplicated: Secondary | ICD-10-CM | POA: Insufficient documentation

## 2012-07-13 DIAGNOSIS — Z791 Long term (current) use of non-steroidal anti-inflammatories (NSAID): Secondary | ICD-10-CM | POA: Insufficient documentation

## 2012-07-13 DIAGNOSIS — R109 Unspecified abdominal pain: Secondary | ICD-10-CM | POA: Insufficient documentation

## 2012-07-13 DIAGNOSIS — R10A0 Flank pain, unspecified side: Secondary | ICD-10-CM

## 2012-07-13 DIAGNOSIS — Z9079 Acquired absence of other genital organ(s): Secondary | ICD-10-CM | POA: Insufficient documentation

## 2012-07-13 DIAGNOSIS — Z862 Personal history of diseases of the blood and blood-forming organs and certain disorders involving the immune mechanism: Secondary | ICD-10-CM | POA: Insufficient documentation

## 2012-07-13 DIAGNOSIS — Z8719 Personal history of other diseases of the digestive system: Secondary | ICD-10-CM | POA: Insufficient documentation

## 2012-07-13 DIAGNOSIS — Z8742 Personal history of other diseases of the female genital tract: Secondary | ICD-10-CM | POA: Insufficient documentation

## 2012-07-13 LAB — URINALYSIS, ROUTINE W REFLEX MICROSCOPIC
Bilirubin Urine: NEGATIVE
Ketones, ur: NEGATIVE mg/dL
Leukocytes, UA: NEGATIVE
Nitrite: NEGATIVE
Protein, ur: NEGATIVE mg/dL

## 2012-07-13 MED ORDER — OXYCODONE-ACETAMINOPHEN 5-325 MG PO TABS
1.0000 | ORAL_TABLET | Freq: Once | ORAL | Status: AC
  Administered 2012-07-13: 1 via ORAL
  Filled 2012-07-13: qty 1

## 2012-07-13 NOTE — ED Provider Notes (Signed)
I have personally seen and examined the patient.  I have discussed the plan of care with the resident.  I have reviewed the documentation on PMH/FH/Soc. History.  I have reviewed the documentation of the resident and agree.  Pt well appearing, no distress, abdomen soft, doubt acute abdominal process  Joya Gaskins, MD 07/13/12 1550

## 2012-07-13 NOTE — Discharge Instructions (Signed)
We saw you in the ER for the abdominal pain. Urine results are normal If your symptoms get worse, return to the ER. Take the pain meds and nausea meds as prescribed.  Abdominal Pain (Nonspecific) Your exam might not show the exact reason you have abdominal pain. Since there are many different causes of abdominal pain, another checkup and more tests may be needed. It is very important to follow up for lasting (persistent) or worsening symptoms. A possible cause of abdominal pain in any person who still has his or her appendix is acute appendicitis. Appendicitis is often hard to diagnose. Normal blood tests, urine tests, ultrasound, and CT scans do not completely rule out early appendicitis or other causes of abdominal pain. Sometimes, only the changes that happen over time will allow appendicitis and other causes of abdominal pain to be determined. Other potential problems that may require surgery may also take time to become more apparent. Because of this, it is important that you follow all of the instructions below. HOME CARE INSTRUCTIONS   Rest as much as possible.  Do not eat solid food until your pain is gone.  While adults or children have pain: A diet of water, weak decaffeinated tea, broth or bouillon, gelatin, oral rehydration solutions (ORS), frozen ice pops, or ice chips may be helpful.  When pain is gone in adults or children: Start a light diet (dry toast, crackers, applesauce, or white rice). Increase the diet slowly as long as it does not bother you. Eat no dairy products (including cheese and eggs) and no spicy, fatty, fried, or high-fiber foods.  Use no alcohol, caffeine, or cigarettes.  Take your regular medicines unless your caregiver told you not to.  Take any prescribed medicine as directed.  Only take over-the-counter or prescription medicines for pain, discomfort, or fever as directed by your caregiver. Do not give aspirin to children. If your caregiver has given you a  follow-up appointment, it is very important to keep that appointment. Not keeping the appointment could result in a permanent injury and/or lasting (chronic) pain and/or disability. If there is any problem keeping the appointment, you must call to reschedule.  SEEK IMMEDIATE MEDICAL CARE IF:   Your pain is not gone in 24 hours.  Your pain becomes worse, changes location, or feels different.  You or your child has an oral temperature above 102 F (38.9 C), not controlled by medicine.  Your baby is older than 3 months with a rectal temperature of 102 F (38.9 C) or higher.  Your baby is 10 months old or younger with a rectal temperature of 100.4 F (38 C) or higher.  You have shaking chills.  You keep throwing up (vomiting) or cannot drink liquids.  There is blood in your vomit or you see blood in your bowel movements.  Your bowel movements become dark or black.  You have frequent bowel movements.  Your bowel movements stop (become blocked) or you cannot pass gas.  You have bloody, frequent, or painful urination.  You have yellow discoloration in the skin or whites of the eyes.  Your stomach becomes bloated or bigger.  You have dizziness or fainting.  You have chest or back pain. MAKE SURE YOU:   Understand these instructions.  Will watch your condition.  Will get help right away if you are not doing well or get worse. Document Released: 06/21/2005 Document Revised: 09/13/2011 Document Reviewed: 05/19/2009 [facility] Patient Information 2013 [place], [place].

## 2012-07-13 NOTE — ED Notes (Signed)
Pt c/o rt sided abd pain; pt seen at [facility] x 2 on 1/8 and states that she is still having pain,

## 2012-07-13 NOTE — ED Notes (Signed)
Pt states she is leaving.  Asked her if she wanted to wait to speak with MD and she did not.

## 2012-07-13 NOTE — ED Notes (Signed)
Since yesterday has had rt sided flank and abdominal pain.  Went to [facility] yesterday and "they didn't do anything".  Pt was sent home.  States pain isn't any better with tylenol at home.

## 2012-07-13 NOTE — ED Provider Notes (Signed)
History     CSN: [id number]  Arrival date & time 07/13/12  0340   First MD Initiated Contact with Patient 07/13/12 0401      Chief Complaint  Patient presents with  . Flank Pain    (Consider location/radiation/quality/duration/timing/severity/associated sxs/prior treatment) HPI Comments: Pt comes in with cc of flank pain. Pt is s/p TAH BSO. Comes in with flank pain, left sided that started y'day while she was cooking in th evening. The pain was sudden and excruciating. She went to the [facility] ER - and after waiting for a few hours -was sent home - but she felt that there was confusion with her workup, and she never got her urine checked - so she decided to come back to the ER. No hx of renal stones, no UTI lke sx. The pain is constant, and moderately severe now. No nausea at this time and no fevers, chills. No trauma, no vaginal discharge.    Patient is a 32 y.o. female presenting with flank pain. The history is provided by the patient and medical records.  Flank Pain Pertinent negatives include no chest pain, no abdominal pain, no headaches and no shortness of breath.    Past Medical History  Diagnosis Date  . Pancreatitis   . S/P laparoscopic cholecystectomy   . Fibroids   . Ovarian cyst   . Chronic abdominal pain   . Anemia     Past Surgical History  Procedure Date  . Cholecystectomy   . Abdominal hysterectomy   . Dilation and curettage of uterus     Family History  Problem Relation Age of Onset  . Diabetes Mother   . Hypertension Mother   . Hypertension Father   . Diabetes Father   . Asthma Daughter     History  Substance Use Topics  . Smoking status: Current Every Day Smoker -- 0.5 packs/day for 10 years    Types: Cigarettes  . Smokeless tobacco: Never Used     Comment: declined  . Alcohol Use: No    OB History    Grav Para Term Preterm Abortions TAB SAB Ect Mult Living   4 2 1 1 2  2          Review of Systems  Constitutional: Negative for activity  change.  HENT: Negative for neck pain.   Respiratory: Negative for shortness of breath.   Cardiovascular: Negative for chest pain.  Gastrointestinal: Negative for nausea, vomiting and abdominal pain.  Genitourinary: Positive for flank pain. Negative for hematuria, vaginal bleeding, vaginal discharge and menstrual problem.  Neurological: Negative for headaches.    Allergies  Celecoxib; Ketorolac tromethamine; Meperidine hcl; Coconut flavor; Food; Ketorolac; Meperidine; Ondansetron; Tramadol; Zofran; Fentanyl; Fish allergy; Ibuprofen; Morphine; Promethazine; Propoxyphene-acetaminophen; and Tramadol hcl  Home Medications   Current Outpatient Rx  Name  Route  Sig  Dispense  Refill  . ACETAMINOPHEN 500 MG PO TABS   Oral   Take 1,000 mg by mouth every 6 (six) hours as needed. For pain         . METOCLOPRAMIDE HCL 10 MG PO TABS   Oral   Take 1 tablet (10 mg total) by mouth every 6 (six) hours.   15 tablet   0     BP 130/78  Pulse 82  Temp 97.9 F (36.6 C) (Oral)  Resp 18  SpO2 100%  LMP 02/04/2012  Physical Exam  Nursing note and vitals reviewed. Constitutional: She is oriented to person, place, and time. She appears well-developed and  well-nourished.  HENT:  Head: Normocephalic and atraumatic.  Eyes: EOM are normal. Pupils are equal, round, and reactive to light.  Neck: Neck supple.  Cardiovascular: Normal rate, regular rhythm and normal heart sounds.   No murmur heard. Pulmonary/Chest: Effort normal. No respiratory distress.  Abdominal: Soft. She exhibits no distension. There is no tenderness. There is no rebound and no guarding.  Neurological: She is alert and oriented to person, place, and time.  Skin: Skin is warm and dry.    ED Course  Procedures (including critical care time)  Labs Reviewed  URINALYSIS, ROUTINE W REFLEX MICROSCOPIC - Abnormal; Notable for the following:    APPearance CLOUDY (*)     All other components within normal limits   No results  found.   1. Flank pain       MDM  Pt comes in with cc of flank pain. Sudden onset last evening. Her pain is improved, but still present. She has no vaginal d/c, bleeding and is s/p TAH BSO, on hormones - so no concerns for any life threatening female GU etiology. Pt is not a vasculopath. Pt has no UTI like sx, and although the pain is in the flank region  - the hx is not suggestive of renal stone. UA is pending at this time - will f/u. Likely discharge.    Derwood Kaplan, MD 07/13/12 [phone]

## 2012-07-13 NOTE — ED Notes (Signed)
Pt's boyfriend asked to speak to MD.  Dr. Bebe Shaggy notified of same.

## 2012-07-14 ENCOUNTER — Emergency Department (HOSPITAL_COMMUNITY)
Admission: EM | Admit: 2012-07-14 | Discharge: 2012-07-14 | Disposition: A | Discharge status: Home / Self Care | Payer: Medicaid Other | Attending: Emergency Medicine | Admitting: Emergency Medicine

## 2012-07-14 ENCOUNTER — Encounter (HOSPITAL_COMMUNITY): Payer: Self-pay | Admitting: Family Medicine

## 2012-07-14 DIAGNOSIS — G8929 Other chronic pain: Secondary | ICD-10-CM | POA: Insufficient documentation

## 2012-07-14 DIAGNOSIS — F17200 Nicotine dependence, unspecified, uncomplicated: Secondary | ICD-10-CM | POA: Insufficient documentation

## 2012-07-14 DIAGNOSIS — R109 Unspecified abdominal pain: Secondary | ICD-10-CM | POA: Insufficient documentation

## 2012-07-14 DIAGNOSIS — Z79899 Other long term (current) drug therapy: Secondary | ICD-10-CM | POA: Insufficient documentation

## 2012-07-14 DIAGNOSIS — Z9089 Acquired absence of other organs: Secondary | ICD-10-CM | POA: Insufficient documentation

## 2012-07-14 DIAGNOSIS — Z862 Personal history of diseases of the blood and blood-forming organs and certain disorders involving the immune mechanism: Secondary | ICD-10-CM | POA: Insufficient documentation

## 2012-07-14 DIAGNOSIS — Z90710 Acquired absence of both cervix and uterus: Secondary | ICD-10-CM | POA: Insufficient documentation

## 2012-07-14 DIAGNOSIS — Z8742 Personal history of other diseases of the female genital tract: Secondary | ICD-10-CM | POA: Insufficient documentation

## 2012-07-14 DIAGNOSIS — Z8719 Personal history of other diseases of the digestive system: Secondary | ICD-10-CM | POA: Insufficient documentation

## 2012-07-14 LAB — COMPREHENSIVE METABOLIC PANEL
AST: 25 U/L (ref 0–37)
Albumin: 3.9 g/dL (ref 3.5–5.2)
BUN: 13 mg/dL (ref 6–23)
Calcium: 9.7 mg/dL (ref 8.4–10.5)
Chloride: 103 mEq/L (ref 96–112)
Creatinine, Ser: 0.63 mg/dL (ref 0.50–1.10)
Total Bilirubin: 0.3 mg/dL (ref 0.3–1.2)
Total Protein: 8.2 g/dL (ref 6.0–8.3)

## 2012-07-14 LAB — CBC WITH DIFFERENTIAL/PLATELET
Basophils Absolute: 0.1 10*3/uL (ref 0.0–0.1)
Basophils Relative: 1 % (ref 0–1)
Eosinophils Absolute: 0.4 10*3/uL (ref 0.0–0.7)
HCT: 40.1 % (ref 36.0–46.0)
MCH: 27.3 pg (ref 26.0–34.0)
MCHC: 33.4 g/dL (ref 30.0–36.0)
Monocytes Absolute: 0.7 10*3/uL (ref 0.1–1.0)
Monocytes Relative: 8 % (ref 3–12)
Neutro Abs: 5.3 10*3/uL (ref 1.7–7.7)
Neutrophils Relative %: 58 % (ref 43–77)
RDW: 12.2 % (ref 11.5–15.5)

## 2012-07-14 LAB — LIPASE, BLOOD: Lipase: 53 U/L (ref 11–59)

## 2012-07-14 MED ORDER — OXYCODONE-ACETAMINOPHEN 5-325 MG PO TABS
1.0000 | ORAL_TABLET | Freq: Once | ORAL | Status: AC
  Administered 2012-07-14: 1 via ORAL
  Filled 2012-07-14: qty 1

## 2012-07-14 NOTE — ED Provider Notes (Addendum)
History     CSN: [id number]  Arrival date & time 07/14/12  4098   First MD Initiated Contact with Patient 07/14/12 1909      Chief Complaint  Patient presents with  . Abdominal Pain    (Consider location/radiation/quality/duration/timing/severity/associated sxs/prior treatment) Patient is a 32 y.o. female presenting with abdominal pain. The history is provided by the patient.  Abdominal Pain The primary symptoms of the illness include abdominal pain. The primary symptoms of the illness do not include fever, shortness of breath, vomiting, diarrhea, dysuria, vaginal discharge or vaginal bleeding.  Symptoms associated with the illness do not include chills, constipation or back pain.  pt with chronic, recurrent abd pain returns to ed c/o abd pain. Mid to right abd. Constant. Dull. Moderate. Non radiating. No specific exacerbating or alleviating factors. No fever or chills. No nv. Normal appetite. Had normal bm today. No abd distension. Prior gb surgery. No dysuria or gu c/o. No vaginal discharge or bleeding.     Past Medical History  Diagnosis Date  . Pancreatitis   . S/P laparoscopic cholecystectomy   . Fibroids   . Ovarian cyst   . Chronic abdominal pain   . Anemia     Past Surgical History  Procedure Date  . Cholecystectomy   . Abdominal hysterectomy   . Dilation and curettage of uterus     Family History  Problem Relation Age of Onset  . Diabetes Mother   . Hypertension Mother   . Hypertension Father   . Diabetes Father   . Asthma Daughter     History  Substance Use Topics  . Smoking status: Current Every Day Smoker -- 0.5 packs/day for 10 years    Types: Cigarettes  . Smokeless tobacco: Never Used     Comment: declined  . Alcohol Use: No    OB History    Grav Para Term Preterm Abortions TAB SAB Ect Mult Living   4 2 1 1 2  2          Review of Systems  Constitutional: Negative for fever and chills.  HENT: Negative for neck pain.   Eyes: Negative for  redness.  Respiratory: Negative for shortness of breath.   Cardiovascular: Negative for chest pain.  Gastrointestinal: Positive for abdominal pain. Negative for vomiting, diarrhea and constipation.  Genitourinary: Negative for dysuria, flank pain, vaginal bleeding and vaginal discharge.  Musculoskeletal: Negative for back pain.  Skin: Negative for rash.  Neurological: Negative for headaches.  Hematological: Does not bruise/bleed easily.  Psychiatric/Behavioral: Negative for confusion.    Allergies  Celecoxib; Ketorolac tromethamine; Meperidine hcl; Coconut flavor; Food; Ketorolac; Meperidine; Ondansetron; Tramadol; Zofran; Fentanyl; Fish allergy; Ibuprofen; Morphine; Promethazine; Propoxyphene-acetaminophen; and Tramadol hcl  Home Medications   Current Outpatient Rx  Name  Route  Sig  Dispense  Refill  . ACETAMINOPHEN 500 MG PO TABS   Oral   Take 1,000 mg by mouth every 6 (six) hours as needed. For pain         . METOCLOPRAMIDE HCL 10 MG PO TABS   Oral   Take 1 tablet (10 mg total) by mouth every 6 (six) hours.   15 tablet   0     BP 125/81  Pulse 107  Temp 98.6 F (37 C) (Oral)  Resp 24  SpO2 99%  LMP 02/04/2012  Physical Exam  Nursing note and vitals reviewed. Constitutional: She appears well-developed and well-nourished. No distress.  HENT:  Mouth/Throat: Oropharynx is clear and moist.  Eyes: Conjunctivae normal  are normal. No scleral icterus.  Neck: Neck supple. No tracheal deviation present.  Cardiovascular: Normal rate, regular rhythm, normal heart sounds and intact distal pulses.   Pulmonary/Chest: Effort normal and breath sounds normal. No respiratory distress.  Abdominal: Soft. Normal appearance and bowel sounds are normal. She exhibits no distension and no mass. There is no tenderness. There is no rebound and no guarding.  Genitourinary:       No cva tenderness  Musculoskeletal: She exhibits no edema.  Neurological: She is alert.  Skin: Skin is warm  and dry. No rash noted.  Psychiatric: She has a normal mood and affect.    ED Course  Procedures (including critical care time)   Labs Reviewed  CBC WITH DIFFERENTIAL  COMPREHENSIVE METABOLIC PANEL  LIPASE, BLOOD   Results for orders placed during the hospital encounter of 07/14/12  CBC WITH DIFFERENTIAL      Component Value Range   WBC 9.1  4.0 - 10.5 K/uL   RBC 4.90  3.87 - 5.11 MIL/uL   Hemoglobin 13.4  12.0 - 15.0 g/dL   HCT 16.1  09.6 - 04.5 %   MCV 81.8  78.0 - 100.0 fL   MCH 27.3  26.0 - 34.0 pg   MCHC 33.4  30.0 - 36.0 g/dL   RDW 40.9  81.1 - 91.4 %   Platelets 291  150 - 400 K/uL   Neutrophils Relative 58  43 - 77 %   Neutro Abs 5.3  1.7 - 7.7 K/uL   Lymphocytes Relative 29  12 - 46 %   Lymphs Abs 2.6  0.7 - 4.0 K/uL   Monocytes Relative 8  3 - 12 %   Monocytes Absolute 0.7  0.1 - 1.0 K/uL   Eosinophils Relative 5  0 - 5 %   Eosinophils Absolute 0.4  0.0 - 0.7 K/uL   Basophils Relative 1  0 - 1 %   Basophils Absolute 0.1  0.0 - 0.1 K/uL  COMPREHENSIVE METABOLIC PANEL      Component Value Range   Sodium 140  135 - 145 mEq/L   Potassium 3.7  3.5 - 5.1 mEq/L   Chloride 103  96 - 112 mEq/L   CO2 22  19 - 32 mEq/L   Glucose, Bld 118 (*) 70 - 99 mg/dL   BUN 13  6 - 23 mg/dL   Creatinine, Ser 7.82  0.50 - 1.10 mg/dL   Calcium 9.7  8.4 - 95.6 mg/dL   Total Protein 8.2  6.0 - 8.3 g/dL   Albumin 3.9  3.5 - 5.2 g/dL   AST 25  0 - 37 U/L   ALT 33  0 - 35 U/L   Alkaline Phosphatase 68  39 - 117 U/L   Total Bilirubin 0.3  0.3 - 1.2 mg/dL   GFR calc non Af Amer >90  >90 mL/min   GFR calc Af Amer >90  >90 mL/min  LIPASE, BLOOD      Component Value Range   Lipase 53  11 - 59 U/L       MDM  Labs.  Reviewed nursing notes and prior charts for additional history.   Pt w several prior evals for same symptoms -neg.  Pt w multiple prior cts for same in past couple yrs. Neg acute.  Today no nv. Eating/drinking. abd soft nt.   Pt appears stable for d/c.  Has pcp  w whom she can f /u closely.   Hr 92 rr 16. abd  soft nt.      Suzi Roots, MD 07/14/12 1953  Suzi Roots, MD 07/14/12 [phone]

## 2012-07-14 NOTE — ED Notes (Signed)
Per pt sts RLQ pain and flare up of pancreatitis. sts N,V,D.

## 2012-07-19 ENCOUNTER — Encounter (HOSPITAL_COMMUNITY): Payer: Self-pay | Admitting: *Deleted

## 2012-07-19 ENCOUNTER — Emergency Department (HOSPITAL_COMMUNITY)
Admission: EM | Admit: 2012-07-19 | Discharge: 2012-07-20 | Disposition: A | Discharge status: Home / Self Care | Payer: Medicaid Other | Attending: Emergency Medicine | Admitting: Emergency Medicine

## 2012-07-19 DIAGNOSIS — Z8742 Personal history of other diseases of the female genital tract: Secondary | ICD-10-CM | POA: Insufficient documentation

## 2012-07-19 DIAGNOSIS — R112 Nausea with vomiting, unspecified: Secondary | ICD-10-CM | POA: Insufficient documentation

## 2012-07-19 DIAGNOSIS — Z8719 Personal history of other diseases of the digestive system: Secondary | ICD-10-CM | POA: Insufficient documentation

## 2012-07-19 DIAGNOSIS — G8929 Other chronic pain: Secondary | ICD-10-CM | POA: Insufficient documentation

## 2012-07-19 DIAGNOSIS — F17200 Nicotine dependence, unspecified, uncomplicated: Secondary | ICD-10-CM | POA: Insufficient documentation

## 2012-07-19 DIAGNOSIS — R1084 Generalized abdominal pain: Secondary | ICD-10-CM | POA: Insufficient documentation

## 2012-07-19 DIAGNOSIS — Z862 Personal history of diseases of the blood and blood-forming organs and certain disorders involving the immune mechanism: Secondary | ICD-10-CM | POA: Insufficient documentation

## 2012-07-19 DIAGNOSIS — R109 Unspecified abdominal pain: Secondary | ICD-10-CM

## 2012-07-19 LAB — COMPREHENSIVE METABOLIC PANEL
Albumin: 3.3 g/dL — ABNORMAL LOW (ref 3.5–5.2)
Alkaline Phosphatase: 63 U/L (ref 39–117)
BUN: 10 mg/dL (ref 6–23)
CO2: 23 mEq/L (ref 19–32)
Chloride: 108 mEq/L (ref 96–112)
Creatinine, Ser: 0.65 mg/dL (ref 0.50–1.10)
GFR calc non Af Amer: >90 mL/min (ref >90)
Potassium: 4.1 mEq/L (ref 3.5–5.1)
Total Bilirubin: 0.2 mg/dL — ABNORMAL LOW (ref 0.3–1.2)

## 2012-07-19 LAB — LIPASE, BLOOD: Lipase: 77 U/L — ABNORMAL HIGH (ref 11–59)

## 2012-07-19 MED ORDER — OXYCODONE-ACETAMINOPHEN 5-325 MG PO TABS
1.0000 | ORAL_TABLET | Freq: Once | ORAL | Status: AC
  Administered 2012-07-20: 1 via ORAL
  Filled 2012-07-19 (×2): qty 1

## 2012-07-19 MED ORDER — METOCLOPRAMIDE HCL 5 MG/ML IJ SOLN
10.0000 mg | Freq: Once | INTRAMUSCULAR | Status: DC
  Filled 2012-07-19: qty 2

## 2012-07-19 NOTE — ED Notes (Signed)
Patient states she wants to wait until her blood work comes back before getting any medications.

## 2012-07-19 NOTE — ED Provider Notes (Signed)
History     CSN: [id number]  Arrival date & time 07/19/12  2131   First MD Initiated Contact with Patient 07/19/12 2304      Chief Complaint  Patient presents with  . Abdominal Pain    (Consider location/radiation/quality/duration/timing/severity/associated sxs/prior treatment) HPI Comments: Patient with a history of chronic abdominal pain and Pancreatitis presents today with a chief complaint of abdominal pain.  Pain is generalized.  She describes the pain as sharp.  Pain has been worse since yesterday.  Patient with numerous ED visits for the same.  She was last seen in the ED five days ago for the same symptoms.  She has also had numerous CT scans of her abdomen performed.  Patient reports that her pain today is similar to the pain that she has had in the past.  She had two episodes of vomiting earlier today.  No diarrhea.  She denies fever or chills.  No blood in her emesis.  She denies urinary symptoms.    Patient is a 32 y.o. female presenting with abdominal pain. The history is provided by the patient.  Abdominal Pain The primary symptoms of the illness include abdominal pain, nausea and vomiting. The primary symptoms of the illness do not include fever, shortness of breath, diarrhea, hematemesis, hematochezia, dysuria, vaginal discharge or vaginal bleeding.  The patient states that she believes she is currently not pregnant. The patient has not had a change in bowel habit. Symptoms associated with the illness do not include chills, urgency or frequency.    Past Medical History  Diagnosis Date  . Pancreatitis   . S/P laparoscopic cholecystectomy   . Fibroids   . Ovarian cyst   . Chronic abdominal pain   . Anemia     Past Surgical History  Procedure Date  . Cholecystectomy   . Abdominal hysterectomy   . Dilation and curettage of uterus     Family History  Problem Relation Age of Onset  . Diabetes Mother   . Hypertension Mother   . Hypertension Father   . Diabetes  Father   . Asthma Daughter     History  Substance Use Topics  . Smoking status: Current Every Day Smoker -- 0.5 packs/day for 10 years    Types: Cigarettes  . Smokeless tobacco: Never Used     Comment: declined  . Alcohol Use: No    OB History    Grav Para Term Preterm Abortions TAB SAB Ect Mult Living   4 2 1 1 2  2          Review of Systems  Constitutional: Negative for fever and chills.  Respiratory: Negative for shortness of breath.   Gastrointestinal: Positive for nausea, vomiting and abdominal pain. Negative for diarrhea, blood in stool, hematochezia, abdominal distention and hematemesis.  Genitourinary: Negative for dysuria, urgency, frequency, flank pain, vaginal bleeding, vaginal discharge, difficulty urinating and vaginal pain.  All other systems reviewed and are negative.    Allergies  Celecoxib; Fentanyl; Fish allergy; Ibuprofen; Ketorolac tromethamine; Meperidine hcl; Morphine; Ondansetron; Tramadol; Coconut flavor; Food; Zofran; Promethazine; Propoxyphene-acetaminophen; and Tylenol  Home Medications   Current Outpatient Rx  Name  Route  Sig  Dispense  Refill  . METOCLOPRAMIDE HCL 10 MG PO TABS   Oral   Take 10 mg by mouth every 8 (eight) hours as needed. For nausea           BP 124/73  Pulse 84  Temp 98.7 F (37.1 C) (Oral)  Resp 16  SpO2 100%  LMP 02/04/2012  Physical Exam  Nursing note and vitals reviewed. Constitutional: She appears well-developed and well-nourished. No distress.  HENT:  Head: Normocephalic and atraumatic.  Mouth/Throat: Oropharynx is clear and moist.  Neck: Normal range of motion. Neck supple.  Cardiovascular: Normal rate, regular rhythm and normal heart sounds.   Pulmonary/Chest: Effort normal and breath sounds normal.  Abdominal: Soft. Normal appearance and bowel sounds are normal. She exhibits no distension and no mass. There is generalized tenderness. There is no rigidity, no rebound, no guarding, no tenderness at  McBurney's point and negative Murphy's sign.  Musculoskeletal: Normal range of motion.  Neurological: She is alert.  Skin: Skin is warm and dry. She is not diaphoretic.  Psychiatric: She has a normal mood and affect.    ED Course  Procedures (including critical care time)   Labs Reviewed  CBC WITH DIFFERENTIAL  COMPREHENSIVE METABOLIC PANEL  LIPASE, BLOOD   No results found.   No diagnosis found.    MDM  Patient with a history of Chronic Pancreatitis and Chronic Abdominal Pain presents with abdominal pain similar to the pain that she has had in the past.  Pain not localized.  Patient with numerous ED visits for the same.  No vomiting while in the ED and patient able to tolerate PO liquids.  VSS.  Patient afebrile.  Labs unremarkable.  Lipase mildly elevated at 77, which is consistent with where it has been in the past.  Patient discharged home and recommended to follow up with PCP.        Pascal Lux Lemmon, PA-C 07/20/12 1300

## 2012-07-19 NOTE — ED Notes (Signed)
Pt c/o abdominal pain, n/v since yesterday.  States she has vomited "white stuff".

## 2012-07-20 LAB — CBC WITH DIFFERENTIAL/PLATELET
Basophils Absolute: 0.1 10*3/uL (ref 0.0–0.1)
Basophils Relative: 1 % (ref 0–1)
Eosinophils Relative: 7 % — ABNORMAL HIGH (ref 0–5)
HCT: 34.9 % — ABNORMAL LOW (ref 36.0–46.0)
Hemoglobin: 12.1 g/dL (ref 12.0–15.0)
Lymphocytes Relative: 38 % (ref 12–46)
Lymphs Abs: 2.2 10*3/uL (ref 0.7–4.0)
MCV: 81.5 fL (ref 78.0–100.0)
Monocytes Relative: 9 % (ref 3–12)
Neutro Abs: 2.6 10*3/uL (ref 1.7–7.7)
RBC: 4.28 MIL/uL (ref 3.87–5.11)
RDW: 12 % (ref 11.5–15.5)
WBC: 5.8 10*3/uL (ref 4.0–10.5)

## 2012-07-20 NOTE — ED Provider Notes (Signed)
Medical screening examination/treatment/procedure(s) were performed by non-physician practitioner and as supervising physician I was immediately available for consultation/collaboration.  Lizzeth Meder K Valora Norell-Rasch, MD 07/20/12 2303 

## 2012-07-27 ENCOUNTER — Encounter (HOSPITAL_COMMUNITY): Payer: Self-pay | Admitting: Emergency Medicine

## 2012-07-27 DIAGNOSIS — F17200 Nicotine dependence, unspecified, uncomplicated: Secondary | ICD-10-CM | POA: Insufficient documentation

## 2012-07-27 DIAGNOSIS — Z8719 Personal history of other diseases of the digestive system: Secondary | ICD-10-CM | POA: Insufficient documentation

## 2012-07-27 DIAGNOSIS — G8929 Other chronic pain: Secondary | ICD-10-CM | POA: Insufficient documentation

## 2012-07-27 DIAGNOSIS — R1012 Left upper quadrant pain: Secondary | ICD-10-CM | POA: Insufficient documentation

## 2012-07-27 DIAGNOSIS — Z90710 Acquired absence of both cervix and uterus: Secondary | ICD-10-CM | POA: Insufficient documentation

## 2012-07-27 DIAGNOSIS — Z9089 Acquired absence of other organs: Secondary | ICD-10-CM | POA: Insufficient documentation

## 2012-07-27 DIAGNOSIS — Z862 Personal history of diseases of the blood and blood-forming organs and certain disorders involving the immune mechanism: Secondary | ICD-10-CM | POA: Insufficient documentation

## 2012-07-27 DIAGNOSIS — Z8742 Personal history of other diseases of the female genital tract: Secondary | ICD-10-CM | POA: Insufficient documentation

## 2012-07-27 NOTE — ED Notes (Signed)
PT. REPORTS PERSISTENT GENERALIZED ABDOMINAL PAIN WITH VOMITTING AND DIARRHEA FOR SEVERAL WEEKS.

## 2012-07-28 ENCOUNTER — Emergency Department (HOSPITAL_COMMUNITY)
Admission: EM | Admit: 2012-07-28 | Discharge: 2012-07-28 | Disposition: A | Discharge status: Home / Self Care | Payer: Medicaid Other | Attending: Emergency Medicine | Admitting: Emergency Medicine

## 2012-07-28 DIAGNOSIS — G8929 Other chronic pain: Secondary | ICD-10-CM

## 2012-07-28 MED ORDER — METOCLOPRAMIDE HCL 10 MG PO TABS: 10.0000 mg | ORAL_TABLET | Freq: Four times a day (QID) | ORAL | Status: DC

## 2012-07-28 MED ORDER — METOCLOPRAMIDE HCL 5 MG/ML IJ SOLN
10.0000 mg | Freq: Once | INTRAMUSCULAR | Status: DC
  Filled 2012-07-28: qty 2

## 2012-07-28 MED ORDER — HYDROCODONE-ACETAMINOPHEN 7.5-325 MG/15ML PO SOLN: 7.5000 mL | Freq: Two times a day (BID) | ORAL | Status: DC

## 2012-07-28 NOTE — ED Notes (Signed)
The patient left AMA and did not sign out.

## 2012-07-28 NOTE — ED Provider Notes (Signed)
History     CSN: [id number]  Arrival date & time 07/27/12  2313   First MD Initiated Contact with Patient 07/28/12 0118      Chief Complaint  Patient presents with  . Abdominal Pain    (Consider location/radiation/quality/duration/timing/severity/associated sxs/prior treatment) HPI HX per PT - chronic ABD pain feels like she cant hold any fluids down, no diarrhea, no fever, pain is upper ABD, sharp in quality and not radiating. Evaluated at [facility] fopr the same yesterday and has scheduled follow up with a surgeon next week.no known aggrevating or alleviating factors. No new symptoms. Has this pain all of the time.   Past Medical History  Diagnosis Date  . Pancreatitis   . S/P laparoscopic cholecystectomy   . Fibroids   . Ovarian cyst   . Chronic abdominal pain   . Anemia     Past Surgical History  Procedure Date  . Cholecystectomy   . Abdominal hysterectomy   . Dilation and curettage of uterus     Family History  Problem Relation Age of Onset  . Diabetes Mother   . Hypertension Mother   . Hypertension Father   . Diabetes Father   . Asthma Daughter     History  Substance Use Topics  . Smoking status: Current Every Day Smoker -- 0.5 packs/day for 10 years    Types: Cigarettes  . Smokeless tobacco: Never Used     Comment: declined  . Alcohol Use: No    OB History    Grav Para Term Preterm Abortions TAB SAB Ect Mult Living   4 2 1 1 2  2          Review of Systems  Constitutional: Negative for fever and chills.  HENT: Negative for neck pain and neck stiffness.   Eyes: Negative for pain.  Respiratory: Negative for shortness of breath.   Cardiovascular: Negative for chest pain.  Gastrointestinal: Positive for abdominal pain. Negative for blood in stool.  Genitourinary: Negative for dysuria.  Musculoskeletal: Negative for back pain.  Skin: Negative for rash.  Neurological: Negative for headaches.  All other systems reviewed and are  negative.    Allergies  Celecoxib; Fentanyl; Fish allergy; Ibuprofen; Ketorolac tromethamine; Meperidine hcl; Morphine; Ondansetron; Tramadol; Coconut flavor; Food; Zofran; Promethazine; Propoxyphene-acetaminophen; and Tylenol  Home Medications   Current Outpatient Rx  Name  Route  Sig  Dispense  Refill  . METOCLOPRAMIDE HCL 10 MG PO TABS   Oral   Take 10 mg by mouth every 8 (eight) hours as needed. For nausea         . HYDROCODONE-ACETAMINOPHEN 7.5-325 MG/15ML PO SOLN   Oral   Take 7.5 mLs by mouth 2 (two) times daily.   30 mL   0   . METOCLOPRAMIDE HCL 10 MG PO TABS   Oral   Take 1 tablet (10 mg total) by mouth every 6 (six) hours.   30 tablet   0     BP 124/90  Pulse 85  Temp 98.5 F (36.9 C) (Oral)  Resp 16  SpO2 100%  LMP 02/04/2012  Physical Exam  Nursing note and vitals reviewed. Constitutional: She is oriented to person, place, and time. She appears well-developed and well-nourished.  HENT:  Head: Normocephalic and atraumatic.  Eyes: EOM are normal. Pupils are equal, round, and reactive to light. No scleral icterus.  Neck: Neck supple.  Cardiovascular: Normal rate, normal heart sounds and intact distal pulses.   Pulmonary/Chest: Effort normal and breath sounds normal. No  respiratory distress.  Abdominal:       Mild LUQ TTP.  No rebound/ guarding/ acute ABD  Musculoskeletal: Normal range of motion. She exhibits no edema.  Neurological: She is alert and oriented to person, place, and time.  Skin: Skin is warm and dry.    ED Course  Procedures (including critical care time)    1. Chronic abdominal pain     IM reglan ordered/ declined. On recheck, tolerates PO fluids, NAD. Mmm. Repeat exam unchanged. No indication for IVFs or labs at this time. Plan d/c home and outpatient follow up  MDM   Chronic ABD pain. Old records reviewed. Serial exams, VS and Nursing notes reviewed. Declines IM meds. Stable for d/c home.         Sunnie Nielsen,  MD 07/28/12 0700

## 2012-07-28 NOTE — ED Notes (Signed)
Pt refused Reglan, requesting to speak to Dr. Dierdre Highman. She states, "He told me that he was going to give me fluids and that my enzymes are elevated. I need to talk to him. Explained that Dr. Is busy at this  time with a critical Pt.

## 2012-07-28 NOTE — ED Notes (Signed)
Pt leaving AMA. EDP refused to give pt IV and IV pain medication. Pt refused to take reglan. Explained to pt that Reglan may help her and she may want to try. Damon, RN at bedside.

## 2012-07-28 NOTE — ED Notes (Signed)
The patient does not want to take the Reglan, and she wants to speak with Dr. Dierdre Highman.

## 2012-07-28 NOTE — ED Notes (Addendum)
Dr. Dierdre Highman notified that pt would like to speak with him.

## 2012-07-31 ENCOUNTER — Encounter (HOSPITAL_COMMUNITY): Payer: Self-pay | Admitting: *Deleted

## 2012-07-31 ENCOUNTER — Emergency Department (HOSPITAL_COMMUNITY)
Admission: EM | Admit: 2012-07-31 | Discharge: 2012-08-01 | Disposition: A | Discharge status: Home / Self Care | Payer: Medicaid Other | Attending: Emergency Medicine | Admitting: Emergency Medicine

## 2012-07-31 DIAGNOSIS — R109 Unspecified abdominal pain: Secondary | ICD-10-CM | POA: Insufficient documentation

## 2012-07-31 DIAGNOSIS — K859 Acute pancreatitis without necrosis or infection, unspecified: Secondary | ICD-10-CM | POA: Insufficient documentation

## 2012-07-31 DIAGNOSIS — Z8742 Personal history of other diseases of the female genital tract: Secondary | ICD-10-CM | POA: Insufficient documentation

## 2012-07-31 DIAGNOSIS — G8929 Other chronic pain: Secondary | ICD-10-CM

## 2012-07-31 DIAGNOSIS — F17200 Nicotine dependence, unspecified, uncomplicated: Secondary | ICD-10-CM | POA: Insufficient documentation

## 2012-07-31 DIAGNOSIS — Z90710 Acquired absence of both cervix and uterus: Secondary | ICD-10-CM | POA: Insufficient documentation

## 2012-07-31 DIAGNOSIS — Z862 Personal history of diseases of the blood and blood-forming organs and certain disorders involving the immune mechanism: Secondary | ICD-10-CM | POA: Insufficient documentation

## 2012-07-31 MED ORDER — METOCLOPRAMIDE HCL 5 MG/ML IJ SOLN
10.0000 mg | Freq: Once | INTRAMUSCULAR | Status: DC
  Filled 2012-07-31: qty 2

## 2012-07-31 NOTE — ED Notes (Signed)
Pt reports her pancreatitis is flared up x 2 weeks.  Pt reports N/V/D.

## 2012-08-01 LAB — COMPREHENSIVE METABOLIC PANEL
ALT: 66 U/L — ABNORMAL HIGH (ref 0–35)
AST: 33 U/L (ref 0–37)
CO2: 27 mEq/L (ref 19–32)
Chloride: 104 mEq/L (ref 96–112)
Creatinine, Ser: 0.65 mg/dL (ref 0.50–1.10)
GFR calc Af Amer: >90 mL/min (ref >90)
GFR calc non Af Amer: >90 mL/min (ref >90)
Glucose, Bld: 95 mg/dL (ref 70–99)
Total Bilirubin: 0.2 mg/dL — ABNORMAL LOW (ref 0.3–1.2)

## 2012-08-01 LAB — LIPASE, BLOOD: Lipase: 144 U/L — ABNORMAL HIGH (ref 11–59)

## 2012-08-01 MED ORDER — OXYCODONE-ACETAMINOPHEN 5-325 MG PO TABS
2.0000 | ORAL_TABLET | Freq: Once | ORAL | Status: AC
  Administered 2012-08-01: 2 via ORAL
  Filled 2012-08-01: qty 2

## 2012-08-01 NOTE — ED Notes (Signed)
Patient given copy of discharge paperwork; went over discharge instructions with patient.  Patient instructed to follow up with primary care physician and to return to the ED for new, worsening, or concerning symptoms.   

## 2012-08-01 NOTE — ED Provider Notes (Signed)
History     CSN: [id number]  Arrival date & time 07/31/12  2255   First MD Initiated Contact with Patient 07/31/12 2331      Chief Complaint  Patient presents with  . Abdominal Pain    (Consider location/radiation/quality/duration/timing/severity/associated sxs/prior treatment) HPI 32 year-old the female presents to emergency room complaining of abdominal pain, nausea and vomiting. Patient is well-known to the emergency department, has had greater than 40 visit for last 6 months. She has history of acute on chronic abdominal pain with flares of pancreatitis. She is followed by local primary care doctor who manages her pain meds, and is also followed at [facility]. She reports she has followup with a pancreas specialist this Friday. She reports she's been taking her home pain medications and nausea medications, without improvement in symptoms. She reports she is "had a flare of her pancreas" for last several days Past Medical History  Diagnosis Date  . Pancreatitis   . S/P laparoscopic cholecystectomy   . Fibroids   . Ovarian cyst   . Chronic abdominal pain   . Anemia     Past Surgical History  Procedure Date  . Cholecystectomy   . Abdominal hysterectomy   . Dilation and curettage of uterus     Family History  Problem Relation Age of Onset  . Diabetes Mother   . Hypertension Mother   . Hypertension Father   . Diabetes Father   . Asthma Daughter     History  Substance Use Topics  . Smoking status: Current Every Day Smoker -- 0.5 packs/day for 10 years    Types: Cigarettes  . Smokeless tobacco: Never Used     Comment: declined  . Alcohol Use: No    OB History    Grav Para Term Preterm Abortions TAB SAB Ect Mult Living   4 2 1 1 2  2          Review of Systems  See History of Present Illness; otherwise all other systems are reviewed and negative Allergies  Celecoxib; Fentanyl; Fish allergy; Ibuprofen; Ketorolac tromethamine; Meperidine hcl; Morphine; Ondansetron;  Tramadol; Coconut flavor; Food; Zofran; Promethazine; Propoxyphene-acetaminophen; and Tylenol  Home Medications   Current Outpatient Rx  Name  Route  Sig  Dispense  Refill  . HYDROCODONE-ACETAMINOPHEN 7.5-325 MG/15ML PO SOLN   Oral   Take 7.5 mLs by mouth 2 (two) times daily.   30 mL   0   . METOCLOPRAMIDE HCL 10 MG PO TABS   Oral   Take 10 mg by mouth every 8 (eight) hours as needed. For nausea           BP 129/76  Pulse 85  Temp 98.1 F (36.7 C) (Oral)  Resp 16  SpO2 98%  LMP 02/04/2012  Physical Exam  Nursing note and vitals reviewed. Constitutional: She is oriented to person, place, and time. She appears well-developed and well-nourished. No distress.  HENT:  Head: Normocephalic and atraumatic.  Nose: Nose normal.  Mouth/Throat: Oropharynx is clear and moist.  Eyes: Conjunctivae normal and EOM are normal. Pupils are equal, round, and reactive to light.  Neck: Normal range of motion. Neck supple. No JVD present. No tracheal deviation present. No thyromegaly present.  Cardiovascular: Normal rate, regular rhythm, normal heart sounds and intact distal pulses.  Exam reveals no gallop and no friction rub.   No murmur heard. Pulmonary/Chest: Effort normal and breath sounds normal. No stridor. No respiratory distress. She has no wheezes. She has no rales. She exhibits no  tenderness.  Abdominal: Soft. Bowel sounds are normal. She exhibits no distension and no mass. There is tenderness (tenderness in upper abdomen). There is no rebound and no guarding.  Musculoskeletal: Normal range of motion. She exhibits no edema and no tenderness.  Lymphadenopathy:    She has no cervical adenopathy.  Neurological: She is oriented to person, place, and time. She exhibits normal muscle tone. Coordination normal.  Skin: Skin is warm and dry. No rash noted. No erythema. No pallor.  Psychiatric: She has a normal mood and affect. Her behavior is normal. Judgment and thought content normal.     ED Course  Procedures (including critical care time)  Labs Reviewed  LIPASE, BLOOD - Abnormal; Notable for the following:    Lipase 144 (*)     All other components within normal limits  COMPREHENSIVE METABOLIC PANEL - Abnormal; Notable for the following:    ALT 66 (*)     Total Bilirubin 0.2 (*)     All other components within normal limits  LAB REPORT - SCANNED   No results found.   1. Chronic abdominal pain       MDM  32 year old female with acute on chronic abdominal pain. Her lipase is slightly elevated, but no worse than her norm. To followup on Friday.        Olivia Mackie, MD 08/01/12 [phone]

## 2012-08-01 NOTE — ED Notes (Signed)
Patient complaining of abdominal pain caused by pancreatitis; reports current flare-up has been going on for about two weeks.  Patient complaining of upper abdominal pain, nausea, vomiting, and diarrhea.  Patient alert and oriented x4; PERRL present.  Will continue to monitor.

## 2012-08-03 ENCOUNTER — Encounter (HOSPITAL_COMMUNITY): Payer: Self-pay

## 2012-08-03 ENCOUNTER — Emergency Department (HOSPITAL_COMMUNITY)
Admission: EM | Admit: 2012-08-03 | Discharge: 2012-08-03 | Disposition: A | Discharge status: Home / Self Care | Payer: Medicaid Other | Attending: Emergency Medicine | Admitting: Emergency Medicine

## 2012-08-03 DIAGNOSIS — Z862 Personal history of diseases of the blood and blood-forming organs and certain disorders involving the immune mechanism: Secondary | ICD-10-CM | POA: Insufficient documentation

## 2012-08-03 DIAGNOSIS — F17200 Nicotine dependence, unspecified, uncomplicated: Secondary | ICD-10-CM | POA: Insufficient documentation

## 2012-08-03 DIAGNOSIS — N9489 Other specified conditions associated with female genital organs and menstrual cycle: Secondary | ICD-10-CM | POA: Insufficient documentation

## 2012-08-03 DIAGNOSIS — Z8719 Personal history of other diseases of the digestive system: Secondary | ICD-10-CM | POA: Insufficient documentation

## 2012-08-03 DIAGNOSIS — Z8742 Personal history of other diseases of the female genital tract: Secondary | ICD-10-CM | POA: Insufficient documentation

## 2012-08-03 DIAGNOSIS — Z87440 Personal history of urinary (tract) infections: Secondary | ICD-10-CM | POA: Insufficient documentation

## 2012-08-03 DIAGNOSIS — L299 Pruritus, unspecified: Secondary | ICD-10-CM | POA: Insufficient documentation

## 2012-08-03 DIAGNOSIS — R1013 Epigastric pain: Secondary | ICD-10-CM | POA: Insufficient documentation

## 2012-08-03 DIAGNOSIS — G8929 Other chronic pain: Secondary | ICD-10-CM | POA: Insufficient documentation

## 2012-08-03 DIAGNOSIS — N898 Other specified noninflammatory disorders of vagina: Secondary | ICD-10-CM

## 2012-08-03 DIAGNOSIS — Z79899 Other long term (current) drug therapy: Secondary | ICD-10-CM | POA: Insufficient documentation

## 2012-08-03 DIAGNOSIS — N760 Acute vaginitis: Secondary | ICD-10-CM | POA: Insufficient documentation

## 2012-08-03 LAB — WET PREP, GENITAL: Trich, Wet Prep: NONE SEEN

## 2012-08-03 MED ORDER — BENZOCAINE-RESORCINOL 5-2 % VA CREA: TOPICAL_CREAM | Freq: Every day | VAGINAL | Status: DC

## 2012-08-03 MED ORDER — METRONIDAZOLE 500 MG PO TABS: 500.0000 mg | ORAL_TABLET | Freq: Two times a day (BID) | ORAL | Status: DC

## 2012-08-03 MED ORDER — METRONIDAZOLE 500 MG PO TABS
500.0000 mg | ORAL_TABLET | Freq: Once | ORAL | Status: AC
  Administered 2012-08-03: 500 mg via ORAL
  Filled 2012-08-03: qty 1

## 2012-08-03 NOTE — ED Provider Notes (Signed)
History     CSN: [id number]  Arrival date & time 08/03/12  1354   First MD Initiated Contact with Patient 08/03/12 1725      No chief complaint on file.   (Consider location/radiation/quality/duration/timing/severity/associated sxs/prior treatment) The history is provided by the patient and medical records.    [name] is a 32 y.o. female  with a hx of pancreatitis, chronic abdominal pain presents to the Emergency Department complaining of gradual, persistent, progressively worsening vaginal itching and discharge onset 7 days. Associated symptoms include itching, burning, swelling, white discharge.  Pt also c/o of her chronic abdominal pain, but this is not her main concern today and unchanged from her normal baseline.  Pt without nausea or vomiting and tolerating PO.  Nothing makes it better and taking a bath makes it worse.  Pt denies fever, chills, headache, nausea, vomiting, diarrhea, dysuria, hematuria, weakness, dizziness.    Past Medical History  Diagnosis Date  . Pancreatitis   . S/P laparoscopic cholecystectomy   . Fibroids   . Ovarian cyst   . Chronic abdominal pain   . Anemia     Past Surgical History  Procedure Date  . Cholecystectomy   . Abdominal hysterectomy   . Dilation and curettage of uterus     Family History  Problem Relation Age of Onset  . Diabetes Mother   . Hypertension Mother   . Hypertension Father   . Diabetes Father   . Asthma Daughter     History  Substance Use Topics  . Smoking status: Current Every Day Smoker -- 0.5 packs/day for 10 years    Types: Cigarettes  . Smokeless tobacco: Never Used     Comment: declined  . Alcohol Use: No    OB History    Grav Para Term Preterm Abortions TAB SAB Ect Mult Living   4 2 1 1 2  2          Review of Systems  Constitutional: Negative for fever, diaphoresis, appetite change, fatigue and unexpected weight change.  HENT: Negative for mouth sores and neck stiffness.   Eyes: Negative  for visual disturbance.  Respiratory: Negative for cough, chest tightness, shortness of breath and wheezing.   Cardiovascular: Negative for chest pain.  Gastrointestinal: Negative for nausea, vomiting, abdominal pain, diarrhea and constipation.  Genitourinary: Positive for vaginal discharge. Negative for dysuria, urgency, frequency, hematuria, vaginal bleeding and vaginal pain.  Musculoskeletal: Negative for back pain.  Skin: Negative for rash.  Neurological: Negative for syncope, light-headedness and headaches.  Hematological: Does not bruise/bleed easily.  Psychiatric/Behavioral: Negative for sleep disturbance. The patient is not nervous/anxious.   All other systems reviewed and are negative.    Allergies  Celecoxib; Fentanyl; Fish allergy; Ibuprofen; Ketorolac tromethamine; Meperidine hcl; Morphine; Ondansetron; Tramadol; Coconut flavor; Food; Zofran; Promethazine; Propoxyphene-acetaminophen; and Tylenol  Home Medications   Current Outpatient Rx  Name  Route  Sig  Dispense  Refill  . HYDROCODONE-ACETAMINOPHEN 5-325 MG PO TABS   Oral   Take 1 tablet by mouth every 6 (six) hours as needed. For pain         . BENZOCAINE-RESORCINOL 5-2 % VA CREA   Vaginal   Place vaginally at bedtime.   60 g   0   . METRONIDAZOLE 500 MG PO TABS   Oral   Take 1 tablet (500 mg total) by mouth 2 (two) times daily.   14 tablet   0     BP 118/69  Pulse 77  Temp 97.2  F (36.2 C) (Oral)  Resp 20  SpO2 98%  LMP 02/04/2012  Physical Exam  Nursing note and vitals reviewed. Constitutional: She appears well-developed and well-nourished. No distress.  HENT:  Head: Normocephalic and atraumatic.  Mouth/Throat: Oropharynx is clear and moist. No oropharyngeal exudate.  Eyes: Conjunctivae normal are normal. Pupils are equal, round, and reactive to light. No scleral icterus.  Neck: Normal range of motion. Neck supple.  Cardiovascular: Normal rate, regular rhythm, normal heart sounds and intact  distal pulses.  Exam reveals no gallop and no friction rub.   No murmur heard. Pulmonary/Chest: Effort normal and breath sounds normal. No respiratory distress. She has no wheezes. She has no rales. She exhibits no tenderness.  Abdominal: Soft. Bowel sounds are normal. She exhibits no distension and no mass. There is tenderness (mild, at baseline) in the epigastric area. There is no rigidity, no rebound, no guarding, no CVA tenderness, no tenderness at McBurney's point and negative Murphy's sign.  Genitourinary: Pelvic exam was performed with patient supine. There is no rash, tenderness, lesion or injury on the right labia. There is no rash, tenderness, lesion or injury on the left labia. Uterus is not deviated, not enlarged, not fixed and not tender. Cervix exhibits no motion tenderness, no discharge and no friability. Right adnexum displays no mass, no tenderness and no fullness. Left adnexum displays no mass, no tenderness and no fullness. No erythema, tenderness or bleeding around the vagina. No foreign body around the vagina. No signs of injury around the vagina. Vaginal discharge ( copious thick white) found.  Musculoskeletal: Normal range of motion. She exhibits no edema.  Lymphadenopathy:    She has no cervical adenopathy.  Neurological: She is alert.       Speech is clear and goal oriented Moves extremities without ataxia  Skin: Skin is warm and dry. No rash noted. She is not diaphoretic. No erythema.  Psychiatric: She has a normal mood and affect.    ED Course  Procedures (including critical care time)  Labs Reviewed  WET PREP, GENITAL - Abnormal; Notable for the following:    Clue Cells Wet Prep HPF POC MODERATE (*)     All other components within normal limits  GC/CHLAMYDIA PROBE AMP   No results found.   1. BV (bacterial vaginosis)   2. Vaginal discharge       MDM  [name] presents for vaginal discharge.  Wet prep with clue cells suggesting BV.  GC/Chlam  cultures pending.  Pt tolerating PO without difficulty.  D/c home with metronidazole and vagisil for itching.    1. Medications: vagisil, metronidazole, usual home medications  2. Treatment: rest, drink plenty of fluids, take medications as prescribed  3. Follow Up: Please followup with your primary doctor for discussion of your diagnoses and further evaluation after today's visit;          Dahlia Client Valin Massie, PA-C 08/03/12 1942

## 2012-08-03 NOTE — ED Provider Notes (Signed)
Medical screening examination/treatment/procedure(s) were performed by non-physician practitioner and as supervising physician I was immediately available for consultation/collaboration.   [name]. Oletta Lamas, MD 08/03/12 1944

## 2012-08-03 NOTE — ED Notes (Addendum)
Pt presents with 3 week h/o vaginal itching and minimal discharge.  Pt reports labia is red, swollen and is painful, was seen by PCP and told to take OTC vaginal cream for yeast infection, was told she had a UTI as well, has taken abx for that.  Pt reports after using the cream, her symptoms worsened.  Pt denies any lower abdominal pain.  Pt requesting female provider.

## 2012-08-03 NOTE — ED Notes (Signed)
Pt resting on bed with boyfriend at bedside.  Pt requesting food at this time, and made aware we are still awaiting the return of her labs.  No other wants or needs at this time

## 2012-08-04 LAB — GC/CHLAMYDIA PROBE AMP: GC Probe RNA: NEGATIVE

## 2012-08-13 ENCOUNTER — Emergency Department (HOSPITAL_COMMUNITY)
Admission: EM | Admit: 2012-08-13 | Discharge: 2012-08-13 | Disposition: A | Discharge status: Home / Self Care | Payer: Medicaid Other | Attending: Emergency Medicine | Admitting: Emergency Medicine

## 2012-08-13 ENCOUNTER — Encounter (HOSPITAL_COMMUNITY): Payer: Self-pay

## 2012-08-13 DIAGNOSIS — Z862 Personal history of diseases of the blood and blood-forming organs and certain disorders involving the immune mechanism: Secondary | ICD-10-CM | POA: Insufficient documentation

## 2012-08-13 DIAGNOSIS — F17200 Nicotine dependence, unspecified, uncomplicated: Secondary | ICD-10-CM | POA: Insufficient documentation

## 2012-08-13 DIAGNOSIS — Z8742 Personal history of other diseases of the female genital tract: Secondary | ICD-10-CM | POA: Insufficient documentation

## 2012-08-13 DIAGNOSIS — G8929 Other chronic pain: Secondary | ICD-10-CM | POA: Insufficient documentation

## 2012-08-13 DIAGNOSIS — R1013 Epigastric pain: Secondary | ICD-10-CM | POA: Insufficient documentation

## 2012-08-13 DIAGNOSIS — R109 Unspecified abdominal pain: Secondary | ICD-10-CM

## 2012-08-13 DIAGNOSIS — Z9889 Other specified postprocedural states: Secondary | ICD-10-CM | POA: Insufficient documentation

## 2012-08-13 DIAGNOSIS — Z8719 Personal history of other diseases of the digestive system: Secondary | ICD-10-CM | POA: Insufficient documentation

## 2012-08-13 LAB — CBC WITH DIFFERENTIAL/PLATELET
Basophils Relative: 0 % (ref 0–1)
Hemoglobin: 12.5 g/dL (ref 12.0–15.0)
Lymphs Abs: 3.5 10*3/uL (ref 0.7–4.0)
MCHC: 34.3 g/dL (ref 30.0–36.0)
Monocytes Relative: 9 % (ref 3–12)
Neutro Abs: 4.2 10*3/uL (ref 1.7–7.7)
Neutrophils Relative %: 46 % (ref 43–77)
RBC: 4.44 MIL/uL (ref 3.87–5.11)
WBC: 9.2 10*3/uL (ref 4.0–10.5)

## 2012-08-13 LAB — COMPREHENSIVE METABOLIC PANEL
BUN: 15 mg/dL (ref 6–23)
CO2: 22 mEq/L (ref 19–32)
Calcium: 9.1 mg/dL (ref 8.4–10.5)
Creatinine, Ser: 0.6 mg/dL (ref 0.50–1.10)
GFR calc Af Amer: >90 mL/min (ref >90)
GFR calc non Af Amer: >90 mL/min (ref >90)
Glucose, Bld: 108 mg/dL — ABNORMAL HIGH (ref 70–99)

## 2012-08-13 LAB — URINALYSIS, MICROSCOPIC ONLY
Glucose, UA: NEGATIVE mg/dL
Leukocytes, UA: NEGATIVE
Nitrite: NEGATIVE
Specific Gravity, Urine: 1.036 — ABNORMAL HIGH (ref 1.005–1.030)
pH: 5.5 (ref 5.0–8.0)

## 2012-08-13 MED ORDER — HYDROCODONE-ACETAMINOPHEN 5-325 MG PO TABS: 2.0000 | ORAL_TABLET | ORAL | Status: DC | PRN

## 2012-08-13 NOTE — ED Notes (Signed)
The pt refused to get undressed and is here for pain med

## 2012-08-13 NOTE — ED Notes (Signed)
The pt is c/o epigastic pain.  She she was  Dismissed from [facility] yesterday and she was not given any pain med. And she want something for pain.  She is scheduled for a procedure this coming tuesday

## 2012-08-13 NOTE — ED Provider Notes (Signed)
History     CSN: [id number]  Arrival date & time 08/13/12  0049   First MD Initiated Contact with Patient 08/13/12 0217      Chief Complaint  Patient presents with  . Abdominal Pain    (Consider location/radiation/quality/duration/timing/severity/associated sxs/prior treatment) HPI Comments: [name] is a 32 y.o. Female who complains of recurrent abdominal pain, despite recent inpatient treatment, and discharge, 2 days ago. The doctor did not give her pain medicines, when she left. She has an upcoming ERCP scheduled. She denies fever, chills, nausea, or vomiting. There are no modifying factors  The history is provided by the patient.    Past Medical History  Diagnosis Date  . Pancreatitis   . S/P laparoscopic cholecystectomy   . Fibroids   . Ovarian cyst   . Chronic abdominal pain   . Anemia     Past Surgical History  Procedure Laterality Date  . Cholecystectomy    . Abdominal hysterectomy    . Dilation and curettage of uterus      Family History  Problem Relation Age of Onset  . Diabetes Mother   . Hypertension Mother   . Hypertension Father   . Diabetes Father   . Asthma Daughter     History  Substance Use Topics  . Smoking status: Current Every Day Smoker -- 0.50 packs/day for 10 years    Types: Cigarettes  . Smokeless tobacco: Never Used  . Alcohol Use: No    OB History   Grav Para Term Preterm Abortions TAB SAB Ect Mult Living   4 2 1 1 2  2          Review of Systems  All other systems reviewed and are negative.    Allergies  Celecoxib; Fentanyl; Ibuprofen; Ketorolac tromethamine; Meperidine hcl; Morphine; Ondansetron; Shellfish allergy; Tramadol; Coconut flavor; Promethazine; Propoxyphene-acetaminophen; and Tylenol  Home Medications   Current Outpatient Rx  Name  Route  Sig  Dispense  Refill  . HYDROcodone-acetaminophen (NORCO) 5-325 MG per tablet   Oral   Take 2 tablets by mouth every 4 (four) hours as needed for pain.   20  tablet   0     BP 123/83  Pulse 97  Temp(Src) 98.3 F (36.8 C) (Oral)  Resp 16  SpO2 100%  LMP 02/04/2012  Physical Exam  Nursing note and vitals reviewed. Constitutional: She is oriented to person, place, and time. She appears well-developed and well-nourished.  HENT:  Head: Normocephalic and atraumatic.  Eyes: Conjunctivae and EOM are normal. Pupils are equal, round, and reactive to light.  Neck: Normal range of motion and phonation normal. Neck supple.  Cardiovascular: Normal rate, regular rhythm and intact distal pulses.   Pulmonary/Chest: Effort normal and breath sounds normal. She exhibits no tenderness.  Abdominal: Soft. She exhibits no distension. There is tenderness (Epigastric, mild). There is no guarding.  Musculoskeletal: Normal range of motion.  Neurological: She is alert and oriented to person, place, and time. She has normal strength. She exhibits normal muscle tone.  Skin: Skin is warm and dry.  Psychiatric: She has a normal mood and affect. Her behavior is normal. Judgment and thought content normal.    ED Course  Procedures (including critical care time)    Note from [facility]- recent D/C  Admit date: 08/09/2012  Discharge date and time: 08/11/2012  Admitting Physician: Roby Lofts, MD   Discharge Physician: Emily Filbert, MD  Admission Diagnoses: Acute pancreatitis  Discharge Diagnoses:  Active Problems: Pancreatitis Resolved  Problems: * No resolved hospital problems. *   Admission Condition: fair Discharged Condition: good  Indication for Admission: Please refer to H and P.  32 yo AAF with chronic abdominal pain and chronic pancreatitis who presented to the ED last night with complaint of severe abdominal pain. Patient states that pain started 8 days ago and was accompanied by nausea and diarrhea. She has not had any fevers. Pain is located in epigastric area. Pain is controlled with Dilaudid in the emergency department.  Patient was seen by gastroenterology on 1/31 and plans for ERCP was made for next week. Patient reports multiple similar episodes in the past. She has been able to eat and drink some without vomiting, though she has had nausea which has limited her intake. Admitted for further management.  Hospital Course:   # Acute on chronic pancreatitis with known pancreas divisum: MRCP without any evidence of acute pancreatitis. Pancreas divisum is present. Lipase normalized, TG slightly elevated 214, IgG4 19.9 (normal) . Other labs WNL. Symptomatic management done with IVF, antiemetics and IV pain meds. She tolerated PO intake. GI was consulted but due to the acute symptoms there was no indication for any procedure. She will follow up with Dr. Logan Bores on 08/15/2012 for ERCP for further evaluation of pancreas divisum. Since she was tolerating PO intake and remained hemodynamically stable she was deemed stable for discharge.  # Narcotic dependence and drug seeking behaviors: She was demanding that she get IV dilaudid and IV Benadryl. She was never happy with me tapering IV pain meds. She did not appear to be in any kind of distress due to the pain she complained of. Hence there is significant component of narcotic dependence and drug seeking behaviors. I did not provide any written script for her home pain meds. I asked her to contact her PCP.     Labs Reviewed  URINALYSIS, MICROSCOPIC ONLY - Abnormal; Notable for the following:    APPearance CLOUDY (*)    Specific Gravity, Urine 1.036 (*)    Bacteria, UA FEW (*)    Squamous Epithelial / LPF MANY (*)    All other components within normal limits  COMPREHENSIVE METABOLIC PANEL - Abnormal; Notable for the following:    Glucose, Bld 108 (*)    AST 59 (*)    ALT 139 (*)    Total Bilirubin 0.1 (*)    All other components within normal limits  CBC WITH DIFFERENTIAL - Abnormal; Notable for the following:    Eosinophils Relative 7 (*)    All other components within  normal limits  URINE CULTURE  LIPASE, BLOOD    Nursing Notes Reviewed/ Care Coordinated Applicable Imaging Reviewed Interpretation of Laboratory Data incorporated into ED treatment  1. Abdominal pain, recurrent       MDM  Ongoing and recurrent abdominal pain. Patient has been known to exhibit drug-seeking behavior. She will be given the benefit of doubt, today, and given a short course of narcotic analgesia. Doubt metabolic instability, serious bacterial infection or impending vascular collapse; the patient is stable for discharge.   Plan: Home Medications- Norc; Home Treatments- rest; Recommended follow up- PCP and GI as scheduled     Flint Melter, MD 08/13/12 [phone]

## 2012-08-13 NOTE — ED Notes (Signed)
Patient presents today with c/o severe abdominal pain. Reports that she was admitted to [facility] from 08/08/12-08/12/12 d/t pancreatitis.

## 2012-08-13 NOTE — ED Notes (Signed)
Pt refused to undress and put on a hospital gown. Pt also refused to be put on the monitor.

## 2012-08-14 LAB — URINE CULTURE

## 2012-08-17 ENCOUNTER — Emergency Department (HOSPITAL_COMMUNITY): Payer: Medicaid Other

## 2012-08-17 ENCOUNTER — Encounter (HOSPITAL_COMMUNITY): Payer: Self-pay | Admitting: *Deleted

## 2012-08-17 ENCOUNTER — Emergency Department (HOSPITAL_COMMUNITY)
Admission: EM | Admit: 2012-08-17 | Discharge: 2012-08-17 | Disposition: A | Discharge status: Home / Self Care | Payer: Medicaid Other | Attending: Emergency Medicine | Admitting: Emergency Medicine

## 2012-08-17 DIAGNOSIS — R109 Unspecified abdominal pain: Secondary | ICD-10-CM

## 2012-08-17 DIAGNOSIS — Z79899 Other long term (current) drug therapy: Secondary | ICD-10-CM | POA: Insufficient documentation

## 2012-08-17 DIAGNOSIS — Z862 Personal history of diseases of the blood and blood-forming organs and certain disorders involving the immune mechanism: Secondary | ICD-10-CM | POA: Insufficient documentation

## 2012-08-17 DIAGNOSIS — K861 Other chronic pancreatitis: Secondary | ICD-10-CM | POA: Insufficient documentation

## 2012-08-17 DIAGNOSIS — R112 Nausea with vomiting, unspecified: Secondary | ICD-10-CM | POA: Insufficient documentation

## 2012-08-17 DIAGNOSIS — Z9089 Acquired absence of other organs: Secondary | ICD-10-CM

## 2012-08-17 DIAGNOSIS — Z9889 Other specified postprocedural states: Secondary | ICD-10-CM | POA: Insufficient documentation

## 2012-08-17 DIAGNOSIS — K920 Hematemesis: Secondary | ICD-10-CM | POA: Insufficient documentation

## 2012-08-17 DIAGNOSIS — F17200 Nicotine dependence, unspecified, uncomplicated: Secondary | ICD-10-CM | POA: Insufficient documentation

## 2012-08-17 DIAGNOSIS — Z8742 Personal history of other diseases of the female genital tract: Secondary | ICD-10-CM | POA: Insufficient documentation

## 2012-08-17 DIAGNOSIS — Z9049 Acquired absence of other specified parts of digestive tract: Secondary | ICD-10-CM | POA: Insufficient documentation

## 2012-08-17 LAB — CBC WITH DIFFERENTIAL/PLATELET
Basophils Absolute: 0.1 10*3/uL (ref 0.0–0.1)
Basophils Relative: 1 % (ref 0–1)
Eosinophils Absolute: 0.7 10*3/uL (ref 0.0–0.7)
Eosinophils Relative: 9 % — ABNORMAL HIGH (ref 0–5)
HCT: 37.6 % (ref 36.0–46.0)
MCHC: 34.3 g/dL (ref 30.0–36.0)
Monocytes Absolute: 0.7 10*3/uL (ref 0.1–1.0)
Neutro Abs: 3.5 10*3/uL (ref 1.7–7.7)
RDW: 11.8 % (ref 11.5–15.5)

## 2012-08-17 LAB — COMPREHENSIVE METABOLIC PANEL
AST: 46 U/L — ABNORMAL HIGH (ref 0–37)
Albumin: 3.6 g/dL (ref 3.5–5.2)
Calcium: 9.2 mg/dL (ref 8.4–10.5)
Creatinine, Ser: 0.61 mg/dL (ref 0.50–1.10)
Total Protein: 7.6 g/dL (ref 6.0–8.3)

## 2012-08-17 MED ORDER — HYDROMORPHONE HCL PF 1 MG/ML IJ SOLN
1.0000 mg | Freq: Once | INTRAMUSCULAR | Status: AC
  Administered 2012-08-17: 1 mg via INTRAVENOUS
  Filled 2012-08-17: qty 1

## 2012-08-17 MED ORDER — DIPHENHYDRAMINE HCL 50 MG/ML IJ SOLN
25.0000 mg | Freq: Once | INTRAMUSCULAR | Status: AC
  Administered 2012-08-17: 25 mg via INTRAVENOUS
  Filled 2012-08-17: qty 1

## 2012-08-17 MED ORDER — METOCLOPRAMIDE HCL 5 MG/ML IJ SOLN
10.0000 mg | Freq: Once | INTRAMUSCULAR | Status: AC
  Administered 2012-08-17: 10 mg via INTRAVENOUS
  Filled 2012-08-17: qty 2

## 2012-08-17 MED ORDER — SODIUM CHLORIDE 0.9 % IV BOLUS (SEPSIS)
1000.0000 mL | Freq: Once | INTRAVENOUS | Status: AC
  Administered 2012-08-17: 1000 mL via INTRAVENOUS

## 2012-08-17 MED ORDER — PROCHLORPERAZINE EDISYLATE 5 MG/ML IJ SOLN
10.0000 mg | Freq: Once | INTRAMUSCULAR | Status: DC
  Filled 2012-08-17: qty 2

## 2012-08-17 MED ORDER — HYDROMORPHONE HCL PF 1 MG/ML IJ SOLN: 1.0000 mg | Freq: Once | INTRAMUSCULAR | Status: DC

## 2012-08-17 NOTE — ED Notes (Signed)
Waiting on Normal Saline bolus to finish before discharging patient.

## 2012-08-17 NOTE — ED Provider Notes (Signed)
  Medical screening examination/treatment/procedure(s) were performed by non-physician practitioner and as supervising physician I was immediately available for consultation/collaboration.    Tameria Patti, MD 08/17/12 2151 

## 2012-08-17 NOTE — ED Notes (Signed)
Pt reports having an ERCP performed a couple of days ago. Was supposed to stay in the hospital for a couple of days but chose to leave a day early. States that she started having abdominal pain and nausea and vomiting up blood last night. Pt reports mid abdominal pain 10/10 and nausea. No signs of nausea or vomiting evident since arrival. Pt alert and oriented x 4, neuro intact.

## 2012-08-17 NOTE — ED Provider Notes (Signed)
History     CSN: [id number]  Arrival date & time 08/17/12  1600   First MD Initiated Contact with Patient 08/17/12 1614      Chief Complaint  Patient presents with  . Abdominal Pain    (Consider location/radiation/quality/duration/timing/severity/associated sxs/prior treatment) The history is provided by the patient and medical records.    [name] is a 32 y.o. female  with a hx of pancreatitis, drug seeking behavior, chronic abdominal painpresents to the Emergency Department complaining of gradual, persistent, progressively worsening epigastric pain onset last night. Patient states she had an ERCP done on Tuesday at [facility] and stayed one night. She states they wanted to keep her for an additional night but she refused because of the snow. Patient states she was told if she had increased pain she should return to the emergency department.  She is also complaining of bloody emesis beginning last night associated with increased epigastric pain. She states she is taking Dilaudid 2 mg by mouth at home without any relief.  Nothing makes it better and nothing makes it worse.  Pt denies fever, chills, headache, neck pain, chest pain, diarrhea, melena, syncope, dysuria.     Past Medical History  Diagnosis Date  . Pancreatitis   . S/P laparoscopic cholecystectomy   . Fibroids   . Ovarian cyst   . Chronic abdominal pain   . Anemia     Past Surgical History  Procedure Laterality Date  . Cholecystectomy    . Abdominal hysterectomy    . Dilation and curettage of uterus    . Ercp w/ metal stent placement      Family History  Problem Relation Age of Onset  . Diabetes Mother   . Hypertension Mother   . Hypertension Father   . Diabetes Father   . Asthma Daughter     History  Substance Use Topics  . Smoking status: Current Every Day Smoker -- 0.50 packs/day for 10 years    Types: Cigarettes  . Smokeless tobacco: Never Used  . Alcohol Use: No    OB History   Grav Para  Term Preterm Abortions TAB SAB Ect Mult Living   4 2 1 1 2  2          Review of Systems  Constitutional: Negative for fever, diaphoresis, appetite change, fatigue and unexpected weight change.  HENT: Negative for mouth sores, trouble swallowing, neck pain and neck stiffness.   Respiratory: Negative for cough, chest tightness, shortness of breath, wheezing and stridor.   Cardiovascular: Negative for chest pain and palpitations.  Gastrointestinal: Positive for nausea, vomiting and abdominal pain. Negative for diarrhea, constipation, blood in stool, abdominal distention and rectal pain.  Genitourinary: Negative for dysuria, urgency, frequency, hematuria, flank pain and difficulty urinating.  Musculoskeletal: Negative for back pain.  Skin: Negative for rash.  Neurological: Negative for weakness.  Hematological: Negative for adenopathy.  Psychiatric/Behavioral: Negative for confusion.  All other systems reviewed and are negative.    Allergies  Celecoxib; Fentanyl; Ibuprofen; Ketorolac tromethamine; Meperidine hcl; Morphine; Ondansetron; Shellfish allergy; Tramadol; Coconut flavor; Promethazine; Propoxyphene-acetaminophen; and Tylenol  Home Medications   Current Outpatient Rx  Name  Route  Sig  Dispense  Refill  . oxyCODONE (OXY IR/ROXICODONE) 5 MG immediate release tablet   Oral   Take 10 mg by mouth every 6 (six) hours as needed for pain.           BP 121/79  Pulse 88  Temp(Src) 98.5 F (36.9 C) (Oral)  Resp  14  SpO2 100%  LMP 02/04/2012  Physical Exam  Nursing note and vitals reviewed. Constitutional: She is oriented to person, place, and time. She appears well-developed and well-nourished. No distress.  HENT:  Head: Normocephalic and atraumatic.  Mouth/Throat: Uvula is midline, oropharynx is clear and moist and mucous membranes are normal. Mucous membranes are not dry and not cyanotic. No oropharyngeal exudate, posterior oropharyngeal edema, posterior oropharyngeal  erythema or tonsillar abscesses.  No blood noted in the oropharynx  Eyes: Conjunctivae are normal. Pupils are equal, round, and reactive to light. No scleral icterus.  Neck: Normal range of motion.  Cardiovascular: Normal rate, regular rhythm, normal heart sounds and intact distal pulses.  Exam reveals no gallop and no friction rub.   No murmur heard. Pulmonary/Chest: Effort normal and breath sounds normal. No respiratory distress. She has no wheezes. She has no rales. She exhibits no tenderness.  Abdominal: Soft. Bowel sounds are normal. She exhibits no distension and no mass. There is tenderness. There is guarding. There is no rebound.  Musculoskeletal: Normal range of motion. She exhibits no edema and no tenderness.  Lymphadenopathy:    She has no cervical adenopathy.  Neurological: She is alert and oriented to person, place, and time. No cranial nerve deficit. She exhibits normal muscle tone. Coordination normal.  Skin: Skin is warm and dry. No rash noted. She is not diaphoretic. No erythema. No pallor.  Psychiatric: She has a normal mood and affect.    ED Course  Procedures (including critical care time)  Labs Reviewed  CBC WITH DIFFERENTIAL - Abnormal; Notable for the following:    Eosinophils Relative 9 (*)    All other components within normal limits  COMPREHENSIVE METABOLIC PANEL - Abnormal; Notable for the following:    AST 46 (*)    ALT 68 (*)    Total Bilirubin 0.2 (*)    All other components within normal limits  LIPASE, BLOOD - Abnormal; Notable for the following:    Lipase 64 (*)    All other components within normal limits   Dg Abd Acute W/chest  08/17/2012  *RADIOLOGY REPORT*  Clinical Data: Abdominal pain.  Hematemesis.  Postop 2 days ago for pancreatitis.  ACUTE ABDOMEN SERIES (ABDOMEN 2 VIEW & CHEST 1 VIEW)  Comparison: 06/25/2012.  Findings: No infiltrate, congestive heart failure or pneumothorax. Mild thoracic scoliosis.  Heart size within normal limits.  Post  cholecystectomy.  Stent in place right upper quadrant.  Nonspecific bowel gas pattern without plain film evidence of bowel obstruction or free intraperitoneal air.  Mild to moderate amount stool throughout the colon.  IMPRESSION: Post cholecystectomy.  Stent in place right upper quadrant.  Nonspecific bowel gas pattern without plain film evidence of bowel obstruction or free intraperitoneal air.  Clear lungs.   Original Report Authenticated By: Lacy Duverney, M.D.      1. Chronic pancreatitis   2. Chronic abdominal pain   3. Nausea and vomiting   4. CHOLECYSTECTOMY, HX OF       MDM  Benjaman Kindler presents complaining of epigastric pain and bloody emesis 2 days postop.  Patient with mild epigastric tenderness, and no rigidity. The patient is afebrile, in no distress here in the department. She has not vomited here in the department and is requesting pain medications.  Labs with mildly elevated AST, ALT and lipase. No elevations in my blood cell count.  Acute abdomen with stent in place in the right upper quadrant but nonspecific bowel gas pattern without evidence  of bowel obstruction.  Patient is to be discharged home and encouraged followup with her surgeon.  She will not receive pain medication on discharge.  I have also discussed reasons to return immediately to the ER.  Patient expresses understanding and agrees with plan.  1. Medications: usual home medications 2. Treatment: rest, drink plenty of fluids 3. Follow Up: Please followup with your primary doctor for discussion of your diagnoses and further evaluation after today's visit; if you do not have a primary care doctor use the resource guide provided to find one      Dierdre Forth, PA-C 08/17/12 1826

## 2012-08-23 ENCOUNTER — Encounter (HOSPITAL_COMMUNITY): Payer: Self-pay | Admitting: Emergency Medicine

## 2012-08-23 DIAGNOSIS — R112 Nausea with vomiting, unspecified: Secondary | ICD-10-CM | POA: Insufficient documentation

## 2012-08-23 DIAGNOSIS — R109 Unspecified abdominal pain: Secondary | ICD-10-CM | POA: Insufficient documentation

## 2012-08-23 DIAGNOSIS — R197 Diarrhea, unspecified: Secondary | ICD-10-CM | POA: Insufficient documentation

## 2012-08-23 DIAGNOSIS — R1900 Intra-abdominal and pelvic swelling, mass and lump, unspecified site: Secondary | ICD-10-CM | POA: Insufficient documentation

## 2012-08-23 DIAGNOSIS — F17200 Nicotine dependence, unspecified, uncomplicated: Secondary | ICD-10-CM | POA: Insufficient documentation

## 2012-08-23 DIAGNOSIS — Z9889 Other specified postprocedural states: Secondary | ICD-10-CM | POA: Insufficient documentation

## 2012-08-23 NOTE — ED Notes (Addendum)
C/o upper abd pain, nausea, vomiting, diarrhea, and abd swelling x 5 days.  Reports she had surgery on pancreas (ERCP with metal stent placement) at [facility] on 2/11 and is suppose to see surgeon again in April.  States she has been taking pain medication without relief.

## 2012-08-24 ENCOUNTER — Emergency Department (HOSPITAL_COMMUNITY)
Admission: EM | Admit: 2012-08-24 | Discharge: 2012-08-24 | Discharge status: Against Medical Advice | Payer: Medicaid Other | Attending: Emergency Medicine | Admitting: Emergency Medicine

## 2012-08-24 NOTE — ED Notes (Signed)
Unable to locate pt in waiting area 

## 2012-08-24 NOTE — ED Notes (Signed)
Lab unable to locate pt in waiting room.

## 2012-08-24 NOTE — ED Notes (Signed)
Lab unable to locate pt at triage.

## 2012-08-29 ENCOUNTER — Encounter (HOSPITAL_COMMUNITY): Payer: Self-pay | Admitting: Adult Health

## 2012-08-29 DIAGNOSIS — Z9889 Other specified postprocedural states: Secondary | ICD-10-CM | POA: Insufficient documentation

## 2012-08-29 DIAGNOSIS — Z9089 Acquired absence of other organs: Secondary | ICD-10-CM | POA: Insufficient documentation

## 2012-08-29 DIAGNOSIS — Z8742 Personal history of other diseases of the female genital tract: Secondary | ICD-10-CM | POA: Insufficient documentation

## 2012-08-29 DIAGNOSIS — R109 Unspecified abdominal pain: Secondary | ICD-10-CM | POA: Insufficient documentation

## 2012-08-29 DIAGNOSIS — G8929 Other chronic pain: Secondary | ICD-10-CM | POA: Insufficient documentation

## 2012-08-29 DIAGNOSIS — Z8719 Personal history of other diseases of the digestive system: Secondary | ICD-10-CM | POA: Insufficient documentation

## 2012-08-29 DIAGNOSIS — Z862 Personal history of diseases of the blood and blood-forming organs and certain disorders involving the immune mechanism: Secondary | ICD-10-CM | POA: Insufficient documentation

## 2012-08-29 DIAGNOSIS — Z90710 Acquired absence of both cervix and uterus: Secondary | ICD-10-CM | POA: Insufficient documentation

## 2012-08-29 DIAGNOSIS — R112 Nausea with vomiting, unspecified: Secondary | ICD-10-CM | POA: Insufficient documentation

## 2012-08-29 DIAGNOSIS — F17200 Nicotine dependence, unspecified, uncomplicated: Secondary | ICD-10-CM | POA: Insufficient documentation

## 2012-08-29 LAB — CBC WITH DIFFERENTIAL/PLATELET
Basophils Relative: 1 % (ref 0–1)
Eosinophils Absolute: 0.6 10*3/uL (ref 0.0–0.7)
HCT: 37.4 % (ref 36.0–46.0)
Hemoglobin: 12.9 g/dL (ref 12.0–15.0)
MCH: 27.6 pg (ref 26.0–34.0)
MCHC: 34.5 g/dL (ref 30.0–36.0)
MCV: 79.9 fL (ref 78.0–100.0)
Monocytes Absolute: 0.7 10*3/uL (ref 0.1–1.0)
Monocytes Relative: 10 % (ref 3–12)

## 2012-08-29 LAB — COMPREHENSIVE METABOLIC PANEL
ALT: 43 U/L — ABNORMAL HIGH (ref 0–35)
AST: 33 U/L (ref 0–37)
Albumin: 3.8 g/dL (ref 3.5–5.2)
CO2: 23 mEq/L (ref 19–32)
Calcium: 9.2 mg/dL (ref 8.4–10.5)
Sodium: 138 mEq/L (ref 135–145)
Total Protein: 8.2 g/dL (ref 6.0–8.3)

## 2012-08-29 LAB — LIPASE, BLOOD: Lipase: 67 U/L — ABNORMAL HIGH (ref 11–59)

## 2012-08-29 NOTE — ED Notes (Signed)
Presents with abdominal pain and swelling that began 2 days ago, she is post stent placement in pancreatic duct from 08/15/2012, swelling became worse today. Abdominal pain is upper quadrant and radiates to back. Associated with nausea and vomiting and diarrhea. Abdomen is distended and tender to palpation.

## 2012-08-30 ENCOUNTER — Emergency Department (HOSPITAL_COMMUNITY): Payer: Medicaid Other

## 2012-08-30 ENCOUNTER — Emergency Department (HOSPITAL_COMMUNITY)
Admission: EM | Admit: 2012-08-30 | Discharge: 2012-08-30 | Disposition: A | Discharge status: Home / Self Care | Payer: Medicaid Other | Attending: Emergency Medicine | Admitting: Emergency Medicine

## 2012-08-30 DIAGNOSIS — R109 Unspecified abdominal pain: Secondary | ICD-10-CM

## 2012-08-30 MED ORDER — DIPHENHYDRAMINE HCL 25 MG PO CAPS
25.0000 mg | ORAL_CAPSULE | Freq: Once | ORAL | Status: DC
  Filled 2012-08-30: qty 1

## 2012-08-30 MED ORDER — HYDROMORPHONE HCL PF 2 MG/ML IJ SOLN
2.0000 mg | Freq: Once | INTRAMUSCULAR | Status: AC
  Administered 2012-08-30: 2 mg via INTRAMUSCULAR
  Filled 2012-08-30: qty 1

## 2012-08-30 MED ORDER — PROMETHAZINE HCL 25 MG/ML IJ SOLN
25.0000 mg | Freq: Once | INTRAMUSCULAR | Status: AC
  Administered 2012-08-30: 25 mg via INTRAMUSCULAR
  Filled 2012-08-30 (×2): qty 1

## 2012-08-30 NOTE — ED Provider Notes (Signed)
History     CSN: [id number]  Arrival date & time 08/29/12  2138   First MD Initiated Contact with Patient 08/30/12 0023      Chief Complaint  Patient presents with  . Abdominal Pain     The history is provided by the patient.   the patient reports 48 hours of abdominal pain with nausea and some vomiting.  She reports decreased ability to keep her pain medicine and nausea medicine down at home.  She is on chronic pain medicine at home as prescribed by her gastroenterologist.  She recently underwent pancreatic stent placement in outside hospital.  She has a long-standing history of chronic abdominal pain for which we've seen her 34 times in the last 6 months in this emergency department.  The patient denies hematemesis.  No melena or hematochezia.  Mild upper abdominal pain that is worse with movement and palpation.  No diarrhea.  No fevers chills.  No urinary symptoms  Past Medical History  Diagnosis Date  . Pancreatitis   . S/P laparoscopic cholecystectomy   . Fibroids   . Ovarian cyst   . Chronic abdominal pain   . Anemia     Past Surgical History  Procedure Laterality Date  . Cholecystectomy    . Abdominal hysterectomy    . Dilation and curettage of uterus    . Ercp w/ metal stent placement      Family History  Problem Relation Age of Onset  . Diabetes Mother   . Hypertension Mother   . Hypertension Father   . Diabetes Father   . Asthma Daughter     History  Substance Use Topics  . Smoking status: Current Every Day Smoker -- 0.50 packs/day for 10 years    Types: Cigarettes  . Smokeless tobacco: Never Used  . Alcohol Use: No    OB History   Grav Para Term Preterm Abortions TAB SAB Ect Mult Living   4 2 1 1 2  2          Review of Systems  Gastrointestinal: Positive for abdominal pain.  All other systems reviewed and are negative.    Allergies  Celecoxib; Fentanyl; Ibuprofen; Ketorolac tromethamine; Meperidine hcl; Morphine; Ondansetron; Shellfish  allergy; Tramadol; Coconut flavor; Propoxyphene-acetaminophen; and Tylenol  Home Medications   Current Outpatient Rx  Name  Route  Sig  Dispense  Refill  . diphenhydrAMINE (BENADRYL) 25 MG tablet   Oral   Take 50 mg by mouth every 6 (six) hours as needed for itching.         Marland Kitchen oxyCODONE (OXYCONTIN) 10 MG 12 hr tablet   Oral   Take 10 mg by mouth every 12 (twelve) hours. pain         . HYDROmorphone (DILAUDID) 2 MG tablet      2 mg. Take 1 tablet (2 mg total) by mouth every 6 (six) hours as needed for Pain (moderate pain).           BP 143/75  Pulse 80  Temp(Src) 98.1 F (36.7 C) (Oral)  Resp 18  SpO2 99%  LMP 08/19/2012  Physical Exam  Nursing note and vitals reviewed. Constitutional: She is oriented to person, place, and time. She appears well-developed and well-nourished. No distress.  HENT:  Head: Normocephalic and atraumatic.  Eyes: EOM are normal.  Neck: Normal range of motion.  Cardiovascular: Normal rate, regular rhythm and normal heart sounds.   Pulmonary/Chest: Effort normal and breath sounds normal.  Abdominal: Soft. She exhibits no  distension. There is no tenderness.  Musculoskeletal: Normal range of motion.  Neurological: She is alert and oriented to person, place, and time.  Skin: Skin is warm and dry.  Psychiatric: She has a normal mood and affect. Judgment normal.    ED Course  Procedures (including critical care time)  Labs Reviewed  LIPASE, BLOOD - Abnormal; Notable for the following:    Lipase 67 (*)    All other components within normal limits  COMPREHENSIVE METABOLIC PANEL - Abnormal; Notable for the following:    ALT 43 (*)    Total Bilirubin 0.2 (*)    All other components within normal limits  CBC WITH DIFFERENTIAL - Abnormal; Notable for the following:    Neutrophils Relative 39 (*)    Eosinophils Relative 9 (*)    All other components within normal limits  HCG, SERUM, QUALITATIVE   Dg Abd 2 Views  08/30/2012  *RADIOLOGY  REPORT*  Clinical Data: Left upper quadrant pain for 2 days.  Recent pancreatic stent.  ABDOMEN - 2 VIEW  Comparison: 08/17/2012  Findings: Surgical clips in the right upper quadrant.  The stent in the upper abdomen, likely pancreatic.  Location is stable since previous study.  Normal bowel gas pattern with scattered gas and stool in the colon.  No small or large bowel distension.  No free intra-abdominal air.  No abnormal air fluid levels.  No radiopaque stones.  Visualized bones appear intact.  No significant change since previous study.  IMPRESSION: The nonobstructive bowel gas pattern.  Postoperative changes and pancreatic stent appear stable.   Original Report Authenticated By: Burman Nieves, M.D.    I personally reviewed the imaging tests through PACS system I reviewed available ER/hospitalization records through the EMR   1. Abdominal pain       MDM  2:48 AM Patient feels better after pain medicine.  Plain films demonstrate a cut extended position.  This doesn't appear to be moving.  Lipase is without significant abnormality.  No free air.  Long-standing history of chronic abdominal pain.  PCP and gastroenterology followup.        Lyanne Co, MD 08/30/12 [phone]

## 2012-08-30 NOTE — ED Notes (Signed)
Pt presents with epigastric pain for the past 2 days rated 10/10.  Pt also admits to N/V/D for the past week, vomiting clear in color.  Pt placed into gown.

## 2012-08-30 NOTE — ED Notes (Signed)
Pt given a coke and crackers prior to discharge.  Pt family driving pt home.

## 2012-08-31 ENCOUNTER — Emergency Department (HOSPITAL_COMMUNITY)
Admission: EM | Admit: 2012-08-31 | Discharge: 2012-08-31 | Disposition: A | Discharge status: Home / Self Care | Payer: Medicaid Other | Attending: Emergency Medicine | Admitting: Emergency Medicine

## 2012-08-31 ENCOUNTER — Emergency Department (INDEPENDENT_AMBULATORY_CARE_PROVIDER_SITE_OTHER)
Admission: EM | Admit: 2012-08-31 | Discharge: 2012-08-31 | Disposition: A | Discharge status: Nursing Home (Includes ICF) | Payer: Medicaid Other | Source: Home / Self Care | Attending: Emergency Medicine | Admitting: Emergency Medicine

## 2012-08-31 ENCOUNTER — Encounter (HOSPITAL_COMMUNITY): Payer: Self-pay | Admitting: Emergency Medicine

## 2012-08-31 DIAGNOSIS — F17200 Nicotine dependence, unspecified, uncomplicated: Secondary | ICD-10-CM | POA: Insufficient documentation

## 2012-08-31 DIAGNOSIS — R197 Diarrhea, unspecified: Secondary | ICD-10-CM | POA: Insufficient documentation

## 2012-08-31 DIAGNOSIS — R6883 Chills (without fever): Secondary | ICD-10-CM | POA: Insufficient documentation

## 2012-08-31 DIAGNOSIS — R112 Nausea with vomiting, unspecified: Secondary | ICD-10-CM | POA: Insufficient documentation

## 2012-08-31 DIAGNOSIS — Z8719 Personal history of other diseases of the digestive system: Secondary | ICD-10-CM | POA: Insufficient documentation

## 2012-08-31 DIAGNOSIS — Z862 Personal history of diseases of the blood and blood-forming organs and certain disorders involving the immune mechanism: Secondary | ICD-10-CM | POA: Insufficient documentation

## 2012-08-31 DIAGNOSIS — Z90710 Acquired absence of both cervix and uterus: Secondary | ICD-10-CM | POA: Insufficient documentation

## 2012-08-31 DIAGNOSIS — Z9089 Acquired absence of other organs: Secondary | ICD-10-CM | POA: Insufficient documentation

## 2012-08-31 DIAGNOSIS — G8929 Other chronic pain: Secondary | ICD-10-CM | POA: Insufficient documentation

## 2012-08-31 DIAGNOSIS — Z8742 Personal history of other diseases of the female genital tract: Secondary | ICD-10-CM | POA: Insufficient documentation

## 2012-08-31 DIAGNOSIS — R109 Unspecified abdominal pain: Secondary | ICD-10-CM | POA: Insufficient documentation

## 2012-08-31 DIAGNOSIS — R1012 Left upper quadrant pain: Secondary | ICD-10-CM

## 2012-08-31 MED ORDER — HYDROMORPHONE HCL PF 2 MG/ML IJ SOLN
2.0000 mg | Freq: Once | INTRAMUSCULAR | Status: AC
  Administered 2012-08-31: 2 mg via INTRAMUSCULAR
  Filled 2012-08-31: qty 1

## 2012-08-31 MED ORDER — METOCLOPRAMIDE HCL 5 MG/ML IJ SOLN
10.0000 mg | Freq: Once | INTRAMUSCULAR | Status: AC
  Administered 2012-08-31: 10 mg via INTRAMUSCULAR
  Filled 2012-08-31: qty 2

## 2012-08-31 MED ORDER — SODIUM CHLORIDE 0.9 % IV BOLUS (SEPSIS): 1000.0000 mL | Freq: Once | INTRAVENOUS | Status: DC

## 2012-08-31 MED ORDER — SODIUM CHLORIDE 0.9 % IV BOLUS (SEPSIS): 1000.0000 mL | INTRAVENOUS | Status: DC

## 2012-08-31 NOTE — ED Provider Notes (Signed)
I saw and evaluated the patient, reviewed the resident's note and I agree with the findings and plan.  Pt with chronic abdominal pain and frequent ED visits for same. Reports continued symptoms for several weeks s/p pancreatic stent at [facility]. Seen here 36hrs ago for same with normal labs and AAS. No change from typical symptoms. Abd benign. No indication for repeat labs or imaging. Offered IM meds and outpatient followup.   Kingsley Farace B. Bernette Mayers, MD 08/31/12 [phone]

## 2012-08-31 NOTE — ED Notes (Signed)
Reports abdominal pain that started on 2/20.  Pain under left rib cage, pain in lower abdomen, bilaterally.  Pain in lower back, bilateral.  Denies pain with urination, last bm -today and described as diarrhea.  ?11 diarrhea stools, reports n/v and ?10 vomiting episodes.  Denies vaginal discharge

## 2012-08-31 NOTE — ED Notes (Signed)
Patient states recent pancreatic stent placed,patient states since Feb 20 increase in abdominal edema, patient with distended abdomen, states also with nausea and vomiting since Feb 20

## 2012-08-31 NOTE — ED Provider Notes (Signed)
Chief Complaint  Patient presents with  . Abdominal Pain    History of Present Illness:    [name] is a 32 year old female who presents today with a one-week history of gradually worsening left upper quadrant pain radiating to left lower quadrant and into the back. The pain is described as sharp and rated 10 over 10 in intensity. It's worse if she eats anything and not better with anything. The patient states she's had nausea and vomiting of all by mouth intake. No hematemesis, coffee-ground emesis, or bilious emesis. She's had chills and no appetite. She's not sure whether she's lost weight or not. She's also had loose stools but no hematochezia or melena. 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 The patient has been in the emergency room 3 times in the past month and has many emergency room visits over the years for the same thing. She underwent a a ERCP on February 11 with a stent placement because of recurrent pancreatitis. This was done at [facility] by Dr. Logan Bores. She has a long list of medication allergies and intolerances. She's taking Dilaudid and Benadryl for the pain. She has a history of anemia. She does smoke cigarettes. She was just at the emergency room last night and had a workup which included normal lipase and normal KUB of the abdomen.  Review of Systems:  Other than noted above, the patient denies any of the following symptoms: Constitutional:  No fever, chills, fatigue, weight loss or anorexia. Lungs:  No cough or shortness of breath. Heart:  No chest pain, palpitations, syncope or edema.  No cardiac history. Abdomen:  No nausea, vomiting, hematememesis, melena, diarrhea, or hematochezia. GU:  No dysuria, frequency, urgency, or hematuria. Gyn:  No vaginal discharge, itching, abnormal bleeding, dyspareunia, or pelvic pain.  PMFSH:  Past medical history, family history, social history, meds, and  allergies were reviewed along with nurse's notes.  No prior abdominal surgeries, past history of GI problems, STDs or GYN problems.  No history of aspirin or NSAID use.  No excessive alcohol intake.  Physical Exam:   Vital signs:  BP 122/78  Pulse 78  Temp(Src) 98.8 F (37.1 C) (Oral)  Resp 16  SpO2 98%  LMP 08/19/2012 Gen:  Alert, oriented, in no distress. Lungs:  Breath sounds clear and equal bilaterally.  No wheezes, rales or rhonchi. Heart:  Regular rhythm.  No gallops or murmers.   Abdomen:  Somewhat distended. Soft but with tenderness to palpation in the left upper quadrant and left lower quadrant without guarding or rebound. No organomegaly or mass. Bowel sounds are normally active. Skin:  Clear, warm and dry.  No rash.  Assessment:  The encounter diagnosis was LUQ pain.  Differential diagnosis is chronic pancreatitis versus partial small bowel obstruction.  Plan:   1.  The following meds were prescribed:   New Prescriptions   No medications on file   2.  The patient was transported to emergency department in stable condition via shuttle.  Medical Decision Making: 32 year old female has a 1 week history of LUQ pain radiating to back.  She rates the pain a 10.  She's also had nausea and vomiting of all PO intake.  She has been seen 3 times at the ED over the past 1 month, most recently last night when all blood work and KUB were normal. She had an ERCP and stent placement at [facility] by a  Dr. Logan Bores on 2/11 because of recurrent pancreatitis.  She is on Dilaudid and Benadryl at home.  Her exam reveals pain to palpation in LUQ without guarding or rebound and normal bowel sounds.  My impression is pancreatitis vs. Partial small bowel obstruction, and I think she needs further workup.     [name], MD 08/31/12 [phone]

## 2012-08-31 NOTE — ED Provider Notes (Signed)
History     CSN: [id number]  Arrival date & time 08/31/12  1430   First MD Initiated Contact with Patient 08/31/12 1500      Chief Complaint  Patient presents with  . Abdominal Pain    (Consider location/radiation/quality/duration/timing/severity/associated sxs/prior treatment) Patient is a 32 y.o. female presenting with abdominal pain. The history is provided by the patient. No language interpreter was used.  Abdominal Pain Pain location:  Epigastric, LUQ and suprapubic Pain quality: sharp and shooting   Pain radiates to:  Does not radiate Pain severity:  Severe Onset quality:  Gradual Duration:  1 week Timing:  Constant Progression:  Waxing and waning Chronicity:  Chronic Context: eating   Relieved by:  Nothing Worsened by:  Eating Ineffective treatments:  OTC medications Associated symptoms: chills, diarrhea, nausea and vomiting   Associated symptoms: no chest pain, no constipation, no cough, no dysuria, no fatigue, no fever, no shortness of breath and no sore throat   Diarrhea:    Quality:  Watery   Number of occurrences:  10 times daily   Severity:  Moderate   Duration:  1 week   Timing:  Intermittent   Progression:  Unchanged Vomiting:    Quality:  Stomach contents   Number of occurrences:  10 times daily   Severity:  Moderate   Duration:  1 week   Timing:  Intermittent   Progression:  Unchanged Risk factors: no alcohol abuse and not pregnant   Risk factors comment:  Pancreatitis   Past Medical History  Diagnosis Date  . Pancreatitis   . S/P laparoscopic cholecystectomy   . Fibroids   . Ovarian cyst   . Chronic abdominal pain   . Anemia     Past Surgical History  Procedure Laterality Date  . Cholecystectomy    . Abdominal hysterectomy    . Dilation and curettage of uterus    . Ercp w/ metal stent placement      Family History  Problem Relation Age of Onset  . Diabetes Mother   . Hypertension Mother   . Hypertension Father   . Diabetes  Father   . Asthma Daughter     History  Substance Use Topics  . Smoking status: Current Every Day Smoker -- 0.50 packs/day for 10 years    Types: Cigarettes  . Smokeless tobacco: Never Used  . Alcohol Use: No    OB History   Grav Para Term Preterm Abortions TAB SAB Ect Mult Living   4 2 1 1 2  2          Review of Systems  Constitutional: Positive for chills. Negative for fever, diaphoresis, activity change, appetite change and fatigue.  HENT: Negative for congestion, sore throat, facial swelling, rhinorrhea, neck pain and neck stiffness.   Eyes: Negative for photophobia and discharge.  Respiratory: Negative for cough, chest tightness and shortness of breath.   Cardiovascular: Negative for chest pain, palpitations and leg swelling.  Gastrointestinal: Positive for nausea, vomiting, abdominal pain and diarrhea. Negative for constipation.  Endocrine: Negative for polydipsia and polyuria.  Genitourinary: Negative for dysuria, frequency, difficulty urinating and pelvic pain.  Musculoskeletal: Negative for back pain and arthralgias.  Skin: Negative for color change and wound.  Allergic/Immunologic: Negative for immunocompromised state.  Neurological: Negative for facial asymmetry, weakness, numbness and headaches.  Hematological: Does not bruise/bleed easily.  Psychiatric/Behavioral: Negative for confusion and agitation.    Allergies  Celecoxib; Fentanyl; Ibuprofen; Ketorolac tromethamine; Meperidine hcl; Morphine; Ondansetron; Shellfish allergy; Tramadol; Coconut flavor;  Propoxyphene-acetaminophen; and Tylenol  Home Medications  No current outpatient prescriptions on file.  BP 117/67  Pulse 69  Temp(Src) 98.3 F (36.8 C) (Oral)  Resp 16  SpO2 100%  LMP 08/19/2012  Physical Exam  Constitutional: She is oriented to person, place, and time. She appears well-developed and well-nourished. No distress.  HENT:  Head: Normocephalic and atraumatic.  Mouth/Throat: No  oropharyngeal exudate.  Eyes: Pupils are equal, round, and reactive to light.  Neck: Normal range of motion. Neck supple.  Cardiovascular: Normal rate, regular rhythm and normal heart sounds.  Exam reveals no gallop and no friction rub.   No murmur heard. Pulmonary/Chest: Effort normal and breath sounds normal. No respiratory distress. She has no wheezes. She has no rales.  Abdominal: Soft. Bowel sounds are normal. She exhibits no distension and no mass. There is tenderness in the epigastric area, suprapubic area and left upper quadrant. There is no rigidity, no rebound, no guarding and no CVA tenderness.  Musculoskeletal: Normal range of motion. She exhibits no edema and no tenderness.  Neurological: She is alert and oriented to person, place, and time.  Skin: Skin is warm and dry.  Psychiatric: She has a normal mood and affect.    ED Course  Procedures (including critical care time)  Labs Reviewed - No data to display Dg Abd 2 Views  08/30/2012  *RADIOLOGY REPORT*  Clinical Data: Left upper quadrant pain for 2 days.  Recent pancreatic stent.  ABDOMEN - 2 VIEW  Comparison: 08/17/2012  Findings: Surgical clips in the right upper quadrant.  The stent in the upper abdomen, likely pancreatic.  Location is stable since previous study.  Normal bowel gas pattern with scattered gas and stool in the colon.  No small or large bowel distension.  No free intra-abdominal air.  No abnormal air fluid levels.  No radiopaque stones.  Visualized bones appear intact.  No significant change since previous study.  IMPRESSION: The nonobstructive bowel gas pattern.  Postoperative changes and pancreatic stent appear stable.   Original Report Authenticated By: Burman Nieves, M.D.      1. Chronic abdominal pain       MDM  Pt is a 32 y.o. female with pertinent PMHX of pancreatitis, chronic abdominal pain, ovarian cysts, who presents with about 7 days of epigastric/LUQ pain and low ab pain, n/v, d/a similar to  her prior episodes of pancreatitis per pt.  She has also had abdominal distension for about 1 week.  Pt was seen last night in ED for same, had unremarkable KUB, was tolerating PO after treatment with narcotics.  Pt sent from [facility] for further eval today.  On exam VSS, pt appears comfortable in room.  Cardiopulm exam benign. Abdomen moderately distended, but soft, w/ normal bowel sounds,  +ttp epigastrium, RUQ w/o rebound or guarding.  Pt has had 36 ED vists over past 6 months, in past 2 months, mostly for abdominal pain. There has been concern for drug seeking behavior in the past.  No vomiting or diarrhea since being sent by [facility].  Given exam, nml bowel sounds and frequent bowel movements, doubt SBO.  Pain likely chronic.   Pt does not appear dehydrated despite reports of frequent vomiting & diarrhea over past week.  I do not believe she needs IVF, repeat labs or imaging.    Have ordered IM dilaudid, reglan for symptomatic improvement.   Pt has f/u appointment w/ Dr. Logan Bores April 7th for repeat ERCP?  She has had had rx filled for #  120 percocet on 2/10 by PCP.  Will not given further narcotic rx here.     1. Chronic abdominal pain      Labs and imaging considered in decision making, reviewed by myself.  Imaging interpreted by radiology. Pt care discussed with my attending, Dr. Bernette Mayers.    Toy Cookey, MD 08/31/12 [phone]

## 2012-08-31 NOTE — ED Notes (Signed)
Pt from [facility] c/o generalized abd pain; pt sts N/V/D x 1 week; pt sts recently have pancreatic stent placed at [facility]

## 2012-09-07 ENCOUNTER — Encounter (HOSPITAL_COMMUNITY): Payer: Self-pay | Admitting: Emergency Medicine

## 2012-09-07 ENCOUNTER — Emergency Department (HOSPITAL_COMMUNITY)
Admission: EM | Admit: 2012-09-07 | Discharge: 2012-09-07 | Disposition: A | Discharge status: Home / Self Care | Payer: Medicaid Other | Attending: Emergency Medicine | Admitting: Emergency Medicine

## 2012-09-07 ENCOUNTER — Emergency Department (HOSPITAL_COMMUNITY): Payer: Medicaid Other

## 2012-09-07 DIAGNOSIS — K861 Other chronic pancreatitis: Secondary | ICD-10-CM | POA: Insufficient documentation

## 2012-09-07 DIAGNOSIS — Z862 Personal history of diseases of the blood and blood-forming organs and certain disorders involving the immune mechanism: Secondary | ICD-10-CM | POA: Insufficient documentation

## 2012-09-07 DIAGNOSIS — Z9889 Other specified postprocedural states: Secondary | ICD-10-CM | POA: Insufficient documentation

## 2012-09-07 DIAGNOSIS — F17200 Nicotine dependence, unspecified, uncomplicated: Secondary | ICD-10-CM | POA: Insufficient documentation

## 2012-09-07 DIAGNOSIS — R197 Diarrhea, unspecified: Secondary | ICD-10-CM | POA: Insufficient documentation

## 2012-09-07 DIAGNOSIS — Z8742 Personal history of other diseases of the female genital tract: Secondary | ICD-10-CM | POA: Insufficient documentation

## 2012-09-07 DIAGNOSIS — Z9089 Acquired absence of other organs: Secondary | ICD-10-CM | POA: Insufficient documentation

## 2012-09-07 DIAGNOSIS — R112 Nausea with vomiting, unspecified: Secondary | ICD-10-CM | POA: Insufficient documentation

## 2012-09-07 DIAGNOSIS — Z90710 Acquired absence of both cervix and uterus: Secondary | ICD-10-CM | POA: Insufficient documentation

## 2012-09-07 DIAGNOSIS — G8929 Other chronic pain: Secondary | ICD-10-CM | POA: Insufficient documentation

## 2012-09-07 LAB — COMPREHENSIVE METABOLIC PANEL
AST: 23 U/L (ref 0–37)
CO2: 24 mEq/L (ref 19–32)
Calcium: 9.5 mg/dL (ref 8.4–10.5)
Creatinine, Ser: 0.59 mg/dL (ref 0.50–1.10)
GFR calc Af Amer: >90 mL/min (ref >90)
GFR calc non Af Amer: >90 mL/min (ref >90)
Glucose, Bld: 92 mg/dL (ref 70–99)

## 2012-09-07 LAB — URINALYSIS, ROUTINE W REFLEX MICROSCOPIC
Glucose, UA: NEGATIVE mg/dL
Leukocytes, UA: NEGATIVE
Protein, ur: NEGATIVE mg/dL
pH: 7 (ref 5.0–8.0)

## 2012-09-07 LAB — CBC WITH DIFFERENTIAL/PLATELET
Hemoglobin: 13.1 g/dL (ref 12.0–15.0)
Lymphs Abs: 2.9 10*3/uL (ref 0.7–4.0)
Monocytes Relative: 6 % (ref 3–12)
Neutro Abs: 5.3 10*3/uL (ref 1.7–7.7)
Neutrophils Relative %: 58 % (ref 43–77)
RBC: 4.66 MIL/uL (ref 3.87–5.11)

## 2012-09-07 MED ORDER — METOCLOPRAMIDE HCL 5 MG/ML IJ SOLN
10.0000 mg | Freq: Once | INTRAMUSCULAR | Status: AC
  Administered 2012-09-07: 10 mg via INTRAVENOUS
  Filled 2012-09-07: qty 2

## 2012-09-07 MED ORDER — SODIUM CHLORIDE 0.9 % IV BOLUS (SEPSIS)
1000.0000 mL | Freq: Once | INTRAVENOUS | Status: AC
  Administered 2012-09-07: 1000 mL via INTRAVENOUS

## 2012-09-07 MED ORDER — HYDROMORPHONE HCL PF 1 MG/ML IJ SOLN
1.0000 mg | Freq: Once | INTRAMUSCULAR | Status: AC
  Administered 2012-09-07: 1 mg via INTRAVENOUS
  Filled 2012-09-07: qty 1

## 2012-09-07 MED ORDER — DIPHENHYDRAMINE HCL 50 MG/ML IJ SOLN
25.0000 mg | Freq: Once | INTRAMUSCULAR | Status: AC
  Administered 2012-09-07: 25 mg via INTRAVENOUS
  Filled 2012-09-07: qty 1

## 2012-09-07 NOTE — ED Notes (Signed)
Reviewed discharge instructions with patient.  Patient understood all instructions.

## 2012-09-07 NOTE — ED Notes (Signed)
Pt st's she had stent placed in pancreatis on 2/11.  Pt st's she has been having upper abd pain with diarrhea for past 6 days.  Also c/o nausea and vomiting.

## 2012-09-07 NOTE — ED Provider Notes (Addendum)
History     CSN: [id number]  Arrival date & time 09/07/12  1533   First MD Initiated Contact with Patient 09/07/12 1711      Chief Complaint  Patient presents with  . Abdominal Pain    (Consider location/radiation/quality/duration/timing/severity/associated sxs/prior treatment) HPI Pt with chronic abd pain and sees a GI at [facility]. States she has a f/u appointment on 4/6. She presents with upper abdominal pain radiating to back Past Medical History  Diagnosis Date  . Pancreatitis   . S/P laparoscopic cholecystectomy   . Fibroids   . Ovarian cyst   . Chronic abdominal pain   . Anemia     Past Surgical History  Procedure Laterality Date  . Cholecystectomy    . Abdominal hysterectomy    . Dilation and curettage of uterus    . Ercp w/ metal stent placement      Family History  Problem Relation Age of Onset  . Diabetes Mother   . Hypertension Mother   . Hypertension Father   . Diabetes Father   . Asthma Daughter     History  Substance Use Topics  . Smoking status: Current Every Day Smoker -- 0.50 packs/day for 10 years    Types: Cigarettes  . Smokeless tobacco: Never Used  . Alcohol Use: No    OB History   Grav Para Term Preterm Abortions TAB SAB Ect Mult Living   4 2 1 1 2  2          Review of Systems  Constitutional: Negative for fever and chills.  Respiratory: Negative for shortness of breath.   Cardiovascular: Negative for chest pain.  Gastrointestinal: Positive for nausea, vomiting, abdominal pain and diarrhea. Negative for blood in stool and rectal pain.  Skin: Negative for rash.  All other systems reviewed and are negative.    Allergies  Celecoxib; Fentanyl; Ibuprofen; Ketorolac tromethamine; Meperidine hcl; Morphine; Ondansetron; Shellfish allergy; Tramadol; Coconut flavor; Propoxyphene-acetaminophen; and Tylenol  Home Medications   Current Outpatient Rx  Name  Route  Sig  Dispense  Refill  . diphenhydrAMINE (BENADRYL) 12.5 MG/5ML elixir    Oral   Take 50 mg by mouth daily as needed for allergies. For itching         . HYDROmorphone (DILAUDID) 2 MG tablet   Oral   Take 2 mg by mouth every 6 (six) hours as needed for pain. For pain         . metoCLOPramide (REGLAN) 10 MG tablet   Oral   Take 10 mg by mouth 4 (four) times daily as needed. For nausea/vomiting           BP 111/76  Pulse 104  Temp(Src) 98.5 F (36.9 C) (Oral)  Resp 14  SpO2 97%  LMP 08/19/2012  Physical Exam  Nursing note and vitals reviewed. Constitutional: She is oriented to person, place, and time. She appears well-developed and well-nourished. No distress.  Pt is well appearing.   HENT:  Head: Normocephalic and atraumatic.  Mouth/Throat: Oropharynx is clear and moist.  Eyes: EOM are normal. Pupils are equal, round, and reactive to light.  Neck: Normal range of motion. Neck supple.  Cardiovascular: Normal rate and regular rhythm.   Pulmonary/Chest: Effort normal and breath sounds normal. No respiratory distress. She has no wheezes. She has no rales.  Abdominal: Soft. Bowel sounds are normal. She exhibits no distension and no mass. There is tenderness (mild epigastric tenderness, no rebound or guarding). There is no rebound and no guarding.  Musculoskeletal: Normal range of motion. She exhibits no edema and no tenderness.  Neurological: She is alert and oriented to person, place, and time.  Skin: Skin is warm and dry. No rash noted. No erythema.  Psychiatric: She has a normal mood and affect. Her behavior is normal.    ED Course  Procedures (including critical care time)  Labs Reviewed  COMPREHENSIVE METABOLIC PANEL - Abnormal; Notable for the following:    Total Bilirubin 0.2 (*)    All other components within normal limits  LIPASE, BLOOD - Abnormal; Notable for the following:    Lipase 184 (*)    All other components within normal limits  CBC WITH DIFFERENTIAL  URINALYSIS, ROUTINE W REFLEX MICROSCOPIC   Dg Abd Acute  W/chest  09/07/2012  *RADIOLOGY REPORT*  Clinical Data: Abdominal pain, nausea and vomiting.  ACUTE ABDOMEN SERIES (ABDOMEN 2 VIEW & CHEST 1 VIEW)  Comparison: Chest two views abdomen 08/17/2012.  Findings: Single view of the chest demonstrates clear lungs and normal heart size.  No pneumothorax or pleural effusion.  Two views of the abdomen show a normal bowel gas pattern.  There is no free intraperitoneal air.  Stent in the mid and left abdomen is likely pancreatic and unchanged.  IMPRESSION: No acute finding chest or abdomen.  Stable examination.   Original Report Authenticated By: Holley Dexter, M.D.      1. Chronic pancreatitis       MDM  Pt pain is improved. Advised to F/u with GI. Return for acute concerns        Loren Racer, MD 09/07/12 2257  Loren Racer, MD 10/03/12 [phone]

## 2012-09-07 NOTE — ED Notes (Signed)
2 attempts at [facility] with no success.  IV team paged.

## 2012-09-08 ENCOUNTER — Encounter (HOSPITAL_COMMUNITY): Payer: Self-pay | Admitting: Emergency Medicine

## 2012-09-08 ENCOUNTER — Emergency Department (HOSPITAL_COMMUNITY)
Admission: EM | Admit: 2012-09-08 | Discharge: 2012-09-09 | Disposition: A | Discharge status: Home / Self Care | Payer: Medicaid Other | Attending: Emergency Medicine | Admitting: Emergency Medicine

## 2012-09-08 DIAGNOSIS — R112 Nausea with vomiting, unspecified: Secondary | ICD-10-CM | POA: Insufficient documentation

## 2012-09-08 DIAGNOSIS — G8929 Other chronic pain: Secondary | ICD-10-CM | POA: Insufficient documentation

## 2012-09-08 DIAGNOSIS — Z9089 Acquired absence of other organs: Secondary | ICD-10-CM | POA: Insufficient documentation

## 2012-09-08 DIAGNOSIS — R109 Unspecified abdominal pain: Secondary | ICD-10-CM | POA: Insufficient documentation

## 2012-09-08 DIAGNOSIS — F17200 Nicotine dependence, unspecified, uncomplicated: Secondary | ICD-10-CM | POA: Insufficient documentation

## 2012-09-08 DIAGNOSIS — Z8719 Personal history of other diseases of the digestive system: Secondary | ICD-10-CM | POA: Insufficient documentation

## 2012-09-08 DIAGNOSIS — Z90710 Acquired absence of both cervix and uterus: Secondary | ICD-10-CM | POA: Insufficient documentation

## 2012-09-08 DIAGNOSIS — Z8742 Personal history of other diseases of the female genital tract: Secondary | ICD-10-CM | POA: Insufficient documentation

## 2012-09-08 DIAGNOSIS — Z862 Personal history of diseases of the blood and blood-forming organs and certain disorders involving the immune mechanism: Secondary | ICD-10-CM | POA: Insufficient documentation

## 2012-09-08 DIAGNOSIS — Z9889 Other specified postprocedural states: Secondary | ICD-10-CM | POA: Insufficient documentation

## 2012-09-08 DIAGNOSIS — L299 Pruritus, unspecified: Secondary | ICD-10-CM | POA: Insufficient documentation

## 2012-09-08 DIAGNOSIS — Z79899 Other long term (current) drug therapy: Secondary | ICD-10-CM | POA: Insufficient documentation

## 2012-09-08 LAB — CBC WITH DIFFERENTIAL/PLATELET
Basophils Absolute: 0.1 10*3/uL (ref 0.0–0.1)
HCT: 36.5 % (ref 36.0–46.0)
Hemoglobin: 13.1 g/dL (ref 12.0–15.0)
Lymphocytes Relative: 38 % (ref 12–46)
Monocytes Absolute: 0.7 10*3/uL (ref 0.1–1.0)
Neutro Abs: 3.9 10*3/uL (ref 1.7–7.7)
RDW: 11.8 % (ref 11.5–15.5)
WBC: 8.5 10*3/uL (ref 4.0–10.5)

## 2012-09-08 LAB — COMPREHENSIVE METABOLIC PANEL
ALT: 75 U/L — ABNORMAL HIGH (ref 0–35)
AST: 51 U/L — ABNORMAL HIGH (ref 0–37)
Alkaline Phosphatase: 99 U/L (ref 39–117)
CO2: 22 mEq/L (ref 19–32)
Chloride: 101 mEq/L (ref 96–112)
Creatinine, Ser: 0.72 mg/dL (ref 0.50–1.10)
GFR calc non Af Amer: >90 mL/min (ref >90)
Total Bilirubin: 0.1 mg/dL — ABNORMAL LOW (ref 0.3–1.2)

## 2012-09-08 MED ORDER — HYDROMORPHONE HCL PF 1 MG/ML IJ SOLN
1.0000 mg | Freq: Once | INTRAMUSCULAR | Status: AC
  Administered 2012-09-08: 1 mg via INTRAVENOUS
  Filled 2012-09-08: qty 1

## 2012-09-08 MED ORDER — DIPHENHYDRAMINE HCL 50 MG/ML IJ SOLN
25.0000 mg | Freq: Once | INTRAMUSCULAR | Status: AC
  Administered 2012-09-08: 25 mg via INTRAVENOUS
  Filled 2012-09-08: qty 1

## 2012-09-08 MED ORDER — HYDROMORPHONE HCL PF 1 MG/ML IJ SOLN: 1.0000 mg | Freq: Once | INTRAMUSCULAR | Status: DC

## 2012-09-08 MED ORDER — METOCLOPRAMIDE HCL 5 MG/ML IJ SOLN
10.0000 mg | Freq: Once | INTRAMUSCULAR | Status: AC
  Administered 2012-09-08: 10 mg via INTRAVENOUS
  Filled 2012-09-08: qty 2

## 2012-09-08 NOTE — ED Notes (Signed)
IV team returned page, will see.

## 2012-09-08 NOTE — ED Notes (Signed)
Pt returns ref. Mid to upper abd pain.  Pt was here last pm st's no better continues to have pain, nausea and vomiting

## 2012-09-08 NOTE — ED Provider Notes (Signed)
History     CSN: [id number]  Arrival date & time 09/08/12  2033   First MD Initiated Contact with Patient 09/08/12 2109      Chief Complaint  Patient presents with  . Abdominal Pain    (Consider location/radiation/quality/duration/timing/severity/associated sxs/prior treatment) HPI Comments: This is a 32 year old female, past medical history remarkable for chronic pancreatitis, who presents emergency department with chief complaints of abdominal pain. Patient was seen here last night for the same. She was treated with Dilaudid, Benadryl, and Reglan. Patient was feeling better, so she was discharged to home with return precautions. She states that the pain worsened this morning at around 5 AM. She states that she is moderate to severe upper abdominal pain. Additionally she endorses pruritis. She is tried taking her medications as prescribed, but still has pain. She states that she tried calling her surgeon, but they did not answer. She denies chest pain, shortness of breath, diarrhea, constipation, numbness and tingling of the extremities.  The history is provided by the patient. No language interpreter was used.    Past Medical History  Diagnosis Date  . Pancreatitis   . S/P laparoscopic cholecystectomy   . Fibroids   . Ovarian cyst   . Chronic abdominal pain   . Anemia     Past Surgical History  Procedure Laterality Date  . Cholecystectomy    . Abdominal hysterectomy    . Dilation and curettage of uterus    . Ercp w/ metal stent placement      Family History  Problem Relation Age of Onset  . Diabetes Mother   . Hypertension Mother   . Hypertension Father   . Diabetes Father   . Asthma Daughter     History  Substance Use Topics  . Smoking status: Current Every Day Smoker -- 0.50 packs/day for 10 years    Types: Cigarettes  . Smokeless tobacco: Never Used  . Alcohol Use: No    OB History   Grav Para Term Preterm Abortions TAB SAB Ect Mult Living   4 2 1 1 2  2           Review of Systems  All other systems reviewed and are negative.    Allergies  Celecoxib; Fentanyl; Ibuprofen; Ketorolac tromethamine; Meperidine hcl; Morphine; Ondansetron; Shellfish allergy; Tramadol; Coconut flavor; Propoxyphene-acetaminophen; and Tylenol  Home Medications   Current Outpatient Rx  Name  Route  Sig  Dispense  Refill  . diphenhydrAMINE (BENADRYL) 12.5 MG/5ML elixir   Oral   Take 50 mg by mouth daily as needed for allergies. For itching         . HYDROmorphone (DILAUDID) 2 MG tablet   Oral   Take 2 mg by mouth every 6 (six) hours as needed for pain.          Marland Kitchen metoCLOPramide (REGLAN) 10 MG tablet   Oral   Take 10 mg by mouth 4 (four) times daily as needed. For nausea/vomiting           BP 111/77  Pulse 95  Temp(Src) 98.2 F (36.8 C) (Oral)  Resp 16  SpO2 97%  LMP 08/19/2012  Physical Exam  Nursing note and vitals reviewed. Constitutional: She is oriented to person, place, and time. She appears well-developed and well-nourished.  HENT:  Head: Normocephalic and atraumatic.  Eyes: Conjunctivae and EOM are normal. Pupils are equal, round, and reactive to light.  Neck: Normal range of motion. Neck supple.  Cardiovascular: Normal rate and regular rhythm.  Exam  reveals no gallop and no friction rub.   No murmur heard. Pulmonary/Chest: Effort normal and breath sounds normal. No respiratory distress. She has no wheezes. She has no rales. She exhibits no tenderness.  Abdominal: Soft. Bowel sounds are normal. She exhibits no distension and no mass. There is tenderness. There is no rebound and no guarding.  Tenderness over the epigastric/right upper quadrant, no right lower quadrant tenderness or pain at McBurney's point, no rebound tenderness, no left lower quadrant tenderness, no fluid wave or signs of peritonitis  Musculoskeletal: Normal range of motion. She exhibits no edema and no tenderness.  Neurological: She is alert and oriented to person,  place, and time.  Skin: Skin is warm and dry.  Psychiatric: She has a normal mood and affect. Her behavior is normal. Judgment and thought content normal.    ED Course  Procedures (including critical care time)  Labs Reviewed  CBC WITH DIFFERENTIAL  BASIC METABOLIC PANEL  LIPASE, BLOOD   Dg Abd Acute W/chest  09/07/2012  *RADIOLOGY REPORT*  Clinical Data: Abdominal pain, nausea and vomiting.  ACUTE ABDOMEN SERIES (ABDOMEN 2 VIEW & CHEST 1 VIEW)  Comparison: Chest two views abdomen 08/17/2012.  Findings: Single view of the chest demonstrates clear lungs and normal heart size.  No pneumothorax or pleural effusion.  Two views of the abdomen show a normal bowel gas pattern.  There is no free intraperitoneal air.  Stent in the mid and left abdomen is likely pancreatic and unchanged.  IMPRESSION: No acute finding chest or abdomen.  Stable examination.   Original Report Authenticated By: Holley Dexter, M.D.      No diagnosis found.    MDM  32 year old female with abdominal pain. Patient has elevated lipase, it is increased compared to yesterday. Also increased LFTs. I discussed patient with Dr. Manus Gunning, who tells her that we should order a CT abdomen with patient.    1:10 AM Signed out to Dr. Rhunette Croft, who will continue care at this time.      Roxy Horseman, PA-C 09/09/12 0110

## 2012-09-08 NOTE — ED Notes (Addendum)
Unsuccessful PIV attempts x3, IV team paged.

## 2012-09-08 NOTE — ED Notes (Signed)
Attempted to gain IV access, unsuccessful. Another RN will try. Pt refusing lab draw at this time.

## 2012-09-08 NOTE — ED Notes (Signed)
unsuccessful PIV attempts x2.

## 2012-09-09 ENCOUNTER — Emergency Department (HOSPITAL_COMMUNITY): Payer: Medicaid Other

## 2012-09-09 MED ORDER — DIPHENHYDRAMINE HCL 50 MG/ML IJ SOLN
25.0000 mg | Freq: Once | INTRAMUSCULAR | Status: AC
  Administered 2012-09-09: 25 mg via INTRAVENOUS
  Filled 2012-09-09: qty 1

## 2012-09-09 MED ORDER — FAMOTIDINE IN NACL 20-0.9 MG/50ML-% IV SOLN
20.0000 mg | Freq: Once | INTRAVENOUS | Status: AC
  Administered 2012-09-09: 20 mg via INTRAVENOUS
  Filled 2012-09-09: qty 50

## 2012-09-09 MED ORDER — DIPHENHYDRAMINE HCL 50 MG/ML IJ SOLN
INTRAMUSCULAR | Status: AC
  Filled 2012-09-09: qty 1

## 2012-09-09 MED ORDER — IOHEXOL 300 MG/ML  SOLN
100.0000 mL | Freq: Once | INTRAMUSCULAR | Status: AC | PRN
  Administered 2012-09-09: 100 mL via INTRAVENOUS

## 2012-09-09 MED ORDER — DIPHENHYDRAMINE HCL 50 MG/ML IJ SOLN
25.0000 mg | Freq: Once | INTRAMUSCULAR | Status: AC
  Administered 2012-09-09: 25 mg via INTRAVENOUS

## 2012-09-09 MED ORDER — DIPHENHYDRAMINE HCL 25 MG PO CAPS: 25.0000 mg | ORAL_CAPSULE | Freq: Four times a day (QID) | ORAL | Status: DC | PRN

## 2012-09-09 MED ORDER — IOHEXOL 300 MG/ML  SOLN
20.0000 mL | INTRAMUSCULAR | Status: AC
  Administered 2012-09-09: 50 mL via ORAL

## 2012-09-09 MED ORDER — ONDANSETRON 8 MG PO TBDP: 8.0000 mg | ORAL_TABLET | Freq: Three times a day (TID) | ORAL | Status: DC | PRN

## 2012-09-09 MED ORDER — HYDROMORPHONE HCL 2 MG PO TABS
2.0000 mg | ORAL_TABLET | Freq: Once | ORAL | Status: AC
  Administered 2012-09-09: 2 mg via ORAL
  Filled 2012-09-09: qty 1

## 2012-09-09 MED ORDER — HYDROCODONE-ACETAMINOPHEN 5-325 MG PO TABS: 1.0000 | ORAL_TABLET | Freq: Four times a day (QID) | ORAL | Status: DC | PRN

## 2012-09-09 MED ORDER — PROMETHAZINE HCL 25 MG PO TABS: 25.0000 mg | ORAL_TABLET | Freq: Four times a day (QID) | ORAL | Status: DC | PRN

## 2012-09-09 MED ORDER — DEXAMETHASONE SODIUM PHOSPHATE 10 MG/ML IJ SOLN
10.0000 mg | Freq: Once | INTRAMUSCULAR | Status: AC
  Administered 2012-09-09: 10 mg via INTRAVENOUS
  Filled 2012-09-09: qty 1

## 2012-09-09 MED ORDER — HYDROMORPHONE HCL PF 1 MG/ML IJ SOLN
1.0000 mg | Freq: Once | INTRAMUSCULAR | Status: AC
  Administered 2012-09-09: 1 mg via INTRAVENOUS
  Filled 2012-09-09: qty 1

## 2012-09-09 MED ORDER — PROMETHAZINE HCL 25 MG RE SUPP: 25.0000 mg | Freq: Four times a day (QID) | RECTAL | Status: DC | PRN

## 2012-09-09 NOTE — ED Notes (Signed)
Pt started reporting some itching after contrast; pt states she normally drinks other contrast; Dr. Rhunette Croft made aware; verbal order given for 25mg  of Benadryl; airway intact;

## 2012-09-09 NOTE — ED Provider Notes (Signed)
Medical screening examination/treatment/procedure(s) were performed by non-physician practitioner and as supervising physician I was immediately available for consultation/collaboration.   Stephen Rancour, MD 09/09/12 1427 

## 2012-09-10 ENCOUNTER — Encounter (HOSPITAL_COMMUNITY): Payer: Self-pay | Admitting: Emergency Medicine

## 2012-09-10 ENCOUNTER — Emergency Department (HOSPITAL_COMMUNITY)
Admission: EM | Admit: 2012-09-10 | Discharge: 2012-09-10 | Disposition: A | Discharge status: Home / Self Care | Payer: Medicaid Other | Attending: Emergency Medicine | Admitting: Emergency Medicine

## 2012-09-10 DIAGNOSIS — R630 Anorexia: Secondary | ICD-10-CM | POA: Insufficient documentation

## 2012-09-10 DIAGNOSIS — K861 Other chronic pancreatitis: Secondary | ICD-10-CM | POA: Insufficient documentation

## 2012-09-10 DIAGNOSIS — F17200 Nicotine dependence, unspecified, uncomplicated: Secondary | ICD-10-CM | POA: Insufficient documentation

## 2012-09-10 DIAGNOSIS — Z8742 Personal history of other diseases of the female genital tract: Secondary | ICD-10-CM | POA: Insufficient documentation

## 2012-09-10 DIAGNOSIS — R197 Diarrhea, unspecified: Secondary | ICD-10-CM | POA: Insufficient documentation

## 2012-09-10 DIAGNOSIS — Z79899 Other long term (current) drug therapy: Secondary | ICD-10-CM | POA: Insufficient documentation

## 2012-09-10 DIAGNOSIS — K921 Melena: Secondary | ICD-10-CM | POA: Insufficient documentation

## 2012-09-10 DIAGNOSIS — R1110 Vomiting, unspecified: Secondary | ICD-10-CM | POA: Insufficient documentation

## 2012-09-10 DIAGNOSIS — Z90710 Acquired absence of both cervix and uterus: Secondary | ICD-10-CM | POA: Insufficient documentation

## 2012-09-10 DIAGNOSIS — Z862 Personal history of diseases of the blood and blood-forming organs and certain disorders involving the immune mechanism: Secondary | ICD-10-CM | POA: Insufficient documentation

## 2012-09-10 DIAGNOSIS — G8929 Other chronic pain: Secondary | ICD-10-CM | POA: Insufficient documentation

## 2012-09-10 DIAGNOSIS — R109 Unspecified abdominal pain: Secondary | ICD-10-CM | POA: Insufficient documentation

## 2012-09-10 DIAGNOSIS — Z9089 Acquired absence of other organs: Secondary | ICD-10-CM | POA: Insufficient documentation

## 2012-09-10 LAB — CBC WITH DIFFERENTIAL/PLATELET
Eosinophils Relative: 1 % (ref 0–5)
HCT: 39.4 % (ref 36.0–46.0)
Hemoglobin: 13.6 g/dL (ref 12.0–15.0)
Lymphocytes Relative: 43 % (ref 12–46)
Lymphs Abs: 4.8 10*3/uL — ABNORMAL HIGH (ref 0.7–4.0)
MCH: 28.2 pg (ref 26.0–34.0)
MCV: 81.7 fL (ref 78.0–100.0)
Monocytes Relative: 8 % (ref 3–12)
Platelets: 311 10*3/uL (ref 150–400)
RBC: 4.82 MIL/uL (ref 3.87–5.11)
WBC: 11.2 10*3/uL — ABNORMAL HIGH (ref 4.0–10.5)

## 2012-09-10 LAB — COMPREHENSIVE METABOLIC PANEL
ALT: 105 U/L — ABNORMAL HIGH (ref 0–35)
Alkaline Phosphatase: 107 U/L (ref 39–117)
BUN: 17 mg/dL (ref 6–23)
CO2: 23 mEq/L (ref 19–32)
Calcium: 9.2 mg/dL (ref 8.4–10.5)
GFR calc Af Amer: >90 mL/min (ref >90)
GFR calc non Af Amer: >90 mL/min (ref >90)
Glucose, Bld: 88 mg/dL (ref 70–99)
Sodium: 139 mEq/L (ref 135–145)

## 2012-09-10 LAB — LIPASE, BLOOD: Lipase: 82 U/L — ABNORMAL HIGH (ref 11–59)

## 2012-09-10 MED ORDER — HYDROMORPHONE HCL PF 1 MG/ML IJ SOLN
1.0000 mg | Freq: Once | INTRAMUSCULAR | Status: AC
  Administered 2012-09-10: 1 mg via INTRAVENOUS
  Filled 2012-09-10: qty 1

## 2012-09-10 MED ORDER — DIPHENHYDRAMINE HCL 50 MG/ML IJ SOLN
12.5000 mg | Freq: Once | INTRAMUSCULAR | Status: AC
  Administered 2012-09-10: 12.5 mg via INTRAVENOUS
  Filled 2012-09-10: qty 1

## 2012-09-10 MED ORDER — PROMETHAZINE HCL 25 MG/ML IJ SOLN
25.0000 mg | Freq: Once | INTRAMUSCULAR | Status: AC
  Administered 2012-09-10: 25 mg via INTRAVENOUS
  Filled 2012-09-10: qty 1

## 2012-09-10 MED ORDER — SODIUM CHLORIDE 0.9 % IV BOLUS (SEPSIS)
1000.0000 mL | Freq: Once | INTRAVENOUS | Status: AC
  Administered 2012-09-10: 1000 mL via INTRAVENOUS

## 2012-09-10 MED ORDER — PROMETHAZINE HCL 25 MG/ML IJ SOLN
12.5000 mg | Freq: Once | INTRAMUSCULAR | Status: AC
  Administered 2012-09-10: 12.5 mg via INTRAVENOUS
  Filled 2012-09-10: qty 1

## 2012-09-10 MED ORDER — DIPHENHYDRAMINE HCL 50 MG/ML IJ SOLN
25.0000 mg | Freq: Once | INTRAMUSCULAR | Status: AC
  Administered 2012-09-10: 25 mg via INTRAVENOUS
  Filled 2012-09-10: qty 1

## 2012-09-10 NOTE — ED Notes (Signed)
Pt requests to have labs drawn with IV start

## 2012-09-10 NOTE — ED Notes (Signed)
Pt c/o abd pain with N/V x 9 days; pt seen here x 2 for same; pt with hx of pancreatitis

## 2012-09-10 NOTE — ED Provider Notes (Addendum)
History     CSN: [id number]  Arrival date & time 09/10/12  Paulo Fruit   First MD Initiated Contact with Patient 09/10/12 2041      Chief Complaint  Patient presents with  . Abdominal Pain  . Emesis    (Consider location/radiation/quality/duration/timing/severity/associated sxs/prior treatment) Patient is a 32 y.o. female presenting with abdominal pain and vomiting. The history is provided by the patient.  Abdominal Pain Pain location:  LUQ and epigastric Pain quality: cramping, gnawing, sharp and shooting   Pain radiates to:  Back Pain severity:  Moderate Onset quality:  Gradual Duration:  9 days Timing:  Constant Progression:  Worsening Chronicity:  Chronic Context: eating, previous surgery and retching   Context: not alcohol use, not diet changes, not medication withdrawal, not sick contacts and not suspicious food intake   Relieved by:  Nothing (has tried dilaudid at home but persistent pain and vomiting) Worsened by:  Eating Associated symptoms: anorexia, diarrhea and vomiting   Associated symptoms: no chest pain, no cough, no fever, no hematemesis and no shortness of breath   Associated symptoms comment:  Bright red blood in stool without clots Risk factors: multiple surgeries   Risk factors comment:  Chronic pancreatitis and abd pain Emesis Associated symptoms: abdominal pain and diarrhea     Past Medical History  Diagnosis Date  . Pancreatitis   . S/P laparoscopic cholecystectomy   . Fibroids   . Ovarian cyst   . Chronic abdominal pain   . Anemia     Past Surgical History  Procedure Laterality Date  . Cholecystectomy    . Abdominal hysterectomy    . Dilation and curettage of uterus    . Ercp w/ metal stent placement      Family History  Problem Relation Age of Onset  . Diabetes Mother   . Hypertension Mother   . Hypertension Father   . Diabetes Father   . Asthma Daughter     History  Substance Use Topics  . Smoking status: Current Every Day Smoker  -- 0.50 packs/day for 10 years    Types: Cigarettes  . Smokeless tobacco: Never Used  . Alcohol Use: No    OB History   Grav Para Term Preterm Abortions TAB SAB Ect Mult Living   4 2 1 1 2  2          Review of Systems  Constitutional: Negative for fever.  Respiratory: Negative for cough and shortness of breath.   Cardiovascular: Negative for chest pain.  Gastrointestinal: Positive for vomiting, abdominal pain, diarrhea and anorexia. Negative for hematemesis.  All other systems reviewed and are negative.    Allergies  Celecoxib; Fentanyl; Ibuprofen; Ketorolac tromethamine; Meperidine hcl; Morphine; Ondansetron; Shellfish allergy; Tramadol; Coconut flavor; Propoxyphene-acetaminophen; and Tylenol  Home Medications   Current Outpatient Rx  Name  Route  Sig  Dispense  Refill  . diphenhydrAMINE (BENADRYL) 12.5 MG/5ML elixir   Oral   Take 50 mg by mouth daily as needed for allergies. For itching         . diphenhydrAMINE (BENADRYL) 25 mg capsule   Oral   Take 1 capsule (25 mg total) by mouth every 6 (six) hours as needed for itching.   30 capsule   0   . HYDROcodone-acetaminophen (NORCO/VICODIN) 5-325 MG per tablet   Oral   Take 1 tablet by mouth every 6 (six) hours as needed for pain.   15 tablet   0   . HYDROmorphone (DILAUDID) 2 MG tablet  Oral   Take 2 mg by mouth every 6 (six) hours as needed for pain.          Marland Kitchen metoCLOPramide (REGLAN) 10 MG tablet   Oral   Take 10 mg by mouth 4 (four) times daily as needed. For nausea/vomiting         . ondansetron (ZOFRAN ODT) 8 MG disintegrating tablet   Oral   Take 1 tablet (8 mg total) by mouth every 8 (eight) hours as needed for nausea.   20 tablet   0   . promethazine (PHENERGAN) 25 MG suppository   Rectal   Place 1 suppository (25 mg total) rectally every 6 (six) hours as needed for nausea.   12 each   0   . promethazine (PHENERGAN) 25 MG tablet   Oral   Take 1 tablet (25 mg total) by mouth every 6 (six)  hours as needed for nausea.   30 tablet   0     BP 142/78  Pulse 95  Temp(Src) 98.3 F (36.8 C) (Oral)  Resp 16  SpO2 96%  LMP 08/19/2012  Physical Exam  Nursing note and vitals reviewed. Constitutional: She is oriented to person, place, and time. She appears well-developed and well-nourished. No distress.  HENT:  Head: Normocephalic and atraumatic.  Mouth/Throat: Oropharynx is clear and moist.  Eyes: Conjunctivae and EOM are normal. Pupils are equal, round, and reactive to light.  Neck: Normal range of motion. Neck supple.  Cardiovascular: Normal rate, regular rhythm and intact distal pulses.   No murmur heard. Pulmonary/Chest: Effort normal and breath sounds normal. No respiratory distress. She has no wheezes. She has no rales.  Abdominal: Soft. She exhibits no distension. There is tenderness in the epigastric area and left upper quadrant. There is guarding. There is no rebound.  Musculoskeletal: Normal range of motion. She exhibits no edema and no tenderness.  Neurological: She is alert and oriented to person, place, and time.  Skin: Skin is warm and dry. No rash noted. No erythema.  Psychiatric: She has a normal mood and affect. Her behavior is normal.    ED Course  Procedures (including critical care time)  Labs Reviewed  CBC WITH DIFFERENTIAL - Abnormal; Notable for the following:    WBC 11.2 (*)    Lymphs Abs 4.8 (*)    All other components within normal limits  COMPREHENSIVE METABOLIC PANEL - Abnormal; Notable for the following:    AST 41 (*)    ALT 105 (*)    Total Bilirubin 0.2 (*)    All other components within normal limits  LIPASE, BLOOD - Abnormal; Notable for the following:    Lipase 82 (*)    All other components within normal limits   Ct Abdomen Pelvis W Contrast  09/09/2012  *RADIOLOGY REPORT*  Clinical Data: Mid to upper abdominal and back pain.  CT ABDOMEN AND PELVIS WITH CONTRAST  Technique:  Multidetector CT imaging of the abdomen and pelvis was  performed following the standard protocol during bolus administration of intravenous contrast.  Contrast: OMNIPAQUE IOHEXOL 300 MG/ML  SOLN  Comparison: CT of the abdomen and pelvis performed 12/15/2011  Findings: The visualized lung bases are clear.  There is mild stable distension of the intrahepatic biliary ducts, slightly more prominent at the left hepatic lobe.  The common hepatic duct is slightly distended, within normal limits status post cholecystectomy.  Clips are noted at the gallbladder fossa.  There has been interval placement of a pancreatic duct stent;  the pancreatic duct remains normal in caliber, and the stent is seen ending at the second segment of the duodenum, as expected.  The liver is otherwise unremarkable in appearance.  The spleen is within normal limits.  The pancreas and adrenal glands are otherwise unremarkable.  A 1.4 cm cyst is again noted at the interpole region of the right kidney.  The kidneys are otherwise unremarkable in appearance. There is no evidence of hydronephrosis.  No renal or ureteral stones are seen.  No perinephric stranding is appreciated.  No free fluid is identified.  The small bowel is unremarkable in appearance.  The stomach is within normal limits.  No acute vascular abnormalities are seen.  The appendix is normal in caliber and contains air, without evidence for appendicitis.  The colon is partially filled with stool and is unremarkable in appearance.  The bladder is mildly distended and grossly unremarkable.  The patient is status post hysterectomy; no suspicious adnexal masses are seen.  No inguinal lymphadenopathy is seen.  No acute osseous abnormalities are identified.  IMPRESSION:  1.  No acute abnormality seen within the abdomen or pelvis. 2.  Interval placement of pancreatic duct stent; the pancreatic duct is normal in caliber, and the stent is noted in expected position. 3.  Mild prominence of the intrahepatic biliary ducts remains within normal  limits status post cholecystectomy. 4.  Small right renal cyst again seen.   Original Report Authenticated By: Tonia Ghent, M.D.      1. Chronic abdominal pain   2. Chronic pancreatitis       MDM   Patient presenting today with worsening abdominal pain despite taking pain medication at home with persistent vomiting. She has been here repetitively in the past for abdominal pain. She does have a prior history of chronic pancreatitis and does have a pancreatic duct stent currently in place. She had a CT of her abdomen and pelvis done yesterday that showed no acute abnormalities have a lipase drawn on 09/08/2012 which was 227. Restoril labs at that time were within normal limits. She chose to go home with oral pain control and anti-medic however she's continued to vomit with pain at home. She states she has an appointment with Dr. Logan Bores at [facility] in April for a repeat scope and possible new placement of the stent. Patient given pain control and given recent normal CT scan did not feel she needs further imaging.  CBC, CMP, lipase pending  10:06 PM Today pt's labs are much improved.  Lipase is now down to 80.  LFT's remain mildly elevated.  Will d/c home as no vomiting here.      Gwyneth Sprout, MD 09/10/12 2206  Gwyneth Sprout, MD 09/10/12 2243

## 2012-09-14 ENCOUNTER — Encounter (HOSPITAL_COMMUNITY): Payer: Self-pay | Admitting: Emergency Medicine

## 2012-09-14 ENCOUNTER — Emergency Department (HOSPITAL_COMMUNITY)
Admission: EM | Admit: 2012-09-14 | Discharge: 2012-09-14 | Disposition: A | Discharge status: Home / Self Care | Payer: Medicaid Other | Attending: Emergency Medicine | Admitting: Emergency Medicine

## 2012-09-14 DIAGNOSIS — Z79899 Other long term (current) drug therapy: Secondary | ICD-10-CM | POA: Insufficient documentation

## 2012-09-14 DIAGNOSIS — Z8719 Personal history of other diseases of the digestive system: Secondary | ICD-10-CM | POA: Insufficient documentation

## 2012-09-14 DIAGNOSIS — Z8742 Personal history of other diseases of the female genital tract: Secondary | ICD-10-CM | POA: Insufficient documentation

## 2012-09-14 DIAGNOSIS — R110 Nausea: Secondary | ICD-10-CM | POA: Insufficient documentation

## 2012-09-14 DIAGNOSIS — Z862 Personal history of diseases of the blood and blood-forming organs and certain disorders involving the immune mechanism: Secondary | ICD-10-CM | POA: Insufficient documentation

## 2012-09-14 DIAGNOSIS — Z9889 Other specified postprocedural states: Secondary | ICD-10-CM | POA: Insufficient documentation

## 2012-09-14 DIAGNOSIS — F17200 Nicotine dependence, unspecified, uncomplicated: Secondary | ICD-10-CM | POA: Insufficient documentation

## 2012-09-14 DIAGNOSIS — G8929 Other chronic pain: Secondary | ICD-10-CM

## 2012-09-14 MED ORDER — METOCLOPRAMIDE HCL 5 MG/ML IJ SOLN: 10.0000 mg | Freq: Once | INTRAMUSCULAR | Status: DC

## 2012-09-14 NOTE — ED Notes (Signed)
PT. REPORTS PERSISTENT UPPER ABDOMINAL PAIN WITH VOMITTING AND DIARRHEA FOR SEVERAL WEEKS .

## 2012-09-15 NOTE — ED Provider Notes (Signed)
History     CSN: [id number]  Arrival date & time 09/14/12  0147   First MD Initiated Contact with Patient 09/14/12 0448      Chief Complaint  Patient presents with  . Abdominal Pain    (Consider location/radiation/quality/duration/timing/severity/associated sxs/prior treatment) Patient is a 32 y.o. female presenting with abdominal pain. The history is provided by the patient.  Abdominal Pain Pain location:  LUQ Pain quality: sharp   Pain radiates to:  Does not radiate Pain severity:  Moderate Onset quality:  Gradual Duration:  7 days Timing:  Constant Progression:  Unchanged Chronicity:  Chronic Context: not alcohol use, not diet changes, not recent travel, not sick contacts and not trauma   Relieved by:  Nothing Worsened by:  Nothing tried Ineffective treatments:  None tried Associated symptoms: nausea   Associated symptoms: no chest pain, no chills, no dysuria, no fever and no shortness of breath   Risk factors: not pregnant     Past Medical History  Diagnosis Date  . Pancreatitis   . S/P laparoscopic cholecystectomy   . Fibroids   . Ovarian cyst   . Chronic abdominal pain   . Anemia     Past Surgical History  Procedure Laterality Date  . Cholecystectomy    . Abdominal hysterectomy    . Dilation and curettage of uterus    . Ercp w/ metal stent placement      Family History  Problem Relation Age of Onset  . Diabetes Mother   . Hypertension Mother   . Hypertension Father   . Diabetes Father   . Asthma Daughter     History  Substance Use Topics  . Smoking status: Current Every Day Smoker -- 0.50 packs/day for 10 years    Types: Cigarettes  . Smokeless tobacco: Never Used  . Alcohol Use: No    OB History   Grav Para Term Preterm Abortions TAB SAB Ect Mult Living   4 2 1 1 2  2          Review of Systems  Constitutional: Negative for fever and chills.  HENT: Negative for neck pain and neck stiffness.   Eyes: Negative for pain.  Respiratory:  Negative for shortness of breath.   Cardiovascular: Negative for chest pain.  Gastrointestinal: Positive for nausea and abdominal pain.  Genitourinary: Negative for dysuria.  Musculoskeletal: Negative for back pain.  Skin: Negative for rash.  Neurological: Negative for headaches.  All other systems reviewed and are negative.    Allergies  Celecoxib; Fentanyl; Ibuprofen; Ketorolac tromethamine; Meperidine hcl; Morphine; Ondansetron; Shellfish allergy; Tramadol; Coconut flavor; Propoxyphene-acetaminophen; and Tylenol  Home Medications   Current Outpatient Rx  Name  Route  Sig  Dispense  Refill  . diphenhydrAMINE (BENADRYL) 12.5 MG/5ML elixir   Oral   Take 50 mg by mouth every 4 (four) hours as needed for itching or allergies. For itching         . HYDROmorphone (DILAUDID) 2 MG tablet   Oral   Take 2 mg by mouth every 6 (six) hours as needed for pain.          Marland Kitchen metoCLOPramide (REGLAN) 10 MG tablet   Oral   Take 10 mg by mouth 4 (four) times daily as needed. For nausea/vomiting         . promethazine (PHENERGAN) 25 MG tablet   Oral   Take 1 tablet (25 mg total) by mouth every 6 (six) hours as needed for nausea.   30 tablet  0     BP 123/74  Pulse 90  Temp(Src) 97.9 F (36.6 C) (Oral)  Resp 16  SpO2 98%  LMP 08/19/2012  Physical Exam  Constitutional: She is oriented to person, place, and time. She appears well-developed and well-nourished.  HENT:  Head: Normocephalic and atraumatic.  Eyes: EOM are normal. Pupils are equal, round, and reactive to light.  Neck: Neck supple.  Cardiovascular: Normal rate, regular rhythm and intact distal pulses.   Pulmonary/Chest: Effort normal and breath sounds normal. No respiratory distress.  Abdominal:  Mild LUQ TTP, otherwise soft ano no acute ABD  Musculoskeletal: Normal range of motion. She exhibits no edema.  Neurological: She is alert and oriented to person, place, and time.  Skin: Skin is warm and dry.    ED  Course  Procedures (including critical care time)  IM Reglan declined - PT tolerates POs - no indication IV Dilaudid as PT is requesting, she has Rx Dilaudid at home and scheduled f/u at [facility] for chronic pain  MDM  Chronic ABd pain, well known to this ER with multiple visits for chronic pain. Serial exams no acute ABD.  PT states she cant hold down her home meds, no emesis in ED, declines IM reglan, tolerates POs - stable for discharge        Sunnie Nielsen, MD 09/15/12 1050

## 2012-09-17 ENCOUNTER — Encounter (HOSPITAL_COMMUNITY): Payer: Self-pay | Admitting: *Deleted

## 2012-09-17 ENCOUNTER — Emergency Department (HOSPITAL_COMMUNITY)
Admission: EM | Admit: 2012-09-17 | Discharge: 2012-09-18 | Disposition: A | Discharge status: Other Acute Inpatient Hospital | Payer: Medicaid Other | Attending: Emergency Medicine | Admitting: Emergency Medicine

## 2012-09-17 DIAGNOSIS — Z862 Personal history of diseases of the blood and blood-forming organs and certain disorders involving the immune mechanism: Secondary | ICD-10-CM | POA: Insufficient documentation

## 2012-09-17 DIAGNOSIS — Z8719 Personal history of other diseases of the digestive system: Secondary | ICD-10-CM | POA: Insufficient documentation

## 2012-09-17 DIAGNOSIS — Z79899 Other long term (current) drug therapy: Secondary | ICD-10-CM | POA: Insufficient documentation

## 2012-09-17 DIAGNOSIS — R1110 Vomiting, unspecified: Secondary | ICD-10-CM | POA: Insufficient documentation

## 2012-09-17 DIAGNOSIS — Z8742 Personal history of other diseases of the female genital tract: Secondary | ICD-10-CM | POA: Insufficient documentation

## 2012-09-17 DIAGNOSIS — F17200 Nicotine dependence, unspecified, uncomplicated: Secondary | ICD-10-CM | POA: Insufficient documentation

## 2012-09-17 DIAGNOSIS — R109 Unspecified abdominal pain: Secondary | ICD-10-CM

## 2012-09-17 DIAGNOSIS — R197 Diarrhea, unspecified: Secondary | ICD-10-CM | POA: Insufficient documentation

## 2012-09-17 LAB — CBC WITH DIFFERENTIAL/PLATELET
Basophils Absolute: 0 10*3/uL (ref 0.0–0.1)
HCT: 37.6 % (ref 36.0–46.0)
Hemoglobin: 13.3 g/dL (ref 12.0–15.0)
Lymphocytes Relative: 39 % (ref 12–46)
Lymphs Abs: 2.6 10*3/uL (ref 0.7–4.0)
Monocytes Absolute: 0.7 10*3/uL (ref 0.1–1.0)
Monocytes Relative: 10 % (ref 3–12)
Neutro Abs: 2.9 10*3/uL (ref 1.7–7.7)
RBC: 4.76 MIL/uL (ref 3.87–5.11)
WBC: 6.6 10*3/uL (ref 4.0–10.5)

## 2012-09-17 LAB — COMPREHENSIVE METABOLIC PANEL
AST: 220 U/L — ABNORMAL HIGH (ref 0–37)
Albumin: 3.8 g/dL (ref 3.5–5.2)
BUN: 12 mg/dL (ref 6–23)
Creatinine, Ser: 0.68 mg/dL (ref 0.50–1.10)
Total Protein: 7.7 g/dL (ref 6.0–8.3)

## 2012-09-17 LAB — LIPASE, BLOOD: Lipase: 46 U/L (ref 11–59)

## 2012-09-17 MED ORDER — ONDANSETRON HCL 4 MG/2ML IJ SOLN
4.0000 mg | Freq: Once | INTRAMUSCULAR | Status: DC
  Filled 2012-09-17: qty 2

## 2012-09-17 MED ORDER — PROMETHAZINE HCL 25 MG/ML IJ SOLN
12.5000 mg | Freq: Once | INTRAMUSCULAR | Status: AC
  Administered 2012-09-17: 12.5 mg via INTRAVENOUS
  Filled 2012-09-17: qty 1

## 2012-09-17 MED ORDER — SODIUM CHLORIDE 0.9 % IV BOLUS (SEPSIS)
2000.0000 mL | Freq: Once | INTRAVENOUS | Status: AC
  Administered 2012-09-17: 2000 mL via INTRAVENOUS

## 2012-09-17 MED ORDER — HYDROMORPHONE HCL PF 2 MG/ML IJ SOLN
2.0000 mg | Freq: Once | INTRAMUSCULAR | Status: AC
  Administered 2012-09-17: 2 mg via INTRAVENOUS
  Filled 2012-09-17: qty 1

## 2012-09-17 NOTE — ED Notes (Signed)
IV team in room still at this time. IV team member attempted, unsuccessful. Another IV team member on way to attempt patient.

## 2012-09-17 NOTE — ED Notes (Signed)
Patient currently sitting up in bed; no respiratory or acute distress noted.  IV team was unable to get labs from IV line; lab called to obtain lab specimens.  Other than Bendaryl; patient denies any needs at this time; will continue to monitor.

## 2012-09-17 NOTE — ED Notes (Signed)
Patient currently sitting up in bed; no respiratory or acute distress noted.  Patient updated on plan of care; informed patient that we are currently waiting on IV team to come.  IV access attempted twice; unsuccessful.  IV team states that they are on their way.  Will continue to monitor.

## 2012-09-17 NOTE — ED Notes (Addendum)
C/o abd pain, also nvd, chills, onset ~ 3 weeks ago, r/t pancreatitis flare up, gradually progressively worse, pancreatic stent in place, placed at [facility], "think it is blocked, scheduled to redo stent 4/6". 10/10, no meds PTA.

## 2012-09-17 NOTE — ED Notes (Signed)
IV team at bedside 

## 2012-09-17 NOTE — ED Notes (Signed)
Patient requesting Benadryl; EDP notified.  Patient refusing Zofran; states that she is allergic to it.

## 2012-09-17 NOTE — ED Provider Notes (Addendum)
History     CSN: [id number]  Arrival date & time 09/17/12  4742   First MD Initiated Contact with Patient 09/17/12 2019      Chief Complaint  Patient presents with  . Abdominal Pain  . Emesis  . Diarrhea    (Consider location/radiation/quality/duration/timing/severity/associated sxs/prior treatment) HPI  complains of epigastric pain typical pancreatitis onset approximately 2.5 weeks ago with multiple episodes of vomiting and diarrhea today. She denies fever. He is treated self with Phenergan suppositories today but continues to vomit. And to have diarrhea. Pain feels like pancreatitis she's had in the past.. Nothing makes symptoms better or worse. Patient underwent ERCP 2 11 2014 with sphincterotomy performed. Patient has had 8 emergency department visits for abdominal pain since February 2014 Complains of epigastric pain  Past Medical History  Diagnosis Date  . Pancreatitis   . S/P laparoscopic cholecystectomy   . Fibroids   . Ovarian cyst   . Chronic abdominal pain   . Anemia    review of records from [facility], drug-seeking behavior, narcotic dependence  Past Surgical History  Procedure Laterality Date  . Cholecystectomy    . Abdominal hysterectomy    . Dilation and curettage of uterus    . Ercp w/ metal stent placement      Family History  Problem Relation Age of Onset  . Diabetes Mother   . Hypertension Mother   . Hypertension Father   . Diabetes Father   . Asthma Daughter     History  Substance Use Topics  . Smoking status: Current Every Day Smoker -- 0.50 packs/day for 10 years    Types: Cigarettes  . Smokeless tobacco: Never Used  . Alcohol Use: No    OB History   Grav Para Term Preterm Abortions TAB SAB Ect Mult Living   4 2 1 1 2  2          Review of Systems  Constitutional: Negative.   HENT: Negative.   Respiratory: Negative.   Cardiovascular: Negative.   Gastrointestinal: Positive for vomiting, abdominal pain and diarrhea.   Musculoskeletal: Negative.   Skin: Negative.   Neurological: Negative.   Psychiatric/Behavioral: Negative.   All other systems reviewed and are negative.    Allergies  Celecoxib; Fentanyl; Ibuprofen; Ketorolac tromethamine; Meperidine hcl; Morphine; Ondansetron; Shellfish allergy; Tramadol; Coconut flavor; Propoxyphene-acetaminophen; and Tylenol  Home Medications   Current Outpatient Rx  Name  Route  Sig  Dispense  Refill  . diphenhydrAMINE (BENADRYL) 12.5 MG/5ML elixir   Oral   Take 50 mg by mouth every 4 (four) hours as needed for itching or allergies. For itching         . metoCLOPramide (REGLAN) 10 MG tablet   Oral   Take 10 mg by mouth 4 (four) times daily as needed. For nausea/vomiting         . promethazine (PHENERGAN) 25 MG tablet   Oral   Take 1 tablet (25 mg total) by mouth every 6 (six) hours as needed for nausea.   30 tablet   0   . HYDROmorphone (DILAUDID) 2 MG tablet   Oral   Take 2 mg by mouth every 6 (six) hours as needed for pain.            BP 126/79  Pulse 98  Temp(Src) 97.7 F (36.5 C) (Oral)  Resp 16  SpO2 98%  LMP 08/19/2012  Physical Exam  Nursing note and vitals reviewed. Constitutional: She appears well-developed and well-nourished.  HENT:  Head:  Normocephalic and atraumatic.  Eyes: Conjunctivae are normal. Pupils are equal, round, and reactive to light.  Neck: Neck supple. No tracheal deviation present. No thyromegaly present.  Cardiovascular: Normal rate and regular rhythm.   No murmur heard. Pulmonary/Chest: Effort normal and breath sounds normal.  Abdominal: Soft. Bowel sounds are normal. She exhibits no distension and no mass. There is tenderness. There is no rebound and no guarding.  Epigastric tenderness  Musculoskeletal: Normal range of motion. She exhibits no edema and no tenderness.  Neurological: She is alert. Coordination normal.  Skin: Skin is warm and dry. No rash noted.  Psychiatric: She has a normal mood and  affect.    ED Course  Procedures (including critical care time)  Labs Reviewed  COMPREHENSIVE METABOLIC PANEL  LIPASE, BLOOD  CBC WITH DIFFERENTIAL  URINALYSIS, ROUTINE W REFLEX MICROSCOPIC   No results found.   No diagnosis found. At 1:15 AM pain is mildly improved after treatment with intravenous hydromorphone. Continues to complain of itching and nausea. Additional Phenergan and Benadryl ordered. Results for orders placed during the hospital encounter of 09/17/12  COMPREHENSIVE METABOLIC PANEL      Result Value Range   Sodium 136  135 - 145 mEq/L   Potassium 4.1  3.5 - 5.1 mEq/L   Chloride 102  96 - 112 mEq/L   CO2 24  19 - 32 mEq/L   Glucose, Bld 103 (*) 70 - 99 mg/dL   BUN 12  6 - 23 mg/dL   Creatinine, Ser 8.11  0.50 - 1.10 mg/dL   Calcium 9.4  8.4 - 91.4 mg/dL   Total Protein 7.7  6.0 - 8.3 g/dL   Albumin 3.8  3.5 - 5.2 g/dL   AST 782 (*) 0 - 37 U/L   ALT 320 (*) 0 - 35 U/L   Alkaline Phosphatase 117  39 - 117 U/L   Total Bilirubin 0.2 (*) 0.3 - 1.2 mg/dL   GFR calc non Af Amer >90  >90 mL/min   GFR calc Af Amer >90  >90 mL/min  LIPASE, BLOOD      Result Value Range   Lipase 46  11 - 59 U/L  CBC WITH DIFFERENTIAL      Result Value Range   WBC 6.6  4.0 - 10.5 K/uL   RBC 4.76  3.87 - 5.11 MIL/uL   Hemoglobin 13.3  12.0 - 15.0 g/dL   HCT 95.6  21.3 - 08.6 %   MCV 79.0  78.0 - 100.0 fL   MCH 27.9  26.0 - 34.0 pg   MCHC 35.4  30.0 - 36.0 g/dL   RDW 57.8  46.9 - 62.9 %   Platelets 274  150 - 400 K/uL   Neutrophils Relative 43  43 - 77 %   Neutro Abs 2.9  1.7 - 7.7 K/uL   Lymphocytes Relative 39  12 - 46 %   Lymphs Abs 2.6  0.7 - 4.0 K/uL   Monocytes Relative 10  3 - 12 %   Monocytes Absolute 0.7  0.1 - 1.0 K/uL   Eosinophils Relative 7 (*) 0 - 5 %   Eosinophils Absolute 0.5  0.0 - 0.7 K/uL   Basophils Relative 1  0 - 1 %   Basophils Absolute 0.0  0.0 - 0.1 K/uL   Ct Abdomen Pelvis W Contrast  09/09/2012  *RADIOLOGY REPORT*  Clinical Data: Mid to upper  abdominal and back pain.  CT ABDOMEN AND PELVIS WITH CONTRAST  Technique:  Multidetector CT  imaging of the abdomen and pelvis was performed following the standard protocol during bolus administration of intravenous contrast.  Contrast: OMNIPAQUE IOHEXOL 300 MG/ML  SOLN  Comparison: CT of the abdomen and pelvis performed 12/15/2011  Findings: The visualized lung bases are clear.  There is mild stable distension of the intrahepatic biliary ducts, slightly more prominent at the left hepatic lobe.  The common hepatic duct is slightly distended, within normal limits status post cholecystectomy.  Clips are noted at the gallbladder fossa.  There has been interval placement of a pancreatic duct stent; the pancreatic duct remains normal in caliber, and the stent is seen ending at the second segment of the duodenum, as expected.  The liver is otherwise unremarkable in appearance.  The spleen is within normal limits.  The pancreas and adrenal glands are otherwise unremarkable.  A 1.4 cm cyst is again noted at the interpole region of the right kidney.  The kidneys are otherwise unremarkable in appearance. There is no evidence of hydronephrosis.  No renal or ureteral stones are seen.  No perinephric stranding is appreciated.  No free fluid is identified.  The small bowel is unremarkable in appearance.  The stomach is within normal limits.  No acute vascular abnormalities are seen.  The appendix is normal in caliber and contains air, without evidence for appendicitis.  The colon is partially filled with stool and is unremarkable in appearance.  The bladder is mildly distended and grossly unremarkable.  The patient is status post hysterectomy; no suspicious adnexal masses are seen.  No inguinal lymphadenopathy is seen.  No acute osseous abnormalities are identified.  IMPRESSION:  1.  No acute abnormality seen within the abdomen or pelvis. 2.  Interval placement of pancreatic duct stent; the pancreatic duct is normal in  caliber, and the stent is noted in expected position. 3.  Mild prominence of the intrahepatic biliary ducts remains within normal limits status post cholecystectomy. 4.  Small right renal cyst again seen.   Original Report Authenticated By: Tonia Ghent, M.D.    Dg Abd 2 Views  08/30/2012  *RADIOLOGY REPORT*  Clinical Data: Left upper quadrant pain for 2 days.  Recent pancreatic stent.  ABDOMEN - 2 VIEW  Comparison: 08/17/2012  Findings: Surgical clips in the right upper quadrant.  The stent in the upper abdomen, likely pancreatic.  Location is stable since previous study.  Normal bowel gas pattern with scattered gas and stool in the colon.  No small or large bowel distension.  No free intra-abdominal air.  No abnormal air fluid levels.  No radiopaque stones.  Visualized bones appear intact.  No significant change since previous study.  IMPRESSION: The nonobstructive bowel gas pattern.  Postoperative changes and pancreatic stent appear stable.   Original Report Authenticated By: Burman Nieves, M.D.    Dg Abd Acute W/chest  09/07/2012  *RADIOLOGY REPORT*  Clinical Data: Abdominal pain, nausea and vomiting.  ACUTE ABDOMEN SERIES (ABDOMEN 2 VIEW & CHEST 1 VIEW)  Comparison: Chest two views abdomen 08/17/2012.  Findings: Single view of the chest demonstrates clear lungs and normal heart size.  No pneumothorax or pleural effusion.  Two views of the abdomen show a normal bowel gas pattern.  There is no free intraperitoneal air.  Stent in the mid and left abdomen is likely pancreatic and unchanged.  IMPRESSION: No acute finding chest or abdomen.  Stable examination.   Original Report Authenticated By: Holley Dexter, M.D.     I offered to transfer patient to [facility]  for definitive care which she refused MDM  I   sPoke with Dr. Alvester Morin who will come to evaluate patient in the emergency department. Diagnosis #1 abdominal pain # 2 nausea vomiting diarrhea #3 elevated liver function  tests        Doug Sou, MD 09/18/12 0118  Addendum Dr. Alvester Morin evaluate patient in the emergency department. He spoke with patient and convinced her that she is best served at [facility] where the procedure was performed. I spoke with Dr. Margaretha Glassing at [facility] who accepted patient in transfer  Doug Sou, MD 09/18/12 [phone]

## 2012-09-17 NOTE — ED Notes (Signed)
Patient complaining of chronic pancreatitis pain.  Was last seen here last week for the same symptoms; patient reports that she left AMA.  Patient reports that she had a pancreatic stent placed on February 11th and is going to have surgery again on April 6th for possible blocked stent.  Patient alert and oriented x4; PERRL present.  Will continue to monitor.

## 2012-09-18 ENCOUNTER — Telehealth: Payer: Self-pay | Admitting: Family Medicine

## 2012-09-18 DIAGNOSIS — R109 Unspecified abdominal pain: Secondary | ICD-10-CM

## 2012-09-18 MED ORDER — HYDROMORPHONE HCL PF 1 MG/ML IJ SOLN
1.0000 mg | Freq: Once | INTRAMUSCULAR | Status: AC
  Administered 2012-09-18: 1 mg via INTRAVENOUS
  Filled 2012-09-18: qty 1

## 2012-09-18 MED ORDER — PROMETHAZINE HCL 25 MG/ML IJ SOLN
12.5000 mg | Freq: Once | INTRAMUSCULAR | Status: AC
  Administered 2012-09-18: 12.5 mg via INTRAVENOUS
  Filled 2012-09-18: qty 1

## 2012-09-18 MED ORDER — DIPHENHYDRAMINE HCL 50 MG/ML IJ SOLN
25.0000 mg | Freq: Once | INTRAMUSCULAR | Status: AC
  Administered 2012-09-18: 25 mg via INTRAVENOUS
  Filled 2012-09-18: qty 1

## 2012-09-18 NOTE — ED Notes (Signed)
[facility] was called to transport pt. To [facility]

## 2012-09-18 NOTE — ED Notes (Signed)
Report given to Clydie Braun, RN from [place].  No further questions/concerns.  [facility] being paged at this time for transport.  Will continue to monitor.

## 2012-09-18 NOTE — ED Notes (Signed)
Patient laying on stretcher, no resp distress or acute distress noted. Patient complaining of pain, MD aware. Patient states the MD was discussing transfer to [facility], patient states she refuses to be transferred. Will continue to monitor patient.

## 2012-09-18 NOTE — ED Notes (Signed)
[facility] here to transfer patient to [facility].

## 2012-09-18 NOTE — ED Notes (Signed)
Patient currently sitting up in bed; no respiratory or acute distress noted.  Patient requesting more pain medication; admitting MD notified.  Patient denies any other needs at this time; will continue to monitor.

## 2012-09-18 NOTE — Consult Note (Signed)
Initially called for admission in this 32 year old female with past medical history of recurrent pancreatitis status post pancreatic stent placement at [facility] approximately one month ago. ED physician discussed transfer to [facility] for further management of this. Patient was apprehensive about transfer. Patient has had multiple ER visits for abdominal pain and pancreatic flares over the last month. Patient has not had followup with [facility] about this issue. Had a relatively lengthy discussion with patient about overall treatment options. Discussed with patient that pancreatic stent may likely need to be reevaluated. After extended discussion, patient is agreeable to transfer to [facility] for further evaluation of symptoms.  Lab Results  Component Value Date   WBC 6.6 09/17/2012   HGB 13.3 09/17/2012   HCT 37.6 09/17/2012   MCV 79.0 09/17/2012   PLT 274 09/17/2012   Lab Results  Component Value Date   NA 136 09/17/2012   K 4.1 09/17/2012   CL 102 09/17/2012   CO2 24 09/17/2012   Lab Results  Component Value Date   ALT 320* 09/17/2012   AST 220* 09/17/2012   ALKPHOS 117 09/17/2012   BILITOT 0.2* 09/17/2012     Filed Vitals:   09/18/12 0157  BP: 126/80  Pulse: 123  Temp:   Resp:    Gen: up in bed, drowsy s/p narcotics HEENT: NCAT, EOMI, TMs clear bilaterally CV: RRR, no murmurs auscultated PULM: CTAB, no wheezes, rales, rhoncii ABD: S/+ bowel sounds/ mild generalized abdominal tenderness.   EXT: 2+ peripheral pulses   Plan:  ED physician notified of patient's decision to be transferred to [facility]. Patient will be transferred from the ER to [facility] for further evaluation.

## 2012-09-18 NOTE — ED Notes (Signed)
Calling report to [facility] at this time [phone]).  Received call from [facility] in admissions; patient going to [employer].  Asked Diplomatic Services operational officer to fax copy of patient demographics sheet to [facility] admissions office (fax number [phone]), per admissions.

## 2012-09-18 NOTE — ED Notes (Signed)
Admitting MD at bedside.

## 2012-09-18 NOTE — ED Notes (Signed)
Dr. Jacubowitz at bedside 

## 2012-09-23 ENCOUNTER — Emergency Department (HOSPITAL_COMMUNITY)
Admission: EM | Admit: 2012-09-23 | Discharge: 2012-09-23 | Disposition: A | Discharge status: Home / Self Care | Payer: Medicaid Other | Attending: Emergency Medicine | Admitting: Emergency Medicine

## 2012-09-23 ENCOUNTER — Encounter (HOSPITAL_COMMUNITY): Payer: Self-pay | Admitting: *Deleted

## 2012-09-23 DIAGNOSIS — R110 Nausea: Secondary | ICD-10-CM | POA: Insufficient documentation

## 2012-09-23 DIAGNOSIS — G8929 Other chronic pain: Secondary | ICD-10-CM | POA: Insufficient documentation

## 2012-09-23 DIAGNOSIS — K861 Other chronic pancreatitis: Secondary | ICD-10-CM | POA: Insufficient documentation

## 2012-09-23 DIAGNOSIS — F17200 Nicotine dependence, unspecified, uncomplicated: Secondary | ICD-10-CM | POA: Insufficient documentation

## 2012-09-23 DIAGNOSIS — Z9089 Acquired absence of other organs: Secondary | ICD-10-CM | POA: Insufficient documentation

## 2012-09-23 DIAGNOSIS — Z90710 Acquired absence of both cervix and uterus: Secondary | ICD-10-CM | POA: Insufficient documentation

## 2012-09-23 DIAGNOSIS — Z8742 Personal history of other diseases of the female genital tract: Secondary | ICD-10-CM | POA: Insufficient documentation

## 2012-09-23 DIAGNOSIS — Z862 Personal history of diseases of the blood and blood-forming organs and certain disorders involving the immune mechanism: Secondary | ICD-10-CM | POA: Insufficient documentation

## 2012-09-23 LAB — CBC
MCV: 79.3 fL (ref 78.0–100.0)
Platelets: 287 10*3/uL (ref 150–400)
RDW: 11.6 % (ref 11.5–15.5)
WBC: 8.2 10*3/uL (ref 4.0–10.5)

## 2012-09-23 LAB — COMPREHENSIVE METABOLIC PANEL
AST: 20 U/L (ref 0–37)
BUN: 16 mg/dL (ref 6–23)
CO2: 24 mEq/L (ref 19–32)
Chloride: 105 mEq/L (ref 96–112)
Creatinine, Ser: 0.7 mg/dL (ref 0.50–1.10)
GFR calc non Af Amer: >90 mL/min (ref >90)
Glucose, Bld: 113 mg/dL — ABNORMAL HIGH (ref 70–99)
Total Bilirubin: 0.2 mg/dL — ABNORMAL LOW (ref 0.3–1.2)

## 2012-09-23 MED ORDER — OXYCODONE HCL 5 MG PO TABS
10.0000 mg | ORAL_TABLET | Freq: Once | ORAL | Status: AC
  Administered 2012-09-23: 10 mg via ORAL
  Filled 2012-09-23: qty 2

## 2012-09-23 NOTE — ED Notes (Signed)
Patient with abdominal pain for about three days.  States that pain has been getting worse.  Patient also c/o being nauseated.

## 2012-09-23 NOTE — ED Notes (Signed)
Pt c/o abd pain, pt in nad, ambulated to room with no issues. Resp e/u, skin warm and dry.

## 2012-09-23 NOTE — ED Provider Notes (Signed)
History     CSN: [id number]  Arrival date & time 09/23/12  2031   First MD Initiated Contact with Patient 09/23/12 2302      Chief Complaint  Patient presents with  . Abdominal Pain    (Consider location/radiation/quality/duration/timing/severity/associated sxs/prior treatment) Patient is a 32 y.o. female presenting with abdominal pain.  Abdominal Pain  Pt with chronic abdominal pain, numerous ED visits for same including several in the last 2 weeks. Also followed at [facility] where she had a pancreatic stent placed last month and then removed 4 days ago for continued pain. She states she is out of her pain medications despite two separate Rx filled in the last 2 weeks. No change from her usual pain.   Past Medical History  Diagnosis Date  . Pancreatitis   . S/P laparoscopic cholecystectomy   . Fibroids   . Ovarian cyst   . Chronic abdominal pain   . Anemia     Past Surgical History  Procedure Laterality Date  . Cholecystectomy    . Abdominal hysterectomy    . Dilation and curettage of uterus    . Ercp w/ metal stent placement      Family History  Problem Relation Age of Onset  . Diabetes Mother   . Hypertension Mother   . Hypertension Father   . Diabetes Father   . Asthma Daughter     History  Substance Use Topics  . Smoking status: Current Every Day Smoker -- 0.50 packs/day for 10 years    Types: Cigarettes  . Smokeless tobacco: Never Used  . Alcohol Use: No    OB History   Grav Para Term Preterm Abortions TAB SAB Ect Mult Living   4 2 1 1 2  2          Review of Systems All other systems reviewed and are negative except as noted in HPI.   Allergies  Celecoxib; Fentanyl; Ibuprofen; Ketorolac tromethamine; Meperidine hcl; Morphine; Ondansetron; Shellfish allergy; Tramadol; Coconut flavor; Propoxyphene-acetaminophen; and Tylenol  Home Medications   Current Outpatient Rx  Name  Route  Sig  Dispense  Refill  . diphenhydrAMINE (BENADRYL) 12.5 MG/5ML  elixir   Oral   Take 50 mg by mouth every 4 (four) hours as needed for itching or allergies. For itching         . HYDROmorphone (DILAUDID) 2 MG tablet   Oral   Take 2 mg by mouth every 6 (six) hours as needed for pain.          Marland Kitchen metoCLOPramide (REGLAN) 10 MG tablet   Oral   Take 10 mg by mouth 4 (four) times daily as needed. For nausea/vomiting         . oxyCODONE-acetaminophen (PERCOCET) 10-325 MG per tablet   Oral   Take 1 tablet by mouth every 4 (four) hours as needed for pain.           BP 127/79  Pulse 91  Temp(Src) 97.4 F (36.3 C) (Oral)  Resp 18  SpO2 97%  LMP 08/19/2012  Physical Exam  Nursing note and vitals reviewed. Constitutional: She is oriented to person, place, and time. She appears well-developed and well-nourished.  HENT:  Head: Normocephalic and atraumatic.  Eyes: EOM are normal. Pupils are equal, round, and reactive to light.  Neck: Normal range of motion. Neck supple.  Cardiovascular: Normal rate, normal heart sounds and intact distal pulses.   Pulmonary/Chest: Effort normal and breath sounds normal.  Abdominal: Bowel sounds are normal. She  exhibits no distension. There is tenderness (diffuse). There is no rebound and no guarding.  Musculoskeletal: Normal range of motion. She exhibits no edema and no tenderness.  Neurological: She is alert and oriented to person, place, and time. She has normal strength. No cranial nerve deficit or sensory deficit.  Skin: Skin is warm and dry. No rash noted.  Psychiatric: She has a normal mood and affect. Her behavior is normal.    ED Course  Procedures (including critical care time)  Labs Reviewed  COMPREHENSIVE METABOLIC PANEL - Abnormal; Notable for the following:    Glucose, Bld 113 (*)    ALT 60 (*)    Total Bilirubin 0.2 (*)    All other components within normal limits  LIPASE, BLOOD - Abnormal; Notable for the following:    Lipase 67 (*)    All other components within normal limits  CBC    No results found.   No diagnosis found.    MDM  Abd benign, labs ordered in triage unchanged from baseline. Advised outpatient followup. No Rx from the ED.        Charles B. Bernette Mayers, MD 09/23/12 2320

## 2012-09-27 ENCOUNTER — Emergency Department (HOSPITAL_COMMUNITY)
Admission: EM | Admit: 2012-09-27 | Discharge: 2012-09-27 | Disposition: A | Discharge status: Home / Self Care | Payer: Medicaid Other | Attending: Emergency Medicine | Admitting: Emergency Medicine

## 2012-09-27 ENCOUNTER — Encounter (HOSPITAL_COMMUNITY): Payer: Self-pay | Admitting: Emergency Medicine

## 2012-09-27 DIAGNOSIS — F17200 Nicotine dependence, unspecified, uncomplicated: Secondary | ICD-10-CM | POA: Insufficient documentation

## 2012-09-27 DIAGNOSIS — G8929 Other chronic pain: Secondary | ICD-10-CM | POA: Insufficient documentation

## 2012-09-27 DIAGNOSIS — R197 Diarrhea, unspecified: Secondary | ICD-10-CM | POA: Insufficient documentation

## 2012-09-27 DIAGNOSIS — Z8742 Personal history of other diseases of the female genital tract: Secondary | ICD-10-CM | POA: Insufficient documentation

## 2012-09-27 DIAGNOSIS — Z9889 Other specified postprocedural states: Secondary | ICD-10-CM | POA: Insufficient documentation

## 2012-09-27 DIAGNOSIS — Z3202 Encounter for pregnancy test, result negative: Secondary | ICD-10-CM | POA: Insufficient documentation

## 2012-09-27 DIAGNOSIS — Z8719 Personal history of other diseases of the digestive system: Secondary | ICD-10-CM | POA: Insufficient documentation

## 2012-09-27 DIAGNOSIS — Z862 Personal history of diseases of the blood and blood-forming organs and certain disorders involving the immune mechanism: Secondary | ICD-10-CM | POA: Insufficient documentation

## 2012-09-27 DIAGNOSIS — R112 Nausea with vomiting, unspecified: Secondary | ICD-10-CM | POA: Insufficient documentation

## 2012-09-27 LAB — URINALYSIS, ROUTINE W REFLEX MICROSCOPIC
Glucose, UA: NEGATIVE mg/dL
Hgb urine dipstick: NEGATIVE
Leukocytes, UA: NEGATIVE
pH: 6.5 (ref 5.0–8.0)

## 2012-09-27 LAB — CBC WITH DIFFERENTIAL/PLATELET
Basophils Absolute: 0 10*3/uL (ref 0.0–0.1)
Eosinophils Relative: 7 % — ABNORMAL HIGH (ref 0–5)
HCT: 33.3 % — ABNORMAL LOW (ref 36.0–46.0)
Hemoglobin: 12.2 g/dL (ref 12.0–15.0)
Lymphocytes Relative: 36 % (ref 12–46)
Lymphs Abs: 2.3 10*3/uL (ref 0.7–4.0)
MCV: 76.6 fL — ABNORMAL LOW (ref 78.0–100.0)
Monocytes Absolute: 0.6 10*3/uL (ref 0.1–1.0)
Monocytes Relative: 9 % (ref 3–12)
Neutro Abs: 3 10*3/uL (ref 1.7–7.7)
WBC: 6.4 10*3/uL (ref 4.0–10.5)

## 2012-09-27 LAB — COMPREHENSIVE METABOLIC PANEL
AST: 82 U/L — ABNORMAL HIGH (ref 0–37)
BUN: 14 mg/dL (ref 6–23)
CO2: 23 mEq/L (ref 19–32)
Calcium: 9.2 mg/dL (ref 8.4–10.5)
Chloride: 106 mEq/L (ref 96–112)
Creatinine, Ser: 0.63 mg/dL (ref 0.50–1.10)
GFR calc Af Amer: >90 mL/min (ref >90)
GFR calc non Af Amer: >90 mL/min (ref >90)
Glucose, Bld: 118 mg/dL — ABNORMAL HIGH (ref 70–99)
Total Bilirubin: 0.2 mg/dL — ABNORMAL LOW (ref 0.3–1.2)

## 2012-09-27 MED ORDER — HYDROMORPHONE HCL PF 1 MG/ML IJ SOLN
1.0000 mg | Freq: Once | INTRAMUSCULAR | Status: AC
  Administered 2012-09-27: 1 mg via INTRAMUSCULAR
  Filled 2012-09-27: qty 1

## 2012-09-27 NOTE — ED Provider Notes (Signed)
History     CSN: [id number]  Arrival date & time 09/27/12  2000   First MD Initiated Contact with Patient 09/27/12 2020      Chief Complaint  Patient presents with  . Abdominal Pain   HPI  History provided by the patient and recent medical charts. Patient is a 32 year old female with history of chronic abdominal pain previous pancreatitis, cholecystectomy, abdominal hysterectomy and recent ERCP with pancreatic stents who presents with complaints of continued abdominal pain with occasional nausea vomiting and diarrhea symptoms. Patient has frequent multiple visits to emergency room for similar complaints. She was seen 4 days ago for the similar without any significant or concerning findings. Patient was recently admitted to [facility] where she was treated by her GI specialist with a pancreatic stent. She then developed acute on chronic pancreatitis 2 weeks ago and subsequently had her stent removed. Since that time she was initially feeling well but last week began to have some of her chronic pains return with some episodes of nausea vomiting diarrhea. She still has been able to occasionally eat and drink. She also reports running out of her home pain medications. Was previously on home by mouth Dilaudid. Denies any other changes in her chronic symptoms. There is no associated fever, chills or sweats.  diarrhea soft stools without blood or mucus.      Past Medical History  Diagnosis Date  . Pancreatitis   . S/P laparoscopic cholecystectomy   . Fibroids   . Ovarian cyst   . Chronic abdominal pain   . Anemia     Past Surgical History  Procedure Laterality Date  . Cholecystectomy    . Abdominal hysterectomy    . Dilation and curettage of uterus    . Ercp w/ metal stent placement      Family History  Problem Relation Age of Onset  . Diabetes Mother   . Hypertension Mother   . Hypertension Father   . Diabetes Father   . Asthma Daughter     History  Substance Use Topics   . Smoking status: Current Every Day Smoker -- 0.50 packs/day for 10 years    Types: Cigarettes  . Smokeless tobacco: Never Used  . Alcohol Use: No    OB History   Grav Para Term Preterm Abortions TAB SAB Ect Mult Living   4 2 1 1 2  2          Review of Systems  Constitutional: Negative for fever, chills and diaphoresis.  Cardiovascular: Negative for chest pain.  Gastrointestinal: Positive for nausea, vomiting, abdominal pain and diarrhea. Negative for blood in stool.  Genitourinary: Negative for dysuria, frequency, hematuria, flank pain, vaginal bleeding and vaginal discharge.  All other systems reviewed and are negative.    Allergies  Celecoxib; Fentanyl; Ibuprofen; Ketorolac tromethamine; Meperidine hcl; Morphine; Ondansetron; Shellfish allergy; Tramadol; Coconut flavor; Propoxyphene-acetaminophen; and Tylenol  Home Medications  No current outpatient prescriptions on file.  BP 155/70  Pulse 90  Temp(Src) 98.1 F (36.7 C) (Oral)  SpO2 100%  LMP 08/19/2012  Physical Exam  Nursing note and vitals reviewed. Constitutional: She is oriented to person, place, and time. She appears well-developed and well-nourished. No distress.  HENT:  Head: Normocephalic.  Cardiovascular: Normal rate and regular rhythm.   Pulmonary/Chest: Effort normal and breath sounds normal. No respiratory distress. She has no wheezes.  Abdominal: Soft. She exhibits no distension. There is tenderness in the right lower quadrant and epigastric area. There is no rebound, no guarding and  no CVA tenderness.  Musculoskeletal: Normal range of motion.  Neurological: She is alert and oriented to person, place, and time.  Skin: Skin is warm and dry. No rash noted.  Psychiatric: She has a normal mood and affect. Her behavior is normal.    ED Course  Procedures   Results for orders placed during the hospital encounter of 09/27/12  AMYLASE      Result Value Range   Amylase 144 (*) 0 - 105 U/L  LIPASE,  BLOOD      Result Value Range   Lipase 54  11 - 59 U/L  URINALYSIS, ROUTINE W REFLEX MICROSCOPIC      Result Value Range   Color, Urine YELLOW  YELLOW   APPearance CLOUDY (*) CLEAR   Specific Gravity, Urine 1.031 (*) 1.005 - 1.030   pH 6.5  5.0 - 8.0   Glucose, UA NEGATIVE  NEGATIVE mg/dL   Hgb urine dipstick NEGATIVE  NEGATIVE   Bilirubin Urine NEGATIVE  NEGATIVE   Ketones, ur NEGATIVE  NEGATIVE mg/dL   Protein, ur NEGATIVE  NEGATIVE mg/dL   Urobilinogen, UA 1.0  0.0 - 1.0 mg/dL   Nitrite NEGATIVE  NEGATIVE   Leukocytes, UA NEGATIVE  NEGATIVE  CBC WITH DIFFERENTIAL      Result Value Range   WBC 6.4  4.0 - 10.5 K/uL   RBC 4.35  3.87 - 5.11 MIL/uL   Hemoglobin 12.2  12.0 - 15.0 g/dL   HCT 56.2 (*) 13.0 - 86.5 %   MCV 76.6 (*) 78.0 - 100.0 fL   MCH 28.0  26.0 - 34.0 pg   MCHC 36.6 (*) 30.0 - 36.0 g/dL   RDW 78.4  69.6 - 29.5 %   Platelets 194  150 - 400 K/uL   Neutrophils Relative 47  43 - 77 %   Neutro Abs 3.0  1.7 - 7.7 K/uL   Lymphocytes Relative 36  12 - 46 %   Lymphs Abs 2.3  0.7 - 4.0 K/uL   Monocytes Relative 9  3 - 12 %   Monocytes Absolute 0.6  0.1 - 1.0 K/uL   Eosinophils Relative 7 (*) 0 - 5 %   Eosinophils Absolute 0.5  0.0 - 0.7 K/uL   Basophils Relative 1  0 - 1 %   Basophils Absolute 0.0  0.0 - 0.1 K/uL  COMPREHENSIVE METABOLIC PANEL      Result Value Range   Sodium 142  135 - 145 mEq/L   Potassium 3.6  3.5 - 5.1 mEq/L   Chloride 106  96 - 112 mEq/L   CO2 23  19 - 32 mEq/L   Glucose, Bld 118 (*) 70 - 99 mg/dL   BUN 14  6 - 23 mg/dL   Creatinine, Ser 2.84  0.50 - 1.10 mg/dL   Calcium 9.2  8.4 - 13.2 mg/dL   Total Protein 7.3  6.0 - 8.3 g/dL   Albumin 3.6  3.5 - 5.2 g/dL   AST 82 (*) 0 - 37 U/L   ALT 186 (*) 0 - 35 U/L   Alkaline Phosphatase 135 (*) 39 - 117 U/L   Total Bilirubin 0.2 (*) 0.3 - 1.2 mg/dL   GFR calc non Af Amer >90  >90 mL/min   GFR calc Af Amer >90  >90 mL/min  POCT PREGNANCY, URINE      Result Value Range   Preg Test, Ur NEGATIVE   NEGATIVE        1. Chronic pain  MDM  9:00 PM patient seen and evaluated. Patient sitting in bed with her daughter appears comfortable in no acute distress. She is not exhibit significant tenderness on abdominal exam.  Patient with multiple and frequent visits to emergency room for chronic abdominal pains. Will check lab tests if unchanged from most recent visit she may be discharged for outpatient followup. No indications for imaging at this time.  Patient continues to appear well and no significant discomfort. Labs without any significant changes. She is stable for discharge home.      Angus Seller, PA-C 09/27/12 2152

## 2012-09-27 NOTE — ED Provider Notes (Signed)
Medical screening examination/treatment/procedure(s) were performed by non-physician practitioner and as supervising physician I was immediately available for consultation/collaboration.  Juliet Rude. Rubin Payor, MD 09/27/12 2348

## 2012-09-27 NOTE — ED Notes (Signed)
Pt states upper abdominal pain flare up since last Friday. N/V/D x 5 in last 24 hrs.

## 2012-09-28 ENCOUNTER — Encounter (HOSPITAL_COMMUNITY): Payer: Self-pay | Admitting: *Deleted

## 2012-09-28 DIAGNOSIS — Z862 Personal history of diseases of the blood and blood-forming organs and certain disorders involving the immune mechanism: Secondary | ICD-10-CM | POA: Insufficient documentation

## 2012-09-28 DIAGNOSIS — K859 Acute pancreatitis without necrosis or infection, unspecified: Secondary | ICD-10-CM | POA: Insufficient documentation

## 2012-09-28 DIAGNOSIS — Z90710 Acquired absence of both cervix and uterus: Secondary | ICD-10-CM | POA: Insufficient documentation

## 2012-09-28 DIAGNOSIS — F17200 Nicotine dependence, unspecified, uncomplicated: Secondary | ICD-10-CM | POA: Insufficient documentation

## 2012-09-28 DIAGNOSIS — Z3202 Encounter for pregnancy test, result negative: Secondary | ICD-10-CM | POA: Insufficient documentation

## 2012-09-28 DIAGNOSIS — Z9089 Acquired absence of other organs: Secondary | ICD-10-CM | POA: Insufficient documentation

## 2012-09-28 DIAGNOSIS — Z8742 Personal history of other diseases of the female genital tract: Secondary | ICD-10-CM | POA: Insufficient documentation

## 2012-09-28 NOTE — ED Notes (Signed)
Pt c/o RLQ abdominal pain since last night.  States she was seen last night and d/c with pancreatitis.  Worried about appendicitis.  States she has n/v/d

## 2012-09-29 ENCOUNTER — Emergency Department (HOSPITAL_COMMUNITY)
Admission: EM | Admit: 2012-09-29 | Discharge: 2012-09-29 | Disposition: A | Discharge status: Home / Self Care | Payer: Medicaid Other | Attending: Emergency Medicine | Admitting: Emergency Medicine

## 2012-09-29 DIAGNOSIS — K859 Acute pancreatitis without necrosis or infection, unspecified: Secondary | ICD-10-CM

## 2012-09-29 LAB — COMPREHENSIVE METABOLIC PANEL
ALT: 118 U/L — ABNORMAL HIGH (ref 0–35)
AST: 37 U/L (ref 0–37)
Albumin: 3.5 g/dL (ref 3.5–5.2)
Calcium: 8.8 mg/dL (ref 8.4–10.5)
Chloride: 105 mEq/L (ref 96–112)
Creatinine, Ser: 0.65 mg/dL (ref 0.50–1.10)
Sodium: 141 mEq/L (ref 135–145)
Total Bilirubin: 0.2 mg/dL — ABNORMAL LOW (ref 0.3–1.2)

## 2012-09-29 LAB — URINALYSIS, DIPSTICK ONLY
Glucose, UA: NEGATIVE mg/dL
Ketones, ur: NEGATIVE mg/dL
Nitrite: NEGATIVE
Protein, ur: NEGATIVE mg/dL
Specific Gravity, Urine: 1.037 — ABNORMAL HIGH (ref 1.005–1.030)
Urobilinogen, UA: 1 mg/dL (ref 0.0–1.0)

## 2012-09-29 LAB — CBC
MCV: 81.1 fL (ref 78.0–100.0)
Platelets: 284 10*3/uL (ref 150–400)
RDW: 12 % (ref 11.5–15.5)
WBC: 7.3 10*3/uL (ref 4.0–10.5)

## 2012-09-29 LAB — POCT PREGNANCY, URINE: Preg Test, Ur: NEGATIVE

## 2012-09-29 MED ORDER — HYDROMORPHONE HCL PF 1 MG/ML IJ SOLN
1.0000 mg | Freq: Once | INTRAMUSCULAR | Status: AC
  Administered 2012-09-29: 1 mg via INTRAMUSCULAR

## 2012-09-29 MED ORDER — HYDROMORPHONE HCL PF 1 MG/ML IJ SOLN
1.0000 mg | Freq: Once | INTRAMUSCULAR | Status: AC
  Administered 2012-09-29: 1 mg via INTRAMUSCULAR
  Filled 2012-09-29: qty 1

## 2012-09-29 MED ORDER — HYDROMORPHONE HCL PF 1 MG/ML IJ SOLN
1.0000 mg | Freq: Once | INTRAMUSCULAR | Status: DC
  Filled 2012-09-29: qty 1

## 2012-09-29 NOTE — ED Provider Notes (Signed)
History     CSN: [id number]  Arrival date & time 09/28/12  2159   First MD Initiated Contact with Patient 09/29/12 0003      Chief Complaint  Patient presents with  . Abdominal Pain    (Consider location/radiation/quality/duration/timing/severity/associated sxs/prior treatment) Patient is a 32 y.o. female presenting with abdominal pain. The history is provided by the patient.  Abdominal Pain Pain location:  RUQ Pain quality: aching   Pain radiates to:  Does not radiate Pain severity:  Moderate Onset quality:  Gradual Duration:  1 day Timing:  Constant   Past Medical History  Diagnosis Date  . Pancreatitis   . S/P laparoscopic cholecystectomy   . Fibroids   . Ovarian cyst   . Chronic abdominal pain   . Anemia     Past Surgical History  Procedure Laterality Date  . Cholecystectomy    . Abdominal hysterectomy    . Dilation and curettage of uterus    . Ercp w/ metal stent placement      Family History  Problem Relation Age of Onset  . Diabetes Mother   . Hypertension Mother   . Hypertension Father   . Diabetes Father   . Asthma Daughter     History  Substance Use Topics  . Smoking status: Current Every Day Smoker -- 0.50 packs/day for 10 years    Types: Cigarettes  . Smokeless tobacco: Never Used  . Alcohol Use: No    OB History   Grav Para Term Preterm Abortions TAB SAB Ect Mult Living   4 2 1 1 2  2          Review of Systems  Gastrointestinal: Positive for abdominal pain.  All other systems reviewed and are negative.    Allergies  Celecoxib; Fentanyl; Ibuprofen; Ketorolac tromethamine; Meperidine hcl; Morphine; Ondansetron; Shellfish allergy; Tramadol; Coconut flavor; Propoxyphene-acetaminophen; and Tylenol  Home Medications  No current outpatient prescriptions on file.  BP 147/97  Pulse 79  Temp(Src) 98.2 F (36.8 C) (Oral)  Resp 16  SpO2 100%  LMP 08/19/2012  Physical Exam  Nursing note and vitals reviewed. Constitutional: She  is oriented to person, place, and time. She appears well-developed and well-nourished.  HENT:  Head: Normocephalic and atraumatic.  Eyes: Conjunctivae and EOM are normal. Pupils are equal, round, and reactive to light.  Neck: Normal range of motion.  Cardiovascular: Normal rate, regular rhythm and normal heart sounds.   Pulmonary/Chest: Effort normal and breath sounds normal.  Abdominal: Soft. Bowel sounds are normal. There is tenderness.    Musculoskeletal: Normal range of motion.  Neurological: She is alert and oriented to person, place, and time.  Skin: Skin is warm and dry.  Psychiatric: She has a normal mood and affect. Her behavior is normal.    ED Course  Procedures (including critical care time)  Labs Reviewed  CBC  COMPREHENSIVE METABOLIC PANEL  LIPASE, BLOOD  URINALYSIS, DIPSTICK ONLY   No results found.   No diagnosis found.    MDM  + abd pain .  Minimal tenderness on palpation. Not in rlq.  Will labs, ua, analgesia,  reassess        Rosanne Ashing, MD 09/29/12 [phone]

## 2012-09-29 NOTE — ED Notes (Signed)
Notified EDP that pt continues to have pain

## 2012-09-29 NOTE — ED Notes (Signed)
Lab at bedside

## 2012-09-30 DIAGNOSIS — Z8742 Personal history of other diseases of the female genital tract: Secondary | ICD-10-CM | POA: Insufficient documentation

## 2012-09-30 DIAGNOSIS — Z8719 Personal history of other diseases of the digestive system: Secondary | ICD-10-CM | POA: Insufficient documentation

## 2012-09-30 DIAGNOSIS — Z862 Personal history of diseases of the blood and blood-forming organs and certain disorders involving the immune mechanism: Secondary | ICD-10-CM | POA: Insufficient documentation

## 2012-09-30 DIAGNOSIS — R109 Unspecified abdominal pain: Secondary | ICD-10-CM | POA: Insufficient documentation

## 2012-09-30 LAB — COMPREHENSIVE METABOLIC PANEL
AST: 30 U/L (ref 0–37)
Albumin: 3.6 g/dL (ref 3.5–5.2)
Alkaline Phosphatase: 103 U/L (ref 39–117)
BUN: 16 mg/dL (ref 6–23)
Chloride: 109 mEq/L (ref 96–112)
Potassium: 4.2 mEq/L (ref 3.5–5.1)
Sodium: 143 mEq/L (ref 135–145)
Total Bilirubin: 0.2 mg/dL — ABNORMAL LOW (ref 0.3–1.2)
Total Protein: 7.5 g/dL (ref 6.0–8.3)

## 2012-09-30 LAB — CBC WITH DIFFERENTIAL/PLATELET
Basophils Absolute: 0.1 10*3/uL (ref 0.0–0.1)
Basophils Relative: 1 % (ref 0–1)
Eosinophils Absolute: 0.5 10*3/uL (ref 0.0–0.7)
Hemoglobin: 12.6 g/dL (ref 12.0–15.0)
MCH: 27.7 pg (ref 26.0–34.0)
MCHC: 34.6 g/dL (ref 30.0–36.0)
Neutro Abs: 2.8 10*3/uL (ref 1.7–7.7)
Neutrophils Relative %: 38 % — ABNORMAL LOW (ref 43–77)
Platelets: 303 10*3/uL (ref 150–400)
RDW: 11.9 % (ref 11.5–15.5)

## 2012-09-30 LAB — LIPASE, BLOOD: Lipase: 48 U/L (ref 11–59)

## 2012-09-30 NOTE — ED Notes (Addendum)
Bad abd. Pains. Here other day. Enzymes elevated. 09/20/12 - [facility] took a stent out of pancreas. Took nothing for pain.

## 2012-10-01 ENCOUNTER — Emergency Department (HOSPITAL_COMMUNITY)
Admission: EM | Admit: 2012-10-01 | Discharge: 2012-10-01 | Disposition: A | Discharge status: Home / Self Care | Payer: Medicaid Other | Attending: Emergency Medicine | Admitting: Emergency Medicine

## 2012-10-01 DIAGNOSIS — R109 Unspecified abdominal pain: Secondary | ICD-10-CM

## 2012-10-01 MED ORDER — OXYCODONE HCL 5 MG PO TABS
10.0000 mg | ORAL_TABLET | Freq: Once | ORAL | Status: AC
  Administered 2012-10-01: 10 mg via ORAL
  Filled 2012-10-01 (×2): qty 1

## 2012-10-01 NOTE — ED Provider Notes (Signed)
History     CSN: [id number]  Arrival date & time 09/30/12  2118   First MD Initiated Contact with Patient 10/01/12 0153      Chief Complaint  Patient presents with  . Abdominal Pain    (Consider location/radiation/quality/duration/timing/severity/associated sxs/prior treatment) HPI 32 yo female presents to the ER with complaint of abdominal pain.  Pt with h/o chronic abd pain, seen frequently in the ED for same.  Pt was being seen at [facility], had stent placed during ERCP, but removed when her enzymes increased.  Pt had been advised multiple times by EDP to seek pain management.  Pt c/o right sided abd pain, epigastric pain, nausea.  No vomiting, no fever.  Past Medical History  Diagnosis Date  . Pancreatitis   . S/P laparoscopic cholecystectomy   . Fibroids   . Ovarian cyst   . Chronic abdominal pain   . Anemia     Past Surgical History  Procedure Laterality Date  . Cholecystectomy    . Abdominal hysterectomy    . Dilation and curettage of uterus    . Ercp w/ metal stent placement      Family History  Problem Relation Age of Onset  . Diabetes Mother   . Hypertension Mother   . Hypertension Father   . Diabetes Father   . Asthma Daughter     History  Substance Use Topics  . Smoking status: Current Every Day Smoker -- 0.50 packs/day for 10 years    Types: Cigarettes  . Smokeless tobacco: Never Used  . Alcohol Use: No    OB History   Grav Para Term Preterm Abortions TAB SAB Ect Mult Living   4 2 1 1 2  2          Review of Systems  All other systems reviewed and are negative.    Allergies  Celecoxib; Fentanyl; Ibuprofen; Ketorolac tromethamine; Meperidine hcl; Morphine; Ondansetron; Shellfish allergy; Tramadol; Coconut flavor; Propoxyphene-acetaminophen; and Tylenol  Home Medications  No current outpatient prescriptions on file.  BP 143/84  Pulse 64  Temp(Src) 98.2 F (36.8 C) (Oral)  Resp 18  SpO2 99%  LMP 08/19/2012  Physical Exam  Nursing  note and vitals reviewed. Constitutional: She appears well-developed and well-nourished. No distress.  HENT:  Head: Normocephalic and atraumatic.  Nose: Nose normal.  Mouth/Throat: Oropharynx is clear and moist.  Eyes: Conjunctivae and EOM are normal. Pupils are equal, round, and reactive to light.  Neck: Normal range of motion. Neck supple. No JVD present. No tracheal deviation present. No thyromegaly present.  Cardiovascular: Normal rate, regular rhythm, normal heart sounds and intact distal pulses.  Exam reveals no gallop and no friction rub.   No murmur heard. Pulmonary/Chest: Effort normal and breath sounds normal. No stridor. No respiratory distress. She has no wheezes. She has no rales. She exhibits no tenderness.  Abdominal: Soft. Bowel sounds are normal. She exhibits no distension and no mass. There is tenderness (mild difffuse pain). There is no rebound and no guarding.  Musculoskeletal: Normal range of motion. She exhibits no edema and no tenderness.  Lymphadenopathy:    She has no cervical adenopathy.  Neurological: She is alert. She exhibits normal muscle tone. Coordination normal.  Skin: Skin is warm and dry. No rash noted. No erythema. No pallor.  Psychiatric: She has a normal mood and affect. Her behavior is normal. Judgment and thought content normal.    ED Course  Procedures (including critical care time)  Labs Reviewed  CBC WITH DIFFERENTIAL -  Abnormal; Notable for the following:    Neutrophils Relative 38 (*)    Lymphocytes Relative 48 (*)    Eosinophils Relative 6 (*)    All other components within normal limits  COMPREHENSIVE METABOLIC PANEL - Abnormal; Notable for the following:    ALT 75 (*)    Total Bilirubin 0.2 (*)    All other components within normal limits  LIPASE, BLOOD   No results found.   1. Abdominal pain       MDM  32 yo female with abd pain, h/o same.  Labs unremarkable.  Pt advised to f/u with pcm.        Olivia Mackie,  MD 10/04/12 1249

## 2012-10-05 ENCOUNTER — Encounter (HOSPITAL_COMMUNITY): Payer: Self-pay

## 2012-10-05 ENCOUNTER — Emergency Department (HOSPITAL_COMMUNITY)
Admission: EM | Admit: 2012-10-05 | Discharge: 2012-10-05 | Disposition: A | Discharge status: Home / Self Care | Payer: Medicaid Other | Attending: Emergency Medicine | Admitting: Emergency Medicine

## 2012-10-05 DIAGNOSIS — G8929 Other chronic pain: Secondary | ICD-10-CM | POA: Insufficient documentation

## 2012-10-05 DIAGNOSIS — R1084 Generalized abdominal pain: Secondary | ICD-10-CM | POA: Insufficient documentation

## 2012-10-05 DIAGNOSIS — Z8719 Personal history of other diseases of the digestive system: Secondary | ICD-10-CM | POA: Insufficient documentation

## 2012-10-05 DIAGNOSIS — F17200 Nicotine dependence, unspecified, uncomplicated: Secondary | ICD-10-CM | POA: Insufficient documentation

## 2012-10-05 DIAGNOSIS — Z9089 Acquired absence of other organs: Secondary | ICD-10-CM | POA: Insufficient documentation

## 2012-10-05 DIAGNOSIS — Z862 Personal history of diseases of the blood and blood-forming organs and certain disorders involving the immune mechanism: Secondary | ICD-10-CM | POA: Insufficient documentation

## 2012-10-05 DIAGNOSIS — R109 Unspecified abdominal pain: Secondary | ICD-10-CM

## 2012-10-05 DIAGNOSIS — Z90710 Acquired absence of both cervix and uterus: Secondary | ICD-10-CM | POA: Insufficient documentation

## 2012-10-05 DIAGNOSIS — Z8742 Personal history of other diseases of the female genital tract: Secondary | ICD-10-CM | POA: Insufficient documentation

## 2012-10-05 MED ORDER — ACETAMINOPHEN 325 MG PO TABS
650.0000 mg | ORAL_TABLET | Freq: Once | ORAL | Status: AC
  Administered 2012-10-05: 650 mg via ORAL
  Filled 2012-10-05: qty 2
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
## 2012-10-05 NOTE — ED Notes (Signed)
Pt had a stent removed for her pancreas on March 19th, she now complains of generalized abd pain

## 2012-10-14 ENCOUNTER — Encounter (HOSPITAL_COMMUNITY): Payer: Self-pay | Admitting: Physical Medicine and Rehabilitation

## 2012-10-14 ENCOUNTER — Emergency Department (HOSPITAL_COMMUNITY)
Admission: EM | Admit: 2012-10-14 | Discharge: 2012-10-14 | Disposition: A | Discharge status: Home / Self Care | Payer: Medicaid Other | Attending: Emergency Medicine | Admitting: Emergency Medicine

## 2012-10-14 DIAGNOSIS — Z8719 Personal history of other diseases of the digestive system: Secondary | ICD-10-CM | POA: Insufficient documentation

## 2012-10-14 DIAGNOSIS — R109 Unspecified abdominal pain: Secondary | ICD-10-CM | POA: Insufficient documentation

## 2012-10-14 DIAGNOSIS — R197 Diarrhea, unspecified: Secondary | ICD-10-CM | POA: Insufficient documentation

## 2012-10-14 DIAGNOSIS — R112 Nausea with vomiting, unspecified: Secondary | ICD-10-CM | POA: Insufficient documentation

## 2012-10-14 DIAGNOSIS — Z8742 Personal history of other diseases of the female genital tract: Secondary | ICD-10-CM | POA: Insufficient documentation

## 2012-10-14 DIAGNOSIS — Z3202 Encounter for pregnancy test, result negative: Secondary | ICD-10-CM | POA: Insufficient documentation

## 2012-10-14 DIAGNOSIS — F17200 Nicotine dependence, unspecified, uncomplicated: Secondary | ICD-10-CM | POA: Insufficient documentation

## 2012-10-14 DIAGNOSIS — Z862 Personal history of diseases of the blood and blood-forming organs and certain disorders involving the immune mechanism: Secondary | ICD-10-CM | POA: Insufficient documentation

## 2012-10-14 DIAGNOSIS — G8929 Other chronic pain: Secondary | ICD-10-CM

## 2012-10-14 LAB — CBC WITH DIFFERENTIAL/PLATELET
Basophils Absolute: 0 10*3/uL (ref 0.0–0.1)
Basophils Relative: 0 % (ref 0–1)
Eosinophils Absolute: 0.5 10*3/uL (ref 0.0–0.7)
Eosinophils Relative: 7 % — ABNORMAL HIGH (ref 0–5)
HCT: 37.3 % (ref 36.0–46.0)
Hemoglobin: 13.1 g/dL (ref 12.0–15.0)
Lymphocytes Relative: 45 % (ref 12–46)
Lymphs Abs: 3.3 10*3/uL (ref 0.7–4.0)
MCH: 27.8 pg (ref 26.0–34.0)
MCHC: 35.1 g/dL (ref 30.0–36.0)
MCV: 79.2 fL (ref 78.0–100.0)
Monocytes Absolute: 0.5 10*3/uL (ref 0.1–1.0)
Monocytes Relative: 7 % (ref 3–12)
Neutro Abs: 3 10*3/uL (ref 1.7–7.7)
Neutrophils Relative %: 41 % — ABNORMAL LOW (ref 43–77)
Platelets: 286 10*3/uL (ref 150–400)
RBC: 4.71 MIL/uL (ref 3.87–5.11)
RDW: 11.9 % (ref 11.5–15.5)
WBC: 7.3 10*3/uL (ref 4.0–10.5)

## 2012-10-14 LAB — BASIC METABOLIC PANEL WITH GFR
BUN: 13 mg/dL (ref 6–23)
CO2: 25 meq/L (ref 19–32)
Calcium: 9.4 mg/dL (ref 8.4–10.5)
Chloride: 104 meq/L (ref 96–112)
Creatinine, Ser: 0.69 mg/dL (ref 0.50–1.10)
GFR calc Af Amer: >90 mL/min
GFR calc non Af Amer: >90 mL/min
Glucose, Bld: 99 mg/dL (ref 70–99)
Potassium: 4.7 meq/L (ref 3.5–5.1)
Sodium: 138 meq/L (ref 135–145)

## 2012-10-14 LAB — HEPATIC FUNCTION PANEL
ALT: 58 U/L — ABNORMAL HIGH (ref 0–35)
Albumin: 3.9 g/dL (ref 3.5–5.2)
Alkaline Phosphatase: 97 U/L (ref 39–117)
Total Protein: 7.7 g/dL (ref 6.0–8.3)

## 2012-10-14 LAB — URINALYSIS, ROUTINE W REFLEX MICROSCOPIC
Bilirubin Urine: NEGATIVE
Hgb urine dipstick: NEGATIVE
Specific Gravity, Urine: 1.026 (ref 1.005–1.030)
pH: 6 (ref 5.0–8.0)

## 2012-10-14 MED ORDER — DIPHENHYDRAMINE HCL 25 MG PO CAPS
25.0000 mg | ORAL_CAPSULE | Freq: Once | ORAL | Status: DC
  Filled 2012-10-14: qty 1

## 2012-10-14 MED ORDER — PROMETHAZINE HCL 25 MG RE SUPP: 25.0000 mg | Freq: Four times a day (QID) | RECTAL | Status: DC | PRN

## 2012-10-14 MED ORDER — ONDANSETRON HCL 4 MG/2ML IJ SOLN
4.0000 mg | Freq: Once | INTRAMUSCULAR | Status: AC
  Administered 2012-10-14: 4 mg via INTRAVENOUS
  Filled 2012-10-14: qty 2

## 2012-10-14 MED ORDER — SODIUM CHLORIDE 0.9 % IV BOLUS (SEPSIS)
1000.0000 mL | Freq: Once | INTRAVENOUS | Status: AC
  Administered 2012-10-14: 1000 mL via INTRAVENOUS

## 2012-10-14 MED ORDER — HYDROMORPHONE HCL PF 1 MG/ML IJ SOLN
1.0000 mg | Freq: Once | INTRAMUSCULAR | Status: AC
  Administered 2012-10-14: 1 mg via INTRAVENOUS
  Filled 2012-10-14: qty 1

## 2012-10-14 NOTE — ED Provider Notes (Signed)
History     CSN: [id number]  Arrival date & time 10/14/12  1836   First MD Initiated Contact with Patient 10/14/12 1848      Chief Complaint  Patient presents with  . Abdominal Pain  . Emesis  . Diarrhea    (Consider location/radiation/quality/duration/timing/severity/associated sxs/prior treatment) HPI Comments: Pt comes in with cc of abd pain. Pt has hx of chronic pancreatitis, has had several visits to the ED over the past 2 months. Pt states that last week she had a pancreatic duct stent placed at [facility]. She states that her current pain is same as her previous pain, it is constant, epigastric, right sided, and has no specific aggravating or relieving factors. She has nausea, but no emesis. She has no diarrhea. She takes 10 mg oxy at home with minimal relief.  Patient is a 32 y.o. female presenting with abdominal pain, vomiting, and diarrhea. The history is provided by the patient.  Abdominal Pain Associated symptoms: diarrhea, nausea and vomiting   Associated symptoms: no chest pain, no dysuria and no shortness of breath   Emesis Associated symptoms: abdominal pain and diarrhea   Associated symptoms: no headaches   Diarrhea Associated symptoms: abdominal pain and vomiting   Associated symptoms: no headaches     Past Medical History  Diagnosis Date  . Pancreatitis   . S/P laparoscopic cholecystectomy   . Fibroids   . Ovarian cyst   . Chronic abdominal pain   . Anemia     Past Surgical History  Procedure Laterality Date  . Cholecystectomy    . Abdominal hysterectomy    . Dilation and curettage of uterus    . Ercp w/ metal stent placement      Family History  Problem Relation Age of Onset  . Diabetes Mother   . Hypertension Mother   . Hypertension Father   . Diabetes Father   . Asthma Daughter     History  Substance Use Topics  . Smoking status: Current Every Day Smoker -- 0.50 packs/day for 10 years    Types: Cigarettes  . Smokeless tobacco: Never  Used  . Alcohol Use: No    OB History   Grav Para Term Preterm Abortions TAB SAB Ect Mult Living   4 2 1 1 2  2          Review of Systems  Constitutional: Negative for activity change.  HENT: Negative.  Negative for neck pain.   Respiratory: Negative for shortness of breath.   Cardiovascular: Negative for chest pain.  Gastrointestinal: Positive for nausea, vomiting, abdominal pain and diarrhea.  Genitourinary: Negative for dysuria.  Neurological: Negative for headaches.    Allergies  Celecoxib; Fentanyl; Ibuprofen; Ketorolac tromethamine; Meperidine hcl; Morphine; Ondansetron; Shellfish allergy; Tramadol; Coconut flavor; Propoxyphene-acetaminophen; and Tylenol  Home Medications   Current Outpatient Rx  Name  Route  Sig  Dispense  Refill  . diphenhydrAMINE (BENADRYL) 25 MG tablet   Oral   Take 50 mg by mouth every 6 (six) hours as needed for itching.         . metoCLOPramide (REGLAN) 5 MG tablet   Oral   Take 5 mg by mouth as needed (nausea).         Marland Kitchen oxyCODONE (OXYCONTIN) 10 MG 12 hr tablet   Oral   Take 10 mg by mouth 4 (four) times daily.           BP 124/79  Pulse 95  Temp(Src) 98.2 F (36.8 C) (Oral)  SpO2  99%  LMP 08/19/2012  Physical Exam  Nursing note and vitals reviewed. Constitutional: She is oriented to person, place, and time. She appears well-developed and well-nourished.  HENT:  Head: Normocephalic and atraumatic.  Eyes: EOM are normal. Pupils are equal, round, and reactive to light.  Neck: Neck supple.  Cardiovascular: Normal rate, regular rhythm and normal heart sounds.   No murmur heard. Pulmonary/Chest: Effort normal. No respiratory distress.  Abdominal: Soft. She exhibits no distension. There is tenderness. There is no rebound and no guarding.  Epigastric tenderness, ruq tenderness  Neurological: She is alert and oriented to person, place, and time.  Skin: Skin is warm and dry.    ED Course  Procedures (including critical care  time)  Labs Reviewed  URINALYSIS, ROUTINE W REFLEX MICROSCOPIC - Abnormal; Notable for the following:    APPearance HAZY (*)    All other components within normal limits  PREGNANCY, URINE  CBC WITH DIFFERENTIAL  BASIC METABOLIC PANEL  HEPATIC FUNCTION PANEL  LIPASE, BLOOD   No results found.   No diagnosis found.    MDM  DDx includes: Pancreatitis Hepatobiliary pathology including cholecystitis Gastritis/PUD SBO ACS syndrome Aortic Dissection   Pt comes in with cc of abd pain. Has chronic abd pain and pancreatitis. Pt had a recent duct placement. We will get basic labs here. 1 round of iv pain meds. Will start fluuid challenge. Will advocate pcp and pain follow up.   CT done 2-3 weeks ago: IMPRESSION:  1. No acute abnormality seen within the abdomen or pelvis.  2. Interval placement of pancreatic duct stent; the pancreatic  duct is normal in caliber, and the stent is noted in expected  position.  3. Mild prominence of the intrahepatic biliary ducts remains  within normal limits status post cholecystectomy.  4. Small right renal cyst again seen.     9:26 PM Pt has received 1 round of iv meds. Requesting iv benadryl now, All labs are baseline normal. I discussed with thwe patient that we will be switching to po meds, and she got agitated. She wants iv pain meds. I explained to her that we will give her same meds by mouth - she is refusing. Will d.c    Derwood Kaplan, MD 10/14/12 2128

## 2012-10-14 NOTE — ED Notes (Signed)
Pt upset after MD set orders for discharge, cursing in room, removed own IV, refused discharge paperwork instructions and prescriptions.  Ambulatory to discharge desk with family.

## 2012-10-14 NOTE — ED Notes (Signed)
Pt presents to department for evaluation of diffuse abdominal pain and N/V/D. Ongoing for several days. 10/10 pain at the time. Pt states "I think this is a pancreatitis flare up.' pt is alert and oriented x4.

## 2012-10-23 ENCOUNTER — Encounter (HOSPITAL_COMMUNITY): Payer: Self-pay | Admitting: *Deleted

## 2012-10-23 ENCOUNTER — Encounter: Payer: Self-pay | Admitting: Emergency Medicine

## 2012-10-23 ENCOUNTER — Emergency Department (HOSPITAL_COMMUNITY)
Admission: EM | Admit: 2012-10-23 | Discharge: 2012-10-23 | Disposition: A | Discharge status: Home / Self Care | Payer: Medicaid Other | Attending: Emergency Medicine | Admitting: Emergency Medicine

## 2012-10-23 DIAGNOSIS — R197 Diarrhea, unspecified: Secondary | ICD-10-CM | POA: Insufficient documentation

## 2012-10-23 DIAGNOSIS — D649 Anemia, unspecified: Secondary | ICD-10-CM | POA: Insufficient documentation

## 2012-10-23 DIAGNOSIS — R112 Nausea with vomiting, unspecified: Secondary | ICD-10-CM | POA: Insufficient documentation

## 2012-10-23 DIAGNOSIS — G8929 Other chronic pain: Secondary | ICD-10-CM | POA: Insufficient documentation

## 2012-10-23 DIAGNOSIS — Z9889 Other specified postprocedural states: Secondary | ICD-10-CM | POA: Insufficient documentation

## 2012-10-23 DIAGNOSIS — F17200 Nicotine dependence, unspecified, uncomplicated: Secondary | ICD-10-CM | POA: Insufficient documentation

## 2012-10-23 DIAGNOSIS — Z8742 Personal history of other diseases of the female genital tract: Secondary | ICD-10-CM | POA: Insufficient documentation

## 2012-10-23 DIAGNOSIS — R109 Unspecified abdominal pain: Secondary | ICD-10-CM

## 2012-10-23 DIAGNOSIS — K861 Other chronic pancreatitis: Secondary | ICD-10-CM | POA: Insufficient documentation

## 2012-10-23 LAB — CBC WITH DIFFERENTIAL/PLATELET
Eosinophils Relative: 8 % — ABNORMAL HIGH (ref 0–5)
HCT: 37.8 % (ref 36.0–46.0)
Lymphocytes Relative: 36 % (ref 12–46)
Lymphs Abs: 2.3 10*3/uL (ref 0.7–4.0)
MCV: 78.6 fL (ref 78.0–100.0)
Monocytes Absolute: 0.6 10*3/uL (ref 0.1–1.0)
Platelets: 230 10*3/uL (ref 150–400)
RBC: 4.81 MIL/uL (ref 3.87–5.11)
WBC: 6.3 10*3/uL (ref 4.0–10.5)

## 2012-10-23 LAB — COMPREHENSIVE METABOLIC PANEL
ALT: 47 U/L — ABNORMAL HIGH (ref 0–35)
CO2: 27 mEq/L (ref 19–32)
Calcium: 9.4 mg/dL (ref 8.4–10.5)
Chloride: 107 mEq/L (ref 96–112)
GFR calc Af Amer: >90 mL/min (ref >90)
GFR calc non Af Amer: >90 mL/min (ref >90)
Glucose, Bld: 95 mg/dL (ref 70–99)
Sodium: 143 mEq/L (ref 135–145)
Total Bilirubin: 0.3 mg/dL (ref 0.3–1.2)

## 2012-10-23 MED ORDER — PROMETHAZINE HCL 25 MG PO TABS
25.0000 mg | ORAL_TABLET | Freq: Once | ORAL | Status: DC
  Filled 2012-10-23: qty 1

## 2012-10-23 MED ORDER — HYDROMORPHONE HCL PF 1 MG/ML IJ SOLN: 1.0000 mg | Freq: Once | INTRAMUSCULAR | Status: DC

## 2012-10-23 MED ORDER — ONDANSETRON HCL 4 MG/2ML IJ SOLN: 4.0000 mg | Freq: Once | INTRAMUSCULAR | Status: DC

## 2012-10-23 NOTE — ED Notes (Signed)
Pt with hx of chronic pancreatitis to ED c/o nvd x 2 weeks.

## 2012-10-23 NOTE — ED Provider Notes (Signed)
History     CSN: [id number]  Arrival date & time 10/23/12  1117   First MD Initiated Contact with Patient 10/23/12 1130      Chief Complaint  Patient presents with  . Emesis  . Abdominal Pain  . Diarrhea    (Consider location/radiation/quality/duration/timing/severity/associated sxs/prior treatment) Patient is a 32 y.o. female presenting with vomiting, abdominal pain, and diarrhea. The history is provided by the patient.  Emesis Severity:  Severe Associated symptoms: abdominal pain and diarrhea   Associated symptoms: no headaches   Abdominal Pain Associated symptoms: diarrhea, nausea and vomiting   Associated symptoms: no chest pain, no dysuria, no fever and no shortness of breath   Diarrhea Associated symptoms: abdominal pain and vomiting   Associated symptoms: no fever and no headaches    presents with a two-week history of right-sided abdominal pain and epigastric abdominal pain. Associated with nausea vomiting and diarrhea. In the month of March patient was seen 8 times for same complaint. This is her third visit this month for the same complaint. Patient has a history of chronic pancreatitis or acute pancreatitis. Did have a stent placed and removed in March. Patient still with persistent pain. Patient appears in no acute distress here in the emergency department. States the pain is 10 out of 10 nonradiating described as sharp. Not made worse or better by anything. Patient claims that she was better since the last visit which was April 12. If she's had pain for 2 weeks it doesn't really add up very well.  Past Medical History  Diagnosis Date  . Pancreatitis   . S/P laparoscopic cholecystectomy   . Fibroids   . Ovarian cyst   . Chronic abdominal pain   . Anemia     Past Surgical History  Procedure Laterality Date  . Cholecystectomy    . Dilation and curettage of uterus    . Ercp w/ metal stent placement      Family History  Problem Relation Age of Onset  .  Diabetes Mother   . Hypertension Mother   . Hypertension Father   . Diabetes Father   . Asthma Daughter     History  Substance Use Topics  . Smoking status: Current Every Day Smoker -- 0.50 packs/day for 10 years    Types: Cigarettes  . Smokeless tobacco: Never Used  . Alcohol Use: No    OB History   Grav Para Term Preterm Abortions TAB SAB Ect Mult Living   4 2 1 1 2  2          Review of Systems  Constitutional: Negative for fever.  HENT: Negative for congestion.   Eyes: Negative for redness.  Respiratory: Negative for shortness of breath.   Cardiovascular: Negative for chest pain.  Gastrointestinal: Positive for nausea, vomiting, abdominal pain and diarrhea.  Genitourinary: Negative for dysuria.  Musculoskeletal: Negative for back pain.  Skin: Negative for rash.  Neurological: Negative for headaches.  Hematological: Does not bruise/bleed easily.  Psychiatric/Behavioral: Negative for confusion.    Allergies  Celecoxib; Fentanyl; Ibuprofen; Ketorolac tromethamine; Meperidine hcl; Morphine; Ondansetron; Shellfish allergy; Tramadol; Coconut flavor; Propoxyphene-acetaminophen; and Tylenol  Home Medications   Current Outpatient Rx  Name  Route  Sig  Dispense  Refill  . diphenhydrAMINE (BENADRYL) 25 MG tablet   Oral   Take 50 mg by mouth every 6 (six) hours as needed for itching.         . metoCLOPramide (REGLAN) 5 MG tablet   Oral   Take  5 mg by mouth as needed (nausea).         . Oxycodone HCl 10 MG TABS   Oral   Take 10 mg by mouth every 6 (six) hours as needed (for pain).         . promethazine (PHENERGAN) 25 MG suppository   Rectal   Place 1 suppository (25 mg total) rectally every 6 (six) hours as needed for nausea.   12 each   0     BP 121/86  Pulse 72  Temp(Src) 97.9 F (36.6 C) (Oral)  Resp 16  SpO2 99%  LMP 08/19/2012  Physical Exam  Constitutional: She is oriented to person, place, and time. She appears well-developed and  well-nourished. No distress.  HENT:  Head: Normocephalic and atraumatic.  Mouth/Throat: Oropharynx is clear and moist.  Eyes: Conjunctivae and EOM are normal. Pupils are equal, round, and reactive to light.  Neck: Normal range of motion. Neck supple.  Cardiovascular: Normal rate and normal heart sounds.   Pulmonary/Chest: Effort normal and breath sounds normal.  Abdominal: Soft. Bowel sounds are normal. There is no tenderness.  Musculoskeletal: Normal range of motion. She exhibits no edema.  Neurological: She is alert and oriented to person, place, and time. No cranial nerve deficit. She exhibits normal muscle tone. Coordination normal.  Skin: Skin is warm. No rash noted.    ED Course  Procedures (including critical care time)  Labs Reviewed  CBC WITH DIFFERENTIAL - Abnormal; Notable for the following:    Eosinophils Relative 8 (*)    All other components within normal limits  COMPREHENSIVE METABOLIC PANEL - Abnormal; Notable for the following:    ALT 47 (*)    All other components within normal limits  LIPASE, BLOOD   No results found. Results for orders placed during the hospital encounter of 10/23/12  CBC WITH DIFFERENTIAL      Result Value Range   WBC 6.3  4.0 - 10.5 K/uL   RBC 4.81  3.87 - 5.11 MIL/uL   Hemoglobin 13.3  12.0 - 15.0 g/dL   HCT 96.0  45.4 - 09.8 %   MCV 78.6  78.0 - 100.0 fL   MCH 27.7  26.0 - 34.0 pg   MCHC 35.2  30.0 - 36.0 g/dL   RDW 11.9  14.7 - 82.9 %   Platelets 230  150 - 400 K/uL   Neutrophils Relative 45  43 - 77 %   Neutro Abs 2.9  1.7 - 7.7 K/uL   Lymphocytes Relative 36  12 - 46 %   Lymphs Abs 2.3  0.7 - 4.0 K/uL   Monocytes Relative 10  3 - 12 %   Monocytes Absolute 0.6  0.1 - 1.0 K/uL   Eosinophils Relative 8 (*) 0 - 5 %   Eosinophils Absolute 0.5  0.0 - 0.7 K/uL   Basophils Relative 1  0 - 1 %   Basophils Absolute 0.0  0.0 - 0.1 K/uL  COMPREHENSIVE METABOLIC PANEL      Result Value Range   Sodium 143  135 - 145 mEq/L   Potassium  3.9  3.5 - 5.1 mEq/L   Chloride 107  96 - 112 mEq/L   CO2 27  19 - 32 mEq/L   Glucose, Bld 95  70 - 99 mg/dL   BUN 14  6 - 23 mg/dL   Creatinine, Ser 5.62  0.50 - 1.10 mg/dL   Calcium 9.4  8.4 - 13.0 mg/dL   Total Protein 7.8  6.0 - 8.3 g/dL   Albumin 3.9  3.5 - 5.2 g/dL   AST 26  0 - 37 U/L   ALT 47 (*) 0 - 35 U/L   Alkaline Phosphatase 85  39 - 117 U/L   Total Bilirubin 0.3  0.3 - 1.2 mg/dL   GFR calc non Af Amer >90  >90 mL/min   GFR calc Af Amer >90  >90 mL/min  LIPASE, BLOOD      Result Value Range   Lipase 30  11 - 59 U/L     1. Chronic abdominal pain       MDM    Patient has had difficulty with chronic pancreatitis her pancreatitis in the past. Today's labs without any significant abnormalities. Patient was seen 8 times in the month of March for pain like this. Did recently have a stent placed and then removed in March. Has not made any difference in the pain. Today no evidence of acute pancreatitis. Lipase is not elevated. Patient in the emergency department in no apparent distress. No nausea vomiting or diarrhea in the department. Treated with Phenergan. Recommend followup with her primary care Dr. which she does have for the chronic abdominal pain.       Shelda Jakes, MD 10/23/12 [phone]

## 2012-10-24 ENCOUNTER — Encounter (HOSPITAL_COMMUNITY): Payer: Self-pay | Admitting: Emergency Medicine

## 2012-10-24 ENCOUNTER — Emergency Department (HOSPITAL_COMMUNITY)
Admission: EM | Admit: 2012-10-24 | Discharge: 2012-10-25 | Disposition: A | Discharge status: Home / Self Care | Payer: Medicaid Other | Attending: Emergency Medicine | Admitting: Emergency Medicine

## 2012-10-24 DIAGNOSIS — Z8719 Personal history of other diseases of the digestive system: Secondary | ICD-10-CM | POA: Insufficient documentation

## 2012-10-24 DIAGNOSIS — G8929 Other chronic pain: Secondary | ICD-10-CM | POA: Insufficient documentation

## 2012-10-24 DIAGNOSIS — K0889 Other specified disorders of teeth and supporting structures: Secondary | ICD-10-CM

## 2012-10-24 DIAGNOSIS — F17200 Nicotine dependence, unspecified, uncomplicated: Secondary | ICD-10-CM | POA: Insufficient documentation

## 2012-10-24 DIAGNOSIS — Z8742 Personal history of other diseases of the female genital tract: Secondary | ICD-10-CM | POA: Insufficient documentation

## 2012-10-24 DIAGNOSIS — Z862 Personal history of diseases of the blood and blood-forming organs and certain disorders involving the immune mechanism: Secondary | ICD-10-CM | POA: Insufficient documentation

## 2012-10-24 DIAGNOSIS — Z9889 Other specified postprocedural states: Secondary | ICD-10-CM | POA: Insufficient documentation

## 2012-10-24 DIAGNOSIS — R109 Unspecified abdominal pain: Secondary | ICD-10-CM | POA: Insufficient documentation

## 2012-10-24 DIAGNOSIS — K089 Disorder of teeth and supporting structures, unspecified: Secondary | ICD-10-CM | POA: Insufficient documentation

## 2012-10-24 MED ORDER — HYDROCODONE-ACETAMINOPHEN 5-325 MG PO TABS
2.0000 | ORAL_TABLET | Freq: Once | ORAL | Status: AC
  Administered 2012-10-25: 2 via ORAL
  Filled 2012-10-24: qty 2

## 2012-10-24 NOTE — ED Notes (Signed)
PT. REPORTS SHE BROKE HER RIGHT UPPER INCISOR THIS EVENING .

## 2012-10-25 MED ORDER — PENICILLIN V POTASSIUM 500 MG PO TABS: 500.0000 mg | ORAL_TABLET | Freq: Three times a day (TID) | ORAL | Status: DC

## 2012-10-26 NOTE — ED Provider Notes (Signed)
Medical screening examination/treatment/procedure(s) were performed by non-physician practitioner and as supervising physician I was immediately available for consultation/collaboration.  Maxene Byington, MD 10/26/12 2300 

## 2012-10-26 NOTE — ED Provider Notes (Signed)
History     CSN: [id number]  Arrival date & time 10/24/12  2152   None     Chief Complaint  Patient presents with  . Dental Problem    (Consider location/radiation/quality/duration/timing/severity/associated sxs/prior treatment) HPI History provided by pt.   Pt was eating a piece of fatback yesterday evening and chipped her L front tooth.  Severely painful, non-radiating, no associated sx.  Has taken 3 tylenol w/out any relief.   Past Medical History  Diagnosis Date  . Pancreatitis   . S/P laparoscopic cholecystectomy   . Fibroids   . Ovarian cyst   . Chronic abdominal pain   . Anemia     Past Surgical History  Procedure Laterality Date  . Cholecystectomy    . Dilation and curettage of uterus    . Ercp w/ metal stent placement      Family History  Problem Relation Age of Onset  . Diabetes Mother   . Hypertension Mother   . Hypertension Father   . Diabetes Father   . Asthma Daughter     History  Substance Use Topics  . Smoking status: Current Every Day Smoker -- 0.50 packs/day for 10 years    Types: Cigarettes  . Smokeless tobacco: Never Used  . Alcohol Use: No    OB History   Grav Para Term Preterm Abortions TAB SAB Ect Mult Living   4 2 1 1 2  2          Review of Systems  All other systems reviewed and are negative.    Allergies  Celecoxib; Fentanyl; Ibuprofen; Ketorolac tromethamine; Meperidine hcl; Morphine; Ondansetron; Shellfish allergy; Tramadol; Coconut flavor; Propoxyphene-acetaminophen; and Tylenol  Home Medications   Current Outpatient Rx  Name  Route  Sig  Dispense  Refill  . diphenhydrAMINE (BENADRYL) 25 MG tablet   Oral   Take 50 mg by mouth every 6 (six) hours as needed for itching.         . metoCLOPramide (REGLAN) 5 MG tablet   Oral   Take 5 mg by mouth as needed (nausea).         . penicillin v potassium (VEETID) 500 MG tablet   Oral   Take 1 tablet (500 mg total) by mouth 3 (three) times daily.   30 tablet   0      BP 110/91  Pulse 79  Temp(Src) 97.5 F (36.4 C) (Oral)  Resp 16  SpO2 99%  LMP 08/19/2012  Physical Exam  Nursing note and vitals reviewed. Constitutional: She is oriented to person, place, and time. She appears well-developed and well-nourished. No distress.  HENT:  Head: Normocephalic and atraumatic.  Ellis type 1 fracture of L upper central incisor.  This tooth with a carie as well.  Ttp.  Adjacent gingiva nml.   Eyes:  Normal appearance  Neck: Normal range of motion.  Cardiovascular: Normal rate and regular rhythm.   Pulmonary/Chest: Effort normal and breath sounds normal. No respiratory distress.  Genitourinary:  No CVA tenderness  Musculoskeletal: Normal range of motion.  Neurological: She is alert and oriented to person, place, and time.  Skin: Skin is warm and dry. No rash noted.  Psychiatric: She has a normal mood and affect. Her behavior is normal.    ED Course  Procedures (including critical care time)  Labs Reviewed - No data to display No results found.   1. Toothache       MDM  31yo F presents w/ Rennis Harding type 1 fracture of  L upper central incisor.  Tooth w/ deep carie.  Pt is seen in ED frequently for chronic pain.  Recently prescribed a 30 day course of percocet 10-325 by another physician, which patient denies.   Offered 1 vicodin for pain in ED and d/c'd home w/ penicillin and referral to dentist on call.          Otilio Miu, PA-C 10/26/12 2006

## 2012-10-27 ENCOUNTER — Encounter (HOSPITAL_COMMUNITY): Payer: Self-pay | Admitting: Family Medicine

## 2012-10-27 ENCOUNTER — Emergency Department (HOSPITAL_COMMUNITY)
Admission: EM | Admit: 2012-10-27 | Discharge: 2012-10-27 | Disposition: A | Discharge status: Home / Self Care | Payer: Medicaid Other | Attending: Emergency Medicine | Admitting: Emergency Medicine

## 2012-10-27 DIAGNOSIS — R1013 Epigastric pain: Secondary | ICD-10-CM | POA: Insufficient documentation

## 2012-10-27 DIAGNOSIS — R112 Nausea with vomiting, unspecified: Secondary | ICD-10-CM | POA: Insufficient documentation

## 2012-10-27 DIAGNOSIS — Z8719 Personal history of other diseases of the digestive system: Secondary | ICD-10-CM | POA: Insufficient documentation

## 2012-10-27 DIAGNOSIS — G8929 Other chronic pain: Secondary | ICD-10-CM | POA: Insufficient documentation

## 2012-10-27 DIAGNOSIS — R197 Diarrhea, unspecified: Secondary | ICD-10-CM | POA: Insufficient documentation

## 2012-10-27 DIAGNOSIS — Z862 Personal history of diseases of the blood and blood-forming organs and certain disorders involving the immune mechanism: Secondary | ICD-10-CM | POA: Insufficient documentation

## 2012-10-27 DIAGNOSIS — F17200 Nicotine dependence, unspecified, uncomplicated: Secondary | ICD-10-CM | POA: Insufficient documentation

## 2012-10-27 DIAGNOSIS — Z8742 Personal history of other diseases of the female genital tract: Secondary | ICD-10-CM | POA: Insufficient documentation

## 2012-10-27 DIAGNOSIS — R110 Nausea: Secondary | ICD-10-CM

## 2012-10-27 DIAGNOSIS — Z9089 Acquired absence of other organs: Secondary | ICD-10-CM | POA: Insufficient documentation

## 2012-10-27 LAB — CBC WITH DIFFERENTIAL/PLATELET
Basophils Absolute: 0 10*3/uL (ref 0.0–0.1)
Basophils Relative: 0 % (ref 0–1)
Eosinophils Absolute: 0.3 10*3/uL (ref 0.0–0.7)
Eosinophils Relative: 4 % (ref 0–5)
MCH: 27 pg (ref 26.0–34.0)
MCHC: 34.9 g/dL (ref 30.0–36.0)
Neutrophils Relative %: 56 % (ref 43–77)
Platelets: 264 10*3/uL (ref 150–400)
RBC: 4.81 MIL/uL (ref 3.87–5.11)
RDW: 12.3 % (ref 11.5–15.5)

## 2012-10-27 LAB — URINALYSIS, ROUTINE W REFLEX MICROSCOPIC
Ketones, ur: NEGATIVE mg/dL
Leukocytes, UA: NEGATIVE
Protein, ur: NEGATIVE mg/dL
Urobilinogen, UA: 0.2 mg/dL (ref 0.0–1.0)

## 2012-10-27 LAB — BASIC METABOLIC PANEL
Calcium: 9.8 mg/dL (ref 8.4–10.5)
GFR calc Af Amer: >90 mL/min (ref >90)
GFR calc non Af Amer: >90 mL/min (ref >90)
Potassium: 3.9 mEq/L (ref 3.5–5.1)
Sodium: 138 mEq/L (ref 135–145)

## 2012-10-27 MED ORDER — HYDROMORPHONE HCL PF 1 MG/ML IJ SOLN
1.0000 mg | Freq: Once | INTRAMUSCULAR | Status: AC
  Administered 2012-10-27: 1 mg via INTRAMUSCULAR
  Filled 2012-10-27: qty 1

## 2012-10-27 MED ORDER — PROMETHAZINE HCL 25 MG/ML IJ SOLN
25.0000 mg | Freq: Once | INTRAMUSCULAR | Status: DC
  Filled 2012-10-27 (×2): qty 1

## 2012-10-27 MED ORDER — DIPHENHYDRAMINE HCL 50 MG/ML IJ SOLN
25.0000 mg | Freq: Once | INTRAMUSCULAR | Status: AC
  Administered 2012-10-27: 25 mg via INTRAMUSCULAR
  Filled 2012-10-27: qty 1

## 2012-10-27 MED ORDER — HYDROMORPHONE HCL PF 1 MG/ML IJ SOLN: 1.0000 mg | Freq: Once | INTRAMUSCULAR | Status: DC

## 2012-10-27 NOTE — ED Notes (Signed)
Per pt sts 3 weeks of abdominal pain, swelling, N,V.

## 2012-10-27 NOTE — ED Provider Notes (Signed)
I saw and evaluated the patient, reviewed the resident's note and I agree with the findings and plan.   Gilda Crease, MD 10/27/12 2132

## 2012-10-27 NOTE — ED Provider Notes (Signed)
History     CSN: [id number]  Arrival date & time 10/27/12  1628   First MD Initiated Contact with Patient 10/27/12 1936      Chief Complaint  Patient presents with  . Abdominal Pain    (Consider location/radiation/quality/duration/timing/severity/associated sxs/prior treatment) Patient is a 32 y.o. female presenting with abdominal pain. The history is provided by the patient and medical records.  Abdominal Pain Pain location:  Epigastric Pain quality: aching and bloating   Pain radiates to:  Does not radiate Pain severity:  Mild Onset quality:  Gradual Duration: Years. Timing:  Constant Progression:  Waxing and waning Chronicity:  Chronic Context comment:  Multiple surgeries, including stent placement and removal in April Relieved by:  None tried Worsened by:  Nothing tried Ineffective treatments:  None tried Associated symptoms: diarrhea, nausea and vomiting   Associated symptoms: no chest pain, no chills, no constipation, no cough, no dysuria, no fever, no shortness of breath, no vaginal bleeding and no vaginal discharge     Past Medical History  Diagnosis Date  . Pancreatitis   . S/P laparoscopic cholecystectomy   . Fibroids   . Ovarian cyst   . Chronic abdominal pain   . Anemia     Past Surgical History  Procedure Laterality Date  . Cholecystectomy    . Dilation and curettage of uterus    . Ercp w/ metal stent placement      Family History  Problem Relation Age of Onset  . Diabetes Mother   . Hypertension Mother   . Hypertension Father   . Diabetes Father   . Asthma Daughter     History  Substance Use Topics  . Smoking status: Current Every Day Smoker -- 0.50 packs/day for 10 years    Types: Cigarettes  . Smokeless tobacco: Never Used  . Alcohol Use: No    OB History   Grav Para Term Preterm Abortions TAB SAB Ect Mult Living   4 2 1 1 2  2          Review of Systems  Constitutional: Negative for fever, chills, activity change and appetite  change.  HENT: Negative for neck pain and neck stiffness.   Respiratory: Negative for cough, chest tightness, shortness of breath and wheezing.   Cardiovascular: Negative for chest pain and palpitations.  Gastrointestinal: Positive for nausea, vomiting and diarrhea. Negative for abdominal pain and constipation.  Genitourinary: Negative for dysuria, decreased urine volume, vaginal bleeding, vaginal discharge, difficulty urinating and vaginal pain.  Skin: Negative for rash and wound.  Neurological: Negative for seizures, syncope and light-headedness.  Psychiatric/Behavioral: Negative for confusion and agitation.  All other systems reviewed and are negative.    Allergies  Celecoxib; Fentanyl; Ibuprofen; Ketorolac tromethamine; Meperidine hcl; Morphine; Ondansetron; Shellfish allergy; Tramadol; Coconut flavor; Propoxyphene-acetaminophen; and Tylenol  Home Medications   Current Outpatient Rx  Name  Route  Sig  Dispense  Refill  . penicillin v potassium (VEETID) 500 MG tablet   Oral   Take 500 mg by mouth 3 (three) times daily.           BP 124/79  Pulse 84  Temp(Src) 98.3 F (36.8 C)  Resp 18  SpO2 98%  LMP 08/19/2012  Physical Exam  Nursing note and vitals reviewed. Constitutional: She is oriented to person, place, and time. She appears well-developed and well-nourished.  HENT:  Head: Normocephalic and atraumatic.  Right Ear: External ear normal.  Left Ear: External ear normal.  Nose: Nose normal.  Mouth/Throat: Oropharynx is  clear and moist. No oropharyngeal exudate.  Eyes: Conjunctivae are normal. Pupils are equal, round, and reactive to light.  Neck: Normal range of motion. Neck supple.  Cardiovascular: Normal rate, regular rhythm, normal heart sounds and intact distal pulses.  Exam reveals no gallop and no friction rub.   No murmur heard. Pulmonary/Chest: Effort normal and breath sounds normal. No respiratory distress. She has no wheezes. She has no rales. She  exhibits no tenderness.  Abdominal: Soft. Bowel sounds are normal. She exhibits no distension and no mass. There is tenderness (mild diffusely). There is no rebound and no guarding.  Musculoskeletal: Normal range of motion. She exhibits no edema and no tenderness.  Neurological: She is alert and oriented to person, place, and time.  Skin: Skin is warm and dry.  Psychiatric: She has a normal mood and affect. Her behavior is normal. Judgment and thought content normal.    ED Course  Procedures (including critical care time)  Labs Reviewed  CBC WITH DIFFERENTIAL - Abnormal; Notable for the following:    MCV 77.5 (*)    All other components within normal limits  BASIC METABOLIC PANEL - Abnormal; Notable for the following:    Glucose, Bld 106 (*)    All other components within normal limits  LIPASE, BLOOD  URINALYSIS, ROUTINE W REFLEX MICROSCOPIC   No results found.   1. Chronic epigastric pain   2. Nausea      MDM  32 yo F with history of chronic abdominal pain and multiple surgeries, including stent placement and removal in Aprilpresents for exacerbation of chronic epigastric pain with occasional associated non-bloody, non-bilious emesis. Regular non-blood BM's. Passing flatus. AFVSS. Not clinically dehydrated and has been tolerating PO intake. Abdominal exam benign. Patient with ride home and given one IM dose of Dilaudid with significant improvement of pain. Labs not c/w acute pancreatitis, acute hepatitis, renal failure, or electrolyte abnormalities. Patient given return precautions, including worsening of signs or symptoms. Patient instructed to follow-up with primary care physician.          Clemetine Marker, MD 10/27/12 2050

## 2012-10-31 ENCOUNTER — Emergency Department (HOSPITAL_COMMUNITY)
Admission: EM | Admit: 2012-10-31 | Discharge: 2012-10-31 | Disposition: A | Discharge status: Home / Self Care | Payer: Medicaid Other | Attending: Emergency Medicine | Admitting: Emergency Medicine

## 2012-10-31 ENCOUNTER — Encounter (HOSPITAL_COMMUNITY): Payer: Self-pay | Admitting: Emergency Medicine

## 2012-10-31 ENCOUNTER — Emergency Department (HOSPITAL_COMMUNITY): Payer: Medicaid Other

## 2012-10-31 DIAGNOSIS — R109 Unspecified abdominal pain: Secondary | ICD-10-CM

## 2012-10-31 DIAGNOSIS — R197 Diarrhea, unspecified: Secondary | ICD-10-CM | POA: Insufficient documentation

## 2012-10-31 DIAGNOSIS — R1013 Epigastric pain: Secondary | ICD-10-CM | POA: Insufficient documentation

## 2012-10-31 DIAGNOSIS — E669 Obesity, unspecified: Secondary | ICD-10-CM | POA: Insufficient documentation

## 2012-10-31 DIAGNOSIS — Z8719 Personal history of other diseases of the digestive system: Secondary | ICD-10-CM | POA: Insufficient documentation

## 2012-10-31 DIAGNOSIS — Z9889 Other specified postprocedural states: Secondary | ICD-10-CM | POA: Insufficient documentation

## 2012-10-31 DIAGNOSIS — Z862 Personal history of diseases of the blood and blood-forming organs and certain disorders involving the immune mechanism: Secondary | ICD-10-CM | POA: Insufficient documentation

## 2012-10-31 DIAGNOSIS — R112 Nausea with vomiting, unspecified: Secondary | ICD-10-CM | POA: Insufficient documentation

## 2012-10-31 DIAGNOSIS — G8929 Other chronic pain: Secondary | ICD-10-CM

## 2012-10-31 DIAGNOSIS — Z8742 Personal history of other diseases of the female genital tract: Secondary | ICD-10-CM | POA: Insufficient documentation

## 2012-10-31 DIAGNOSIS — Z87891 Personal history of nicotine dependence: Secondary | ICD-10-CM | POA: Insufficient documentation

## 2012-10-31 DIAGNOSIS — Z79899 Other long term (current) drug therapy: Secondary | ICD-10-CM | POA: Insufficient documentation

## 2012-10-31 DIAGNOSIS — Z9089 Acquired absence of other organs: Secondary | ICD-10-CM | POA: Insufficient documentation

## 2012-10-31 LAB — COMPREHENSIVE METABOLIC PANEL
Alkaline Phosphatase: 82 U/L (ref 39–117)
BUN: 15 mg/dL (ref 6–23)
Chloride: 104 mEq/L (ref 96–112)
GFR calc Af Amer: >90 mL/min (ref >90)
Glucose, Bld: 96 mg/dL (ref 70–99)
Potassium: 3.6 mEq/L (ref 3.5–5.1)
Total Bilirubin: 0.4 mg/dL (ref 0.3–1.2)

## 2012-10-31 LAB — CBC WITH DIFFERENTIAL/PLATELET
Eosinophils Absolute: 0.4 10*3/uL (ref 0.0–0.7)
Lymphs Abs: 2.6 10*3/uL (ref 0.7–4.0)
MCH: 27.9 pg (ref 26.0–34.0)
Monocytes Relative: 9 % (ref 3–12)
Neutrophils Relative %: 50 % (ref 43–77)
WBC: 7.2 10*3/uL (ref 4.0–10.5)

## 2012-10-31 MED ORDER — HYDROMORPHONE HCL PF 1 MG/ML IJ SOLN
1.0000 mg | Freq: Once | INTRAMUSCULAR | Status: AC
  Administered 2012-10-31: 1 mg via INTRAVENOUS
  Filled 2012-10-31: qty 1

## 2012-10-31 MED ORDER — HYDROMORPHONE HCL PF 1 MG/ML IJ SOLN: 1.0000 mg | Freq: Once | INTRAMUSCULAR | Status: DC

## 2012-10-31 MED ORDER — HYDROMORPHONE HCL PF 1 MG/ML IJ SOLN
0.5000 mg | Freq: Once | INTRAMUSCULAR | Status: AC
  Administered 2012-10-31: 0.5 mg via INTRAVENOUS
  Filled 2012-10-31: qty 1

## 2012-10-31 MED ORDER — SODIUM CHLORIDE 0.9 % IV BOLUS (SEPSIS)
1000.0000 mL | Freq: Once | INTRAVENOUS | Status: AC
  Administered 2012-10-31: 1000 mL via INTRAVENOUS

## 2012-10-31 MED ORDER — DIPHENHYDRAMINE HCL 50 MG/ML IJ SOLN
50.0000 mg | Freq: Once | INTRAMUSCULAR | Status: AC
  Administered 2012-10-31: 50 mg via INTRAVENOUS
  Filled 2012-10-31: qty 1

## 2012-10-31 MED ORDER — DIPHENHYDRAMINE HCL 50 MG/ML IJ SOLN
25.0000 mg | Freq: Once | INTRAMUSCULAR | Status: AC
  Administered 2012-10-31: 25 mg via INTRAVENOUS
  Filled 2012-10-31: qty 1

## 2012-10-31 MED ORDER — PROMETHAZINE HCL 25 MG/ML IJ SOLN
12.5000 mg | Freq: Once | INTRAMUSCULAR | Status: AC
  Administered 2012-10-31: 12.5 mg via INTRAVENOUS
  Filled 2012-10-31: qty 1

## 2012-10-31 NOTE — ED Provider Notes (Signed)
History     CSN: [id number]  Arrival date & time 10/31/12  1478   First MD Initiated Contact with Patient 10/31/12 [phone]      No chief complaint on file.   (Consider location/radiation/quality/duration/timing/severity/associated sxs/prior treatment) HPI  complains of abdominal pain diffuse worse at epigastrium weeks ago accompanied by several episodes of vomiting and diarrhea. Patient reports one episode of vomiting this morning and 20 episodes of diarrhea this morning pain feels like pancreatitis she's had in the past. She treated herself with Reglan however vomited Reglan this morning. Pain is constant she's vomited "white stuff" makes with a small fleck of blood no blood per no fever no other complaint patient seen here 10/27/2012 for similar complaint. She's also called her physician at the [facility] for followup [facility] of abdominal pain  ,Past Medical History  Diagnosis Date  . Pancreatitis   . S/P laparoscopic cholecystectomy   . Fibroids   . Ovarian cyst   . Chronic abdominal pain   . Anemia     Past Surgical History  Procedure Laterality Date  . Cholecystectomy    . Dilation and curettage of uterus    . Ercp w/ metal stent placement      Family History  Problem Relation Age of Onset  . Diabetes Mother   . Hypertension Mother   . Hypertension Father   . Diabetes Father   . Asthma Daughter     History  Substance Use Topics  . Smoking status: Former Smoker -- 0.50 packs/day for 10 years    Types: Cigarettes    Quit date: 09/01/2012  . Smokeless tobacco: Never Used  . Alcohol Use: No    OB History   Grav Para Term Preterm Abortions TAB SAB Ect Mult Living   4 2 1 1 2  2          Review of Systems  Gastrointestinal: Positive for nausea, vomiting, abdominal pain and diarrhea.  All other systems reviewed and are negative.    Allergies  Celecoxib; Fentanyl; Ibuprofen; Ketorolac tromethamine; Meperidine hcl; Morphine; Ondansetron; Shellfish  allergy; Tramadol; Coconut flavor; Propoxyphene-acetaminophen; and Tylenol  Home Medications   Current Outpatient Rx  Name  Route  Sig  Dispense  Refill  . estradiol (ESTRACE) 2 MG tablet   Oral   Take 2 mg by mouth daily.         . penicillin v potassium (VEETID) 500 MG tablet   Oral   Take 500 mg by mouth 3 (three) times daily.           BP 102/54  Pulse 70  Temp(Src) 98 F (36.7 C) (Oral)  Resp 10  SpO2 100%  LMP 08/19/2012  Physical Exam  Nursing note and vitals reviewed. Constitutional: She appears well-developed and well-nourished.  HENT:  Head: Normocephalic and atraumatic.  Eyes: Conjunctivae are normal. Pupils are equal, round, and reactive to light.  Neck: Neck supple. No tracheal deviation present. No thyromegaly present.  Cardiovascular: Normal rate and regular rhythm.   No murmur heard. Pulmonary/Chest: Effort normal and breath sounds normal.  Abdominal: Soft. Bowel sounds are normal. She exhibits no distension and no mass. There is tenderness. There is no rebound and no guarding.  Obese, mild diffuse tenderness  Musculoskeletal: Normal range of motion. She exhibits no edema and no tenderness.  Neurological: She is alert. Coordination normal.  Skin: Skin is warm and dry. No rash noted.  Psychiatric: She has a normal mood and affect.    ED Course  Procedures (including critical care time)  Labs Reviewed  CBC WITH DIFFERENTIAL  LIPASE, BLOOD  COMPREHENSIVE METABOLIC PANEL   No results found.  Results for orders placed during the hospital encounter of 10/31/12  CBC WITH DIFFERENTIAL      Result Value Range   WBC 7.2  4.0 - 10.5 K/uL   RBC 4.88  3.87 - 5.11 MIL/uL   Hemoglobin 13.6  12.0 - 15.0 g/dL   HCT 14.7  82.9 - 56.2 %   MCV 78.5  78.0 - 100.0 fL   MCH 27.9  26.0 - 34.0 pg   MCHC 35.5  30.0 - 36.0 g/dL   RDW 13.0  86.5 - 78.4 %   Platelets 274  150 - 400 K/uL   Neutrophils Relative 50  43 - 77 %   Neutro Abs 3.6  1.7 - 7.7 K/uL    Lymphocytes Relative 36  12 - 46 %   Lymphs Abs 2.6  0.7 - 4.0 K/uL   Monocytes Relative 9  3 - 12 %   Monocytes Absolute 0.7  0.1 - 1.0 K/uL   Eosinophils Relative 5  0 - 5 %   Eosinophils Absolute 0.4  0.0 - 0.7 K/uL   Basophils Relative 0  0 - 1 %   Basophils Absolute 0.0  0.0 - 0.1 K/uL  LIPASE, BLOOD      Result Value Range   Lipase 42  11 - 59 U/L  COMPREHENSIVE METABOLIC PANEL      Result Value Range   Sodium 139  135 - 145 mEq/L   Potassium 3.6  3.5 - 5.1 mEq/L   Chloride 104  96 - 112 mEq/L   CO2 26  19 - 32 mEq/L   Glucose, Bld 96  70 - 99 mg/dL   BUN 15  6 - 23 mg/dL   Creatinine, Ser 6.96  0.50 - 1.10 mg/dL   Calcium 9.3  8.4 - 29.5 mg/dL   Total Protein 8.0  6.0 - 8.3 g/dL   Albumin 3.9  3.5 - 5.2 g/dL   AST 19  0 - 37 U/L   ALT 21  0 - 35 U/L   Alkaline Phosphatase 82  39 - 117 U/L   Total Bilirubin 0.4  0.3 - 1.2 mg/dL   GFR calc non Af Amer >90  >90 mL/min   GFR calc Af Amer >90  >90 mL/min   Dg Abd Acute W/chest xrays reviewed by me 10/31/2012  *RADIOLOGY REPORT*  Clinical Data: Left upper quadrant pain.  Nausea, vomiting.  ACUTE ABDOMEN SERIES (ABDOMEN 2 VIEW & CHEST 1 VIEW)  Comparison: 09/07/2012  Findings: The bowel gas pattern is normal.  There is no evidence of free intraperitoneal air.  No suspicious radio-opaque calculi or other significant radiographic abnormality is seen. Heart size and mediastinal contours are within normal limits.  Both lungs are clear.  Prior cholecystectomy.  IMPRESSION: No acute findings.   Original Report Authenticated By: Charlett Nose, M.D.     No diagnosis found.  12:30 PM patient feels improved radial home after treatment with intravenous fluids, opioids and antiemetics. She exhibits a walker without difficulty. MDM  Plan she can take Imodium for diarrhea Call the [facility] today for followup and GI referral Diagnosis #1 chronic abdominal pain #2 nausea vomiting and diarrhea       Doug Sou, MD 10/31/12  1239

## 2012-11-01 ENCOUNTER — Encounter (HOSPITAL_COMMUNITY): Payer: Self-pay | Admitting: *Deleted

## 2012-11-01 DIAGNOSIS — Z8719 Personal history of other diseases of the digestive system: Secondary | ICD-10-CM | POA: Insufficient documentation

## 2012-11-01 DIAGNOSIS — Z8742 Personal history of other diseases of the female genital tract: Secondary | ICD-10-CM | POA: Insufficient documentation

## 2012-11-01 DIAGNOSIS — G8929 Other chronic pain: Secondary | ICD-10-CM | POA: Insufficient documentation

## 2012-11-01 DIAGNOSIS — R1013 Epigastric pain: Secondary | ICD-10-CM | POA: Insufficient documentation

## 2012-11-01 DIAGNOSIS — Z87891 Personal history of nicotine dependence: Secondary | ICD-10-CM | POA: Insufficient documentation

## 2012-11-01 DIAGNOSIS — Z79899 Other long term (current) drug therapy: Secondary | ICD-10-CM | POA: Insufficient documentation

## 2012-11-01 DIAGNOSIS — Z9889 Other specified postprocedural states: Secondary | ICD-10-CM | POA: Insufficient documentation

## 2012-11-01 NOTE — ED Notes (Signed)
Pt c/o abdominal pain x several days.  States all meds by mouth are "not helping", c/o n/v/d.  "blood in my vomit".

## 2012-11-02 ENCOUNTER — Emergency Department (HOSPITAL_COMMUNITY)
Admission: EM | Admit: 2012-11-02 | Discharge: 2012-11-02 | Disposition: A | Discharge status: Home / Self Care | Payer: Medicaid Other | Attending: Emergency Medicine | Admitting: Emergency Medicine

## 2012-11-02 DIAGNOSIS — G8929 Other chronic pain: Secondary | ICD-10-CM

## 2012-11-02 MED ORDER — HYDROMORPHONE HCL PF 2 MG/ML IJ SOLN
2.0000 mg | Freq: Once | INTRAMUSCULAR | Status: AC
  Administered 2012-11-02: 2 mg via INTRAMUSCULAR
  Filled 2012-11-02: qty 1

## 2012-11-02 MED ORDER — PROMETHAZINE HCL 25 MG/ML IJ SOLN
25.0000 mg | Freq: Once | INTRAMUSCULAR | Status: AC
  Administered 2012-11-02: 25 mg via INTRAMUSCULAR
  Filled 2012-11-02: qty 1

## 2012-11-02 MED ORDER — DIPHENHYDRAMINE HCL 50 MG/ML IJ SOLN
25.0000 mg | Freq: Once | INTRAMUSCULAR | Status: AC
  Administered 2012-11-02: 25 mg via INTRAMUSCULAR
  Filled 2012-11-02: qty 1

## 2012-11-02 NOTE — ED Provider Notes (Signed)
History     CSN: [id number]  Arrival date & time 11/01/12  2303   First MD Initiated Contact with Patient 11/02/12 0109      Chief Complaint  Patient presents with  . Abdominal Pain    (Consider location/radiation/quality/duration/timing/severity/associated sxs/prior treatment) HPI Comments: Patient with history of chronic abd pain due to pancreatitis.  She frequents the ED due to this pain, most recently yesterday.  A rather extensive workup was performed showing normal cbc, cmp, and lipase.  She returns today not feeling any better.  Patient is a 32 y.o. female presenting with abdominal pain. The history is provided by the patient.  Abdominal Pain Pain location:  Epigastric Pain quality: cramping   Pain radiates to:  Does not radiate Pain severity:  Severe Onset quality:  Sudden Duration:  2 hours Timing:  Constant Progression:  Worsening Chronicity:  Chronic Relieved by:  Nothing Worsened by:  Nothing tried   Past Medical History  Diagnosis Date  . Pancreatitis   . S/P laparoscopic cholecystectomy   . Fibroids   . Ovarian cyst   . Chronic abdominal pain   . Anemia     Past Surgical History  Procedure Laterality Date  . Cholecystectomy    . Dilation and curettage of uterus    . Ercp w/ metal stent placement      Family History  Problem Relation Age of Onset  . Diabetes Mother   . Hypertension Mother   . Hypertension Father   . Diabetes Father   . Asthma Daughter     History  Substance Use Topics  . Smoking status: Former Smoker -- 0.50 packs/day for 10 years    Types: Cigarettes    Quit date: 09/01/2012  . Smokeless tobacco: Never Used  . Alcohol Use: No    OB History   Grav Para Term Preterm Abortions TAB SAB Ect Mult Living   4 2 1 1 2  2          Review of Systems  Gastrointestinal: Positive for abdominal pain.  All other systems reviewed and are negative.    Allergies  Celecoxib; Fentanyl; Ibuprofen; Ketorolac tromethamine;  Meperidine hcl; Morphine; Ondansetron; Shellfish allergy; Tramadol; Coconut flavor; Propoxyphene-acetaminophen; and Tylenol  Home Medications   Current Outpatient Rx  Name  Route  Sig  Dispense  Refill  . estradiol (ESTRACE) 2 MG tablet   Oral   Take 2 mg by mouth daily.         . penicillin v potassium (VEETID) 500 MG tablet   Oral   Take 500 mg by mouth 3 (three) times daily.           BP 136/86  Pulse 71  Temp(Src) 98.5 F (36.9 C) (Oral)  Resp 16  SpO2 99%  LMP 08/19/2012  Physical Exam  Nursing note and vitals reviewed. Constitutional: She is oriented to person, place, and time. She appears well-developed and well-nourished. No distress.  HENT:  Head: Normocephalic and atraumatic.  Neck: Normal range of motion. Neck supple.  Cardiovascular: Normal rate and regular rhythm.  Exam reveals no gallop and no friction rub.   No murmur heard. Pulmonary/Chest: Effort normal and breath sounds normal. No respiratory distress. She has no wheezes.  Abdominal: Soft. Bowel sounds are normal. She exhibits no distension.  There is ttp in the epigastric region without rebound or guarding.  Musculoskeletal: Normal range of motion.  Neurological: She is alert and oriented to person, place, and time.  Skin: Skin is warm and  dry. She is not diaphoretic.    ED Course  Procedures (including critical care time)  Labs Reviewed - No data to display Dg Abd Acute W/chest  10/31/2012  *RADIOLOGY REPORT*  Clinical Data: Left upper quadrant pain.  Nausea, vomiting.  ACUTE ABDOMEN SERIES (ABDOMEN 2 VIEW & CHEST 1 VIEW)  Comparison: 09/07/2012  Findings: The bowel gas pattern is normal.  There is no evidence of free intraperitoneal air.  No suspicious radio-opaque calculi or other significant radiographic abnormality is seen. Heart size and mediastinal contours are within normal limits.  Both lungs are clear.  Prior cholecystectomy.  IMPRESSION: No acute findings.   Original Report Authenticated  By: Charlett Nose, M.D.      1. Chronic abdominal pain       MDM  She was here yesterday and had labs performed and given repeat pain meds.  I do not feel as though another full workup and lengthy ED stay is going to help her situation.  I have agreed to give her an injection of dilaudid and phenergan, then she needs to go home.  I have again advised her to make arrangements for pain management or to see a GI doctor to help her with this.          Geoffery Lyons, MD 11/02/12 [phone]

## 2012-11-06 ENCOUNTER — Encounter (HOSPITAL_COMMUNITY): Payer: Self-pay | Admitting: Cardiology

## 2012-11-06 ENCOUNTER — Emergency Department (HOSPITAL_COMMUNITY)
Admission: EM | Admit: 2012-11-06 | Discharge: 2012-11-06 | Disposition: A | Discharge status: Home / Self Care | Payer: Medicaid Other | Source: Home / Self Care | Attending: Emergency Medicine | Admitting: Emergency Medicine

## 2012-11-06 ENCOUNTER — Emergency Department (HOSPITAL_COMMUNITY)
Admission: EM | Admit: 2012-11-06 | Discharge: 2012-11-07 | Disposition: A | Discharge status: Home / Self Care | Payer: Medicaid Other | Attending: Emergency Medicine | Admitting: Emergency Medicine

## 2012-11-06 ENCOUNTER — Encounter (HOSPITAL_COMMUNITY): Payer: Self-pay

## 2012-11-06 DIAGNOSIS — R1110 Vomiting, unspecified: Secondary | ICD-10-CM | POA: Insufficient documentation

## 2012-11-06 DIAGNOSIS — Z87891 Personal history of nicotine dependence: Secondary | ICD-10-CM | POA: Insufficient documentation

## 2012-11-06 DIAGNOSIS — G8929 Other chronic pain: Secondary | ICD-10-CM

## 2012-11-06 DIAGNOSIS — Z8742 Personal history of other diseases of the female genital tract: Secondary | ICD-10-CM | POA: Insufficient documentation

## 2012-11-06 DIAGNOSIS — R112 Nausea with vomiting, unspecified: Secondary | ICD-10-CM | POA: Insufficient documentation

## 2012-11-06 DIAGNOSIS — Z8719 Personal history of other diseases of the digestive system: Secondary | ICD-10-CM | POA: Insufficient documentation

## 2012-11-06 DIAGNOSIS — Z792 Long term (current) use of antibiotics: Secondary | ICD-10-CM | POA: Insufficient documentation

## 2012-11-06 DIAGNOSIS — Z79899 Other long term (current) drug therapy: Secondary | ICD-10-CM | POA: Insufficient documentation

## 2012-11-06 DIAGNOSIS — Z862 Personal history of diseases of the blood and blood-forming organs and certain disorders involving the immune mechanism: Secondary | ICD-10-CM | POA: Insufficient documentation

## 2012-11-06 DIAGNOSIS — R109 Unspecified abdominal pain: Secondary | ICD-10-CM

## 2012-11-06 DIAGNOSIS — Z9089 Acquired absence of other organs: Secondary | ICD-10-CM | POA: Insufficient documentation

## 2012-11-06 LAB — CBC WITH DIFFERENTIAL/PLATELET
Basophils Absolute: 0.1 10*3/uL (ref 0.0–0.1)
Lymphocytes Relative: 23 % (ref 12–46)
Neutro Abs: 8.4 10*3/uL — ABNORMAL HIGH (ref 1.7–7.7)
Neutrophils Relative %: 68 % (ref 43–77)
Platelets: 308 10*3/uL (ref 150–400)
RDW: 12.2 % (ref 11.5–15.5)
WBC: 12.5 10*3/uL — ABNORMAL HIGH (ref 4.0–10.5)

## 2012-11-06 LAB — COMPREHENSIVE METABOLIC PANEL
ALT: 25 U/L (ref 0–35)
AST: 22 U/L (ref 0–37)
CO2: 26 mEq/L (ref 19–32)
Calcium: 9.8 mg/dL (ref 8.4–10.5)
Chloride: 104 mEq/L (ref 96–112)
GFR calc non Af Amer: >90 mL/min (ref >90)
Sodium: 140 mEq/L (ref 135–145)
Total Bilirubin: 0.4 mg/dL (ref 0.3–1.2)

## 2012-11-06 MED ORDER — SODIUM CHLORIDE 0.9 % IV SOLN: Freq: Once | INTRAVENOUS | Status: DC

## 2012-11-06 MED ORDER — PANTOPRAZOLE SODIUM 40 MG PO TBEC
40.0000 mg | DELAYED_RELEASE_TABLET | Freq: Once | ORAL | Status: AC
  Administered 2012-11-06: 40 mg via ORAL
  Filled 2012-11-06: qty 1

## 2012-11-06 MED ORDER — HYDROMORPHONE HCL PF 1 MG/ML IJ SOLN
1.0000 mg | Freq: Once | INTRAMUSCULAR | Status: AC
  Administered 2012-11-06: 1 mg via INTRAMUSCULAR
  Filled 2012-11-06: qty 1

## 2012-11-06 MED ORDER — SUCRALFATE 1 GM/10ML PO SUSP: 1.0000 g | Freq: Four times a day (QID) | ORAL | Status: DC

## 2012-11-06 MED ORDER — PANTOPRAZOLE SODIUM 20 MG PO TBEC: 40.0000 mg | DELAYED_RELEASE_TABLET | Freq: Every day | ORAL | Status: DC
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
## 2012-11-06 NOTE — ED Notes (Signed)
Pt states she wants her blood work drawn with IV start

## 2012-11-06 NOTE — ED Notes (Signed)
Pt reports hx of pancreatitis, states she has had n/v for the past couple of weeks. States she has been vomiting blood. Denies any urinary symptoms. States that she has been unable to keep anything down for a few days.

## 2012-11-06 NOTE — ED Notes (Signed)
Phlebotomy attempt x1 for lab draw. RN to room to attempt. Pt sts "I want an IV no one needs to be sticking me again for blood" RN notified Abigail PA-C. No IV required. Rn attempt x1 unsuccessfully. Pt requesting to speak to CN. CN notified.

## 2012-11-06 NOTE — ED Notes (Signed)
Pt states she has been having problems with pancreatitis-states pain and vomiting for the past 4 weeks.  States had a stent placed last February and was removed in March.  Pt states it was done at [facility]. States she went back to her Careers adviser at [facility] last month and states she was told the "stent won't help any more". Pt waiting to get in with a primary care doctor per her social worker.

## 2012-11-07 LAB — URINALYSIS, ROUTINE W REFLEX MICROSCOPIC
Glucose, UA: NEGATIVE mg/dL
Hgb urine dipstick: NEGATIVE
Protein, ur: NEGATIVE mg/dL

## 2012-11-07 MED ORDER — PROMETHAZINE HCL 25 MG/ML IJ SOLN
25.0000 mg | Freq: Once | INTRAMUSCULAR | Status: DC
  Filled 2012-11-07: qty 1

## 2012-11-07 MED ORDER — HYDROMORPHONE HCL PF 1 MG/ML IJ SOLN
1.0000 mg | Freq: Once | INTRAMUSCULAR | Status: AC
  Administered 2012-11-07: 1 mg via INTRAMUSCULAR
  Filled 2012-11-07: qty 1

## 2012-11-07 MED ORDER — DIPHENHYDRAMINE HCL 50 MG/ML IJ SOLN
25.0000 mg | Freq: Once | INTRAMUSCULAR | Status: AC
  Administered 2012-11-07: 25 mg via INTRAMUSCULAR
  Filled 2012-11-07: qty 1

## 2012-11-07 NOTE — ED Provider Notes (Signed)
History     CSN: [id number]  Arrival date & time 11/06/12  2302   First MD Initiated Contact with Patient 11/07/12 0023      Chief Complaint  Patient presents with  . Abdominal Pain  . Emesis    (Consider location/radiation/quality/duration/timing/severity/associated sxs/prior treatment) Patient is a 32 y.o. female presenting with abdominal pain and vomiting. The history is provided by the patient.  Abdominal Pain Pain location:  Epigastric Pain quality: sharp   Pain radiates to:  RUQ Pain severity:  Mild Onset quality:  Gradual Timing:  Constant Progression:  Unchanged Chronicity:  Chronic Context: not alcohol use and not retching   Relieved by:  Nothing Worsened by:  Nothing tried Ineffective treatments:  None tried Associated symptoms: vomiting   Associated symptoms: no chills and no fever   Emesis Associated symptoms: abdominal pain   Associated symptoms: no chills    Pt with h/o chronic abd pain--seen 6 hours ago for same and had a negative workup--pt well known to me  Past Medical History  Diagnosis Date  . Pancreatitis   . S/P laparoscopic cholecystectomy   . Fibroids   . Ovarian cyst   . Chronic abdominal pain   . Anemia     Past Surgical History  Procedure Laterality Date  . Cholecystectomy    . Dilation and curettage of uterus    . Ercp w/ metal stent placement      Family History  Problem Relation Age of Onset  . Diabetes Mother   . Hypertension Mother   . Hypertension Father   . Diabetes Father   . Asthma Daughter     History  Substance Use Topics  . Smoking status: Former Smoker -- 0.50 packs/day for 10 years    Types: Cigarettes    Quit date: 09/01/2012  . Smokeless tobacco: Never Used  . Alcohol Use: No    OB History   Grav Para Term Preterm Abortions TAB SAB Ect Mult Living   4 2 1 1 2  2          Review of Systems  Constitutional: Negative for fever and chills.  Gastrointestinal: Positive for vomiting and abdominal pain.   All other systems reviewed and are negative.    Allergies  Celecoxib; Fentanyl; Ibuprofen; Ketorolac tromethamine; Meperidine hcl; Morphine; Ondansetron; Shellfish allergy; Tramadol; Coconut flavor; Propoxyphene-acetaminophen; and Tylenol  Home Medications   Current Outpatient Rx  Name  Route  Sig  Dispense  Refill  . diphenhydrAMINE (BENADRYL) 25 MG tablet   Oral   Take 25 mg by mouth every 8 (eight) hours as needed for itching.         . metoCLOPramide (REGLAN) 5 MG tablet   Oral   Take 5 mg by mouth 4 (four) times daily as needed. Nausea         . pantoprazole (PROTONIX) 20 MG tablet   Oral   Take 2 tablets (40 mg total) by mouth daily.   30 tablet   0   . sucralfate (CARAFATE) 1 GM/10ML suspension   Oral   Take 10 mLs (1 g total) by mouth 4 (four) times daily.   420 mL   0     BP 122/86  Pulse 91  Temp(Src) 98.5 F (36.9 C) (Oral)  Resp 18  Ht 5\' 5"  (1.651 m)  Wt 176 lb 6 oz (80.003 kg)  BMI 29.35 kg/m2  SpO2 99%  LMP 08/19/2012  Physical Exam  Nursing note and vitals reviewed. Constitutional: She is  oriented to person, place, and time. She appears well-developed and well-nourished.  Non-toxic appearance. No distress.  HENT:  Head: Normocephalic and atraumatic.  Eyes: Conjunctivae, EOM and lids are normal. Pupils are equal, round, and reactive to light.  Neck: Normal range of motion. Neck supple. No tracheal deviation present. No mass present.  Cardiovascular: Normal rate, regular rhythm and normal heart sounds.  Exam reveals no gallop.   No murmur heard. Pulmonary/Chest: Effort normal and breath sounds normal. No stridor. No respiratory distress. She has no decreased breath sounds. She has no wheezes. She has no rhonchi. She has no rales.  Abdominal: Soft. Normal appearance and bowel sounds are normal. She exhibits no distension. There is tenderness in the epigastric area. There is no rigidity, no rebound, no guarding and no CVA tenderness.   Musculoskeletal: Normal range of motion. She exhibits no edema and no tenderness.  Neurological: She is alert and oriented to person, place, and time. She has normal strength. No cranial nerve deficit or sensory deficit. GCS eye subscore is 4. GCS verbal subscore is 5. GCS motor subscore is 6.  Skin: Skin is warm and dry. No abrasion and no rash noted.  Psychiatric: She has a normal mood and affect. Her speech is normal and behavior is normal.    ED Course  Procedures (including critical care time)  Labs Reviewed  URINALYSIS, ROUTINE W REFLEX MICROSCOPIC   No results found.   No diagnosis found.    MDM  Pt without a surgical abd at this time, patient with recent labs and no reason to repeat them. Patient given medications for pain and instructed to followup with her surgeon as needed        Toy Baker, MD 11/07/12 [phone]

## 2012-11-08 ENCOUNTER — Encounter (HOSPITAL_COMMUNITY): Payer: Self-pay | Admitting: Emergency Medicine

## 2012-11-08 ENCOUNTER — Ambulatory Visit (INDEPENDENT_AMBULATORY_CARE_PROVIDER_SITE_OTHER): Payer: Self-pay | Admitting: General Surgery

## 2012-11-08 ENCOUNTER — Emergency Department (HOSPITAL_COMMUNITY)
Admission: EM | Admit: 2012-11-08 | Discharge: 2012-11-09 | Disposition: A | Discharge status: Home / Self Care | Payer: Medicaid Other | Attending: Emergency Medicine | Admitting: Emergency Medicine

## 2012-11-08 ENCOUNTER — Other Ambulatory Visit: Payer: Self-pay

## 2012-11-08 DIAGNOSIS — G8929 Other chronic pain: Secondary | ICD-10-CM | POA: Insufficient documentation

## 2012-11-08 DIAGNOSIS — Z8719 Personal history of other diseases of the digestive system: Secondary | ICD-10-CM | POA: Insufficient documentation

## 2012-11-08 DIAGNOSIS — Z79899 Other long term (current) drug therapy: Secondary | ICD-10-CM | POA: Insufficient documentation

## 2012-11-08 DIAGNOSIS — Z9089 Acquired absence of other organs: Secondary | ICD-10-CM | POA: Insufficient documentation

## 2012-11-08 DIAGNOSIS — R112 Nausea with vomiting, unspecified: Secondary | ICD-10-CM | POA: Insufficient documentation

## 2012-11-08 DIAGNOSIS — Z87891 Personal history of nicotine dependence: Secondary | ICD-10-CM | POA: Insufficient documentation

## 2012-11-08 DIAGNOSIS — Z8742 Personal history of other diseases of the female genital tract: Secondary | ICD-10-CM | POA: Insufficient documentation

## 2012-11-08 DIAGNOSIS — Z862 Personal history of diseases of the blood and blood-forming organs and certain disorders involving the immune mechanism: Secondary | ICD-10-CM | POA: Insufficient documentation

## 2012-11-08 LAB — CBC WITH DIFFERENTIAL/PLATELET
Basophils Absolute: 0.1 10*3/uL (ref 0.0–0.1)
Eosinophils Relative: 5 % (ref 0–5)
HCT: 35.7 % — ABNORMAL LOW (ref 36.0–46.0)
Lymphocytes Relative: 38 % (ref 12–46)
Lymphs Abs: 3.7 10*3/uL (ref 0.7–4.0)
MCH: 28 pg (ref 26.0–34.0)
MCV: 78.6 fL (ref 78.0–100.0)
Monocytes Absolute: 0.9 10*3/uL (ref 0.1–1.0)
RDW: 12.4 % (ref 11.5–15.5)
WBC: 9.8 10*3/uL (ref 4.0–10.5)

## 2012-11-08 MED ORDER — DIPHENHYDRAMINE HCL 50 MG/ML IJ SOLN
25.0000 mg | Freq: Once | INTRAMUSCULAR | Status: AC
  Administered 2012-11-08: 25 mg via INTRAVENOUS
  Filled 2012-11-08: qty 1

## 2012-11-08 MED ORDER — SODIUM CHLORIDE 0.9 % IV BOLUS (SEPSIS)
1000.0000 mL | Freq: Once | INTRAVENOUS | Status: AC
  Administered 2012-11-08: 1000 mL via INTRAVENOUS

## 2012-11-08 MED ORDER — HYDROMORPHONE HCL PF 1 MG/ML IJ SOLN
1.0000 mg | Freq: Once | INTRAMUSCULAR | Status: AC
  Administered 2012-11-08: 1 mg via INTRAVENOUS
  Filled 2012-11-08: qty 1

## 2012-11-08 MED ORDER — METOCLOPRAMIDE HCL 5 MG/ML IJ SOLN
10.0000 mg | Freq: Once | INTRAMUSCULAR | Status: AC
  Administered 2012-11-08: 10 mg via INTRAVENOUS
  Filled 2012-11-08: qty 2

## 2012-11-08 MED ORDER — PANTOPRAZOLE SODIUM 40 MG IV SOLR
40.0000 mg | Freq: Once | INTRAVENOUS | Status: AC
  Administered 2012-11-08: 40 mg via INTRAVENOUS
  Filled 2012-11-08: qty 40

## 2012-11-08 MED ORDER — ESOMEPRAZOLE MAGNESIUM 40 MG PO CPDR: 40.0000 mg | DELAYED_RELEASE_CAPSULE | Freq: Every day | ORAL | Status: DC

## 2012-11-08 MED ORDER — HYDROXYZINE HCL 10 MG PO TABS
10.0000 mg | ORAL_TABLET | Freq: Once | ORAL | Status: DC
  Filled 2012-11-08: qty 1

## 2012-11-08 MED ORDER — DEXAMETHASONE SODIUM PHOSPHATE 10 MG/ML IJ SOLN
10.0000 mg | Freq: Once | INTRAMUSCULAR | Status: DC
  Filled 2012-11-08: qty 1

## 2012-11-08 NOTE — ED Notes (Signed)
PT. REPORTS CHRONIC LUQ PAIN FOR SEVERAL YEARS WITH NAUSEA/VOMITTING/DIARRHEA/DIZZINESS/HEADACHE /GENERALIZED WEAKNESS UNRELIEVED BY EMERGENCY ROOM VISITS.

## 2012-11-08 NOTE — ED Provider Notes (Signed)
History     CSN: [id number]  Arrival date & time 11/08/12  1925   First MD Initiated Contact with Patient 11/08/12 2013      Chief Complaint  Patient presents with  . Abdominal Pain   HPI  History provided by the patient and previous medical charts. Patient is a 32 year old female with history of chronic abdominal pains and pancreatitis who presents with complaints of typical of her chronic abdominal pain with nausea vomiting. She was seen in emergency room 2 days ago for similar. She reports intermittent times of improvement but has had worsened pain today with nausea vomiting. She denies any other aggravating or alleviating factors. Home medications were not helping with symptoms. On one of patient's most recent visits she was given prescriptions for Carafate proton next but states she did not like this medicines and has not been taking this regularly. Patient was seen earlier in the day by her primary care provider and her surgery specialist and states she complained of these symptoms to them. She denies any other associated symptoms. Denies fever, chills or sweats. No diarrhea or constipation. No urinary complaints. No vaginal bleeding or discharge.     Past Medical History  Diagnosis Date  . Pancreatitis   . S/P laparoscopic cholecystectomy   . Fibroids   . Ovarian cyst   . Chronic abdominal pain   . Anemia     Past Surgical History  Procedure Laterality Date  . Cholecystectomy    . Dilation and curettage of uterus    . Ercp w/ metal stent placement      Family History  Problem Relation Age of Onset  . Diabetes Mother   . Hypertension Mother   . Hypertension Father   . Diabetes Father   . Asthma Daughter     History  Substance Use Topics  . Smoking status: Former Smoker -- 0.50 packs/day for 10 years    Types: Cigarettes    Quit date: 09/01/2012  . Smokeless tobacco: Never Used  . Alcohol Use: No    OB History   Grav Para Term Preterm Abortions TAB SAB Ect  Mult Living   4 2 1 1 2  2          Review of Systems  Constitutional: Negative for fever, chills and diaphoresis.  Respiratory: Negative for shortness of breath.   Cardiovascular: Negative for chest pain.  Gastrointestinal: Positive for nausea, vomiting and abdominal pain.  Genitourinary: Negative for dysuria, frequency, hematuria, flank pain, vaginal bleeding and vaginal discharge.  All other systems reviewed and are negative.    Allergies  Celecoxib; Fentanyl; Ibuprofen; Ketorolac tromethamine; Meperidine hcl; Morphine; Ondansetron; Shellfish allergy; Tramadol; Coconut flavor; Propoxyphene-acetaminophen; and Tylenol  Home Medications   Current Outpatient Rx  Name  Route  Sig  Dispense  Refill  . diphenhydrAMINE (BENADRYL) 25 MG tablet   Oral   Take 25 mg by mouth every 8 (eight) hours as needed for itching.         . metoCLOPramide (REGLAN) 5 MG tablet   Oral   Take 5 mg by mouth 4 (four) times daily as needed. Nausea         . oxyCODONE-acetaminophen (PERCOCET/ROXICET) 5-325 MG per tablet   Oral   Take 1 tablet by mouth every 4 (four) hours as needed for pain.         . pantoprazole (PROTONIX) 20 MG tablet   Oral   Take 2 tablets (40 mg total) by mouth daily.   30  tablet   0   . sucralfate (CARAFATE) 1 GM/10ML suspension   Oral   Take 10 mLs (1 g total) by mouth 4 (four) times daily.   420 mL   0     BP 137/67  Pulse 97  Temp(Src) 98.2 F (36.8 C) (Oral)  Resp 19  SpO2 99%  LMP 08/19/2012  Physical Exam  Nursing note and vitals reviewed. Constitutional: She is oriented to person, place, and time. She appears well-developed and well-nourished. No distress.  HENT:  Head: Normocephalic.  Cardiovascular: Normal rate and regular rhythm.   Pulmonary/Chest: Effort normal and breath sounds normal. No respiratory distress. She has no wheezes.  Abdominal: Soft. There is tenderness in the left upper quadrant. There is no rebound and no guarding.   Musculoskeletal: Normal range of motion.  Neurological: She is alert and oriented to person, place, and time.  Skin: Skin is warm and dry. No rash noted.  Psychiatric: She has a normal mood and affect. Her behavior is normal.    ED Course  Procedures   Results for orders placed during the hospital encounter of 11/08/12  CBC WITH DIFFERENTIAL      Result Value Range   WBC 9.8  4.0 - 10.5 K/uL   RBC 4.54  3.87 - 5.11 MIL/uL   Hemoglobin 12.7  12.0 - 15.0 g/dL   HCT 47.8 (*) 29.5 - 62.1 %   MCV 78.6  78.0 - 100.0 fL   MCH 28.0  26.0 - 34.0 pg   MCHC 35.6  30.0 - 36.0 g/dL   RDW 30.8  65.7 - 84.6 %   Platelets 279  150 - 400 K/uL   Neutrophils Relative 47  43 - 77 %   Neutro Abs 4.6  1.7 - 7.7 K/uL   Lymphocytes Relative 38  12 - 46 %   Lymphs Abs 3.7  0.7 - 4.0 K/uL   Monocytes Relative 9  3 - 12 %   Monocytes Absolute 0.9  0.1 - 1.0 K/uL   Eosinophils Relative 5  0 - 5 %   Eosinophils Absolute 0.5  0.0 - 0.7 K/uL   Basophils Relative 1  0 - 1 %   Basophils Absolute 0.1  0.0 - 0.1 K/uL  COMPREHENSIVE METABOLIC PANEL      Result Value Range   Sodium 141  135 - 145 mEq/L   Potassium 5.5 (*) 3.5 - 5.1 mEq/L   Chloride 105  96 - 112 mEq/L   CO2 22  19 - 32 mEq/L   Glucose, Bld 98  70 - 99 mg/dL   BUN 12  6 - 23 mg/dL   Creatinine, Ser 9.62  0.50 - 1.10 mg/dL   Calcium 9.1  8.4 - 95.2 mg/dL   Total Protein 8.0  6.0 - 8.3 g/dL   Albumin 3.7  3.5 - 5.2 g/dL   AST 36  0 - 37 U/L   ALT 27  0 - 35 U/L   Alkaline Phosphatase 74  39 - 117 U/L   Total Bilirubin 0.2 (*) 0.3 - 1.2 mg/dL   GFR calc non Af Amer >90  >90 mL/min   GFR calc Af Amer >90  >90 mL/min  LIPASE, BLOOD      Result Value Range   Lipase 35  11 - 59 U/L      1. Chronic pain       MDM  Patient seen and evaluated. Patient sitting in bed and appears comfortable without signs of  significant pain or distress. Patient with multiple frequent visits to emergency room. She has a soft abdomen without concerns  for any acute process. Labs unremarkable. Will discharge.  Pt without any emesis in ED.  Potassium slightly elevated by sample was hemolyzed.  She had normal K 2 days ago.  Doubt finding today is real.  No concerning EKG findings.  At this time pt stable for d/c home.    Date: 11/08/2012  Rate: 90  Rhythm: normal sinus rhythm  QRS Axis: normal  Intervals: normal  ST/T Wave abnormalities: nonspecific ST/T changes  Conduction Disutrbances:none  Narrative Interpretation: LVH  Old EKG Reviewed: unchanged     Angus Seller, PA-C 11/09/12 0017

## 2012-11-09 LAB — COMPREHENSIVE METABOLIC PANEL
CO2: 22 mEq/L (ref 19–32)
Calcium: 9.1 mg/dL (ref 8.4–10.5)
Creatinine, Ser: 0.72 mg/dL (ref 0.50–1.10)
GFR calc Af Amer: >90 mL/min (ref >90)
GFR calc non Af Amer: >90 mL/min (ref >90)
Glucose, Bld: 98 mg/dL (ref 70–99)

## 2012-11-09 NOTE — ED Provider Notes (Signed)
Medical screening examination/treatment/procedure(s) were performed by non-physician practitioner and as supervising physician I was immediately available for consultation/collaboration.    Celene Kras, MD 11/09/12 [phone]

## 2012-11-10 NOTE — ED Provider Notes (Signed)
Medical screening examination/treatment/procedure(s) were performed by non-physician practitioner and as supervising physician I was immediately available for consultation/collaboration.  Geoffery Lyons, MD 11/10/12 0530

## 2012-11-14 ENCOUNTER — Encounter (HOSPITAL_COMMUNITY): Payer: Self-pay | Admitting: Emergency Medicine

## 2012-11-14 ENCOUNTER — Emergency Department (HOSPITAL_COMMUNITY)
Admission: EM | Admit: 2012-11-14 | Discharge: 2012-11-15 | Disposition: A | Discharge status: Home / Self Care | Payer: Medicaid Other | Attending: Emergency Medicine | Admitting: Emergency Medicine

## 2012-11-14 DIAGNOSIS — Z862 Personal history of diseases of the blood and blood-forming organs and certain disorders involving the immune mechanism: Secondary | ICD-10-CM | POA: Insufficient documentation

## 2012-11-14 DIAGNOSIS — G8929 Other chronic pain: Secondary | ICD-10-CM

## 2012-11-14 DIAGNOSIS — Z8742 Personal history of other diseases of the female genital tract: Secondary | ICD-10-CM | POA: Insufficient documentation

## 2012-11-14 DIAGNOSIS — Z8719 Personal history of other diseases of the digestive system: Secondary | ICD-10-CM | POA: Insufficient documentation

## 2012-11-14 DIAGNOSIS — Z9089 Acquired absence of other organs: Secondary | ICD-10-CM | POA: Insufficient documentation

## 2012-11-14 DIAGNOSIS — R1013 Epigastric pain: Secondary | ICD-10-CM | POA: Insufficient documentation

## 2012-11-14 DIAGNOSIS — Z87891 Personal history of nicotine dependence: Secondary | ICD-10-CM | POA: Insufficient documentation

## 2012-11-14 MED ORDER — HYDROMORPHONE HCL PF 1 MG/ML IJ SOLN
1.0000 mg | INTRAMUSCULAR | Status: AC
  Administered 2012-11-14: 1 mg via INTRAVENOUS
  Filled 2012-11-14: qty 1

## 2012-11-14 MED ORDER — SODIUM CHLORIDE 0.9 % IV BOLUS (SEPSIS)
500.0000 mL | Freq: Once | INTRAVENOUS | Status: AC
  Administered 2012-11-14: 500 mL via INTRAVENOUS

## 2012-11-14 MED ORDER — PROMETHAZINE HCL 25 MG/ML IJ SOLN
25.0000 mg | Freq: Once | INTRAMUSCULAR | Status: AC
  Administered 2012-11-15: 25 mg via INTRAVENOUS
  Filled 2012-11-14: qty 1

## 2012-11-14 MED ORDER — DIPHENHYDRAMINE HCL 50 MG/ML IJ SOLN
25.0000 mg | Freq: Once | INTRAMUSCULAR | Status: AC
  Administered 2012-11-15: 25 mg via INTRAVENOUS
  Filled 2012-11-14: qty 1

## 2012-11-14 NOTE — ED Provider Notes (Signed)
History     CSN: [id number]  Arrival date & time 11/14/12  1943   First MD Initiated Contact with Patient 11/14/12 2053      Chief Complaint  Patient presents with  . Abdominal Pain    HPI [name] is a 32 y.o. female with a h/o of chronic idiopathic pancreatitis and chronic abdominal pain.  Pt has had multiple visits, labs and CT scans of her abdomen and pelvis without exact diagnosis.  PT is seeing Hedwig Morton FNP who is trying to get her into a chronic pain management clinic.  She is also seeing Dr. Carolynne Edouard from [facility] for possible surgical options.  She has had ECRP with stenting of her pancreatic duct at [facility], but she recently had a stent removed in March for bleeding.  Since then the pain has persisted and worsened over past few days with little relief from pain medicine.  Pt pain is in the epigastrium, radiates to the back, feels like typically pancreas pain, associated with nausea, "spitting" no vomiting, no diarrhea, chest pain, SOB, fevers or chills.  Denies URI symptoms or UTI symptoms. t Past Medical History  Diagnosis Date  . Pancreatitis   . S/P laparoscopic cholecystectomy   . Fibroids   . Ovarian cyst   . Chronic abdominal pain   . Anemia     Past Surgical History  Procedure Laterality Date  . Cholecystectomy    . Dilation and curettage of uterus    . Ercp w/ metal stent placement      Family History  Problem Relation Age of Onset  . Diabetes Mother   . Hypertension Mother   . Hypertension Father   . Diabetes Father   . Asthma Daughter     History  Substance Use Topics  . Smoking status: Former Smoker -- 0.50 packs/day for 10 years    Types: Cigarettes    Quit date: 09/01/2012  . Smokeless tobacco: Never Used  . Alcohol Use: No    OB History   Grav Para Term Preterm Abortions TAB SAB Ect Mult Living   4 2 1 1 2  2          Review of Systems At least 10pt or greater review of systems completed and are negative except where specified in the  HPI.  Allergies  Celecoxib; Fentanyl; Ibuprofen; Ketorolac tromethamine; Meperidine hcl; Morphine; Ondansetron; Shellfish allergy; Tramadol; Coconut flavor; Propoxyphene-acetaminophen; and Tylenol  Home Medications   Current Outpatient Rx  Name  Route  Sig  Dispense  Refill  . diphenhydrAMINE (BENADRYL) 25 MG tablet   Oral   Take 25 mg by mouth every 8 (eight) hours as needed for itching.         . metoCLOPramide (REGLAN) 5 MG tablet   Oral   Take 5 mg by mouth 4 (four) times daily as needed. Nausea           BP 133/78  Pulse 108  Temp(Src) 98.3 F (36.8 C) (Oral)  Resp 16  SpO2 100%  LMP 08/19/2012  Physical Exam  Nursing notes reviewed.  Electronic medical record reviewed. VITAL SIGNS:   Filed Vitals:   11/14/12 1947 11/14/12 2355  BP: 133/78 122/89  Pulse: 108 78  Temp: 98.3 F (36.8 C) 98.1 F (36.7 C)  TempSrc: Oral Oral  Resp: 16 18  SpO2: 100% 100%   CONSTITUTIONAL: Awake, oriented, appears non-toxic HENT: Atraumatic, normocephalic, oral mucosa pink and moist, airway patent. Nares patent without drainage. External ears normal. EYES:  Conjunctiva clear, EOMI, PERRLA NECK: Trachea midline, non-tender, supple CARDIOVASCULAR: Normal heart rate, Normal rhythm, No murmurs, rubs, gallops PULMONARY/CHEST: Clear to auscultation, no rhonchi, wheezes, or rales. Symmetrical breath sounds. Non-tender. ABDOMINAL: Non-distended, soft, tender in the epigastrium without rebound or guarding.  BS normal. NEUROLOGIC: Non-focal, moving all four extremities, no gross sensory or motor deficits. EXTREMITIES: No clubbing, cyanosis, or edema SKIN: Warm, Dry, No erythema, No rash  ED Course  Procedures (including critical care time)  Labs Reviewed - No data to display No results found.   1. Chronic epigastric pain       MDM  Pt presents with chronic epigastric pain.  Discussed any changes in pt presentation with pt - she thinks it is her typical pain.  We have agreed  that further laboratory testing or imaging is not needed right now.  We also had a discussion about not being prescribed narcotics out of the ED - patient is very reasonable and understands why ER docs will not refill her narcotics.  She has a follow up with Dr. Carolynne Edouard in 7 days.  Will treat pain and have her f/u as OP.  No surgical or medical emergency suspected.        Jones Skene, MD 11/15/12 [phone]

## 2012-11-14 NOTE — ED Notes (Addendum)
PT. REPORTS CHRONIC GENERALIZED ABDOMINAL PAIN FOR SEVERAL YEARS WITH NAUSEA,VOMITTING AND DIARRHEA . RESPIRATIONS UNLABORED . NO CHEST PAIN . DENIES FEVER OR CHILLS.

## 2012-11-15 ENCOUNTER — Encounter (HOSPITAL_COMMUNITY): Payer: Self-pay

## 2012-11-15 ENCOUNTER — Emergency Department (HOSPITAL_COMMUNITY)
Admission: EM | Admit: 2012-11-15 | Discharge: 2012-11-16 | Disposition: A | Discharge status: Home / Self Care | Payer: Medicaid Other | Attending: Emergency Medicine | Admitting: Emergency Medicine

## 2012-11-15 DIAGNOSIS — D649 Anemia, unspecified: Secondary | ICD-10-CM | POA: Insufficient documentation

## 2012-11-15 DIAGNOSIS — Z885 Allergy status to narcotic agent status: Secondary | ICD-10-CM | POA: Insufficient documentation

## 2012-11-15 DIAGNOSIS — B852 Pediculosis, unspecified: Secondary | ICD-10-CM | POA: Insufficient documentation

## 2012-11-15 DIAGNOSIS — Z8719 Personal history of other diseases of the digestive system: Secondary | ICD-10-CM | POA: Insufficient documentation

## 2012-11-15 DIAGNOSIS — R109 Unspecified abdominal pain: Secondary | ICD-10-CM | POA: Insufficient documentation

## 2012-11-15 DIAGNOSIS — Z79899 Other long term (current) drug therapy: Secondary | ICD-10-CM | POA: Insufficient documentation

## 2012-11-15 DIAGNOSIS — F4321 Adjustment disorder with depressed mood: Secondary | ICD-10-CM | POA: Insufficient documentation

## 2012-11-15 DIAGNOSIS — Z888 Allergy status to other drugs, medicaments and biological substances status: Secondary | ICD-10-CM | POA: Insufficient documentation

## 2012-11-15 DIAGNOSIS — G8929 Other chronic pain: Secondary | ICD-10-CM | POA: Insufficient documentation

## 2012-11-15 DIAGNOSIS — Z87891 Personal history of nicotine dependence: Secondary | ICD-10-CM | POA: Insufficient documentation

## 2012-11-15 MED ORDER — DIPHENHYDRAMINE HCL 50 MG/ML IJ SOLN
INTRAMUSCULAR | Status: AC
  Filled 2012-11-15: qty 1

## 2012-11-15 MED ORDER — HYDROMORPHONE HCL PF 1 MG/ML IJ SOLN
INTRAMUSCULAR | Status: AC
  Filled 2012-11-15: qty 1

## 2012-11-15 MED ORDER — LORAZEPAM 1 MG PO TABS: 1.0000 mg | ORAL_TABLET | Freq: Every evening | ORAL | Status: DC | PRN

## 2012-11-15 MED ORDER — LORAZEPAM 2 MG/ML IJ SOLN
1.0000 mg | Freq: Once | INTRAMUSCULAR | Status: AC
  Administered 2012-11-16: 1 mg via INTRAMUSCULAR
  Filled 2012-11-15: qty 1

## 2012-11-15 NOTE — ED Notes (Signed)
Patient presents reporting she cannot sleep due to thoughts of her deceased grandmother.  Patient reports her grandmother passed away tonight at 6pm and patient has been unable to sleep due to the sad circumstances.  Patient states "if someone could give me a shot to help me sleep, I would feel better."  Patient denies SI/HI.  Patient reports she's never felt like this before.

## 2012-11-15 NOTE — ED Provider Notes (Addendum)
History     CSN: [id number]  Arrival date & time 11/15/12  2310   First MD Initiated Contact with Patient 11/15/12 2343      Chief Complaint  Patient presents with  . Insomnia    Lost family member today, cannot sleep    (Consider location/radiation/quality/duration/timing/severity/associated sxs/prior treatment) HPI Comments: States her grandmother died in her arms tonight and she can't get the image out of her mind   The history is provided by the patient.    Past Medical History  Diagnosis Date  . Pancreatitis   . S/P laparoscopic cholecystectomy   . Fibroids   . Ovarian cyst   . Chronic abdominal pain   . Anemia     Past Surgical History  Procedure Laterality Date  . Cholecystectomy    . Dilation and curettage of uterus    . Ercp w/ metal stent placement      Family History  Problem Relation Age of Onset  . Diabetes Mother   . Hypertension Mother   . Hypertension Father   . Diabetes Father   . Asthma Daughter     History  Substance Use Topics  . Smoking status: Former Smoker -- 0.50 packs/day for 10 years    Types: Cigarettes    Quit date: 09/01/2012  . Smokeless tobacco: Never Used  . Alcohol Use: No    OB History   Grav Para Term Preterm Abortions TAB SAB Ect Mult Living   4 2 1 1 2  2          Review of Systems  Constitutional: Negative for activity change.  Psychiatric/Behavioral: Positive for sleep disturbance. The patient is nervous/anxious.   All other systems reviewed and are negative.    Allergies  Celecoxib; Fentanyl; Ibuprofen; Ketorolac tromethamine; Meperidine hcl; Morphine; Ondansetron; Shellfish allergy; Tramadol; Coconut flavor; Propoxyphene-acetaminophen; and Tylenol  Home Medications   Current Outpatient Rx  Name  Route  Sig  Dispense  Refill  . diphenhydrAMINE (BENADRYL) 25 MG tablet   Oral   Take 25 mg by mouth every 8 (eight) hours as needed for itching.         Marland Kitchen LORazepam (ATIVAN) 1 MG tablet   Oral   Take 1  tablet (1 mg total) by mouth at bedtime as needed for anxiety.   10 tablet   0   . metoCLOPramide (REGLAN) 5 MG tablet   Oral   Take 5 mg by mouth 4 (four) times daily as needed. Nausea           BP 129/78  Pulse 97  Temp(Src) 98.3 F (36.8 C)  Resp 20  SpO2 100%  LMP 08/19/2012  Physical Exam  Nursing note and vitals reviewed. Constitutional: She is oriented to person, place, and time. She appears well-developed and well-nourished.  HENT:  Head: Normocephalic.  Eyes: Pupils are equal, round, and reactive to light.  Neck: Normal range of motion.  Cardiovascular: Normal rate.   Pulmonary/Chest: Effort normal.  Neurological: She is alert and oriented to person, place, and time.  Skin: Skin is warm.  Psychiatric: Her speech is normal and behavior is normal. Judgment and thought content normal. Cognition and memory are normal. She exhibits a depressed mood.    ED Course  Procedures (including critical care time)  Labs Reviewed - No data to display No results found.   1. Grief reaction       MDM   Will give Ativan for the next few days patien tot FU with her  PCP if further assistance needed         Arman Filter, NP 11/16/12 0000  Arman Filter, NP 11/16/12 [phone]

## 2012-11-16 NOTE — ED Provider Notes (Signed)
Medical screening examination/treatment/procedure(s) were performed by non-physician practitioner and as supervising physician I was immediately available for consultation/collaboration.  Sunnie Nielsen, MD 11/16/12 [phone]

## 2012-11-16 NOTE — ED Notes (Signed)
Pt states she has not been able sleep due to her grandma passing today at 6 pm in patient arms. Pt states she is stressed out and needs some relief

## 2012-11-16 NOTE — ED Provider Notes (Signed)
Medical screening examination/treatment/procedure(s) were performed by non-physician practitioner and as supervising physician I was immediately available for consultation/collaboration.  Sunnie Nielsen, MD 11/16/12 [phone]

## 2012-11-20 ENCOUNTER — Emergency Department (HOSPITAL_COMMUNITY)
Admission: EM | Admit: 2012-11-20 | Discharge: 2012-11-21 | Disposition: A | Discharge status: Home / Self Care | Payer: Medicaid Other | Attending: Emergency Medicine | Admitting: Emergency Medicine

## 2012-11-20 ENCOUNTER — Encounter (HOSPITAL_COMMUNITY): Payer: Self-pay | Admitting: Emergency Medicine

## 2012-11-20 DIAGNOSIS — R1013 Epigastric pain: Secondary | ICD-10-CM | POA: Insufficient documentation

## 2012-11-20 DIAGNOSIS — L293 Anogenital pruritus, unspecified: Secondary | ICD-10-CM | POA: Insufficient documentation

## 2012-11-20 DIAGNOSIS — L723 Sebaceous cyst: Secondary | ICD-10-CM | POA: Insufficient documentation

## 2012-11-20 DIAGNOSIS — Z862 Personal history of diseases of the blood and blood-forming organs and certain disorders involving the immune mechanism: Secondary | ICD-10-CM | POA: Insufficient documentation

## 2012-11-20 DIAGNOSIS — G44209 Tension-type headache, unspecified, not intractable: Secondary | ICD-10-CM | POA: Insufficient documentation

## 2012-11-20 DIAGNOSIS — Q181 Preauricular sinus and cyst: Secondary | ICD-10-CM

## 2012-11-20 DIAGNOSIS — Z8742 Personal history of other diseases of the female genital tract: Secondary | ICD-10-CM | POA: Insufficient documentation

## 2012-11-20 DIAGNOSIS — K861 Other chronic pancreatitis: Secondary | ICD-10-CM | POA: Insufficient documentation

## 2012-11-20 DIAGNOSIS — N898 Other specified noninflammatory disorders of vagina: Secondary | ICD-10-CM | POA: Insufficient documentation

## 2012-11-20 DIAGNOSIS — Z9089 Acquired absence of other organs: Secondary | ICD-10-CM | POA: Insufficient documentation

## 2012-11-20 DIAGNOSIS — Z87891 Personal history of nicotine dependence: Secondary | ICD-10-CM | POA: Insufficient documentation

## 2012-11-20 DIAGNOSIS — G8929 Other chronic pain: Secondary | ICD-10-CM | POA: Insufficient documentation

## 2012-11-20 NOTE — ED Notes (Signed)
NURSE FIRST ROUNDS : PT. SITTING AT WAITING AREA WITH NO DISTRESS , RESPIRATIONS UNLABORED , NURSE EXPLAINED DELAY/ PROCESS .

## 2012-11-20 NOTE — ED Notes (Signed)
PT. REPORTS CHRONIC GENERALIZED ABDOMINAL PAIN FOR SEVERAL YEARS WITH VOMITTING AND DIARRHEA UNRELIEVED BY PRESCRIPTION MEDICATIONS , DENIES FEVER OR CHILLS , RESPIRATIONS UNLABORED /AMBULATORY.

## 2012-11-20 NOTE — ED Notes (Signed)
Pt states is having abdominal pain since last Thursday with N/V/D.Pt states its may be a pancreas flair up. Pain 10/10. Pt states she is also having discharge that is irrigating to the patient.

## 2012-11-21 ENCOUNTER — Ambulatory Visit (INDEPENDENT_AMBULATORY_CARE_PROVIDER_SITE_OTHER): Payer: Self-pay | Admitting: General Surgery

## 2012-11-21 LAB — GC/CHLAMYDIA PROBE AMP
CT Probe RNA: NEGATIVE
GC Probe RNA: NEGATIVE

## 2012-11-21 MED ORDER — HYDROMORPHONE HCL PF 1 MG/ML IJ SOLN
1.0000 mg | Freq: Once | INTRAMUSCULAR | Status: AC
  Administered 2012-11-21: 1 mg via INTRAVENOUS
  Filled 2012-11-21: qty 1

## 2012-11-21 MED ORDER — DIPHENHYDRAMINE HCL 50 MG/ML IJ SOLN
25.0000 mg | Freq: Once | INTRAMUSCULAR | Status: AC
  Administered 2012-11-21: 25 mg via INTRAVENOUS
  Filled 2012-11-21: qty 1

## 2012-11-21 MED ORDER — HYDROMORPHONE HCL PF 2 MG/ML IJ SOLN
1.5000 mg | Freq: Once | INTRAMUSCULAR | Status: AC
  Administered 2012-11-21: 1.5 mg via INTRAVENOUS
  Filled 2012-11-21: qty 1

## 2012-11-21 MED ORDER — PROMETHAZINE HCL 25 MG/ML IJ SOLN
25.0000 mg | Freq: Once | INTRAMUSCULAR | Status: AC
  Administered 2012-11-21: 25 mg via INTRAVENOUS
  Filled 2012-11-21: qty 1

## 2012-11-21 MED ORDER — FLUCONAZOLE 100 MG PO TABS
200.0000 mg | ORAL_TABLET | Freq: Once | ORAL | Status: AC
  Administered 2012-11-21: 200 mg via ORAL
  Filled 2012-11-21: qty 2

## 2012-11-21 NOTE — ED Notes (Signed)
IV team notified patient needed an IV.

## 2012-11-21 NOTE — ED Notes (Signed)
Pelvic cart at bedside. MD notified

## 2012-11-21 NOTE — ED Provider Notes (Signed)
History     CSN: [id number]  Arrival date & time 11/20/12  2049   First MD Initiated Contact with Patient 11/21/12 0033      Chief Complaint  Patient presents with  . Abdominal Pain   HPI [name] is a 32 y.o. female history of chronic pancreatitis pain who I am familiar with, presents to the ER with epigastric pain she feels is related to her chronic pancreatitis. It has not changed. It is been associated with vomiting and diarrhea, she says this is typical when her pancreatitis flares up. She's had no fevers, chills, chest pain, shortness of breath, cough, flank pain or dysuria. Patient had some vaginal itching and thin white discharge. She's been using a topical cream on her vaginal area this has not resolved.  She also complains about some drainage from the right ear. Patient's pain in her epigastrium is 10 out of 10, it is sharp, radiates to the back.  Also complaining about a 10 out of 10 squeezing throbbing headache in the frontal region of her head, not associated with photophobia, his headache gradually came on after the onset of her abdominal pain.    Past Medical History  Diagnosis Date  . Pancreatitis   . S/P laparoscopic cholecystectomy   . Fibroids   . Ovarian cyst   . Chronic abdominal pain   . Anemia     Past Surgical History  Procedure Laterality Date  . Cholecystectomy    . Dilation and curettage of uterus    . Ercp w/ metal stent placement      Family History  Problem Relation Age of Onset  . Diabetes Mother   . Hypertension Mother   . Hypertension Father   . Diabetes Father   . Asthma Daughter     History  Substance Use Topics  . Smoking status: Former Smoker -- 0.50 packs/day for 10 years    Types: Cigarettes    Quit date: 09/01/2012  . Smokeless tobacco: Never Used  . Alcohol Use: No    OB History   Grav Para Term Preterm Abortions TAB SAB Ect Mult Living   4 2 1 1 2  2          Review of Systems At least 10pt or greater review of  systems completed and are negative except where specified in the HPI.  Allergies  Celecoxib; Fentanyl; Ibuprofen; Ketorolac tromethamine; Meperidine hcl; Morphine; Ondansetron; Shellfish allergy; Tramadol; Coconut flavor; Propoxyphene-acetaminophen; and Tylenol  Home Medications  No current outpatient prescriptions on file.  BP 118/69  Pulse 92  Temp(Src) 99 F (37.2 C) (Oral)  Resp 14  SpO2 97%  LMP 08/19/2012  Physical Exam  Nursing notes reviewed.  Electronic medical record reviewed. VITAL SIGNS:   Filed Vitals:   11/20/12 2059  BP: 118/69  Pulse: 92  Temp: 99 F (37.2 C)  TempSrc: Oral  Resp: 14  SpO2: 97%   CONSTITUTIONAL: Awake, oriented, appears non-toxic HENT: Atraumatic, normocephalic, oral mucosa pink and moist, airway patent. Nares patent without drainage. Left external ear normal, right external ear shows the cyst versus small abscess on the intertragic incisure. TMs are clear bilaterally EYES: Conjunctiva clear, EOMI, PERRLA NECK: Trachea midline, non-tender, supple CARDIOVASCULAR: Normal heart rate, Normal rhythm, No murmurs, rubs, gallops PULMONARY/CHEST: Clear to auscultation, no rhonchi, wheezes, or rales. Symmetrical breath sounds. Non-tender. ABDOMINAL: Non-distended, soft, non-tender -mild tenderness to palpation in the epigastrium without rebound or guarding.  BS normal. NEUROLOGIC: Non-focal, moving all four extremities, no gross  sensory or motor deficits. EXTREMITIES: No clubbing, cyanosis, or edema SKIN: Warm, Dry, No erythema, No rash  ED Course  Procedures (including critical care time)  Labs Reviewed  WET PREP, GENITAL - Abnormal; Notable for the following:    Yeast Wet Prep HPF POC MANY (*)    WBC, Wet Prep HPF POC MODERATE (*)    All other components within normal limits  GC/CHLAMYDIA PROBE AMP  URINALYSIS, ROUTINE W REFLEX MICROSCOPIC   No results found.   1. Abdominal pain, chronic, epigastric   2. Cyst on ear   3. Tension  headache       MDM  She presents with chronic pancreatitis pain as well as headache and small cyst versus abscess on her external ear. We'll treat the patient's pain in the emergency department, again I have told her that I will not prescribe her narcotic pain medicines to go home with, she still in the process of obtaining a chronic pain management physician. Patient does have a appointment with her surgeon Dr. Carolynne Edouard tomorrow. Pelvic exam shows a cottage cheeselike discharge suspicious for vaginal yeast infection, confirmed on wet prep we'll treat with oral fluconazole. Patient declines drainage of her cyst, is likely epidermoid cyst would require enucleation anyway, does not appear to be an abscess. Patient has followup with Dr. Carolynne Edouard if she changes her mind later today for chronic abdominal pain.  Patient is discharged home stable and in good condition with chronic abdominal pain. No prescriptions will be given for narcotics.  I explained the diagnosis and have given explicit precautions to return to the ER including any other new or worsening symptoms. The patient understands and accepts the medical plan as it's been dictated and I have answered their questions. Discharge instructions concerning home care and prescriptions have been given.  The patient is STABLE and is discharged to home in good condition.            Jones Skene, MD 11/21/12 [phone]

## 2012-11-22 ENCOUNTER — Encounter (HOSPITAL_COMMUNITY): Payer: Self-pay | Admitting: Emergency Medicine

## 2012-11-22 DIAGNOSIS — Z8719 Personal history of other diseases of the digestive system: Secondary | ICD-10-CM | POA: Insufficient documentation

## 2012-11-22 DIAGNOSIS — R112 Nausea with vomiting, unspecified: Secondary | ICD-10-CM | POA: Insufficient documentation

## 2012-11-22 DIAGNOSIS — Z9089 Acquired absence of other organs: Secondary | ICD-10-CM | POA: Insufficient documentation

## 2012-11-22 DIAGNOSIS — Z765 Malingerer [conscious simulation]: Secondary | ICD-10-CM | POA: Insufficient documentation

## 2012-11-22 DIAGNOSIS — Z87891 Personal history of nicotine dependence: Secondary | ICD-10-CM | POA: Insufficient documentation

## 2012-11-22 DIAGNOSIS — Z862 Personal history of diseases of the blood and blood-forming organs and certain disorders involving the immune mechanism: Secondary | ICD-10-CM | POA: Insufficient documentation

## 2012-11-22 DIAGNOSIS — R197 Diarrhea, unspecified: Secondary | ICD-10-CM | POA: Insufficient documentation

## 2012-11-22 DIAGNOSIS — G8929 Other chronic pain: Secondary | ICD-10-CM | POA: Insufficient documentation

## 2012-11-22 DIAGNOSIS — Z8742 Personal history of other diseases of the female genital tract: Secondary | ICD-10-CM | POA: Insufficient documentation

## 2012-11-22 NOTE — ED Notes (Addendum)
PT. REPORTS CHRONIC GENERALIZED ABDOMINAL PAIN WITH EMESIS AND DIARRHEA FOR SEVERAL YEARS SEEN HERE 2 DAYS AGO WITH NO IMPROVEMENT. DENIES FEVER OR CHILLS . ALERT AND ORIENTED . AMBULATORY.

## 2012-11-23 ENCOUNTER — Encounter (HOSPITAL_COMMUNITY): Payer: Self-pay | Admitting: Emergency Medicine

## 2012-11-23 ENCOUNTER — Emergency Department (HOSPITAL_COMMUNITY)
Admission: EM | Admit: 2012-11-23 | Discharge: 2012-11-23 | Disposition: A | Discharge status: Home / Self Care | Payer: Medicaid Other | Attending: Emergency Medicine | Admitting: Emergency Medicine

## 2012-11-23 ENCOUNTER — Emergency Department (HOSPITAL_COMMUNITY)
Admission: EM | Admit: 2012-11-23 | Discharge: 2012-11-23 | Discharge status: Against Medical Advice | Payer: Medicaid Other | Attending: Emergency Medicine | Admitting: Emergency Medicine

## 2012-11-23 DIAGNOSIS — Z765 Malingerer [conscious simulation]: Secondary | ICD-10-CM

## 2012-11-23 DIAGNOSIS — Z8742 Personal history of other diseases of the female genital tract: Secondary | ICD-10-CM | POA: Insufficient documentation

## 2012-11-23 DIAGNOSIS — Z9089 Acquired absence of other organs: Secondary | ICD-10-CM | POA: Insufficient documentation

## 2012-11-23 DIAGNOSIS — Z9889 Other specified postprocedural states: Secondary | ICD-10-CM | POA: Insufficient documentation

## 2012-11-23 DIAGNOSIS — Z87891 Personal history of nicotine dependence: Secondary | ICD-10-CM | POA: Insufficient documentation

## 2012-11-23 DIAGNOSIS — Z862 Personal history of diseases of the blood and blood-forming organs and certain disorders involving the immune mechanism: Secondary | ICD-10-CM | POA: Insufficient documentation

## 2012-11-23 DIAGNOSIS — Z3202 Encounter for pregnancy test, result negative: Secondary | ICD-10-CM | POA: Insufficient documentation

## 2012-11-23 DIAGNOSIS — G8929 Other chronic pain: Secondary | ICD-10-CM | POA: Insufficient documentation

## 2012-11-23 DIAGNOSIS — K861 Other chronic pancreatitis: Secondary | ICD-10-CM | POA: Insufficient documentation

## 2012-11-23 LAB — URINALYSIS, ROUTINE W REFLEX MICROSCOPIC
Glucose, UA: NEGATIVE mg/dL
Hgb urine dipstick: NEGATIVE
Specific Gravity, Urine: 1.024 (ref 1.005–1.030)
pH: 7 (ref 5.0–8.0)

## 2012-11-23 LAB — CBC WITH DIFFERENTIAL/PLATELET
Basophils Absolute: 0 10*3/uL (ref 0.0–0.1)
Basophils Relative: 1 % (ref 0–1)
HCT: 42.4 % (ref 36.0–46.0)
Hemoglobin: 14.3 g/dL (ref 12.0–15.0)
Lymphs Abs: 2.6 10*3/uL (ref 0.7–4.0)
MCH: 27.4 pg (ref 26.0–34.0)
MCHC: 33.7 g/dL (ref 30.0–36.0)
Monocytes Absolute: 0.6 10*3/uL (ref 0.1–1.0)
Monocytes Relative: 8 % (ref 3–12)
Neutro Abs: 4.2 10*3/uL (ref 1.7–7.7)
Neutrophils Relative %: 54 % (ref 43–77)
RBC: 5.22 MIL/uL — ABNORMAL HIGH (ref 3.87–5.11)

## 2012-11-23 LAB — COMPREHENSIVE METABOLIC PANEL
ALT: 88 U/L — ABNORMAL HIGH (ref 0–35)
AST: 47 U/L — ABNORMAL HIGH (ref 0–37)
Albumin: 4.3 g/dL (ref 3.5–5.2)
CO2: 27 mEq/L (ref 19–32)
Calcium: 9.7 mg/dL (ref 8.4–10.5)
Sodium: 139 mEq/L (ref 135–145)
Total Protein: 8.9 g/dL — ABNORMAL HIGH (ref 6.0–8.3)

## 2012-11-23 LAB — URINE MICROSCOPIC-ADD ON

## 2012-11-23 LAB — POCT PREGNANCY, URINE: Preg Test, Ur: NEGATIVE

## 2012-11-23 MED ORDER — PROMETHAZINE HCL 25 MG/ML IJ SOLN
25.0000 mg | Freq: Once | INTRAMUSCULAR | Status: DC
  Filled 2012-11-23: qty 1

## 2012-11-23 MED ORDER — HYDROMORPHONE HCL PF 1 MG/ML IJ SOLN
0.5000 mg | Freq: Once | INTRAMUSCULAR | Status: AC
  Administered 2012-11-23: 0.5 mg via INTRAVENOUS
  Filled 2012-11-23: qty 1

## 2012-11-23 MED ORDER — OXYCODONE HCL 5 MG PO TABS: 10.0000 mg | ORAL_TABLET | Freq: Once | ORAL | Status: DC

## 2012-11-23 MED ORDER — DIPHENHYDRAMINE HCL 50 MG/ML IJ SOLN
25.0000 mg | Freq: Once | INTRAMUSCULAR | Status: AC
  Administered 2012-11-23: 25 mg via INTRAVENOUS
  Filled 2012-11-23: qty 1

## 2012-11-23 MED ORDER — PROMETHAZINE HCL 25 MG RE SUPP
25.0000 mg | Freq: Once | RECTAL | Status: DC
  Filled 2012-11-23: qty 1

## 2012-11-23 MED ORDER — PROMETHAZINE HCL 25 MG/ML IJ SOLN
25.0000 mg | Freq: Four times a day (QID) | INTRAMUSCULAR | Status: DC | PRN
  Filled 2012-11-23: qty 1

## 2012-11-23 MED ORDER — SODIUM CHLORIDE 0.9 % IV BOLUS (SEPSIS)
500.0000 mL | Freq: Once | INTRAVENOUS | Status: AC
  Administered 2012-11-23: 500 mL via INTRAVENOUS

## 2012-11-23 MED ORDER — METOCLOPRAMIDE HCL 5 MG/ML IJ SOLN
10.0000 mg | Freq: Once | INTRAMUSCULAR | Status: AC
  Administered 2012-11-23: 10 mg via INTRAVENOUS
  Filled 2012-11-23: qty 2

## 2012-11-23 NOTE — ED Provider Notes (Signed)
History     CSN: [id number]  Arrival date & time 11/22/12  2142   First MD Initiated Contact with Patient 11/23/12 0200      Chief Complaint  Patient presents with  . Abdominal Pain    (Consider location/radiation/quality/duration/timing/severity/associated sxs/prior treatment) HPI History provided by pt.   Pt presents w/ epigastric pain x 7 days that worsened 2 days ago.  Pain is typical of her chronic pancreatitis.  Radiates into her chest and to her low back.  Associated w/ N/V/D.  Denies fever, cough, SOB, hematemesis/hematochezia/melena, urinary and vaginal sx.  Per prior chart, pt has been seen in the ED 45 times in the past 6 months.  She has been seen at [facility] as well.  Most recent visit 11/20/12 for same sx.  Pt reports that her general surgeon saw her yesterday, plans to obtain prior records and see her back next month.  He supposedely could not understand why she was not admitted to the hospital.    Past Medical History  Diagnosis Date  . Pancreatitis   . S/P laparoscopic cholecystectomy   . Fibroids   . Ovarian cyst   . Chronic abdominal pain   . Anemia     Past Surgical History  Procedure Laterality Date  . Cholecystectomy    . Dilation and curettage of uterus    . Ercp w/ metal stent placement      Family History  Problem Relation Age of Onset  . Diabetes Mother   . Hypertension Mother   . Hypertension Father   . Diabetes Father   . Asthma Daughter     History  Substance Use Topics  . Smoking status: Former Smoker -- 0.50 packs/day for 10 years    Types: Cigarettes    Quit date: 09/01/2012  . Smokeless tobacco: Never Used  . Alcohol Use: No    OB History   Grav Para Term Preterm Abortions TAB SAB Ect Mult Living   4 2 1 1 2  2          Review of Systems  All other systems reviewed and are negative.    Allergies  Celecoxib; Fentanyl; Ibuprofen; Ketorolac tromethamine; Meperidine hcl; Morphine; Ondansetron; Shellfish allergy;  Tramadol; Coconut flavor; Propoxyphene-acetaminophen; and Tylenol  Home Medications  No current outpatient prescriptions on file.  BP 127/83  Pulse 98  Temp(Src) 98.9 F (37.2 C) (Oral)  Resp 14  SpO2 100%  LMP 08/19/2012  Physical Exam  Nursing note and vitals reviewed. Constitutional: She is oriented to person, place, and time. She appears well-developed and well-nourished. No distress.  HENT:  Head: Normocephalic and atraumatic.  Eyes:  Normal appearance  Neck: Normal range of motion.  Cardiovascular: Normal rate and regular rhythm.   Pulmonary/Chest: Effort normal and breath sounds normal. No respiratory distress.  Abdominal: Soft. Bowel sounds are normal. She exhibits no distension and no mass. There is no rebound.  Epgiastric and LUQ ttp w/ guarding   Genitourinary:  No CVA tenderness  Musculoskeletal: Normal range of motion.  Neurological: She is alert and oriented to person, place, and time.  Skin: Skin is warm and dry. No rash noted.  Psychiatric: She has a normal mood and affect. Her behavior is normal.    ED Course  Procedures (including critical care time)  Labs Reviewed  CBC WITH DIFFERENTIAL  COMPREHENSIVE METABOLIC PANEL  LIPASE, BLOOD   No results found.   1. Drug-seeking behavior       MDM  31yo F presents  w/ acute on chronic epigastric pain + N/V/D that is typical of her pancreatitis.  Most recent visit for same 2 days ago.  Was evaluated by her new general surgeon, Dr. Carolynne Edouard, yesterday, he was apparently surprised that she had not been admitted to the hospital on the 19th, though the plan is to see her back next month after obtaining records.   Afebrile, NAD, well-hydrated, abd soft/non-tender, epigastric and LUQ ttp on exam.  Pelvic exam performed 2 days ago.  Labs pending.  Pt offered roxicodone and IM phenergan.  She tells nursing staff that she can not take injections and is not tolerating pos so she will need an IV.  She is allergic to all  parenteral pain medications accept dilaudid.  Will give her a dose of PR phenergan and then treat pain w/ the roxi when she is tolerating pos.  2:33 AM    Pt refused PR phenergan and requested a new physician.  I explained very politely that there was no other provider available to see her and that she did not have to take the medications offered but there would not be anything else offered.  She was calm but argumentative.  I told her that her behavior was very suspicious.  She asked to speak to the charge nurse and agreed to take IM phenergan at that time.  Eloped prior to receiving any medications and told nursing staff that she would go elsewhere.  3:33 AM     Otilio Miu, PA-C 11/23/12 [phone]

## 2012-11-23 NOTE — ED Notes (Signed)
Pt left because she didn't like the Dr.'s plan

## 2012-11-23 NOTE — ED Notes (Signed)
Pt  Wanting to see another doctor.She is refusing to take her current medications.Pt talking rudely to PA and refusing her treatment as she prefers IV pain medications.

## 2012-11-23 NOTE — ED Provider Notes (Signed)
Medical screening examination/treatment/procedure(s) were performed by non-physician practitioner and as supervising physician I was immediately available for consultation/collaboration.  Jasmine Awe, MD 11/23/12 [phone]

## 2012-11-23 NOTE — ED Notes (Signed)
Pt states she is unable to void at this time. Will notify PA.

## 2012-11-23 NOTE — ED Notes (Signed)
Pt states she is unable to void at this time.

## 2012-11-23 NOTE — ED Provider Notes (Signed)
History     CSN: [id number]  Arrival date & time 11/23/12  1029   First MD Initiated Contact with Patient 11/23/12 1108      Chief Complaint  Patient presents with  . Abdominal Pain    (Consider location/radiation/quality/duration/timing/severity/associated sxs/prior treatment) HPI Comments: 32 y.o. Female with PMHx of chronic pancreatitis presents today with sx similar to that of previous pancreatic pain that has not subsided in 7 days, gradually worsening over the last few days. She describes 10/10 epigastric pain radiating to her back, nausea. Pt has been seen almost 50 times in the ER with the same sx and as recently as yesterday. Pt also states she was at her PCP earlier today who sent her to the ED. Denies fever, vomiting, dysuria, hematuria, hematochezia, cough, SOB, chest pain.    Patient is a 32 y.o. female presenting with abdominal pain.  Abdominal Pain Associated symptoms include abdominal pain. Pertinent negatives include no chest pain, diaphoresis, fever, headaches, nausea, neck pain, numbness, rash, vomiting or weakness.    Past Medical History  Diagnosis Date  . Pancreatitis   . S/P laparoscopic cholecystectomy   . Fibroids   . Ovarian cyst   . Chronic abdominal pain   . Anemia     Past Surgical History  Procedure Laterality Date  . Cholecystectomy    . Dilation and curettage of uterus    . Ercp w/ metal stent placement      Family History  Problem Relation Age of Onset  . Diabetes Mother   . Hypertension Mother   . Hypertension Father   . Diabetes Father   . Asthma Daughter     History  Substance Use Topics  . Smoking status: Former Smoker -- 0.50 packs/day for 10 years    Types: Cigarettes    Quit date: 09/01/2012  . Smokeless tobacco: Never Used  . Alcohol Use: No    OB History   Grav Para Term Preterm Abortions TAB SAB Ect Mult Living   4 2 1 1 2  2          Review of Systems  Constitutional: Negative for fever and diaphoresis.   HENT: Negative for neck pain and neck stiffness.   Eyes: Negative for visual disturbance.  Respiratory: Negative for apnea, chest tightness and shortness of breath.   Cardiovascular: Negative for chest pain and palpitations.  Gastrointestinal: Positive for abdominal pain. Negative for nausea, vomiting, diarrhea and constipation.       Epigastric radiating  Genitourinary: Negative for dysuria.  Musculoskeletal: Negative for gait problem.  Skin: Negative for rash.  Neurological: Negative for dizziness, weakness, light-headedness, numbness and headaches.    Allergies  Celecoxib; Fentanyl; Ibuprofen; Ketorolac tromethamine; Meperidine hcl; Morphine; Ondansetron; Shellfish allergy; Tramadol; Coconut flavor; Propoxyphene-acetaminophen; and Tylenol  Home Medications  No current outpatient prescriptions on file.  BP 127/97  Pulse 82  Temp(Src) 98.3 F (36.8 C) (Oral)  Resp 16  SpO2 100%  LMP 08/19/2012  Physical Exam  Nursing note and vitals reviewed. Constitutional: She is oriented to person, place, and time. She appears well-developed and well-nourished. No distress.  HENT:  Head: Normocephalic and atraumatic.  Eyes: Conjunctivae and EOM are normal.  Neck: Normal range of motion. Neck supple.  No meningeal signs  Cardiovascular: Normal rate, regular rhythm and normal heart sounds.  Exam reveals no gallop and no friction rub.   No murmur heard. Pulmonary/Chest: Effort normal and breath sounds normal. No respiratory distress. She has no wheezes. She has no rales. She  exhibits no tenderness.  Abdominal: Soft. Bowel sounds are normal. She exhibits no distension. There is no tenderness. There is no rebound and no guarding.  No pain at McBurney's point, no Rovsing's sign  Musculoskeletal: Normal range of motion. She exhibits no edema and no tenderness.  Neurological: She is alert and oriented to person, place, and time. No cranial nerve deficit.  Skin: Skin is warm and dry. She is not  diaphoretic. No erythema.    ED Course  Procedures (including critical care time)  Labs Reviewed  CBC WITH DIFFERENTIAL - Abnormal; Notable for the following:    RBC 5.22 (*)    All other components within normal limits  COMPREHENSIVE METABOLIC PANEL - Abnormal; Notable for the following:    Potassium 5.2 (*)    Total Protein 8.9 (*)    AST 47 (*)    ALT 88 (*)    All other components within normal limits  URINALYSIS, ROUTINE W REFLEX MICROSCOPIC - Abnormal; Notable for the following:    Protein, ur 30 (*)    All other components within normal limits  URINE MICROSCOPIC-ADD ON - Abnormal; Notable for the following:    Squamous Epithelial / LPF MANY (*)    Casts GRANULAR CAST (*)    All other components within normal limits  LIPASE, BLOOD  POCT PREGNANCY, URINE   No results found. Medications  metoCLOPramide (REGLAN) injection 10 mg (10 mg Intravenous Given 11/23/12 1231)  sodium chloride 0.9 % bolus 500 mL (0 mLs Intravenous Stopped 11/23/12 1427)  HYDROmorphone (DILAUDID) injection 0.5 mg (0.5 mg Intravenous Given 11/23/12 1230)  diphenhydrAMINE (BENADRYL) injection 25 mg (25 mg Intravenous Given 11/23/12 1230)    Filed Vitals:   11/23/12 1315 11/23/12 1330 11/23/12 1400 11/23/12 1427  BP: 114/70 141/110 102/63 112/71  Pulse: 70 76 81 89  Temp:    98.2 F (36.8 C)  TempSrc:    Oral  Resp:    16  SpO2: 100% 99% 100% 100%     1. PANCREATITIS, CHRONIC       MDM  Pt is afebrile, in NAD, and appears very comfortable texting on her phone and sitting up in bed in a conversational manner. Discussed pt hx and pt plan with Dr. Silverio Lay ahead of time who agrees to labs and re-evaluate. Pt asking for pain meds. Will manage pain here, but will not prescribe any pain meds for pt as she has a doctor that she sees and also, per the pt, has been to the chronic pain mgmt clinic.   Labs were unconcerning and show pt at her baseline.  At this time there does not appear to be any evidence of  an acute emergency medical condition and the patient appears stable for discharge with appropriate outpatient follow up.Diagnosis was discussed with patient who verbalizes understanding and is agreeable to discharge. Pt case discussed with Dr. Silverio Lay  who agrees with plan.    Glade Nurse, PA-C 11/24/12 1549  Glade Nurse, PA-C 11/24/12 1549

## 2012-11-23 NOTE — ED Notes (Signed)
Pt still sts that she would like another doctor b/c she she would like to have her pain med in shot form.  Pt sts she wants to leave now b/c we want to kill her.  Pt would not sign ama form. Pt ambulatory to exit without difficulty.

## 2012-11-23 NOTE — ED Notes (Signed)
Pt sts unable to void. x1 attempt

## 2012-11-23 NOTE — ED Notes (Signed)
Pt requesting a female doctor.

## 2012-11-23 NOTE — ED Notes (Signed)
Pt requesting blood to be drawn when she gets and IV

## 2012-11-23 NOTE — ED Notes (Signed)
Pt reports having generalized abdominal pain x 1 week. Pt seen here yesterday, went to her Dr today and was told to come back to ED. Pt reports N/V/D.

## 2012-11-29 ENCOUNTER — Encounter (HOSPITAL_COMMUNITY): Payer: Self-pay | Admitting: Emergency Medicine

## 2012-11-29 ENCOUNTER — Emergency Department (HOSPITAL_COMMUNITY)
Admission: EM | Admit: 2012-11-29 | Discharge: 2012-11-29 | Disposition: A | Discharge status: Home / Self Care | Payer: Medicaid Other | Attending: Emergency Medicine | Admitting: Emergency Medicine

## 2012-11-29 DIAGNOSIS — Z8719 Personal history of other diseases of the digestive system: Secondary | ICD-10-CM | POA: Insufficient documentation

## 2012-11-29 DIAGNOSIS — Z8742 Personal history of other diseases of the female genital tract: Secondary | ICD-10-CM | POA: Insufficient documentation

## 2012-11-29 DIAGNOSIS — R109 Unspecified abdominal pain: Secondary | ICD-10-CM | POA: Insufficient documentation

## 2012-11-29 DIAGNOSIS — Z862 Personal history of diseases of the blood and blood-forming organs and certain disorders involving the immune mechanism: Secondary | ICD-10-CM | POA: Insufficient documentation

## 2012-11-29 DIAGNOSIS — H6001 Abscess of right external ear: Secondary | ICD-10-CM

## 2012-11-29 DIAGNOSIS — H60399 Other infective otitis externa, unspecified ear: Secondary | ICD-10-CM | POA: Insufficient documentation

## 2012-11-29 DIAGNOSIS — Z87891 Personal history of nicotine dependence: Secondary | ICD-10-CM | POA: Insufficient documentation

## 2012-11-29 DIAGNOSIS — G8929 Other chronic pain: Secondary | ICD-10-CM | POA: Insufficient documentation

## 2012-11-29 MED ORDER — HYDROCODONE-ACETAMINOPHEN 5-325 MG PO TABS: 1.0000 | ORAL_TABLET | Freq: Four times a day (QID) | ORAL | Status: DC | PRN

## 2012-11-29 MED ORDER — CLINDAMYCIN HCL 150 MG PO CAPS: 300.0000 mg | ORAL_CAPSULE | Freq: Three times a day (TID) | ORAL | Status: DC

## 2012-11-29 MED ORDER — HYDROCODONE-ACETAMINOPHEN 5-325 MG PO TABS
1.0000 | ORAL_TABLET | Freq: Once | ORAL | Status: AC
  Administered 2012-11-29: 1 via ORAL
  Filled 2012-11-29: qty 1

## 2012-11-29 NOTE — ED Provider Notes (Signed)
Medical screening examination/treatment/procedure(s) were performed by non-physician practitioner and as supervising physician I was immediately available for consultation/collaboration.   Richardean Canal, MD 11/29/12 [phone]

## 2012-11-29 NOTE — ED Notes (Signed)
PA at bedside for procedure.

## 2012-11-29 NOTE — ED Provider Notes (Signed)
History     CSN: [id number]  Arrival date & time 11/29/12  1148   First MD Initiated Contact with Patient 11/29/12 1222      Chief Complaint  Patient presents with  . Otalgia    (Consider location/radiation/quality/duration/timing/severity/associated sxs/prior treatment) HPI [name] is a 32 y.o. female who presents to ED with complaint of right ear pain and drainage. States started several days ago. States no injury. Hx of the same, was seen by ENT, told that she had an abscess that was drained. Since then recurrent swelling and pain. Denies fever, chills. Denies URI symptoms. No other complaints. Tried warm compresses with no relief.    Past Medical History  Diagnosis Date  . Pancreatitis   . S/P laparoscopic cholecystectomy   . Fibroids   . Ovarian cyst   . Chronic abdominal pain   . Anemia     Past Surgical History  Procedure Laterality Date  . Cholecystectomy    . Dilation and curettage of uterus    . Ercp w/ metal stent placement      Family History  Problem Relation Age of Onset  . Diabetes Mother   . Hypertension Mother   . Hypertension Father   . Diabetes Father   . Asthma Daughter     History  Substance Use Topics  . Smoking status: Former Smoker -- 0.50 packs/day for 10 years    Types: Cigarettes    Quit date: 09/01/2012  . Smokeless tobacco: Never Used  . Alcohol Use: No    OB History   Grav Para Term Preterm Abortions TAB SAB Ect Mult Living   4 2 1 1 2  2          Review of Systems  Constitutional: Negative for fever and chills.  HENT: Positive for ear pain and ear discharge.   Skin: Positive for wound.    Allergies  Celecoxib; Fentanyl; Ibuprofen; Ketorolac tromethamine; Meperidine hcl; Morphine; Ondansetron; Shellfish allergy; Tramadol; Coconut flavor; Propoxyphene-acetaminophen; and Tylenol  Home Medications   Current Outpatient Rx  Name  Route  Sig  Dispense  Refill  . clindamycin (CLEOCIN) 150 MG capsule   Oral   Take  2 capsules (300 mg total) by mouth 3 (three) times daily.   42 capsule   0   . HYDROcodone-acetaminophen (NORCO) 5-325 MG per tablet   Oral   Take 1 tablet by mouth every 6 (six) hours as needed for pain.   10 tablet   0     BP 122/82  Pulse 74  Temp(Src) 98.2 F (36.8 C) (Oral)  Resp 18  SpO2 100%  LMP 08/19/2012  Physical Exam  Nursing note and vitals reviewed. Constitutional: She appears well-developed and well-nourished. No distress.  HENT:  Head: Normocephalic.  2cm abscess to the distal right ear canal. Purulent drainage noted. Tender to palpation. TMs normal bilaterally  Eyes: Conjunctivae are normal.  Neck: Neck supple.  Lymphadenopathy:    She has no cervical adenopathy.  Skin: Skin is warm and dry.    ED Course  Procedures (including critical care time)  INCISION AND DRAINAGE Performed by: Jaynie Crumble A Consent: Verbal consent obtained. Risks and benefits: risks, benefits and alternatives were discussed Type: abscess  Body area: right ear canal  Anesthesia: local infiltration  Incision was made with a scalpel.  Local anesthetic: lidocaine 2% w epinephrine  Anesthetic total: 1 ml  Complexity: complex Blunt dissection to break up loculations  Drainage: purulent  Drainage amount: small  Packing  material: no packing  Patient tolerance: Patient tolerated the procedure well with no immediate complications.     1. Abscess of right ear canal       MDM  Right ear canal abscess. I&Ded. This is a recurrent problem, will refer to ENT, suspect possible cyst, may need surgical exision. Will start on antibiotic, warm compresses at home. Follow up with ENT.  Filed Vitals:   11/29/12 1151 11/29/12 1301  BP: 133/77 122/82  Pulse: 82 74  Temp: 98.2 F (36.8 C)   TempSrc: Oral   Resp: 16 18  SpO2: 97% 100%          Garv Kuechle A Barnell Shieh, PA-C 11/29/12 1337

## 2012-11-29 NOTE — ED Notes (Signed)
Rt ear pain x several days now draining

## 2012-12-01 NOTE — ED Provider Notes (Signed)
Medical screening examination/treatment/procedure(s) were performed by non-physician practitioner and as supervising physician I was immediately available for consultation/collaboration.   David H Yao, MD 12/01/12 1531 

## 2012-12-03 ENCOUNTER — Emergency Department (HOSPITAL_COMMUNITY): Payer: Medicaid Other

## 2012-12-03 ENCOUNTER — Observation Stay (HOSPITAL_COMMUNITY)
Admission: EM | Admit: 2012-12-03 | Discharge: 2012-12-06 | Disposition: A | Discharge status: Home / Self Care | Payer: Medicaid Other | Attending: Internal Medicine | Admitting: Internal Medicine

## 2012-12-03 ENCOUNTER — Encounter (HOSPITAL_COMMUNITY): Payer: Self-pay | Admitting: Nurse Practitioner

## 2012-12-03 ENCOUNTER — Encounter (HOSPITAL_COMMUNITY): Payer: Self-pay

## 2012-12-03 ENCOUNTER — Emergency Department (HOSPITAL_COMMUNITY)
Admission: EM | Admit: 2012-12-03 | Discharge: 2012-12-03 | Disposition: A | Discharge status: Home / Self Care | Payer: Medicaid Other | Attending: Emergency Medicine | Admitting: Emergency Medicine

## 2012-12-03 DIAGNOSIS — R109 Unspecified abdominal pain: Secondary | ICD-10-CM

## 2012-12-03 DIAGNOSIS — F6812 Factitious disorder with predominantly physical signs and symptoms: Secondary | ICD-10-CM

## 2012-12-03 DIAGNOSIS — Z862 Personal history of diseases of the blood and blood-forming organs and certain disorders involving the immune mechanism: Secondary | ICD-10-CM | POA: Insufficient documentation

## 2012-12-03 DIAGNOSIS — R112 Nausea with vomiting, unspecified: Secondary | ICD-10-CM | POA: Insufficient documentation

## 2012-12-03 DIAGNOSIS — Z87891 Personal history of nicotine dependence: Secondary | ICD-10-CM | POA: Insufficient documentation

## 2012-12-03 DIAGNOSIS — Z9089 Acquired absence of other organs: Secondary | ICD-10-CM | POA: Insufficient documentation

## 2012-12-03 DIAGNOSIS — Z87448 Personal history of other diseases of urinary system: Secondary | ICD-10-CM

## 2012-12-03 DIAGNOSIS — Z8742 Personal history of other diseases of the female genital tract: Secondary | ICD-10-CM | POA: Insufficient documentation

## 2012-12-03 DIAGNOSIS — F339 Major depressive disorder, recurrent, unspecified: Secondary | ICD-10-CM

## 2012-12-03 DIAGNOSIS — G8929 Other chronic pain: Secondary | ICD-10-CM | POA: Insufficient documentation

## 2012-12-03 DIAGNOSIS — R197 Diarrhea, unspecified: Secondary | ICD-10-CM

## 2012-12-03 DIAGNOSIS — Z8719 Personal history of other diseases of the digestive system: Secondary | ICD-10-CM | POA: Insufficient documentation

## 2012-12-03 DIAGNOSIS — R000 Tachycardia, unspecified: Secondary | ICD-10-CM | POA: Insufficient documentation

## 2012-12-03 DIAGNOSIS — K861 Other chronic pancreatitis: Secondary | ICD-10-CM

## 2012-12-03 DIAGNOSIS — F39 Unspecified mood [affective] disorder: Secondary | ICD-10-CM | POA: Insufficient documentation

## 2012-12-03 DIAGNOSIS — Z9189 Other specified personal risk factors, not elsewhere classified: Secondary | ICD-10-CM

## 2012-12-03 DIAGNOSIS — K219 Gastro-esophageal reflux disease without esophagitis: Secondary | ICD-10-CM | POA: Diagnosis present

## 2012-12-03 DIAGNOSIS — R1115 Cyclical vomiting syndrome unrelated to migraine: Secondary | ICD-10-CM | POA: Insufficient documentation

## 2012-12-03 DIAGNOSIS — R7401 Elevation of levels of liver transaminase levels: Secondary | ICD-10-CM

## 2012-12-03 DIAGNOSIS — F17200 Nicotine dependence, unspecified, uncomplicated: Secondary | ICD-10-CM | POA: Insufficient documentation

## 2012-12-03 DIAGNOSIS — R7402 Elevation of levels of lactic acid dehydrogenase (LDH): Secondary | ICD-10-CM | POA: Insufficient documentation

## 2012-12-03 DIAGNOSIS — R7989 Other specified abnormal findings of blood chemistry: Secondary | ICD-10-CM | POA: Insufficient documentation

## 2012-12-03 DIAGNOSIS — K7689 Other specified diseases of liver: Secondary | ICD-10-CM | POA: Insufficient documentation

## 2012-12-03 LAB — COMPREHENSIVE METABOLIC PANEL
ALT: 395 U/L — ABNORMAL HIGH (ref 0–35)
AST: 661 U/L — ABNORMAL HIGH (ref 0–37)
Albumin: 4 g/dL (ref 3.5–5.2)
Albumin: 3.7 g/dL (ref 3.5–5.2)
Albumin: 3.6 g/dL (ref 3.5–5.2)
Alkaline Phosphatase: 107 U/L (ref 39–117)
Alkaline Phosphatase: 101 U/L (ref 39–117)
Alkaline Phosphatase: 100 U/L (ref 39–117)
BUN: 17 mg/dL (ref 6–23)
BUN: 14 mg/dL (ref 6–23)
CO2: 23 mEq/L (ref 19–32)
Calcium: 8.6 mg/dL (ref 8.4–10.5)
Chloride: 103 mEq/L (ref 96–112)
Chloride: 108 mEq/L (ref 96–112)
Creatinine, Ser: 0.69 mg/dL (ref 0.50–1.10)
Creatinine, Ser: 0.65 mg/dL (ref 0.50–1.10)
GFR calc Af Amer: >90 mL/min (ref >90)
GFR calc non Af Amer: >90 mL/min (ref >90)
GFR calc non Af Amer: >90 mL/min (ref >90)
Glucose, Bld: 100 mg/dL — ABNORMAL HIGH (ref 70–99)
Glucose, Bld: 97 mg/dL (ref 70–99)
Glucose, Bld: 92 mg/dL (ref 70–99)
Potassium: 4 mEq/L (ref 3.5–5.1)
Potassium: 4.1 mEq/L (ref 3.5–5.1)
Sodium: 139 mEq/L (ref 135–145)
Total Bilirubin: 0.5 mg/dL (ref 0.3–1.2)
Total Bilirubin: 0.4 mg/dL (ref 0.3–1.2)
Total Protein: 7.6 g/dL (ref 6.0–8.3)

## 2012-12-03 LAB — CBC WITH DIFFERENTIAL/PLATELET
Basophils Absolute: 0 10*3/uL (ref 0.0–0.1)
Basophils Relative: 0 % (ref 0–1)
HCT: 35.3 % — ABNORMAL LOW (ref 36.0–46.0)
Hemoglobin: 12.3 g/dL (ref 12.0–15.0)
Lymphs Abs: 2.7 10*3/uL (ref 0.7–4.0)
MCHC: 34.8 g/dL (ref 30.0–36.0)
MCV: 80.2 fL (ref 78.0–100.0)
Monocytes Relative: 11 % (ref 3–12)
Neutro Abs: 3 10*3/uL (ref 1.7–7.7)
RDW: 12.3 % (ref 11.5–15.5)

## 2012-12-03 LAB — LIPASE, BLOOD: Lipase: 37 U/L (ref 11–59)

## 2012-12-03 MED ORDER — PROMETHAZINE HCL 25 MG/ML IJ SOLN
25.0000 mg | Freq: Once | INTRAMUSCULAR | Status: AC
  Administered 2012-12-03: 25 mg via INTRAVENOUS
  Filled 2012-12-03: qty 1

## 2012-12-03 MED ORDER — ENOXAPARIN SODIUM 40 MG/0.4ML ~~LOC~~ SOLN
40.0000 mg | SUBCUTANEOUS | Status: DC
  Filled 2012-12-03 (×6): qty 0.4

## 2012-12-03 MED ORDER — DIPHENHYDRAMINE HCL 50 MG/ML IJ SOLN
25.0000 mg | Freq: Once | INTRAMUSCULAR | Status: AC
  Administered 2012-12-03: 25 mg via INTRAVENOUS
  Filled 2012-12-03: qty 1

## 2012-12-03 MED ORDER — HYDROMORPHONE HCL PF 1 MG/ML IJ SOLN
0.5000 mg | INTRAMUSCULAR | Status: DC | PRN
  Administered 2012-12-03 – 2012-12-06 (×17): 0.5 mg via INTRAVENOUS
  Filled 2012-12-03 (×17): qty 1

## 2012-12-03 MED ORDER — PROMETHAZINE HCL 25 MG/ML IJ SOLN
12.5000 mg | Freq: Once | INTRAMUSCULAR | Status: DC
Start: 2012-12-03 — End: 2012-12-03
  Filled 2012-12-03: qty 1

## 2012-12-03 MED ORDER — ALBUTEROL SULFATE HFA 108 (90 BASE) MCG/ACT IN AERS
2.0000 | INHALATION_SPRAY | Freq: Four times a day (QID) | RESPIRATORY_TRACT | Status: DC | PRN
  Filled 2012-12-03: qty 6.7

## 2012-12-03 MED ORDER — HYDROMORPHONE HCL PF 1 MG/ML IJ SOLN
1.0000 mg | Freq: Once | INTRAMUSCULAR | Status: AC
Start: 2012-12-03 — End: 2012-12-03
  Administered 2012-12-03: 1 mg via INTRAVENOUS
  Filled 2012-12-03: qty 1

## 2012-12-03 MED ORDER — SODIUM CHLORIDE 0.9 % IV SOLN
INTRAVENOUS | Status: AC
  Administered 2012-12-03 – 2012-12-04 (×3): via INTRAVENOUS

## 2012-12-03 MED ORDER — HYDROMORPHONE HCL PF 1 MG/ML IJ SOLN
1.0000 mg | Freq: Once | INTRAMUSCULAR | Status: AC
  Administered 2012-12-03: 1 mg via INTRAVENOUS
  Filled 2012-12-03: qty 1

## 2012-12-03 MED ORDER — SODIUM CHLORIDE 0.9 % IV SOLN
INTRAVENOUS | Status: DC
  Administered 2012-12-03: 17:00:00 via INTRAVENOUS

## 2012-12-03 MED ORDER — SODIUM CHLORIDE 0.9 % IV SOLN
25.0000 mg | Freq: Four times a day (QID) | INTRAVENOUS | Status: DC | PRN
  Administered 2012-12-03 – 2012-12-06 (×11): 25 mg via INTRAVENOUS
  Filled 2012-12-03 (×14): qty 0.5

## 2012-12-03 MED ORDER — SODIUM CHLORIDE 0.9 % IV BOLUS (SEPSIS)
500.0000 mL | Freq: Once | INTRAVENOUS | Status: AC
  Administered 2012-12-03: 500 mL via INTRAVENOUS

## 2012-12-03 MED ORDER — DIPHENHYDRAMINE HCL 25 MG PO CAPS
25.0000 mg | ORAL_CAPSULE | Freq: Four times a day (QID) | ORAL | Status: DC | PRN
  Filled 2012-12-03: qty 1

## 2012-12-03 MED ORDER — PROMETHAZINE HCL 25 MG PO TABS: 12.5000 mg | ORAL_TABLET | Freq: Four times a day (QID) | ORAL | Status: DC | PRN

## 2012-12-03 NOTE — ED Notes (Signed)
States she was here last night for abd pain but had to leave due to childcare needs. States she was told to come back for admission due to elevated liver enzymes on labs lastnight. States abd pain continues and is unable to tolerate oral intake

## 2012-12-03 NOTE — ED Provider Notes (Signed)
History     CSN: [id number]  Arrival date & time 12/03/12  1507   First MD Initiated Contact with Patient 12/03/12 1545      Chief Complaint  Patient presents with  . Abdominal Pain    (Consider location/radiation/quality/duration/timing/severity/associated sxs/prior treatment) HPI  Past Medical History  Diagnosis Date  . Pancreatitis   . S/P laparoscopic cholecystectomy   . Fibroids   . Ovarian cyst   . Chronic abdominal pain   . Anemia     Past Surgical History  Procedure Laterality Date  . Cholecystectomy    . Dilation and curettage of uterus    . Ercp w/ metal stent placement      Family History  Problem Relation Age of Onset  . Diabetes Mother   . Hypertension Mother   . Hypertension Father   . Diabetes Father   . Asthma Daughter     History  Substance Use Topics  . Smoking status: Former Smoker -- 0.50 packs/day for 10 years    Types: Cigarettes    Quit date: 09/01/2012  . Smokeless tobacco: Never Used  . Alcohol Use: No    OB History   Grav Para Term Preterm Abortions TAB SAB Ect Mult Living   4 2 1 1 2  2          Review of Systems  Allergies  Celecoxib; Fentanyl; Ibuprofen; Ketorolac tromethamine; Meperidine hcl; Morphine; Ondansetron; Shellfish allergy; Tramadol; Coconut flavor; Propoxyphene-acetaminophen; and Tylenol  Home Medications   Current Outpatient Rx  Name  Route  Sig  Dispense  Refill  . albuterol (PROVENTIL HFA;VENTOLIN HFA) 108 (90 BASE) MCG/ACT inhaler   Inhalation   Inhale 2 puffs into the lungs every 6 (six) hours as needed for wheezing.         . clindamycin (CLEOCIN) 150 MG capsule   Oral   Take 2 capsules (300 mg total) by mouth 3 (three) times daily.   42 capsule   0   . HYDROcodone-acetaminophen (NORCO) 5-325 MG per tablet   Oral   Take 1 tablet by mouth every 6 (six) hours as needed for pain.   10 tablet   0     BP 135/88  Pulse 101  Temp(Src) 98.2 F (36.8 C) (Oral)  Resp 16  Ht 5\' 5"  (1.651 m)   Wt 170 lb (77.111 kg)  BMI 28.29 kg/m2  SpO2 97%  LMP 11/26/2012  Physical Exam  ED Course  Procedures (including critical care time)  Labs Reviewed  PROTIME-INR  ACETAMINOPHEN LEVEL   US Abdomen Complete  12/03/2012   *RADIOLOGY REPORT*  Clinical Data:  Abdominal pain, elevated LFTs, prior cholecystectomy  COMPLETE ABDOMINAL ULTRASOUND  Comparison:  CT abdomen pelvis dated 09/09/2012  Findings:  Gallbladder:  Surgically absent.  Common bile duct:  Measures 7 mm.  Liver:  No focal lesion identified.  Hyperechoic hepatic parenchyma, possibly reflecting mild hepatic steatosis.  Mild intrahepatic ductal prominence, unchanged from prior CT, likely postsurgical.  IVC:  Appears normal.  Pancreas:  Incompletely visualized but grossly unremarkable.  Spleen:  Measures 4.3 cm.  Right Kidney:  Measures 11.2 cm.  11 x 11 x 12 mm interpolar cyst. No hydronephrosis.  Left Kidney:  Measures 11.0 cm.  No mass or hydronephrosis  Abdominal aorta:  No aneurysm identified.  IMPRESSION: Possible mild hepatic steatosis.  Status post cholecystectomy.  Stable mild intrahepatic ductal prominence, likely postsurgical.  Common duct measures 7 mm, within normal limits for postsurgical appearance.   Original Report Authenticated  By: Charline Bills, M.D.   Dg Abd 2 Views  12/03/2012   *RADIOLOGY REPORT*  Clinical Data: Left abdominal pain  ABDOMEN - 2 VIEW  Comparison: 10/31/2012  Findings: No free air.  Vascular clips in the right upper abdomen. Small bowel decompressed.  Moderate fecal material in the proximal colon, decompressed distally.  No abnormal abdominal calcifications.  Regional bones unremarkable.  IMPRESSION:  Nonobstructive bowel gas pattern with moderate proximal colonic fecal material.   Original Report Authenticated By: D. Andria Rhein, MD     No diagnosis found.  Results for orders placed during the hospital encounter of 12/03/12  LIPASE, BLOOD      Result Value Range   Lipase 50  11 - 59 U/L  CBC  WITH DIFFERENTIAL      Result Value Range   WBC 6.6  4.0 - 10.5 K/uL   RBC 4.40  3.87 - 5.11 MIL/uL   Hemoglobin 12.3  12.0 - 15.0 g/dL   HCT 16.1 (*) 09.6 - 04.5 %   MCV 80.2  78.0 - 100.0 fL   MCH 28.0  26.0 - 34.0 pg   MCHC 34.8  30.0 - 36.0 g/dL   RDW 40.9  81.1 - 91.4 %   Platelets 197  150 - 400 K/uL   Neutrophils Relative % 45  43 - 77 %   Lymphocytes Relative 41  12 - 46 %   Monocytes Relative 11  3 - 12 %   Eosinophils Relative 3  0 - 5 %   Basophils Relative 0  0 - 1 %   Neutro Abs 3.0  1.7 - 7.7 K/uL   Lymphs Abs 2.7  0.7 - 4.0 K/uL   Monocytes Absolute 0.7  0.1 - 1.0 K/uL   Eosinophils Absolute 0.2  0.0 - 0.7 K/uL   Basophils Absolute 0.0  0.0 - 0.1 K/uL   Smear Review MORPHOLOGY UNREMARKABLE    COMPREHENSIVE METABOLIC PANEL      Result Value Range   Sodium 137  135 - 145 mEq/L   Potassium 3.6  3.5 - 5.1 mEq/L   Chloride 103  96 - 112 mEq/L   CO2 25  19 - 32 mEq/L   Glucose, Bld 100 (*) 70 - 99 mg/dL   BUN 17  6 - 23 mg/dL   Creatinine, Ser 7.82  0.50 - 1.10 mg/dL   Calcium 9.1  8.4 - 95.6 mg/dL   Total Protein 7.9  6.0 - 8.3 g/dL   Albumin 4.0  3.5 - 5.2 g/dL   AST 213 (*) 0 - 37 U/L   ALT 291 (*) 0 - 35 U/L   Alkaline Phosphatase 107  39 - 117 U/L   Total Bilirubin 0.5  0.3 - 1.2 mg/dL   GFR calc non Af Amer >90  >90 mL/min   GFR calc Af Amer >90  >90 mL/min  COMPREHENSIVE METABOLIC PANEL      Result Value Range   Sodium 139  135 - 145 mEq/L   Potassium 4.0  3.5 - 5.1 mEq/L   Chloride 108  96 - 112 mEq/L   CO2 21  19 - 32 mEq/L   Glucose, Bld 97  70 - 99 mg/dL   BUN 15  6 - 23 mg/dL   Creatinine, Ser 0.86  0.50 - 1.10 mg/dL   Calcium 8.6  8.4 - 57.8 mg/dL   Total Protein 7.6  6.0 - 8.3 g/dL   Albumin 3.7  3.5 - 5.2 g/dL  AST 661 (*) 0 - 37 U/L   ALT 395 (*) 0 - 35 U/L   Alkaline Phosphatase 101  39 - 117 U/L   Total Bilirubin 0.5  0.3 - 1.2 mg/dL   GFR calc non Af Amer >90  >90 mL/min   GFR calc Af Amer >90  >90 mL/min  LIPASE, BLOOD       Result Value Range   Lipase 38  11 - 59 U/L  COMPREHENSIVE METABOLIC PANEL      Result Value Range   Sodium 140  135 - 145 mEq/L   Potassium 4.1  3.5 - 5.1 mEq/L   Chloride 108  96 - 112 mEq/L   CO2 23  19 - 32 mEq/L   Glucose, Bld 92  70 - 99 mg/dL   BUN 14  6 - 23 mg/dL   Creatinine, Ser 1.61  0.50 - 1.10 mg/dL   Calcium 8.6  8.4 - 09.6 mg/dL   Total Protein 7.5  6.0 - 8.3 g/dL   Albumin 3.6  3.5 - 5.2 g/dL   AST 045 (*) 0 - 37 U/L   ALT 386 (*) 0 - 35 U/L   Alkaline Phosphatase 100  39 - 117 U/L   Total Bilirubin 0.4  0.3 - 1.2 mg/dL   GFR calc non Af Amer >90  >90 mL/min   GFR calc Af Amer >90  >90 mL/min  LIPASE, BLOOD      Result Value Range   Lipase 37  11 - 59 U/L   US Abdomen Complete  12/03/2012   *RADIOLOGY REPORT*  Clinical Data:  Abdominal pain, elevated LFTs, prior cholecystectomy  COMPLETE ABDOMINAL ULTRASOUND  Comparison:  CT abdomen pelvis dated 09/09/2012  Findings:  Gallbladder:  Surgically absent.  Common bile duct:  Measures 7 mm.  Liver:  No focal lesion identified.  Hyperechoic hepatic parenchyma, possibly reflecting mild hepatic steatosis.  Mild intrahepatic ductal prominence, unchanged from prior CT, likely postsurgical.  IVC:  Appears normal.  Pancreas:  Incompletely visualized but grossly unremarkable.  Spleen:  Measures 4.3 cm.  Right Kidney:  Measures 11.2 cm.  11 x 11 x 12 mm interpolar cyst. No hydronephrosis.  Left Kidney:  Measures 11.0 cm.  No mass or hydronephrosis  Abdominal aorta:  No aneurysm identified.  IMPRESSION: Possible mild hepatic steatosis.  Status post cholecystectomy.  Stable mild intrahepatic ductal prominence, likely postsurgical.  Common duct measures 7 mm, within normal limits for postsurgical appearance.   Original Report Authenticated By: Charline Bills, M.D.   Dg Abd 2 Views  12/03/2012   *RADIOLOGY REPORT*  Clinical Data: Left abdominal pain  ABDOMEN - 2 VIEW  Comparison: 10/31/2012  Findings: No free air.  Vascular clips in the  right upper abdomen. Small bowel decompressed.  Moderate fecal material in the proximal colon, decompressed distally.  No abnormal abdominal calcifications.  Regional bones unremarkable.  IMPRESSION:  Nonobstructive bowel gas pattern with moderate proximal colonic fecal material.   Original Report Authenticated By: D. Andria Rhein, MD     MDM    Spoke with Dr.Hongalgi who will arrange for 23 hour observation inpatient stay. Also spoke with Dr. Dorena Cookey will consult on patient  Diagnoses  abdominal pain Differential diagnosis includes acute hepatitis versus common duct stone    Doug Sou, MD 12/03/12 1625

## 2012-12-03 NOTE — ED Provider Notes (Signed)
History     CSN: [id number]  Arrival date & time 12/03/12  1507   First MD Initiated Contact with Patient 12/03/12 1545      Chief Complaint  Patient presents with  . Abdominal Pain    (Consider location/radiation/quality/duration/timing/severity/associated sxs/prior treatment) HPI Patient returns for continued abdominal pain. She left the emergency department against medical device this morning. She returns with continued abdominal pain and nausea she has not vomited since she left the emergency department she has pain at the epigastrium which worse with eating. No treatment since leaving the emergency department. Pain not improved by anything. Symptoms are coming by nausea. Past Medical History  Diagnosis Date  . Pancreatitis   . S/P laparoscopic cholecystectomy   . Fibroids   . Ovarian cyst   . Chronic abdominal pain   . Anemia     Past Surgical History  Procedure Laterality Date  . Cholecystectomy    . Dilation and curettage of uterus    . Ercp w/ metal stent placement      Family History  Problem Relation Age of Onset  . Diabetes Mother   . Hypertension Mother   . Hypertension Father   . Diabetes Father   . Asthma Daughter     History  Substance Use Topics  . Smoking status: Former Smoker -- 0.50 packs/day for 10 years    Types: Cigarettes    Quit date: 09/01/2012  . Smokeless tobacco: Never Used  . Alcohol Use: No    OB History   Grav Para Term Preterm Abortions TAB SAB Ect Mult Living   4 2 1 1 2  2          Review of Systems  Constitutional: Negative.   HENT: Negative.   Respiratory: Negative.   Cardiovascular: Negative.   Gastrointestinal: Positive for nausea, vomiting and abdominal pain.  Musculoskeletal: Negative.   Skin: Negative.   Neurological: Negative.   Psychiatric/Behavioral: Negative.   All other systems reviewed and are negative.    Allergies  Celecoxib; Fentanyl; Ibuprofen; Ketorolac tromethamine; Meperidine hcl; Morphine;  Ondansetron; Shellfish allergy; Tramadol; Coconut flavor; Propoxyphene-acetaminophen; and Tylenol  Home Medications   Current Outpatient Rx  Name  Route  Sig  Dispense  Refill  . albuterol (PROVENTIL HFA;VENTOLIN HFA) 108 (90 BASE) MCG/ACT inhaler   Inhalation   Inhale 2 puffs into the lungs every 6 (six) hours as needed for wheezing.         . clindamycin (CLEOCIN) 150 MG capsule   Oral   Take 2 capsules (300 mg total) by mouth 3 (three) times daily.   42 capsule   0   . HYDROcodone-acetaminophen (NORCO) 5-325 MG per tablet   Oral   Take 1 tablet by mouth every 6 (six) hours as needed for pain.   10 tablet   0     BP 135/88  Pulse 101  Temp(Src) 98.2 F (36.8 C) (Oral)  Resp 16  Ht 5\' 5"  (1.651 m)  Wt 170 lb (77.111 kg)  BMI 28.29 kg/m2  SpO2 97%  LMP 11/26/2012  Physical Exam  Nursing note and vitals reviewed. Constitutional: She appears well-developed and well-nourished.  HENT:  Head: Normocephalic and atraumatic.  Eyes: Conjunctivae are normal. Pupils are equal, round, and reactive to light.  Neck: Neck supple. No tracheal deviation present. No thyromegaly present.  Cardiovascular: Normal rate and regular rhythm.   No murmur heard. Pulmonary/Chest: Effort normal and breath sounds normal.  Abdominal: Soft. Bowel sounds are normal. She exhibits no  distension. There is tenderness.  Epigastric tenderness  Musculoskeletal: Normal range of motion. She exhibits no edema and no tenderness.  Neurological: She is alert. Coordination normal.  Skin: Skin is warm and dry. No rash noted.  Psychiatric: She has a normal mood and affect.    ED Course  Procedures (including critical care time)  Labs Reviewed - No data to display US Abdomen Complete  12/03/2012   *RADIOLOGY REPORT*  Clinical Data:  Abdominal pain, elevated LFTs, prior cholecystectomy  COMPLETE ABDOMINAL ULTRASOUND  Comparison:  CT abdomen pelvis dated 09/09/2012  Findings:  Gallbladder:  Surgically absent.   Common bile duct:  Measures 7 mm.  Liver:  No focal lesion identified.  Hyperechoic hepatic parenchyma, possibly reflecting mild hepatic steatosis.  Mild intrahepatic ductal prominence, unchanged from prior CT, likely postsurgical.  IVC:  Appears normal.  Pancreas:  Incompletely visualized but grossly unremarkable.  Spleen:  Measures 4.3 cm.  Right Kidney:  Measures 11.2 cm.  11 x 11 x 12 mm interpolar cyst. No hydronephrosis.  Left Kidney:  Measures 11.0 cm.  No mass or hydronephrosis  Abdominal aorta:  No aneurysm identified.  IMPRESSION: Possible mild hepatic steatosis.  Status post cholecystectomy.  Stable mild intrahepatic ductal prominence, likely postsurgical.  Common duct measures 7 mm, within normal limits for postsurgical appearance.   Original Report Authenticated By: Charline Bills, M.D.   Dg Abd 2 Views  12/03/2012   *RADIOLOGY REPORT*  Clinical Data: Left abdominal pain  ABDOMEN - 2 VIEW  Comparison: 10/31/2012  Findings: No free air.  Vascular clips in the right upper abdomen. Small bowel decompressed.  Moderate fecal material in the proximal colon, decompressed distally.  No abnormal abdominal calcifications.  Regional bones unremarkable.  IMPRESSION:  Nonobstructive bowel gas pattern with moderate proximal colonic fecal material.   Original Report Authenticated By: D. Andria Rhein, MD     No diagnosis found.  Results for orders placed during the hospital encounter of 12/03/12  LIPASE, BLOOD      Result Value Range   Lipase 50  11 - 59 U/L  CBC WITH DIFFERENTIAL      Result Value Range   WBC 6.6  4.0 - 10.5 K/uL   RBC 4.40  3.87 - 5.11 MIL/uL   Hemoglobin 12.3  12.0 - 15.0 g/dL   HCT 45.4 (*) 09.8 - 11.9 %   MCV 80.2  78.0 - 100.0 fL   MCH 28.0  26.0 - 34.0 pg   MCHC 34.8  30.0 - 36.0 g/dL   RDW 14.7  82.9 - 56.2 %   Platelets 197  150 - 400 K/uL   Neutrophils Relative % 45  43 - 77 %   Lymphocytes Relative 41  12 - 46 %   Monocytes Relative 11  3 - 12 %   Eosinophils  Relative 3  0 - 5 %   Basophils Relative 0  0 - 1 %   Neutro Abs 3.0  1.7 - 7.7 K/uL   Lymphs Abs 2.7  0.7 - 4.0 K/uL   Monocytes Absolute 0.7  0.1 - 1.0 K/uL   Eosinophils Absolute 0.2  0.0 - 0.7 K/uL   Basophils Absolute 0.0  0.0 - 0.1 K/uL   Smear Review MORPHOLOGY UNREMARKABLE    COMPREHENSIVE METABOLIC PANEL      Result Value Range   Sodium 137  135 - 145 mEq/L   Potassium 3.6  3.5 - 5.1 mEq/L   Chloride 103  96 - 112 mEq/L  CO2 25  19 - 32 mEq/L   Glucose, Bld 100 (*) 70 - 99 mg/dL   BUN 17  6 - 23 mg/dL   Creatinine, Ser 2.13  0.50 - 1.10 mg/dL   Calcium 9.1  8.4 - 08.6 mg/dL   Total Protein 7.9  6.0 - 8.3 g/dL   Albumin 4.0  3.5 - 5.2 g/dL   AST 578 (*) 0 - 37 U/L   ALT 291 (*) 0 - 35 U/L   Alkaline Phosphatase 107  39 - 117 U/L   Total Bilirubin 0.5  0.3 - 1.2 mg/dL   GFR calc non Af Amer >90  >90 mL/min   GFR calc Af Amer >90  >90 mL/min  COMPREHENSIVE METABOLIC PANEL      Result Value Range   Sodium 139  135 - 145 mEq/L   Potassium 4.0  3.5 - 5.1 mEq/L   Chloride 108  96 - 112 mEq/L   CO2 21  19 - 32 mEq/L   Glucose, Bld 97  70 - 99 mg/dL   BUN 15  6 - 23 mg/dL   Creatinine, Ser 4.69  0.50 - 1.10 mg/dL   Calcium 8.6  8.4 - 62.9 mg/dL   Total Protein 7.6  6.0 - 8.3 g/dL   Albumin 3.7  3.5 - 5.2 g/dL   AST 528 (*) 0 - 37 U/L   ALT 395 (*) 0 - 35 U/L   Alkaline Phosphatase 101  39 - 117 U/L   Total Bilirubin 0.5  0.3 - 1.2 mg/dL   GFR calc non Af Amer >90  >90 mL/min   GFR calc Af Amer >90  >90 mL/min  LIPASE, BLOOD      Result Value Range   Lipase 38  11 - 59 U/L  COMPREHENSIVE METABOLIC PANEL      Result Value Range   Sodium 140  135 - 145 mEq/L   Potassium 4.1  3.5 - 5.1 mEq/L   Chloride 108  96 - 112 mEq/L   CO2 23  19 - 32 mEq/L   Glucose, Bld 92  70 - 99 mg/dL   BUN 14  6 - 23 mg/dL   Creatinine, Ser 4.13  0.50 - 1.10 mg/dL   Calcium 8.6  8.4 - 24.4 mg/dL   Total Protein 7.5  6.0 - 8.3 g/dL   Albumin 3.6  3.5 - 5.2 g/dL   AST 010 (*) 0 - 37  U/L   ALT 386 (*) 0 - 35 U/L   Alkaline Phosphatase 100  39 - 117 U/L   Total Bilirubin 0.4  0.3 - 1.2 mg/dL   GFR calc non Af Amer >90  >90 mL/min   GFR calc Af Amer >90  >90 mL/min  LIPASE, BLOOD      Result Value Range   Lipase 37  11 - 59 U/L   US Abdomen Complete  12/03/2012   *RADIOLOGY REPORT*  Clinical Data:  Abdominal pain, elevated LFTs, prior cholecystectomy  COMPLETE ABDOMINAL ULTRASOUND  Comparison:  CT abdomen pelvis dated 09/09/2012  Findings:  Gallbladder:  Surgically absent.  Common bile duct:  Measures 7 mm.  Liver:  No focal lesion identified.  Hyperechoic hepatic parenchyma, possibly reflecting mild hepatic steatosis.  Mild intrahepatic ductal prominence, unchanged from prior CT, likely postsurgical.  IVC:  Appears normal.  Pancreas:  Incompletely visualized but grossly unremarkable.  Spleen:  Measures 4.3 cm.  Right Kidney:  Measures 11.2 cm.  11 x 11 x 12 mm  interpolar cyst. No hydronephrosis.  Left Kidney:  Measures 11.0 cm.  No mass or hydronephrosis  Abdominal aorta:  No aneurysm identified.  IMPRESSION: Possible mild hepatic steatosis.  Status post cholecystectomy.  Stable mild intrahepatic ductal prominence, likely postsurgical.  Common duct measures 7 mm, within normal limits for postsurgical appearance.   Original Report Authenticated By: Charline Bills, M.D.   Dg Abd 2 Views  12/03/2012   *RADIOLOGY REPORT*  Clinical Data: Left abdominal pain  ABDOMEN - 2 VIEW  Comparison: 10/31/2012  Findings: No free air.  Vascular clips in the right upper abdomen. Small bowel decompressed.  Moderate fecal material in the proximal colon, decompressed distally.  No abnormal abdominal calcifications.  Regional bones unremarkable.  IMPRESSION:  Nonobstructive bowel gas pattern with moderate proximal colonic fecal material.   Original Report Authenticated By: D. Andria Rhein, MD     MDM   Spoke with Dr.Hongalgi who will arrange for inpatient stay. Also consult to Dr. Madilyn Fireman who will see  patient while in the hospital. Plan 23 hour observation. Clear liquid diet. Symptomatic control. Diagnosis abdominal pain  Differential diagnosis includes acute hepatitis versus common duct stone       Doug Sou, MD 12/03/12 1706

## 2012-12-03 NOTE — ED Notes (Signed)
Per pt, was seen at urgent care today in [place] and was informed that her lipase was elevated in the 200's and needed to be further evaluated here in the ER.  Pt states this is the highest her enzymes have been in "a while".  Pt localizes pain to her abdomen bilaterally.  Describes pain as "sharp itchy pain", 10/10.  Nad.  Denies sob, cp.  Reports nausea and vomiting.

## 2012-12-03 NOTE — ED Provider Notes (Signed)
History     CSN: [id number]  Arrival date & time 12/03/12  0221   First MD Initiated Contact with Patient 12/03/12 [phone]      Chief Complaint  Patient presents with  . Abdominal Pain    (Consider location/radiation/quality/duration/timing/severity/associated sxs/prior treatment) Patient is a 32 y.o. female presenting with abdominal pain. The history is provided by the patient.  Abdominal Pain Associated symptoms include abdominal pain. Pertinent negatives include no chest pain, no headaches and no shortness of breath.   patient with nausea vomiting and abdominal pain. History of chronic pancreatitis. History also of chronic abdominal pain and possible drug-seeking behavior. She's been in ER 48 times the last 6 months in the [facility]. She also goes to [address] and [address]. She reportedly went to [facility] earlier today. She comes with print out of lab tests saying that her lipase is elevated. She states she was told to come to the hospital for admission. Patient states she has had nausea and vomiting last couple days her pain has been worse for last few days. No fevers. No diarrhea. This is her typical abdominal pain.  Past Medical History  Diagnosis Date  . Pancreatitis   . S/P laparoscopic cholecystectomy   . Fibroids   . Ovarian cyst   . Chronic abdominal pain   . Anemia     Past Surgical History  Procedure Laterality Date  . Cholecystectomy    . Dilation and curettage of uterus    . Ercp w/ metal stent placement      Family History  Problem Relation Age of Onset  . Diabetes Mother   . Hypertension Mother   . Hypertension Father   . Diabetes Father   . Asthma Daughter     History  Substance Use Topics  . Smoking status: Former Smoker -- 0.50 packs/day for 10 years    Types: Cigarettes    Quit date: 09/01/2012  . Smokeless tobacco: Never Used  . Alcohol Use: No    OB History   Grav Para Term Preterm Abortions TAB SAB Ect Mult Living    4 2 1 1 2  2          Review of Systems  Constitutional: Negative for activity change and appetite change.  HENT: Negative for neck stiffness.   Eyes: Negative for pain.  Respiratory: Negative for chest tightness and shortness of breath.   Cardiovascular: Negative for chest pain and leg swelling.  Gastrointestinal: Positive for nausea, vomiting and abdominal pain. Negative for diarrhea.  Genitourinary: Negative for flank pain.  Musculoskeletal: Negative for back pain.  Skin: Negative for rash.  Neurological: Negative for weakness, numbness and headaches.  Psychiatric/Behavioral: Negative for behavioral problems.    Allergies  Celecoxib; Fentanyl; Ibuprofen; Ketorolac tromethamine; Meperidine hcl; Morphine; Ondansetron; Shellfish allergy; Tramadol; Coconut flavor; Propoxyphene-acetaminophen; and Tylenol  Home Medications   Current Outpatient Rx  Name  Route  Sig  Dispense  Refill  . albuterol (PROVENTIL HFA;VENTOLIN HFA) 108 (90 BASE) MCG/ACT inhaler   Inhalation   Inhale 2 puffs into the lungs every 6 (six) hours as needed for wheezing.         . clindamycin (CLEOCIN) 150 MG capsule   Oral   Take 2 capsules (300 mg total) by mouth 3 (three) times daily.   42 capsule   0   . HYDROcodone-acetaminophen (NORCO) 5-325 MG per tablet   Oral   Take 1 tablet by mouth every 6 (six) hours as needed for pain.  10 tablet   0     BP 123/83  Pulse 109  Temp(Src) 98.3 F (36.8 C) (Oral)  Resp 16  Ht 5\' 5"  (1.651 m)  Wt 179 lb 1 oz (81.222 kg)  BMI 29.8 kg/m2  SpO2 100%  LMP 08/19/2012  Physical Exam  Nursing note and vitals reviewed. Constitutional: She is oriented to person, place, and time. She appears well-developed and well-nourished.  HENT:  Head: Normocephalic and atraumatic.  Eyes: EOM are normal. Pupils are equal, round, and reactive to light.  Neck: Normal range of motion. Neck supple.  Cardiovascular: Regular rhythm and normal heart sounds.   No murmur  heard. Mild tachycardia  Pulmonary/Chest: Effort normal and breath sounds normal. No respiratory distress. She has no wheezes. She has no rales.  Abdominal: Soft. Bowel sounds are normal. She exhibits no distension. There is tenderness. There is no rebound and no guarding.  Tenderness in upper abdomen.  Musculoskeletal: Normal range of motion.  Neurological: She is alert and oriented to person, place, and time. No cranial nerve deficit.  Skin: Skin is warm and dry.  Psychiatric: She has a normal mood and affect. Her speech is normal.    ED Course  Procedures (including critical care time)  Labs Reviewed  LIPASE, BLOOD  CBC WITH DIFFERENTIAL  COMPREHENSIVE METABOLIC PANEL   No results found.   No diagnosis found.    MDM  Patient presenting with acute on chronic abdominal pain.she's been seen in the ED multiple times. She was reportedly seen earlier today at [facility]. She brought labs with her that showed an elevated lipase and normal LFTs. She was given pain medicine due to the elevated lipase, however labs were treated. Labs done here showed normal lipase, but elevated AST and ALT. Patient has had a previous pancreatic stent that was removed 2 and half months ago. She was sedated after treatment with pain medicines. Ultrasound has been ordered to look for biliary dilatation.        Juliet Rude. Rubin Payor, MD 12/03/12 [phone]

## 2012-12-03 NOTE — ED Provider Notes (Signed)
Complains of epigastric pain and vomiting for 3 weeks. She states her primary care physician is attempting to get her in to pain clinic for chronic abdominal pain. No fever. On exam she is in no distress abdomen minimally tender at epigastrium. Patient well known to this emergency department has history of chronic pancreatitis and muchausens syndrome . .she was seen at [facility] last night determined to have elevated lipase. She comes in today for further evaluation. Lab work performed her worse normal except for elevated transaminases.  X-rays viewed byme 11:40 AM patient continues to complain of abdominal pain and nausea. She is able to sip water without difficulty. I've offered her admission which he declines. She will leave  AGAINST MEDICAL ADVICE. I explained to her that she needs inpatient evaluation for nausea vomiting abnormal liver functionwhich is new since last week.. She does have a history of drug-seeking behavior.patient is of sound mind to refuse care Results for orders placed during the hospital encounter of 12/03/12  LIPASE, BLOOD      Result Value Range   Lipase 50  11 - 59 U/L  CBC WITH DIFFERENTIAL      Result Value Range   WBC 6.6  4.0 - 10.5 K/uL   RBC 4.40  3.87 - 5.11 MIL/uL   Hemoglobin 12.3  12.0 - 15.0 g/dL   HCT 54.0 (*) 98.1 - 19.1 %   MCV 80.2  78.0 - 100.0 fL   MCH 28.0  26.0 - 34.0 pg   MCHC 34.8  30.0 - 36.0 g/dL   RDW 47.8  29.5 - 62.1 %   Platelets 197  150 - 400 K/uL   Neutrophils Relative % 45  43 - 77 %   Lymphocytes Relative 41  12 - 46 %   Monocytes Relative 11  3 - 12 %   Eosinophils Relative 3  0 - 5 %   Basophils Relative 0  0 - 1 %   Neutro Abs 3.0  1.7 - 7.7 K/uL   Lymphs Abs 2.7  0.7 - 4.0 K/uL   Monocytes Absolute 0.7  0.1 - 1.0 K/uL   Eosinophils Absolute 0.2  0.0 - 0.7 K/uL   Basophils Absolute 0.0  0.0 - 0.1 K/uL   Smear Review MORPHOLOGY UNREMARKABLE    COMPREHENSIVE METABOLIC PANEL      Result Value Range   Sodium 137  135 -  145 mEq/L   Potassium 3.6  3.5 - 5.1 mEq/L   Chloride 103  96 - 112 mEq/L   CO2 25  19 - 32 mEq/L   Glucose, Bld 100 (*) 70 - 99 mg/dL   BUN 17  6 - 23 mg/dL   Creatinine, Ser 3.08  0.50 - 1.10 mg/dL   Calcium 9.1  8.4 - 65.7 mg/dL   Total Protein 7.9  6.0 - 8.3 g/dL   Albumin 4.0  3.5 - 5.2 g/dL   AST 846 (*) 0 - 37 U/L   ALT 291 (*) 0 - 35 U/L   Alkaline Phosphatase 107  39 - 117 U/L   Total Bilirubin 0.5  0.3 - 1.2 mg/dL   GFR calc non Af Amer >90  >90 mL/min   GFR calc Af Amer >90  >90 mL/min  COMPREHENSIVE METABOLIC PANEL      Result Value Range   Sodium 139  135 - 145 mEq/L   Potassium 4.0  3.5 - 5.1 mEq/L   Chloride 108  96 - 112 mEq/L   CO2 21  19 - 32 mEq/L   Glucose, Bld 97  70 - 99 mg/dL   BUN 15  6 - 23 mg/dL   Creatinine, Ser 9.14  0.50 - 1.10 mg/dL   Calcium 8.6  8.4 - 78.2 mg/dL   Total Protein 7.6  6.0 - 8.3 g/dL   Albumin 3.7  3.5 - 5.2 g/dL   AST 956 (*) 0 - 37 U/L   ALT 395 (*) 0 - 35 U/L   Alkaline Phosphatase 101  39 - 117 U/L   Total Bilirubin 0.5  0.3 - 1.2 mg/dL   GFR calc non Af Amer >90  >90 mL/min   GFR calc Af Amer >90  >90 mL/min  LIPASE, BLOOD      Result Value Range   Lipase 38  11 - 59 U/L  COMPREHENSIVE METABOLIC PANEL      Result Value Range   Sodium 140  135 - 145 mEq/L   Potassium 4.1  3.5 - 5.1 mEq/L   Chloride 108  96 - 112 mEq/L   CO2 23  19 - 32 mEq/L   Glucose, Bld 92  70 - 99 mg/dL   BUN 14  6 - 23 mg/dL   Creatinine, Ser 2.13  0.50 - 1.10 mg/dL   Calcium 8.6  8.4 - 08.6 mg/dL   Total Protein 7.5  6.0 - 8.3 g/dL   Albumin 3.6  3.5 - 5.2 g/dL   AST 578 (*) 0 - 37 U/L   ALT 386 (*) 0 - 35 U/L   Alkaline Phosphatase 100  39 - 117 U/L   Total Bilirubin 0.4  0.3 - 1.2 mg/dL   GFR calc non Af Amer >90  >90 mL/min   GFR calc Af Amer >90  >90 mL/min  LIPASE, BLOOD      Result Value Range   Lipase 37  11 - 59 U/L   US Abdomen Complete  12/03/2012   *RADIOLOGY REPORT*  Clinical Data:  Abdominal pain, elevated LFTs, prior  cholecystectomy  COMPLETE ABDOMINAL ULTRASOUND  Comparison:  CT abdomen pelvis dated 09/09/2012  Findings:  Gallbladder:  Surgically absent.  Common bile duct:  Measures 7 mm.  Liver:  No focal lesion identified.  Hyperechoic hepatic parenchyma, possibly reflecting mild hepatic steatosis.  Mild intrahepatic ductal prominence, unchanged from prior CT, likely postsurgical.  IVC:  Appears normal.  Pancreas:  Incompletely visualized but grossly unremarkable.  Spleen:  Measures 4.3 cm.  Right Kidney:  Measures 11.2 cm.  11 x 11 x 12 mm interpolar cyst. No hydronephrosis.  Left Kidney:  Measures 11.0 cm.  No mass or hydronephrosis  Abdominal aorta:  No aneurysm identified.  IMPRESSION: Possible mild hepatic steatosis.  Status post cholecystectomy.  Stable mild intrahepatic ductal prominence, likely postsurgical.  Common duct measures 7 mm, within normal limits for postsurgical appearance.   Original Report Authenticated By: Charline Bills, M.D.   Dg Abd 2 Views  12/03/2012   *RADIOLOGY REPORT*  Clinical Data: Left abdominal pain  ABDOMEN - 2 VIEW  Comparison: 10/31/2012  Findings: No free air.  Vascular clips in the right upper abdomen. Small bowel decompressed.  Moderate fecal material in the proximal colon, decompressed distally.  No abnormal abdominal calcifications.  Regional bones unremarkable.  IMPRESSION:  Nonobstructive bowel gas pattern with moderate proximal colonic fecal material.   Original Report Authenticated By: D. Andria Rhein, MD     Doug Sou, MD 12/03/12 1154

## 2012-12-03 NOTE — H&P (Signed)
[facility] History and Physical  CAMILIA CAYWOOD ZOX:[id number] DOB: July 28, 1980 DOA: 12/03/2012  Referring physician: Dr. Doug Sou, EDP PCP: Ignacia Bayley, MD  OP Specialists:  1. General Surgery: Dr. Chevis Pretty   Chief Complaint: Abdominal pain, nausea, vomiting and diarrhea  HPI: [name] is a 32 y.o. female with PMH of chronic pancreatitis, status post laparoscopic cholecystectomy, chronic abdominal pain, possible drug-seeking behavior, documented Munchausen's syndrome, multiple listed medication allergies, several ED visits (48 times in the last 6 months to the [facility]), seeks care at other hospitals [facility] and [facility]) presented to the [facility] for the second time in 12 hours with complaints of abdominal pain, nausea, vomiting and diarrhea. She claims she started having above symptoms approximately 3 weeks ago. She describes upper abdominal pain, constant, sharp, rates it at 10/10 in severity (although she appears absolutely comfortable), worsened by by mouth intake and no true relieving factors. This has been associated with 3-4 episodes of daily emesis of "white color liquid" without coffee-ground or blood. She also reports several "10 times" daily diarrhea which is clear and yellow color without blood. She complains of feeling hot and cold. All the symptoms apparently have progressively gotten worse. She went to [facility] yesterday and then presented to [facility] ED with printout of lab tests showing elevated lipase. She was apparently sent here for hospital admission. Repeat labs here showed significant transaminitis in the 500s but normal lipase. She was advised hospital admission but signed out AMA because she had to take care of her kids. She now returns with similar symptoms. Repeat labs are unchanged. She gives history of some form of I&D on right ear a few days back and started on Vicodin and  clindamycin. She denies overdosing on Tylenol or Vicodin. She denies alcohol intake or any other new medication. EDP has consulted gastroenterology and hospitalist admission requested.   Review of Systems: All systems reviewed and apart from history of presenting illness, are negative  Past Medical History  Diagnosis Date  . Pancreatitis   . S/P laparoscopic cholecystectomy   . Fibroids   . Ovarian cyst   . Chronic abdominal pain   . Anemia    Past Surgical History  Procedure Laterality Date  . Cholecystectomy    . Dilation and curettage of uterus    . Ercp w/ metal stent placement     Social History:  reports that she has been smoking Cigarettes.  She has a 2.5 pack-year smoking history. She has never used smokeless tobacco. She reports that she does not drink alcohol or use illicit drugs. Independent of activities of daily living.  Allergies  Allergen Reactions  . Celecoxib Anaphylaxis and Swelling  . Fentanyl Anaphylaxis and Rash  . Ibuprofen Anaphylaxis and Rash  . Ketorolac Tromethamine Anaphylaxis  . Meperidine Hcl Anaphylaxis  . Morphine Anaphylaxis and Rash  . Ondansetron Anaphylaxis  . Shellfish Allergy Anaphylaxis    Crab and lobster  . Tramadol Anaphylaxis and Rash  . Coconut Flavor Swelling    Trouble breathing Trouble breathing  . Propoxyphene-Acetaminophen Rash  . Tylenol (Acetaminophen) Hives, Itching and Rash    Family History  Problem Relation Age of Onset  . Diabetes Mother   . Hypertension Mother   . Hypertension Father   . Diabetes Father   . Asthma Daughter     Prior to Admission medications   Medication Sig Start Date End Date Taking? Authorizing Provider  albuterol (PROVENTIL  HFA;VENTOLIN HFA) 108 (90 BASE) MCG/ACT inhaler Inhale 2 puffs into the lungs every 6 (six) hours as needed for wheezing.    Historical Provider, MD  clindamycin (CLEOCIN) 150 MG capsule Take 2 capsules (300 mg total) by mouth 3 (three) times daily. 11/29/12   Tatyana A  Kirichenko, PA-C  HYDROcodone-acetaminophen (NORCO) 5-325 MG per tablet Take 1 tablet by mouth every 6 (six) hours as needed for pain. 11/29/12   Lottie Mussel, PA-C   Physical Exam: Filed Vitals:   12/03/12 1519 12/03/12 1648  BP: 135/88 127/80  Pulse: 101 90  Temp: 98.2 F (36.8 C) 98 F (36.7 C)  TempSrc: Oral Oral  Resp: 16 20  Height: 5\' 5"  (1.651 m)   Weight: 77.111 kg (170 lb)   SpO2: 97% 98%   Patient was examined with a female ED nurse in the room.  General exam: Moderately built and nourished female patient, lying comfortably supine on the gurney in no obvious distress.  Head, eyes and ENT: Nontraumatic and normocephalic. Pupils equally reacting to light and accommodation. Oral mucosa moist. Piercing of tongue and upper lip.  Neck: Supple. No JVD, carotid bruit or thyromegaly.  Lymphatics: No lymphadenopathy.  Respiratory system: Clear to auscultation. No increased work of breathing.  Cardiovascular system: S1 and S2 heard, RRR. No JVD, murmurs, gallops, clicks or pedal edema.  Gastrointestinal system: Abdomen is nondistended and soft. Mild tenderness of upper quadrants but without rigidity, guarding or rebound. Normal bowel sounds heard. No organomegaly or masses appreciated. Laparoscopy scars appreciated.  Central nervous system: Alert and oriented. No focal neurological deficits.  Extremities: Symmetric 5 x 5 power. Peripheral pulses symmetrically felt.  Skin: No rashes or acute findings.  Musculoskeletal system: Negative exam.  Psychiatry: Pleasant and cooperative.   Labs on Admission:  Basic Metabolic Panel:  Recent Labs Lab 12/03/12 0439 12/03/12 0730 12/03/12 0854  NA 137 139 140  K 3.6 4.0 4.1  CL 103 108 108  CO2 25 21 23   GLUCOSE 100* 97 92  BUN 17 15 14   CREATININE 0.69 0.60 0.65  CALCIUM 9.1 8.6 8.6   Liver Function Tests:  Recent Labs Lab 12/03/12 0439 12/03/12 0730 12/03/12 0854  AST 526* 661* 576*  ALT 291* 395* 386*   ALKPHOS 107 101 100  BILITOT 0.5 0.5 0.4  PROT 7.9 7.6 7.5  ALBUMIN 4.0 3.7 3.6    Recent Labs Lab 12/03/12 0439 12/03/12 0730 12/03/12 0854  LIPASE 50 38 37   No results found for this basename: AMMONIA,  in the last 168 hours CBC:  Recent Labs Lab 12/03/12 0439  WBC 6.6  NEUTROABS 3.0  HGB 12.3  HCT 35.3*  MCV 80.2  PLT 197   Cardiac Enzymes: No results found for this basename: CKTOTAL, CKMB, CKMBINDEX, TROPONINI,  in the last 168 hours  BNP (last 3 results) No results found for this basename: PROBNP,  in the last 8760 hours CBG: No results found for this basename: GLUCAP,  in the last 168 hours  Radiological Exams on Admission: US Abdomen Complete  12/03/2012   *RADIOLOGY REPORT*  Clinical Data:  Abdominal pain, elevated LFTs, prior cholecystectomy  COMPLETE ABDOMINAL ULTRASOUND  Comparison:  CT abdomen pelvis dated 09/09/2012  Findings:  Gallbladder:  Surgically absent.  Common bile duct:  Measures 7 mm.  Liver:  No focal lesion identified.  Hyperechoic hepatic parenchyma, possibly reflecting mild hepatic steatosis.  Mild intrahepatic ductal prominence, unchanged from prior CT, likely postsurgical.  IVC:  Appears normal.  Pancreas:  Incompletely visualized but grossly unremarkable.  Spleen:  Measures 4.3 cm.  Right Kidney:  Measures 11.2 cm.  11 x 11 x 12 mm interpolar cyst. No hydronephrosis.  Left Kidney:  Measures 11.0 cm.  No mass or hydronephrosis  Abdominal aorta:  No aneurysm identified.  IMPRESSION: Possible mild hepatic steatosis.  Status post cholecystectomy.  Stable mild intrahepatic ductal prominence, likely postsurgical.  Common duct measures 7 mm, within normal limits for postsurgical appearance.   Original Report Authenticated By: Charline Bills, M.D.   Dg Abd 2 Views  12/03/2012   *RADIOLOGY REPORT*  Clinical Data: Left abdominal pain  ABDOMEN - 2 VIEW  Comparison: 10/31/2012  Findings: No free air.  Vascular clips in the right upper abdomen. Small bowel  decompressed.  Moderate fecal material in the proximal colon, decompressed distally.  No abnormal abdominal calcifications.  Regional bones unremarkable.  IMPRESSION:  Nonobstructive bowel gas pattern with moderate proximal colonic fecal material.   Original Report Authenticated By: D. Andria Rhein, MD     Assessment/Plan Principal Problem:   Transaminitis Active Problems:   DEPRESSION, MAJOR, RECURRENT   MUNCHAUSEN'S SYNDROME   GERD   PANCREATITIS, CHRONIC   CHOLECYSTECTOMY, HX OF   Nausea vomiting, diarrhea & abdominal pain   1. Transaminitis/? Hepatitis: Associated with a reported nausea, vomiting, abdominal pain and diarrhea. DD-? Clindamycin  related, acute viral hepatitis, CBD stone although pictures more appetite take rather than obstructive and no obvious stone or dilatation on abdominal ultrasound, rule out acetaminophen toxicity (less likely) versus other etiology. Admit to medical floor for observation and evaluation. Check acute hepatitis panel. Trend daily LFTs. Followup on acetaminophen level and INR. [facility] gastroenterology consulted by EDP-await recommendations. 2. Nausea, vomiting, abdominal pain & diarrhea:?? Reliable history. For somebody who has been vomiting and having diarrhea that many times, her mucosa does not standout for significant dehydration and KUB shows moderate proximal colonic fecal material. Patient advised to call nursing each time that she has emesis or diarrhea so that can be witnessed and documented. Recently on clindamycin and hence will rule out C. difficile. Placed on contact isolation. Stool for C. difficile PCR, culture, O & P. Clear liquid diet and monitor. 3. Tobacco abuse: Cessation counseled. 4. History of chronic abdominal pain/chronic pancreatitis/possible drug-seeking behavior/?Munchausen's syndrome/multiple medication allergies: Minimize and try not to escalate pain medications, Phenergan or Benadryl.    Code Status: Full  Family Communication:  Discussed with Mother at bedside.  Disposition Plan: Home when stable   Time spent: 55 minutes.  [facility] Pager [phone]  If 7PM-7AM, please contact night-coverage [email or web address] Password [facility] 12/03/2012, 5:06 PM

## 2012-12-03 NOTE — ED Notes (Addendum)
Patient transported to US 

## 2012-12-03 NOTE — ED Notes (Signed)
IV team notified of IV start. Family at bedside. States pain at a 10/10.

## 2012-12-03 NOTE — ED Notes (Signed)
Return from Ultrasound.

## 2012-12-04 DIAGNOSIS — Z87448 Personal history of other diseases of urinary system: Secondary | ICD-10-CM

## 2012-12-04 DIAGNOSIS — K861 Other chronic pancreatitis: Secondary | ICD-10-CM

## 2012-12-04 DIAGNOSIS — F6812 Factitious disorder with predominantly physical signs and symptoms: Secondary | ICD-10-CM

## 2012-12-04 DIAGNOSIS — R109 Unspecified abdominal pain: Secondary | ICD-10-CM

## 2012-12-04 DIAGNOSIS — F339 Major depressive disorder, recurrent, unspecified: Secondary | ICD-10-CM

## 2012-12-04 LAB — URINALYSIS W MICROSCOPIC + REFLEX CULTURE
Glucose, UA: NEGATIVE mg/dL
Hgb urine dipstick: NEGATIVE
Specific Gravity, Urine: 1.008 (ref 1.005–1.030)
Urobilinogen, UA: 0.2 mg/dL (ref 0.0–1.0)
pH: 7 (ref 5.0–8.0)

## 2012-12-04 LAB — RAPID URINE DRUG SCREEN, HOSP PERFORMED
Benzodiazepines: NOT DETECTED
Cocaine: NOT DETECTED
Opiates: NOT DETECTED

## 2012-12-04 LAB — HEPATIC FUNCTION PANEL
ALT: 228 U/L — ABNORMAL HIGH (ref 0–35)
AST: 151 U/L — ABNORMAL HIGH (ref 0–37)
Albumin: 3.2 g/dL — ABNORMAL LOW (ref 3.5–5.2)

## 2012-12-04 LAB — PREGNANCY, URINE: Preg Test, Ur: NEGATIVE

## 2012-12-04 MED ORDER — PANCRELIPASE (LIP-PROT-AMYL) 12000-38000 UNITS PO CPEP
2.0000 | ORAL_CAPSULE | Freq: Three times a day (TID) | ORAL | Status: DC
  Administered 2012-12-04 – 2012-12-06 (×8): 2 via ORAL
  Filled 2012-12-04 (×10): qty 2

## 2012-12-04 NOTE — Consult Note (Signed)
[facility] Gastroenterology Consultation Note  Referring Provider: Dr. Marcellus Scott [facility]) Primary Care Physician:  Ignacia Bayley, MD Primary Gastroenterologist:  Dr. Othelia Pulling [facility])   Reason for Consultation:  Abdominal Pain  HPI: [name] is a 32 y.o. female admitted for abdominal pain.  Patient has history of chronic pancreatitis and pancreas divisum with intermittent pancreatitis.  She has had extensive evaluation here and at multiple medical centers, and has had innumerable imaging studies at least here and at [facility].  Most recently, she had ERCP with minor papillotomy with pancreatic duct stent placement (and subsequent removal), which didn't help her abdominal pain.  Endorses persistent abdominal pain, nausea, vomiting, and diarrhea, all worse after eating.  No blood in stool.  No weight loss.  Had elevation of her LFTs both during this admission, and during recent prior admission at [facility], both times which improved during her hospital course.  Has had MRCP for this at [facility] a few months ago, with no choledocholithiasis seen; she is post-cholecystectomy; recent lipase here normal.   Past Medical History  Diagnosis Date  . Pancreatitis   . S/P laparoscopic cholecystectomy   . Fibroids   . Ovarian cyst   . Chronic abdominal pain   . Anemia     Past Surgical History  Procedure Laterality Date  . Cholecystectomy    . Dilation and curettage of uterus    . Ercp w/ metal stent placement      Prior to Admission medications   Medication Sig Start Date End Date Taking? Authorizing Provider  albuterol (PROVENTIL HFA;VENTOLIN HFA) 108 (90 BASE) MCG/ACT inhaler Inhale 2 puffs into the lungs every 6 (six) hours as needed for wheezing.    Historical Provider, MD  clindamycin (CLEOCIN) 150 MG capsule Take 2 capsules (300 mg total) by mouth 3 (three) times daily. 11/29/12   Tatyana A Kirichenko, PA-C  HYDROcodone-acetaminophen (NORCO)  5-325 MG per tablet Take 1 tablet by mouth every 6 (six) hours as needed for pain. 11/29/12   Tatyana A Kirichenko, PA-C    Current Facility-Administered Medications  Medication Dose Route Frequency Provider Last Rate Last Dose  . 0.9 %  sodium chloride infusion   Intravenous Continuous Elease Etienne, MD 75 mL/hr at 12/04/12 0248    . albuterol (PROVENTIL HFA;VENTOLIN HFA) 108 (90 BASE) MCG/ACT inhaler 2 puff  2 puff Inhalation Q6H PRN Elease Etienne, MD      . diphenhydrAMINE (BENADRYL) 25 mg in sodium chloride 0.9 % 50 mL IVPB  25 mg Intravenous Q6H PRN Violet Baldy Reidler, PA-C   25 mg at 12/04/12 0601  . enoxaparin (LOVENOX) injection 40 mg  40 mg Subcutaneous Q24H Elease Etienne, MD      . HYDROmorphone (DILAUDID) injection 0.5 mg  0.5 mg Intravenous Q4H PRN Elease Etienne, MD   0.5 mg at 12/04/12 0604  . promethazine (PHENERGAN) tablet 12.5 mg  12.5 mg Oral Q6H PRN Elease Etienne, MD        Allergies as of 12/03/2012 - Review Complete 12/03/2012  Allergen Reaction Noted  . Celecoxib Anaphylaxis and Swelling 10/28/2009  . Fentanyl Anaphylaxis and Rash 09/04/2011  . Ibuprofen Anaphylaxis and Rash 07/09/2011  . Ketorolac tromethamine Anaphylaxis 10/28/2009  . Meperidine hcl Anaphylaxis 10/28/2009  . Morphine Anaphylaxis and Rash   . Ondansetron Anaphylaxis 07/05/2012  . Shellfish allergy Anaphylaxis 08/13/2012  . Tramadol Anaphylaxis and Rash 07/05/2012  . Coconut flavor Swelling 12/06/2011  . Propoxyphene-acetaminophen  Rash 10/19/2007  . Tylenol (acetaminophen) Hives, Itching, and Rash 07/19/2012    Family History  Problem Relation Age of Onset  . Diabetes Mother   . Hypertension Mother   . Hypertension Father   . Diabetes Father   . Asthma Daughter     History   Social History  . Marital Status: Single    Spouse Name: N/A    Number of Children: N/A  . Years of Education: N/A   Occupational History  . stay at home mom    Social History Main Topics  .  Smoking status: Current Every Day Smoker -- 0.25 packs/day for 10 years    Types: Cigarettes    Last Attempt to Quit: 09/01/2012  . Smokeless tobacco: Never Used  . Alcohol Use: No  . Drug Use: No  . Sexually Active: Yes    Birth Control/ Protection: Surgical   Other Topics Concern  . Not on file   Social History Narrative  . No narrative on file    Review of Systems: As per HPI, all others negative  Physical Exam: Vital signs in last 24 hours: Temp:  [98 F (36.7 C)-98.6 F (37 C)] 98.1 F (36.7 C) (06/02 0606) Pulse Rate:  [75-101] 86 (06/02 0606) Resp:  [14-20] 16 (06/02 0606) BP: (103-136)/(59-88) 122/76 mmHg (06/02 0606) SpO2:  [95 %-100 %] 100 % (06/02 0606) Weight:  [77.111 kg (170 lb)] 77.111 kg (170 lb) (06/01 1519)   General:   Alert,  Well-developed, well-nourished, immaculately-coiffed and well-manicured Head:  Normocephalic and atraumatic. Eyes:  Sclera clear, no icterus.   Conjunctiva pink. Ears:  Normal auditory acuity. Nose:  No deformity, discharge,  or lesions. Mouth:  No deformity or lesions.  Oropharynx pink & moist. Neck:  Supple; no masses or thyromegaly. Lungs:  Clear throughout to auscultation.   No wheezes, crackles, or rhonchi. No acute distress. Heart:  Regular rate and rhythm; no murmurs, clicks, rubs,  or gallops. Abdomen:  Soft,nondistended, mild diffuse tenderness. No masses, hepatosplenomegaly or hernias noted. Normal bowel sounds, without guarding, and without rebound.     Msk:  Symmetrical without gross deformities. Normal posture. Pulses:  Normal pulses noted. Extremities:  Without clubbing or edema. Neurologic:  Alert and  oriented x4;  Depressed mood, flat affect Skin:  Intact without significant lesions or rashes. Psych:  Alert and cooperative. Normal mood and affect.   Lab Results:  Recent Labs  12/03/12 0439  WBC 6.6  HGB 12.3  HCT 35.3*  PLT 197   BMET  Recent Labs  12/03/12 0439 12/03/12 0730 12/03/12 0854  NA  137 139 140  K 3.6 4.0 4.1  CL 103 108 108  CO2 25 21 23   GLUCOSE 100* 97 92  BUN 17 15 14   CREATININE 0.69 0.60 0.65  CALCIUM 9.1 8.6 8.6   LFT  Recent Labs  12/04/12 0540  PROT 6.9  ALBUMIN 3.2*  AST 151*  ALT 228*  ALKPHOS 100  BILITOT 0.4  BILIDIR <0.1  IBILI NOT CALCULATED   PT/INR  Recent Labs  12/03/12 1715  LABPROT 13.4  INR 1.03    Studies/Results: US Abdomen Complete  12/03/2012   *RADIOLOGY REPORT*  Clinical Data:  Abdominal pain, elevated LFTs, prior cholecystectomy  COMPLETE ABDOMINAL ULTRASOUND  Comparison:  CT abdomen pelvis dated 09/09/2012  Findings:  Gallbladder:  Surgically absent.  Common bile duct:  Measures 7 mm.  Liver:  No focal lesion identified.  Hyperechoic hepatic parenchyma, possibly reflecting mild hepatic steatosis.  Mild intrahepatic ductal prominence, unchanged from prior CT, likely postsurgical.  IVC:  Appears normal.  Pancreas:  Incompletely visualized but grossly unremarkable.  Spleen:  Measures 4.3 cm.  Right Kidney:  Measures 11.2 cm.  11 x 11 x 12 mm interpolar cyst. No hydronephrosis.  Left Kidney:  Measures 11.0 cm.  No mass or hydronephrosis  Abdominal aorta:  No aneurysm identified.  IMPRESSION: Possible mild hepatic steatosis.  Status post cholecystectomy.  Stable mild intrahepatic ductal prominence, likely postsurgical.  Common duct measures 7 mm, within normal limits for postsurgical appearance.   Original Report Authenticated By: Charline Bills, M.D.   Dg Abd 2 Views  12/03/2012   *RADIOLOGY REPORT*  Clinical Data: Left abdominal pain  ABDOMEN - 2 VIEW  Comparison: 10/31/2012  Findings: No free air.  Vascular clips in the right upper abdomen. Small bowel decompressed.  Moderate fecal material in the proximal colon, decompressed distally.  No abnormal abdominal calcifications.  Regional bones unremarkable.  IMPRESSION:  Nonobstructive bowel gas pattern with moderate proximal colonic fecal material.   Original Report Authenticated By:  D. Andria Rhein, MD    Impression:  1.  Abdominal pain.  May have component of chronic pancreatitis, especially with post-prandial diarrhea.  However, there is great discord between reported severity of her symptoms and her clinical appearance; I suspect there is degree of pain-seeking behavior.  Patient has had extensive evaluation at multiple medical centers throughout the region.  2.  Pancreas divisum, with minor papillotomy and pancreatic duct stent (minor papilla) placement and removal.  Unclear whether this has anything to do with either her abdominal pain or prior history of pancreatitis.  3.  Diarrhea.  May have component of chronic pancreatitis, but I suspect this could largely be factitious as well. 4.  Elevated LFTs.  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  Downtrending during hospitalization.  Has had multiple prior MRCPs and ultrasounds, here and at multiple other hospitals. Doubt acetaminophen toxicity, has been empirically treated for this at least once for similar symptoms during prior hospitalization at [facility].  Plan:  1.  Trial of pancreatic enzymes, to see if this helps with her pain and/or diarrhea. 2.  GI pathogen stool panel and C. Diff PCR. 3.  Agree with detailed documentation of patient's nausea, vomiting, and diarrhea, to see if patient's reports of her symptoms correlate with nursing observation. 4.  If diarrhea persists, and symptoms don't improve with pancreatic enzymes, and stool studies are unrevealing, could consider urine and serum laxative screen. 5.  Would markedly wean narcotic regimen and advance diet.    6.  Patient would likely benefit from inpatient Pain Management and psychiatry consultations. 7.  I would staunchly recommend avoiding any further GI tract imaging or endoscopic evaluation, in absence of new and compelling and directly observed symptoms.   LOS: 1 day   Dayle Mcnerney M  12/04/2012, 8:16 AM

## 2012-12-04 NOTE — Consult Note (Signed)
Reason for Consult: Munchausen syndrome and depression Referring Physician: Dr. Berneice Gandy is an 32 y.o. female.  HPI: Patient is seen and chart reviewed. Patient was evaluated and her mother was at bedside. Patient is known to this provider from her outpatient psychiatric visits for her children with attention deficit hyperactive disorder. Patient denied symptoms of depression, anxiety, mania, psychosis. Patient has denied suicidal ideation, intentions or plans. she has no evidence of psychotic symptoms. Patient can contract for safety. Patient has a 3 children and older 2 boys who are diagnosed with ADHD and her younger child was 20-year-old. Patient mother supportive to the patient. Patient has no previous history of psychiatric hospitalization or psychiatric outpatient treatment. Patient denies history of alcoholism and drug of Abuse.  Patient works as a in home health care.  Mental Status Examination: Patient appeared as per his stated age, lying on her bed with the her 48-year-old baby girl :  she is casually dressed, and fairly groomed, and maintaining good eye contact. Patient has  fine  mood and his affect was  appropriate right and for. He has normal rate, rhythm, and volume of speech.  patient  thought process is linear and goal directed. Patient has denied suicidal, homicidal ideations, intentions or plans. Patient has no evidence of auditory or visual hallucinations, delusions, and paranoia. Patient has fair insight judgment and impulse control.  Past Medical History  Diagnosis Date  . Pancreatitis   . S/P laparoscopic cholecystectomy   . Fibroids   . Ovarian cyst   . Chronic abdominal pain   . Anemia     Past Surgical History  Procedure Laterality Date  . Cholecystectomy    . Dilation and curettage of uterus    . Ercp w/ metal stent placement      Family History  Problem Relation Age of Onset  . Diabetes Mother   . Hypertension Mother   . Hypertension Father   .  Diabetes Father   . Asthma Daughter     Social History:  reports that she has been smoking Cigarettes.  She has a 2.5 pack-year smoking history. She has never used smokeless tobacco. She reports that she does not drink alcohol or use illicit drugs.  Allergies:  Allergies  Allergen Reactions  . Celecoxib Anaphylaxis and Swelling  . Fentanyl Anaphylaxis and Rash  . Ibuprofen Anaphylaxis and Rash  . Ketorolac Tromethamine Anaphylaxis  . Meperidine Hcl Anaphylaxis  . Morphine Anaphylaxis and Rash  . Ondansetron Anaphylaxis  . Shellfish Allergy Anaphylaxis    Crab and lobster  . Tramadol Anaphylaxis and Rash  . Coconut Flavor Swelling    Trouble breathing Trouble breathing  . Propoxyphene-Acetaminophen Rash  . Tylenol (Acetaminophen) Hives, Itching and Rash    Medications: I have reviewed the patient's current medications.  Results for orders placed during the hospital encounter of 12/03/12 (from the past 48 hour(s))  PROTIME-INR     Status: None   Collection Time    12/03/12  5:15 PM      Result Value Range   Prothrombin Time 13.4  11.6 - 15.2 seconds   INR 1.03  0.00 - 1.49  ACETAMINOPHEN LEVEL     Status: None   Collection Time    12/03/12  5:15 PM      Result Value Range   Acetaminophen (Tylenol), Serum <15.0  10 - 30 ug/mL   Comment:            THERAPEUTIC CONCENTRATIONS VARY  SIGNIFICANTLY. A RANGE OF 10-30     ug/mL MAY BE AN EFFECTIVE     CONCENTRATION FOR MANY PATIENTS.     HOWEVER, SOME ARE BEST TREATED     AT CONCENTRATIONS OUTSIDE THIS     RANGE.     ACETAMINOPHEN CONCENTRATIONS     >150 ug/mL AT 4 HOURS AFTER     INGESTION AND >50 ug/mL AT 12     HOURS AFTER INGESTION ARE     OFTEN ASSOCIATED WITH TOXIC     REACTIONS.  HEPATIC FUNCTION PANEL     Status: Abnormal   Collection Time    12/04/12  5:40 AM      Result Value Range   Total Protein 6.9  6.0 - 8.3 g/dL   Albumin 3.2 (*) 3.5 - 5.2 g/dL   AST 161 (*) 0 - 37 U/L   ALT 228 (*) 0 - 35 U/L    Alkaline Phosphatase 100  39 - 117 U/L   Total Bilirubin 0.4  0.3 - 1.2 mg/dL   Bilirubin, Direct <0.9  0.0 - 0.3 mg/dL   Indirect Bilirubin NOT CALCULATED  0.3 - 0.9 mg/dL    US Abdomen Complete  12/03/2012   *RADIOLOGY REPORT*  Clinical Data:  Abdominal pain, elevated LFTs, prior cholecystectomy  COMPLETE ABDOMINAL ULTRASOUND  Comparison:  CT abdomen pelvis dated 09/09/2012  Findings:  Gallbladder:  Surgically absent.  Common bile duct:  Measures 7 mm.  Liver:  No focal lesion identified.  Hyperechoic hepatic parenchyma, possibly reflecting mild hepatic steatosis.  Mild intrahepatic ductal prominence, unchanged from prior CT, likely postsurgical.  IVC:  Appears normal.  Pancreas:  Incompletely visualized but grossly unremarkable.  Spleen:  Measures 4.3 cm.  Right Kidney:  Measures 11.2 cm.  11 x 11 x 12 mm interpolar cyst. No hydronephrosis.  Left Kidney:  Measures 11.0 cm.  No mass or hydronephrosis  Abdominal aorta:  No aneurysm identified.  IMPRESSION: Possible mild hepatic steatosis.  Status post cholecystectomy.  Stable mild intrahepatic ductal prominence, likely postsurgical.  Common duct measures 7 mm, within normal limits for postsurgical appearance.   Original Report Authenticated By: Charline Bills, M.D.   Dg Abd 2 Views  12/03/2012   *RADIOLOGY REPORT*  Clinical Data: Left abdominal pain  ABDOMEN - 2 VIEW  Comparison: 10/31/2012  Findings: No free air.  Vascular clips in the right upper abdomen. Small bowel decompressed.  Moderate fecal material in the proximal colon, decompressed distally.  No abnormal abdominal calcifications.  Regional bones unremarkable.  IMPRESSION:  Nonobstructive bowel gas pattern with moderate proximal colonic fecal material.   Original Report Authenticated By: D. Andria Rhein, MD    Positive for anxiety and bad mood Blood pressure 122/76, pulse 86, temperature 98.1 F (36.7 C), temperature source Oral, resp. rate 16, height 5\' 5"  (1.651 m), weight 170 lb (77.111  kg), last menstrual period 11/26/2012, SpO2 100.00%.   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 Patient does not meet criteria for acute psychiatric hospitalization.  2. Patient contracts for safety and has no safety issues at this time.  3. No medication recommendation given at this time 4. Appreciate psychiatric consultation and will sign off  Elia Nunley,JANARDHAHA R. 12/04/2012, 1:20 PM

## 2012-12-04 NOTE — Progress Notes (Signed)
[facility] PROGRESS NOTE  YOSSELYN TAX ZOX:[id number] DOB: 12-24-1980 DOA: 12/03/2012 PCP: Ignacia Bayley, MD  Assessment/Plan: Christel Mormon -Suspect alcohol/exogenous substance ingestion -Patient has documented history of Munchausen's syndrome -LFTs trending down in 24 hours -Continue to trend LFTs -Discussed case with GI--> of foot continued aggressive workup -Patient has had extensive workup for multiple GI doctors including at [facility] -Symptomatic management -Followup viral hepatitis serologies -Check HIV -Urine drug screen -Acetaminophen level undetectable -12/03/2012 abdominal ultrasound--fatty liver, CBD 7mm Chronic abdominal pain -Long discussion with the patient today--I have told her that I would not be giving her any further opioid medications -Patient has documented history of drug seeking behavior with prescriptions from numerous providers -Gradually advance diet as tolerated -Trial of pancreatic enzyme replacement Chronic pancreatitis -Management per gastroenterology -Patient had pancreatic duct stent removed 09/19/2012 at [facility] Major depression/Munchausen syndrome -Consulted  Psychiatry today Intractable vomiting and diarrhea -Patient has not had any vomiting or loose stools this morning -I have asked the patient to notify the nursing staff each time she has any emesis or diarrhea -C. difficile PCR given recent use of clindamycin -Continue IV fluids -Case was discussed with Dr. Dulce Sellar today Tobacco abuse -Tobacco cessation discussed   Family Communication:   Mother at beside Disposition Plan:   Home when medically stable     Procedures/Studies: US Abdomen Complete  12/03/2012   *RADIOLOGY REPORT*  Clinical Data:  Abdominal pain, elevated LFTs, prior cholecystectomy  COMPLETE ABDOMINAL ULTRASOUND  Comparison:  CT abdomen pelvis dated 09/09/2012  Findings:  Gallbladder:  Surgically absent.  Common bile duct:  Measures 7 mm.  Liver:  No focal  lesion identified.  Hyperechoic hepatic parenchyma, possibly reflecting mild hepatic steatosis.  Mild intrahepatic ductal prominence, unchanged from prior CT, likely postsurgical.  IVC:  Appears normal.  Pancreas:  Incompletely visualized but grossly unremarkable.  Spleen:  Measures 4.3 cm.  Right Kidney:  Measures 11.2 cm.  11 x 11 x 12 mm interpolar cyst. No hydronephrosis.  Left Kidney:  Measures 11.0 cm.  No mass or hydronephrosis  Abdominal aorta:  No aneurysm identified.  IMPRESSION: Possible mild hepatic steatosis.  Status post cholecystectomy.  Stable mild intrahepatic ductal prominence, likely postsurgical.  Common duct measures 7 mm, within normal limits for postsurgical appearance.   Original Report Authenticated By: Charline Bills, M.D.   Dg Abd 2 Views  12/03/2012   *RADIOLOGY REPORT*  Clinical Data: Left abdominal pain  ABDOMEN - 2 VIEW  Comparison: 10/31/2012  Findings: No free air.  Vascular clips in the right upper abdomen. Small bowel decompressed.  Moderate fecal material in the proximal colon, decompressed distally.  No abnormal abdominal calcifications.  Regional bones unremarkable.  IMPRESSION:  Nonobstructive bowel gas pattern with moderate proximal colonic fecal material.   Original Report Authenticated By: D. Andria Rhein, MD         Subjective: Patient states that she has not had any emesis or loose stool this morning. Denies any chest discomfort, shortness breath, fevers, chills, dizziness, dysuria.  Objective: Filed Vitals:   12/03/12 1648 12/03/12 1753 12/03/12 2122 12/04/12 0606  BP: 127/80 123/83 103/59 122/76  Pulse: 90 96 80 86  Temp: 98 F (36.7 C) 98.1 F (36.7 C) 98.6 F (37 C) 98.1 F (36.7 C)  TempSrc: Oral Oral Oral Oral  Resp: 20 17 16 16   Height:      Weight:      SpO2: 98% 100% 100% 100%    Intake/Output Summary (Last 24 hours) at 12/04/12 0843 Last  data filed at 12/04/12 0601  Gross per 24 hour  Intake    865 ml  Output      0 ml  Net     865 ml   Weight change:  Exam:   General:  Pt is alert, follows commands appropriately, not in acute distress  HEENT: No icterus, No thrush, [place]/AT  Cardiovascular: RRR, S1/S2, no rubs, no gallops  Respiratory: CTA bilaterally, no wheezing, no crackles, no rhonchi  Abdomen: Soft/+BS, mild epigastric tenderness, non distended, no guarding  Extremities: No edema, No lymphangitis, No petechiae, No rashes, no synovitis  Data Reviewed: Basic Metabolic Panel:  Recent Labs Lab 12/03/12 0439 12/03/12 0730 12/03/12 0854  NA 137 139 140  K 3.6 4.0 4.1  CL 103 108 108  CO2 25 21 23   GLUCOSE 100* 97 92  BUN 17 15 14   CREATININE 0.69 0.60 0.65  CALCIUM 9.1 8.6 8.6   Liver Function Tests:  Recent Labs Lab 12/03/12 0439 12/03/12 0730 12/03/12 0854 12/04/12 0540  AST 526* 661* 576* 151*  ALT 291* 395* 386* 228*  ALKPHOS 107 101 100 100  BILITOT 0.5 0.5 0.4 0.4  PROT 7.9 7.6 7.5 6.9  ALBUMIN 4.0 3.7 3.6 3.2*    Recent Labs Lab 12/03/12 0439 12/03/12 0730 12/03/12 0854  LIPASE 50 38 37   No results found for this basename: AMMONIA,  in the last 168 hours CBC:  Recent Labs Lab 12/03/12 0439  WBC 6.6  NEUTROABS 3.0  HGB 12.3  HCT 35.3*  MCV 80.2  PLT 197   Cardiac Enzymes: No results found for this basename: CKTOTAL, CKMB, CKMBINDEX, TROPONINI,  in the last 168 hours BNP: No components found with this basename: POCBNP,  CBG: No results found for this basename: GLUCAP,  in the last 168 hours  No results found for this or any previous visit (from the past 240 hour(s)).   Scheduled Meds: . enoxaparin (LOVENOX) injection  40 mg Subcutaneous Q24H  . lipase/protease/amylase  2 capsule Oral TID WC   Continuous Infusions: . sodium chloride 75 mL/hr at 12/04/12 0248     Angeletta Goelz, DO  [facility] Pager [phone]  If 7PM-7AM, please contact night-coverage [email or web address] Password [facility] 12/04/2012, 8:43 AM   LOS: 1 day

## 2012-12-05 ENCOUNTER — Ambulatory Visit (INDEPENDENT_AMBULATORY_CARE_PROVIDER_SITE_OTHER): Payer: Self-pay | Admitting: General Surgery

## 2012-12-05 DIAGNOSIS — R112 Nausea with vomiting, unspecified: Secondary | ICD-10-CM

## 2012-12-05 LAB — GI PATHOGEN PANEL BY PCR, STOOL
C difficile toxin A/B: NEGATIVE
E coli (ETEC) LT/ST: NEGATIVE
E coli (STEC): NEGATIVE
E coli 0157 by PCR: NEGATIVE
Salmonella by PCR: NEGATIVE
Shigella by PCR: NEGATIVE

## 2012-12-05 LAB — CLOSTRIDIUM DIFFICILE BY PCR: Toxigenic C. Difficile by PCR: NEGATIVE

## 2012-12-05 LAB — OVA AND PARASITE EXAMINATION

## 2012-12-05 MED ORDER — DIPHENHYDRAMINE HCL 50 MG/ML IJ SOLN
INTRAMUSCULAR | Status: AC
  Filled 2012-12-05: qty 1

## 2012-12-05 NOTE — Clinical Social Work Psych Assess (Signed)
Clinical Social Work Department CLINICAL SOCIAL WORK PSYCHIATRY SERVICE LINE ASSESSMENT 12/05/2012  Patient:  [name]  Account:  [id number]  Admit Date:  12/03/2012  Clinical Social Worker:  Read Drivers  Date/Time:  12/05/2012 10:11 AM Referred by:  Physician  Date referred:  12/05/2012 Reason for Referral  Behavioral Health Issues   Presenting Symptoms/Problems (In the person's/family's own words):   Psych was consulted for "Munchausen Syndrome; depression"   Abuse/Neglect/Trauma History (check all that apply)  Denies history   Abuse/Neglect/Trauma Comments:   denies   Psychiatric History (check all that apply)  Denies history   Psychiatric medications:  none charted or reported   Current Mental Health Hospitalizations/Previous Mental Health History:   none reported or charted   Current provider:   Pt chidren are currently receiving MH tx, but pt has no MH outpatient care set up.   Place and Date:   none   Current Medications:   Scheduled Meds Sorted by Name   for [name], [name] as of 12/05/12 1013   Legend:    Given Hold Not Given Due Canceled Entry Other Actions    Time Time (Time) Time Time Time-Action      Inactive     Active     Linked    Medications 12/05/12 12/06/12 12/07/12 12/08/12 12/09/12 12/10/12 12/11/12  enoxaparin (LOVENOX) injection 40 mg  Dose: 40 mg Freq: Every 24 hours Route: [place]  Start: 12/03/12 2200   Admin Instructions:  Pharmacy may adjust.  Do NOT expel air bubble from syringe before giving.   2200       2200       2200       2200       2200       2200       2200        lipase/protease/amylase (CREON-12/PANCREASE) capsule 2 capsule  Dose: 2 capsule Freq: 3 times daily with meals Route: PO  Start: 12/04/12 0930   0822      1200      1800       0800      1200      1800       0800      1200      1800       0800      1200      1800       0800      1200      1800        0800      1200      1800       0800      1200      1800            Continuous Meds Sorted by Name   for [name] as of 12/05/12 1013   Legend:    Given Hold Not Given Due Canceled Entry Other Actions    Time Time (Time) Time Time Time-Action      Inactive     Active     Linked    Medications 12/05/12 12/06/12 12/07/12 12/08/12 12/09/12 12/10/12 12/11/12      PRN Meds Sorted by Name   for [name] as of 12/05/12 1013   Legend:    Given Hold Not Given Due Canceled Entry Other Actions    Time Time (Time) Time Time Time-Action      Inactive  Active     Linked    Medications 12/05/12 12/06/12 12/07/12 12/08/12 12/09/12 12/10/12 12/11/12  albuterol (PROVENTIL HFA;VENTOLIN HFA) 108 (90 BASE) MCG/ACT inhaler 2 puff  Dose: 2 puff Freq: Every 6 hours PRN Route: IN  PRN Reason: wheezing  Start: 12/03/12 1818    diphenhydrAMINE (BENADRYL) 25 mg in sodium chloride 0.9 % 50 mL IVPB  Dose: 25 mg Freq: Every 6 hours PRN Route: IV  PRN Reason: itching  Start: 12/03/12 2232   Admin Instructions:  prn itching resistant to oral or if patient cannot tolerate oral diphenydrAMINE (BENADRYL).   0320      2952        diphenhydrAMINE (BENADRYL) capsule 25 mg  Dose: 25 mg Freq: Every 6 hours PRN Route: PO  PRN Reason: itching  Start: 12/03/12 1818 End: 12/03/12 2232    HYDROmorphone (DILAUDID) injection 0.5 mg  Dose: 0.5 mg Freq: Every 4 hours PRN Route: IV  PRN Reason: pain  Start: 12/03/12 1818   0013      0404      0812        promethazine (PHENERGAN) tablet 12.5 mg  Dose: 12.5 mg Freq: Every 6 hours PRN Route: PO  PRN Reason: nausea  Start: 12/03/12 1818    Medications 12/05/12 12/06/12 12/07/12 12/08/12 12/09/12 12/10/12 12/11/12   Previous Impatient Admission/Date/Reason:   none reported or charted   Emotional Health / Current Symptoms    Suicide/Self Harm  None reported   Suicide attempt in the past:   none reported or  charted   Other harmful behavior:   none reported or charted   Psychotic/Dissociative Symptoms  None reported   Other Psychotic/Dissociative Symptoms:   none reported or charted     Other Attention / Behavioral Symptoms:   none    Cognitive Impairment  Orientation - Place  Orientation - Self  Orientation - Situation  Orientation - Time  Within Normal Limits   Other Cognitive Impairment:    Mood and Adjustment  Mood Congruent  DEPRESSION    Stress, Anxiety, Trauma, Any Recent Loss/Stressor  None reported   Anxiety (frequency):   none reported or charted   Phobia (specify):   none reported or charted   Compulsive behavior (specify):   none reported or charted   Obsessive behavior (specify):   none reported or charted   Other:   none charted or reported   Substance Abuse/Use  None   SBIRT completed (please refer for detailed history):  N  Self-reported substance use:   none reported or charted  untested   Urinary Drug Screen Completed:  N Alcohol level:   untested    Environmental/Housing/Living Arrangement  Stable housing   Who is in the home:   Mother and children   Emergency contact:  Mother, Luetta Nutting [phone]   Financial  Medicaid   Patient's Strengths and Goals (patient's own words):   Pt is complaint with medical advice and has strong support from her mother, Luetta Nutting who remains at bedside.   Clinical Social Worker's Interpretive Summary:   Pt is alert and oriented x4.  Pt has 3 children who require a lot of attention re: their ADD/ADHD dx.  Pt was assessed by psychiatry on 06/02 4:39PM.  Psychiatry is signing off. No psych needs identified.  Psych CSW is signing off as well.   Disposition:  Psych Clinical Social Worker signing off  Vickii Penna, [place] [phone]  Clinical Social Work

## 2012-12-05 NOTE — Progress Notes (Signed)
[facility] PROGRESS NOTE  SHRAVYA WICKWIRE ZOX:[id number] DOB: 01-Sep-1980 DOA: 12/03/2012 PCP: Ignacia Bayley, MD  Assessment/Plan:  Christel Mormon  -Suspect alcohol/exogenous substance ingestion--pt NOT forthcoming -Patient has documented history of Munchausen's syndrome  -LFTs trending down but refused labs on 12/05/12 -Continue to trend LFTs  -Discussed case with GI--> Avoid any furthery workup  -Patient has had extensive workup for multiple GI doctors including at [facility]  -Symptomatic management  -Followup viral hepatitis serologies--neg  -Urine drug screen--neg  -Acetaminophen level undetectable  -12/03/2012 abdominal ultrasound--fatty liver, CBD 7mm  Chronic abdominal pain  -Long discussion with the patient today--I have told her that I would not be giving her any further opioid medications  -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 -Patient has documented history of drug seeking behavior with prescriptions from numerous providers  -Trial of pancreatic enzyme replacement  Chronic pancreatitis/Pancreatic Divisum -Start Creon -pt tolerating diet---will always have abdominal pain with food--no vomiting x 48 hrs -have asked pt to tell nursing staff of any vomitus -Patient had pancreatic duct stent removed 09/19/2012 at [facility]  Major depression/Munchausen syndrome  -Consulted Psychiatry--no new recommendations Intractable vomiting and diarrhea  -Patient has not had any vomiting or loose stools x nearly 48 hrs -I have asked the patient to notify the nursing staff each time she has any emesis or diarrhea  -C. difficile PCRnegative -Continue IV fluids  -Case was discussed with Dr. Dulce Sellar  Tobacco abuse  -Tobacco cessation discussed  Family Communication: brother at beside  Disposition Plan:   Home when stool pathogen panel results return      Procedures/Studies: US Abdomen Complete  12/03/2012   *RADIOLOGY REPORT*  Clinical Data:  Abdominal pain, elevated  LFTs, prior cholecystectomy  COMPLETE ABDOMINAL ULTRASOUND  Comparison:  CT abdomen pelvis dated 09/09/2012  Findings:  Gallbladder:  Surgically absent.  Common bile duct:  Measures 7 mm.  Liver:  No focal lesion identified.  Hyperechoic hepatic parenchyma, possibly reflecting mild hepatic steatosis.  Mild intrahepatic ductal prominence, unchanged from prior CT, likely postsurgical.  IVC:  Appears normal.  Pancreas:  Incompletely visualized but grossly unremarkable.  Spleen:  Measures 4.3 cm.  Right Kidney:  Measures 11.2 cm.  11 x 11 x 12 mm interpolar cyst. No hydronephrosis.  Left Kidney:  Measures 11.0 cm.  No mass or hydronephrosis  Abdominal aorta:  No aneurysm identified.  IMPRESSION: Possible mild hepatic steatosis.  Status post cholecystectomy.  Stable mild intrahepatic ductal prominence, likely postsurgical.  Common duct measures 7 mm, within normal limits for postsurgical appearance.   Original Report Authenticated By: Charline Bills, M.D.   Dg Abd 2 Views  12/03/2012   *RADIOLOGY REPORT*  Clinical Data: Left abdominal pain  ABDOMEN - 2 VIEW  Comparison: 10/31/2012  Findings: No free air.  Vascular clips in the right upper abdomen. Small bowel decompressed.  Moderate fecal material in the proximal colon, decompressed distally.  No abnormal abdominal calcifications.  Regional bones unremarkable.  IMPRESSION:  Nonobstructive bowel gas pattern with moderate proximal colonic fecal material.   Original Report Authenticated By: D. Andria Rhein, MD         Subjective: Patient has not had any vomiting for 48 hours patient has not had loose stool for over 24 hours. Still complains of abdominal pain with meals. Denies any fevers, chills, chest pain, shortness of breath, fevers, chills  Objective: Filed Vitals:   12/04/12 1300 12/04/12 2201 12/05/12 0632 12/05/12 1654  BP: 124/76 107/63 101/55 115/58  Pulse: 97 85 77 85  Temp: 98.2 F (36.8 C) 98.1 F (36.7 C) 98.2 F (36.8 C) 98.5 F (36.9 C)   TempSrc: Oral Oral Oral Oral  Resp: 18 18 18 18   Height:      Weight:      SpO2: 100% 98% 99% 98%    Intake/Output Summary (Last 24 hours) at 12/05/12 1814 Last data filed at 12/05/12 0500  Gross per 24 hour  Intake    370 ml  Output      0 ml  Net    370 ml   Weight change:  Exam:   General:  Pt is alert, follows commands appropriately, not in acute distress  HEENT: No icterus, No thrush,  [place]/AT  Cardiovascular: RRR, S1/S2, no rubs, no gallops  Respiratory: CTA bilaterally, no wheezing, no crackles, no rhonchi  Abdomen: Soft/+BS, epigastric tenderness, non distended, no guarding  Extremities: No edema, No lymphangitis, No petechiae, No rashes, no synovitis  Data Reviewed: Basic Metabolic Panel:  Recent Labs Lab 12/03/12 0439 12/03/12 0730 12/03/12 0854  NA 137 139 140  K 3.6 4.0 4.1  CL 103 108 108  CO2 25 21 23   GLUCOSE 100* 97 92  BUN 17 15 14   CREATININE 0.69 0.60 0.65  CALCIUM 9.1 8.6 8.6   Liver Function Tests:  Recent Labs Lab 12/03/12 0439 12/03/12 0730 12/03/12 0854 12/04/12 0540  AST 526* 661* 576* 151*  ALT 291* 395* 386* 228*  ALKPHOS 107 101 100 100  BILITOT 0.5 0.5 0.4 0.4  PROT 7.9 7.6 7.5 6.9  ALBUMIN 4.0 3.7 3.6 3.2*    Recent Labs Lab 12/03/12 0439 12/03/12 0730 12/03/12 0854  LIPASE 50 38 37   No results found for this basename: AMMONIA,  in the last 168 hours CBC:  Recent Labs Lab 12/03/12 0439  WBC 6.6  NEUTROABS 3.0  HGB 12.3  HCT 35.3*  MCV 80.2  PLT 197   Cardiac Enzymes: No results found for this basename: CKTOTAL, CKMB, CKMBINDEX, TROPONINI,  in the last 168 hours BNP: No components found with this basename: POCBNP,  CBG: No results found for this basename: GLUCAP,  in the last 168 hours  Recent Results (from the past 240 hour(s))  CLOSTRIDIUM DIFFICILE BY PCR     Status: None   Collection Time    12/04/12  6:04 PM      Result Value Range Status   C difficile by pcr NEGATIVE  NEGATIVE Final   STOOL CULTURE     Status: None   Collection Time    12/04/12  6:04 PM      Result Value Range Status   Specimen Description STOOL   Final   Special Requests NONE   Final   Culture Culture reincubated for better growth   Final   Report Status PENDING   Incomplete  OVA AND PARASITE EXAMINATION     Status: None   Collection Time    12/04/12  6:04 PM      Result Value Range Status   Specimen Description STOOL   Final   Special Requests NONE   Final   Ova and parasites NO OVA OR PARASITES SEEN MODERATE YEAST   Final   Report Status 12/05/2012 FINAL   Final     Scheduled Meds: . enoxaparin (LOVENOX) injection  40 mg Subcutaneous Q24H  . lipase/protease/amylase  2 capsule Oral TID WC   Continuous Infusions:    Venia Riveron, DO  [facility] Pager [phone]  If 7PM-7AM, please contact night-coverage [email or web address]  Password [email or web address] 12/05/2012, 6:14 PM   LOS: 2 days

## 2012-12-05 NOTE — Progress Notes (Signed)
0120 [name] - Cheree Ditto NP notified of patient unable to get pain relief. [name] stated that she could not have any more narcotics than is order. Told patient what the NP stated. Will continue to monitor.

## 2012-12-05 NOTE — Progress Notes (Signed)
Subjective: Reports several episodes of diarrhea last night. Continues to endorse abdominal pain.  Objective: Vital signs in last 24 hours: Temp:  [98.1 F (36.7 C)-98.2 F (36.8 C)] 98.2 F (36.8 C) (06/03 1610) Pulse Rate:  [77-97] 77 (06/03 0632) Resp:  [18] 18 (06/03 0632) BP: (101-124)/(55-76) 101/55 mmHg (06/03 0632) SpO2:  [98 %-100 %] 99 % (06/03 9604) Weight change:  Last BM Date: 12/04/12  PE: GEN:  Flat affect, appears comfortable and in NAD ABD:  Mild generalized tenderness (decreases with distraction); no peritonitis  Assessment:  1.  Chronic abdominal pain.  Likely components of chronic pancreatitis, but patient has pain-seeking behaviors as well. 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  Extensive evaluation here and elsewhere. 2.  Elevated LFTs, downtrending, suspect alcohol and non-alcohol mediated steatosis. 3.  Pancreas divisum, with recent minor papillotomy and pancreatic duct stent placement (and subsequent removal). 4.  Diarrhea.  Reports 4-5 episodes last night, but no nursing documentation to corroborate.  Plan:  1.  Continue trial of pancreatic enzymes.  Advance diet. 2.  Awaiting stool studies. 3.  Minimize narcotics as clinically feasible. 4.  There is no role for further GI tract testing at this time.  5.   Patient would benefit from Pain Management evaluation, inpatient versus outpatient. 6.  I have nothing left to offer; will sign-off; please call with questions.  [name] 12/05/2012, 8:09 AM

## 2012-12-06 DIAGNOSIS — Z9189 Other specified personal risk factors, not elsewhere classified: Secondary | ICD-10-CM

## 2012-12-06 MED ORDER — HYDROCODONE-ACETAMINOPHEN 5-325 MG PO TABS: 1.0000 | ORAL_TABLET | Freq: Four times a day (QID) | ORAL | Status: DC | PRN

## 2012-12-06 MED ORDER — PANCRELIPASE (LIP-PROT-AMYL) 12000-38000 UNITS PO CPEP: 2.0000 | ORAL_CAPSULE | Freq: Three times a day (TID) | ORAL | Status: DC

## 2012-12-06 NOTE — Discharge Summary (Signed)
Physician Discharge Summary  [name]:[id number] DOB: 02-15-1981 DOA: 12/03/2012  PCP: Ignacia Bayley, MD  Admit date: 12/03/2012 Discharge date: 12/06/2012  Time spent: minutes  Recommendations for Outpatient Follow-up:  1. PCP in 1 week 2. GI at [facility]   Discharge Diagnoses:    Nausea vomiting, diarrhea & abdominal pain   DEPRESSION, MAJOR, RECURRENT   MUNCHAUSEN'S SYNDROME    Hepatic steatosis   Transaminitis   GERD   PANCREATITIS, CHRONIC   CHOLECYSTECTOMY, HX OF   Nausea vomiting, diarrhea & abdominal pain   Discharge Condition: stable  Diet recommendation: bland diet advance as tolerated   Filed Weights   12/03/12 1519  Weight: 77.111 kg (170 lb)    History of present illness:  essica L Lee is a 32 y.o. female with PMH of chronic pancreatitis, status post laparoscopic cholecystectomy, chronic abdominal pain, possible drug-seeking behavior, documented Munchausen's syndrome, multiple listed medication allergies, several ED visits (48 times in the last 6 months to the [facility]), seeks care at other hospitals [facility]) presented to the [facility] for the second time in 12 hours with complaints of abdominal pain, nausea, vomiting and diarrhea. She claims she started having above symptoms approximately 3 weeks ago. She describes upper abdominal pain, constant, sharp, rates it at 10/10 in severity (although she appears absolutely comfortable), worsened by by mouth intake and no true relieving factors. This has been associated with 3-4 episodes of daily emesis of "white color liquid" without coffee-ground or blood. She also reports several "10 times" daily diarrhea which is clear and yellow color without blood. She complains of feeling hot and cold. All the symptoms apparently have progressively gotten worse. She went to [facility] yesterday and then presented to [facility] ED with printout  of lab tests showing elevated lipase. She was apparently sent here for hospital admission. Repeat labs here showed significant transaminitis in the 500s but normal lipase. She was advised hospital admission but signed out AMA because she had to take care of her kids. She now returns with similar symptoms. Repeat labs are unchanged. She gives history of some form of I&D on right ear a few days back and started on Vicodin and clindamycin. She denies overdosing on Tylenol or Vicodin. She denies alcohol intake or any other new medication. 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  -Patient has documented history of drug seeking behavior with prescriptions from numerous providers  -Trial of pancreatic enzyme replacement  -weaned off IV narcotics and tolerating most of her diet by the time of discharge   Chronic pancreatitis/Pancreatic Divisum  -Started on  Creon  -pt tolerating diet---will always have abdominal pain with food--no vomiting x 48 hrs  -Patient had pancreatic duct stent removed 09/19/2012 at [facility], advised to FU with GI at [facility]  -could be related to fatty liver /NAsh improved -Suspect alcohol/exogenous substance ingestion--pt NOT forthcoming  -Patient has documented history of Munchausen's syndrome  -LFTs trending down but refused labs on 12/05/12  -Discussed case with GI--> Avoid any furthery workup  -Patient has had extensive workup for multiple GI doctors including at [facility]  -Symptomatic management  -Followup viral hepatitis serologies--neg  -Urine drug screen--neg  -Acetaminophen level undetectable  -12/03/2012 abdominal ultrasound--fatty liver, CBD 7mm   Major depression/Munchausen syndrome  -Consulted Psychiatry--no new recommendations   Intractable vomiting and  diarrhea  -Patient has not had any vomiting or loose stools x  nearly 72 hours that staff witnessed  -C. difficile PCRnegative  -supportive care only -Case was discussed with Dr. Dulce Sellar   Tobacco abuse  -Tobacco cessation discussed    Consultations:  GI-Dr.Outlaw  Discharge Exam: Filed Vitals:   12/05/12 1654 12/05/12 2115 12/06/12 0549 12/06/12 1402  BP: 115/58 127/64 99/66 108/64  Pulse: 85 77 93 82  Temp: 98.5 F (36.9 C) 98.2 F (36.8 C) 97.7 F (36.5 C) 98.3 F (36.8 C)  TempSrc: Oral Oral Oral Oral  Resp: 18 18 18 18   Height:      Weight:      SpO2: 98% 99% 98% 97%    General: AAOx3 Cardiovascular: S1S2/RRR Respiratory: CTAB  Discharge Instructions  Discharge Orders   Future Appointments Provider Department Dept Phone   12/08/2012 9:10 AM Robyne Askew, MD [facility] [place] [phone]   Future Orders Complete By Expires     Discharge instructions  As directed     Comments:      Parke Simmers diet, advance as tolerated DO not drive or operate machinery while taking narcotics    Increase activity slowly  As directed         Medication List    STOP taking these medications       clindamycin 150 MG capsule  Commonly known as:  CLEOCIN      TAKE these medications       albuterol 108 (90 BASE) MCG/ACT inhaler  Commonly known as:  PROVENTIL HFA;VENTOLIN HFA  Inhale 2 puffs into the lungs every 6 (six) hours as needed for wheezing.     HYDROcodone-acetaminophen 5-325 MG per tablet  Commonly known as:  NORCO  Take 1 tablet by mouth every 6 (six) hours as needed for pain.     lipase/protease/amylase 29562 UNITS Cpep  Commonly known as:  CREON-12/PANCREASE  Take 2 capsules by mouth 3 (three) times daily with meals.       Allergies  Allergen Reactions  . Celecoxib Anaphylaxis and Swelling  . Fentanyl Anaphylaxis and Rash  . Ibuprofen Anaphylaxis and Rash  . Ketorolac Tromethamine Anaphylaxis  . Meperidine Hcl Anaphylaxis  . Morphine Anaphylaxis and Rash  . Ondansetron Anaphylaxis  . Shellfish  Allergy Anaphylaxis    Crab and lobster  . Tramadol Anaphylaxis and Rash  . Coconut Flavor Swelling    Trouble breathing Trouble breathing  . Propoxyphene-Acetaminophen Rash  . Tylenol (Acetaminophen) Hives, Itching and Rash       Follow-up Information   Follow up with GI doctor in [place] or [place] In 2 weeks.       The results of significant diagnostics from this hospitalization (including imaging, microbiology, ancillary and laboratory) are listed below for reference.    Significant Diagnostic Studies: US Abdomen Complete  12/03/2012   *RADIOLOGY REPORT*  Clinical Data:  Abdominal pain, elevated LFTs, prior cholecystectomy  COMPLETE ABDOMINAL ULTRASOUND  Comparison:  CT abdomen pelvis dated 09/09/2012  Findings:  Gallbladder:  Surgically absent.  Common bile duct:  Measures 7 mm.  Liver:  No focal lesion identified.  Hyperechoic hepatic parenchyma, possibly reflecting mild hepatic steatosis.  Mild intrahepatic ductal prominence, unchanged from prior CT, likely postsurgical.  IVC:  Appears normal.  Pancreas:  Incompletely visualized but grossly unremarkable.  Spleen:  Measures 4.3 cm.  Right Kidney:  Measures 11.2 cm.  11 x 11 x 12 mm interpolar cyst. No hydronephrosis.  Left Kidney:  Measures 11.0 cm.  No mass or hydronephrosis  Abdominal aorta:  No aneurysm identified.  IMPRESSION: Possible mild hepatic steatosis.  Status post cholecystectomy.  Stable mild intrahepatic ductal prominence, likely postsurgical.  Common duct measures 7 mm, within normal limits for postsurgical appearance.   Original Report Authenticated By: Charline Bills, M.D.   Dg Abd 2 Views  12/03/2012   *RADIOLOGY REPORT*  Clinical Data: Left abdominal pain  ABDOMEN - 2 VIEW  Comparison: 10/31/2012  Findings: No free air.  Vascular clips in the right upper abdomen. Small bowel decompressed.  Moderate fecal material in the proximal colon, decompressed distally.  No abnormal abdominal calcifications.  Regional bones  unremarkable.  IMPRESSION:  Nonobstructive bowel gas pattern with moderate proximal colonic fecal material.   Original Report Authenticated By: D. Andria Rhein, MD    Microbiology: Recent Results (from the past 240 hour(s))  CLOSTRIDIUM DIFFICILE BY PCR     Status: None   Collection Time    12/04/12  6:04 PM      Result Value Range Status   C difficile by pcr NEGATIVE  NEGATIVE Final  STOOL CULTURE     Status: None   Collection Time    12/04/12  6:04 PM      Result Value Range Status   Specimen Description STOOL   Final   Special Requests NONE   Final   Culture NO SUSPICIOUS COLONIES, CONTINUING TO HOLD   Final   Report Status PENDING   Incomplete  OVA AND PARASITE EXAMINATION     Status: None   Collection Time    12/04/12  6:04 PM      Result Value Range Status   Specimen Description STOOL   Final   Special Requests NONE   Final   Ova and parasites NO OVA OR PARASITES SEEN MODERATE YEAST   Final   Report Status 12/05/2012 FINAL   Final     Labs: Basic Metabolic Panel:  Recent Labs Lab 12/03/12 0439 12/03/12 0730 12/03/12 0854  NA 137 139 140  K 3.6 4.0 4.1  CL 103 108 108  CO2 25 21 23   GLUCOSE 100* 97 92  BUN 17 15 14   CREATININE 0.69 0.60 0.65  CALCIUM 9.1 8.6 8.6   Liver Function Tests:  Recent Labs Lab 12/03/12 0439 12/03/12 0730 12/03/12 0854 12/04/12 0540  AST 526* 661* 576* 151*  ALT 291* 395* 386* 228*  ALKPHOS 107 101 100 100  BILITOT 0.5 0.5 0.4 0.4  PROT 7.9 7.6 7.5 6.9  ALBUMIN 4.0 3.7 3.6 3.2*    Recent Labs Lab 12/03/12 0439 12/03/12 0730 12/03/12 0854  LIPASE 50 38 37   No results found for this basename: AMMONIA,  in the last 168 hours CBC:  Recent Labs Lab 12/03/12 0439  WBC 6.6  NEUTROABS 3.0  HGB 12.3  HCT 35.3*  MCV 80.2  PLT 197   Cardiac Enzymes: No results found for this basename: CKTOTAL, CKMB, CKMBINDEX, TROPONINI,  in the last 168 hours BNP: BNP (last 3 results) No results found for this basename: PROBNP,   in the last 8760 hours CBG: No results found for this basename: GLUCAP,  in the last 168 hours     Signed:  Tonna Palazzi  [facility] 12/06/2012, 3:43 PM

## 2012-12-06 NOTE — Care Management Note (Signed)
  Page 1 of 1   12/06/2012     1:35:08 PM   CARE MANAGEMENT NOTE 12/06/2012  Patient:  [name], [name]   Account Number:  [id number]  Date Initiated:  12/06/2012  Documentation initiated by:  Ronny Flurry  Subjective/Objective Assessment:     Action/Plan:   Anticipated DC Date:  12/06/2012   Anticipated DC Plan:  HOME/SELF CARE         Choice offered to / List presented to:             Status of service:   Medicare Important Message given?   (If response is "NO", the following Medicare IM given date fields will be blank) Date Medicare IM given:   Date Additional Medicare IM given:    Discharge Disposition:    Per UR Regulation:    If discussed at Long Length of Stay Meetings, dates discussed:    Comments:  12-06-12 referral for PCP . List of Primary Care Resources given to [place] . [place] does have Medicaid , explained to patient she can call number on her card to be provided with a list of PCP's that are in network for her. Also provided patient with [facility] number if she wants a [place] PCP. Patient voiced understanding.  Ronny Flurry RN BSN

## 2012-12-07 ENCOUNTER — Encounter (HOSPITAL_COMMUNITY): Payer: Self-pay | Admitting: Emergency Medicine

## 2012-12-07 ENCOUNTER — Emergency Department (HOSPITAL_COMMUNITY)
Admission: EM | Admit: 2012-12-07 | Discharge: 2012-12-07 | Disposition: A | Discharge status: Home / Self Care | Payer: Medicaid Other | Attending: Emergency Medicine | Admitting: Emergency Medicine

## 2012-12-07 DIAGNOSIS — Z8742 Personal history of other diseases of the female genital tract: Secondary | ICD-10-CM | POA: Insufficient documentation

## 2012-12-07 DIAGNOSIS — G8929 Other chronic pain: Secondary | ICD-10-CM | POA: Insufficient documentation

## 2012-12-07 DIAGNOSIS — K859 Acute pancreatitis without necrosis or infection, unspecified: Secondary | ICD-10-CM | POA: Insufficient documentation

## 2012-12-07 DIAGNOSIS — R109 Unspecified abdominal pain: Secondary | ICD-10-CM

## 2012-12-07 DIAGNOSIS — R110 Nausea: Secondary | ICD-10-CM

## 2012-12-07 DIAGNOSIS — F6812 Factitious disorder with predominantly physical signs and symptoms: Secondary | ICD-10-CM | POA: Insufficient documentation

## 2012-12-07 DIAGNOSIS — Z862 Personal history of diseases of the blood and blood-forming organs and certain disorders involving the immune mechanism: Secondary | ICD-10-CM | POA: Insufficient documentation

## 2012-12-07 DIAGNOSIS — Z79899 Other long term (current) drug therapy: Secondary | ICD-10-CM | POA: Insufficient documentation

## 2012-12-07 DIAGNOSIS — Z3202 Encounter for pregnancy test, result negative: Secondary | ICD-10-CM | POA: Insufficient documentation

## 2012-12-07 DIAGNOSIS — Z9889 Other specified postprocedural states: Secondary | ICD-10-CM | POA: Insufficient documentation

## 2012-12-07 DIAGNOSIS — F17200 Nicotine dependence, unspecified, uncomplicated: Secondary | ICD-10-CM | POA: Insufficient documentation

## 2012-12-07 DIAGNOSIS — R112 Nausea with vomiting, unspecified: Secondary | ICD-10-CM | POA: Insufficient documentation

## 2012-12-07 DIAGNOSIS — R1011 Right upper quadrant pain: Secondary | ICD-10-CM | POA: Insufficient documentation

## 2012-12-07 LAB — COMPREHENSIVE METABOLIC PANEL
Albumin: 3.9 g/dL (ref 3.5–5.2)
BUN: 15 mg/dL (ref 6–23)
CO2: 24 mEq/L (ref 19–32)
Chloride: 103 mEq/L (ref 96–112)
Creatinine, Ser: 0.67 mg/dL (ref 0.50–1.10)
GFR calc non Af Amer: >90 mL/min (ref >90)
Total Bilirubin: 0.2 mg/dL — ABNORMAL LOW (ref 0.3–1.2)

## 2012-12-07 LAB — CBC WITH DIFFERENTIAL/PLATELET
Basophils Relative: 0 % (ref 0–1)
HCT: 36.4 % (ref 36.0–46.0)
Hemoglobin: 12.8 g/dL (ref 12.0–15.0)
MCHC: 35.2 g/dL (ref 30.0–36.0)
MCV: 80.5 fL (ref 78.0–100.0)
Monocytes Absolute: 0.6 10*3/uL (ref 0.1–1.0)
Monocytes Relative: 7 % (ref 3–12)
Neutro Abs: 4.1 10*3/uL (ref 1.7–7.7)

## 2012-12-07 LAB — URINALYSIS, ROUTINE W REFLEX MICROSCOPIC
Bilirubin Urine: NEGATIVE
Glucose, UA: NEGATIVE mg/dL
Hgb urine dipstick: NEGATIVE
Ketones, ur: NEGATIVE mg/dL
Protein, ur: NEGATIVE mg/dL
Urobilinogen, UA: 0.2 mg/dL (ref 0.0–1.0)

## 2012-12-07 LAB — PREGNANCY, URINE: Preg Test, Ur: NEGATIVE

## 2012-12-07 LAB — RAPID URINE DRUG SCREEN, HOSP PERFORMED
Amphetamines: NOT DETECTED
Barbiturates: NOT DETECTED
Opiates: NOT DETECTED
Tetrahydrocannabinol: NOT DETECTED

## 2012-12-07 LAB — LIPASE, BLOOD: Lipase: 35 U/L (ref 11–59)

## 2012-12-07 MED ORDER — METOCLOPRAMIDE HCL 5 MG/ML IJ SOLN
10.0000 mg | Freq: Once | INTRAMUSCULAR | Status: AC
  Administered 2012-12-07: 10 mg via INTRAVENOUS
  Filled 2012-12-07: qty 2

## 2012-12-07 MED ORDER — SODIUM CHLORIDE 0.9 % IV BOLUS (SEPSIS)
2000.0000 mL | Freq: Once | INTRAVENOUS | Status: AC
  Administered 2012-12-07: 2000 mL via INTRAVENOUS

## 2012-12-07 MED ORDER — DIPHENHYDRAMINE HCL 50 MG/ML IJ SOLN
25.0000 mg | Freq: Once | INTRAMUSCULAR | Status: AC
  Administered 2012-12-07: 25 mg via INTRAVENOUS
  Filled 2012-12-07: qty 1

## 2012-12-07 MED ORDER — HYDROMORPHONE HCL PF 1 MG/ML IJ SOLN
1.0000 mg | Freq: Once | INTRAMUSCULAR | Status: AC
  Administered 2012-12-07: 1 mg via INTRAVENOUS
  Filled 2012-12-07: qty 1

## 2012-12-07 MED ORDER — DIPHENHYDRAMINE HCL 50 MG/ML IJ SOLN
50.0000 mg | Freq: Once | INTRAMUSCULAR | Status: AC
  Administered 2012-12-07: 50 mg via INTRAVENOUS
  Filled 2012-12-07 (×2): qty 1

## 2012-12-07 NOTE — ED Notes (Signed)
PT. REPORTS CHRONIC GENERALIZED ABDOMINAL PAIN WITH NAUSEA, VOMITTING AND DIARRHEA , PT. STATED THAT SHE WAS JUST DISCHARGED FROM HOSPITAL THIS AFTERNOON .

## 2012-12-07 NOTE — ED Provider Notes (Signed)
History     CSN: [id number]  Arrival date & time 12/07/12  0034   First MD Initiated Contact with Patient 12/07/12 0045      Chief Complaint  Patient presents with  . Abdominal Pain    (Consider location/radiation/quality/duration/timing/severity/associated sxs/prior treatment) The history is provided by the patient.  [name] is a 32 y.o. female history of chronic abdominal pain, Munchausen syndrome, pancreatic stent here with worsening R sided abdominal pain. She was admitted and discharged today. During the hospitalization, she has abdominal US that showed normal cholecystectomy post op changes. She also had elevated LFTs likely secondary to drug use and further GI workup was deferred. She was able to tolerate PO on discharge but afterwards had some vomiting. No fevers. Has some RUQ pain as well. Denies urinary symptoms.    Past Medical History  Diagnosis Date  . Pancreatitis   . S/P laparoscopic cholecystectomy   . Fibroids   . Ovarian cyst   . Chronic abdominal pain   . Anemia     Past Surgical History  Procedure Laterality Date  . Cholecystectomy    . Dilation and curettage of uterus    . Ercp w/ metal stent placement      Family History  Problem Relation Age of Onset  . Diabetes Mother   . Hypertension Mother   . Hypertension Father   . Diabetes Father   . Asthma Daughter     History  Substance Use Topics  . Smoking status: Current Every Day Smoker -- 0.25 packs/day for 10 years    Types: Cigarettes    Last Attempt to Quit: 09/01/2012  . Smokeless tobacco: Never Used  . Alcohol Use: No    OB History   Grav Para Term Preterm Abortions TAB SAB Ect Mult Living   4 2 1 1 2  2          Review of Systems  Gastrointestinal: Positive for vomiting and abdominal pain.  All other systems reviewed and are negative.    Allergies  Celecoxib; Fentanyl; Ibuprofen; Ketorolac tromethamine; Meperidine hcl; Morphine; Ondansetron; Shellfish allergy; Tramadol;  Coconut flavor; Propoxyphene-acetaminophen; and Tylenol  Home Medications   Current Outpatient Rx  Name  Route  Sig  Dispense  Refill  . albuterol (PROVENTIL HFA;VENTOLIN HFA) 108 (90 BASE) MCG/ACT inhaler   Inhalation   Inhale 2 puffs into the lungs every 6 (six) hours as needed for wheezing.         Marland Kitchen HYDROcodone-acetaminophen (NORCO) 5-325 MG per tablet   Oral   Take 1 tablet by mouth every 6 (six) hours as needed for pain.   20 tablet   0   . HYDROmorphone (DILAUDID) 2 MG tablet   Oral   Take 2 mg by mouth every 6 (six) hours as needed for pain.         Marland Kitchen lipase/protease/amylase (CREON-12/PANCREASE) 12000 UNITS CPEP   Oral   Take 2 capsules by mouth 3 (three) times daily with meals.   270 capsule   0     BP 126/98  Pulse 82  Temp(Src) 98.4 F (36.9 C) (Oral)  Resp 14  SpO2 99%  LMP 11/26/2012  Physical Exam  Nursing note and vitals reviewed. Constitutional: She is oriented to person, place, and time. She appears well-developed and well-nourished.  Slightly uncomfortable   HENT:  Head: Normocephalic.  Mouth/Throat: Oropharynx is clear and moist.  Eyes: Conjunctivae are normal. Pupils are equal, round, and reactive to light.  Neck: Normal range  of motion. Neck supple.  Cardiovascular: Normal rate, regular rhythm and normal heart sounds.   Pulmonary/Chest: Effort normal and breath sounds normal. No respiratory distress. She has no wheezes. She has no rales.  Abdominal: Soft.  Mild RUQ tenderness, no rebound. No murphy's sign. No CVAT   Musculoskeletal: Normal range of motion.  Neurological: She is alert and oriented to person, place, and time.  Skin: Skin is warm and dry.  Psychiatric: She has a normal mood and affect. Her behavior is normal. Judgment and thought content normal.    ED Course  Procedures (including critical care time)  Labs Reviewed  CBC WITH DIFFERENTIAL - Abnormal; Notable for the following:    Eosinophils Relative 8 (*)    All other  components within normal limits  COMPREHENSIVE METABOLIC PANEL - Abnormal; Notable for the following:    ALT 101 (*)    Total Bilirubin 0.2 (*)    All other components within normal limits  URINALYSIS, ROUTINE W REFLEX MICROSCOPIC - Abnormal; Notable for the following:    APPearance CLOUDY (*)    All other components within normal limits  LIPASE, BLOOD  URINE RAPID DRUG SCREEN (HOSP PERFORMED)  PREGNANCY, URINE   No results found.   No diagnosis found.    MDM  [name] is a 32 y.o. female here with abdominal pain, vomiting. Likely chronic abdominal pain. Will check LFTs to make sure they are stable. Will hydrate and give pain meds and reassess.   4:16 AM LFTs now trended down from previous. Felt better after meds. Will d/c home on reglan. She has pain medication prescription at home.         Richardean Canal, MD 12/07/12 [phone]

## 2012-12-07 NOTE — ED Notes (Signed)
IV Nurses at bedside attempting iv access on pt.

## 2012-12-08 ENCOUNTER — Ambulatory Visit (INDEPENDENT_AMBULATORY_CARE_PROVIDER_SITE_OTHER): Payer: Self-pay | Admitting: General Surgery

## 2012-12-08 LAB — HEPATITIS PANEL, ACUTE
HCV Ab: NEGATIVE
Hep A IgM: UNDETERMINED — AB
Hep B C IgM: UNDETERMINED — AB
Hepatitis B Surface Ag: NEGATIVE

## 2012-12-08 LAB — STOOL CULTURE

## 2012-12-09 ENCOUNTER — Emergency Department (HOSPITAL_COMMUNITY)
Admission: EM | Admit: 2012-12-09 | Discharge: 2012-12-09 | Disposition: A | Discharge status: Home / Self Care | Payer: Medicaid Other | Source: Home / Self Care | Attending: Emergency Medicine | Admitting: Emergency Medicine

## 2012-12-09 ENCOUNTER — Encounter (HOSPITAL_COMMUNITY): Payer: Self-pay

## 2012-12-09 ENCOUNTER — Emergency Department (HOSPITAL_COMMUNITY)
Admission: EM | Admit: 2012-12-09 | Discharge: 2012-12-09 | Disposition: A | Discharge status: Home / Self Care | Payer: Medicaid Other | Attending: Emergency Medicine | Admitting: Emergency Medicine

## 2012-12-09 ENCOUNTER — Encounter (HOSPITAL_COMMUNITY): Payer: Self-pay | Admitting: Emergency Medicine

## 2012-12-09 DIAGNOSIS — Z8719 Personal history of other diseases of the digestive system: Secondary | ICD-10-CM | POA: Insufficient documentation

## 2012-12-09 DIAGNOSIS — G8929 Other chronic pain: Secondary | ICD-10-CM | POA: Insufficient documentation

## 2012-12-09 DIAGNOSIS — F17200 Nicotine dependence, unspecified, uncomplicated: Secondary | ICD-10-CM | POA: Insufficient documentation

## 2012-12-09 DIAGNOSIS — R109 Unspecified abdominal pain: Secondary | ICD-10-CM

## 2012-12-09 DIAGNOSIS — Z9089 Acquired absence of other organs: Secondary | ICD-10-CM | POA: Insufficient documentation

## 2012-12-09 DIAGNOSIS — R1110 Vomiting, unspecified: Secondary | ICD-10-CM

## 2012-12-09 DIAGNOSIS — Z79899 Other long term (current) drug therapy: Secondary | ICD-10-CM | POA: Insufficient documentation

## 2012-12-09 DIAGNOSIS — Z862 Personal history of diseases of the blood and blood-forming organs and certain disorders involving the immune mechanism: Secondary | ICD-10-CM | POA: Insufficient documentation

## 2012-12-09 DIAGNOSIS — Z8742 Personal history of other diseases of the female genital tract: Secondary | ICD-10-CM | POA: Insufficient documentation

## 2012-12-09 DIAGNOSIS — R112 Nausea with vomiting, unspecified: Secondary | ICD-10-CM | POA: Insufficient documentation

## 2012-12-09 DIAGNOSIS — R197 Diarrhea, unspecified: Secondary | ICD-10-CM | POA: Insufficient documentation

## 2012-12-09 DIAGNOSIS — R1012 Left upper quadrant pain: Secondary | ICD-10-CM | POA: Insufficient documentation

## 2012-12-09 MED ORDER — PROMETHAZINE HCL 25 MG/ML IJ SOLN
12.5000 mg | Freq: Once | INTRAMUSCULAR | Status: DC
  Filled 2012-12-09: qty 1

## 2012-12-09 NOTE — ED Notes (Signed)
Pt presents with abdominal pain that she says has been going on for 4 weeks. Pt was recently admitted at [facility] and d/c 2 days ago. Pt went to [facility] about one hour ago but said there was a wait and she did not want to stay. Pt has abdominal pain and has been having vomiting and diarrhea for the past 2 days.

## 2012-12-09 NOTE — ED Notes (Signed)
Pt presents to ED with LUQ abdominal pain.  Hx of pancreatitis.  +N/V/D.

## 2012-12-09 NOTE — ED Notes (Signed)
Patient is alert and oriented x3.  She was given DC instructions and follow up visit instructions.  Patient gave verbal understanding. She was DC ambulatory under her own power to home.  V/S stable.  He was not showing any signs of distress on DC 

## 2012-12-09 NOTE — ED Notes (Signed)
Patient is alert and oriented x3.  She is complaining of mid upper abdominal pain that she has chronically been dealing with for months.  Currently she rates her pain 10 of 10.

## 2012-12-09 NOTE — ED Notes (Signed)
Writer spoke with Elliot Gurney, RN charge at [facility] ED, confirms Dr. Bebe Shaggy did evaluate and discharge patient from ED.

## 2012-12-09 NOTE — ED Provider Notes (Signed)
History     CSN: [id number]  Arrival date & time 12/09/12  0220   First MD Initiated Contact with Patient 12/09/12 0226      Chief Complaint  Patient presents with  . Abdominal Pain     Patient is a 32 y.o. female presenting with abdominal pain. The history is provided by the patient.  Abdominal Pain This is a chronic problem. The current episode started more than 1 week ago. The problem occurs daily. The problem has been gradually worsening. Associated symptoms include abdominal pain. Pertinent negatives include no chest pain and no shortness of breath. Nothing aggravates the symptoms. Nothing relieves the symptoms.    Past Medical History  Diagnosis Date  . Pancreatitis   . S/P laparoscopic cholecystectomy   . Fibroids   . Ovarian cyst   . Chronic abdominal pain   . Anemia     Past Surgical History  Procedure Laterality Date  . Cholecystectomy    . Dilation and curettage of uterus    . Ercp w/ metal stent placement      Family History  Problem Relation Age of Onset  . Diabetes Mother   . Hypertension Mother   . Hypertension Father   . Diabetes Father   . Asthma Daughter     History  Substance Use Topics  . Smoking status: Current Every Day Smoker -- 0.25 packs/day for 10 years    Types: Cigarettes    Last Attempt to Quit: 09/01/2012  . Smokeless tobacco: Never Used  . Alcohol Use: No    OB History   Grav Para Term Preterm Abortions TAB SAB Ect Mult Living   4 2 1 1 2  2          Review of Systems  Constitutional: Negative for fever.  Respiratory: Negative for cough and shortness of breath.   Cardiovascular: Negative for chest pain.  Gastrointestinal: Positive for nausea, vomiting, abdominal pain and diarrhea.  Genitourinary: Negative for dysuria.  Musculoskeletal: Negative for back pain.    Allergies  Celecoxib; Fentanyl; Ibuprofen; Ketorolac tromethamine; Meperidine hcl; Morphine; Ondansetron; Shellfish allergy; Tramadol; Coconut flavor;  Propoxyphene-acetaminophen; and Tylenol  Home Medications   Current Outpatient Rx  Name  Route  Sig  Dispense  Refill  . albuterol (PROVENTIL HFA;VENTOLIN HFA) 108 (90 BASE) MCG/ACT inhaler   Inhalation   Inhale 2 puffs into the lungs every 6 (six) hours as needed for wheezing.         Marland Kitchen HYDROcodone-acetaminophen (NORCO) 5-325 MG per tablet   Oral   Take 1 tablet by mouth every 6 (six) hours as needed for pain.   20 tablet   0   . HYDROmorphone (DILAUDID) 2 MG tablet   Oral   Take 2 mg by mouth every 6 (six) hours as needed for pain.         Marland Kitchen lipase/protease/amylase (CREON-12/PANCREASE) 12000 UNITS CPEP   Oral   Take 2 capsules by mouth 3 (three) times daily with meals.   270 capsule   0     BP 121/76  Pulse 91  Temp(Src) 97.4 F (36.3 C) (Oral)  Resp 18  SpO2 100%  LMP 11/26/2012  Physical Exam CONSTITUTIONAL: Well developed/well nourished HEAD: Normocephalic/atraumatic EYES: EOMI/PERRL, no icterus ENMT: Mucous membranes moist NECK: supple no meningeal signs SPINE:entire spine nontender CV: S1/S2 noted, no murmurs/rubs/gallops noted LUNGS: Lungs are clear to auscultation bilaterally, no apparent distress ABDOMEN: soft, nontender, no rebound or guarding, +BS noted GU:no cva tenderness NEURO: Pt is awake/alert,  moves all extremitiesx4 Pt is ambulatory.  No distress noted.   EXTREMITIES: pulses normal, full ROM SKIN: warm, color normal PSYCH: no abnormalities of mood noted  ED Course  Procedures   1. Abdominal pain   2. Vomiting and diarrhea      Pt presents for chronic abdominal pain She is in no distress, walking around the ED Labs from ER visit on 12/07/12 reviewed  Do not feel further imaging/labs necessary at this time This is a chronic problem, advised f/u as outpatient    MDM  Nursing notes including past medical history and social history reviewed and considered in documentation Previous records reviewed and  considered         Joya Gaskins, MD 12/09/12 [phone]

## 2012-12-09 NOTE — ED Provider Notes (Signed)
History     CSN: [id number]  Arrival date & time 12/09/12  9147   First MD Initiated Contact with Patient 12/09/12 0350      Chief Complaint  Patient presents with  . Abdominal Pain    (Consider location/radiation/quality/duration/timing/severity/associated sxs/prior treatment) HPI 32 year old female presents to the emergency department with complaint of upper abdominal pain, nausea, vomiting, and diarrhea.  Patient has these issues chronically.  She was just seen at the [facility] emergency department and discharged from there.  Patient with recent admission with elevated LFTs, told she had been in labor.  She saw her primary care Dr. on Thursday, who was trying to arrange followup with surgery.  She denies any fever or chills.  Pain is at her baseline.  She reports she is unable to keep her medications down.  She reports that she is unable to keep her failure suppositories in as she is having diarrhea Past Medical History  Diagnosis Date  . Pancreatitis   . S/P laparoscopic cholecystectomy   . Fibroids   . Ovarian cyst   . Chronic abdominal pain   . Anemia     Past Surgical History  Procedure Laterality Date  . Cholecystectomy    . Dilation and curettage of uterus    . Ercp w/ metal stent placement      Family History  Problem Relation Age of Onset  . Diabetes Mother   . Hypertension Mother   . Hypertension Father   . Diabetes Father   . Asthma Daughter     History  Substance Use Topics  . Smoking status: Current Every Day Smoker -- 0.25 packs/day for 10 years    Types: Cigarettes    Last Attempt to Quit: 09/01/2012  . Smokeless tobacco: Never Used  . Alcohol Use: No    OB History   Grav Para Term Preterm Abortions TAB SAB Ect Mult Living   4 2 1 1 2  2          Review of Systems  All other systems reviewed and are negative.    Allergies  Celecoxib; Fentanyl; Ibuprofen; Ketorolac tromethamine; Meperidine hcl; Morphine; Ondansetron; Shellfish allergy;  Tramadol; Coconut flavor; Propoxyphene-acetaminophen; and Tylenol  Home Medications   Current Outpatient Rx  Name  Route  Sig  Dispense  Refill  . albuterol (PROVENTIL HFA;VENTOLIN HFA) 108 (90 BASE) MCG/ACT inhaler   Inhalation   Inhale 2 puffs into the lungs every 6 (six) hours as needed for wheezing.         Marland Kitchen HYDROcodone-acetaminophen (NORCO) 5-325 MG per tablet   Oral   Take 1 tablet by mouth every 6 (six) hours as needed for pain.   20 tablet   0   . HYDROmorphone (DILAUDID) 2 MG tablet   Oral   Take 2 mg by mouth every 6 (six) hours as needed for pain.         Marland Kitchen lipase/protease/amylase (CREON-12/PANCREASE) 12000 UNITS CPEP   Oral   Take 2 capsules by mouth 3 (three) times daily with meals.   270 capsule   0     BP 132/78  Pulse 84  Temp(Src) 98.3 F (36.8 C) (Oral)  Resp 16  SpO2 99%  LMP 11/26/2012  Physical Exam  Nursing note and vitals reviewed. Constitutional: She is oriented to person, place, and time. She appears well-developed and well-nourished. No distress.  HENT:  Head: Normocephalic and atraumatic.  Nose: Nose normal.  Mouth/Throat: Oropharynx is clear and moist.  Eyes: Conjunctivae and  EOM are normal. Pupils are equal, round, and reactive to light.  Neck: Normal range of motion. Neck supple. No JVD present. No tracheal deviation present. No thyromegaly present.  Cardiovascular: Normal rate, regular rhythm, normal heart sounds and intact distal pulses.  Exam reveals no gallop and no friction rub.   No murmur heard. Pulmonary/Chest: Effort normal and breath sounds normal. No stridor. No respiratory distress. She has no wheezes. She has no rales. She exhibits no tenderness.  Abdominal: Soft. Bowel sounds are normal. She exhibits no distension and no mass. There is tenderness (mild upper abdominal pain). There is no rebound and no guarding.  Musculoskeletal: Normal range of motion. She exhibits no edema and no tenderness.  Lymphadenopathy:    She  has no cervical adenopathy.  Neurological: She is alert and oriented to person, place, and time. She exhibits normal muscle tone. Coordination normal.  Skin: Skin is warm and dry. No rash noted. No erythema. No pallor.  Psychiatric: She has a normal mood and affect. Her behavior is normal. Judgment and thought content normal.    ED Course  Procedures (including critical care time)  Labs Reviewed - No data to display No results found.   1. Nausea vomiting, diarrhea & abdominal pain   2. Chronic abdominal pain       MDM  Ms. Deiss presents with her chronic abdominal pain, associated with nausea, vomiting, and diarrhea.  Patient advised she can use her Phenergan suppositories intravaginally.  Will give IM, Phenergan here, and discharge home.        Olivia Mackie, MD 12/09/12 [phone]

## 2012-12-12 ENCOUNTER — Encounter (HOSPITAL_COMMUNITY): Payer: Self-pay | Admitting: Emergency Medicine

## 2012-12-12 ENCOUNTER — Emergency Department (HOSPITAL_COMMUNITY)
Admission: EM | Admit: 2012-12-12 | Discharge: 2012-12-12 | Disposition: A | Discharge status: Home / Self Care | Payer: Medicaid Other | Attending: Emergency Medicine | Admitting: Emergency Medicine

## 2012-12-12 DIAGNOSIS — R197 Diarrhea, unspecified: Secondary | ICD-10-CM | POA: Insufficient documentation

## 2012-12-12 DIAGNOSIS — G8929 Other chronic pain: Secondary | ICD-10-CM | POA: Insufficient documentation

## 2012-12-12 DIAGNOSIS — R109 Unspecified abdominal pain: Secondary | ICD-10-CM | POA: Insufficient documentation

## 2012-12-12 DIAGNOSIS — Z862 Personal history of diseases of the blood and blood-forming organs and certain disorders involving the immune mechanism: Secondary | ICD-10-CM | POA: Insufficient documentation

## 2012-12-12 DIAGNOSIS — Z79899 Other long term (current) drug therapy: Secondary | ICD-10-CM | POA: Insufficient documentation

## 2012-12-12 DIAGNOSIS — F17200 Nicotine dependence, unspecified, uncomplicated: Secondary | ICD-10-CM | POA: Insufficient documentation

## 2012-12-12 DIAGNOSIS — Z8719 Personal history of other diseases of the digestive system: Secondary | ICD-10-CM | POA: Insufficient documentation

## 2012-12-12 DIAGNOSIS — Z8742 Personal history of other diseases of the female genital tract: Secondary | ICD-10-CM | POA: Insufficient documentation

## 2012-12-12 DIAGNOSIS — R112 Nausea with vomiting, unspecified: Secondary | ICD-10-CM | POA: Insufficient documentation

## 2012-12-12 MED ORDER — PROMETHAZINE HCL 25 MG/ML IJ SOLN: 25.0000 mg | Freq: Once | INTRAMUSCULAR | Status: DC

## 2012-12-12 MED ORDER — LOPERAMIDE HCL 2 MG PO CAPS: 4.0000 mg | ORAL_CAPSULE | Freq: Once | ORAL | Status: DC

## 2012-12-12 MED ORDER — LOPERAMIDE HCL 2 MG PO CAPS: 2.0000 mg | ORAL_CAPSULE | Freq: Four times a day (QID) | ORAL | Status: DC | PRN

## 2012-12-12 NOTE — ED Provider Notes (Signed)
History     CSN: [id number]  Arrival date & time 12/12/12  0036   First MD Initiated Contact with Patient 12/12/12 0241      Chief Complaint  Patient presents with  . Abdominal Pain    (Consider location/radiation/quality/duration/timing/severity/associated sxs/prior treatment) HPI 32 year old female presents to the emergency department complaining of chronic nausea, vomiting, diarrhea, and upper abdominal pain.  Patient was seen by me 3 days ago for same.  She's been seen a total of 50 times in the ER in the last 6 months.  Patient reports that she is working with her primary care doctor, as well as general surgery for a definitive treatment of her pain.  She, reports she's been told that "there is something with my appendix".  Records reviewed, last CT in April shows normal appendix.  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  Patient has been laying comfortably in the bed with both her boyfriend and children.  No vomiting or diarrhea has been noted. Past Medical History  Diagnosis Date  . Pancreatitis   . S/P laparoscopic cholecystectomy   . Fibroids   . Ovarian cyst   . Chronic abdominal pain   . Anemia     Past Surgical History  Procedure Laterality Date  . Cholecystectomy    . Dilation and curettage of uterus    . Ercp w/ metal stent placement      Family History  Problem Relation Age of Onset  . Diabetes Mother   . Hypertension Mother   . Hypertension Father   . Diabetes Father   . Asthma Daughter     History  Substance Use Topics  . Smoking status: Current Every Day Smoker -- 0.25 packs/day for 10 years    Types: Cigarettes    Last Attempt to Quit: 09/01/2012  . Smokeless tobacco: Never Used  . Alcohol Use: No    OB History   Grav Para Term Preterm Abortions TAB SAB Ect Mult Living   4 2 1 1 2  2          Review of Systems  All other systems reviewed and are  negative.    Allergies  Celecoxib; Fentanyl; Ibuprofen; Ketorolac tromethamine; Meperidine hcl; Morphine; Ondansetron; Shellfish allergy; Tramadol; Coconut flavor; Propoxyphene-acetaminophen; and Tylenol  Home Medications   Current Outpatient Rx  Name  Route  Sig  Dispense  Refill  . albuterol (PROVENTIL HFA;VENTOLIN HFA) 108 (90 BASE) MCG/ACT inhaler   Inhalation   Inhale 2 puffs into the lungs every 6 (six) hours as needed for wheezing.         . diphenhydrAMINE (BENADRYL) 25 MG tablet   Oral   Take 25 mg by mouth every 6 (six) hours as needed for itching.         Marland Kitchen HYDROcodone-acetaminophen (NORCO) 5-325 MG per tablet   Oral   Take 1 tablet by mouth every 6 (six) hours as needed for pain.   20 tablet   0   . HYDROmorphone (DILAUDID) 2 MG tablet   Oral   Take 2 mg by mouth every 6 (six) hours as needed for pain.         Marland Kitchen lipase/protease/amylase (CREON-12/PANCREASE) 12000 UNITS CPEP   Oral   Take 2 capsules by mouth 3 (three) times daily with meals.   270 capsule   0   . loperamide (IMODIUM) 2 MG capsule   Oral   Take 1 capsule (2 mg  total) by mouth 4 (four) times daily as needed for diarrhea or loose stools.   12 capsule   0     BP 122/77  Pulse 98  Temp(Src) 98.1 F (36.7 C) (Oral)  Resp 20  SpO2 96%  LMP 11/26/2012  Physical Exam  Nursing note and vitals reviewed. Constitutional: She is oriented to person, place, and time. She appears well-developed and well-nourished. No distress.  HENT:  Head: Normocephalic and atraumatic.  Nose: Nose normal.  Mouth/Throat: Oropharynx is clear and moist.  Eyes: Conjunctivae and EOM are normal. Pupils are equal, round, and reactive to light.  Neck: Normal range of motion. Neck supple. No JVD present. No tracheal deviation present. No thyromegaly present.  Cardiovascular: Normal rate, regular rhythm, normal heart sounds and intact distal pulses.  Exam reveals no gallop and no friction rub.   No murmur  heard. Pulmonary/Chest: Effort normal and breath sounds normal. No stridor. No respiratory distress. She has no wheezes. She has no rales. She exhibits no tenderness.  Abdominal: Soft. Bowel sounds are normal. She exhibits no distension and no mass. There is no tenderness. There is no rebound and no guarding.  Musculoskeletal: Normal range of motion. She exhibits no edema and no tenderness.  Lymphadenopathy:    She has no cervical adenopathy.  Neurological: She is alert and oriented to person, place, and time. She has normal reflexes. No cranial nerve deficit. She exhibits normal muscle tone. Coordination normal.  Skin: Skin is warm and dry. No rash noted. No erythema. No pallor.  Psychiatric: She has a normal mood and affect. Her behavior is normal. Judgment and thought content normal.    ED Course  Procedures (including critical care time)  Labs Reviewed - No data to display No results found.   1. Nausea vomiting, diarrhea & abdominal pain   2. Chronic abdominal pain       MDM  32 yo female with acute on chronic pain per her report along with nvd.  No signs seen.  Pt refuses meds here, left prior to receiving discharge paperwork.        Olivia Mackie, MD 12/12/12 [phone]

## 2012-12-12 NOTE — ED Notes (Signed)
Patient refused meds.  Patient left without getting discharge instructions and signing.

## 2012-12-12 NOTE — Discharge Instructions (Signed)
Please continue to follow up with your doctor and your surgeon.  Take your medications.      Chronic Pain Chronic pain can be defined as pain that is lasting, off and on, and lasts for 3 to 6 months or longer. Many things cause chronic pain, which can make it difficult to make a discrete diagnosis. There are many treatment options available for chronic pain. However, finding a treatment that works well for you may require trying various approaches until a suitable one is found. CAUSES  In some types of chronic medical conditions, the pain is caused by a normal pain response within the body. A normal pain response helps the body identify illness or injury and prevent further damage from being done. In these cases, the cause of the pain may be identified and treated, even if it may not be cured completely. Examples of chronic conditions which can cause chronic pain include:  Inflammation of the joints (arthritis).  Back pain or neck pain (including bulging or herniated disks).  Migraine headaches.  Cancer. In some other types of chronic pain syndromes, the pain is caused by an abnormal pain response within the body. An abnormal pain response is present when there is no ongoing cause (or stimulus) for the pain, or when the cause of the pain is arising from the nerves or nervous system itself. Examples of conditions which can cause chronic pain due to an abnormal pain response include:  Fibromyalgia.  Reflex sympathetic dystrophy (RSD).  Neuropathy (when the nerves themselves are damaged, and may cause pain). DIAGNOSIS  Your caregiver will help diagnose your condition over time. In many cases, the initial focus will be on excluding conditions that could be causing the pain. Depending on your symptoms, your caregiver may order some tests to diagnose your condition. Some of these tests include:  Blood tests.  Computerized X-ray scans (CT scan).  Computerized magnetic scans  (MRI).  X-rays.  Ultrasounds.  Nerve conduction studies.  Consultation with other physicians or specialists. TREATMENT  There are many treatment options for people suffering from chronic pain. Finding a treatment that works well may take time.   You may be referred to a pain management specialist.  You may be put on medication to help with the pain. Unfortunately, some medications (such as opiate medications) may not be very effective in cases where chronic pain is due to abnormal pain responses. Finding the right medications can take some time.  Adjunctive therapies may be used to provide additional relief and improve a patient's quality of life. These therapies include:  Mindfulness meditation.  Acupuncture.  Biofeedback.  Cognitive-behavioral therapy.  In certain cases, surgical interventions may be attempted. HOME CARE INSTRUCTIONS   Make sure you understand these instructions prior to discharge.  Ask any questions and share any further concerns you have with your caregiver prior to discharge.  Take all medications as directed by your caregiver.  Keep all follow-up appointments. SEEK MEDICAL CARE IF:   Your pain gets worse.  You develop a new pain that was not present before.  You cannot tolerate any medications prescribed by your caregiver.  You develop new symptoms since your last visit with your caregiver. SEEK IMMEDIATE MEDICAL CARE IF:   You develop muscular weakness.  You have decreased sensation or numbness.  You lose control of bowel or bladder function.  Your pain suddenly gets much worse.  You have an oral temperature above 102 F (38.9 C), not controlled by medication.  You develop shaking  chills, confusion, chest pain, or shortness of breath. Document Released: 03/13/2002 Document Revised: 09/13/2011 Document Reviewed: 06/19/2008 [facility] Patient Information 2014 [place], [place].

## 2012-12-12 NOTE — ED Notes (Signed)
PT. REPORTS PERSISTENT GENERALIZED ABDOMINAL PAIN WITH EMESIS AND DIARRHEA.

## 2012-12-12 NOTE — ED Notes (Signed)
Patient sitting on bed, reporting 10/10 abdominal pain, with nausea and vomiting and diarrhea.  Patient is NAD, resps easy, feeding daughter.  Patient is CAOx3.

## 2012-12-18 ENCOUNTER — Encounter (HOSPITAL_COMMUNITY): Payer: Self-pay | Admitting: Cardiology

## 2012-12-18 ENCOUNTER — Emergency Department (HOSPITAL_COMMUNITY)
Admission: EM | Admit: 2012-12-18 | Discharge: 2012-12-18 | Disposition: A | Discharge status: Home / Self Care | Payer: Medicaid Other | Attending: Emergency Medicine | Admitting: Emergency Medicine

## 2012-12-18 DIAGNOSIS — Z8719 Personal history of other diseases of the digestive system: Secondary | ICD-10-CM | POA: Insufficient documentation

## 2012-12-18 DIAGNOSIS — R6883 Chills (without fever): Secondary | ICD-10-CM | POA: Insufficient documentation

## 2012-12-18 DIAGNOSIS — R197 Diarrhea, unspecified: Secondary | ICD-10-CM | POA: Insufficient documentation

## 2012-12-18 DIAGNOSIS — R112 Nausea with vomiting, unspecified: Secondary | ICD-10-CM | POA: Insufficient documentation

## 2012-12-18 DIAGNOSIS — Z8742 Personal history of other diseases of the female genital tract: Secondary | ICD-10-CM | POA: Insufficient documentation

## 2012-12-18 DIAGNOSIS — Z79899 Other long term (current) drug therapy: Secondary | ICD-10-CM | POA: Insufficient documentation

## 2012-12-18 DIAGNOSIS — Z862 Personal history of diseases of the blood and blood-forming organs and certain disorders involving the immune mechanism: Secondary | ICD-10-CM | POA: Insufficient documentation

## 2012-12-18 DIAGNOSIS — G8929 Other chronic pain: Secondary | ICD-10-CM

## 2012-12-18 DIAGNOSIS — Z3202 Encounter for pregnancy test, result negative: Secondary | ICD-10-CM | POA: Insufficient documentation

## 2012-12-18 DIAGNOSIS — Z9049 Acquired absence of other specified parts of digestive tract: Secondary | ICD-10-CM | POA: Insufficient documentation

## 2012-12-18 DIAGNOSIS — R109 Unspecified abdominal pain: Secondary | ICD-10-CM | POA: Insufficient documentation

## 2012-12-18 DIAGNOSIS — F17200 Nicotine dependence, unspecified, uncomplicated: Secondary | ICD-10-CM | POA: Insufficient documentation

## 2012-12-18 LAB — COMPREHENSIVE METABOLIC PANEL
ALT: 23 U/L (ref 0–35)
AST: 26 U/L (ref 0–37)
Alkaline Phosphatase: 66 U/L (ref 39–117)
CO2: 22 mEq/L (ref 19–32)
Calcium: 9.1 mg/dL (ref 8.4–10.5)
GFR calc non Af Amer: >90 mL/min (ref >90)
Potassium: 3.9 mEq/L (ref 3.5–5.1)
Sodium: 140 mEq/L (ref 135–145)
Total Protein: 7.4 g/dL (ref 6.0–8.3)

## 2012-12-18 LAB — URINALYSIS, ROUTINE W REFLEX MICROSCOPIC
Bilirubin Urine: NEGATIVE
Glucose, UA: NEGATIVE mg/dL
Hgb urine dipstick: NEGATIVE
Specific Gravity, Urine: 1.034 — ABNORMAL HIGH (ref 1.005–1.030)

## 2012-12-18 LAB — CBC WITH DIFFERENTIAL/PLATELET
Basophils Absolute: 0 10*3/uL (ref 0.0–0.1)
Eosinophils Relative: 7 % — ABNORMAL HIGH (ref 0–5)
Lymphocytes Relative: 47 % — ABNORMAL HIGH (ref 12–46)
Lymphs Abs: 3.1 10*3/uL (ref 0.7–4.0)
MCV: 79.9 fL (ref 78.0–100.0)
Neutrophils Relative %: 36 % — ABNORMAL LOW (ref 43–77)
Platelets: 242 10*3/uL (ref 150–400)
RBC: 4.42 MIL/uL (ref 3.87–5.11)
RDW: 12.1 % (ref 11.5–15.5)
WBC: 6.5 10*3/uL (ref 4.0–10.5)

## 2012-12-18 LAB — POCT PREGNANCY, URINE: Preg Test, Ur: NEGATIVE

## 2012-12-18 MED ORDER — PROMETHAZINE HCL 25 MG/ML IJ SOLN
25.0000 mg | Freq: Once | INTRAMUSCULAR | Status: AC
  Administered 2012-12-18: 25 mg via INTRAMUSCULAR
  Filled 2012-12-18: qty 1

## 2012-12-18 MED ORDER — DIPHENHYDRAMINE HCL 50 MG/ML IJ SOLN
25.0000 mg | Freq: Once | INTRAMUSCULAR | Status: AC
  Administered 2012-12-18: 25 mg via INTRAMUSCULAR
  Filled 2012-12-18: qty 1

## 2012-12-18 MED ORDER — HYDROCODONE-ACETAMINOPHEN 5-325 MG PO TABS
1.0000 | ORAL_TABLET | Freq: Once | ORAL | Status: AC
  Administered 2012-12-18: 1 via ORAL
  Filled 2012-12-18: qty 1

## 2012-12-18 NOTE — ED Notes (Signed)
Pt states that she has been having pain in her right lower quadrant since Thursday, and states that she has been having pain in her pancreas x 4 weeks. Pt states that she has a hx of pancreatitis.

## 2012-12-18 NOTE — ED Notes (Signed)
Pt reports abd pain that started last Thursday. Reports that she has been to her PCP office and states they are worried about appendicitis. Also reports she has hx of pancreatitis and thinks that she is having a flare up.

## 2012-12-18 NOTE — ED Provider Notes (Signed)
History     CSN: [id number]  Arrival date & time 12/18/12  1523   First MD Initiated Contact with Patient 12/18/12 2006      Chief Complaint  Patient presents with  . Abdominal Pain   HPI  History provided by the patient. The patient is a 32 year old female with history of chronic pancreatitis, fibroids, chronic abdominal pain who presents with complaints of similar chronic left upper quadrant abdominal pains radiating into the right lower cautery. Symptoms have been going on for the past 4 weeks. They have been associated with episodes of nausea vomiting at times which worsened today. Patient states she has not been able to keep down any of her own pain medications due to her nausea and vomiting. She also complains of multiple episodes of soft diarrhea-like stools which has prevented her from using rectal Phenergan for nausea and vomiting. She denies any fevers. Denies any urinary complaints. No other changes in her chronic symptoms. Denies any other aggravating or alleviating factors. No other associated symptoms.    Past Medical History  Diagnosis Date  . Pancreatitis   . S/P laparoscopic cholecystectomy   . Fibroids   . Ovarian cyst   . Chronic abdominal pain   . Anemia     Past Surgical History  Procedure Laterality Date  . Cholecystectomy    . Dilation and curettage of uterus    . Ercp w/ metal stent placement      Family History  Problem Relation Age of Onset  . Diabetes Mother   . Hypertension Mother   . Hypertension Father   . Diabetes Father   . Asthma Daughter     History  Substance Use Topics  . Smoking status: Current Every Day Smoker -- 0.25 packs/day for 10 years    Types: Cigarettes    Last Attempt to Quit: 09/01/2012  . Smokeless tobacco: Never Used  . Alcohol Use: No    OB History   Grav Para Term Preterm Abortions TAB SAB Ect Mult Living   4 2 1 1 2  2          Review of Systems  Constitutional: Positive for chills and appetite change.  Negative for fever and diaphoresis.  Respiratory: Negative for shortness of breath.   Cardiovascular: Negative for chest pain.  Gastrointestinal: Positive for nausea, vomiting, abdominal pain and diarrhea. Negative for constipation.  Genitourinary: Negative for dysuria, frequency, hematuria and flank pain.  All other systems reviewed and are negative.    Allergies  Celecoxib; Fentanyl; Ibuprofen; Ketorolac tromethamine; Meperidine hcl; Morphine; Ondansetron; Shellfish allergy; Tramadol; Coconut flavor; Propoxyphene-acetaminophen; and Tylenol  Home Medications   Current Outpatient Rx  Name  Route  Sig  Dispense  Refill  . albuterol (PROVENTIL HFA;VENTOLIN HFA) 108 (90 BASE) MCG/ACT inhaler   Inhalation   Inhale 2 puffs into the lungs every 6 (six) hours as needed for wheezing.         . diphenhydrAMINE (BENADRYL) 25 MG tablet   Oral   Take 25 mg by mouth every 6 (six) hours as needed for itching.         Marland Kitchen HYDROcodone-acetaminophen (NORCO/VICODIN) 5-325 MG per tablet   Oral   Take 1 tablet by mouth every 6 (six) hours as needed for pain.         Marland Kitchen HYDROmorphone (DILAUDID) 2 MG tablet   Oral   Take 2 mg by mouth every 6 (six) hours as needed for pain.         Marland Kitchen  lipase/protease/amylase (CREON-12/PANCREASE) 12000 UNITS CPEP   Oral   Take 2 capsules by mouth 3 (three) times daily with meals.         . metoCLOPramide (REGLAN) 5 MG tablet   Oral   Take 5 mg by mouth 3 (three) times daily as needed (for nausea).           BP 143/89  Pulse 76  Temp(Src) 98.6 F (37 C) (Oral)  Resp 18  SpO2 100%  LMP 11/26/2012  Physical Exam  Nursing note and vitals reviewed. Constitutional: She is oriented to person, place, and time. She appears well-developed and well-nourished. No distress.  HENT:  Head: Normocephalic.  Cardiovascular: Normal rate and regular rhythm.   Pulmonary/Chest: Effort normal and breath sounds normal. No respiratory distress. She has no wheezes.  She has no rales.  Abdominal: Soft. There is tenderness in the right lower quadrant and left upper quadrant. There is no rebound, no guarding, no CVA tenderness, no tenderness at McBurney's point and negative Murphy's sign.  Musculoskeletal: Normal range of motion.  Neurological: She is alert and oriented to person, place, and time.  Skin: Skin is warm and dry. No rash noted.  Psychiatric: She has a normal mood and affect. Her behavior is normal.    ED Course  Procedures   Results for orders placed during the hospital encounter of 12/18/12  CBC WITH DIFFERENTIAL      Result Value Range   WBC 6.5  4.0 - 10.5 K/uL   RBC 4.42  3.87 - 5.11 MIL/uL   Hemoglobin 12.3  12.0 - 15.0 g/dL   HCT 40.9 (*) 81.1 - 91.4 %   MCV 79.9  78.0 - 100.0 fL   MCH 27.8  26.0 - 34.0 pg   MCHC 34.8  30.0 - 36.0 g/dL   RDW 78.2  95.6 - 21.3 %   Platelets 242  150 - 400 K/uL   Neutrophils Relative % 36 (*) 43 - 77 %   Neutro Abs 2.4  1.7 - 7.7 K/uL   Lymphocytes Relative 47 (*) 12 - 46 %   Lymphs Abs 3.1  0.7 - 4.0 K/uL   Monocytes Relative 9  3 - 12 %   Monocytes Absolute 0.6  0.1 - 1.0 K/uL   Eosinophils Relative 7 (*) 0 - 5 %   Eosinophils Absolute 0.4  0.0 - 0.7 K/uL   Basophils Relative 1  0 - 1 %   Basophils Absolute 0.0  0.0 - 0.1 K/uL  COMPREHENSIVE METABOLIC PANEL      Result Value Range   Sodium 140  135 - 145 mEq/L   Potassium 3.9  3.5 - 5.1 mEq/L   Chloride 107  96 - 112 mEq/L   CO2 22  19 - 32 mEq/L   Glucose, Bld 87  70 - 99 mg/dL   BUN 15  6 - 23 mg/dL   Creatinine, Ser 0.86  0.50 - 1.10 mg/dL   Calcium 9.1  8.4 - 57.8 mg/dL   Total Protein 7.4  6.0 - 8.3 g/dL   Albumin 3.7  3.5 - 5.2 g/dL   AST 26  0 - 37 U/L   ALT 23  0 - 35 U/L   Alkaline Phosphatase 66  39 - 117 U/L   Total Bilirubin 0.2 (*) 0.3 - 1.2 mg/dL   GFR calc non Af Amer >90  >90 mL/min   GFR calc Af Amer >90  >90 mL/min  LIPASE, BLOOD  Result Value Range   Lipase 82 (*) 11 - 59 U/L  URINALYSIS, ROUTINE W  REFLEX MICROSCOPIC      Result Value Range   Color, Urine YELLOW  YELLOW   APPearance CLEAR  CLEAR   Specific Gravity, Urine 1.034 (*) 1.005 - 1.030   pH 5.5  5.0 - 8.0   Glucose, UA NEGATIVE  NEGATIVE mg/dL   Hgb urine dipstick NEGATIVE  NEGATIVE   Bilirubin Urine NEGATIVE  NEGATIVE   Ketones, ur NEGATIVE  NEGATIVE mg/dL   Protein, ur NEGATIVE  NEGATIVE mg/dL   Urobilinogen, UA 0.2  0.0 - 1.0 mg/dL   Nitrite NEGATIVE  NEGATIVE   Leukocytes, UA NEGATIVE  NEGATIVE  POCT PREGNANCY, URINE      Result Value Range   Preg Test, Ur NEGATIVE  NEGATIVE        1. Chronic abdominal pain       MDM  9:40PM patient seen and evaluated. Patient resting in bed appears well in no acute distress. Patient has frequent multiple visits for similar symptoms emergency room. Normal white count today. No peritoneal signs and abdominals exam. She has very slightly elevated lipase consistent with her history of chronic pancreatitis issues. She has been tolerating fluids at this time she may be discharged follow up with her doctors.    Angus Seller, PA-C 12/19/12 [phone]

## 2012-12-19 NOTE — ED Provider Notes (Signed)
  Medical screening examination/treatment/procedure(s) were performed by non-physician practitioner and as supervising physician I was immediately available for consultation/collaboration.    Gerhard Munch, MD 12/19/12 2253

## 2012-12-23 ENCOUNTER — Encounter (HOSPITAL_COMMUNITY): Payer: Self-pay

## 2012-12-23 DIAGNOSIS — F17200 Nicotine dependence, unspecified, uncomplicated: Secondary | ICD-10-CM | POA: Insufficient documentation

## 2012-12-23 DIAGNOSIS — Z862 Personal history of diseases of the blood and blood-forming organs and certain disorders involving the immune mechanism: Secondary | ICD-10-CM | POA: Insufficient documentation

## 2012-12-23 DIAGNOSIS — Z79899 Other long term (current) drug therapy: Secondary | ICD-10-CM | POA: Insufficient documentation

## 2012-12-23 DIAGNOSIS — Z9049 Acquired absence of other specified parts of digestive tract: Secondary | ICD-10-CM | POA: Insufficient documentation

## 2012-12-23 DIAGNOSIS — R110 Nausea: Secondary | ICD-10-CM | POA: Insufficient documentation

## 2012-12-23 DIAGNOSIS — Z8719 Personal history of other diseases of the digestive system: Secondary | ICD-10-CM | POA: Insufficient documentation

## 2012-12-23 DIAGNOSIS — G8929 Other chronic pain: Secondary | ICD-10-CM | POA: Insufficient documentation

## 2012-12-23 DIAGNOSIS — Z8742 Personal history of other diseases of the female genital tract: Secondary | ICD-10-CM | POA: Insufficient documentation

## 2012-12-23 DIAGNOSIS — R197 Diarrhea, unspecified: Secondary | ICD-10-CM | POA: Insufficient documentation

## 2012-12-23 NOTE — ED Notes (Signed)
Patient refusing labs to be drawn until she receives an IV

## 2012-12-23 NOTE — ED Notes (Signed)
Patient presents with upper left and middle abdominal pain tonight, and reports pain feels like her pancreatitis flare up.  Patient also reporting nausea and diarrhea intermittently x 7 days.

## 2012-12-24 ENCOUNTER — Emergency Department (HOSPITAL_COMMUNITY)
Admission: EM | Admit: 2012-12-24 | Discharge: 2012-12-24 | Disposition: A | Discharge status: Home / Self Care | Payer: Medicaid Other | Attending: Emergency Medicine | Admitting: Emergency Medicine

## 2012-12-24 DIAGNOSIS — G8929 Other chronic pain: Secondary | ICD-10-CM

## 2012-12-24 LAB — CBC WITH DIFFERENTIAL/PLATELET
Basophils Absolute: 0 10*3/uL (ref 0.0–0.1)
HCT: 35.1 % — ABNORMAL LOW (ref 36.0–46.0)
Hemoglobin: 12.2 g/dL (ref 12.0–15.0)
Lymphocytes Relative: 42 % (ref 12–46)
Lymphs Abs: 2.7 10*3/uL (ref 0.7–4.0)
Monocytes Absolute: 0.6 10*3/uL (ref 0.1–1.0)
Monocytes Relative: 9 % (ref 3–12)
Neutro Abs: 2.7 10*3/uL (ref 1.7–7.7)
WBC: 6.5 10*3/uL (ref 4.0–10.5)

## 2012-12-24 LAB — COMPREHENSIVE METABOLIC PANEL
AST: 28 U/L (ref 0–37)
BUN: 11 mg/dL (ref 6–23)
CO2: 24 mEq/L (ref 19–32)
Chloride: 103 mEq/L (ref 96–112)
Creatinine, Ser: 0.6 mg/dL (ref 0.50–1.10)
GFR calc non Af Amer: >90 mL/min (ref >90)
Glucose, Bld: 95 mg/dL (ref 70–99)
Total Bilirubin: 0.3 mg/dL (ref 0.3–1.2)

## 2012-12-24 MED ORDER — POTASSIUM CHLORIDE CRYS ER 20 MEQ PO TBCR
40.0000 meq | EXTENDED_RELEASE_TABLET | Freq: Once | ORAL | Status: AC
  Administered 2012-12-24: 40 meq via ORAL
  Filled 2012-12-24: qty 2

## 2012-12-24 NOTE — ED Provider Notes (Signed)
History     CSN: [id number]  Arrival date & time 12/23/12  2051   First MD Initiated Contact with Patient 12/24/12 0126      Chief Complaint  Patient presents with  . Abdominal Pain   HPI  History provided by the patient. Patient is a 32 year old female with history of chronic abdominal pains, chronic pancreatitis, cholecystectomy who presents with complaints of similar left upper quadrant chronic abdominal pains. Pains have been worsening over the last 5 days. They're associated with nausea and frequent episodes of diarrhea. Diarrhea has been present for 7 days without or mucus. She states she has a chronic diarrhea issue. There is no other significant changes in her chronic pain. Pain radiates around to the left flank and back. It is sharp in nature described as 10 out of 10. She has been taking home pain medications without improvement. Denies any other aggravating or alleviating factors. No other associated symptoms. No fever, chills or sweats.    Past Medical History  Diagnosis Date  . Pancreatitis   . S/P laparoscopic cholecystectomy   . Fibroids   . Ovarian cyst   . Chronic abdominal pain   . Anemia     Past Surgical History  Procedure Laterality Date  . Cholecystectomy    . Dilation and curettage of uterus    . Ercp w/ metal stent placement      Family History  Problem Relation Age of Onset  . Diabetes Mother   . Hypertension Mother   . Hypertension Father   . Diabetes Father   . Asthma Daughter     History  Substance Use Topics  . Smoking status: Current Every Day Smoker -- 0.25 packs/day for 10 years    Types: Cigarettes    Last Attempt to Quit: 09/01/2012  . Smokeless tobacco: Never Used  . Alcohol Use: No    OB History   Grav Para Term Preterm Abortions TAB SAB Ect Mult Living   4 2 1 1 2  2          Review of Systems  Constitutional: Negative for fever, chills and diaphoresis.  Gastrointestinal: Positive for nausea, abdominal pain and diarrhea.  Negative for vomiting.  Genitourinary: Negative for dysuria, frequency, hematuria and flank pain.  All other systems reviewed and are negative.    Allergies  Celecoxib; Fentanyl; Ibuprofen; Ketorolac tromethamine; Meperidine hcl; Morphine; Ondansetron; Shellfish allergy; Tramadol; Vicodin; Coconut flavor; Propoxyphene-acetaminophen; and Tylenol  Home Medications   Current Outpatient Rx  Name  Route  Sig  Dispense  Refill  . diphenhydrAMINE (BENADRYL) 25 MG tablet   Oral   Take 25 mg by mouth every 6 (six) hours as needed for itching.         Marland Kitchen HYDROcodone-acetaminophen (NORCO/VICODIN) 5-325 MG per tablet   Oral   Take 1 tablet by mouth every 6 (six) hours as needed for pain.         Marland Kitchen HYDROmorphone (DILAUDID) 2 MG tablet   Oral   Take 2 mg by mouth every 6 (six) hours as needed for pain.         Marland Kitchen lipase/protease/amylase (CREON-12/PANCREASE) 12000 UNITS CPEP   Oral   Take 2 capsules by mouth 3 (three) times daily with meals.         . metoCLOPramide (REGLAN) 5 MG tablet   Oral   Take 5 mg by mouth 3 (three) times daily as needed (for nausea).           BP 140/75  Pulse 91  Temp(Src) 98.7 F (37.1 C) (Oral)  Resp 20  SpO2 99%  LMP 11/26/2012  Physical Exam  Nursing note and vitals reviewed. Constitutional: She is oriented to person, place, and time. She appears well-developed and well-nourished. No distress.  HENT:  Head: Normocephalic.  Cardiovascular: Normal rate and regular rhythm.   Pulmonary/Chest: Effort normal and breath sounds normal. No respiratory distress. She has no wheezes. She has no rales.  Abdominal: Soft. There is tenderness in the epigastric area, periumbilical area and left upper quadrant. There is no rebound, no guarding, no CVA tenderness, no tenderness at McBurney's point and negative Murphy's sign.  Musculoskeletal: Normal range of motion.  Neurological: She is alert and oriented to person, place, and time.  Skin: Skin is warm and  dry. No rash noted.  Psychiatric: She has a normal mood and affect. Her behavior is normal.    ED Course  Procedures   Medications  potassium chloride SA (K-DUR,KLOR-CON) CR tablet 40 mEq (40 mEq Oral Given 12/24/12 0343)      Results for orders placed during the hospital encounter of 12/24/12  CBC WITH DIFFERENTIAL      Result Value Range   WBC 6.5  4.0 - 10.5 K/uL   RBC 4.41  3.87 - 5.11 MIL/uL   Hemoglobin 12.2  12.0 - 15.0 g/dL   HCT 16.1 (*) 09.6 - 04.5 %   MCV 79.6  78.0 - 100.0 fL   MCH 27.7  26.0 - 34.0 pg   MCHC 34.8  30.0 - 36.0 g/dL   RDW 40.9  81.1 - 91.4 %   Platelets 263  150 - 400 K/uL   Neutrophils Relative % 42 (*) 43 - 77 %   Neutro Abs 2.7  1.7 - 7.7 K/uL   Lymphocytes Relative 42  12 - 46 %   Lymphs Abs 2.7  0.7 - 4.0 K/uL   Monocytes Relative 9  3 - 12 %   Monocytes Absolute 0.6  0.1 - 1.0 K/uL   Eosinophils Relative 6 (*) 0 - 5 %   Eosinophils Absolute 0.4  0.0 - 0.7 K/uL   Basophils Relative 1  0 - 1 %   Basophils Absolute 0.0  0.0 - 0.1 K/uL  COMPREHENSIVE METABOLIC PANEL      Result Value Range   Sodium 136  135 - 145 mEq/L   Potassium 3.4 (*) 3.5 - 5.1 mEq/L   Chloride 103  96 - 112 mEq/L   CO2 24  19 - 32 mEq/L   Glucose, Bld 95  70 - 99 mg/dL   BUN 11  6 - 23 mg/dL   Creatinine, Ser 7.82  0.50 - 1.10 mg/dL   Calcium 9.0  8.4 - 95.6 mg/dL   Total Protein 7.5  6.0 - 8.3 g/dL   Albumin 3.9  3.5 - 5.2 g/dL   AST 28  0 - 37 U/L   ALT 23  0 - 35 U/L   Alkaline Phosphatase 72  39 - 117 U/L   Total Bilirubin 0.3  0.3 - 1.2 mg/dL   GFR calc non Af Amer >90  >90 mL/min   GFR calc Af Amer >90  >90 mL/min  LIPASE, BLOOD      Result Value Range   Lipase 25  11 - 59 U/L       1. Chronic pain       MDM  Patient seen and evaluated. Patient sitting in bed appears comfortable in no acute distress  or any signs of significant discomfort.  Her labs are unremarkable. Pains are chronic. She will be discharged to manage her symptoms with home  medications and PCP followup.        Angus Seller, PA-C 12/24/12 2024

## 2012-12-25 ENCOUNTER — Ambulatory Visit (INDEPENDENT_AMBULATORY_CARE_PROVIDER_SITE_OTHER): Payer: Self-pay | Admitting: General Surgery

## 2012-12-25 NOTE — ED Provider Notes (Signed)
  Medical screening examination/treatment/procedure(s) were performed by non-physician practitioner and as supervising physician I was immediately available for consultation/collaboration.    Manford Sprong, MD 12/25/12 0608 

## 2012-12-29 ENCOUNTER — Encounter (HOSPITAL_COMMUNITY): Payer: Self-pay | Admitting: Emergency Medicine

## 2012-12-29 ENCOUNTER — Emergency Department (HOSPITAL_COMMUNITY)
Admission: EM | Admit: 2012-12-29 | Discharge: 2012-12-30 | Disposition: A | Discharge status: Home / Self Care | Payer: Medicaid Other | Attending: Emergency Medicine | Admitting: Emergency Medicine

## 2012-12-29 ENCOUNTER — Telehealth (INDEPENDENT_AMBULATORY_CARE_PROVIDER_SITE_OTHER): Payer: Self-pay

## 2012-12-29 DIAGNOSIS — Z862 Personal history of diseases of the blood and blood-forming organs and certain disorders involving the immune mechanism: Secondary | ICD-10-CM | POA: Insufficient documentation

## 2012-12-29 DIAGNOSIS — G8929 Other chronic pain: Secondary | ICD-10-CM | POA: Insufficient documentation

## 2012-12-29 DIAGNOSIS — Z8719 Personal history of other diseases of the digestive system: Secondary | ICD-10-CM | POA: Insufficient documentation

## 2012-12-29 DIAGNOSIS — R109 Unspecified abdominal pain: Secondary | ICD-10-CM

## 2012-12-29 DIAGNOSIS — Z79899 Other long term (current) drug therapy: Secondary | ICD-10-CM | POA: Insufficient documentation

## 2012-12-29 DIAGNOSIS — R112 Nausea with vomiting, unspecified: Secondary | ICD-10-CM | POA: Insufficient documentation

## 2012-12-29 DIAGNOSIS — Z8742 Personal history of other diseases of the female genital tract: Secondary | ICD-10-CM | POA: Insufficient documentation

## 2012-12-29 DIAGNOSIS — Z9889 Other specified postprocedural states: Secondary | ICD-10-CM | POA: Insufficient documentation

## 2012-12-29 DIAGNOSIS — F17200 Nicotine dependence, unspecified, uncomplicated: Secondary | ICD-10-CM | POA: Insufficient documentation

## 2012-12-29 DIAGNOSIS — Z8542 Personal history of malignant neoplasm of other parts of uterus: Secondary | ICD-10-CM | POA: Insufficient documentation

## 2012-12-29 NOTE — Telephone Encounter (Signed)
Pt called to reschedule her appt with Dr. Carolynne Edouard.  She was seen in the ER on 6/22 for abdominal pain.  The note from ER physician states she needs to f/u with her PCP for further evaluation and workup.  I called her back and LMOV with this information.

## 2012-12-29 NOTE — ED Notes (Signed)
PT. REPORTS CHRONIC RIGHT ABDOMINAL PAIN WITH WITH EMESIS AND DIARRHEA.

## 2012-12-30 MED ORDER — METOCLOPRAMIDE HCL 5 MG/ML IJ SOLN: 10.0000 mg | Freq: Once | INTRAMUSCULAR | Status: DC

## 2012-12-30 NOTE — ED Provider Notes (Signed)
History    CSN: [id number] Arrival date & time 12/29/12  2116  First MD Initiated Contact with Patient 12/30/12 0043     Chief Complaint  Patient presents with  . Abdominal Pain   (Consider location/radiation/quality/duration/timing/severity/associated sxs/prior Treatment) HPI Hx provided by PT - Has h/o chronic ABD pain, presents tonight stating her PCP referred her here for pain medications as her medicines at home are not working.  She states she has associated N/V and cant hold anything down.  She is followed at [facility] for chronic pancreatitis. Has narcotic dependence. No F/C. No rash, no CP or SOB.  Past Medical History  Diagnosis Date  . Pancreatitis   . S/P laparoscopic cholecystectomy   . Fibroids   . Ovarian cyst   . Chronic abdominal pain   . Anemia    Past Surgical History  Procedure Laterality Date  . Cholecystectomy    . Dilation and curettage of uterus    . Ercp w/ metal stent placement     Family History  Problem Relation Age of Onset  . Diabetes Mother   . Hypertension Mother   . Hypertension Father   . Diabetes Father   . Asthma Daughter    History  Substance Use Topics  . Smoking status: Current Every Day Smoker -- 0.25 packs/day for 10 years    Types: Cigarettes    Last Attempt to Quit: 09/01/2012  . Smokeless tobacco: Never Used  . Alcohol Use: No   OB History   Grav Para Term Preterm Abortions TAB SAB Ect Mult Living   4 2 1 1 2  2         Review of Systems  Constitutional: Negative for fever and chills.  HENT: Negative for neck pain and neck stiffness.   Eyes: Negative for pain.  Respiratory: Negative for shortness of breath.   Cardiovascular: Negative for chest pain.  Gastrointestinal: Positive for abdominal pain. Negative for blood in stool.  Genitourinary: Negative for dysuria.  Musculoskeletal: Negative for back pain.  Skin: Negative for rash.  Neurological: Negative for headaches.  All other systems reviewed and are  negative.    Allergies  Celecoxib; Fentanyl; Ibuprofen; Ketorolac tromethamine; Meperidine hcl; Morphine; Ondansetron; Shellfish allergy; Tramadol; Vicodin; Coconut flavor; Propoxyphene-acetaminophen; and Tylenol  Home Medications   Current Outpatient Rx  Name  Route  Sig  Dispense  Refill  . diphenhydrAMINE (BENADRYL) 25 MG tablet   Oral   Take 25 mg by mouth every 6 (six) hours as needed for itching.         Marland Kitchen HYDROcodone-acetaminophen (NORCO/VICODIN) 5-325 MG per tablet   Oral   Take 1 tablet by mouth every 6 (six) hours as needed for pain.         Marland Kitchen HYDROmorphone (DILAUDID) 2 MG tablet   Oral   Take 2 mg by mouth every 6 (six) hours as needed for pain.         Marland Kitchen lipase/protease/amylase (CREON-12/PANCREASE) 12000 UNITS CPEP   Oral   Take 2 capsules by mouth 3 (three) times daily with meals.         . metoCLOPramide (REGLAN) 5 MG tablet   Oral   Take 5 mg by mouth 3 (three) times daily as needed (for nausea).          BP 130/95  Pulse 70  Temp(Src) 98.9 F (37.2 C) (Oral)  Resp 20  SpO2 100%  LMP 11/26/2012 Physical Exam  Constitutional: She is oriented to person, place, and  time. She appears well-developed and well-nourished.  HENT:  Head: Normocephalic and atraumatic.  Mouth/Throat: Oropharynx is clear and moist. No oropharyngeal exudate.  mmm  Eyes: EOM are normal. Pupils are equal, round, and reactive to light. No scleral icterus.  Neck: Neck supple.  Cardiovascular: Normal rate, regular rhythm and intact distal pulses.   Pulmonary/Chest: Effort normal and breath sounds normal. No respiratory distress.  Abdominal: Soft. Bowel sounds are normal. She exhibits no distension and no mass. There is no tenderness. There is no rebound and no guarding.  Musculoskeletal: Normal range of motion. She exhibits no edema.  Neurological: She is alert and oriented to person, place, and time.  Skin: Skin is warm and dry.    ED Course  Procedures (including  critical care time)  IM reglan and PO fluids  Recheck unchanged, benign ABD exam   Plan d/c home to f/u PCP. No indication for labs or ED work up at this time  MDM  Chronic ABD pain with benign ABD exams  IM reglan and PO challenge  VS, old records and nurses notes reviewed  Sunnie Nielsen, MD 12/30/12 [phone]

## 2012-12-30 NOTE — ED Notes (Addendum)
Pt significant other out to nurse's station, states pt wants to leave. This RN spoke with pt, pt states wants to leave, refuses to sign AMA paperwork or have vitals taken. PT refuses Reglan medication. EDP aware

## 2013-01-01 ENCOUNTER — Encounter (HOSPITAL_COMMUNITY): Payer: Self-pay | Admitting: *Deleted

## 2013-01-01 ENCOUNTER — Other Ambulatory Visit: Payer: Self-pay | Admitting: Medical

## 2013-01-01 ENCOUNTER — Inpatient Hospital Stay (HOSPITAL_COMMUNITY)
Admission: AD | Admit: 2013-01-01 | Discharge: 2013-01-01 | Disposition: A | Discharge status: Home / Self Care | Payer: Medicaid Other | Source: Ambulatory Visit | Attending: Obstetrics & Gynecology | Admitting: Obstetrics & Gynecology

## 2013-01-01 DIAGNOSIS — R1031 Right lower quadrant pain: Secondary | ICD-10-CM | POA: Insufficient documentation

## 2013-01-01 DIAGNOSIS — R197 Diarrhea, unspecified: Secondary | ICD-10-CM | POA: Insufficient documentation

## 2013-01-01 DIAGNOSIS — R112 Nausea with vomiting, unspecified: Secondary | ICD-10-CM | POA: Insufficient documentation

## 2013-01-01 DIAGNOSIS — R109 Unspecified abdominal pain: Secondary | ICD-10-CM

## 2013-01-01 DIAGNOSIS — K861 Other chronic pancreatitis: Secondary | ICD-10-CM | POA: Insufficient documentation

## 2013-01-01 DIAGNOSIS — R1012 Left upper quadrant pain: Secondary | ICD-10-CM | POA: Insufficient documentation

## 2013-01-01 LAB — RAPID URINE DRUG SCREEN, HOSP PERFORMED
Amphetamines: NOT DETECTED
Barbiturates: NOT DETECTED
Benzodiazepines: NOT DETECTED
Cocaine: NOT DETECTED
Tetrahydrocannabinol: NOT DETECTED

## 2013-01-01 LAB — URINALYSIS, ROUTINE W REFLEX MICROSCOPIC
Hgb urine dipstick: NEGATIVE
Leukocytes, UA: NEGATIVE
Protein, ur: NEGATIVE mg/dL
Urobilinogen, UA: 2 mg/dL — ABNORMAL HIGH (ref 0.0–1.0)

## 2013-01-01 LAB — COMPREHENSIVE METABOLIC PANEL
AST: 36 U/L (ref 0–37)
Albumin: 3.8 g/dL (ref 3.5–5.2)
Chloride: 101 mEq/L (ref 96–112)
Creatinine, Ser: 0.73 mg/dL (ref 0.50–1.10)
Total Bilirubin: 0.4 mg/dL (ref 0.3–1.2)
Total Protein: 8 g/dL (ref 6.0–8.3)

## 2013-01-01 LAB — CBC
MCHC: 34.3 g/dL (ref 30.0–36.0)
MCV: 80.7 fL (ref 78.0–100.0)
Platelets: 267 10*3/uL (ref 150–400)
RDW: 12.7 % (ref 11.5–15.5)
WBC: 9.1 10*3/uL (ref 4.0–10.5)

## 2013-01-01 MED ORDER — CODEINE SULFATE 30 MG/5ML PO SOLN: 30.0000 mg | ORAL | Status: DC | PRN

## 2013-01-01 MED ORDER — OXYCODONE-ACETAMINOPHEN 5-325 MG PO TABS: 1.0000 | ORAL_TABLET | ORAL | Status: DC | PRN

## 2013-01-01 MED ORDER — PROMETHAZINE HCL 25 MG/ML IJ SOLN
25.0000 mg | Freq: Once | INTRAMUSCULAR | Status: DC
  Filled 2013-01-01: qty 1

## 2013-01-01 MED ORDER — OXYCODONE HCL 5 MG PO TABS
10.0000 mg | ORAL_TABLET | Freq: Once | ORAL | Status: AC
  Administered 2013-01-01: 10 mg via ORAL
  Filled 2013-01-01: qty 2

## 2013-01-01 NOTE — MAU Provider Note (Signed)
History     CSN: [id number]  Arrival date and time: 01/01/13 1626   First Provider Initiated Contact with Patient 01/01/13 1838      Chief Complaint  Patient presents with  . Abdominal Pain  . Diarrhea  . Emesis   HPI 32 y.o. [id number] with n/v and abodominal pain x 4 weeks, diarrhea x 9 days (vomiting and diarrhea 5-6 x per day). "Not able to keep anything down."  Abd pain is epigastric and RLQ, both constant, RLQ pain wraps to lower back. Multiple allergies, pt states she can't tolerate PO meds. Dilaudid is the only thing that works that she can tolerate. Has had multiple emergency room visits for pain related c/o where she has received dilaudid.   Past Medical History  Diagnosis Date  . Pancreatitis   . S/P laparoscopic cholecystectomy   . Fibroids   . Ovarian cyst   . Chronic abdominal pain   . Anemia     Past Surgical History  Procedure Laterality Date  . Cholecystectomy    . Dilation and curettage of uterus    . Ercp w/ metal stent placement      Family History  Problem Relation Age of Onset  . Diabetes Mother   . Hypertension Mother   . Hypertension Father   . Diabetes Father   . Asthma Daughter     History  Substance Use Topics  . Smoking status: Current Every Day Smoker -- 0.25 packs/day for 10 years    Types: Cigarettes    Last Attempt to Quit: 09/01/2012  . Smokeless tobacco: Never Used  . Alcohol Use: No    Allergies:  Allergies  Allergen Reactions  . Celecoxib Anaphylaxis and Swelling  . Fentanyl Anaphylaxis and Rash  . Ibuprofen Anaphylaxis and Rash  . Ketorolac Tromethamine Anaphylaxis  . Meperidine Hcl Anaphylaxis  . Morphine Anaphylaxis and Rash  . Ondansetron Anaphylaxis  . Shellfish Allergy Anaphylaxis    Crab and lobster  . Tramadol Anaphylaxis and Rash  . Vicodin (Hydrocodone-Acetaminophen) Rash    Patient says she's not allergic to vicodin  . Coconut Flavor Swelling    Trouble breathing Trouble breathing  .  Propoxyphene-Acetaminophen Rash  . Tylenol (Acetaminophen) Hives, Itching and Rash    Prescriptions prior to admission  Medication Sig Dispense Refill  . diphenhydrAMINE (BENADRYL) 25 MG tablet Take 25 mg by mouth every 6 (six) hours as needed for itching.      . lipase/protease/amylase (CREON-12/PANCREASE) 12000 UNITS CPEP Take 2 capsules by mouth 3 (three) times daily with meals.      . metoCLOPramide (REGLAN) 5 MG tablet Take 5 mg by mouth 3 (three) times daily as needed (for nausea).      . [DISCONTINUED] HYDROcodone-acetaminophen (NORCO/VICODIN) 5-325 MG per tablet Take 1 tablet by mouth every 6 (six) hours as needed for pain.      . [DISCONTINUED] HYDROmorphone (DILAUDID) 2 MG tablet Take 2 mg by mouth every 6 (six) hours as needed for pain.        Review of Systems  Constitutional: Negative.   Respiratory: Negative.   Cardiovascular: Negative.   Gastrointestinal: Positive for nausea, vomiting, abdominal pain and diarrhea. Negative for constipation.  Genitourinary: Negative for dysuria, urgency, frequency, hematuria and flank pain.       Negative for vaginal bleeding, vaginal discharge, dyspareunia  Musculoskeletal: Negative.   Neurological: Negative.   Psychiatric/Behavioral: Negative.    Physical Exam   Blood pressure 117/87, pulse 86, temperature 98.6 F (37 C), temperature source  Oral, resp. rate 18, height 5\' 2"  (1.575 m), weight 178 lb 3.2 oz (80.831 kg), last menstrual period 11/26/2012, SpO2 98.00%.  Physical Exam  Nursing note and vitals reviewed. Constitutional: She is oriented to person, place, and time. She appears well-developed and well-nourished. No distress.  Cardiovascular: Normal rate.   Respiratory: Effort normal.  GI: Soft. She exhibits no distension and no mass. There is no tenderness. There is no rebound and no guarding.  Musculoskeletal: Normal range of motion.  Neurological: She is alert and oriented to person, place, and time.  Skin: Skin is warm  and dry.  Psychiatric: She has a normal mood and affect.    MAU Course  Procedures  Results for orders placed during the hospital encounter of 01/01/13 (from the past 72 hour(s))  URINALYSIS, ROUTINE W REFLEX MICROSCOPIC     Status: Abnormal   Collection Time    01/01/13  5:00 PM      Result Value Range   Color, Urine YELLOW  YELLOW   APPearance CLEAR  CLEAR   Specific Gravity, Urine 1.020  1.005 - 1.030   pH 8.0  5.0 - 8.0   Glucose, UA NEGATIVE  NEGATIVE mg/dL   Hgb urine dipstick NEGATIVE  NEGATIVE   Bilirubin Urine NEGATIVE  NEGATIVE   Ketones, ur NEGATIVE  NEGATIVE mg/dL   Protein, ur NEGATIVE  NEGATIVE mg/dL   Urobilinogen, UA 2.0 (*) 0.0 - 1.0 mg/dL   Nitrite NEGATIVE  NEGATIVE   Leukocytes, UA NEGATIVE  NEGATIVE   Comment: MICROSCOPIC NOT DONE ON URINES WITH NEGATIVE PROTEIN, BLOOD, LEUKOCYTES, NITRITE, OR GLUCOSE <1000 mg/dL.  URINE RAPID DRUG SCREEN (HOSP PERFORMED)     Status: None   Collection Time    01/01/13  5:00 PM      Result Value Range   Opiates NONE DETECTED  NONE DETECTED   Cocaine NONE DETECTED  NONE DETECTED   Benzodiazepines NONE DETECTED  NONE DETECTED   Amphetamines NONE DETECTED  NONE DETECTED   Tetrahydrocannabinol NONE DETECTED  NONE DETECTED   Barbiturates NONE DETECTED  NONE DETECTED   Comment:            DRUG SCREEN FOR MEDICAL PURPOSES     ONLY.  IF CONFIRMATION IS NEEDED     FOR ANY PURPOSE, NOTIFY LAB     WITHIN 5 DAYS.                LOWEST DETECTABLE LIMITS     FOR URINE DRUG SCREEN     Drug Class       Cutoff (ng/mL)     Amphetamine      1000     Barbiturate      200     Benzodiazepine   200     Tricyclics       300     Opiates          300     Cocaine          300     THC              50  CBC     Status: None   Collection Time    01/01/13  6:55 PM      Result Value Range   WBC 9.1  4.0 - 10.5 K/uL   RBC 4.77  3.87 - 5.11 MIL/uL   Hemoglobin 13.2  12.0 - 15.0 g/dL   HCT 57.8  46.9 - 62.9 %   MCV 80.7  78.0 - 100.0 fL    MCH 27.7  26.0 - 34.0 pg   MCHC 34.3  30.0 - 36.0 g/dL   RDW 16.1  09.6 - 04.5 %   Platelets 267  150 - 400 K/uL  COMPREHENSIVE METABOLIC PANEL     Status: Abnormal   Collection Time    01/01/13  6:55 PM      Result Value Range   Sodium 137  135 - 145 mEq/L   Potassium 3.8  3.5 - 5.1 mEq/L   Chloride 101  96 - 112 mEq/L   CO2 26  19 - 32 mEq/L   Glucose, Bld 103 (*) 70 - 99 mg/dL   BUN 14  6 - 23 mg/dL   Creatinine, Ser 4.09  0.50 - 1.10 mg/dL   Calcium 9.2  8.4 - 81.1 mg/dL   Total Protein 8.0  6.0 - 8.3 g/dL   Albumin 3.8  3.5 - 5.2 g/dL   AST 36  0 - 37 U/L   ALT 55 (*) 0 - 35 U/L   Alkaline Phosphatase 87  39 - 117 U/L   Total Bilirubin 0.4  0.3 - 1.2 mg/dL   GFR calc non Af Amer >90  >90 mL/min   GFR calc Af Amer >90  >90 mL/min   Comment:            The eGFR has been calculated     using the CKD EPI equation.     This calculation has not been     validated in all clinical     situations.     eGFR's persistently     <90 mL/min signify     possible Chronic Kidney Disease.   Pt was offered Phenergan IM as she states she cannot tolerated PO meds at this time - refused injection. Accepted PO oxycodone (states she is allergic to tylenol), but then spit it back up. Requested injection of dilaudid. I discussed with her that we typically reserve dilaudid for severe pain and that I do not feel it is appropriate at this time as she does not appear to be in any distress. We also discussed that she was not dehydrated, which is inconsistent with her report of not being able to keep anything down for several days. She states she thinks she could tolerate a liquid medication and that she could probably get her n/v under control with meds at home "if I could just get this pain under control." I will give her a few doses of some liquid form of pain medicine tonight. She needs to f/u with her PCP for ongoing management.   Assessment and Plan   1. Nausea vomiting, diarrhea & abdominal pain    2. PANCREATITIS, CHRONIC   3. Abdominal pain, left upper quadrant    Pt was offered Phenergan IM as she states she cannot tolerated PO meds at this time - refused injection. Accepted PO oxycodone (states she is allergic to tylenol), but then spit it back up. Requested injection of dilaudid. I discussed with her that we typically reserve dilaudid for severe pain and that I do not feel it is appropriate at this time as she does not appear to be in any distress. We also discussed that she was not dehydrated, which is inconsistent with her report of not being able to keep anything down for several days. She states she thinks she could tolerate a liquid medication and that she could probably get her n/v under control with meds  at home "if I could just get this pain under control." I will give her a few doses of some liquid form of pain medicine tonight. She needs to f/u with her PCP for ongoing management.     Medication List    STOP taking these medications       HYDROcodone-acetaminophen 5-325 MG per tablet  Commonly known as:  NORCO/VICODIN     HYDROmorphone 2 MG tablet  Commonly known as:  DILAUDID      TAKE these medications       diphenhydrAMINE 25 MG tablet  Commonly known as:  BENADRYL  Take 25 mg by mouth every 6 (six) hours as needed for itching.     lipase/protease/amylase 16109 UNITS Cpep  Commonly known as:  CREON-12/PANCREASE  Take 2 capsules by mouth 3 (three) times daily with meals.     metoCLOPramide 5 MG tablet  Commonly known as:  REGLAN  Take 5 mg by mouth 3 (three) times daily as needed (for nausea).            Follow-up Information   Follow up with Ignacia Bayley, MD.   Contact information:   [address] [place] [address] [phone]         Avaneesh Pepitone 01/01/2013, 6:40 PM

## 2013-01-01 NOTE — Progress Notes (Signed)
Patient reports to MAU stating that Rx from earlier today for Codeine Sulfate Solution is not available at 4 area pharmacies. Discussed patient allergies with PharmD and suggests trying percocet as patient has previously received this in our system without note of reaction. Patient confirms no reaction to percocet in the past. Rx written for #15 and given to patient.

## 2013-01-01 NOTE — MAU Note (Signed)
Patient states she has had pancreatitis for about 9 years. Has had nausea, vomiting, diarrhea and abdominal pain for about 5 weeks. States the medication she has been given is not working. States she also has fibroids and ovarian cysts.

## 2013-01-01 NOTE — MAU Note (Signed)
Patient states she had a hysterectomy in 2005 but states she has a period every month.

## 2013-01-04 NOTE — MAU Provider Note (Signed)
Attestation of Attending Supervision of Advanced Practitioner (CNM/NP): Evaluation and management procedures were performed by the Advanced Practitioner under my supervision and collaboration.  I have reviewed the Advanced Practitioner's note and chart, and I agree with the management and plan.  Earnest Mcgillis 01/04/2013 7:35 AM

## 2013-01-07 ENCOUNTER — Encounter (HOSPITAL_COMMUNITY): Payer: Self-pay | Admitting: Emergency Medicine

## 2013-01-07 ENCOUNTER — Emergency Department (HOSPITAL_COMMUNITY)
Admission: EM | Admit: 2013-01-07 | Discharge: 2013-01-07 | Disposition: A | Discharge status: Home / Self Care | Payer: Medicaid Other | Attending: Emergency Medicine | Admitting: Emergency Medicine

## 2013-01-07 DIAGNOSIS — Z862 Personal history of diseases of the blood and blood-forming organs and certain disorders involving the immune mechanism: Secondary | ICD-10-CM | POA: Insufficient documentation

## 2013-01-07 DIAGNOSIS — G8929 Other chronic pain: Secondary | ICD-10-CM

## 2013-01-07 DIAGNOSIS — Z79899 Other long term (current) drug therapy: Secondary | ICD-10-CM | POA: Insufficient documentation

## 2013-01-07 DIAGNOSIS — F17200 Nicotine dependence, unspecified, uncomplicated: Secondary | ICD-10-CM | POA: Insufficient documentation

## 2013-01-07 DIAGNOSIS — Z8742 Personal history of other diseases of the female genital tract: Secondary | ICD-10-CM | POA: Insufficient documentation

## 2013-01-07 DIAGNOSIS — K861 Other chronic pancreatitis: Secondary | ICD-10-CM | POA: Insufficient documentation

## 2013-01-07 LAB — CBC
Hemoglobin: 12.7 g/dL (ref 12.0–15.0)
MCH: 28 pg (ref 26.0–34.0)
MCHC: 34.6 g/dL (ref 30.0–36.0)
Platelets: 272 10*3/uL (ref 150–400)
RDW: 12.4 % (ref 11.5–15.5)

## 2013-01-07 LAB — COMPREHENSIVE METABOLIC PANEL
ALT: 23 U/L (ref 0–35)
AST: 24 U/L (ref 0–37)
Albumin: 3.5 g/dL (ref 3.5–5.2)
Alkaline Phosphatase: 70 U/L (ref 39–117)
Calcium: 8.9 mg/dL (ref 8.4–10.5)
Potassium: 4.2 mEq/L (ref 3.5–5.1)
Sodium: 135 mEq/L (ref 135–145)
Total Protein: 7.3 g/dL (ref 6.0–8.3)

## 2013-01-07 MED ORDER — HYDROCODONE-ACETAMINOPHEN 5-325 MG PO TABS
1.0000 | ORAL_TABLET | Freq: Once | ORAL | Status: DC
  Filled 2013-01-07: qty 1

## 2013-01-07 MED ORDER — METOCLOPRAMIDE HCL 5 MG/ML IJ SOLN
10.0000 mg | Freq: Once | INTRAMUSCULAR | Status: DC
  Filled 2013-01-07: qty 2

## 2013-01-07 NOTE — ED Notes (Signed)
Pt instructed to follow up with PCP for pain management. Pt refused to sign esignature

## 2013-01-07 NOTE — ED Provider Notes (Signed)
History    CSN: [id number] Arrival date & time 01/07/13  0059  First MD Initiated Contact with Patient 01/07/13 0142     Chief Complaint  Patient presents with  . Abdominal Pain   HPI  History provided by the patient and multiple recent charts. Patient is a 32 year old female with chronic abdominal pain with chronic pancreatitis who has frequent multiple urgency room of visits presenting with same chronic pains. Symptoms are unchanged and in persistent since her last emergency room visit one week ago. She reports occasional episodes of nausea vomiting. She states she feels dehydrated and has significant pain. Denies any other changes in symptoms. Denies any weight loss. No recent fever, chills or sweats. No urinary changes. She has same soft chronic diarrhea type stools. She was not able to see any of her primary doctors or specialist between her last emergency room visit and now. No other associated symptoms no other aggravating or alleviating factors.    Past Medical History  Diagnosis Date  . Pancreatitis   . S/P laparoscopic cholecystectomy   . Fibroids   . Ovarian cyst   . Chronic abdominal pain   . Anemia    Past Surgical History  Procedure Laterality Date  . Cholecystectomy    . Dilation and curettage of uterus    . Ercp w/ metal stent placement     Family History  Problem Relation Age of Onset  . Diabetes Mother   . Hypertension Mother   . Hypertension Father   . Diabetes Father   . Asthma Daughter    History  Substance Use Topics  . Smoking status: Current Every Day Smoker -- 0.25 packs/day for 10 years    Types: Cigarettes    Last Attempt to Quit: 09/01/2012  . Smokeless tobacco: Never Used  . Alcohol Use: No   OB History   Grav Para Term Preterm Abortions TAB SAB Ect Mult Living   4 2 1 1 2  2         Review of Systems  Constitutional: Negative for fever, chills and diaphoresis.  Gastrointestinal: Positive for nausea, vomiting, abdominal pain and  diarrhea.  All other systems reviewed and are negative.    Allergies  Celecoxib; Fentanyl; Ibuprofen; Ketorolac tromethamine; Meperidine hcl; Morphine; Ondansetron; Other; Shellfish allergy; Tramadol; Vicodin; Coconut flavor; Propoxyphene-acetaminophen; and Tylenol  Home Medications   Current Outpatient Rx  Name  Route  Sig  Dispense  Refill  . lipase/protease/amylase (CREON-12/PANCREASE) 12000 UNITS CPEP   Oral   Take 2 capsules by mouth 3 (three) times daily with meals.         . diphenhydrAMINE (BENADRYL) 25 MG tablet   Oral   Take 25 mg by mouth every 6 (six) hours as needed for itching.         . metoCLOPramide (REGLAN) 5 MG tablet   Oral   Take 5 mg by mouth 3 (three) times daily as needed (for nausea).          BP 139/72  Pulse 93  Temp(Src) 98.7 F (37.1 C) (Oral)  Resp 18  SpO2 99%  LMP 11/26/2012 Physical Exam  Nursing note and vitals reviewed. Constitutional: She is oriented to person, place, and time. She appears well-developed and well-nourished. No distress.  HENT:  Head: Normocephalic.  Mouth/Throat: Oropharynx is clear and moist.  Cardiovascular: Normal rate and regular rhythm.   Pulmonary/Chest: Effort normal and breath sounds normal. No respiratory distress. She has no wheezes. She has no rales.  Abdominal:  Soft. There is no hepatosplenomegaly. There is tenderness in the left upper quadrant. There is no rigidity, no rebound, no guarding and no CVA tenderness.  Musculoskeletal: Normal range of motion.  Neurological: She is alert and oriented to person, place, and time.  Skin: Skin is warm and dry. No rash noted.  Psychiatric: She has a normal mood and affect. Her behavior is normal.    ED Course  Procedures   Results for orders placed during the hospital encounter of 01/07/13  CBC      Result Value Range   WBC 7.3  4.0 - 10.5 K/uL   RBC 4.53  3.87 - 5.11 MIL/uL   Hemoglobin 12.7  12.0 - 15.0 g/dL   HCT 04.5  40.9 - 81.1 %   MCV 81.0   78.0 - 100.0 fL   MCH 28.0  26.0 - 34.0 pg   MCHC 34.6  30.0 - 36.0 g/dL   RDW 91.4  78.2 - 95.6 %   Platelets 272  150 - 400 K/uL  COMPREHENSIVE METABOLIC PANEL      Result Value Range   Sodium 135  135 - 145 mEq/L   Potassium 4.2  3.5 - 5.1 mEq/L   Chloride 103  96 - 112 mEq/L   CO2 23  19 - 32 mEq/L   Glucose, Bld 99  70 - 99 mg/dL   BUN 15  6 - 23 mg/dL   Creatinine, Ser 2.13  0.50 - 1.10 mg/dL   Calcium 8.9  8.4 - 08.6 mg/dL   Total Protein 7.3  6.0 - 8.3 g/dL   Albumin 3.5  3.5 - 5.2 g/dL   AST 24  0 - 37 U/L   ALT 23  0 - 35 U/L   Alkaline Phosphatase 70  39 - 117 U/L   Total Bilirubin 0.1 (*) 0.3 - 1.2 mg/dL   GFR calc non Af Amer >90  >90 mL/min   GFR calc Af Amer >90  >90 mL/min  LIPASE, BLOOD      Result Value Range   Lipase 45  11 - 59 U/L       1. Chronic pain        MDM  patient seen and evaluated. The patient does not appear in any acute distress.  Patient with chronic symptoms. Unchanged. Unremarkable lab testing. Afebrile with normal vital signs and heart rate. We'll discharge for her doctor followup  Angus Seller, PA-C 01/07/13 5784

## 2013-01-07 NOTE — ED Provider Notes (Signed)
Medical screening examination/treatment/procedure(s) were performed by non-physician practitioner and as supervising physician I was immediately available for consultation/collaboration.    Vida Roller, MD 01/07/13 [phone]

## 2013-01-07 NOTE — ED Notes (Signed)
Pt was told that she could have something to drink for her fluid challenge but she refused an oral beverage.

## 2013-01-07 NOTE — ED Notes (Signed)
PT. REPORTS CHRONIC GENERALIZED ABDOMINAL PAIN WITH NAUSEA AND HEADACHE FOR SEVERAL YEARS.

## 2013-01-14 ENCOUNTER — Emergency Department (HOSPITAL_COMMUNITY)
Admission: EM | Admit: 2013-01-14 | Discharge: 2013-01-14 | Disposition: A | Discharge status: Home / Self Care | Payer: Medicaid Other | Attending: Emergency Medicine | Admitting: Emergency Medicine

## 2013-01-14 ENCOUNTER — Encounter (HOSPITAL_COMMUNITY): Payer: Self-pay | Admitting: *Deleted

## 2013-01-14 DIAGNOSIS — Z79899 Other long term (current) drug therapy: Secondary | ICD-10-CM | POA: Insufficient documentation

## 2013-01-14 DIAGNOSIS — Z8719 Personal history of other diseases of the digestive system: Secondary | ICD-10-CM | POA: Insufficient documentation

## 2013-01-14 DIAGNOSIS — R112 Nausea with vomiting, unspecified: Secondary | ICD-10-CM | POA: Insufficient documentation

## 2013-01-14 DIAGNOSIS — Z9089 Acquired absence of other organs: Secondary | ICD-10-CM | POA: Insufficient documentation

## 2013-01-14 DIAGNOSIS — G8929 Other chronic pain: Secondary | ICD-10-CM

## 2013-01-14 DIAGNOSIS — Z87891 Personal history of nicotine dependence: Secondary | ICD-10-CM | POA: Insufficient documentation

## 2013-01-14 DIAGNOSIS — Z8742 Personal history of other diseases of the female genital tract: Secondary | ICD-10-CM | POA: Insufficient documentation

## 2013-01-14 DIAGNOSIS — R197 Diarrhea, unspecified: Secondary | ICD-10-CM | POA: Insufficient documentation

## 2013-01-14 DIAGNOSIS — Z862 Personal history of diseases of the blood and blood-forming organs and certain disorders involving the immune mechanism: Secondary | ICD-10-CM | POA: Insufficient documentation

## 2013-01-14 DIAGNOSIS — R109 Unspecified abdominal pain: Secondary | ICD-10-CM | POA: Insufficient documentation

## 2013-01-14 LAB — COMPREHENSIVE METABOLIC PANEL
Albumin: 3.6 g/dL (ref 3.5–5.2)
Alkaline Phosphatase: 75 U/L (ref 39–117)
BUN: 11 mg/dL (ref 6–23)
Chloride: 106 mEq/L (ref 96–112)
Creatinine, Ser: 0.68 mg/dL (ref 0.50–1.10)
GFR calc Af Amer: >90 mL/min (ref >90)
Glucose, Bld: 93 mg/dL (ref 70–99)
Potassium: 4.1 mEq/L (ref 3.5–5.1)
Total Bilirubin: 0.2 mg/dL — ABNORMAL LOW (ref 0.3–1.2)

## 2013-01-14 LAB — CBC WITH DIFFERENTIAL/PLATELET
Basophils Relative: 0 % (ref 0–1)
Eosinophils Absolute: 0.4 10*3/uL (ref 0.0–0.7)
HCT: 36.1 % (ref 36.0–46.0)
Hemoglobin: 12.6 g/dL (ref 12.0–15.0)
Lymphs Abs: 3.1 10*3/uL (ref 0.7–4.0)
MCH: 28.2 pg (ref 26.0–34.0)
MCHC: 34.9 g/dL (ref 30.0–36.0)
Monocytes Absolute: 0.6 10*3/uL (ref 0.1–1.0)
Monocytes Relative: 8 % (ref 3–12)
Neutro Abs: 3.1 10*3/uL (ref 1.7–7.7)
Neutrophils Relative %: 43 % (ref 43–77)
RBC: 4.47 MIL/uL (ref 3.87–5.11)

## 2013-01-14 LAB — LIPASE, BLOOD: Lipase: 55 U/L (ref 11–59)

## 2013-01-14 MED ORDER — PROMETHAZINE HCL 25 MG/ML IJ SOLN
25.0000 mg | Freq: Once | INTRAMUSCULAR | Status: DC
  Filled 2013-01-14: qty 1

## 2013-01-14 NOTE — ED Notes (Signed)
Pt is here with LUQ pain from pancreatitis and lower back pain.  Pt reports vomiting and diarrhea

## 2013-01-14 NOTE — ED Notes (Signed)
PT instructed to collect urine but has not.

## 2013-01-14 NOTE — ED Provider Notes (Signed)
History    CSN: [id number] Arrival date & time 01/14/13  4098  First MD Initiated Contact with Patient 01/14/13 2008     Chief Complaint  Patient presents with  . Pancreatitis   HPI  History provided by the patient. Patient is a 32 year old female with history of chronic abdominal pain and chronic pancreatitis presenting with similar chronic symptoms. She was last seen in the emergency room 6 days ago for similar symptoms. She states symptoms have not improved over the past week. Have not worsened. They are associated with frequent soft loose stools and occasional nausea vomiting. Patient is prescribed hydrocodone 7.5 mg per chronic pains has been using this without improvement at home. She was able to go to church today he states pain is just too unbearable at home and has made her "feel bad".she denies any associated fever, chills or sweats. No urinary changes. No dysuria hematuria or urinary frequency. No menstrual changes. Last mental period ended 3 days ago. No vaginal bleeding or discharge.      Past Medical History  Diagnosis Date  . Pancreatitis   . S/P laparoscopic cholecystectomy   . Fibroids   . Ovarian cyst   . Chronic abdominal pain   . Anemia    Past Surgical History  Procedure Laterality Date  . Cholecystectomy    . Dilation and curettage of uterus    . Ercp w/ metal stent placement     Family History  Problem Relation Age of Onset  . Diabetes Mother   . Hypertension Mother   . Hypertension Father   . Diabetes Father   . Asthma Daughter    History  Substance Use Topics  . Smoking status: Former Smoker -- 0.25 packs/day for 10 years    Types: Cigarettes    Quit date: 09/01/2012  . Smokeless tobacco: Never Used  . Alcohol Use: No   OB History   Grav Para Term Preterm Abortions TAB SAB Ect Mult Living   4 2 1 1 2  2         Review of Systems  Constitutional: Negative for fever, chills and diaphoresis.  Gastrointestinal: Positive for nausea,  vomiting, abdominal pain and diarrhea. Negative for constipation and blood in stool.  Genitourinary: Negative for dysuria, frequency, hematuria, flank pain, vaginal bleeding, vaginal discharge and menstrual problem.  All other systems reviewed and are negative.    Allergies  Celecoxib; Fentanyl; Ibuprofen; Ketorolac tromethamine; Meperidine hcl; Morphine; Ondansetron; Other; Shellfish allergy; Tramadol; Vicodin; Coconut flavor; Propoxyphene-acetaminophen; and Tylenol  Home Medications   Current Outpatient Rx  Name  Route  Sig  Dispense  Refill  . diphenhydrAMINE (BENADRYL) 25 MG tablet   Oral   Take 25 mg by mouth every 6 (six) hours as needed for itching.         . estradiol (ESTRACE) 2 MG tablet   Oral   Take 2 mg by mouth at bedtime.         Marland Kitchen HYDROcodone-acetaminophen (NORCO) 7.5-325 MG per tablet   Oral   Take 1 tablet by mouth every 8 (eight) hours as needed for pain.         Marland Kitchen lipase/protease/amylase (CREON-12/PANCREASE) 12000 UNITS CPEP   Oral   Take 2 capsules by mouth 3 (three) times daily with meals.         . metoCLOPramide (REGLAN) 5 MG tablet   Oral   Take 5 mg by mouth 3 (three) times daily as needed (for nausea).  BP 112/75  Pulse 91  Temp(Src) 98.3 F (36.8 C) (Oral)  Resp 18  SpO2 94%  LMP 01/08/2013 Physical Exam  Nursing note and vitals reviewed. Constitutional: She is oriented to person, place, and time. She appears well-developed and well-nourished. No distress.  HENT:  Head: Normocephalic.  Mouth/Throat: Oropharynx is clear and moist.  Cardiovascular: Normal rate and regular rhythm.   Pulmonary/Chest: Effort normal and breath sounds normal. No respiratory distress. She has no wheezes. She has no rales.  Abdominal: Soft. There is no hepatosplenomegaly. There is tenderness in the right lower quadrant and left upper quadrant. There is no rigidity, no rebound, no guarding and no CVA tenderness.    Musculoskeletal: Normal range  of motion. She exhibits no edema.  Neurological: She is alert and oriented to person, place, and time.  Skin: Skin is warm and dry. No rash noted.  Psychiatric: She has a normal mood and affect. Her behavior is normal.    ED Course  Procedures   Results for orders placed during the hospital encounter of 01/14/13  CBC WITH DIFFERENTIAL      Result Value Range   WBC 7.2  4.0 - 10.5 K/uL   RBC 4.47  3.87 - 5.11 MIL/uL   Hemoglobin 12.6  12.0 - 15.0 g/dL   HCT 16.1  09.6 - 04.5 %   MCV 80.8  78.0 - 100.0 fL   MCH 28.2  26.0 - 34.0 pg   MCHC 34.9  30.0 - 36.0 g/dL   RDW 40.9  81.1 - 91.4 %   Platelets 260  150 - 400 K/uL   Neutrophils Relative % 43  43 - 77 %   Neutro Abs 3.1  1.7 - 7.7 K/uL   Lymphocytes Relative 43  12 - 46 %   Lymphs Abs 3.1  0.7 - 4.0 K/uL   Monocytes Relative 8  3 - 12 %   Monocytes Absolute 0.6  0.1 - 1.0 K/uL   Eosinophils Relative 5  0 - 5 %   Eosinophils Absolute 0.4  0.0 - 0.7 K/uL   Basophils Relative 0  0 - 1 %   Basophils Absolute 0.0  0.0 - 0.1 K/uL  COMPREHENSIVE METABOLIC PANEL      Result Value Range   Sodium 139  135 - 145 mEq/L   Potassium 4.1  3.5 - 5.1 mEq/L   Chloride 106  96 - 112 mEq/L   CO2 24  19 - 32 mEq/L   Glucose, Bld 93  70 - 99 mg/dL   BUN 11  6 - 23 mg/dL   Creatinine, Ser 7.82  0.50 - 1.10 mg/dL   Calcium 8.6  8.4 - 95.6 mg/dL   Total Protein 7.4  6.0 - 8.3 g/dL   Albumin 3.6  3.5 - 5.2 g/dL   AST 25  0 - 37 U/L   ALT 33  0 - 35 U/L   Alkaline Phosphatase 75  39 - 117 U/L   Total Bilirubin 0.2 (*) 0.3 - 1.2 mg/dL   GFR calc non Af Amer >90  >90 mL/min   GFR calc Af Amer >90  >90 mL/min  LIPASE, BLOOD      Result Value Range   Lipase 55  11 - 59 U/L       1. Chronic abdominal pain        MDM  8:10PM Pt seen and evaluated.  Pt appears well in no acute distress or significant discomfort.  Pt without any changes  to her chronic pains. Patient states that she is seeing a new primary care provider at [facility], Dr. Brendia Sacks.  Patient appears well. She does not appear dehydrated. She has good outpatient followup for her chronic symptoms. Patient was offered medications for her nausea but has refused. Labs today unremarkable without any concerning signs for an emergent condition. She will be able to followup with her doctors for continued chronic pain management.   Angus Seller, PA-C 01/14/13 2057

## 2013-01-14 NOTE — ED Provider Notes (Signed)
Medical screening examination/treatment/procedure(s) were performed by non-physician practitioner and as supervising physician I was immediately available for consultation/collaboration.   Lyanne Co, MD 01/14/13 2126

## 2013-01-19 ENCOUNTER — Emergency Department (HOSPITAL_COMMUNITY)
Admission: EM | Admit: 2013-01-19 | Discharge: 2013-01-19 | Disposition: A | Discharge status: Home / Self Care | Payer: Medicaid Other | Attending: Emergency Medicine | Admitting: Emergency Medicine

## 2013-01-19 ENCOUNTER — Encounter (HOSPITAL_COMMUNITY): Payer: Self-pay | Admitting: *Deleted

## 2013-01-19 ENCOUNTER — Emergency Department (HOSPITAL_COMMUNITY): Payer: Medicaid Other

## 2013-01-19 DIAGNOSIS — Z79899 Other long term (current) drug therapy: Secondary | ICD-10-CM | POA: Insufficient documentation

## 2013-01-19 DIAGNOSIS — W010XXA Fall on same level from slipping, tripping and stumbling without subsequent striking against object, initial encounter: Secondary | ICD-10-CM | POA: Insufficient documentation

## 2013-01-19 DIAGNOSIS — S8990XA Unspecified injury of unspecified lower leg, initial encounter: Secondary | ICD-10-CM | POA: Insufficient documentation

## 2013-01-19 DIAGNOSIS — Z8742 Personal history of other diseases of the female genital tract: Secondary | ICD-10-CM | POA: Insufficient documentation

## 2013-01-19 DIAGNOSIS — S99919A Unspecified injury of unspecified ankle, initial encounter: Secondary | ICD-10-CM | POA: Insufficient documentation

## 2013-01-19 DIAGNOSIS — M25572 Pain in left ankle and joints of left foot: Secondary | ICD-10-CM

## 2013-01-19 DIAGNOSIS — Z87891 Personal history of nicotine dependence: Secondary | ICD-10-CM | POA: Insufficient documentation

## 2013-01-19 DIAGNOSIS — Z862 Personal history of diseases of the blood and blood-forming organs and certain disorders involving the immune mechanism: Secondary | ICD-10-CM | POA: Insufficient documentation

## 2013-01-19 DIAGNOSIS — Y9229 Other specified public building as the place of occurrence of the external cause: Secondary | ICD-10-CM | POA: Insufficient documentation

## 2013-01-19 DIAGNOSIS — Z8719 Personal history of other diseases of the digestive system: Secondary | ICD-10-CM | POA: Insufficient documentation

## 2013-01-19 DIAGNOSIS — Y9389 Activity, other specified: Secondary | ICD-10-CM | POA: Insufficient documentation

## 2013-01-19 NOTE — ED Notes (Signed)
Reports falling and hurting left ankle today. No injury or swelling noted.

## 2013-01-19 NOTE — ED Notes (Signed)
Patient transported to X-ray 

## 2013-01-19 NOTE — Progress Notes (Signed)
Orthopedic Tech Progress Note Patient Details:  [name] September 20, 1980 [id number] Ankle ASO applied to left LE. Crutches fit for height and comfort. Paitent demonstrated proper crutch use.  Ortho Devices Type of Ortho Device: ASO;Crutches Ortho Device/Splint Location: Left LE Ortho Device/Splint Interventions: Application   Asia R Thompson 01/19/2013, 2:29 PM

## 2013-01-19 NOTE — ED Provider Notes (Signed)
History    CSN: [id number] Arrival date & time 01/19/13  1140  First MD Initiated Contact with Patient 01/19/13 1331     Chief Complaint  Patient presents with  . Ankle Pain   (Consider location/radiation/quality/duration/timing/severity/associated sxs/prior Treatment) Patient is a 32 y.o. female presenting with ankle pain. The history is provided by the patient. No language interpreter was used.  Ankle Pain Location:  Ankle Associated symptoms: no neck pain   Associated symptoms comment:  She tripped and fell while shopping in [employer] causing left ankle injury.  Past Medical History  Diagnosis Date  . Pancreatitis   . S/P laparoscopic cholecystectomy   . Fibroids   . Ovarian cyst   . Chronic abdominal pain   . Anemia    Past Surgical History  Procedure Laterality Date  . Cholecystectomy    . Dilation and curettage of uterus    . Ercp w/ metal stent placement     Family History  Problem Relation Age of Onset  . Diabetes Mother   . Hypertension Mother   . Hypertension Father   . Diabetes Father   . Asthma Daughter    History  Substance Use Topics  . Smoking status: Former Smoker -- 0.25 packs/day for 10 years    Types: Cigarettes    Quit date: 09/01/2012  . Smokeless tobacco: Never Used  . Alcohol Use: No   OB History   Grav Para Term Preterm Abortions TAB SAB Ect Mult Living   4 2 1 1 2  2         Review of Systems  Constitutional: Negative.   HENT: Negative.  Negative for neck pain.   Gastrointestinal: Negative.  Negative for abdominal pain.  Musculoskeletal:       See HPI.  Skin: Negative.  Negative for wound.  Neurological: Negative.  Negative for numbness.    Allergies  Celecoxib; Fentanyl; Ibuprofen; Ketorolac tromethamine; Meperidine hcl; Morphine; Ondansetron; Other; Shellfish allergy; Tramadol; Vicodin; Coconut flavor; Propoxyphene-acetaminophen; and Tylenol  Home Medications   Current Outpatient Rx  Name  Route  Sig  Dispense  Refill  .  diphenhydrAMINE (BENADRYL) 25 MG tablet   Oral   Take 25 mg by mouth every 6 (six) hours as needed for itching.         . estradiol (ESTRACE) 2 MG tablet   Oral   Take 2 mg by mouth at bedtime.         Marland Kitchen HYDROcodone-acetaminophen (NORCO) 7.5-325 MG per tablet   Oral   Take 1 tablet by mouth every 8 (eight) hours as needed for pain.          BP 124/68  Pulse 89  Temp(Src) 98.7 F (37.1 C) (Oral)  Resp 18  SpO2 99%  LMP 07/05/2004 Physical Exam  Constitutional: She is oriented to person, place, and time. She appears well-developed and well-nourished.  Neck: Normal range of motion.  Pulmonary/Chest: Effort normal.  Musculoskeletal:  No significant swelling of left ankle. No discoloration or bony deformity. Joint stable. Generally tender. Foot unremarkable in appearance, also generally tender.   Neurological: She is alert and oriented to person, place, and time.  Skin: Skin is warm and dry.    ED Course  Procedures (including critical care time) Labs Reviewed - No data to display Dg Ankle Complete Left  01/19/2013   *RADIOLOGY REPORT*  Clinical Data: Post fall, twisting left ankle, now with diffuse ankle pain  LEFT ANKLE COMPLETE - 3+ VIEW  Comparison: None.  Findings:  No fracture or dislocation.  The ankle mortise is preserved.  No ankle joint effusion.  Regional soft tissues are normal.  No radiopaque foreign body.  IMPRESSION: No fracture or dislocation.   Original Report Authenticated By: Tacey Ruiz, MD   No diagnosis found. 1. Ankle pain  MDM  Uncomplicated ankle injury  Arnoldo Hooker, PA-C 01/19/13 1417

## 2013-01-19 NOTE — ED Notes (Signed)
Ortho paged for crutches and ankle immobilizer.

## 2013-01-20 NOTE — ED Provider Notes (Signed)
Medical screening examination/treatment/procedure(s) were performed by non-physician practitioner and as supervising physician I was immediately available for consultation/collaboration.   Zaahir Pickney III, MD 01/20/13 1045 

## 2013-01-27 ENCOUNTER — Emergency Department (HOSPITAL_COMMUNITY)
Admission: EM | Admit: 2013-01-27 | Discharge: 2013-01-27 | Disposition: A | Discharge status: Home / Self Care | Payer: Medicaid Other | Attending: Emergency Medicine | Admitting: Emergency Medicine

## 2013-01-27 ENCOUNTER — Encounter (HOSPITAL_COMMUNITY): Payer: Self-pay | Admitting: Adult Health

## 2013-01-27 DIAGNOSIS — R109 Unspecified abdominal pain: Secondary | ICD-10-CM

## 2013-01-27 DIAGNOSIS — R197 Diarrhea, unspecified: Secondary | ICD-10-CM | POA: Insufficient documentation

## 2013-01-27 DIAGNOSIS — Z79899 Other long term (current) drug therapy: Secondary | ICD-10-CM | POA: Insufficient documentation

## 2013-01-27 DIAGNOSIS — Z87891 Personal history of nicotine dependence: Secondary | ICD-10-CM | POA: Insufficient documentation

## 2013-01-27 DIAGNOSIS — G8929 Other chronic pain: Secondary | ICD-10-CM | POA: Insufficient documentation

## 2013-01-27 DIAGNOSIS — R110 Nausea: Secondary | ICD-10-CM | POA: Insufficient documentation

## 2013-01-27 DIAGNOSIS — Z8742 Personal history of other diseases of the female genital tract: Secondary | ICD-10-CM | POA: Insufficient documentation

## 2013-01-27 DIAGNOSIS — Z8719 Personal history of other diseases of the digestive system: Secondary | ICD-10-CM | POA: Insufficient documentation

## 2013-01-27 DIAGNOSIS — R1013 Epigastric pain: Secondary | ICD-10-CM | POA: Insufficient documentation

## 2013-01-27 DIAGNOSIS — Z862 Personal history of diseases of the blood and blood-forming organs and certain disorders involving the immune mechanism: Secondary | ICD-10-CM | POA: Insufficient documentation

## 2013-01-27 LAB — CBC WITH DIFFERENTIAL/PLATELET
Basophils Absolute: 0 10*3/uL (ref 0.0–0.1)
Eosinophils Absolute: 0.5 10*3/uL (ref 0.0–0.7)
Eosinophils Relative: 7 % — ABNORMAL HIGH (ref 0–5)
HCT: 36 % (ref 36.0–46.0)
Lymphocytes Relative: 38 % (ref 12–46)
Lymphs Abs: 2.6 10*3/uL (ref 0.7–4.0)
MCH: 27.8 pg (ref 26.0–34.0)
MCV: 82 fL (ref 78.0–100.0)
Monocytes Absolute: 0.6 10*3/uL (ref 0.1–1.0)
Platelets: 224 10*3/uL (ref 150–400)
RDW: 12.2 % (ref 11.5–15.5)
WBC: 6.8 10*3/uL (ref 4.0–10.5)

## 2013-01-27 LAB — COMPREHENSIVE METABOLIC PANEL
CO2: 22 mEq/L (ref 19–32)
Calcium: 9.2 mg/dL (ref 8.4–10.5)
Creatinine, Ser: 0.6 mg/dL (ref 0.50–1.10)
GFR calc Af Amer: >90 mL/min (ref >90)
GFR calc non Af Amer: >90 mL/min (ref >90)
Glucose, Bld: 106 mg/dL — ABNORMAL HIGH (ref 70–99)
Total Protein: 7.4 g/dL (ref 6.0–8.3)

## 2013-01-27 LAB — LIPASE, BLOOD: Lipase: 39 U/L (ref 11–59)

## 2013-01-27 MED ORDER — SODIUM CHLORIDE 0.9 % IV BOLUS (SEPSIS)
1000.0000 mL | Freq: Once | INTRAVENOUS | Status: AC
  Administered 2013-01-27: 1000 mL via INTRAVENOUS

## 2013-01-27 MED ORDER — DIPHENHYDRAMINE HCL 50 MG/ML IJ SOLN
25.0000 mg | Freq: Once | INTRAMUSCULAR | Status: AC
  Administered 2013-01-27: 25 mg via INTRAVENOUS
  Filled 2013-01-27: qty 1

## 2013-01-27 MED ORDER — OXYCODONE-ACETAMINOPHEN 5-325 MG PO TABS
1.0000 | ORAL_TABLET | Freq: Once | ORAL | Status: AC
  Administered 2013-01-27: 1 via ORAL
  Filled 2013-01-27: qty 1

## 2013-01-27 MED ORDER — PROCHLORPERAZINE EDISYLATE 5 MG/ML IJ SOLN
10.0000 mg | Freq: Four times a day (QID) | INTRAMUSCULAR | Status: DC | PRN
  Administered 2013-01-27: 10 mg via INTRAVENOUS
  Filled 2013-01-27: qty 2

## 2013-01-27 MED ORDER — HYDROMORPHONE HCL PF 1 MG/ML IJ SOLN
1.0000 mg | Freq: Once | INTRAMUSCULAR | Status: AC
  Administered 2013-01-27: 1 mg via INTRAVENOUS
  Filled 2013-01-27: qty 1

## 2013-01-27 MED ORDER — KETAMINE HCL 10 MG/ML IJ SOLN
8.0000 mg | Freq: Once | INTRAMUSCULAR | Status: AC
  Administered 2013-01-27: 8 mg via INTRAVENOUS
  Filled 2013-01-27: qty 0.8

## 2013-01-27 MED ORDER — HYDROMORPHONE HCL PF 1 MG/ML IJ SOLN: 1.0000 mg | Freq: Once | INTRAMUSCULAR | Status: DC

## 2013-01-27 MED ORDER — OXYCODONE-ACETAMINOPHEN 5-325 MG PO TABS: 1.0000 | ORAL_TABLET | Freq: Once | ORAL | Status: DC

## 2013-01-27 NOTE — ED Notes (Signed)
Lab at BS 

## 2013-01-27 NOTE — ED Notes (Signed)
IV team at BS 

## 2013-01-27 NOTE — ED Notes (Signed)
Presents with 5 days of upper abdominal pain associated with nausea, vomiting.

## 2013-01-27 NOTE — ED Notes (Signed)
C/o diffuse abd pain, describes as usual pancreatitis flare up, also reports nausea and diarrhea, last BM here in ED, (watery, brown, no obvious blood), last ate soup & crackers in there afternoon (not much), (denies: chest or back pain, fever, bleeding or vomiting). Denies ETOH.

## 2013-01-27 NOTE — ED Notes (Signed)
EDP in to see pt, pending lab results.

## 2013-01-27 NOTE — ED Notes (Signed)
Lab back at [facility], pt refused initially wanting blood to be drawn with IV start.

## 2013-01-27 NOTE — ED Notes (Signed)
Attempted IV x 2, unsuccessful. IV team paged. 

## 2013-01-27 NOTE — ED Notes (Signed)
IV team returned page, will see.

## 2013-01-27 NOTE — ED Notes (Addendum)
Pt seen by EDP prior to RN assessment, see MD notes, orders received and initiated, pt ambulatory with limp to b/r, back to stretcher w/o incident or change, lab at [facility], pt alert, NAD, calm, interactive, resps e/u, speaking in clear complete sentences. family present.

## 2013-01-27 NOTE — ED Provider Notes (Signed)
CSN: [id number]     Arrival date & time 01/27/13  0225 History     First MD Initiated Contact with Patient 01/27/13 0305     Chief Complaint  Patient presents with  . Abdominal Pain   HPI [name] is a 32 y.o. female with a history of chronic epigastric abdominal pain, whom I am very familiar with having seen her previously in the emergency department, presents with chronic abdominal epigastric pain associated with some loose stools, nausea and vomiting. She says she cannot keep down her home medicines including Reglan.  Patient's pain is a 10 out of 10, and it radiates outwards from the epigastrium side to side, she says this feels like her chronic pain, is been no fevers, no chills, shortness of breath, chest pain, no headache, recent illness, no dysuria, frequency, and no other pain.     Past Medical History  Diagnosis Date  . Pancreatitis   . S/P laparoscopic cholecystectomy   . Fibroids   . Ovarian cyst   . Chronic abdominal pain   . Anemia    Past Surgical History  Procedure Laterality Date  . Cholecystectomy    . Dilation and curettage of uterus    . Ercp w/ metal stent placement     Family History  Problem Relation Age of Onset  . Diabetes Mother   . Hypertension Mother   . Hypertension Father   . Diabetes Father   . Asthma Daughter    History  Substance Use Topics  . Smoking status: Former Smoker -- 0.25 packs/day for 10 years    Types: Cigarettes    Quit date: 09/01/2012  . Smokeless tobacco: Never Used  . Alcohol Use: No   OB History   Grav Para Term Preterm Abortions TAB SAB Ect Mult Living   4 2 1 1 2  2         Review of Systems At least 10pt or greater review of systems completed and are negative except where specified in the HPI.  Allergies  Celecoxib; Fentanyl; Ibuprofen; Ketorolac tromethamine; Meperidine hcl; Morphine; Ondansetron; Other; Shellfish allergy; Tramadol; Vicodin; Coconut flavor; Propoxyphene-acetaminophen; and Tylenol  Home  Medications   Current Outpatient Rx  Name  Route  Sig  Dispense  Refill  . estradiol (ESTRACE) 2 MG tablet   Oral   Take 2 mg by mouth at bedtime.          BP 133/80  Pulse 88  Temp(Src) 97.6 F (36.4 C) (Oral)  Resp 18  SpO2 100%  LMP 07/05/2004 Physical Exam  Nursing notes reviewed.  Electronic medical record reviewed. VITAL SIGNS:   Filed Vitals:   01/27/13 0600 01/27/13 0615 01/27/13 0645 01/27/13 0700  BP: 135/88 130/71 130/75 133/80  Pulse: 100 87 83 88  Temp:      TempSrc:      Resp:      SpO2: 98% 97% 100% 100%   CONSTITUTIONAL: Awake, oriented, appears non-toxic HENT: Atraumatic, normocephalic, oral mucosa pink and moist, airway patent. Nares patent without drainage. External ears normal. EYES: Conjunctiva clear, EOMI, PERRLA NECK: Trachea midline, non-tender, supple CARDIOVASCULAR: Normal heart rate, Normal rhythm, No murmurs, rubs, gallops PULMONARY/CHEST: Clear to auscultation, no rhonchi, wheezes, or rales. Symmetrical breath sounds. Non-tender. ABDOMINAL: Non-distended, soft, tenderness to palpation in the epigastrium without rebound or guarding NEUROLOGIC: Non-focal, moving all four extremities, no gross sensory or motor deficits. EXTREMITIES: No clubbing, cyanosis, or edema SKIN: Warm, Dry, No erythema, No rash  ED Course  Procedures (including critical care time)  Labs Reviewed  CBC WITH DIFFERENTIAL - Abnormal; Notable for the following:    Eosinophils Relative 7 (*)    All other components within normal limits  COMPREHENSIVE METABOLIC PANEL - Abnormal; Notable for the following:    Glucose, Bld 106 (*)    ALT 43 (*)    Total Bilirubin 0.1 (*)    All other components within normal limits  LIPASE, BLOOD   No results found. 1. Chronic abdominal pain   2. Nausea   3. Frequent loose stools     MDM  Presents with chronic pain. A few weeks ago, patient was seen in the ER and did have elevated liver function tests - patient was admitted at  that time, she says that her pain was worse at that time. Based on her recent abnormal labs, recheck labs today, treat pain in the emergency department.  Labs are unremarkable, repeat exam is unremarkable.  No narcotic prescription.  Return to the emergency department for worsening conditions, see your primary care physician Dr. Brendia Sacks for an appropriate pain management followup referral  Jones Skene, MD 01/27/13 [phone]

## 2013-01-28 ENCOUNTER — Emergency Department (HOSPITAL_COMMUNITY)
Admission: EM | Admit: 2013-01-28 | Discharge: 2013-01-28 | Discharge status: Against Medical Advice | Payer: Medicaid Other | Attending: Emergency Medicine | Admitting: Emergency Medicine

## 2013-01-28 ENCOUNTER — Encounter (HOSPITAL_COMMUNITY): Payer: Self-pay | Admitting: Emergency Medicine

## 2013-01-28 DIAGNOSIS — Z79899 Other long term (current) drug therapy: Secondary | ICD-10-CM | POA: Insufficient documentation

## 2013-01-28 DIAGNOSIS — K859 Acute pancreatitis without necrosis or infection, unspecified: Secondary | ICD-10-CM | POA: Insufficient documentation

## 2013-01-28 DIAGNOSIS — Z87891 Personal history of nicotine dependence: Secondary | ICD-10-CM | POA: Insufficient documentation

## 2013-01-28 DIAGNOSIS — R112 Nausea with vomiting, unspecified: Secondary | ICD-10-CM | POA: Insufficient documentation

## 2013-01-28 DIAGNOSIS — Z9089 Acquired absence of other organs: Secondary | ICD-10-CM | POA: Insufficient documentation

## 2013-01-28 DIAGNOSIS — G8929 Other chronic pain: Secondary | ICD-10-CM | POA: Insufficient documentation

## 2013-01-28 DIAGNOSIS — Z8742 Personal history of other diseases of the female genital tract: Secondary | ICD-10-CM | POA: Insufficient documentation

## 2013-01-28 DIAGNOSIS — R197 Diarrhea, unspecified: Secondary | ICD-10-CM | POA: Insufficient documentation

## 2013-01-28 DIAGNOSIS — Z862 Personal history of diseases of the blood and blood-forming organs and certain disorders involving the immune mechanism: Secondary | ICD-10-CM | POA: Insufficient documentation

## 2013-01-28 DIAGNOSIS — R1013 Epigastric pain: Secondary | ICD-10-CM | POA: Insufficient documentation

## 2013-01-28 NOTE — ED Notes (Addendum)
Pt choosing to leave, not wanting to wait any longer, ambulatory out of ED with fiends, steady gait, alert, NAD, calm.  EDPA notified.

## 2013-01-28 NOTE — ED Provider Notes (Signed)
CSN: [id number]     Arrival date & time 01/28/13  2126 History     First MD Initiated Contact with Patient 01/28/13 2148     Chief Complaint  Patient presents with  . Abdominal Pain   (Consider location/radiation/quality/duration/timing/severity/associated sxs/prior Treatment) HPI Comments: Patient presents emergency department with chief complaint of abdominal pain. Have seen this patient before for the same complaint, she has been seen 46 times in the past 6 months. She has a chronic pain patient. She states that she has been doing okay on her pain, but recently started bothering her again. She reports nausea, vomiting, diarrhea. She was seen recently, and discharged with antiemetics and instructions to drink clear liquids.  The history is provided by the patient. No language interpreter was used.    Past Medical History  Diagnosis Date  . Pancreatitis   . S/P laparoscopic cholecystectomy   . Fibroids   . Ovarian cyst   . Chronic abdominal pain   . Anemia    Past Surgical History  Procedure Laterality Date  . Cholecystectomy    . Dilation and curettage of uterus    . Ercp w/ metal stent placement     Family History  Problem Relation Age of Onset  . Diabetes Mother   . Hypertension Mother   . Hypertension Father   . Diabetes Father   . Asthma Daughter    History  Substance Use Topics  . Smoking status: Former Smoker -- 0.25 packs/day for 10 years    Types: Cigarettes    Quit date: 09/01/2012  . Smokeless tobacco: Never Used  . Alcohol Use: No   OB History   Grav Para Term Preterm Abortions TAB SAB Ect Mult Living   4 2 1 1 2  2         Review of Systems  All other systems reviewed and are negative.    Allergies  Celecoxib; Coconut flavor; Fentanyl; Ibuprofen; Ketorolac tromethamine; Meperidine hcl; Morphine; Ondansetron; Other; Shellfish allergy; Tramadol; Ketamine; Propoxyphene-acetaminophen; and Tylenol  Home Medications   Current Outpatient Rx  Name   Route  Sig  Dispense  Refill  . estradiol (ESTRACE) 2 MG tablet   Oral   Take 2 mg by mouth at bedtime.         . lipase/protease/amylase (CREON-12/PANCREASE) 12000 UNITS CPEP   Oral   Take 2 capsules by mouth 3 (three) times daily before meals.          BP 137/80  Pulse 91  Temp(Src) 98.7 F (37.1 C) (Oral)  Resp 17  SpO2 99%  LMP 07/05/2004 Physical Exam  Nursing note and vitals reviewed. Constitutional: She is oriented to person, place, and time. She appears well-developed and well-nourished.  HENT:  Head: Normocephalic and atraumatic.  Eyes: Conjunctivae and EOM are normal. Pupils are equal, round, and reactive to light.  Neck: Normal range of motion. Neck supple.  Cardiovascular: Normal rate and regular rhythm.  Exam reveals no gallop and no friction rub.   No murmur heard. Pulmonary/Chest: Effort normal and breath sounds normal. No respiratory distress. She has no wheezes. She has no rales. She exhibits no tenderness.  Abdominal: Soft. Bowel sounds are normal. She exhibits no distension and no mass. There is tenderness. There is no rebound and no guarding.  Epigastric tenderness  Musculoskeletal: Normal range of motion. She exhibits no edema and no tenderness.  Neurological: She is alert and oriented to person, place, and time.  Skin: Skin is warm and dry.  Psychiatric:  She has a normal mood and affect. Her behavior is normal. Judgment and thought content normal.    ED Course   Procedures (including critical care time)  Labs Reviewed  CBC WITH DIFFERENTIAL  COMPREHENSIVE METABOLIC PANEL  LIPASE, BLOOD   No results found. 1. Chronic pain     MDM  Patient with chronic abdominal pain. I have reviewed previous notes and workups. Will check basic labs, and will reevaluate.   I was informed that the patient did not want to wait for her blood to be drawn, so she left AMA.  She was not in any apparent distress when I examined her, but I did stress that I wanted to  get blood work as she had some recent changes.  Discharge is AMA.  Roxy Horseman, PA-C 01/28/13 2358

## 2013-01-28 NOTE — ED Notes (Signed)
C/o chronic abd pain, nausea constant, also diarrhea. Last emesis and diarrhea PTA, attempted to eat soup at ~ 1800, "wasn't able to keep it down", pt alert, NAD, calm, restful, speaking in clear complete sentences, skin W&D. Denies urinary sx, fever or other sx.

## 2013-01-28 NOTE — ED Notes (Signed)
PT. REPORTS CHRONIC GENERALIZED ABDOMINAL PAIN FOR SEVERAL YEARS WITH NAUSEA , VOMITTING AND DIARRHEA , SEEN AND TREATED HERE YESTERDAY FOR THE SAME COMPLAINTS .

## 2013-01-29 NOTE — ED Provider Notes (Signed)
Medical screening examination/treatment/procedure(s) were performed by non-physician practitioner and as supervising physician I was immediately available for consultation/collaboration.  Mariely Mahr, MD 01/29/13 1715 

## 2013-01-31 ENCOUNTER — Emergency Department (HOSPITAL_COMMUNITY): Payer: Medicaid Other

## 2013-01-31 ENCOUNTER — Encounter (HOSPITAL_COMMUNITY): Payer: Self-pay | Admitting: Emergency Medicine

## 2013-01-31 ENCOUNTER — Emergency Department (HOSPITAL_COMMUNITY)
Admission: EM | Admit: 2013-01-31 | Discharge: 2013-01-31 | Disposition: A | Discharge status: Home / Self Care | Payer: Medicaid Other | Attending: Emergency Medicine | Admitting: Emergency Medicine

## 2013-01-31 DIAGNOSIS — M542 Cervicalgia: Secondary | ICD-10-CM

## 2013-01-31 DIAGNOSIS — IMO0002 Reserved for concepts with insufficient information to code with codable children: Secondary | ICD-10-CM | POA: Insufficient documentation

## 2013-01-31 DIAGNOSIS — Z87891 Personal history of nicotine dependence: Secondary | ICD-10-CM | POA: Insufficient documentation

## 2013-01-31 DIAGNOSIS — S199XXA Unspecified injury of neck, initial encounter: Secondary | ICD-10-CM | POA: Insufficient documentation

## 2013-01-31 DIAGNOSIS — M549 Dorsalgia, unspecified: Secondary | ICD-10-CM

## 2013-01-31 DIAGNOSIS — Y9389 Activity, other specified: Secondary | ICD-10-CM | POA: Insufficient documentation

## 2013-01-31 DIAGNOSIS — S0993XA Unspecified injury of face, initial encounter: Secondary | ICD-10-CM | POA: Insufficient documentation

## 2013-01-31 DIAGNOSIS — Z8719 Personal history of other diseases of the digestive system: Secondary | ICD-10-CM | POA: Insufficient documentation

## 2013-01-31 DIAGNOSIS — Z79899 Other long term (current) drug therapy: Secondary | ICD-10-CM | POA: Insufficient documentation

## 2013-01-31 DIAGNOSIS — Z8742 Personal history of other diseases of the female genital tract: Secondary | ICD-10-CM | POA: Insufficient documentation

## 2013-01-31 DIAGNOSIS — Z862 Personal history of diseases of the blood and blood-forming organs and certain disorders involving the immune mechanism: Secondary | ICD-10-CM | POA: Insufficient documentation

## 2013-01-31 MED ORDER — CYCLOBENZAPRINE HCL 10 MG PO TABS: 10.0000 mg | ORAL_TABLET | Freq: Two times a day (BID) | ORAL | Status: DC | PRN

## 2013-01-31 MED ORDER — CYCLOBENZAPRINE HCL 10 MG PO TABS
10.0000 mg | ORAL_TABLET | Freq: Once | ORAL | Status: AC
  Administered 2013-01-31: 10 mg via ORAL
  Filled 2013-01-31: qty 1

## 2013-01-31 NOTE — ED Notes (Signed)
PT returned from xray; no signs of distress.
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
## 2013-01-31 NOTE — ED Notes (Signed)
Patient transported to X-ray 
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
## 2013-01-31 NOTE — ED Notes (Signed)
PT ambulated with baseline gait; VSS; A&Ox3; no signs of distress; respirations even and unlabored; skin warm and dry; no questions upon discharge.  

## 2013-01-31 NOTE — ED Notes (Signed)
Pt removed from backboard. No signs of distress.

## 2013-02-01 NOTE — ED Provider Notes (Signed)
Medical screening examination/treatment/procedure(s) were performed by non-physician practitioner and as supervising physician I was immediately available for consultation/collaboration.   Trueman Worlds E Kealohilani Maiorino, MD 02/01/13 0704 

## 2013-02-03 ENCOUNTER — Emergency Department (HOSPITAL_COMMUNITY)
Admission: EM | Admit: 2013-02-03 | Discharge: 2013-02-04 | Disposition: A | Discharge status: Home / Self Care | Payer: Medicaid Other | Attending: Emergency Medicine | Admitting: Emergency Medicine

## 2013-02-03 ENCOUNTER — Encounter (HOSPITAL_COMMUNITY): Payer: Self-pay | Admitting: Family Medicine

## 2013-02-03 DIAGNOSIS — R112 Nausea with vomiting, unspecified: Secondary | ICD-10-CM | POA: Insufficient documentation

## 2013-02-03 DIAGNOSIS — Z87891 Personal history of nicotine dependence: Secondary | ICD-10-CM | POA: Insufficient documentation

## 2013-02-03 DIAGNOSIS — Z8742 Personal history of other diseases of the female genital tract: Secondary | ICD-10-CM | POA: Insufficient documentation

## 2013-02-03 DIAGNOSIS — Z87448 Personal history of other diseases of urinary system: Secondary | ICD-10-CM | POA: Insufficient documentation

## 2013-02-03 DIAGNOSIS — Z9089 Acquired absence of other organs: Secondary | ICD-10-CM | POA: Insufficient documentation

## 2013-02-03 DIAGNOSIS — Z9889 Other specified postprocedural states: Secondary | ICD-10-CM | POA: Insufficient documentation

## 2013-02-03 DIAGNOSIS — Z79899 Other long term (current) drug therapy: Secondary | ICD-10-CM | POA: Insufficient documentation

## 2013-02-03 DIAGNOSIS — R1013 Epigastric pain: Secondary | ICD-10-CM | POA: Insufficient documentation

## 2013-02-03 DIAGNOSIS — Z862 Personal history of diseases of the blood and blood-forming organs and certain disorders involving the immune mechanism: Secondary | ICD-10-CM | POA: Insufficient documentation

## 2013-02-03 DIAGNOSIS — G8929 Other chronic pain: Secondary | ICD-10-CM | POA: Insufficient documentation

## 2013-02-03 DIAGNOSIS — Z8719 Personal history of other diseases of the digestive system: Secondary | ICD-10-CM | POA: Insufficient documentation

## 2013-02-03 NOTE — ED Notes (Addendum)
Patient states " I am having a pancreatitis flare." States she has had abdominal for 5 weeks. Unable to keep anything down. Patient requests to have a female provider.

## 2013-02-04 LAB — COMPREHENSIVE METABOLIC PANEL
ALT: 32 U/L (ref 0–35)
Alkaline Phosphatase: 74 U/L (ref 39–117)
CO2: 22 mEq/L (ref 19–32)
Chloride: 100 mEq/L (ref 96–112)
GFR calc Af Amer: >90 mL/min (ref >90)
Glucose, Bld: 93 mg/dL (ref 70–99)
Potassium: 4.8 mEq/L (ref 3.5–5.1)
Sodium: 133 mEq/L — ABNORMAL LOW (ref 135–145)
Total Bilirubin: 0.2 mg/dL — ABNORMAL LOW (ref 0.3–1.2)
Total Protein: 7.7 g/dL (ref 6.0–8.3)

## 2013-02-04 LAB — CBC WITH DIFFERENTIAL/PLATELET
Eosinophils Absolute: 0.4 10*3/uL (ref 0.0–0.7)
Hemoglobin: 12.9 g/dL (ref 12.0–15.0)
Lymphocytes Relative: 46 % (ref 12–46)
Lymphs Abs: 3.3 10*3/uL (ref 0.7–4.0)
Neutro Abs: 2.9 10*3/uL (ref 1.7–7.7)
Neutrophils Relative %: 40 % — ABNORMAL LOW (ref 43–77)
Platelets: 295 10*3/uL (ref 150–400)
RBC: 4.62 MIL/uL (ref 3.87–5.11)
WBC: 7.2 10*3/uL (ref 4.0–10.5)

## 2013-02-04 MED ORDER — DIPHENHYDRAMINE HCL 25 MG PO CAPS
25.0000 mg | ORAL_CAPSULE | Freq: Once | ORAL | Status: AC
  Administered 2013-02-04: 25 mg via ORAL
  Filled 2013-02-04: qty 1

## 2013-02-04 MED ORDER — OXYCODONE-ACETAMINOPHEN 5-325 MG PO TABS
2.0000 | ORAL_TABLET | Freq: Once | ORAL | Status: AC
  Administered 2013-02-04: 2 via ORAL
  Filled 2013-02-04: qty 2

## 2013-02-04 NOTE — ED Notes (Signed)
patiet reports that she is nauseated. The patient given the option to not take the mediations because of the nausea. The patient chose to take the medications

## 2013-02-04 NOTE — ED Provider Notes (Signed)
Medical screening examination/treatment/procedure(s) were performed by non-physician practitioner and as supervising physician I was immediately available for consultation/collaboration.  John-Adam Myrl Bynum, M.D.     John-Adam Tayven Renteria, MD 02/04/13 0149 

## 2013-02-04 NOTE — ED Provider Notes (Signed)
CSN: [id number]     Arrival date & time 02/03/13  2215 History     First MD Initiated Contact with Patient 02/03/13 2308     Chief Complaint  Patient presents with  . Abdominal Pain  . Emesis   (Consider location/radiation/quality/duration/timing/severity/associated sxs/prior Treatment) HPI  [name] is a 32 y.o. female with a pmh of chronic epigastric abdominal pain. She is well known to myself and ED staff. She presents with chronic abdominal epigastric pain with associated some loose stools, nausea and vomiting. She says she cannot keep down her home medicines. Patient's pain is a 10 out of 10, and it radiates outwards from the epigastrium side to side, she says this feels like her chronic pain, Denies fevers, no chills, shortness of breath, chest pain, no headache, recent illness, no dysuria, frequency, and no other pain.    Past Medical History  Diagnosis Date  . Pancreatitis   . S/P laparoscopic cholecystectomy   . Fibroids   . Ovarian cyst   . Chronic abdominal pain   . Anemia    Past Surgical History  Procedure Laterality Date  . Cholecystectomy    . Dilation and curettage of uterus    . Ercp w/ metal stent placement     Family History  Problem Relation Age of Onset  . Diabetes Mother   . Hypertension Mother   . Hypertension Father   . Diabetes Father   . Asthma Daughter    History  Substance Use Topics  . Smoking status: Former Smoker -- 0.25 packs/day for 10 years    Types: Cigarettes    Quit date: 09/01/2012  . Smokeless tobacco: Never Used  . Alcohol Use: No   OB History   Grav Para Term Preterm Abortions TAB SAB Ect Mult Living   4 2 1 1 2  2         Review of Systems Ten systems reviewed and are negative for acute change, except as noted in the HPI.   Allergies  Celecoxib; Coconut flavor; Fentanyl; Ibuprofen; Ketorolac tromethamine; Meperidine hcl; Morphine; Ondansetron; Other; Shellfish allergy; Tramadol; Ketamine;  Propoxyphene-acetaminophen; and Tylenol  Home Medications   Current Outpatient Rx  Name  Route  Sig  Dispense  Refill  . cyclobenzaprine (FLEXERIL) 10 MG tablet   Oral   Take 1 tablet (10 mg total) by mouth 2 (two) times daily as needed for muscle spasms.   10 tablet   0   . estradiol (ESTRACE) 2 MG tablet   Oral   Take 2 mg by mouth at bedtime.         . lipase/protease/amylase (CREON-12/PANCREASE) 12000 UNITS CPEP   Oral   Take 2 capsules by mouth 3 (three) times daily before meals.          BP 123/76  Pulse 85  Temp(Src) 99 F (37.2 C) (Oral)  Resp 18  SpO2 100%  LMP 01/19/2013 Physical Exam Physical Exam  Nursing note and vitals reviewed.  Constitutional: She is oriented to person, place, and time. She appears well-developed and well-nourished.  HENT:  Head: Normocephalic and atraumatic.  Eyes: Conjunctivae and EOM are normal. Pupils are equal, round, and reactive to light.  Neck: Normal range of motion. Neck supple.  Cardiovascular: Normal rate and regular rhythm. Exam reveals no gallop and no friction rub.  No murmur heard.  Pulmonary/Chest: Effort normal and breath sounds normal. No respiratory distress. She has no wheezes. She has no rales. She exhibits no tenderness.  Abdominal: Soft.  Bowel sounds are normal. She exhibits no distension and no mass. There is tenderness. There is no rebound and no guarding.  Epigastric tenderness  Musculoskeletal: Normal range of motion. She exhibits no edema and no tenderness.  Neurological: She is alert and oriented to person, place, and time.  Skin: Skin is warm and dry.  Psychiatric: She has a normal mood and affect. Her behavior is normal. Judgment and thought content normal.   ED Course   Procedures (including critical care time)  Labs Reviewed  CBC WITH DIFFERENTIAL - Abnormal; Notable for the following:    Neutrophils Relative % 40 (*)    Eosinophils Relative 6 (*)    All other components within normal limits   COMPREHENSIVE METABOLIC PANEL - Abnormal; Notable for the following:    Sodium 133 (*)    Total Bilirubin 0.2 (*)    All other components within normal limits  LIPASE, BLOOD  URINALYSIS, ROUTINE W REFLEX MICROSCOPIC   No results found. 1. Chronic abdominal pain     MDM  1:03 AM BP 123/76  Pulse 85  Temp(Src) 99 F (37.2 C) (Oral)  Resp 18  SpO2 100%  LMP 01/19/2013 Patient here for chronic pain. Seen  1 week ago with normal labs. Labs unrmarkable today.  She c/o of itching. Patient given PO benadryl and percocet, which unlike her home meds, she was able to tolerate.  The patient appears reasonably screened and/or stabilized for discharge and I doubt any other medical condition or other [facility] requiring further screening, evaluation, or treatment in the ED at this time prior to discharge.   Arthor Captain, PA-C 02/04/13 0106

## 2013-02-07 ENCOUNTER — Encounter (HOSPITAL_COMMUNITY): Payer: Self-pay | Admitting: Emergency Medicine

## 2013-02-07 DIAGNOSIS — Z862 Personal history of diseases of the blood and blood-forming organs and certain disorders involving the immune mechanism: Secondary | ICD-10-CM | POA: Insufficient documentation

## 2013-02-07 DIAGNOSIS — R109 Unspecified abdominal pain: Secondary | ICD-10-CM | POA: Insufficient documentation

## 2013-02-07 DIAGNOSIS — Z9049 Acquired absence of other specified parts of digestive tract: Secondary | ICD-10-CM | POA: Insufficient documentation

## 2013-02-07 DIAGNOSIS — Z8739 Personal history of other diseases of the musculoskeletal system and connective tissue: Secondary | ICD-10-CM | POA: Insufficient documentation

## 2013-02-07 DIAGNOSIS — E669 Obesity, unspecified: Secondary | ICD-10-CM | POA: Insufficient documentation

## 2013-02-07 DIAGNOSIS — Z79899 Other long term (current) drug therapy: Secondary | ICD-10-CM | POA: Insufficient documentation

## 2013-02-07 DIAGNOSIS — Z8719 Personal history of other diseases of the digestive system: Secondary | ICD-10-CM | POA: Insufficient documentation

## 2013-02-07 DIAGNOSIS — Z87891 Personal history of nicotine dependence: Secondary | ICD-10-CM | POA: Insufficient documentation

## 2013-02-07 DIAGNOSIS — R1110 Vomiting, unspecified: Secondary | ICD-10-CM | POA: Insufficient documentation

## 2013-02-07 DIAGNOSIS — R197 Diarrhea, unspecified: Secondary | ICD-10-CM | POA: Insufficient documentation

## 2013-02-07 NOTE — ED Notes (Signed)
PT. REPORTS CHRONIC GENERALIZED ABDOMINAL PAIN WITH EMESIS AND DIARRHEA , SEEN HERE 02/03/2013 WITH THE SAME COMPLAINTS .

## 2013-02-08 ENCOUNTER — Emergency Department (HOSPITAL_COMMUNITY)
Admission: EM | Admit: 2013-02-08 | Discharge: 2013-02-08 | Disposition: A | Discharge status: Home / Self Care | Payer: Medicaid Other | Attending: Emergency Medicine | Admitting: Emergency Medicine

## 2013-02-08 DIAGNOSIS — G8929 Other chronic pain: Secondary | ICD-10-CM

## 2013-02-08 LAB — CBC WITH DIFFERENTIAL/PLATELET
Eosinophils Relative: 6 % — ABNORMAL HIGH (ref 0–5)
HCT: 41.9 % (ref 36.0–46.0)
Hemoglobin: 14.4 g/dL (ref 12.0–15.0)
Lymphocytes Relative: 42 % (ref 12–46)
Lymphs Abs: 3.2 10*3/uL (ref 0.7–4.0)
MCV: 81.7 fL (ref 78.0–100.0)
Platelets: 223 10*3/uL (ref 150–400)
RBC: 5.13 MIL/uL — ABNORMAL HIGH (ref 3.87–5.11)
WBC: 7.5 10*3/uL (ref 4.0–10.5)

## 2013-02-08 LAB — COMPREHENSIVE METABOLIC PANEL
AST: 76 U/L — ABNORMAL HIGH (ref 0–37)
Albumin: 4 g/dL (ref 3.5–5.2)
CO2: 22 mEq/L (ref 19–32)
Calcium: 9.3 mg/dL (ref 8.4–10.5)
Glucose, Bld: 96 mg/dL (ref 70–99)
Sodium: 138 mEq/L (ref 135–145)
Total Bilirubin: 0.2 mg/dL — ABNORMAL LOW (ref 0.3–1.2)
Total Protein: 7.8 g/dL (ref 6.0–8.3)

## 2013-02-08 LAB — LIPASE, BLOOD: Lipase: 33 U/L (ref 11–59)

## 2013-02-08 MED ORDER — HYDROMORPHONE HCL PF 1 MG/ML IJ SOLN
0.5000 mg | Freq: Once | INTRAMUSCULAR | Status: AC
  Administered 2013-02-08: 0.5 mg via INTRAMUSCULAR
  Filled 2013-02-08: qty 1

## 2013-02-08 MED ORDER — METOCLOPRAMIDE HCL 5 MG/ML IJ SOLN
10.0000 mg | Freq: Once | INTRAMUSCULAR | Status: AC
  Administered 2013-02-08: 10 mg via INTRAMUSCULAR
  Filled 2013-02-08: qty 2

## 2013-02-08 MED ORDER — DIPHENHYDRAMINE HCL 50 MG/ML IJ SOLN
25.0000 mg | Freq: Once | INTRAMUSCULAR | Status: AC
  Administered 2013-02-08: 25 mg via INTRAMUSCULAR
  Filled 2013-02-08: qty 1

## 2013-02-08 MED ORDER — DIPHENHYDRAMINE HCL 50 MG/ML IJ SOLN: 25.0000 mg | Freq: Once | INTRAMUSCULAR | Status: DC

## 2013-02-08 NOTE — ED Provider Notes (Signed)
CSN: [id number]     Arrival date & time 02/07/13  2212 History     First MD Initiated Contact with Patient 02/08/13 [phone]     Chief Complaint  Patient presents with  . Abdominal Pain  . Emesis  . Diarrhea   (Consider location/radiation/quality/duration/timing/severity/associated sxs/prior Treatment) HPI History provided by pt and prior chart.  Pt presents w/ severe epigastric and LUQ pain x 5 weeks.  Pain constant, non-radiating and associated w/ N/V/D.  Unable to tolerate pos, including her medications.  Anti-emetic suppositories don't work d/t the diarrhea.  Has not had fever, urinary or vaginal sx.  Sx typical of her pancreatitis exacerbations.  Her "pancreas specialist" in [place] has discharged her because there is nothing more he can do.  Does not currently see a gastroenterologist.  Past abd surgeries include pancreatic stenting and cholecystectomy.  Per prior chart, this is patient's 46th visit in 6 months.  She is seen for the same complaint frequently.   Past Medical History  Diagnosis Date  . Pancreatitis   . S/P laparoscopic cholecystectomy   . Fibroids   . Ovarian cyst   . Chronic abdominal pain   . Anemia    Past Surgical History  Procedure Laterality Date  . Cholecystectomy    . Dilation and curettage of uterus    . Ercp w/ metal stent placement     Family History  Problem Relation Age of Onset  . Diabetes Mother   . Hypertension Mother   . Hypertension Father   . Diabetes Father   . Asthma Daughter    History  Substance Use Topics  . Smoking status: Former Smoker -- 0.25 packs/day for 10 years    Types: Cigarettes    Quit date: 09/01/2012  . Smokeless tobacco: Never Used  . Alcohol Use: No   OB History   Grav Para Term Preterm Abortions TAB SAB Ect Mult Living   4 2 1 1 2  2         Review of Systems  All other systems reviewed and are negative.    Allergies  Celecoxib; Coconut flavor; Fentanyl; Ibuprofen; Ketorolac tromethamine; Meperidine hcl;  Morphine; Ondansetron; Other; Shellfish allergy; Tramadol; Ketamine; Propoxyphene-acetaminophen; and Tylenol  Home Medications   Current Outpatient Rx  Name  Route  Sig  Dispense  Refill  . cyclobenzaprine (FLEXERIL) 10 MG tablet   Oral   Take 10 mg by mouth 2 (two) times daily as needed for muscle spasms.         Marland Kitchen estradiol (ESTRACE) 2 MG tablet   Oral   Take 2 mg by mouth at bedtime.         . lipase/protease/amylase (CREON-12/PANCREASE) 12000 UNITS CPEP   Oral   Take 2 capsules by mouth 3 (three) times daily before meals.          BP 119/81  Pulse 72  Temp(Src) 98.2 F (36.8 C) (Oral)  Resp 18  SpO2 99%  LMP 01/19/2013 Physical Exam  Nursing note and vitals reviewed. Constitutional: She is oriented to person, place, and time. She appears well-developed and well-nourished. No distress.  HENT:  Head: Normocephalic and atraumatic.  Mouth/Throat: Oropharynx is clear and moist.  Eyes:  Normal appearance  Neck: Normal range of motion.  Cardiovascular: Normal rate and regular rhythm.   Pulmonary/Chest: Effort normal and breath sounds normal. No respiratory distress.  Abdominal: Soft. Bowel sounds are normal. She exhibits no distension and no mass. There is no rebound and no guarding.  Obese.  Diffuse ttp, worst in epigastrium and LUQ.  Genitourinary:  No CVA tenderness  Musculoskeletal: Normal range of motion.  Neurological: She is alert and oriented to person, place, and time.  Skin: Skin is warm and dry. No rash noted.  Psychiatric: She has a normal mood and affect. Her behavior is normal.    ED Course   Procedures (including critical care time)  Labs Reviewed  CBC WITH DIFFERENTIAL - Abnormal; Notable for the following:    RBC 5.13 (*)    Eosinophils Relative 6 (*)    All other components within normal limits  COMPREHENSIVE METABOLIC PANEL - Abnormal; Notable for the following:    AST 76 (*)    ALT 138 (*)    Total Bilirubin 0.2 (*)    All other  components within normal limits  LIPASE, BLOOD   No results found. 1. Chronic abdominal pain     MDM  32yo F w/ chronic abdominal pain and pancreatitis with frequent ED visits for same, and h/o  Cholecystectomy and pancreatic stenting, presents w/ epigastric and LUQ pain + N/V/D w/ inability to tolerate pos.  Afebrile, non-toxic appearing, well-hydrated, abd soft/non-distended, epigastric/LUQ ttp on exam.  Labs sig for mild elevation in transaminases.  Results discussed w/ patient.  She had improvement in sx with 0.5mg  IM dilaudid and IM reglan.  Advised to f/u with GI, Dr. Dulce Sellar, who consulted during her most recent admission for abd pain.  Return precautions discussed.   Otilio Miu, PA-C 02/08/13 [phone]

## 2013-02-08 NOTE — ED Provider Notes (Signed)
Medical screening examination/treatment/procedure(s) were performed by non-physician practitioner and as supervising physician I was immediately available for consultation/collaboration.  Hurman Horn, MD 02/08/13 [phone]

## 2013-02-19 ENCOUNTER — Encounter (HOSPITAL_COMMUNITY): Payer: Self-pay | Admitting: Emergency Medicine

## 2013-02-19 ENCOUNTER — Emergency Department (HOSPITAL_COMMUNITY): Payer: Medicaid Other

## 2013-02-19 ENCOUNTER — Emergency Department (HOSPITAL_COMMUNITY)
Admission: EM | Admit: 2013-02-19 | Discharge: 2013-02-20 | Disposition: A | Discharge status: Home / Self Care | Payer: Medicaid Other | Attending: Emergency Medicine | Admitting: Emergency Medicine

## 2013-02-19 DIAGNOSIS — Z87891 Personal history of nicotine dependence: Secondary | ICD-10-CM | POA: Insufficient documentation

## 2013-02-19 DIAGNOSIS — G8929 Other chronic pain: Secondary | ICD-10-CM | POA: Insufficient documentation

## 2013-02-19 DIAGNOSIS — J069 Acute upper respiratory infection, unspecified: Secondary | ICD-10-CM | POA: Insufficient documentation

## 2013-02-19 DIAGNOSIS — Z862 Personal history of diseases of the blood and blood-forming organs and certain disorders involving the immune mechanism: Secondary | ICD-10-CM | POA: Insufficient documentation

## 2013-02-19 DIAGNOSIS — R059 Cough, unspecified: Secondary | ICD-10-CM | POA: Insufficient documentation

## 2013-02-19 DIAGNOSIS — R748 Abnormal levels of other serum enzymes: Secondary | ICD-10-CM | POA: Insufficient documentation

## 2013-02-19 DIAGNOSIS — J029 Acute pharyngitis, unspecified: Secondary | ICD-10-CM | POA: Insufficient documentation

## 2013-02-19 DIAGNOSIS — R05 Cough: Secondary | ICD-10-CM | POA: Insufficient documentation

## 2013-02-19 DIAGNOSIS — Z8742 Personal history of other diseases of the female genital tract: Secondary | ICD-10-CM | POA: Insufficient documentation

## 2013-02-19 DIAGNOSIS — R0982 Postnasal drip: Secondary | ICD-10-CM | POA: Insufficient documentation

## 2013-02-19 DIAGNOSIS — Z3202 Encounter for pregnancy test, result negative: Secondary | ICD-10-CM | POA: Insufficient documentation

## 2013-02-19 DIAGNOSIS — Z8719 Personal history of other diseases of the digestive system: Secondary | ICD-10-CM | POA: Insufficient documentation

## 2013-02-19 LAB — CBC WITH DIFFERENTIAL/PLATELET
Basophils Relative: 1 % (ref 0–1)
Eosinophils Relative: 6 % — ABNORMAL HIGH (ref 0–5)
HCT: 36.6 % (ref 36.0–46.0)
Hemoglobin: 12.8 g/dL (ref 12.0–15.0)
Lymphocytes Relative: 32 % (ref 12–46)
Monocytes Relative: 10 % (ref 3–12)
Neutro Abs: 3.5 10*3/uL (ref 1.7–7.7)
Neutrophils Relative %: 51 % (ref 43–77)
RBC: 4.51 MIL/uL (ref 3.87–5.11)

## 2013-02-19 LAB — COMPREHENSIVE METABOLIC PANEL
ALT: 330 U/L — ABNORMAL HIGH (ref 0–35)
AST: 269 U/L — ABNORMAL HIGH (ref 0–37)
Alkaline Phosphatase: 127 U/L — ABNORMAL HIGH (ref 39–117)
CO2: 26 mEq/L (ref 19–32)
Calcium: 8.9 mg/dL (ref 8.4–10.5)
Chloride: 104 mEq/L (ref 96–112)
GFR calc non Af Amer: >90 mL/min (ref >90)
Glucose, Bld: 90 mg/dL (ref 70–99)
Potassium: 3.5 mEq/L (ref 3.5–5.1)
Sodium: 141 mEq/L (ref 135–145)

## 2013-02-19 LAB — RAPID STREP SCREEN (MED CTR MEBANE ONLY): Streptococcus, Group A Screen (Direct): NEGATIVE

## 2013-02-19 MED ORDER — HYDROCOD POLST-CHLORPHEN POLST 10-8 MG/5ML PO LQCR
5.0000 mL | Freq: Once | ORAL | Status: DC
  Filled 2013-02-19: qty 5

## 2013-02-19 MED ORDER — PSEUDOEPHEDRINE HCL 30 MG PO TABS
30.0000 mg | ORAL_TABLET | Freq: Once | ORAL | Status: AC
  Administered 2013-02-19: 30 mg via ORAL
  Filled 2013-02-19: qty 1

## 2013-02-19 MED ORDER — GUAIFENESIN 100 MG/5ML PO SOLN
5.0000 mL | Freq: Once | ORAL | Status: AC
  Administered 2013-02-19: 100 mg via ORAL
  Filled 2013-02-19: qty 5

## 2013-02-19 MED ORDER — OXYCODONE-ACETAMINOPHEN 5-325 MG PO TABS
1.0000 | ORAL_TABLET | Freq: Once | ORAL | Status: AC
  Administered 2013-02-19: 1 via ORAL
  Filled 2013-02-19: qty 1

## 2013-02-19 NOTE — ED Provider Notes (Signed)
CSN: [id number]     Arrival date & time 02/19/13  1945 History     First MD Initiated Contact with Patient 02/19/13 2025     Chief Complaint  Patient presents with  . Abdominal Pain   (Consider location/radiation/quality/duration/timing/severity/associated sxs/prior Treatment) HPI Comments: Patient reports, that she's had an exacerbation of her chronic abdominal pain in the left upper quadrant.  She also reports that for the past, week.  She's had some myalgias, a nonproductive cough, and a vague, sore throat.  She denies any chills, or fever.  Has not contacted her primary care physician.  She has not taken any medication.  Other than her prescribed medicines for her discomfort  Patient is a 32 y.o. female presenting with abdominal pain. The history is provided by the patient.  Abdominal Pain Pain location:  LUQ Pain quality: aching   Pain radiates to:  Does not radiate Pain severity:  Moderate Onset quality:  Unable to specify Duration:  3 days Timing:  Constant Progression:  Worsening Chronicity:  Chronic Relieved by:  None tried Worsened by:  Coughing Associated symptoms: cough and sore throat   Associated symptoms: no chills, no dysuria, no fever, no nausea, no shortness of breath and no vomiting     Past Medical History  Diagnosis Date  . Pancreatitis   . S/P laparoscopic cholecystectomy   . Fibroids   . Ovarian cyst   . Chronic abdominal pain   . Anemia    Past Surgical History  Procedure Laterality Date  . Cholecystectomy    . Dilation and curettage of uterus    . Ercp w/ metal stent placement     Family History  Problem Relation Age of Onset  . Diabetes Mother   . Hypertension Mother   . Hypertension Father   . Diabetes Father   . Asthma Daughter    History  Substance Use Topics  . Smoking status: Former Smoker -- 0.25 packs/day for 10 years    Types: Cigarettes    Quit date: 09/01/2012  . Smokeless tobacco: Never Used  . Alcohol Use: No   OB  History   Grav Para Term Preterm Abortions TAB SAB Ect Mult Living   4 2 1 1 2  2         Review of Systems  Constitutional: Negative for fever and chills.  HENT: Positive for sore throat and postnasal drip. Negative for trouble swallowing.   Respiratory: Positive for cough. Negative for shortness of breath.   Gastrointestinal: Positive for abdominal pain. Negative for nausea and vomiting.  Genitourinary: Negative for dysuria, frequency and flank pain.  Neurological: Negative for dizziness and headaches.  All other systems reviewed and are negative.    Allergies  Celecoxib; Coconut flavor; Fentanyl; Ibuprofen; Ketorolac tromethamine; Meperidine hcl; Morphine; Ondansetron; Other; Shellfish allergy; Tramadol; Robitussin (alcohol free); Ketamine; Propoxyphene-acetaminophen; and Tylenol  Home Medications   Current Outpatient Rx  Name  Route  Sig  Dispense  Refill  . estradiol (ESTRACE) 2 MG tablet   Oral   Take 2 mg by mouth at bedtime.         Marland Kitchen oxyCODONE-acetaminophen (PERCOCET/ROXICET) 5-325 MG per tablet   Oral   Take 1 tablet by mouth every 6 (six) hours as needed for pain.   8 tablet   0   . pseudoephedrine (SUDAFED) 30 MG tablet   Oral   Take 1 tablet (30 mg total) by mouth every 6 (six) hours as needed for congestion.   30 tablet  0    BP 125/83  Pulse 79  Temp(Src) 98.2 F (36.8 C) (Oral)  Resp 20  Ht 5\' 5"  (1.651 m)  Wt 177 lb 2 oz (80.343 kg)  BMI 29.48 kg/m2  SpO2 100%  LMP 01/19/2013 Physical Exam  Nursing note and vitals reviewed. Constitutional: She is oriented to person, place, and time. She appears well-developed and well-nourished.  HENT:  Head: Normocephalic.  Eyes: Pupils are equal, round, and reactive to light.  Neck: Normal range of motion.  Cardiovascular: Normal rate.   Pulmonary/Chest: Effort normal and breath sounds normal. No respiratory distress. She has no wheezes. She exhibits no tenderness.  Abdominal: Soft. She exhibits no  distension. There is tenderness in the left upper quadrant. There is no rigidity and no guarding.  Musculoskeletal: Normal range of motion.  Neurological: She is alert and oriented to person, place, and time.  Skin: Skin is warm.    ED Course   Procedures (including critical care time)  Labs Reviewed  CBC WITH DIFFERENTIAL - Abnormal; Notable for the following:    Eosinophils Relative 6 (*)    All other components within normal limits  COMPREHENSIVE METABOLIC PANEL - Abnormal; Notable for the following:    AST 269 (*)    ALT 330 (*)    Alkaline Phosphatase 127 (*)    Total Bilirubin 0.2 (*)    All other components within normal limits  RAPID STREP SCREEN  CULTURE, GROUP A STREP  LIPASE, BLOOD  PREGNANCY, URINE   Dg Chest 2 View  02/19/2013   *RADIOLOGY REPORT*  Clinical Data: Coughing flow in the 1-weekh  CHEST - 2 VIEW  Comparison: 10/31/2012  Findings: Normal mediastinum and cardiac silhouette.  Normal pulmonary  vasculature.  No evidence of effusion, infiltrate, or pneumothorax.  No acute bony abnormality.  IMPRESSION: Normal chest radiograph   Original Report Authenticated By: Genevive Bi, M.D.   US Abdomen Complete  02/19/2013   *RADIOLOGY REPORT*  Clinical Data:  Abdominal pain.  COMPLETE ABDOMINAL ULTRASOUND  Comparison:  12/03/2012  Findings:  Gallbladder:  Surgically absent.  Common bile duct:  Normal caliber, measured diameter of 3.2 mm.  Liver:  Normal parenchymal echotexture.  No focal lesions identified.  Limited color flow Doppler imaging shows appropriate flow direction in the main portal vein.  IVC:  Appears normal.  Pancreas:  No focal abnormality seen.  Spleen:  Spleen length measures 5.1 cm.  Normal parenchymal echotexture.  Right Kidney:  In the area of the right kidney measures 12.2 cm length.  No hydronephrosis.  Cystic structure in the mid pole measuring 1.3 x 1.2 cm diameter.  Left Kidney:  Left kidney measures 10 cm length.  No hydronephrosis.  Abdominal  aorta:  Visualized abdominal aorta demonstrates normal caliber.  Distal abdominal aorta is obscured by bowel gas.  IMPRESSION: Surgical absence of the gallbladder.  Small right renal cyst.  The patient otherwise unremarkable.   Original Report Authenticated By: Burman Nieves, M.D.   1. Elevated liver enzymes   2. URI (upper respiratory infection)     MDM   Obtain CBC CMet, lipase, urine, chest x-ray, and rapid strep Labs reviewed, shows an elevated LFTs.  Ultrasound of the abdomen is normal.  She's been given prescription for pain control, as well as Sudafed to control her postnasal drip.  Recommend she followup with her primary care physician to have her LFTs rechecked in one week  Arman Filter, NP 02/20/13 1610

## 2013-02-19 NOTE — ED Notes (Signed)
PT. REPORTS ABDOMINAL PAIN FOR SEVERAL DAYS AND MILD SORE THROAT.

## 2013-02-19 NOTE — ED Notes (Signed)
Pt in US now

## 2013-02-20 MED ORDER — DIPHENHYDRAMINE HCL 25 MG PO CAPS: 25.0000 mg | ORAL_CAPSULE | Freq: Once | ORAL | Status: DC

## 2013-02-20 MED ORDER — PSEUDOEPHEDRINE HCL 30 MG PO TABS: 30.0000 mg | ORAL_TABLET | Freq: Four times a day (QID) | ORAL | Status: DC | PRN

## 2013-02-20 MED ORDER — OXYCODONE-ACETAMINOPHEN 5-325 MG PO TABS: 1.0000 | ORAL_TABLET | Freq: Four times a day (QID) | ORAL | Status: DC | PRN

## 2013-02-20 NOTE — ED Provider Notes (Signed)
Medical screening examination/treatment/procedure(s) were performed by non-physician practitioner and as supervising physician I was immediately available for consultation/collaboration.   Junius Argyle, MD 02/20/13 1122

## 2013-02-21 LAB — CULTURE, GROUP A STREP

## 2013-02-24 ENCOUNTER — Encounter (HOSPITAL_COMMUNITY): Payer: Self-pay | Admitting: *Deleted

## 2013-02-24 DIAGNOSIS — Z862 Personal history of diseases of the blood and blood-forming organs and certain disorders involving the immune mechanism: Secondary | ICD-10-CM | POA: Insufficient documentation

## 2013-02-24 DIAGNOSIS — R42 Dizziness and giddiness: Secondary | ICD-10-CM | POA: Insufficient documentation

## 2013-02-24 DIAGNOSIS — Z8719 Personal history of other diseases of the digestive system: Secondary | ICD-10-CM | POA: Insufficient documentation

## 2013-02-24 DIAGNOSIS — M549 Dorsalgia, unspecified: Secondary | ICD-10-CM | POA: Insufficient documentation

## 2013-02-24 DIAGNOSIS — R630 Anorexia: Secondary | ICD-10-CM | POA: Insufficient documentation

## 2013-02-24 DIAGNOSIS — R6883 Chills (without fever): Secondary | ICD-10-CM | POA: Insufficient documentation

## 2013-02-24 DIAGNOSIS — R1084 Generalized abdominal pain: Secondary | ICD-10-CM | POA: Insufficient documentation

## 2013-02-24 DIAGNOSIS — R112 Nausea with vomiting, unspecified: Secondary | ICD-10-CM | POA: Insufficient documentation

## 2013-02-24 DIAGNOSIS — R197 Diarrhea, unspecified: Secondary | ICD-10-CM | POA: Insufficient documentation

## 2013-02-24 DIAGNOSIS — Z87891 Personal history of nicotine dependence: Secondary | ICD-10-CM | POA: Insufficient documentation

## 2013-02-24 DIAGNOSIS — Z9889 Other specified postprocedural states: Secondary | ICD-10-CM | POA: Insufficient documentation

## 2013-02-24 DIAGNOSIS — Z8742 Personal history of other diseases of the female genital tract: Secondary | ICD-10-CM | POA: Insufficient documentation

## 2013-02-24 NOTE — ED Notes (Signed)
Pt states she feels like she is having a pancreatitis flare up, with bilater upper abdominal pain.

## 2013-02-25 ENCOUNTER — Emergency Department (HOSPITAL_COMMUNITY)
Admission: EM | Admit: 2013-02-25 | Discharge: 2013-02-25 | Discharge status: Against Medical Advice | Payer: Medicaid Other | Attending: Emergency Medicine | Admitting: Emergency Medicine

## 2013-02-25 DIAGNOSIS — R109 Unspecified abdominal pain: Secondary | ICD-10-CM

## 2013-02-25 LAB — CBC WITH DIFFERENTIAL/PLATELET
Basophils Absolute: 0 10*3/uL (ref 0.0–0.1)
HCT: 39.6 % (ref 36.0–46.0)
Lymphocytes Relative: 42 % (ref 12–46)
Lymphs Abs: 3.5 10*3/uL (ref 0.7–4.0)
Monocytes Absolute: 0.9 10*3/uL (ref 0.1–1.0)
Neutro Abs: 3.6 10*3/uL (ref 1.7–7.7)
Platelets: 266 10*3/uL (ref 150–400)
RBC: 4.85 MIL/uL (ref 3.87–5.11)
RDW: 11.9 % (ref 11.5–15.5)
WBC: 8.3 10*3/uL (ref 4.0–10.5)

## 2013-02-25 LAB — COMPREHENSIVE METABOLIC PANEL
ALT: 120 U/L — ABNORMAL HIGH (ref 0–35)
AST: 51 U/L — ABNORMAL HIGH (ref 0–37)
CO2: 26 mEq/L (ref 19–32)
Chloride: 102 mEq/L (ref 96–112)
GFR calc non Af Amer: >90 mL/min (ref >90)
Glucose, Bld: 85 mg/dL (ref 70–99)
Sodium: 139 mEq/L (ref 135–145)
Total Bilirubin: 0.4 mg/dL (ref 0.3–1.2)

## 2013-02-25 MED ORDER — PROMETHAZINE HCL 25 MG/ML IJ SOLN
25.0000 mg | Freq: Once | INTRAMUSCULAR | Status: DC
  Filled 2013-02-25: qty 1

## 2013-02-25 MED ORDER — DIPHENHYDRAMINE HCL 50 MG/ML IJ SOLN
25.0000 mg | Freq: Once | INTRAMUSCULAR | Status: DC
  Filled 2013-02-25: qty 1

## 2013-02-25 NOTE — ED Notes (Signed)
Attempted to start IV x 1 with Ultrasound and EDP. IV blew. IV team called.

## 2013-02-25 NOTE — ED Provider Notes (Signed)
CSN: [id number]     Arrival date & time 02/24/13  2141 History     First MD Initiated Contact with Patient 02/25/13 0033     Chief Complaint  Patient presents with  . 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  She has had this flare for 4 wks, her pcp is looking into sending her to [facility].  She has had a duct stent in the past, she had her gb removed.  Pt takes narcotics regularly.  Improves with time.  Vomiting intermittently.    Patient is a 32 y.o. female presenting with abdominal pain. The history is provided by the patient.  Abdominal Pain Pain location:  Generalized Associated symptoms: chills, diarrhea, nausea and vomiting   Associated symptoms: no chest pain, no dysuria, no fever and no shortness of breath     Past Medical History  Diagnosis Date  . Pancreatitis   . S/P laparoscopic cholecystectomy   . Fibroids   . Ovarian cyst   . Chronic abdominal pain   . Anemia    Past Surgical History  Procedure Laterality Date  . Cholecystectomy    . Dilation and curettage of uterus    . Ercp w/ metal stent placement     Family History  Problem Relation Age of Onset  . Diabetes Mother   . Hypertension Mother   . Hypertension Father   . Diabetes Father   . Asthma Daughter    History  Substance Use Topics  . Smoking status: Former Smoker -- 0.25 packs/day for 10 years    Types: Cigarettes    Quit date: 09/01/2012  . Smokeless tobacco: Never Used  . Alcohol Use: No   OB History   Grav Para Term Preterm Abortions TAB SAB Ect Mult Living   4 2 1 1 2  2         Review of Systems  Constitutional: Positive for chills and appetite change. Negative for fever.  HENT: Negative for neck pain and neck stiffness.   Eyes: Negative for visual  disturbance.  Respiratory: Negative for shortness of breath.   Cardiovascular: Negative for chest pain.  Gastrointestinal: Positive for nausea, vomiting, abdominal pain and diarrhea.  Genitourinary: Negative for dysuria and flank pain.  Musculoskeletal: Positive for back pain.  Skin: Negative for rash.  Neurological: Positive for light-headedness. Negative for headaches.    Allergies  Celecoxib; Coconut flavor; Fentanyl; Ibuprofen; Ketorolac tromethamine; Meperidine hcl; Morphine; Ondansetron; Other; Shellfish allergy; Tramadol; Robitussin (alcohol free); Ketamine; Propoxyphene-acetaminophen; and Tylenol  Home Medications   Current Outpatient Rx  Name  Route  Sig  Dispense  Refill  . estradiol (ESTRACE) 2 MG tablet   Oral   Take 2 mg by mouth at bedtime.         Marland Kitchen oxyCODONE-acetaminophen (PERCOCET/ROXICET) 5-325 MG per tablet   Oral   Take 1 tablet by mouth every 6 (six) hours as needed for pain.   8 tablet   0   . pseudoephedrine (SUDAFED) 30 MG tablet   Oral   Take 1 tablet (30 mg total) by mouth every 6 (six) hours as needed for congestion.   30 tablet   0    BP 117/65  Pulse 97  Temp(Src) 98.7 F (37.1 C) (Oral)  Resp 18  SpO2 96%  LMP 01/19/2013 Physical Exam  Nursing note and vitals reviewed. Constitutional: She is  oriented to person, place, and time. She appears well-developed and well-nourished.  HENT:  Head: Normocephalic and atraumatic.  Mouth/Throat: Oropharynx is clear and moist.  Eyes: Conjunctivae are normal. Right eye exhibits no discharge. Left eye exhibits no discharge.  Neck: Normal range of motion. Neck supple. No tracheal deviation present.  Cardiovascular: Normal rate and regular rhythm.   Pulmonary/Chest: Effort normal and breath sounds normal.  Abdominal: Soft. She exhibits no distension. There is tenderness (mild central and epig). There is no guarding.  Musculoskeletal: She exhibits no edema and no tenderness.  Neurological: She is alert  and oriented to person, place, and time.  Skin: Skin is warm. No rash noted.  Psychiatric: She has a normal mood and affect.    ED Course   Procedures (including critical care time) Emergency Ultrasound Study:   Angiocath insertion Performed by: Enid Skeens  Consent: Verbal consent obtained. Risks and benefits: risks, benefits and alternatives were discussed Immediately prior to procedure the correct patient, procedure, equipment, support staff and site/side marked as needed.  Indication: difficult IV access Preparation: Patient was prepped and draped in the usual sterile fashion. Vein Location: right AC vein was visualized during assessment for potential access sites and was found to be patent/ easily compressed with linear ultrasound.  The needle was visualized with real-time ultrasound and guided into the vein. Gauge: 20 g  Image saved and stored.  Normal blood return.  Patient tolerance: Patient tolerated the procedure well with no immediate complications.      Labs Reviewed  COMPREHENSIVE METABOLIC PANEL - Abnormal; Notable for the following:    Potassium 3.2 (*)    AST 51 (*)    ALT 120 (*)    All other components within normal limits  LIPASE, BLOOD - Abnormal; Notable for the following:    Lipase 62 (*)    All other components within normal limits  CBC WITH DIFFERENTIAL  AMYLASE  URINALYSIS, ROUTINE W REFLEX MICROSCOPIC   No results found. No diagnosis found.  MDM  Well appearing in ED, very mild pain with distraction. Pt requesting narcotics, discussed her pain has been chronic for months and if lipase was neg She could continue to fup outpt.  Multiple allergies reviewed.  Benadryl for itching and nausea meds.  Difficult IV, US guided by myself.  Pt unhappy as I feel this pain is chronic and I will not give her narcotics at this time. Discussed discharged. Patient left prior to receiving paperwork and final lab results.    Enid Skeens,  MD 02/25/13 [phone]

## 2013-02-25 NOTE — ED Notes (Signed)
IV team attempted to start an IV x2. It was unsuccessful. Notified doctor and he stated pts labs came back  And she no longer needs IV and she can have the medication oral. Pt refused oral medications. Pt stated at that time she was leaving. Recommended and encouraged pt to stay and wait for doctors discharge. Pt refused. Went to notify doctor and patient left. No IVs at the time she left. Pt was fully alert and oriented. No cardiac or respiratory distress at the time of discharge. Doctor was aware of pt leaving AMA.

## 2013-02-25 NOTE — ED Notes (Signed)
Pt came to the ED stating that she has abdominal pain x 4 weeks. She stated that she has chronic pancreatitis. She is also having n/v with pain for 4 weeks. She does not take pain medication at home. PCP is aware of issue. No cardiac or respiratory distress. Will continue to monitor.

## 2013-03-11 ENCOUNTER — Emergency Department (HOSPITAL_COMMUNITY)
Admission: EM | Admit: 2013-03-11 | Discharge: 2013-03-11 | Disposition: A | Discharge status: Home / Self Care | Payer: Medicaid Other | Attending: Emergency Medicine | Admitting: Emergency Medicine

## 2013-03-11 ENCOUNTER — Emergency Department (HOSPITAL_COMMUNITY): Payer: Medicaid Other

## 2013-03-11 ENCOUNTER — Encounter (HOSPITAL_COMMUNITY): Payer: Self-pay | Admitting: *Deleted

## 2013-03-11 DIAGNOSIS — D649 Anemia, unspecified: Secondary | ICD-10-CM | POA: Insufficient documentation

## 2013-03-11 DIAGNOSIS — R1013 Epigastric pain: Secondary | ICD-10-CM | POA: Insufficient documentation

## 2013-03-11 DIAGNOSIS — K859 Acute pancreatitis without necrosis or infection, unspecified: Secondary | ICD-10-CM | POA: Insufficient documentation

## 2013-03-11 DIAGNOSIS — Z8742 Personal history of other diseases of the female genital tract: Secondary | ICD-10-CM | POA: Insufficient documentation

## 2013-03-11 DIAGNOSIS — G8929 Other chronic pain: Secondary | ICD-10-CM

## 2013-03-11 DIAGNOSIS — E049 Nontoxic goiter, unspecified: Secondary | ICD-10-CM | POA: Insufficient documentation

## 2013-03-11 DIAGNOSIS — Z9889 Other specified postprocedural states: Secondary | ICD-10-CM | POA: Insufficient documentation

## 2013-03-11 DIAGNOSIS — R112 Nausea with vomiting, unspecified: Secondary | ICD-10-CM | POA: Insufficient documentation

## 2013-03-11 DIAGNOSIS — Z87891 Personal history of nicotine dependence: Secondary | ICD-10-CM | POA: Insufficient documentation

## 2013-03-11 DIAGNOSIS — R197 Diarrhea, unspecified: Secondary | ICD-10-CM | POA: Insufficient documentation

## 2013-03-11 DIAGNOSIS — Z885 Allergy status to narcotic agent status: Secondary | ICD-10-CM | POA: Insufficient documentation

## 2013-03-11 DIAGNOSIS — Z79899 Other long term (current) drug therapy: Secondary | ICD-10-CM | POA: Insufficient documentation

## 2013-03-11 DIAGNOSIS — Z888 Allergy status to other drugs, medicaments and biological substances status: Secondary | ICD-10-CM | POA: Insufficient documentation

## 2013-03-11 LAB — CBC WITH DIFFERENTIAL/PLATELET
Basophils Relative: 0 % (ref 0–1)
Eosinophils Absolute: 0.3 10*3/uL (ref 0.0–0.7)
Eosinophils Relative: 4 % (ref 0–5)
HCT: 37.7 % (ref 36.0–46.0)
Hemoglobin: 13 g/dL (ref 12.0–15.0)
MCH: 27.8 pg (ref 26.0–34.0)
MCHC: 34.5 g/dL (ref 30.0–36.0)
MCV: 80.7 fL (ref 78.0–100.0)
Monocytes Absolute: 0.8 10*3/uL (ref 0.1–1.0)
Monocytes Relative: 10 % (ref 3–12)

## 2013-03-11 LAB — COMPREHENSIVE METABOLIC PANEL
Albumin: 3.9 g/dL (ref 3.5–5.2)
BUN: 14 mg/dL (ref 6–23)
Calcium: 9.1 mg/dL (ref 8.4–10.5)
Chloride: 104 mEq/L (ref 96–112)
Creatinine, Ser: 0.78 mg/dL (ref 0.50–1.10)
Total Bilirubin: 0.4 mg/dL (ref 0.3–1.2)
Total Protein: 7.5 g/dL (ref 6.0–8.3)

## 2013-03-11 LAB — LIPASE, BLOOD: Lipase: 85 U/L — ABNORMAL HIGH (ref 11–59)

## 2013-03-11 MED ORDER — PROMETHAZINE HCL 25 MG/ML IJ SOLN: 12.5000 mg | Freq: Once | INTRAMUSCULAR | Status: DC

## 2013-03-11 MED ORDER — DIPHENHYDRAMINE HCL 50 MG/ML IJ SOLN
50.0000 mg | Freq: Once | INTRAMUSCULAR | Status: AC
  Administered 2013-03-11: 50 mg via INTRAMUSCULAR
  Filled 2013-03-11: qty 1

## 2013-03-11 MED ORDER — DIPHENHYDRAMINE HCL 25 MG PO CAPS: 50.0000 mg | ORAL_CAPSULE | Freq: Once | ORAL | Status: DC

## 2013-03-11 MED ORDER — PROMETHAZINE HCL 25 MG PO TABS: 25.0000 mg | ORAL_TABLET | Freq: Four times a day (QID) | ORAL | Status: DC | PRN

## 2013-03-11 MED ORDER — PROMETHAZINE HCL 25 MG/ML IJ SOLN
25.0000 mg | Freq: Once | INTRAMUSCULAR | Status: AC
  Administered 2013-03-11: 25 mg via INTRAMUSCULAR
  Filled 2013-03-11: qty 1

## 2013-03-11 MED ORDER — HYDROMORPHONE HCL PF 2 MG/ML IJ SOLN
1.0000 mg | Freq: Once | INTRAMUSCULAR | Status: AC
  Administered 2013-03-11: 1 mg via INTRAMUSCULAR
  Filled 2013-03-11: qty 1

## 2013-03-11 NOTE — ED Notes (Signed)
Pt is here with abdominal pain, nausea, and vomiting

## 2013-03-11 NOTE — ED Provider Notes (Addendum)
CSN: [id number]     Arrival date & time 03/11/13  1621 History   First MD Initiated Contact with Patient 03/11/13 1736     Chief Complaint  Patient presents with  . Abdominal Pain  . Emesis   (Consider location/radiation/quality/duration/timing/severity/associated sxs/prior Treatment) Patient is a 32 y.o. female presenting with abdominal pain and vomiting. The history is provided by the patient.  Abdominal Pain Associated symptoms: diarrhea, nausea and vomiting   Associated symptoms: no chest pain, no dysuria, no fever and no shortness of breath   Emesis Associated symptoms: abdominal pain and diarrhea   Associated symptoms: no headaches    patient with known history of chronic abdominal pain has had acute pancreatitis in the past. Patient with development of epigastric abdominal pain 2 weeks ago that has gotten worse. Associated with nausea and vomiting no vomiting of blood also associated with some diarrhea. Patient is followed by [facility] locally. Patient appears in no acute distress. Patient states that pain is 10 out of 10 sharp in nature epigastric nonradiating made better or worse by anything. The patient has frequent visits to the emergency department at least 4 times a month for similar complaint. Normally labs are without significant abnormalities and is related to her chronic abdominal pain. Past Medical History  Diagnosis Date  . Pancreatitis   . S/P laparoscopic cholecystectomy   . Fibroids   . Ovarian cyst   . Chronic abdominal pain   . Anemia    Past Surgical History  Procedure Laterality Date  . Cholecystectomy    . Dilation and curettage of uterus    . Ercp w/ metal stent placement     Family History  Problem Relation Age of Onset  . Diabetes Mother   . Hypertension Mother   . Hypertension Father   . Diabetes Father   . Asthma Daughter    History  Substance Use Topics  . Smoking status: Former Smoker -- 0.25 packs/day for 10 years    Types: Cigarettes   Quit date: 09/01/2012  . Smokeless tobacco: Never Used  . Alcohol Use: No   OB History   Grav Para Term Preterm Abortions TAB SAB Ect Mult Living   4 2 1 1 2  2         Review of Systems  Constitutional: Negative for fever.  HENT: Negative for neck pain.   Eyes: Negative for redness.  Respiratory: Negative for shortness of breath.   Cardiovascular: Negative for chest pain.  Gastrointestinal: Positive for nausea, vomiting, abdominal pain and diarrhea. Negative for abdominal distention.  Genitourinary: Negative for dysuria.  Musculoskeletal: Negative for back pain.  Skin: Negative for rash.  Neurological: Negative for headaches.  Hematological: Does not bruise/bleed easily.  Psychiatric/Behavioral: Negative for confusion.    Allergies  Celecoxib; Coconut flavor; Fentanyl; Ibuprofen; Ketorolac tromethamine; Meperidine hcl; Morphine; Ondansetron; Other; Shellfish allergy; Tramadol; Robitussin (alcohol free); Ketamine; Propoxyphene-acetaminophen; and Tylenol  Home Medications   Current Outpatient Rx  Name  Route  Sig  Dispense  Refill  . estradiol (ESTRACE) 2 MG tablet   Oral   Take 2 mg by mouth at bedtime.          BP 120/76  Pulse 80  Temp(Src) 98.3 F (36.8 C) (Oral)  Resp 18  SpO2 98%  LMP 03/09/2013 Physical Exam  Nursing note and vitals reviewed. Constitutional: She is oriented to person, place, and time. She appears well-developed and well-nourished. No distress.  HENT:  Head: Normocephalic and atraumatic.  Mouth/Throat: Oropharynx is  clear and moist.  Eyes: Conjunctivae are normal. Pupils are equal, round, and reactive to light.  Neck: Normal range of motion. Thyromegaly present.  Cardiovascular: Normal rate, regular rhythm, normal heart sounds and intact distal pulses.   No murmur heard. Pulmonary/Chest: Effort normal and breath sounds normal. No respiratory distress.  Abdominal: Soft. Bowel sounds are normal. There is no tenderness.  Musculoskeletal:  Normal range of motion.  Neurological: She is alert and oriented to person, place, and time. No cranial nerve deficit. She exhibits normal muscle tone. Coordination normal.  Skin: Skin is warm. No rash noted.    ED Course  Procedures (including critical care time) Labs Review Labs Reviewed  CBC WITH DIFFERENTIAL  COMPREHENSIVE METABOLIC PANEL  LIPASE, BLOOD   Results for orders placed during the hospital encounter of 03/11/13  CBC WITH DIFFERENTIAL      Result Value Range   WBC 8.2  4.0 - 10.5 K/uL   RBC 4.67  3.87 - 5.11 MIL/uL   Hemoglobin 13.0  12.0 - 15.0 g/dL   HCT 78.2  95.6 - 21.3 %   MCV 80.7  78.0 - 100.0 fL   MCH 27.8  26.0 - 34.0 pg   MCHC 34.5  30.0 - 36.0 g/dL   RDW 08.6  57.8 - 46.9 %   Platelets 263  150 - 400 K/uL   Neutrophils Relative % 51  43 - 77 %   Neutro Abs 4.2  1.7 - 7.7 K/uL   Lymphocytes Relative 36  12 - 46 %   Lymphs Abs 2.9  0.7 - 4.0 K/uL   Monocytes Relative 10  3 - 12 %   Monocytes Absolute 0.8  0.1 - 1.0 K/uL   Eosinophils Relative 4  0 - 5 %   Eosinophils Absolute 0.3  0.0 - 0.7 K/uL   Basophils Relative 0  0 - 1 %   Basophils Absolute 0.0  0.0 - 0.1 K/uL  COMPREHENSIVE METABOLIC PANEL      Result Value Range   Sodium 137  135 - 145 mEq/L   Potassium 3.6  3.5 - 5.1 mEq/L   Chloride 104  96 - 112 mEq/L   CO2 22  19 - 32 mEq/L   Glucose, Bld 103 (*) 70 - 99 mg/dL   BUN 14  6 - 23 mg/dL   Creatinine, Ser 6.29  0.50 - 1.10 mg/dL   Calcium 9.1  8.4 - 52.8 mg/dL   Total Protein 7.5  6.0 - 8.3 g/dL   Albumin 3.9  3.5 - 5.2 g/dL   AST 27  0 - 37 U/L   ALT 26  0 - 35 U/L   Alkaline Phosphatase 75  39 - 117 U/L   Total Bilirubin 0.4  0.3 - 1.2 mg/dL   GFR calc non Af Amer >90  >90 mL/min   GFR calc Af Amer >90  >90 mL/min  LIPASE, BLOOD      Result Value Range   Lipase 85 (*) 11 - 59 U/L    and in  Imaging Review Dg Abd Acute W/chest  03/11/2013   *RADIOLOGY REPORT*  Clinical Data:  Abdominal pain and diarrhea.  ACUTE ABDOMEN SERIES  (ABDOMEN 2 VIEW & CHEST 1 VIEW)  Comparison: Chest film 02/19/2013.  Acute abdomen series of 12/03/2012.  Findings: Frontal view of the chest demonstrates midline trachea. Normal heart size and mediastinal contours. No pleural effusion or pneumothorax.  Clear lungs.  Abominal films demonstrate no free intraperitoneal air on upright  positioning.    Cholecystectomy.  Prominent right lobe of the liver, likely a Reidel lobe. No bowel distention and supine imaging.  Distal gas and stool. No abnormal abdominal calcifications.   No appendicolith.  IMPRESSION: No acute findings.   Original Report Authenticated By: Jeronimo Greaves, M.D.    MDM   1. Chronic abdominal pain    Patient with history of chronic abdominal pain frequent visits to the emergency department. Has had pancreatitis acute type in the past. Labs pending to sort that out. Plain films of the abdomen without any significant findings. Patient's nontoxic no acute distress. Patient was treated with IM hydromorphone more phone and IM Phenergan and IM Benadryl. Patient is usually not discharged home with pain medications. Patient's had her stent removed because it was causing a bump in her liver function tests. And was not helping. Patient is followed by GI locally and has been [facility].   Mild elevation in the lipase patient has history of pancreatitis and chronic pancreatitis. Patient does not require admission no leukocytosis no significant liver function test abnormalities. Will treat with liquid diet for the next few days and antinausea medicine. Patient in followup with [facility]   Shelda Jakes, MD 03/11/13 1610  Shelda Jakes, MD 03/11/13 2016

## 2013-03-20 ENCOUNTER — Emergency Department (HOSPITAL_COMMUNITY)
Admission: EM | Admit: 2013-03-20 | Discharge: 2013-03-21 | Disposition: A | Discharge status: Home / Self Care | Payer: Medicaid Other | Attending: Emergency Medicine | Admitting: Emergency Medicine

## 2013-03-20 ENCOUNTER — Emergency Department (HOSPITAL_COMMUNITY)
Admission: EM | Admit: 2013-03-20 | Discharge: 2013-03-20 | Disposition: A | Discharge status: Nursing Home (Includes ICF) | Payer: Medicaid Other | Source: Home / Self Care | Attending: Emergency Medicine | Admitting: Emergency Medicine

## 2013-03-20 ENCOUNTER — Encounter (HOSPITAL_COMMUNITY): Payer: Self-pay | Admitting: *Deleted

## 2013-03-20 DIAGNOSIS — R51 Headache: Secondary | ICD-10-CM | POA: Insufficient documentation

## 2013-03-20 DIAGNOSIS — Z862 Personal history of diseases of the blood and blood-forming organs and certain disorders involving the immune mechanism: Secondary | ICD-10-CM | POA: Insufficient documentation

## 2013-03-20 DIAGNOSIS — Z79899 Other long term (current) drug therapy: Secondary | ICD-10-CM | POA: Insufficient documentation

## 2013-03-20 DIAGNOSIS — Z8742 Personal history of other diseases of the female genital tract: Secondary | ICD-10-CM | POA: Insufficient documentation

## 2013-03-20 DIAGNOSIS — K859 Acute pancreatitis without necrosis or infection, unspecified: Secondary | ICD-10-CM

## 2013-03-20 DIAGNOSIS — Z9089 Acquired absence of other organs: Secondary | ICD-10-CM | POA: Insufficient documentation

## 2013-03-20 DIAGNOSIS — Z8719 Personal history of other diseases of the digestive system: Secondary | ICD-10-CM | POA: Insufficient documentation

## 2013-03-20 DIAGNOSIS — R197 Diarrhea, unspecified: Secondary | ICD-10-CM | POA: Insufficient documentation

## 2013-03-20 DIAGNOSIS — G8929 Other chronic pain: Secondary | ICD-10-CM | POA: Insufficient documentation

## 2013-03-20 DIAGNOSIS — L299 Pruritus, unspecified: Secondary | ICD-10-CM | POA: Insufficient documentation

## 2013-03-20 DIAGNOSIS — R112 Nausea with vomiting, unspecified: Secondary | ICD-10-CM | POA: Insufficient documentation

## 2013-03-20 DIAGNOSIS — R1013 Epigastric pain: Secondary | ICD-10-CM | POA: Insufficient documentation

## 2013-03-20 DIAGNOSIS — Z87891 Personal history of nicotine dependence: Secondary | ICD-10-CM | POA: Insufficient documentation

## 2013-03-20 DIAGNOSIS — R6883 Chills (without fever): Secondary | ICD-10-CM | POA: Insufficient documentation

## 2013-03-20 LAB — COMPREHENSIVE METABOLIC PANEL
ALT: 26 U/L (ref 0–35)
AST: 36 U/L (ref 0–37)
Albumin: 3.8 g/dL (ref 3.5–5.2)
Alkaline Phosphatase: 78 U/L (ref 39–117)
BUN: 11 mg/dL (ref 6–23)
CO2: 22 mEq/L (ref 19–32)
Calcium: 9.3 mg/dL (ref 8.4–10.5)
Chloride: 105 mEq/L (ref 96–112)
Creatinine, Ser: 0.67 mg/dL (ref 0.50–1.10)
GFR calc Af Amer: >90 mL/min (ref >90)
GFR calc non Af Amer: >90 mL/min (ref >90)
Glucose, Bld: 95 mg/dL (ref 70–99)
Potassium: 3.7 mEq/L (ref 3.5–5.1)
Sodium: 139 mEq/L (ref 135–145)
Total Bilirubin: 0.2 mg/dL — ABNORMAL LOW (ref 0.3–1.2)
Total Protein: 7.5 g/dL (ref 6.0–8.3)

## 2013-03-20 LAB — CBC
HCT: 35.4 % — ABNORMAL LOW (ref 36.0–46.0)
Hemoglobin: 12.6 g/dL (ref 12.0–15.0)
MCH: 28.4 pg (ref 26.0–34.0)
MCHC: 35.6 g/dL (ref 30.0–36.0)
MCV: 79.9 fL (ref 78.0–100.0)
Platelets: 263 10*3/uL (ref 150–400)
RBC: 4.43 MIL/uL (ref 3.87–5.11)
RDW: 12 % (ref 11.5–15.5)
WBC: 7.2 10*3/uL (ref 4.0–10.5)

## 2013-03-20 LAB — LIPASE, BLOOD: Lipase: 41 U/L (ref 11–59)

## 2013-03-20 MED ORDER — METOCLOPRAMIDE HCL 5 MG/ML IJ SOLN
10.0000 mg | Freq: Once | INTRAMUSCULAR | Status: AC
  Administered 2013-03-20: 10 mg via INTRAVENOUS

## 2013-03-20 MED ORDER — DIPHENHYDRAMINE HCL 50 MG/ML IJ SOLN
INTRAMUSCULAR | Status: AC
  Filled 2013-03-20: qty 1

## 2013-03-20 MED ORDER — PROMETHAZINE HCL 25 MG/ML IJ SOLN
12.5000 mg | Freq: Once | INTRAMUSCULAR | Status: AC
  Administered 2013-03-21: 12.5 mg via INTRAVENOUS
  Filled 2013-03-20: qty 1

## 2013-03-20 MED ORDER — DIPHENHYDRAMINE HCL 50 MG/ML IJ SOLN
25.0000 mg | Freq: Once | INTRAMUSCULAR | Status: AC
  Administered 2013-03-21: 25 mg via INTRAVENOUS
  Filled 2013-03-20: qty 1

## 2013-03-20 MED ORDER — HYDROMORPHONE HCL PF 1 MG/ML IJ SOLN
2.0000 mg | Freq: Once | INTRAMUSCULAR | Status: AC
  Administered 2013-03-20: 2 mg via INTRAVENOUS

## 2013-03-20 MED ORDER — HYDROMORPHONE HCL PF 1 MG/ML IJ SOLN
INTRAMUSCULAR | Status: AC
  Filled 2013-03-20: qty 2

## 2013-03-20 MED ORDER — HYDROMORPHONE HCL PF 1 MG/ML IJ SOLN
1.0000 mg | Freq: Once | INTRAMUSCULAR | Status: AC
  Administered 2013-03-21: 1 mg via INTRAVENOUS
  Filled 2013-03-20: qty 1

## 2013-03-20 MED ORDER — DIPHENHYDRAMINE HCL 50 MG/ML IJ SOLN
25.0000 mg | Freq: Once | INTRAMUSCULAR | Status: AC
  Administered 2013-03-20: 25 mg via INTRAVENOUS

## 2013-03-20 MED ORDER — METOCLOPRAMIDE HCL 5 MG/ML IJ SOLN
INTRAMUSCULAR | Status: AC
  Filled 2013-03-20: qty 2

## 2013-03-20 MED ORDER — SODIUM CHLORIDE 0.9 % IV SOLN
INTRAVENOUS | Status: DC
  Administered 2013-03-20: 20:00:00 via INTRAVENOUS

## 2013-03-20 NOTE — ED Notes (Signed)
PA at bedside.

## 2013-03-20 NOTE — ED Notes (Signed)
MD at bedside. 

## 2013-03-20 NOTE — ED Notes (Signed)
Pt states the call button is broken, call light re-plugged in and working.

## 2013-03-20 NOTE — ED Notes (Signed)
Pt states she is in a lot of pain still, pt is annoyed.

## 2013-03-20 NOTE — ED Notes (Signed)
PA informed pt is in pain and is itchy.

## 2013-03-20 NOTE — ED Notes (Signed)
Iv  Ns  150  Ml  /  Hr  Via  24  Angio  L;  Hand

## 2013-03-20 NOTE — ED Notes (Signed)
Pt presented to urgent care for left flank and left upper quadrant pain associated with nausea, vomiting, and diarrhea. IV established and meds given.

## 2013-03-20 NOTE — ED Provider Notes (Signed)
Chief Complaint:   Chief Complaint  Patient presents with  . Abdominal Pain    History of Present Illness:    [name] is a 32 year old female who has had a history of recurring episodes of acute pancreatitis for the past 3 years. This was presumed due to gallbladder disease, but she had a cholecystectomy. She's continued to have episodes of pain ever since then. Current episode of pain has been going on for about 5 weeks which is cut worse yesterday. She was here at the emergency room less than 2 weeks ago for the same thing. The pain is localized to the left upper quadrant with radiation to the back. Sharp and rated 10 over 10 in intensity. It's been associated with nausea, vomiting of all by mouth intake, and diarrhea. There is no blood in the vomitus or stool. She's felt hot and cold but had no definite fever. She denies any use of alcohol.  Review of Systems:  Other than noted above, the patient denies any of the following symptoms: Constitutional:  No fever, chills, fatigue, weight loss or anorexia. Lungs:  No cough or shortness of breath. Heart:  No chest pain, palpitations, syncope or edema.  No cardiac history. Abdomen:  No nausea, vomiting, hematememesis, melena, diarrhea, or hematochezia. GU:  No dysuria, frequency, urgency, or hematuria. Gyn:  No vaginal discharge, itching, abnormal bleeding, dyspareunia, or pelvic pain.  PMFSH:  Past medical history, family history, social history, meds, and allergies were reviewed along with nurse's notes.  No prior abdominal surgeries, past history of GI problems, STDs or GYN problems.  No history of aspirin or NSAID use.  No excessive alcohol intake. She has a long list of intolerances and allergies. She's not taking any medications right now. She denies any other medical problems.  Physical Exam:   Vital signs:  BP 135/92  Pulse 74  Temp(Src) 98.7 F (37.1 C) (Oral)  Resp 14  SpO2 100%  LMP 03/09/2013 Gen:  Alert, oriented, in no  distress. Lungs:  Breath sounds clear and equal bilaterally.  No wheezes, rales or rhonchi. Heart:  Regular rhythm.  No gallops or murmers.   Abdomen:  Soft, flat, nondistended. There is moderate tenderness to palpation in the left upper quadrant with guarding and rebound and she has moderate tenderness to palpation over the entire abdomen. Bowel sounds are normally active. No organomegaly or mass. Skin:  Clear, warm and dry.  No rash.  Course in Urgent Care Center:   He was given Dilaudid 2 mg, Reglan 10 mg, and Benadryl 25 mg, all intravenously. Begun on IV normal saline at 150 mL per hour. Will transport via [facility].  Assessment:  The encounter diagnosis was Acute pancreatitis.  She's had frequently recurring episodes of pancreatitis. She's followed at [facility]. Will need IV fluids and pain meds for now.  Plan:   The patient was transferred to the ED via [facility] in stable condition.  Medical Decision Making:  32 year old female with recurring pancreatitis presents with 1 month history of severe LUQ abdominal pain, nausea, vomiting, and diarrhea.  She was just in the ED 2 weeks ago.  On exam she is tender in LUQ with guaarding and rebound.  BS are present.  She appears to have a recurrence of her pancreatitis.  Will send over to ED via [facility] on IV NS and give Dilaudid and Reglan.      [name], MD 03/20/13 [phone]

## 2013-03-20 NOTE — ED Provider Notes (Signed)
CSN: [id number]     Arrival date & time 03/20/13  2057 History   First MD Initiated Contact with Patient 03/20/13 2148     Chief Complaint  Patient presents with  . Abdominal Pain   (Consider location/radiation/quality/duration/timing/severity/associated sxs/prior Treatment) HPI 21F with history of pancreatitis presents to the emergency room with abdominal pain, nausea, vomiting, diarrhea, and pruritis which worsened yesterday. She says she had a 5 week history of upper abdominal pain that worsened; pain is a 10/10 which shoots to her back. Reports vomiting 5x/day of clear liquid and not being able to hold down any food or liquid. Reports ~6x/day episodes of watery diarrhea. Associated symptoms include chills, headache, and facial and arm itching. Denies fever, shortness of breath, cough, chest pain, heart palpitations. She has tried Reglan and Benadryl with no relief. Eating and drinking makes the abdominal pain worse. Denies alcohol use.  Past Medical History  Diagnosis Date  . Pancreatitis   . S/P laparoscopic cholecystectomy   . Fibroids   . Ovarian cyst   . Chronic abdominal pain   . Anemia    Past Surgical History  Procedure Laterality Date  . Cholecystectomy    . Dilation and curettage of uterus    . Ercp w/ metal stent placement     Family History  Problem Relation Age of Onset  . Diabetes Mother   . Hypertension Mother   . Hypertension Father   . Diabetes Father   . Asthma Daughter    History  Substance Use Topics  . Smoking status: Former Smoker -- 0.25 packs/day for 10 years    Types: Cigarettes    Quit date: 09/01/2012  . Smokeless tobacco: Never Used  . Alcohol Use: No   OB History   Grav Para Term Preterm Abortions TAB SAB Ect Mult Living   4 2 1 1 2  2         Review of Systems All other systems negative except as documented in the HPI. All pertinent positives and negatives as reviewed in the HPI.  Allergies  Celecoxib; Coconut flavor; Fentanyl;  Ibuprofen; Ketorolac tromethamine; Meperidine hcl; Morphine; Ondansetron; Other; Shellfish allergy; Tramadol; Robitussin (alcohol free); Ketamine; Propoxyphene-acetaminophen; and Tylenol  Home Medications   Current Outpatient Rx  Name  Route  Sig  Dispense  Refill  . estradiol (ESTRACE) 2 MG tablet   Oral   Take 2 mg by mouth at bedtime.         . promethazine (PHENERGAN) 25 MG tablet   Oral   Take 1 tablet (25 mg total) by mouth every 6 (six) hours as needed for nausea.   20 tablet   0    BP 113/85  Pulse 82  Temp(Src) 98.6 F (37 C) (Oral)  Resp 16  SpO2 100%  LMP 03/09/2013 Physical Exam  Nursing note and vitals reviewed. Constitutional: She is oriented to person, place, and time. Vital signs are normal. She appears well-developed and well-nourished.  HENT:  Head: Normocephalic and atraumatic.  Mouth/Throat: Uvula is midline, oropharynx is clear and moist and mucous membranes are normal.  Cardiovascular: Normal rate, regular rhythm, normal heart sounds, intact distal pulses and normal pulses.   Pulmonary/Chest: Effort normal and breath sounds normal. No respiratory distress. She exhibits no tenderness and no bony tenderness.  Abdominal: Soft. Normal appearance and bowel sounds are normal. There is tenderness (diffuse abdominal tenderness; worse in epigastric area) in the epigastric area. There is no rigidity and no guarding.    Neurological: She is  alert and oriented to person, place, and time.  Skin: Skin is warm and dry.  Psychiatric: She has a normal mood and affect. Her behavior is normal. Judgment and thought content normal.    ED Course  Procedures (including critical care time) Labs Review Labs Reviewed  CBC - Abnormal; Notable for the following:    HCT 35.4 (*)    All other components within normal limits  LIPASE, BLOOD  COMPREHENSIVE METABOLIC PANEL   Patient has a long-standing chronic abdominal pain, history patient is in her normal state of pain with  her chronic abdominal issues.  Patient's vital signs have been normal.  Patient has not had any vomiting here in the emergency department.  Patient is advised to follow up with her primary Dr. told to return here as needed.  The patient's lipase is also normal.  MDM   MDM Reviewed: nursing note, vitals and previous chart Reviewed previous: labs Interpretation: labs       Carlyle Dolly, PA-C 03/21/13 0100

## 2013-03-20 NOTE — ED Notes (Signed)
PT  REPORTS A  HISTORY  OF  PANCRATITIS         SHE  REPORTS   ABD  PAIN  WITH  NAUSEA          VOMITING           X  2  DAYS   HAS   HISTORY  OF  SAME                SHE  APPEARS  IN  PAIN      SKIN IS  WARM  /  DRY

## 2013-03-20 NOTE — ED Notes (Signed)
Phlebotomy at bedside.

## 2013-03-22 NOTE — ED Provider Notes (Signed)
Medical screening examination/treatment/procedure(s) were conducted as a shared visit with non-physician practitioner(s) and myself.  I personally evaluated the patient during the encounter.  No acute abdomen.  Screening tests within normal limits  Donnetta Hutching, MD 03/22/13 [phone]

## 2013-03-25 ENCOUNTER — Emergency Department (INDEPENDENT_AMBULATORY_CARE_PROVIDER_SITE_OTHER)
Admission: EM | Admit: 2013-03-25 | Discharge: 2013-03-25 | Disposition: A | Discharge status: Home / Self Care | Payer: Medicaid Other | Source: Home / Self Care | Attending: Emergency Medicine | Admitting: Emergency Medicine

## 2013-03-25 ENCOUNTER — Encounter (HOSPITAL_COMMUNITY): Payer: Self-pay | Admitting: Emergency Medicine

## 2013-03-25 DIAGNOSIS — K861 Other chronic pancreatitis: Secondary | ICD-10-CM

## 2013-03-25 LAB — POCT I-STAT, CHEM 8
BUN: 15 mg/dL (ref 6–23)
Calcium, Ion: 1.19 mmol/L (ref 1.12–1.23)
Creatinine, Ser: 1 mg/dL (ref 0.50–1.10)
Glucose, Bld: 94 mg/dL (ref 70–99)
TCO2: 26 mmol/L (ref 0–100)

## 2013-03-25 MED ORDER — TRAMADOL HCL 50 MG PO TABS: ORAL_TABLET | ORAL | Status: DC

## 2013-03-25 NOTE — ED Provider Notes (Signed)
Chief Complaint:   Chief Complaint  Patient presents with  . Abdominal Pain    History of Present Illness:    [name] is a 32 year old female with chronic pancreatitis who has been to the emergency room about 60 times in the past year. She was just here about 3 days ago with the same symptoms and was sent to the emergency department where she was found to have completely normal labs and vital signs and was sent home with no new medications. Right now she's taking Creon for her chronic pancreatitis along with some kind of acid blocker, she's not sure what. She also takes Reglan and a rectal suppository for nausea and vomiting. She describes today an ongoing history of severe abdominal pain rated 10 over 10 in intensity localized to the left and right upper quadrants radiating through the back. This is sharp and stabbing and sometimes aching. Nothing makes it better or worse. It's been associated with hot and cold sweats and nausea and vomiting. The patient estimates she's now is about 20 times per day and denies being able to keep anything on her stomach. She also has diarrhea with this with about 10 loose, watery stools per day. There's been no blood in the vomitus or the stool.  Review of Systems:  Other than noted above, the patient denies any of the following symptoms: Constitutional:  No fever, chills, fatigue, weight loss or anorexia. Lungs:  No cough or shortness of breath. Heart:  No chest pain, palpitations, syncope or edema.  No cardiac history. Abdomen:  No nausea, vomiting, hematememesis, melena, diarrhea, or hematochezia. GU:  No dysuria, frequency, urgency, or hematuria. Gyn:  No vaginal discharge, itching, abnormal bleeding, dyspareunia, or pelvic pain.  PMFSH:  Past medical history, family history, social history, meds, and allergies were reviewed along with nurse's notes.  No prior abdominal surgeries, past history of GI problems, STDs or GYN problems.  No history of aspirin  or NSAID use.  No excessive alcohol intake.   Physical Exam:   Vital signs:  BP 133/86  Pulse 84  Temp(Src) 97.7 F (36.5 C) (Oral)  Resp 20  SpO2 96%  LMP 03/09/2013 Filed Vitals:   03/25/13 1901 03/25/13 1931 Supine  03/25/13 1932 Sitting  03/25/13 1933 Standing   BP: 122/83 132/89 132/91 133/86  Pulse: 89 80 88 84  Temp: 97.7 F (36.5 C)     TempSrc: Oral     Resp: 20     SpO2: 96%      Gen:  Alert, oriented, in no distress. Lungs:  Breath sounds clear and equal bilaterally.  No wheezes, rales or rhonchi. Heart:  Regular rhythm.  No gallops or murmers.   Abdomen:  Abdomen is soft, flat, nondistended. There is generalized tenderness to palpation in all quadrants without guarding or rebound. No organomegaly or mass. Bowel sounds are normally active. Skin:  Clear, warm and dry.  No rash.  Labs:   Results for orders placed during the hospital encounter of 03/25/13  POCT I-STAT, CHEM 8      Result Value Range   Sodium 143  135 - 145 mEq/L   Potassium 3.9  3.5 - 5.1 mEq/L   Chloride 108  96 - 112 mEq/L   BUN 15  6 - 23 mg/dL   Creatinine, Ser 2.13  0.50 - 1.10 mg/dL   Glucose, Bld 94  70 - 99 mg/dL   Calcium, Ion 0.86  5.78 - 1.23 mmol/L   TCO2 26  0 - 100 mmol/L   Hemoglobin 14.3  12.0 - 15.0 g/dL   HCT 19.1  47.8 - 29.5 %     Assessment:  The encounter diagnosis was Chronic pancreatitis.  The patient does not have any biochemical or vital sign evidence of dehydration. She appears to have the same symptoms that she always has an is nothing different about her parents today than when she was here 3 days ago. She appears nontoxic and not in any distress. She was hoping to get a prescription for opioid pain medication, but I declined. She is at high risk for opioid dependency and addiction. I told her I would give her a small prescription for tramadol. She gives a history of breaking out in hives as tramadol, but would like to try it, taking Benadryl along with. She has many  drug allergies and intolerances. None of these have been anaphylaxis or life-threatening. She was told to keep her appointment with her gastroenterologist.  Plan:   1.  Meds:  The following meds were prescribed:   Discharge Medication List as of 03/25/2013  8:26 PM    START taking these medications   Details  traMADol (ULTRAM) 50 MG tablet 1 to 2 every 8 hours as needed for abdominal pain, Normal        2.  Patient Education/Counseling:  The patient was given appropriate handouts, self care instructions, and instructed in symptomatic relief.    3.  Follow up:  The patient was told to follow up if no better in 3 to 4 days, if becoming worse in any way, and given some red flag symptoms such as  fever or any blood in the vomitus or stool which would prompt immediate return.  Follow up  with her gastroenterologist.    [name], MD 03/25/13 2049

## 2013-03-25 NOTE — ED Notes (Signed)
Pt c/o LUQ pain and right flank pain x 6 weeks. This is a recurrent problem that she has been seen and treated for last week. Pt reports she got relief at the hospital but then symptoms relapsed. Pt is alert and in no acute distress.

## 2013-04-01 ENCOUNTER — Encounter (HOSPITAL_COMMUNITY): Payer: Self-pay | Admitting: Emergency Medicine

## 2013-04-01 ENCOUNTER — Emergency Department (HOSPITAL_COMMUNITY)
Admission: EM | Admit: 2013-04-01 | Discharge: 2013-04-01 | Disposition: A | Discharge status: Home / Self Care | Payer: Medicaid Other | Attending: Emergency Medicine | Admitting: Emergency Medicine

## 2013-04-01 DIAGNOSIS — Z9889 Other specified postprocedural states: Secondary | ICD-10-CM | POA: Insufficient documentation

## 2013-04-01 DIAGNOSIS — Z87891 Personal history of nicotine dependence: Secondary | ICD-10-CM | POA: Insufficient documentation

## 2013-04-01 DIAGNOSIS — G8929 Other chronic pain: Secondary | ICD-10-CM | POA: Insufficient documentation

## 2013-04-01 DIAGNOSIS — R1110 Vomiting, unspecified: Secondary | ICD-10-CM | POA: Insufficient documentation

## 2013-04-01 DIAGNOSIS — R109 Unspecified abdominal pain: Secondary | ICD-10-CM

## 2013-04-01 DIAGNOSIS — Z8719 Personal history of other diseases of the digestive system: Secondary | ICD-10-CM | POA: Insufficient documentation

## 2013-04-01 DIAGNOSIS — Z8742 Personal history of other diseases of the female genital tract: Secondary | ICD-10-CM | POA: Insufficient documentation

## 2013-04-01 DIAGNOSIS — Z862 Personal history of diseases of the blood and blood-forming organs and certain disorders involving the immune mechanism: Secondary | ICD-10-CM | POA: Insufficient documentation

## 2013-04-01 DIAGNOSIS — R1013 Epigastric pain: Secondary | ICD-10-CM | POA: Insufficient documentation

## 2013-04-01 DIAGNOSIS — Z9089 Acquired absence of other organs: Secondary | ICD-10-CM | POA: Insufficient documentation

## 2013-04-01 LAB — LIPASE, BLOOD: Lipase: 64 U/L — ABNORMAL HIGH (ref 11–59)

## 2013-04-01 LAB — HEPATIC FUNCTION PANEL
ALT: 23 U/L (ref 0–35)
Bilirubin, Direct: <0.1 mg/dL (ref 0.0–0.3)
Total Bilirubin: 0.2 mg/dL — ABNORMAL LOW (ref 0.3–1.2)

## 2013-04-01 LAB — CBC
MCH: 28.4 pg (ref 26.0–34.0)
MCV: 81 fL (ref 78.0–100.0)
Platelets: 260 10*3/uL (ref 150–400)
RDW: 12 % (ref 11.5–15.5)

## 2013-04-01 LAB — BASIC METABOLIC PANEL
Calcium: 9 mg/dL (ref 8.4–10.5)
Creatinine, Ser: 0.69 mg/dL (ref 0.50–1.10)
GFR calc Af Amer: >90 mL/min (ref >90)

## 2013-04-01 MED ORDER — METOCLOPRAMIDE HCL 10 MG PO TABS
10.0000 mg | ORAL_TABLET | Freq: Once | ORAL | Status: AC
  Administered 2013-04-01: 10 mg via ORAL
  Filled 2013-04-01: qty 1

## 2013-04-01 MED ORDER — DIPHENHYDRAMINE HCL 50 MG/ML IJ SOLN
25.0000 mg | Freq: Once | INTRAMUSCULAR | Status: AC
  Administered 2013-04-01: 25 mg via INTRAMUSCULAR
  Filled 2013-04-01: qty 1

## 2013-04-01 MED ORDER — DIPHENHYDRAMINE HCL 25 MG PO CAPS
50.0000 mg | ORAL_CAPSULE | Freq: Once | ORAL | Status: DC
  Filled 2013-04-01: qty 2

## 2013-04-01 MED ORDER — HYDROMORPHONE HCL PF 1 MG/ML IJ SOLN
1.0000 mg | Freq: Once | INTRAMUSCULAR | Status: AC
  Administered 2013-04-01: 1 mg via INTRAMUSCULAR
  Filled 2013-04-01: qty 1

## 2013-04-01 MED ORDER — OXYCODONE-ACETAMINOPHEN 5-325 MG PO TABS
2.0000 | ORAL_TABLET | Freq: Once | ORAL | Status: AC
  Administered 2013-04-01: 2 via ORAL
  Filled 2013-04-01: qty 2

## 2013-04-01 NOTE — ED Notes (Signed)
Pt refused to sign discharge, states she wants to talk the charge nurse, charge nurse notified.

## 2013-04-01 NOTE — ED Provider Notes (Signed)
CSN: [id number]     Arrival date & time 04/01/13  1922 History   First MD Initiated Contact with Patient 04/01/13 1929     Chief Complaint  Patient presents with  . Abdominal Pain   (Consider location/radiation/quality/duration/timing/severity/associated sxs/prior Treatment) Patient is a 32 y.o. female presenting with abdominal pain. The history is provided by the patient.  Abdominal Pain Pain location:  Epigastric Pain quality: sharp   Pain quality: not aching   Pain radiates to:  Does not radiate Pain severity:  Moderate Onset quality:  Gradual Duration:  6 weeks Timing:  Constant Progression:  Worsening Chronicity:  Chronic Context: recent illness (chronic pancreatitis)   Relieved by:  Nothing Worsened by:  Nothing tried Associated symptoms: vomiting   Associated symptoms: no cough, no fever and no shortness of breath     Past Medical History  Diagnosis Date  . Pancreatitis   . S/P laparoscopic cholecystectomy   . Fibroids   . Ovarian cyst   . Chronic abdominal pain   . Anemia    Past Surgical History  Procedure Laterality Date  . Cholecystectomy    . Dilation and curettage of uterus    . Ercp w/ metal stent placement     Family History  Problem Relation Age of Onset  . Diabetes Mother   . Hypertension Mother   . Hypertension Father   . Diabetes Father   . Asthma Daughter    History  Substance Use Topics  . Smoking status: Former Smoker -- 0.25 packs/day for 10 years    Types: Cigarettes    Quit date: 09/01/2012  . Smokeless tobacco: Never Used  . Alcohol Use: No   OB History   Grav Para Term Preterm Abortions TAB SAB Ect Mult Living   4 2 1 1 2  2         Review of Systems  Constitutional: Negative for fever.  Respiratory: Negative for cough and shortness of breath.   Gastrointestinal: Positive for vomiting and abdominal pain.  All other systems reviewed and are negative.    Allergies  Celecoxib; Coconut flavor; Fentanyl; Ibuprofen; Ketorolac  tromethamine; Meperidine hcl; Morphine; Ondansetron; Other; Shellfish allergy; Tramadol; Robitussin (alcohol free); Ketamine; Propoxyphene-acetaminophen; and Tylenol  Home Medications   Current Outpatient Rx  Name  Route  Sig  Dispense  Refill  . estradiol (ESTRACE) 2 MG tablet   Oral   Take 2 mg by mouth at bedtime.         . promethazine (PHENERGAN) 25 MG tablet   Oral   Take 1 tablet (25 mg total) by mouth every 6 (six) hours as needed for nausea.   20 tablet   0   . traMADol (ULTRAM) 50 MG tablet      1 to 2 every 8 hours as needed for abdominal pain   20 tablet   0    BP 121/71  Pulse 90  Temp(Src) 98.2 F (36.8 C) (Oral)  Resp 20  Ht 5\' 5"  (1.651 m)  Wt 174 lb 1 oz (78.954 kg)  BMI 28.97 kg/m2  SpO2 98%  LMP 03/09/2013 Physical Exam  Nursing note and vitals reviewed. Constitutional: She is oriented to person, place, and time. She appears well-developed and well-nourished. No distress.  HENT:  Head: Normocephalic and atraumatic.  Eyes: EOM are normal. Pupils are equal, round, and reactive to light.  Neck: Normal range of motion. Neck supple.  Cardiovascular: Normal rate and regular rhythm.  Exam reveals no friction rub.   No  murmur heard. Pulmonary/Chest: Effort normal and breath sounds normal. No respiratory distress. She has no wheezes. She has no rales.  Abdominal: Soft. She exhibits no distension. There is tenderness (epigastric). There is no rebound.  Musculoskeletal: Normal range of motion. She exhibits no edema.  Neurological: She is alert and oriented to person, place, and time.  Skin: She is not diaphoretic.    ED Course  Procedures (including critical care time) Labs Review Labs Reviewed  CBC  BASIC METABOLIC PANEL  LIPASE, BLOOD  HEPATIC FUNCTION PANEL   Imaging Review No results found.      MDM   1. Abdominal pain    64F with hx of chronic pancreatitis, seen at [facility], presents with abdominal pain. Epigastric, associated with  vomiting. No vomiting here. Denies fevers. Seen in ED multiple times for her abdominal pain.  Here, AFVSS, epigastric pain, no rebound or guarding. Will give PO pain meds. She vomited PO pain meds. IM Dilaudid given with some relief of her pain. Stable for discharge. Labs within normal limits.    Dagmar Hait, MD 04/01/13 2216

## 2013-04-01 NOTE — ED Notes (Signed)
Pt refused pills form med, states she might vomit.

## 2013-04-01 NOTE — ED Notes (Addendum)
Pt refused benadryl PO, states she can't take them because she can't swallow pills. Dr. Gwendolyn Grant notified, Dr. Gwendolyn Grant states to discharge the pt.

## 2013-04-01 NOTE — ED Notes (Signed)
Pt. reports chronic generalized abdominal pain with nausea , vomitting and diarrhea .

## 2013-04-16 ENCOUNTER — Encounter (HOSPITAL_COMMUNITY): Payer: Self-pay | Admitting: Emergency Medicine

## 2013-04-16 ENCOUNTER — Emergency Department (HOSPITAL_COMMUNITY)
Admission: EM | Admit: 2013-04-16 | Discharge: 2013-04-17 | Disposition: A | Discharge status: Home / Self Care | Payer: Medicaid Other | Attending: Emergency Medicine | Admitting: Emergency Medicine

## 2013-04-16 DIAGNOSIS — R109 Unspecified abdominal pain: Secondary | ICD-10-CM

## 2013-04-16 DIAGNOSIS — K861 Other chronic pancreatitis: Secondary | ICD-10-CM | POA: Insufficient documentation

## 2013-04-16 DIAGNOSIS — Z87891 Personal history of nicotine dependence: Secondary | ICD-10-CM | POA: Insufficient documentation

## 2013-04-16 DIAGNOSIS — Z862 Personal history of diseases of the blood and blood-forming organs and certain disorders involving the immune mechanism: Secondary | ICD-10-CM | POA: Insufficient documentation

## 2013-04-16 DIAGNOSIS — R1013 Epigastric pain: Secondary | ICD-10-CM | POA: Insufficient documentation

## 2013-04-16 DIAGNOSIS — Z9089 Acquired absence of other organs: Secondary | ICD-10-CM | POA: Insufficient documentation

## 2013-04-16 DIAGNOSIS — Z8742 Personal history of other diseases of the female genital tract: Secondary | ICD-10-CM | POA: Insufficient documentation

## 2013-04-16 DIAGNOSIS — G8929 Other chronic pain: Secondary | ICD-10-CM | POA: Insufficient documentation

## 2013-04-16 LAB — COMPREHENSIVE METABOLIC PANEL
AST: 22 U/L (ref 0–37)
Albumin: 4.1 g/dL (ref 3.5–5.2)
Alkaline Phosphatase: 58 U/L (ref 39–117)
BUN: 16 mg/dL (ref 6–23)
Creatinine, Ser: 0.55 mg/dL (ref 0.50–1.10)
Potassium: 3.9 mEq/L (ref 3.5–5.1)
Total Protein: 7.7 g/dL (ref 6.0–8.3)

## 2013-04-16 LAB — CBC
Hemoglobin: 12.4 g/dL (ref 12.0–15.0)
MCH: 27.8 pg (ref 26.0–34.0)
MCV: 80.7 fL (ref 78.0–100.0)
Platelets: 299 10*3/uL (ref 150–400)
RBC: 4.46 MIL/uL (ref 3.87–5.11)
RDW: 12.1 % (ref 11.5–15.5)
WBC: 10.1 10*3/uL (ref 4.0–10.5)

## 2013-04-16 LAB — LIPASE, BLOOD: Lipase: 235 U/L — ABNORMAL HIGH (ref 11–59)

## 2013-04-16 MED ORDER — PROMETHAZINE HCL 25 MG/ML IJ SOLN
25.0000 mg | Freq: Once | INTRAMUSCULAR | Status: DC
  Filled 2013-04-16: qty 1

## 2013-04-16 MED ORDER — SODIUM CHLORIDE 0.9 % IV BOLUS (SEPSIS)
1000.0000 mL | Freq: Once | INTRAVENOUS | Status: AC
  Administered 2013-04-17: 1000 mL via INTRAVENOUS

## 2013-04-16 MED ORDER — DIPHENHYDRAMINE HCL 50 MG/ML IJ SOLN
25.0000 mg | Freq: Once | INTRAMUSCULAR | Status: DC
  Filled 2013-04-16: qty 1

## 2013-04-16 MED ORDER — HYDROMORPHONE HCL PF 1 MG/ML IJ SOLN
1.0000 mg | Freq: Once | INTRAMUSCULAR | Status: DC
  Filled 2013-04-16: qty 1

## 2013-04-16 NOTE — ED Notes (Signed)
Pt states she does not want IM medications, pt states she wants an IV. PA informed.

## 2013-04-16 NOTE — ED Notes (Signed)
2 RNs attempted to start an IV on pt. IV team paged.

## 2013-04-16 NOTE — ED Notes (Signed)
IV team at bedside 

## 2013-04-16 NOTE — ED Provider Notes (Signed)
CSN: [id number]     Arrival date & time 04/16/13  1827 History   First MD Initiated Contact with Patient 04/16/13 2123     Chief Complaint  Patient presents with  . Abdominal Pain  . Nausea  . Emesis   (Consider location/radiation/quality/duration/timing/severity/associated sxs/prior Treatment) HPI Comments: Patient with hx of chronic pancreatitis, chronic abdominal pain, and fibroids. She is well known to ED for abdominal pain complaints. Was seen 4 times last month for same. She endorses no emesis today. Last bowel movement prior to arrival; normal in color and consistency.   Patient is a 32 y.o. female presenting with abdominal pain and vomiting. The history is provided by the patient. No language interpreter was used.  Abdominal Pain Pain location:  Epigastric and LUQ Pain quality: aching and sharp   Pain radiates to:  Does not radiate Pain severity:  Moderate Onset quality:  Gradual Duration:  2 days Timing:  Constant Progression:  Worsening Chronicity:  Chronic Context: previous surgery (Cholecystectomy, oophorectomy)   Context: not trauma   Relieved by:  None tried Worsened by:  Palpation and position changes Ineffective treatments:  None tried Associated symptoms: nausea and vomiting (Sporadic, NB/NB)   Associated symptoms: no chest pain, no diarrhea, no dysuria, no fever, no hematemesis, no hematochezia, no hematuria, no melena, no shortness of breath, no vaginal bleeding and no vaginal discharge   Emesis Associated symptoms: abdominal pain   Associated symptoms: no diarrhea     Past Medical History  Diagnosis Date  . Pancreatitis   . S/P laparoscopic cholecystectomy   . Fibroids   . Ovarian cyst   . Chronic abdominal pain   . Anemia    Past Surgical History  Procedure Laterality Date  . Cholecystectomy    . Dilation and curettage of uterus    . Ercp w/ metal stent placement    . Oophorectomy     Family History  Problem Relation Age of Onset  . Diabetes  Mother   . Hypertension Mother   . Hypertension Father   . Diabetes Father   . Asthma Daughter    History  Substance Use Topics  . Smoking status: Former Smoker -- 0.25 packs/day for 10 years    Types: Cigarettes    Quit date: 09/01/2012  . Smokeless tobacco: Never Used  . Alcohol Use: No   OB History   Grav Para Term Preterm Abortions TAB SAB Ect Mult Living   4 2 1 1 2  2         Review of Systems  Constitutional: Negative for fever.  Respiratory: Negative for shortness of breath.   Cardiovascular: Negative for chest pain.  Gastrointestinal: Positive for nausea, vomiting (Sporadic, NB/NB) and abdominal pain. Negative for diarrhea, blood in stool, melena, hematochezia and hematemesis.  Genitourinary: Negative for dysuria, hematuria, vaginal bleeding and vaginal discharge.  Neurological: Negative for syncope and weakness.  All other systems reviewed and are negative.    Allergies  Celecoxib; Coconut flavor; Fentanyl; Ibuprofen; Ketorolac tromethamine; Meperidine hcl; Morphine; Ondansetron; Other; Shellfish allergy; Tramadol; Propoxycaine; Robitussin (alcohol free); Ketamine; Propoxyphene-acetaminophen; and Tylenol  Home Medications   Current Outpatient Rx  Name  Route  Sig  Dispense  Refill  . estradiol (ESTRACE) 2 MG tablet   Oral   Take 2 mg by mouth at bedtime.         . promethazine (PHENERGAN) 25 MG tablet   Oral   Take 1 tablet (25 mg total) by mouth every 6 (six) hours as needed  for nausea.   13 tablet   0    BP 112/66  Pulse 73  Temp(Src) 98.1 F (36.7 C) (Oral)  Resp 18  Ht 5\' 5"  (1.651 m)  Wt 173 lb 11.2 oz (78.79 kg)  BMI 28.91 kg/m2  SpO2 98%  LMP 03/31/2013  Physical Exam  Nursing note and vitals reviewed. Constitutional: She is oriented to person, place, and time. She appears well-developed and well-nourished. No distress.  HENT:  Head: Normocephalic and atraumatic.  Eyes: Conjunctivae and EOM are normal. No scleral icterus.  Neck:  Normal range of motion.  Cardiovascular: Normal rate, regular rhythm and normal heart sounds.   Pulmonary/Chest: Effort normal and breath sounds normal. No respiratory distress. She has no wheezes. She has no rales.  Abdominal: Soft. She exhibits no distension and no mass. There is tenderness (epigastric, RUQ, LUQ). There is no rebound and no guarding.  No peritoneal signs or evidence of acute surgical abdomen  Musculoskeletal: Normal range of motion.  Neurological: She is alert and oriented to person, place, and time.  Skin: Skin is warm and dry. No rash noted. She is not diaphoretic. No erythema. No pallor.  Psychiatric: She has a normal mood and affect. Her behavior is normal.    ED Course  Procedures (including critical care time) Labs Review Labs Reviewed  LIPASE, BLOOD - Abnormal; Notable for the following:    Lipase 235 (*)    All other components within normal limits  CBC  COMPREHENSIVE METABOLIC PANEL   Imaging Review No results found.  EKG Interpretation   None       MDM   1. Chronic pancreatitis   2. Abdominal pain     Patient well-known to the emergency department for abdominal pain and chronic pancreatitis presents today for worsening epigastric pain. No peritoneal signs or involuntary guarding on physical exam. Without significant only for lipase of 235. No leukocytosis, anemia, or electrolyte imbalance. Liver and kidney function both preserved. Symptoms treated in ED with IV fluids, Benadryl, Dilaudid, and Phenergan with improvement in epigastric pain. Tolerating POs in ED and reexamination of the abdomen stable without worsening tenderness. Maximum Ranson criteria 2 correlating to 1% predicted mortality; therefore, believe patient is stable and appropriate for outpatient management of symptoms with GI follow up. Clear liquid diet advised and phenergan prescribed for nausea. Tylenol/ibuprofen for pain control. Return precautions discussed and patient agreeable to  plan with no unaddressed concerns.  Antony Madura, PA-C 04/19/13 2355

## 2013-04-16 NOTE — ED Notes (Signed)
Attempted IV, able to draw blood but IV would not thread into vein x 2.

## 2013-04-16 NOTE — ED Notes (Signed)
Abd pain, N/V/D for the last 3 weeks.

## 2013-04-17 MED ORDER — PROMETHAZINE HCL 25 MG PO TABS: 25.0000 mg | ORAL_TABLET | Freq: Four times a day (QID) | ORAL | Status: DC | PRN

## 2013-04-17 MED ORDER — HYDROMORPHONE HCL PF 1 MG/ML IJ SOLN
1.0000 mg | Freq: Once | INTRAMUSCULAR | Status: AC
  Administered 2013-04-17: 1 mg via INTRAVENOUS
  Filled 2013-04-17: qty 1

## 2013-04-17 MED ORDER — PROMETHAZINE HCL 25 MG/ML IJ SOLN
25.0000 mg | Freq: Once | INTRAMUSCULAR | Status: AC
  Administered 2013-04-17: 25 mg via INTRAVENOUS
  Filled 2013-04-17: qty 1

## 2013-04-17 MED ORDER — HYDROMORPHONE HCL PF 1 MG/ML IJ SOLN
1.0000 mg | Freq: Once | INTRAMUSCULAR | Status: AC
  Administered 2013-04-17: 1 mg via INTRAVENOUS

## 2013-04-17 MED ORDER — DIPHENHYDRAMINE HCL 50 MG/ML IJ SOLN
25.0000 mg | Freq: Once | INTRAMUSCULAR | Status: AC
  Administered 2013-04-17: 25 mg via INTRAVENOUS
  Filled 2013-04-17: qty 1

## 2013-04-17 MED ORDER — DIPHENHYDRAMINE HCL 50 MG/ML IJ SOLN
25.0000 mg | Freq: Once | INTRAMUSCULAR | Status: AC
  Administered 2013-04-17: 25 mg via INTRAVENOUS

## 2013-04-17 NOTE — ED Notes (Signed)
Pt reqeusting to speak to provider.

## 2013-04-17 NOTE — ED Notes (Signed)
PA at bedside.

## 2013-04-17 NOTE — ED Notes (Addendum)
Pt informed, provider unavailable. No further questions at this time.

## 2013-04-17 NOTE — ED Notes (Signed)
Must wait 30 minutes to discharge pt after giving pain medication.

## 2013-04-19 ENCOUNTER — Emergency Department (HOSPITAL_COMMUNITY)
Admission: EM | Admit: 2013-04-19 | Discharge: 2013-04-19 | Disposition: A | Discharge status: Home / Self Care | Payer: Medicaid Other | Attending: Emergency Medicine | Admitting: Emergency Medicine

## 2013-04-19 ENCOUNTER — Encounter (HOSPITAL_COMMUNITY): Payer: Self-pay | Admitting: Emergency Medicine

## 2013-04-19 DIAGNOSIS — R1013 Epigastric pain: Secondary | ICD-10-CM | POA: Insufficient documentation

## 2013-04-19 DIAGNOSIS — Z862 Personal history of diseases of the blood and blood-forming organs and certain disorders involving the immune mechanism: Secondary | ICD-10-CM | POA: Insufficient documentation

## 2013-04-19 DIAGNOSIS — Z8742 Personal history of other diseases of the female genital tract: Secondary | ICD-10-CM | POA: Insufficient documentation

## 2013-04-19 DIAGNOSIS — Z8719 Personal history of other diseases of the digestive system: Secondary | ICD-10-CM | POA: Insufficient documentation

## 2013-04-19 DIAGNOSIS — Z9889 Other specified postprocedural states: Secondary | ICD-10-CM | POA: Insufficient documentation

## 2013-04-19 DIAGNOSIS — R112 Nausea with vomiting, unspecified: Secondary | ICD-10-CM | POA: Insufficient documentation

## 2013-04-19 DIAGNOSIS — Z87891 Personal history of nicotine dependence: Secondary | ICD-10-CM | POA: Insufficient documentation

## 2013-04-19 DIAGNOSIS — G8929 Other chronic pain: Secondary | ICD-10-CM | POA: Insufficient documentation

## 2013-04-19 LAB — COMPREHENSIVE METABOLIC PANEL
Albumin: 3.7 g/dL (ref 3.5–5.2)
CO2: 21 mEq/L (ref 19–32)
Calcium: 8.6 mg/dL (ref 8.4–10.5)
Creatinine, Ser: 0.58 mg/dL (ref 0.50–1.10)
GFR calc Af Amer: >90 mL/min (ref >90)
GFR calc non Af Amer: >90 mL/min (ref >90)
Glucose, Bld: 94 mg/dL (ref 70–99)

## 2013-04-19 LAB — CBC WITH DIFFERENTIAL/PLATELET
Eosinophils Absolute: 0.3 10*3/uL (ref 0.0–0.7)
Eosinophils Relative: 3 % (ref 0–5)
HCT: 35.4 % — ABNORMAL LOW (ref 36.0–46.0)
Lymphocytes Relative: 33 % (ref 12–46)
Lymphs Abs: 3.5 10*3/uL (ref 0.7–4.0)
MCH: 27.9 pg (ref 26.0–34.0)
MCV: 80.8 fL (ref 78.0–100.0)
Monocytes Absolute: 0.8 10*3/uL (ref 0.1–1.0)
RBC: 4.38 MIL/uL (ref 3.87–5.11)
RDW: 11.8 % (ref 11.5–15.5)
WBC: 10.6 10*3/uL — ABNORMAL HIGH (ref 4.0–10.5)

## 2013-04-19 LAB — LIPASE, BLOOD: Lipase: 39 U/L (ref 11–59)

## 2013-04-19 MED ORDER — OXYCODONE HCL 5 MG PO TABS
10.0000 mg | ORAL_TABLET | Freq: Once | ORAL | Status: AC
  Administered 2013-04-19: 10 mg via ORAL
  Filled 2013-04-19: qty 2

## 2013-04-19 MED ORDER — OXYCODONE-ACETAMINOPHEN 5-325 MG PO TABS: 1.0000 | ORAL_TABLET | Freq: Four times a day (QID) | ORAL | Status: DC | PRN

## 2013-04-19 NOTE — ED Notes (Signed)
This is a 32yo female pt that was seen in [place] er 2 days ago, pt stated she was dx with pancreatitis, c/os of  Worsening abd pain and nausea and vomitimus, of which none has been seen tonight.

## 2013-04-19 NOTE — ED Provider Notes (Signed)
Medical screening examination/treatment/procedure(s) were performed by non-physician practitioner and as supervising physician I was immediately available for consultation/collaboration.   Sayf Kerner N Rhoda Waldvogel, DO 04/19/13 2356 

## 2013-04-19 NOTE — ED Notes (Signed)
Pt. reports nausea ,vomitting and diarrhea with generalized abdominal pain for several days .

## 2013-04-19 NOTE — ED Provider Notes (Signed)
Medical screening examination/treatment/procedure(s) were performed by non-physician practitioner and as supervising physician I was immediately available for consultation/collaboration.  Jasmine Awe, MD 04/19/13 [phone]

## 2013-04-19 NOTE — ED Provider Notes (Signed)
CSN: [id number]     Arrival date & time 04/19/13  0141 History   First MD Initiated Contact with Patient 04/19/13 0156     Chief Complaint  Patient presents with  . Abdominal Pain   (Consider location/radiation/quality/duration/timing/severity/associated sxs/prior Treatment) HPI  [name] is a 32 y.o.female with a significant PMH of chronic abdominal pain with multiple multiple visits to the ER for the same, pancreatitis, fibroids, ovarian cyst, chronic abdominal pains  presents to the ER with complaints of abdominal pains. She says she was seen two weeks ago and told that her liver enzymes and pancreatic enzymes were elevated. She says that they tried to convince her that she needed to be admitted for observation but she didn't want to stay. She comes back tonight because she said she was told to come back if her symptoms worsened. She says that the pain is worse and that she has not been keeping any foods or fluids down.  I told patient that I would not start an IV unless her labs returned showing dehydration or worsening of hers labs. She declined oral pain medications because she can not keep them down, she declined a shot of pain medication because she says she has too many knots on her arms now. So, we decided patient will wait until labs have resulted to see if she needs an IV.    Past Medical History  Diagnosis Date  . Pancreatitis   . S/P laparoscopic cholecystectomy   . Fibroids   . Ovarian cyst   . Chronic abdominal pain   . Anemia    Past Surgical History  Procedure Laterality Date  . Cholecystectomy    . Dilation and curettage of uterus    . Ercp w/ metal stent placement    . Oophorectomy     Family History  Problem Relation Age of Onset  . Diabetes Mother   . Hypertension Mother   . Hypertension Father   . Diabetes Father   . Asthma Daughter    History  Substance Use Topics  . Smoking status: Former Smoker -- 0.25 packs/day for 10 years    Types:  Cigarettes    Quit date: 09/01/2012  . Smokeless tobacco: Never Used  . Alcohol Use: No   OB History   Grav Para Term Preterm Abortions TAB SAB Ect Mult Living   4 2 1 1 2  2         Review of Systems  Review of Systems  Gen: no weight loss, fevers, chills, night sweats  Eyes: no discharge or drainage, no occular pain or visual changes  Nose: no epistaxis or rhinorrhea  Mouth: no dental pain, no sore throat  Neck: no neck pain  Lungs:No wheezing, coughing or hemoptysis CV: no chest pain, palpitations, dependent edema or orthopnea  Abd: + abdominal pain, nausea, vomiting  GU: no dysuria or gross hematuria  MSK:  No abnormalities  Neuro: no headache, no focal neurologic deficits  Skin: no abnormalities Psyche: negative.   Allergies  Celecoxib; Coconut flavor; Fentanyl; Ibuprofen; Ketorolac tromethamine; Meperidine hcl; Morphine; Ondansetron; Other; Shellfish allergy; Tramadol; Propoxycaine; Robitussin (alcohol free); Ketamine; Propoxyphene-acetaminophen; and Tylenol  Home Medications   Current Outpatient Rx  Name  Route  Sig  Dispense  Refill  . estradiol (ESTRACE) 2 MG tablet   Oral   Take 2 mg by mouth at bedtime.         . promethazine (PHENERGAN) 25 MG tablet   Oral   Take 1 tablet (  25 mg total) by mouth every 6 (six) hours as needed for nausea.   13 tablet   0   . oxyCODONE-acetaminophen (PERCOCET/ROXICET) 5-325 MG per tablet   Oral   Take 1 tablet by mouth every 6 (six) hours as needed for pain.   10 tablet   0    BP 120/65  Pulse 91  Temp(Src) 98.4 F (36.9 C) (Oral)  Resp 18  SpO2 100%  LMP 03/31/2013 Physical Exam  Nursing note and vitals reviewed. Constitutional: She appears well-developed and well-nourished. No distress.  HENT:  Head: Normocephalic and atraumatic.  Eyes: Pupils are equal, round, and reactive to light.  Neck: Normal range of motion. Neck supple.  Cardiovascular: Normal rate and regular rhythm.   Pulmonary/Chest: Effort  normal.  Abdominal: Soft. Bowel sounds are normal. She exhibits no distension and no ascites. There is tenderness in the epigastric area. There is no rigidity, no guarding, no CVA tenderness, no tenderness at McBurney's point and negative Murphy's sign.    Neurological: She is alert.  Skin: Skin is warm and dry.    ED Course  Procedures (including critical care time) Labs Review Labs Reviewed  CBC WITH DIFFERENTIAL - Abnormal; Notable for the following:    WBC 10.6 (*)    HCT 35.4 (*)    All other components within normal limits  COMPREHENSIVE METABOLIC PANEL - Abnormal; Notable for the following:    Total Bilirubin 0.2 (*)    All other components within normal limits  LIPASE, BLOOD   Imaging Review No results found.  EKG Interpretation   None       MDM   1. Chronic abdominal pain      Labs drawn, pt declined PO or IM medications. Has not been vomiting in ED, will continue to monitor and wait for lab results.  Labs are [facility] improved and are WNL. Pt offered PO and accepted since I said she would not be getting an IV. She requests pain medication Rx for home. Will give her 10 tabs for oxy and encourage her to see her doctor in [place].  32 y.o.[name]'s evaluation in the Emergency Department is complete. It has been determined that no acute conditions requiring further emergency intervention are present at this time. The patient/guardian have been advised of the diagnosis and plan. We have discussed signs and symptoms that warrant return to the ED, such as changes or worsening in symptoms.  Vital signs are stable at discharge. Filed Vitals:   04/19/13 0300  BP: 120/65  Pulse: 91  Temp:   Resp:     Patient/guardian has voiced understanding and agreed to follow-up with the PCP or specialist.   Dorthula Matas, PA-C 04/19/13 1610

## 2013-05-02 ENCOUNTER — Emergency Department (HOSPITAL_COMMUNITY)
Admission: EM | Admit: 2013-05-02 | Discharge: 2013-05-02 | Disposition: A | Discharge status: Home / Self Care | Payer: Medicaid Other | Attending: Emergency Medicine | Admitting: Emergency Medicine

## 2013-05-02 ENCOUNTER — Encounter (HOSPITAL_COMMUNITY): Payer: Self-pay | Admitting: Emergency Medicine

## 2013-05-02 DIAGNOSIS — R197 Diarrhea, unspecified: Secondary | ICD-10-CM | POA: Insufficient documentation

## 2013-05-02 DIAGNOSIS — F17200 Nicotine dependence, unspecified, uncomplicated: Secondary | ICD-10-CM | POA: Insufficient documentation

## 2013-05-02 DIAGNOSIS — G8929 Other chronic pain: Secondary | ICD-10-CM | POA: Insufficient documentation

## 2013-05-02 DIAGNOSIS — Z8742 Personal history of other diseases of the female genital tract: Secondary | ICD-10-CM | POA: Insufficient documentation

## 2013-05-02 DIAGNOSIS — R109 Unspecified abdominal pain: Secondary | ICD-10-CM | POA: Insufficient documentation

## 2013-05-02 DIAGNOSIS — Z9089 Acquired absence of other organs: Secondary | ICD-10-CM | POA: Insufficient documentation

## 2013-05-02 DIAGNOSIS — Z862 Personal history of diseases of the blood and blood-forming organs and certain disorders involving the immune mechanism: Secondary | ICD-10-CM | POA: Insufficient documentation

## 2013-05-02 DIAGNOSIS — R112 Nausea with vomiting, unspecified: Secondary | ICD-10-CM | POA: Insufficient documentation

## 2013-05-02 DIAGNOSIS — Z8719 Personal history of other diseases of the digestive system: Secondary | ICD-10-CM | POA: Insufficient documentation

## 2013-05-02 LAB — CBC WITH DIFFERENTIAL/PLATELET
Eosinophils Absolute: 0.3 10*3/uL (ref 0.0–0.7)
Eosinophils Relative: 4 % (ref 0–5)
HCT: 37.6 % (ref 36.0–46.0)
Hemoglobin: 13 g/dL (ref 12.0–15.0)
Lymphocytes Relative: 34 % (ref 12–46)
Lymphs Abs: 2.4 10*3/uL (ref 0.7–4.0)
MCH: 28.5 pg (ref 26.0–34.0)
Monocytes Relative: 9 % (ref 3–12)
Neutro Abs: 3.7 10*3/uL (ref 1.7–7.7)
Neutrophils Relative %: 53 % (ref 43–77)
Platelets: 258 10*3/uL (ref 150–400)
RBC: 4.56 MIL/uL (ref 3.87–5.11)
WBC: 6.9 10*3/uL (ref 4.0–10.5)

## 2013-05-02 LAB — COMPREHENSIVE METABOLIC PANEL
ALT: 54 U/L — ABNORMAL HIGH (ref 0–35)
AST: 36 U/L (ref 0–37)
Alkaline Phosphatase: 71 U/L (ref 39–117)
Calcium: 9.3 mg/dL (ref 8.4–10.5)
Chloride: 104 mEq/L (ref 96–112)
GFR calc non Af Amer: >90 mL/min (ref >90)
Glucose, Bld: 96 mg/dL (ref 70–99)
Potassium: 4.2 mEq/L (ref 3.5–5.1)
Sodium: 139 mEq/L (ref 135–145)
Total Bilirubin: 0.4 mg/dL (ref 0.3–1.2)
Total Protein: 8.1 g/dL (ref 6.0–8.3)

## 2013-05-02 LAB — LIPASE, BLOOD: Lipase: 23 U/L (ref 11–59)

## 2013-05-02 MED ORDER — DIPHENHYDRAMINE HCL 25 MG PO CAPS
25.0000 mg | ORAL_CAPSULE | Freq: Once | ORAL | Status: DC
  Filled 2013-05-02: qty 1

## 2013-05-02 MED ORDER — HYDROMORPHONE HCL PF 1 MG/ML IJ SOLN
1.0000 mg | Freq: Once | INTRAMUSCULAR | Status: AC
  Administered 2013-05-02: 1 mg via INTRAMUSCULAR
  Filled 2013-05-02: qty 1

## 2013-05-02 NOTE — ED Provider Notes (Signed)
CSN: [id number]     Arrival date & time 05/02/13  1136 History   First MD Initiated Contact with Patient 05/02/13 1236     Chief Complaint  Patient presents with  . Abdominal Pain   (Consider location/radiation/quality/duration/timing/severity/associated sxs/prior Treatment) HPI Comments: Patient presents to the emergency department with chief complaint of abdominal pain. She is well-known to this ED. She has been seen 38 times in the past 6 months. She has chronic abdominal pain, sometimes with elevated lipase. The patient was being seen in [place] by a pancreas specialist, but was recently referred to a specialist in [place]. Patient states that she has followup on November 3 with the specialist and a sterile. She states that her symptoms have been worsening over the past 3 weeks. She reports associated nausea, vomiting, diarrhea. She states the pain is moderate to severe. She states that she is not tolerating any oral intake. She is tried taking Reglan with no relief.  The history is provided by the patient. No language interpreter was used.    Past Medical History  Diagnosis Date  . Pancreatitis   . S/P laparoscopic cholecystectomy   . Fibroids   . Ovarian cyst   . Chronic abdominal pain   . Anemia    Past Surgical History  Procedure Laterality Date  . Cholecystectomy    . Dilation and curettage of uterus    . Ercp w/ metal stent placement    . Oophorectomy     Family History  Problem Relation Age of Onset  . Diabetes Mother   . Hypertension Mother   . Hypertension Father   . Diabetes Father   . Asthma Daughter    History  Substance Use Topics  . Smoking status: Current Every Day Smoker -- 0.25 packs/day for 10 years    Types: Cigarettes    Last Attempt to Quit: 09/01/2012  . Smokeless tobacco: Never Used  . Alcohol Use: No   OB History   Grav Para Term Preterm Abortions TAB SAB Ect Mult Living   4 2 1 1 2  2         Review of Systems  All other systems  reviewed and are negative.    Allergies  Celecoxib; Coconut flavor; Fentanyl; Ibuprofen; Ketorolac tromethamine; Meperidine hcl; Morphine; Ondansetron; Other; Shellfish allergy; Tramadol; Propoxycaine; Robitussin (alcohol free); Ketamine; Propoxyphene-acetaminophen; and Tylenol  Home Medications   Current Outpatient Rx  Name  Route  Sig  Dispense  Refill  . estradiol (ESTRACE) 2 MG tablet   Oral   Take 2 mg by mouth at bedtime.         . metoCLOPramide (REGLAN) 5 MG tablet   Oral   Take 5 mg by mouth 4 (four) times daily as needed (nausea).          BP 112/74  Pulse 88  Temp(Src) 98.1 F (36.7 C)  Resp 16  SpO2 99%  LMP 03/31/2013 Physical Exam  Nursing note and vitals reviewed. Constitutional: She is oriented to person, place, and time. She appears well-developed and well-nourished.  HENT:  Head: Normocephalic and atraumatic.  Eyes: Conjunctivae and EOM are normal. Pupils are equal, round, and reactive to light.  Neck: Normal range of motion. Neck supple.  Cardiovascular: Normal rate and regular rhythm.  Exam reveals no gallop and no friction rub.   No murmur heard. Pulmonary/Chest: Effort normal and breath sounds normal. No respiratory distress. She has no wheezes. She has no rales. She exhibits no tenderness.  Abdominal: Soft. She  exhibits no distension and no mass. There is no tenderness. There is no rebound and no guarding.  No right lower quadrant tenderness, no left lower quadrant tenderness, mild diffuse tenderness to the upper abdomen, but no focal tenderness, doubt surgical abdomen, no signs of fluid with peritonitis  Musculoskeletal: Normal range of motion. She exhibits no edema and no tenderness.  Neurological: She is alert and oriented to person, place, and time.  Skin: Skin is warm and dry.  Psychiatric: She has a normal mood and affect. Her behavior is normal. Judgment and thought content normal.    ED Course  Procedures (including critical care  time) Results for orders placed during the hospital encounter of 05/02/13  CBC WITH DIFFERENTIAL      Result Value Range   WBC 6.9  4.0 - 10.5 K/uL   RBC 4.56  3.87 - 5.11 MIL/uL   Hemoglobin 13.0  12.0 - 15.0 g/dL   HCT 40.9  81.1 - 91.4 %   MCV 82.5  78.0 - 100.0 fL   MCH 28.5  26.0 - 34.0 pg   MCHC 34.6  30.0 - 36.0 g/dL   RDW 78.2  95.6 - 21.3 %   Platelets 258  150 - 400 K/uL   Neutrophils Relative % 53  43 - 77 %   Neutro Abs 3.7  1.7 - 7.7 K/uL   Lymphocytes Relative 34  12 - 46 %   Lymphs Abs 2.4  0.7 - 4.0 K/uL   Monocytes Relative 9  3 - 12 %   Monocytes Absolute 0.6  0.1 - 1.0 K/uL   Eosinophils Relative 4  0 - 5 %   Eosinophils Absolute 0.3  0.0 - 0.7 K/uL   Basophils Relative 0  0 - 1 %   Basophils Absolute 0.0  0.0 - 0.1 K/uL  COMPREHENSIVE METABOLIC PANEL      Result Value Range   Sodium 139  135 - 145 mEq/L   Potassium 4.2  3.5 - 5.1 mEq/L   Chloride 104  96 - 112 mEq/L   CO2 23  19 - 32 mEq/L   Glucose, Bld 96  70 - 99 mg/dL   BUN 14  6 - 23 mg/dL   Creatinine, Ser 0.86  0.50 - 1.10 mg/dL   Calcium 9.3  8.4 - 57.8 mg/dL   Total Protein 8.1  6.0 - 8.3 g/dL   Albumin 4.1  3.5 - 5.2 g/dL   AST 36  0 - 37 U/L   ALT 54 (*) 0 - 35 U/L   Alkaline Phosphatase 71  39 - 117 U/L   Total Bilirubin 0.4  0.3 - 1.2 mg/dL   GFR calc non Af Amer >90  >90 mL/min   GFR calc Af Amer >90  >90 mL/min  LIPASE, BLOOD      Result Value Range   Lipase 23  11 - 59 U/L      EKG Interpretation   None       MDM   1. Chronic abdominal pain     Patient with chronic abdominal pain. Labs are reassuring. LFTs and lipase are close to or within normal limits. Will give a shot of pain medicine and oral challenge. Anticipate discharge to home with pancreas specialist followup in your home on November 3. Patient understands and agrees with this plan. There is no signs of surgical or acute abdomen.  Patient did not give urine sample.  Doubt UTI, no dysuria or low abdomen/pelvic  pain.  Patient states that she feels better with pain medicine. Discharge to home. Patient discussed with Dr. Deretha Emory, who agrees with the plan.     Roxy Horseman, PA-C 05/02/13 1551

## 2013-05-02 NOTE — ED Notes (Signed)
abd pain x   3 weeks getteing worse

## 2013-05-04 NOTE — ED Provider Notes (Signed)
Medical screening examination/treatment/procedure(s) were performed by non-physician practitioner and as supervising physician I was immediately available for consultation/collaboration.  EKG Interpretation   None         Shelda Jakes, MD 05/04/13 [phone]

## 2013-05-07 ENCOUNTER — Encounter (HOSPITAL_COMMUNITY): Payer: Self-pay | Admitting: Emergency Medicine

## 2013-05-07 ENCOUNTER — Emergency Department (HOSPITAL_COMMUNITY)
Admission: EM | Admit: 2013-05-07 | Discharge: 2013-05-07 | Disposition: A | Discharge status: Home / Self Care | Payer: Medicaid Other | Attending: Emergency Medicine | Admitting: Emergency Medicine

## 2013-05-07 DIAGNOSIS — G8929 Other chronic pain: Secondary | ICD-10-CM | POA: Insufficient documentation

## 2013-05-07 DIAGNOSIS — Z8719 Personal history of other diseases of the digestive system: Secondary | ICD-10-CM | POA: Insufficient documentation

## 2013-05-07 DIAGNOSIS — Z8742 Personal history of other diseases of the female genital tract: Secondary | ICD-10-CM | POA: Insufficient documentation

## 2013-05-07 DIAGNOSIS — R197 Diarrhea, unspecified: Secondary | ICD-10-CM | POA: Insufficient documentation

## 2013-05-07 DIAGNOSIS — D649 Anemia, unspecified: Secondary | ICD-10-CM | POA: Insufficient documentation

## 2013-05-07 DIAGNOSIS — F17200 Nicotine dependence, unspecified, uncomplicated: Secondary | ICD-10-CM | POA: Insufficient documentation

## 2013-05-07 DIAGNOSIS — R1013 Epigastric pain: Secondary | ICD-10-CM | POA: Insufficient documentation

## 2013-05-07 DIAGNOSIS — R112 Nausea with vomiting, unspecified: Secondary | ICD-10-CM

## 2013-05-07 DIAGNOSIS — Z79899 Other long term (current) drug therapy: Secondary | ICD-10-CM | POA: Insufficient documentation

## 2013-05-07 DIAGNOSIS — Z885 Allergy status to narcotic agent status: Secondary | ICD-10-CM | POA: Insufficient documentation

## 2013-05-07 DIAGNOSIS — Z9889 Other specified postprocedural states: Secondary | ICD-10-CM | POA: Insufficient documentation

## 2013-05-07 DIAGNOSIS — Z888 Allergy status to other drugs, medicaments and biological substances status: Secondary | ICD-10-CM | POA: Insufficient documentation

## 2013-05-07 LAB — CBC WITH DIFFERENTIAL/PLATELET
Basophils Absolute: 0 10*3/uL (ref 0.0–0.1)
Eosinophils Relative: 5 % (ref 0–5)
HCT: 36 % (ref 36.0–46.0)
Hemoglobin: 12.6 g/dL (ref 12.0–15.0)
Lymphocytes Relative: 40 % (ref 12–46)
Lymphs Abs: 2.7 10*3/uL (ref 0.7–4.0)
MCV: 81.4 fL (ref 78.0–100.0)
Monocytes Absolute: 0.6 10*3/uL (ref 0.1–1.0)
Monocytes Relative: 10 % (ref 3–12)
Neutro Abs: 3.1 10*3/uL (ref 1.7–7.7)
RBC: 4.42 MIL/uL (ref 3.87–5.11)
RDW: 11.9 % (ref 11.5–15.5)
WBC: 6.7 10*3/uL (ref 4.0–10.5)

## 2013-05-07 LAB — COMPREHENSIVE METABOLIC PANEL
AST: 29 U/L (ref 0–37)
Albumin: 4 g/dL (ref 3.5–5.2)
Alkaline Phosphatase: 73 U/L (ref 39–117)
BUN: 17 mg/dL (ref 6–23)
CO2: 25 mEq/L (ref 19–32)
Chloride: 106 mEq/L (ref 96–112)
Creatinine, Ser: 0.68 mg/dL (ref 0.50–1.10)
GFR calc Af Amer: >90 mL/min (ref >90)
GFR calc non Af Amer: >90 mL/min (ref >90)
Potassium: 3.6 mEq/L (ref 3.5–5.1)
Total Bilirubin: 0.2 mg/dL — ABNORMAL LOW (ref 0.3–1.2)

## 2013-05-07 MED ORDER — METOCLOPRAMIDE HCL 5 MG/ML IJ SOLN
10.0000 mg | Freq: Once | INTRAMUSCULAR | Status: AC
  Administered 2013-05-07: 10 mg via INTRAVENOUS
  Filled 2013-05-07: qty 2

## 2013-05-07 MED ORDER — SODIUM CHLORIDE 0.9 % IV BOLUS (SEPSIS)
1000.0000 mL | Freq: Once | INTRAVENOUS | Status: AC
  Administered 2013-05-07: 1000 mL via INTRAVENOUS

## 2013-05-07 MED ORDER — DIPHENHYDRAMINE HCL 50 MG/ML IJ SOLN
25.0000 mg | Freq: Once | INTRAMUSCULAR | Status: AC
  Administered 2013-05-07: 25 mg via INTRAVENOUS
  Filled 2013-05-07: qty 1

## 2013-05-07 NOTE — ED Notes (Signed)
C/o abd pain, vomiting and diarrhea increasingly worse over the past few days. Reports she was seen here for same last week and symptoms are getting worse

## 2013-05-07 NOTE — ED Notes (Signed)
IV team called to for IV start.

## 2013-05-07 NOTE — Progress Notes (Addendum)
ED CM noted patient  To be high frequency ED user. Pt has had 36 ED visits within the last 6 months. Pt has an ED Care Plan. Presented to ED with Abdominal Pain.  ED CM in to speak with patient. Pt reports that she goes to [facility]. She reports that she have been trying to get an referral for the pain clinic, she states, she was told that referral was made, but have not been notified of appointment.   She said  she is wanting to switch to another provider that can manage her care.  difficult to get . Patient has Medicaid coverage. Explained the process for having PCP switched. Discussed the importance and benefits of having a PCP, and not using the ED for primary care issues. Pt was instructed to contact her DSS CW that can assist her.  Provided patient with a list of PCP's in the [place] area that accepts [employer], including [facility] and [employer]. Provided verbal and written information to follow up. Patient provided permission to forward information to [facility], emailed 05/07/2013 to [employer]. Plan discussed with Dr. Gregary Cromer, Dr. Judd Lien and Karle Barr.   Pt agrees with plan.  Pt states she will contact her CW to start the process. Pt instructed to contact Barbaraann Boys at the [facility] if she is not contacted within 3-5 days. Pt verbalizes understanding.  No further ED CM needs identified.

## 2013-05-07 NOTE — ED Notes (Signed)
Pt c/o abd pain that started a few days ago, pt was seen here and sx have not gotten better. Pt states she has pancreatitis and is unable to keep anything down, having diarrhea. Pt states she normally gets IV pain medications for her sx. Pt rates pain 10/10. Pt states pain is in upper left quadrant but she is tender all over abd.

## 2013-05-07 NOTE — ED Provider Notes (Signed)
CSN: [id number]     Arrival date & time 05/07/13  1635 History   First MD Initiated Contact with Patient 05/07/13 1747     Chief Complaint  Patient presents with  . Abdominal Pain   (Consider location/radiation/quality/duration/timing/severity/associated sxs/prior Treatment) HPI 32 yo F here with abdominal pain pt states "feels like my pancreas again." Onset was gradual a few days ago, worsening.  The pain is severe, sharp, located to epigastrium. Modifying factors: pain worse with eating.  Associated symptoms: emesis, diarrhea.  Recent medical care: none. Pt states she has GI doctor in [place].   Past Medical History  Diagnosis Date  . Pancreatitis   . S/P laparoscopic cholecystectomy   . Fibroids   . Ovarian cyst   . Chronic abdominal pain   . Anemia    Past Surgical History  Procedure Laterality Date  . Cholecystectomy    . Dilation and curettage of uterus    . Ercp w/ metal stent placement    . Oophorectomy     Family History  Problem Relation Age of Onset  . Diabetes Mother   . Hypertension Mother   . Hypertension Father   . Diabetes Father   . Asthma Daughter    History  Substance Use Topics  . Smoking status: Current Every Day Smoker -- 0.25 packs/day for 10 years    Types: Cigarettes    Last Attempt to Quit: 09/01/2012  . Smokeless tobacco: Never Used  . Alcohol Use: No   OB History   Grav Para Term Preterm Abortions TAB SAB Ect Mult Living   4 2 1 1 2  2         Review of Systems Constitutional: Negative for fever.  Eyes: Negative for vision loss.  ENT: Negative for difficulty swallowing.  Cardiovascular: Negative for chest pain. Respiratory: Negative for respiratory distress.  Gastrointestinal:  Positive for vomiting.  Genitourinary: Negative for inability to void.  Musculoskeletal: Negative for gait problem.  Integumentary: Negative for rash.  Neurological: Negative for new focal weakness.     Allergies  Celecoxib; Coconut flavor; Fentanyl;  Ibuprofen; Ketorolac tromethamine; Meperidine hcl; Morphine; Ondansetron; Other; Shellfish allergy; Tramadol; Propoxycaine; Robitussin (alcohol free); Ketamine; Propoxyphene-acetaminophen; and Tylenol  Home Medications   Current Outpatient Rx  Name  Route  Sig  Dispense  Refill  . diphenhydrAMINE (BENADRYL) 50 MG capsule   Oral   Take 50 mg by mouth every 6 (six) hours as needed for itching.         . estradiol (ESTRACE) 2 MG tablet   Oral   Take 2 mg by mouth at bedtime.         . metoCLOPramide (REGLAN) 5 MG tablet   Oral   Take 5 mg by mouth 4 (four) times daily as needed (nausea).          BP 127/74  Pulse 76  Temp(Src) 98.4 F (36.9 C) (Oral)  Resp 22  Wt 171 lb 7 oz (77.764 kg)  SpO2 99%  LMP 03/31/2013 Physical Exam Nursing note and vitals reviewed.  Constitutional: Pt is alert and appears stated age. Eyes: No injection, no scleral icterus. HENT: Atraumatic, airway open without erythema or exudate.  Respiratory: No respiratory distress. Equal breathing bilaterally. Cardiovascular: Normal rate. Extremities warm and well perfused.  Abdomen: Soft, non-distended, epigastric tenderness without rebound or guarding. MSK: Extremities are atraumatic without deformity. Skin: No rash, no wounds.   Neuro: No motor nor sensory deficit.     ED Course  Procedures (including critical  care time) Labs Review Labs Reviewed  COMPREHENSIVE METABOLIC PANEL - Abnormal; Notable for the following:    ALT 36 (*)    Total Bilirubin 0.2 (*)    All other components within normal limits  CBC WITH DIFFERENTIAL  LIPASE, BLOOD  URINALYSIS, ROUTINE W REFLEX MICROSCOPIC   Imaging Review No results found.  EKG Interpretation   None      MDM   1. Nausea vomiting, diarrhea & abdominal pain    32 y.o. female w/ PMHx of chronic abdominal pain, pancreatitis presents w/ epigastric abdominal pain. Normal vitals, pt looks well. Care plan reviewed in chart. Pt looks well,  unremarkable vitals. She states she cannot keep anything down including PO reglan, benadryl at home. Pt concerned she is having pancreatitis flair. Do feel it is reasonable to check labs. Pt informed she will not receive narcotic meds or prescriptions. Given IV reglan, IVF, IV benadryl.   Discussed with care coordination nurse who spoke with patient who will attempt to get patient new PCP. Labs reviewed and were unremarkable including normal lipase. Pt doing well on re-eval. Soft abd. Pt requests pain meds but none given. Plan for outpatient follow up. Counseling provided regarding diagnosis, treatment plan, follow up recommendations, and return precautions. Questions answered.      I independently viewed, interpreted, and used in my medical decision making all ordered lab and imaging tests. Medical Decision Making discussed with ED attending Dr. Judd Lien.      Charm Barges, MD 05/07/13 [phone]

## 2013-05-07 NOTE — ED Notes (Signed)
IV team at bedside 

## 2013-05-08 NOTE — ED Provider Notes (Signed)
I saw and evaluated the patient, reviewed the resident's note and I agree with the findings and plan. Patient is a 32 year old female with past medical history of chronic abdominal pain and pancreatitis. She is well-known to the emergency department for frequent visits for the same. She presents today with epigastric discomfort but no fever.  On exam vitals are stable patient is afebrile. There is tenderness to palpation in the epigastrium without rebound or guarding. Bowel sounds are present. Heart is regular rate and rhythm and lungs are clear.  This patient is known to have a care plan for her chronic pain in the emergency department. She is complaining of severe discomfort , therefore laboratory studies were obtained these are essentially unremarkable with a normal lipase. She was informed she would not be receiving narcotic medications per care plan. She was given IV Reglan, fluids, and Benadryl and will be discharged to home.  EKG Interpretation   None        Geoffery Lyons, MD 05/08/13 [phone]

## 2013-05-12 ENCOUNTER — Emergency Department (INDEPENDENT_AMBULATORY_CARE_PROVIDER_SITE_OTHER)
Admission: EM | Admit: 2013-05-12 | Discharge: 2013-05-12 | Disposition: A | Discharge status: Home / Self Care | Payer: Medicaid Other | Source: Home / Self Care

## 2013-05-12 ENCOUNTER — Encounter (HOSPITAL_COMMUNITY): Payer: Self-pay | Admitting: Emergency Medicine

## 2013-05-12 DIAGNOSIS — K044 Acute apical periodontitis of pulpal origin: Secondary | ICD-10-CM

## 2013-05-12 DIAGNOSIS — K047 Periapical abscess without sinus: Secondary | ICD-10-CM

## 2013-05-12 MED ORDER — HYDROCODONE-ACETAMINOPHEN 5-325 MG PO TABS: 1.0000 | ORAL_TABLET | Freq: Four times a day (QID) | ORAL | Status: DC | PRN

## 2013-05-12 NOTE — ED Provider Notes (Signed)
CSN: [id number]     Arrival date & time 05/12/13  1115 History   None    Chief Complaint  Patient presents with  . Dental Pain   (Consider location/radiation/quality/duration/timing/severity/associated sxs/prior Treatment) Patient is a 32 y.o. female presenting with tooth pain.  Dental Pain   Dental pain: started 7 days ago. Broken tooth on bottom R. oragel w/o benefit once wore off. Has appt for dentist on Tue. Deneis fevers, n/v/d, rash, purulent or bloody discharge. Denies HA or neck stiffness.  PO preserved by eating on L side. Pt has broken piece of tooth where pain is locted from a botched extraction.   Seen for Pancreatitis on 05/07/13. Resolved as of today. Taking Creon  Can take vicodin w/o reaction .  Past Medical History  Diagnosis Date  . Pancreatitis   . S/P laparoscopic cholecystectomy   . Fibroids   . Ovarian cyst   . Chronic abdominal pain   . Anemia    Past Surgical History  Procedure Laterality Date  . Cholecystectomy    . Dilation and curettage of uterus    . Ercp w/ metal stent placement    . Oophorectomy     Family History  Problem Relation Age of Onset  . Diabetes Mother   . Hypertension Mother   . Hypertension Father   . Diabetes Father   . Asthma Daughter    History  Substance Use Topics  . Smoking status: Current Every Day Smoker -- 0.25 packs/day for 10 years    Types: Cigarettes    Last Attempt to Quit: 09/01/2012  . Smokeless tobacco: Never Used  . Alcohol Use: No   OB History   Grav Para Term Preterm Abortions TAB SAB Ect Mult Living   4 2 1 1 2  2         Review of Systems  All other systems reviewed and are negative.    Allergies  Celecoxib; Coconut flavor; Fentanyl; Ibuprofen; Ketorolac tromethamine; Meperidine hcl; Morphine; Ondansetron; Other; Shellfish allergy; Tramadol; Propoxycaine; Robitussin (alcohol free); Ketamine; Propoxyphene-acetaminophen; and Tylenol  Home Medications   Current Outpatient Rx  Name  Route  Sig   Dispense  Refill  . diphenhydrAMINE (BENADRYL) 50 MG capsule   Oral   Take 50 mg by mouth every 6 (six) hours as needed for itching.         . estradiol (ESTRACE) 2 MG tablet   Oral   Take 2 mg by mouth at bedtime.         Marland Kitchen HYDROcodone-acetaminophen (NORCO/VICODIN) 5-325 MG per tablet   Oral   Take 1 tablet by mouth every 6 (six) hours as needed for moderate pain.   15 tablet   0   . metoCLOPramide (REGLAN) 5 MG tablet   Oral   Take 5 mg by mouth 4 (four) times daily as needed (nausea).          BP 122/71  Pulse 75  Temp(Src) 98 F (36.7 C) (Oral)  Resp 16  SpO2 100%  LMP 04/30/2013 Physical Exam  Constitutional: She is oriented to person, place, and time. She appears well-developed and well-nourished. No distress.  HENT:  Head: Atraumatic.  R 2nd mandibular molar is not present but location is abnormal in appearance w/ irregular shape and erythema. TTP but no purulent dc.    Eyes: EOM are normal. Pupils are equal, round, and reactive to light.  Neck: Normal range of motion.  Cardiovascular: Normal rate, normal heart sounds and intact distal pulses.  Pulmonary/Chest: Effort normal and breath sounds normal. No respiratory distress.  Abdominal: Soft. She exhibits no distension.  Musculoskeletal: Normal range of motion.  Neurological: She is alert and oriented to person, place, and time.  Skin: Skin is warm and dry.  Psychiatric: She has a normal mood and affect. Her behavior is normal. Judgment and thought content normal.    ED Course  Procedures (including critical care time) Labs Review Labs Reviewed - No data to display Imaging Review No results found.  EKG Interpretation     Ventricular Rate:    PR Interval:    QRS Duration:   QT Interval:    QTC Calculation:   R Axis:     Text Interpretation:              MDM   1. Dental infection    32yo AAF w/ dental infection. Likely infected remainder of tooth under skin that has  reepithelialized over tooth.  Already w/ PCN at pharmacy that was Rxd by dentist. Pt simply has not filled Rx yet. Counseled pt to pick up PCN. Will Rx VIcodin. Pt states that this is safe to take.  - Vicodin - f/u Dentist on Tue - precautions givena nd all questions answered   Shelly Flatten, MD Family Medicine PGY-3 05/12/2013, 1:45 PM      Ozella Rocks, MD 05/12/13 1345

## 2013-05-12 NOTE — ED Notes (Signed)
32 yr old is here w/complaints of tooth pain - Right lower x  Week. She states she has contacted her dentist for referral to oral surgeon. PS: 10 Denies: fever

## 2013-05-13 NOTE — ED Provider Notes (Signed)
Medical screening examination/treatment/procedure(s) were performed by resident physician or non-physician practitioner and as supervising physician I was immediately available for consultation/collaboration.   KINDL,JAMES DOUGLAS MD.   James D Kindl, MD 05/13/13 0925 

## 2013-05-28 ENCOUNTER — Encounter (HOSPITAL_COMMUNITY): Payer: Self-pay | Admitting: Emergency Medicine

## 2013-05-28 ENCOUNTER — Emergency Department (HOSPITAL_COMMUNITY)
Admission: EM | Admit: 2013-05-28 | Discharge: 2013-05-28 | Disposition: A | Discharge status: Home / Self Care | Payer: Medicaid Other | Attending: Emergency Medicine | Admitting: Emergency Medicine

## 2013-05-28 DIAGNOSIS — Z862 Personal history of diseases of the blood and blood-forming organs and certain disorders involving the immune mechanism: Secondary | ICD-10-CM | POA: Insufficient documentation

## 2013-05-28 DIAGNOSIS — Z8719 Personal history of other diseases of the digestive system: Secondary | ICD-10-CM | POA: Insufficient documentation

## 2013-05-28 DIAGNOSIS — R110 Nausea: Secondary | ICD-10-CM | POA: Insufficient documentation

## 2013-05-28 DIAGNOSIS — F17200 Nicotine dependence, unspecified, uncomplicated: Secondary | ICD-10-CM | POA: Insufficient documentation

## 2013-05-28 DIAGNOSIS — Z8742 Personal history of other diseases of the female genital tract: Secondary | ICD-10-CM | POA: Insufficient documentation

## 2013-05-28 DIAGNOSIS — Z9089 Acquired absence of other organs: Secondary | ICD-10-CM | POA: Insufficient documentation

## 2013-05-28 DIAGNOSIS — G8929 Other chronic pain: Secondary | ICD-10-CM | POA: Insufficient documentation

## 2013-05-28 DIAGNOSIS — R109 Unspecified abdominal pain: Secondary | ICD-10-CM | POA: Insufficient documentation

## 2013-05-28 NOTE — ED Notes (Signed)
abd pain x 2 weeks states it is pancreatitis

## 2013-05-28 NOTE — ED Notes (Signed)
Pt left directly after talking to the doctor. She did not wait on discharge papers.

## 2013-05-28 NOTE — ED Provider Notes (Signed)
CSN: [id number]     Arrival date & time 05/28/13  3086 History   First MD Initiated Contact with Patient 05/28/13 [phone]     Chief Complaint  Patient presents with  . Abdominal Pain   (Consider location/radiation/quality/duration/timing/severity/associated sxs/prior Treatment) HPI 32 year old female with abdominal pain. Patient reports a history of "pancreatitis." History of chronic abdominal pain. She states that her current pain feels similar to previous exacerbations. Worsening over the past 2 weeks. Pain is the upper abdomen. Does not radiate. Relatively constant. Associated with nausea. No vomiting. No fevers or chills. No unusual vaginal bleeding or discharge. No urinary complaints. No fevers or chills. No sick contacts. Patient states that she has been using a heating pad, taking hot baths and using Tylenol for her pain but has not had much relief. Denies alcohol use. S/p cholecystectomy.    Past Medical History  Diagnosis Date  . Pancreatitis   . S/P laparoscopic cholecystectomy   . Fibroids   . Ovarian cyst   . Chronic abdominal pain   . Anemia    Past Surgical History  Procedure Laterality Date  . Cholecystectomy    . Dilation and curettage of uterus    . Ercp w/ metal stent placement    . Oophorectomy     Family History  Problem Relation Age of Onset  . Diabetes Mother   . Hypertension Mother   . Hypertension Father   . Diabetes Father   . Asthma Daughter    History  Substance Use Topics  . Smoking status: Current Every Day Smoker -- 0.25 packs/day for 10 years    Types: Cigarettes    Last Attempt to Quit: 09/01/2012  . Smokeless tobacco: Never Used  . Alcohol Use: No   OB History   Grav Para Term Preterm Abortions TAB SAB Ect Mult Living   4 2 1 1 2  2         Review of Systems  All systems reviewed and negative, other than as noted in HPI.   Allergies  Celecoxib; Coconut flavor; Fentanyl; Ibuprofen; Ketorolac tromethamine; Meperidine hcl; Morphine;  Ondansetron; Other; Shellfish allergy; Tramadol; Propoxycaine; Robitussin (alcohol free); Ketamine; Propoxyphene-acetaminophen; and Tylenol  Home Medications   Current Outpatient Rx  Name  Route  Sig  Dispense  Refill  . diphenhydrAMINE (BENADRYL) 50 MG capsule   Oral   Take 50 mg by mouth every 6 (six) hours as needed for itching.         . estradiol (ESTRACE) 2 MG tablet   Oral   Take 2 mg by mouth at bedtime.         . metoCLOPramide (REGLAN) 5 MG tablet   Oral   Take 5 mg by mouth 4 (four) times daily as needed (nausea).          BP 114/74  Pulse 71  Temp(Src) 98.4 F (36.9 C) (Oral)  Resp 14  SpO2 99%  LMP 04/30/2013 Physical Exam  Nursing note and vitals reviewed. Constitutional: She appears well-developed and well-nourished. No distress.  Sitting in bed. NAD. Appears comfortable. Laughing with friend at bedside.   HENT:  Head: Normocephalic and atraumatic.  Eyes: Conjunctivae are normal. Right eye exhibits no discharge. Left eye exhibits no discharge.  Neck: Neck supple.  Cardiovascular: Normal rate, regular rhythm and normal heart sounds.  Exam reveals no gallop and no friction rub.   No murmur heard. Pulmonary/Chest: Effort normal and breath sounds normal. No respiratory distress.  Abdominal: Soft. She exhibits no distension. There  is tenderness. There is no rebound and no guarding.  Mild diffuse tenderness w/o rebound or guarding.   Genitourinary:  No cva tenderness  Musculoskeletal: She exhibits no edema and no tenderness.  Neurological: She is alert.  Skin: Skin is warm and dry.  Psychiatric: She has a normal mood and affect. Her behavior is normal. Thought content normal.    ED Course  Procedures (including critical care time) Labs Review Labs Reviewed - No data to display Imaging Review No results found.  EKG Interpretation   None       MDM   1. Chronic abdominal pain    32 year old female with abdominal pain. Describes symptoms seem  consistent with her chronic pain. Patient appears comfortable. She is afebrile. Normotensive. Heart rate is in the 60s to 80s. She appears clinically well hydrated. Per review of records, patient has had extensive workup of her abdominal pain across several different medical systems. Without acute change in presenting symptoms today, I do not feel that emergent imaging her laboratory workup would be of much utility. I have a very low suspicion for emergent etiology of her current complaints. Patient was encouraged to followup with her outpatient providers and to establish with pain management. Return precautions were discussed.    Raeford Razor, MD 05/28/13 1014

## 2013-05-29 ENCOUNTER — Ambulatory Visit: Payer: Medicaid Other

## 2013-05-31 ENCOUNTER — Emergency Department (HOSPITAL_COMMUNITY)
Admission: EM | Admit: 2013-05-31 | Discharge: 2013-05-31 | Disposition: A | Discharge status: Home / Self Care | Payer: Medicaid Other | Attending: Emergency Medicine | Admitting: Emergency Medicine

## 2013-05-31 ENCOUNTER — Encounter (HOSPITAL_COMMUNITY): Payer: Self-pay | Admitting: Emergency Medicine

## 2013-05-31 DIAGNOSIS — F17200 Nicotine dependence, unspecified, uncomplicated: Secondary | ICD-10-CM | POA: Insufficient documentation

## 2013-05-31 DIAGNOSIS — Z8719 Personal history of other diseases of the digestive system: Secondary | ICD-10-CM | POA: Insufficient documentation

## 2013-05-31 DIAGNOSIS — Z8742 Personal history of other diseases of the female genital tract: Secondary | ICD-10-CM | POA: Insufficient documentation

## 2013-05-31 DIAGNOSIS — R109 Unspecified abdominal pain: Secondary | ICD-10-CM | POA: Insufficient documentation

## 2013-05-31 DIAGNOSIS — Z862 Personal history of diseases of the blood and blood-forming organs and certain disorders involving the immune mechanism: Secondary | ICD-10-CM | POA: Insufficient documentation

## 2013-05-31 DIAGNOSIS — G8929 Other chronic pain: Secondary | ICD-10-CM | POA: Insufficient documentation

## 2013-05-31 DIAGNOSIS — Z9089 Acquired absence of other organs: Secondary | ICD-10-CM | POA: Insufficient documentation

## 2013-05-31 NOTE — ED Provider Notes (Signed)
CSN: [id number]     Arrival date & time 05/31/13  1840 History   First MD Initiated Contact with Patient 05/31/13 1909     Chief Complaint  Patient presents with  . Abdominal Pain   (Consider location/radiation/quality/duration/timing/severity/associated sxs/prior Treatment) Patient is a 32 y.o. female presenting with abdominal pain.  Abdominal Pain  Pt with chronic abdominal pain reports exacerbation of same x 2 weeks. Seen at multiple medical centers for same in the past. Seen here on 11/24, benign abdomen, no labs. Care Everywhere indicates she was seen the following day at [facility] with normal labs, although she denies going there that day. No change in symptoms from typical presentation. Reports unable to keep down meds.   Past Medical History  Diagnosis Date  . Pancreatitis   . S/P laparoscopic cholecystectomy   . Fibroids   . Ovarian cyst   . Chronic abdominal pain   . Anemia    Past Surgical History  Procedure Laterality Date  . Cholecystectomy    . Dilation and curettage of uterus    . Ercp w/ metal stent placement    . Oophorectomy     Family History  Problem Relation Age of Onset  . Diabetes Mother   . Hypertension Mother   . Hypertension Father   . Diabetes Father   . Asthma Daughter    History  Substance Use Topics  . Smoking status: Current Every Day Smoker -- 0.25 packs/day for 10 years    Types: Cigarettes    Last Attempt to Quit: 09/01/2012  . Smokeless tobacco: Never Used  . Alcohol Use: No   OB History   Grav Para Term Preterm Abortions TAB SAB Ect Mult Living   4 2 1 1 2  2         Review of Systems  Gastrointestinal: Positive for abdominal pain.   All other systems reviewed and are negative except as noted in HPI.   Allergies  Celecoxib; Coconut flavor; Fentanyl; Ibuprofen; Ketorolac tromethamine; Meperidine hcl; Morphine; Ondansetron; Other; Shellfish allergy; Tramadol; Propoxycaine; Robitussin (alcohol free); Ketamine;  Propoxyphene-acetaminophen; and Tylenol  Home Medications   Current Outpatient Rx  Name  Route  Sig  Dispense  Refill  . diphenhydrAMINE (BENADRYL) 50 MG capsule   Oral   Take 50 mg by mouth every 6 (six) hours as needed for itching.         . estradiol (ESTRACE) 2 MG tablet   Oral   Take 2 mg by mouth at bedtime.         . metoCLOPramide (REGLAN) 5 MG tablet   Oral   Take 5 mg by mouth 4 (four) times daily as needed (nausea).          BP 119/85  Pulse 77  Temp(Src) 98 F (36.7 C) (Oral)  Resp 18  Ht 5\' 5"  (1.651 m)  Wt 163 lb (73.936 kg)  BMI 27.12 kg/m2  SpO2 98%  LMP 04/30/2013 Physical Exam  Nursing note and vitals reviewed. Constitutional: She is oriented to person, place, and time. She appears well-developed and well-nourished.  HENT:  Head: Normocephalic and atraumatic.  Eyes: EOM are normal. Pupils are equal, round, and reactive to light.  Neck: Normal range of motion. Neck supple.  Cardiovascular: Normal rate, normal heart sounds and intact distal pulses.   Pulmonary/Chest: Effort normal and breath sounds normal.  Abdominal: Bowel sounds are normal. She exhibits no distension. There is tenderness (mild diffuse). There is no rebound and no guarding.  Musculoskeletal:  Normal range of motion. She exhibits no edema and no tenderness.  Neurological: She is alert and oriented to person, place, and time. She has normal strength. No cranial nerve deficit or sensory deficit.  Skin: Skin is warm and dry. No rash noted.  Psychiatric: She has a normal mood and affect.    ED Course  Procedures (including critical care time) Labs Review Labs Reviewed - No data to display Imaging Review No results found.  EKG Interpretation   None       MDM   1. Chronic abdominal pain     Vitals normal. Exam benign. Chronic complaint. She denies having been at [facility] recently. Advised to use PR phenergan if unable to keep down PO. No clinical signs of dehydration  and no vomiting in the ED. Pt to be discharged, no EMC, no indication for additional ED labs or imaging. No narcotics.     Zale Marcotte B. Bernette Mayers, MD 05/31/13 [phone]

## 2013-05-31 NOTE — ED Notes (Signed)
Pt left before signing out and receiving paperwork from MD

## 2013-05-31 NOTE — ED Notes (Signed)
Pt c/o abd pain with nausea and vomiting x's 3 weeks.  Also c/o diarrhea

## 2013-06-01 NOTE — Progress Notes (Signed)
   CARE MANAGEMENT ED NOTE 06/01/2013  Patient:  [name], [name]   Account Number:  [id number]  Date Initiated:  06/01/2013  Documentation initiated by:  Edd Arbour  Subjective/Objective Assessment:   32 yr old [place] access pt with 30 ED visits and 1 Admission in the last 6 months per EPIC Pt inquired "where" CM informed her of the names of the hospitals associated  [facility] "pt stated "Ok, I don't know what you are talking about"     Subjective/Objective Assessment Detail:     Action/Plan:   Action/Plan Detail:   [facility] ED CM spoke with Kirtland Bouchard to inform her she has a [facility] medical care plan that the [facility] medical providers will follow throughout the [facility] facilities   Anticipated DC Date:  05/31/2013     Status Recommendation to Physician:   Result of Recommendation:    Other ED Services  Consult Working Plan    DC Planning Services  Other    Choice offered to / List presented to:            Status of service:  Completed, signed off  ED Comments:   ED Comments Detail:

## 2013-06-16 ENCOUNTER — Ambulatory Visit: Payer: Medicaid Other | Attending: Internal Medicine | Admitting: Internal Medicine

## 2013-06-16 VITALS — BP 137/80 | HR 72 | Temp 98.6°F | Resp 16 | Wt 164.4 lb

## 2013-06-16 DIAGNOSIS — G8929 Other chronic pain: Secondary | ICD-10-CM

## 2013-06-16 DIAGNOSIS — R109 Unspecified abdominal pain: Secondary | ICD-10-CM | POA: Insufficient documentation

## 2013-06-16 DIAGNOSIS — R1013 Epigastric pain: Secondary | ICD-10-CM

## 2013-06-16 LAB — POCT URINALYSIS DIPSTICK
Bilirubin, UA: NEGATIVE
Glucose, UA: NEGATIVE
Ketones, UA: NEGATIVE
Spec Grav, UA: 1.025
pH, UA: 6

## 2013-06-16 MED ORDER — PANCRELIPASE (LIP-PROT-AMYL) 36000-114000 UNITS PO CPEP: 2.0000 | ORAL_CAPSULE | Freq: Three times a day (TID) | ORAL | Status: DC

## 2013-06-16 NOTE — Progress Notes (Signed)
Patient ID: [name], female   DOB: 18-Apr-1981, 32 y.o.   MRN: [id number]   CC:  HPI:  32 year old female with a history of chronic abdominal pain, she status post laparoscopic cholecystectomy,chronic abdominal pain, possible drug-seeking behavior, documented Munchausen's syndrome, multiple listed medication allergies, several ED visits, probably 100 results in 2014 alone, who presents to the clinic to establish care.  She describes epigastric and left upper quadrant pain, chronic nausea,, chronic diarrhea. She states that she is compliant with a nausea medicine and Creon. She states that she was at [facility] on 12/10 and had EUS done, to evaluate her for chronic pancreatitis. In February 2014 she had pancreatic duct stent placed and removed subsequently. In June the patient was admitted for significant transaminitis in the 500s but normal lipase.  She was last seen by gastroenterology in [place] in June 2013, diagnosed with transaminitis secondary to fatty liver, possible alcohol/exogenous substance ingestion, and Munchausen's syndrome  GI recommended no further workup. Viral hepatitis serology was negative The patient states that she has been discharged from several [facility] clinics in the a.m. because of insurance issues 12/03/2012 abdominal ultrasound--fatty liver, CBD 7mm  She had multiple previous CT scans done  Allergies  Allergen Reactions  . Celecoxib Anaphylaxis and Swelling  . Coconut Flavor Shortness Of Breath and Swelling  . Fentanyl Anaphylaxis and Rash  . Ibuprofen Anaphylaxis and Rash  . Ketorolac Tromethamine Anaphylaxis  . Meperidine Hcl Anaphylaxis  . Morphine Anaphylaxis and Rash  . Ondansetron Anaphylaxis  . Other Shortness Of Breath and Swelling    GI cocktail  . Shellfish Allergy Anaphylaxis    Crab and lobster  . Tramadol Rash  . Propoxycaine Other (See Comments)    Throat swelling  . Robitussin (Alcohol Free) [Guaifenesin] Itching  . Ketamine  Itching  . Propoxyphene-Acetaminophen Rash  . Tylenol [Acetaminophen] Hives, Itching and Rash   Past Medical History  Diagnosis Date  . Pancreatitis   . S/P laparoscopic cholecystectomy   . Fibroids   . Ovarian cyst   . Chronic abdominal pain   . Anemia    Current Outpatient Prescriptions on File Prior to Visit  Medication Sig Dispense Refill  . diphenhydrAMINE (BENADRYL) 50 MG capsule Take 50 mg by mouth every 6 (six) hours as needed for itching.      . estradiol (ESTRACE) 2 MG tablet Take 2 mg by mouth at bedtime.      . metoCLOPramide (REGLAN) 5 MG tablet Take 5 mg by mouth 4 (four) times daily as needed (nausea).       No current facility-administered medications on file prior to visit.   Family History  Problem Relation Age of Onset  . Diabetes Mother   . Hypertension Mother   . Hypertension Father   . Diabetes Father   . Asthma Daughter    History   Social History  . Marital Status: Married    Spouse Name: N/A    Number of Children: N/A  . Years of Education: N/A   Occupational History  . stay at home mom    Social History Main Topics  . Smoking status: Current Every Day Smoker -- 0.25 packs/day for 10 years    Types: Cigarettes    Last Attempt to Quit: 09/01/2012  . Smokeless tobacco: Never Used  . Alcohol Use: No  . Drug Use: No  . Sexual Activity: Yes    Birth Control/ Protection: Surgical   Other Topics Concern  . Not on file  Social History Narrative  . No narrative on file    Review of Systems  Constitutional: Negative for fever, chills, diaphoresis, activity change, appetite change and fatigue.  HENT: Negative for ear pain, nosebleeds, congestion, facial swelling, rhinorrhea, neck pain, neck stiffness and ear discharge.   Eyes: Negative for pain, discharge, redness, itching and visual disturbance.  Respiratory: Negative for cough, choking, chest tightness, shortness of breath, wheezing and stridor.   Cardiovascular: Negative for chest pain,  palpitations and leg swelling.  Gastrointestinal: Negative for abdominal distention.  Genitourinary: Negative for dysuria, urgency, frequency, hematuria, flank pain, decreased urine volume, difficulty urinating and dyspareunia.  Musculoskeletal: Negative for back pain, joint swelling, arthralgias and gait problem.  Neurological: Negative for dizziness, tremors, seizures, syncope, facial asymmetry, speech difficulty, weakness, light-headedness, numbness and headaches.  Hematological: Negative for adenopathy. Does not bruise/bleed easily.  Psychiatric/Behavioral: Negative for hallucinations, behavioral problems, confusion, dysphoric mood, decreased concentration and agitation.    Objective:   Filed Vitals:   06/16/13 1143  BP: 137/80  Pulse: 72  Temp: 98.6 F (37 C)  Resp: 16    Physical Exam  Constitutional: Appears well-developed and well-nourished. No distress.  HENT: Normocephalic. External right and left ear normal. Oropharynx is clear and moist.  Eyes: Conjunctivae and EOM are normal. PERRLA, no scleral icterus.  Neck: Normal ROM. Neck supple. No JVD. No tracheal deviation. No thyromegaly.  CVS: RRR, S1/S2 +, no murmurs, no gallops, no carotid bruit.  Pulmonary: Effort and breath sounds normal, no stridor, rhonchi, wheezes, rales.  Abdominal: Soft. BS +,  no distension, tenderness, rebound or guarding.  Musculoskeletal: Normal range of motion. No edema and no tenderness.  Lymphadenopathy: No lymphadenopathy noted, cervical, inguinal. Neuro: Alert. Normal reflexes, muscle tone coordination. No cranial nerve deficit. Skin: Skin is warm and dry. No rash noted. Not diaphoretic. No erythema. No pallor.  Psychiatric: Normal mood and affect. Behavior, judgment, thought content normal.   Lab Results  Component Value Date   WBC 6.7 05/07/2013   HGB 12.6 05/07/2013   HCT 36.0 05/07/2013   MCV 81.4 05/07/2013   PLT 257 05/07/2013   Lab Results  Component Value Date   CREATININE 0.68  05/07/2013   BUN 17 05/07/2013   NA 140 05/07/2013   K 3.6 05/07/2013   CL 106 05/07/2013   CO2 25 05/07/2013    No results found for this basename: HGBA1C   Lipid Panel     Component Value Date/Time   CHOL 185 02/05/2011 0705   TRIG 100 02/05/2011 0705   HDL 66 02/05/2011 0705   CHOLHDL 2.8 02/05/2011 0705   VLDL 20 02/05/2011 0705   LDLCALC 99 02/05/2011 0705       Assessment and plan:   Patient Active Problem List   Diagnosis Date Noted  . Nausea vomiting, diarrhea & abdominal pain 12/03/2012  . Transaminitis 12/03/2012  . Acute pancreatitis 09/06/2011  . Nonspecific elevation of levels of transaminase or lactic acid dehydrogenase (LDH) 09/06/2011  . Nausea & vomiting 08/10/2011  . ANEMIA-NOS 10/28/2009  . MIGRAINE HEADACHE 10/28/2009  . GERD 10/28/2009  . UTI'S, HX OF 10/28/2009  . CHICKENPOX, HX OF 10/28/2009  . MUNCHAUSEN'S SYNDROME 10/26/2007  . INSOMNIA 10/16/2007  . ABDOMINAL PAIN 10/08/2007  . VOMITING 09/29/2007  . PANCREATITIS, CHRONIC 09/26/2007  . CHOLECYSTECTOMY, HX OF 04/13/2007  . SYMPTOM, PAIN, ABDOMINAL, RIGHT UP QUADRANT 02/14/2007  . TOTAL HYSTERECTOMY AND BILATERAL SALPINGOOPHERECTOMY, HX OF 02/14/2007  . DEPRESSION, MAJOR, RECURRENT 09/01/2006  . RHINITIS, ALLERGIC 09/01/2006  .  Unspecified Asthma 09/01/2006       Chronic abdominal pain Encouraged to continue Creon Last seen by Dr. Dulce Sellar in [place]  Recently seen in [facility] 12/10 for EUS Patient requesting tramadol but she is allergic to it Will repeat CMP, lipase, UDS Pain clinic referral, gastroenterology referral provided  Needs close outpatient monitoring to avoid further ED visits, ER hopping and physician shopping  Followup in 2 months   The patient was given clear instructions to go to ER or return to medical center if symptoms don't improve, worsen or new problems develop. The patient verbalized understanding. The patient was told to call to get any lab results if not heard  anything in the next week.

## 2013-06-16 NOTE — Progress Notes (Signed)
Patient here to establish care Hospital follow up- DX pacreatitis

## 2013-06-19 ENCOUNTER — Encounter (HOSPITAL_COMMUNITY): Payer: Self-pay | Admitting: Emergency Medicine

## 2013-06-19 ENCOUNTER — Emergency Department (HOSPITAL_COMMUNITY)
Admission: EM | Admit: 2013-06-19 | Discharge: 2013-06-19 | Disposition: A | Discharge status: Home / Self Care | Payer: Medicaid Other | Attending: Emergency Medicine | Admitting: Emergency Medicine

## 2013-06-19 DIAGNOSIS — Z8742 Personal history of other diseases of the female genital tract: Secondary | ICD-10-CM | POA: Insufficient documentation

## 2013-06-19 DIAGNOSIS — N390 Urinary tract infection, site not specified: Secondary | ICD-10-CM | POA: Insufficient documentation

## 2013-06-19 DIAGNOSIS — F17200 Nicotine dependence, unspecified, uncomplicated: Secondary | ICD-10-CM | POA: Insufficient documentation

## 2013-06-19 DIAGNOSIS — Z885 Allergy status to narcotic agent status: Secondary | ICD-10-CM | POA: Insufficient documentation

## 2013-06-19 DIAGNOSIS — R1084 Generalized abdominal pain: Secondary | ICD-10-CM | POA: Insufficient documentation

## 2013-06-19 DIAGNOSIS — Z79899 Other long term (current) drug therapy: Secondary | ICD-10-CM | POA: Insufficient documentation

## 2013-06-19 DIAGNOSIS — M545 Low back pain, unspecified: Secondary | ICD-10-CM | POA: Insufficient documentation

## 2013-06-19 DIAGNOSIS — D649 Anemia, unspecified: Secondary | ICD-10-CM | POA: Insufficient documentation

## 2013-06-19 DIAGNOSIS — Z888 Allergy status to other drugs, medicaments and biological substances status: Secondary | ICD-10-CM | POA: Insufficient documentation

## 2013-06-19 DIAGNOSIS — G8929 Other chronic pain: Secondary | ICD-10-CM

## 2013-06-19 DIAGNOSIS — Z8719 Personal history of other diseases of the digestive system: Secondary | ICD-10-CM | POA: Insufficient documentation

## 2013-06-19 DIAGNOSIS — R110 Nausea: Secondary | ICD-10-CM | POA: Insufficient documentation

## 2013-06-19 DIAGNOSIS — Z9889 Other specified postprocedural states: Secondary | ICD-10-CM | POA: Insufficient documentation

## 2013-06-19 LAB — URINALYSIS, ROUTINE W REFLEX MICROSCOPIC
Bilirubin Urine: NEGATIVE
Glucose, UA: NEGATIVE mg/dL
Ketones, ur: NEGATIVE mg/dL
Specific Gravity, Urine: 1.021 (ref 1.005–1.030)
Urobilinogen, UA: 0.2 mg/dL (ref 0.0–1.0)
pH: 5 (ref 5.0–8.0)

## 2013-06-19 LAB — URINE MICROSCOPIC-ADD ON

## 2013-06-19 MED ORDER — NITROFURANTOIN MONOHYD MACRO 100 MG PO CAPS: 100.0000 mg | ORAL_CAPSULE | Freq: Two times a day (BID) | ORAL | Status: DC

## 2013-06-19 MED ORDER — PHENAZOPYRIDINE HCL 200 MG PO TABS: 200.0000 mg | ORAL_TABLET | Freq: Three times a day (TID) | ORAL | Status: DC | PRN

## 2013-06-19 NOTE — ED Notes (Signed)
Pt c/o right flank pain around to abd. Hx of kidney stones. Denies any vomiting, only c/o nausea.

## 2013-06-20 ENCOUNTER — Encounter (HOSPITAL_COMMUNITY): Payer: Self-pay | Admitting: Emergency Medicine

## 2013-06-20 ENCOUNTER — Emergency Department (HOSPITAL_COMMUNITY)
Admission: EM | Admit: 2013-06-20 | Discharge: 2013-06-21 | Disposition: A | Discharge status: Home / Self Care | Payer: Medicaid Other | Attending: Emergency Medicine | Admitting: Emergency Medicine

## 2013-06-20 DIAGNOSIS — Z8742 Personal history of other diseases of the female genital tract: Secondary | ICD-10-CM | POA: Insufficient documentation

## 2013-06-20 DIAGNOSIS — Z8719 Personal history of other diseases of the digestive system: Secondary | ICD-10-CM | POA: Insufficient documentation

## 2013-06-20 DIAGNOSIS — T491X5A Adverse effect of antipruritics, initial encounter: Secondary | ICD-10-CM | POA: Insufficient documentation

## 2013-06-20 DIAGNOSIS — IMO0002 Reserved for concepts with insufficient information to code with codable children: Secondary | ICD-10-CM | POA: Insufficient documentation

## 2013-06-20 DIAGNOSIS — F17200 Nicotine dependence, unspecified, uncomplicated: Secondary | ICD-10-CM | POA: Insufficient documentation

## 2013-06-20 DIAGNOSIS — R6889 Other general symptoms and signs: Secondary | ICD-10-CM | POA: Insufficient documentation

## 2013-06-20 DIAGNOSIS — Z9089 Acquired absence of other organs: Secondary | ICD-10-CM | POA: Insufficient documentation

## 2013-06-20 DIAGNOSIS — T7840XA Allergy, unspecified, initial encounter: Secondary | ICD-10-CM

## 2013-06-20 DIAGNOSIS — Z862 Personal history of diseases of the blood and blood-forming organs and certain disorders involving the immune mechanism: Secondary | ICD-10-CM | POA: Insufficient documentation

## 2013-06-20 DIAGNOSIS — Z3202 Encounter for pregnancy test, result negative: Secondary | ICD-10-CM | POA: Insufficient documentation

## 2013-06-20 DIAGNOSIS — Z79899 Other long term (current) drug therapy: Secondary | ICD-10-CM | POA: Insufficient documentation

## 2013-06-20 DIAGNOSIS — N390 Urinary tract infection, site not specified: Secondary | ICD-10-CM | POA: Insufficient documentation

## 2013-06-20 LAB — POCT PREGNANCY, URINE: Preg Test, Ur: NEGATIVE

## 2013-06-20 LAB — URINE CULTURE: Colony Count: >=100000

## 2013-06-20 MED ORDER — DIPHENHYDRAMINE HCL 50 MG/ML IJ SOLN
50.0000 mg | Freq: Once | INTRAMUSCULAR | Status: AC
  Administered 2013-06-20: 50 mg via INTRAMUSCULAR
  Filled 2013-06-20: qty 1

## 2013-06-20 MED ORDER — PREDNISONE 20 MG PO TABS
60.0000 mg | ORAL_TABLET | Freq: Once | ORAL | Status: AC
  Administered 2013-06-20: 60 mg via ORAL
  Filled 2013-06-20: qty 3

## 2013-06-20 MED ORDER — SODIUM CHLORIDE 0.9 % IV SOLN: INTRAVENOUS | Status: DC

## 2013-06-20 MED ORDER — SODIUM CHLORIDE 0.9 % IV BOLUS (SEPSIS): 1000.0000 mL | Freq: Once | INTRAVENOUS | Status: DC

## 2013-06-20 MED ORDER — DIPHENHYDRAMINE HCL 50 MG/ML IJ SOLN: 50.0000 mg | Freq: Once | INTRAMUSCULAR | Status: DC

## 2013-06-20 MED ORDER — HYDROMORPHONE HCL PF 1 MG/ML IJ SOLN
1.0000 mg | Freq: Once | INTRAMUSCULAR | Status: AC
  Administered 2013-06-20: 1 mg via INTRAMUSCULAR
  Filled 2013-06-20: qty 1

## 2013-06-20 NOTE — ED Notes (Signed)
Abd. Pain for  6 days. Taking phenazopyridine, which is causing nausea and sob, and throat closing up. Airway intact.

## 2013-06-20 NOTE — ED Provider Notes (Signed)
CSN: [id number]     Arrival date & time 06/19/13  0009 History   First MD Initiated Contact with Patient 06/19/13 [phone]     Chief Complaint  Patient presents with  . Flank Pain   (Consider location/radiation/quality/duration/timing/severity/associated sxs/prior Treatment) HPI Patient has a history of chronic abdominal pain as presents to the emergency department multiple times for similar reasons. She's had multiple imaging studies. She presents today with her chronic ongoing pain which is unchanged. She does have new onset dysuria and frequency. She has no vomiting or diarrhea. She's had normal bowel movements. She does complain of mild right flank pain. Past Medical History  Diagnosis Date  . Pancreatitis   . S/P laparoscopic cholecystectomy   . Fibroids   . Ovarian cyst   . Chronic abdominal pain   . Anemia    Past Surgical History  Procedure Laterality Date  . Cholecystectomy    . Dilation and curettage of uterus    . Ercp w/ metal stent placement    . Oophorectomy     Family History  Problem Relation Age of Onset  . Diabetes Mother   . Hypertension Mother   . Hypertension Father   . Diabetes Father   . Asthma Daughter    History  Substance Use Topics  . Smoking status: Current Every Day Smoker -- 0.25 packs/day for 10 years    Types: Cigarettes    Last Attempt to Quit: 09/01/2012  . Smokeless tobacco: Never Used  . Alcohol Use: No   OB History   Grav Para Term Preterm Abortions TAB SAB Ect Mult Living   4 2 1 1 2  2         Review of Systems  Constitutional: Negative for fever and chills.  Respiratory: Negative for shortness of breath.   Cardiovascular: Negative for chest pain, palpitations and leg swelling.  Gastrointestinal: Positive for nausea and abdominal pain. Negative for vomiting, diarrhea and constipation.  Genitourinary: Positive for flank pain. Negative for dysuria, frequency, hematuria, vaginal bleeding, vaginal discharge, difficulty urinating and  pelvic pain.  Musculoskeletal: Positive for back pain. Negative for neck pain and neck stiffness.  Skin: Negative for rash and wound.  Neurological: Negative for dizziness, weakness, light-headedness, numbness and headaches.  All other systems reviewed and are negative.    Allergies  Celecoxib; Coconut flavor; Fentanyl; Ibuprofen; Ketorolac tromethamine; Meperidine hcl; Morphine; Ondansetron; Other; Shellfish allergy; Tramadol; Propoxycaine; Robitussin (alcohol free); Ketamine; Propoxyphene-acetaminophen; and Tylenol  Home Medications   Current Outpatient Rx  Name  Route  Sig  Dispense  Refill  . estradiol (ESTRACE) 2 MG tablet   Oral   Take 2 mg by mouth at bedtime.         . nitrofurantoin, macrocrystal-monohydrate, (MACROBID) 100 MG capsule   Oral   Take 1 capsule (100 mg total) by mouth 2 (two) times daily.   10 capsule   0   . phenazopyridine (PYRIDIUM) 200 MG tablet   Oral   Take 1 tablet (200 mg total) by mouth 3 (three) times daily as needed for pain.   10 tablet   0    BP 105/67  Pulse 63  Temp(Src) 98 F (36.7 C) (Oral)  Resp 18  Ht 5\' 5"  (1.651 m)  Wt 167 lb 7 oz (75.949 kg)  BMI 27.86 kg/m2  SpO2 100%  LMP 04/30/2013 Physical Exam  Nursing note and vitals reviewed. Constitutional: She is oriented to person, place, and time. She appears well-developed and well-nourished. No distress.  HENT:  Head: Normocephalic and atraumatic.  Mouth/Throat: Oropharynx is clear and moist.  Eyes: EOM are normal. Pupils are equal, round, and reactive to light.  Neck: Normal range of motion. Neck supple.  Cardiovascular: Normal rate and regular rhythm.   Pulmonary/Chest: Effort normal and breath sounds normal. No respiratory distress. She has no wheezes. She has no rales.  Abdominal: Soft. Bowel sounds are normal. She exhibits no distension and no mass. There is tenderness (mild diffuse abdominal tenderness without focality. No rebound or guarding.). There is no rebound  and no guarding.  Musculoskeletal: Normal range of motion. She exhibits tenderness (mild right lumbar paraspinal tenderness. No midline tenderness.). She exhibits no edema.  Neurological: She is alert and oriented to person, place, and time.  Skin: Skin is warm and dry. No rash noted. No erythema.  Psychiatric: She has a normal mood and affect. Her behavior is normal.    ED Course  Procedures (including critical care time) Labs Review Labs Reviewed  URINALYSIS, ROUTINE W REFLEX MICROSCOPIC - Abnormal; Notable for the following:    APPearance CLOUDY (*)    Hgb urine dipstick TRACE (*)    Nitrite POSITIVE (*)    Leukocytes, UA MODERATE (*)    All other components within normal limits  URINE MICROSCOPIC-ADD ON - Abnormal; Notable for the following:    Squamous Epithelial / LPF FEW (*)    Bacteria, UA FEW (*)    All other components within normal limits  URINE CULTURE   Imaging Review No results found.  EKG Interpretation   None       MDM   1. UTI (urinary tract infection)   2. Chronic abdominal pain    Patient is very well-appearing. I don't believe the patient has pyelonephritis. Her tenderness appears to be more paraspinal and actually flank.  Her abdomen is soft but any focal rebounding or guarding. We'll treat with antibiotics for UTI and given patient return precautions.    Loren Racer, MD 06/20/13 [phone]

## 2013-06-20 NOTE — ED Notes (Signed)
Pt c/o rash caused by medication given to her yesterday for UTI, (phenazopyridine)

## 2013-06-20 NOTE — ED Notes (Signed)
Pt stated the no rash was present, she just had problems swallowing and itching

## 2013-06-21 ENCOUNTER — Ambulatory Visit: Payer: Self-pay | Admitting: Internal Medicine

## 2013-06-21 MED ORDER — PREDNISONE 50 MG PO TABS: 50.0000 mg | ORAL_TABLET | Freq: Every day | ORAL | Status: DC

## 2013-06-21 MED ORDER — DIPHENHYDRAMINE HCL 25 MG PO CAPS: 25.0000 mg | ORAL_CAPSULE | Freq: Four times a day (QID) | ORAL | Status: DC | PRN

## 2013-06-23 NOTE — ED Provider Notes (Signed)
CSN: [id number]     Arrival date & time 06/20/13  1902 History   First MD Initiated Contact with Patient 06/20/13 2302     Chief Complaint  Patient presents with  . Abdominal Pain   (Consider location/radiation/quality/duration/timing/severity/associated sxs/prior Treatment) HPI Comments: Pt comes in with cc of tightness to her throat.  States that she was recently diagnosed with UTI, and after she took pyridium, she had a funny feeling in her mouth. She took one more dose, and her sx got more severe. She also has been having diffuse itching. No wheezing. No drooling, no dib. No known allergies to pyridium.  Patient is a 32 y.o. female presenting with abdominal pain. The history is provided by the patient.  Abdominal Pain Associated symptoms: no chest pain     Past Medical History  Diagnosis Date  . Pancreatitis   . S/P laparoscopic cholecystectomy   . Fibroids   . Ovarian cyst   . Chronic abdominal pain   . Anemia    Past Surgical History  Procedure Laterality Date  . Cholecystectomy    . Dilation and curettage of uterus    . Ercp w/ metal stent placement    . Oophorectomy     Family History  Problem Relation Age of Onset  . Diabetes Mother   . Hypertension Mother   . Hypertension Father   . Diabetes Father   . Asthma Daughter    History  Substance Use Topics  . Smoking status: Current Every Day Smoker -- 0.25 packs/day for 10 years    Types: Cigarettes    Last Attempt to Quit: 09/01/2012  . Smokeless tobacco: Never Used  . Alcohol Use: No   OB History   Grav Para Term Preterm Abortions TAB SAB Ect Mult Living   4 2 1 1 2  2         Review of Systems  Constitutional: Positive for activity change.  HENT: Negative for drooling.   Eyes: Negative for visual disturbance.  Respiratory: Negative for wheezing.   Cardiovascular: Negative for chest pain.  Gastrointestinal: Negative for abdominal pain.  Skin: Negative for rash.    Allergies  Celecoxib; Coconut  flavor; Fentanyl; Ibuprofen; Ketorolac tromethamine; Meperidine hcl; Morphine; Ondansetron; Other; Shellfish allergy; Tramadol; Propoxycaine; Robitussin (alcohol free); Ketamine; Propoxyphene-acetaminophen; and Tylenol  Home Medications   Current Outpatient Rx  Name  Route  Sig  Dispense  Refill  . estradiol (ESTRACE) 2 MG tablet   Oral   Take 2 mg by mouth at bedtime.         . nitrofurantoin, macrocrystal-monohydrate, (MACROBID) 100 MG capsule   Oral   Take 1 capsule (100 mg total) by mouth 2 (two) times daily.   10 capsule   0   . phenazopyridine (PYRIDIUM) 200 MG tablet   Oral   Take 1 tablet (200 mg total) by mouth 3 (three) times daily as needed for pain.   10 tablet   0   . diphenhydrAMINE (BENADRYL) 25 mg capsule   Oral   Take 1 capsule (25 mg total) by mouth every 6 (six) hours as needed for itching.   15 capsule   0   . predniSONE (DELTASONE) 50 MG tablet   Oral   Take 1 tablet (50 mg total) by mouth daily.   5 tablet   0    BP 104/45  Pulse 81  Temp(Src) 97.9 F (36.6 C) (Oral)  Resp 16  Ht 5\' 5"  (1.651 m)  SpO2 98%  LMP  04/30/2013 Physical Exam  Nursing note and vitals reviewed. Constitutional: She appears well-developed.  HENT:  Head: Normocephalic and atraumatic.  Eyes: Conjunctivae are normal.  Neck: Normal range of motion. Neck supple.  Cardiovascular: Normal rate.   Pulmonary/Chest: Effort normal and breath sounds normal. No stridor. No respiratory distress. She has no wheezes.    ED Course  Procedures (including critical care time) Labs Review Labs Reviewed  POCT PREGNANCY, URINE   Imaging Review No results found.  EKG Interpretation   None       MDM   1. Allergic reaction, initial encounter    Pt comes in with generalized itching and some throat tightness. No signs on exam of any airway compromise/narrowing. She has no stridor, no wheezing and no swelling of the visible oral mucosa. Her skin has no rash, but she has  itching.  Asked to d.c pyridium, and keep a close eye on her exposures and return of sx.   Derwood Kaplan, MD 06/23/13 [phone]

## 2013-06-26 ENCOUNTER — Emergency Department (HOSPITAL_COMMUNITY)
Admission: EM | Admit: 2013-06-26 | Discharge: 2013-06-26 | Disposition: A | Discharge status: Home / Self Care | Payer: Medicaid Other | Attending: Emergency Medicine | Admitting: Emergency Medicine

## 2013-06-26 ENCOUNTER — Telehealth: Payer: Self-pay | Admitting: Internal Medicine

## 2013-06-26 ENCOUNTER — Encounter (HOSPITAL_COMMUNITY): Payer: Self-pay | Admitting: Emergency Medicine

## 2013-06-26 DIAGNOSIS — Z8719 Personal history of other diseases of the digestive system: Secondary | ICD-10-CM | POA: Insufficient documentation

## 2013-06-26 DIAGNOSIS — Z9089 Acquired absence of other organs: Secondary | ICD-10-CM | POA: Insufficient documentation

## 2013-06-26 DIAGNOSIS — G8929 Other chronic pain: Secondary | ICD-10-CM | POA: Insufficient documentation

## 2013-06-26 DIAGNOSIS — IMO0002 Reserved for concepts with insufficient information to code with codable children: Secondary | ICD-10-CM | POA: Insufficient documentation

## 2013-06-26 DIAGNOSIS — Z79899 Other long term (current) drug therapy: Secondary | ICD-10-CM | POA: Insufficient documentation

## 2013-06-26 DIAGNOSIS — F17200 Nicotine dependence, unspecified, uncomplicated: Secondary | ICD-10-CM | POA: Insufficient documentation

## 2013-06-26 DIAGNOSIS — N390 Urinary tract infection, site not specified: Secondary | ICD-10-CM | POA: Insufficient documentation

## 2013-06-26 DIAGNOSIS — Z862 Personal history of diseases of the blood and blood-forming organs and certain disorders involving the immune mechanism: Secondary | ICD-10-CM | POA: Insufficient documentation

## 2013-06-26 LAB — URINALYSIS, ROUTINE W REFLEX MICROSCOPIC
Bilirubin Urine: NEGATIVE
Glucose, UA: NEGATIVE mg/dL
Ketones, ur: NEGATIVE mg/dL
Nitrite: NEGATIVE
pH: 6.5 (ref 5.0–8.0)

## 2013-06-26 LAB — URINE MICROSCOPIC-ADD ON

## 2013-06-26 MED ORDER — CEFTRIAXONE SODIUM 1 G IJ SOLR
1.0000 g | Freq: Once | INTRAMUSCULAR | Status: AC
  Administered 2013-06-26: 1 g via INTRAMUSCULAR
  Filled 2013-06-26: qty 10

## 2013-06-26 MED ORDER — SULFAMETHOXAZOLE-TRIMETHOPRIM 800-160 MG PO TABS: 1.0000 | ORAL_TABLET | Freq: Two times a day (BID) | ORAL | Status: DC

## 2013-06-26 NOTE — Telephone Encounter (Signed)
Pt is calling in today to see if she can get a script for Tramadol; Pt says that she was supposed to be scripted this medication upon her last visit but was not; I informed pt that she may have to come i for a visit to receive this medication; please advise pt on what to do next @ [phone]

## 2013-06-26 NOTE — ED Provider Notes (Signed)
Medical screening examination/treatment/procedure(s) were performed by non-physician practitioner and as supervising physician I was immediately available for consultation/collaboration.   Celene Kras, MD 06/26/13 [phone]

## 2013-06-26 NOTE — ED Notes (Signed)
Pt c/o flank pain and bilateral lower abd pain along with burning sensation with urination. Pt in nad, skin warm and dry, resp e/u. Pt requesting pain meds.

## 2013-06-26 NOTE — ED Notes (Signed)
Pt c/o UTI sx and is taking antibiotics given here

## 2013-06-26 NOTE — ED Notes (Signed)
Pt still requesting pain medicine. PA told pt she was not prescribing her any meds and the meds that should would normally prescribe the pt has an allergy to so she is unable to give her anything.

## 2013-06-26 NOTE — ED Notes (Signed)
Pt given heat packs and blankets in waiting room. Informed of delays.

## 2013-06-26 NOTE — ED Provider Notes (Signed)
CSN: [id number]     Arrival date & time 06/26/13  1126 History   First MD Initiated Contact with Patient 06/26/13 1521     Chief Complaint  Patient presents with  . Urinary Tract Infection   (Consider location/radiation/quality/duration/timing/severity/associated sxs/prior Treatment) The history is provided by the patient and medical records. No language interpreter was used.    [name] is a 32 y.o. female  with a hx of chronic abdominal pain presents to the Emergency Department complaining of gradual onset, persistent dysuria and low back pain onset one week ago. Associated symptoms include urgency and frequency along with mild suprapubic pain different from her chronic pancreatitis.  Nothing makes it better and nothing makes it worse.  Pt denies fever, chills, headache or neck pain, chest pain or shortness of breath, vomiting, diarrhea, weakness, dizziness, syncope.  Patient reports she was seen here approximately one week ago and given an antibiotic as well as pyridium. She reports this gave her an allergic reaction as she completed her antibiotic.  She reports her symptoms never worsened or improved.   Past Medical History  Diagnosis Date  . Pancreatitis   . S/P laparoscopic cholecystectomy   . Fibroids   . Ovarian cyst   . Chronic abdominal pain   . Anemia    Past Surgical History  Procedure Laterality Date  . Cholecystectomy    . Dilation and curettage of uterus    . Ercp w/ metal stent placement    . Oophorectomy     Family History  Problem Relation Age of Onset  . Diabetes Mother   . Hypertension Mother   . Hypertension Father   . Diabetes Father   . Asthma Daughter    History  Substance Use Topics  . Smoking status: Current Every Day Smoker -- 0.25 packs/day for 10 years    Types: Cigarettes    Last Attempt to Quit: 09/01/2012  . Smokeless tobacco: Never Used  . Alcohol Use: No   OB History   Grav Para Term Preterm Abortions TAB SAB Ect Mult Living    4 2 1 1 2  2         Review of Systems  Constitutional: Negative for fever, diaphoresis, appetite change, fatigue and unexpected weight change.  HENT: Negative for mouth sores.   Eyes: Negative for visual disturbance.  Respiratory: Negative for cough, chest tightness, shortness of breath and wheezing.   Cardiovascular: Negative for chest pain.  Gastrointestinal: Negative for nausea, vomiting, abdominal pain, diarrhea and constipation.  Endocrine: Negative for polydipsia, polyphagia and polyuria.  Genitourinary: Positive for dysuria, urgency and frequency. Negative for hematuria, decreased urine volume, vaginal bleeding, vaginal discharge, difficulty urinating, vaginal pain and dyspareunia.  Musculoskeletal: Positive for back pain. Negative for neck stiffness.  Skin: Negative for rash.  Allergic/Immunologic: Negative for immunocompromised state.  Neurological: Negative for syncope, light-headedness and headaches.  Hematological: Does not bruise/bleed easily.  Psychiatric/Behavioral: Negative for sleep disturbance. The patient is not nervous/anxious.     Allergies  Celecoxib; Coconut flavor; Fentanyl; Ibuprofen; Ketorolac tromethamine; Meperidine hcl; Morphine; Ondansetron; Other; Shellfish allergy; Tramadol; Propoxycaine; Robitussin (alcohol free); Ketamine; Propoxyphene n-acetaminophen; and Tylenol  Home Medications   Current Outpatient Rx  Name  Route  Sig  Dispense  Refill  . estradiol (ESTRACE) 2 MG tablet   Oral   Take 2 mg by mouth at bedtime.         . nitrofurantoin, macrocrystal-monohydrate, (MACROBID) 100 MG capsule   Oral   Take 100 mg by  mouth 2 (two) times daily.         . predniSONE (DELTASONE) 50 MG tablet   Oral   Take 1 tablet (50 mg total) by mouth daily.   5 tablet   0   . sulfamethoxazole-trimethoprim (SEPTRA DS) 800-160 MG per tablet   Oral   Take 1 tablet by mouth every 12 (twelve) hours.   20 tablet   0    BP 115/83  Pulse 82  Temp(Src) 98.7  F (37.1 C) (Oral)  Resp 16  Wt 163 lb 14.4 oz (74.345 kg)  SpO2 98%  LMP 04/30/2013 Physical Exam  Nursing note and vitals reviewed. Constitutional: She appears well-developed and well-nourished. No distress.  HENT:  Head: Normocephalic and atraumatic.  Mouth/Throat: Oropharynx is clear and moist. No oropharyngeal exudate.  Eyes: Conjunctivae are normal.  Neck: Normal range of motion. Neck supple.  Full ROM without pain  Cardiovascular: Normal rate, regular rhythm, normal heart sounds and intact distal pulses.   No murmur heard. No tachycardia  Pulmonary/Chest: Effort normal and breath sounds normal. No respiratory distress. She has no wheezes. She has no rales.  Clear and equal breath sounds  Abdominal: Soft. Normal appearance and bowel sounds are normal. She exhibits no distension and no mass. There is tenderness (mild suprapubic) in the suprapubic area. There is no rebound, no guarding and no CVA tenderness (no CVA tenderness ).  Mild suprapubic tenderness No CVA tenderness  Musculoskeletal: Normal range of motion. She exhibits no edema.  Full range of motion of the T-spine and L-spine No tenderness to palpation of the spinous processes of the T-spine or L-spine Mild tenderness to palpation of the bilateral paraspinous muscles of the L-spine  Lymphadenopathy:    She has no cervical adenopathy.  Neurological: She is alert. She has normal reflexes. GCS eye subscore is 4. GCS verbal subscore is 5. GCS motor subscore is 6.  Speech is clear and goal oriented, follows commands Normal strength in upper and lower extremities bilaterally including dorsiflexion and plantar flexion, strong and equal grip strength Sensation normal to light and sharp touch Moves extremities without ataxia, coordination intact Normal gait Normal balance   Skin: Skin is warm and dry. No rash noted. She is not diaphoretic. No erythema.  Psychiatric: She has a normal mood and affect.    ED Course   Procedures (including critical care time) Labs Review Labs Reviewed  URINALYSIS, ROUTINE W REFLEX MICROSCOPIC - Abnormal; Notable for the following:    APPearance CLOUDY (*)    Hgb urine dipstick MODERATE (*)    Leukocytes, UA LARGE (*)    All other components within normal limits  URINE MICROSCOPIC-ADD ON - Abnormal; Notable for the following:    Bacteria, UA FEW (*)    All other components within normal limits  URINE CULTURE   Imaging Review No results found.  EKG Interpretation   None       MDM   1. UTI (lower urinary tract infection)      Benjaman Kindler presents with persistent UTI symptoms including dysuria and low back pain. On exam patient is afebrile and not tachycardic. She has no CVA tenderness bilaterally. Her blood pressure is stable.  Pt has been diagnosed with a UTI P. UA.  UA with white blood cells too numerous to count and a few bacteria. Patient adamantly denies vaginal discharge, dyspareunia or vaginal irritation. Pt is afebrile, no CVA tenderness, normotensive, and denies N/V.   Record review shows the patient was  seen on 06/19/2013 and diagnosed with UTI. At that time she was given Macrobid. I reviewed the culture results from that day which shows Escherichia coli as the source. It is susceptible to Bactrim. We'll discharge home with Bactrim prescription. Patient reports she wants an IM injection of antibiotic here. Will give Rocephin 1 g IM. Pt to be dc home with antibiotics and instructions to follow up with PCP if symptoms persist.  It has been determined that no acute conditions requiring further emergency intervention are present at this time. The patient/guardian have been advised of the diagnosis and plan. We have discussed signs and symptoms that warrant return to the ED, such as changes or worsening in symptoms.   Vital signs are stable at discharge.   BP 115/83  Pulse 82  Temp(Src) 98.7 F (37.1 C) (Oral)  Resp 16  Wt 163 lb 14.4 oz (74.345  kg)  SpO2 98%  LMP 04/30/2013  Patient/guardian has voiced understanding and agreed to follow-up with the PCP or specialist.         Dierdre Forth, PA-C 06/26/13 1559

## 2013-06-27 LAB — URINE CULTURE: Colony Count: NO GROWTH

## 2013-06-28 ENCOUNTER — Emergency Department (HOSPITAL_COMMUNITY)
Admission: EM | Admit: 2013-06-28 | Discharge: 2013-06-28 | Disposition: A | Discharge status: Home / Self Care | Payer: Medicaid Other | Attending: Emergency Medicine | Admitting: Emergency Medicine

## 2013-06-28 ENCOUNTER — Encounter (HOSPITAL_COMMUNITY): Payer: Self-pay | Admitting: Emergency Medicine

## 2013-06-28 DIAGNOSIS — Z8719 Personal history of other diseases of the digestive system: Secondary | ICD-10-CM | POA: Insufficient documentation

## 2013-06-28 DIAGNOSIS — Z862 Personal history of diseases of the blood and blood-forming organs and certain disorders involving the immune mechanism: Secondary | ICD-10-CM | POA: Insufficient documentation

## 2013-06-28 DIAGNOSIS — F17200 Nicotine dependence, unspecified, uncomplicated: Secondary | ICD-10-CM | POA: Insufficient documentation

## 2013-06-28 DIAGNOSIS — R0789 Other chest pain: Secondary | ICD-10-CM

## 2013-06-28 DIAGNOSIS — Z79899 Other long term (current) drug therapy: Secondary | ICD-10-CM | POA: Insufficient documentation

## 2013-06-28 DIAGNOSIS — R071 Chest pain on breathing: Secondary | ICD-10-CM | POA: Insufficient documentation

## 2013-06-28 DIAGNOSIS — G8929 Other chronic pain: Secondary | ICD-10-CM | POA: Insufficient documentation

## 2013-06-28 DIAGNOSIS — Z8742 Personal history of other diseases of the female genital tract: Secondary | ICD-10-CM | POA: Insufficient documentation

## 2013-06-28 NOTE — ED Notes (Signed)
Epigastric pain or ? Chest pain. When pancreas flares up starts having cp not as bad.

## 2013-06-28 NOTE — ED Provider Notes (Signed)
CSN: [id number]     Arrival date & time 06/28/13  2101 History  This chart was scribed for non-physician practitioner Danne Harbor working with Celene Kras, MD by Joaquin Music, ED Scribe. This patient was seen in room TR08C/TR08C and the patient's care was started at 11:06 PM .   Chief Complaint  Patient presents with  . Abdominal Pain  . Chest Pain   Patient is a 32 y.o. female presenting with chest pain. The history is provided by the patient. No language interpreter was used.  Chest Pain Associated symptoms: no cough and no fever    HPI Comments: [name] is a 33 y.o. female who presents to the Emergency Department complaining of ongoing centered CP that began 3 days ago. Pt states the pain is constant. Pt states she has informed her PCP and states she has been having pain with her pancreas, which her PCP is aware of. Pt denies fever and cough.   Past Medical History  Diagnosis Date  . Pancreatitis   . S/P laparoscopic cholecystectomy   . Fibroids   . Ovarian cyst   . Chronic abdominal pain   . Anemia    Past Surgical History  Procedure Laterality Date  . Cholecystectomy    . Dilation and curettage of uterus    . Ercp w/ metal stent placement    . Oophorectomy     Family History  Problem Relation Age of Onset  . Diabetes Mother   . Hypertension Mother   . Hypertension Father   . Diabetes Father   . Asthma Daughter    History  Substance Use Topics  . Smoking status: Current Every Day Smoker -- 0.25 packs/day for 10 years    Types: Cigarettes    Last Attempt to Quit: 09/01/2012  . Smokeless tobacco: Never Used  . Alcohol Use: No   OB History   Grav Para Term Preterm Abortions TAB SAB Ect Mult Living   4 2 1 1 2  2         Review of Systems  Constitutional: Negative for fever.  Respiratory: Negative for cough.   Cardiovascular: Positive for chest pain.  All other systems reviewed and are negative.   Allergies  Celecoxib; Coconut  flavor; Fentanyl; Ibuprofen; Ketorolac tromethamine; Meperidine hcl; Morphine; Ondansetron; Other; Shellfish allergy; Tramadol; Propoxycaine; Robitussin (alcohol free); Ketamine; Propoxyphene n-acetaminophen; and Tylenol  Home Medications   Current Outpatient Rx  Name  Route  Sig  Dispense  Refill  . estradiol (ESTRACE) 2 MG tablet   Oral   Take 2 mg by mouth at bedtime.         . sulfamethoxazole-trimethoprim (SEPTRA DS) 800-160 MG per tablet   Oral   Take 1 tablet by mouth every 12 (twelve) hours.   20 tablet   0    BP 117/77  Pulse 68  Temp(Src) 98.7 F (37.1 C) (Oral)  Resp 16  Wt 165 lb 8 oz (75.07 kg)  SpO2 96%  LMP 04/30/2013  Physical Exam  Nursing note and vitals reviewed. Constitutional: She is oriented to person, place, and time. She appears well-developed and well-nourished. No distress.  HENT:  Head: Normocephalic and atraumatic.  Eyes: EOM are normal.  Neck: Neck supple. No tracheal deviation present.  Cardiovascular: Normal rate, regular rhythm and normal heart sounds.   Pulmonary/Chest: Effort normal. No respiratory distress.  Lungs clear. Full breath sounds all fields. There is mild reproducible chest tenderness in lower central chest consistent with chest wall pain.  Abdominal: Soft. There is no tenderness.  Musculoskeletal: Normal range of motion.  Neurological: She is alert and oriented to person, place, and time.  Skin: Skin is warm and dry.  Psychiatric: She has a normal mood and affect. Her behavior is normal.    ED Course  Procedures DIAGNOSTIC STUDIES: Oxygen Saturation is 100% on RA, normal by my interpretation.    COORDINATION OF CARE: 10:21 PM-Discussed treatment plan which includes D/C pt . Pt chart reviewed including established Care Plan. Pt agreed to plan.   Labs Review Labs Reviewed - No data to display Imaging Review No results found.  EKG Interpretation    Date/Time:  Thursday June 28 2013 22:07:42 EST Ventricular  Rate:  66 PR Interval:  158 QRS Duration: 76 QT Interval:  380 QTC Calculation: 398 R Axis:   40 Text Interpretation:  Normal sinus rhythm Normal ECG No significant change since last tracing Confirmed by KNAPP  MD-J, JON ([id number]) on 06/28/2013 10:11:09 PM           MDM   1. Chest wall pain    Normal EKG, symptoms atypical of cardiac or pulmonary chest pain with reproducible tenderness c/w chest wall pain. Stable for discharge.  I personally performed the services described in this documentation, which was scribed in my presence. The recorded information has been reviewed and is accurate.     Arnoldo Hooker, PA-C 06/28/13 2308

## 2013-07-01 ENCOUNTER — Encounter (HOSPITAL_COMMUNITY): Payer: Self-pay | Admitting: Emergency Medicine

## 2013-07-01 ENCOUNTER — Emergency Department (HOSPITAL_COMMUNITY)
Admission: EM | Admit: 2013-07-01 | Discharge: 2013-07-02 | Disposition: A | Discharge status: Home / Self Care | Payer: Medicaid Other | Attending: Emergency Medicine | Admitting: Emergency Medicine

## 2013-07-01 DIAGNOSIS — F17200 Nicotine dependence, unspecified, uncomplicated: Secondary | ICD-10-CM | POA: Insufficient documentation

## 2013-07-01 DIAGNOSIS — R112 Nausea with vomiting, unspecified: Secondary | ICD-10-CM | POA: Insufficient documentation

## 2013-07-01 DIAGNOSIS — D649 Anemia, unspecified: Secondary | ICD-10-CM | POA: Insufficient documentation

## 2013-07-01 DIAGNOSIS — Z888 Allergy status to other drugs, medicaments and biological substances status: Secondary | ICD-10-CM | POA: Insufficient documentation

## 2013-07-01 DIAGNOSIS — G8929 Other chronic pain: Secondary | ICD-10-CM | POA: Insufficient documentation

## 2013-07-01 DIAGNOSIS — K861 Other chronic pancreatitis: Secondary | ICD-10-CM | POA: Insufficient documentation

## 2013-07-01 DIAGNOSIS — Z79899 Other long term (current) drug therapy: Secondary | ICD-10-CM | POA: Insufficient documentation

## 2013-07-01 DIAGNOSIS — R197 Diarrhea, unspecified: Secondary | ICD-10-CM | POA: Insufficient documentation

## 2013-07-01 DIAGNOSIS — Z8742 Personal history of other diseases of the female genital tract: Secondary | ICD-10-CM | POA: Insufficient documentation

## 2013-07-01 DIAGNOSIS — Z885 Allergy status to narcotic agent status: Secondary | ICD-10-CM | POA: Insufficient documentation

## 2013-07-01 DIAGNOSIS — Z9889 Other specified postprocedural states: Secondary | ICD-10-CM | POA: Insufficient documentation

## 2013-07-01 NOTE — ED Notes (Signed)
Pt states abdominal pain, swelling to her upper left quadrant, N/V/D. Pt states pain worse today.

## 2013-07-02 LAB — URINALYSIS, ROUTINE W REFLEX MICROSCOPIC
Bilirubin Urine: NEGATIVE
Glucose, UA: NEGATIVE mg/dL
Nitrite: NEGATIVE
Specific Gravity, Urine: 1.026 (ref 1.005–1.030)
Urobilinogen, UA: 1 mg/dL (ref 0.0–1.0)

## 2013-07-02 LAB — CBC WITH DIFFERENTIAL/PLATELET
Basophils Absolute: 0 10*3/uL (ref 0.0–0.1)
Basophils Relative: 0 % (ref 0–1)
Eosinophils Absolute: 0.4 10*3/uL (ref 0.0–0.7)
Eosinophils Relative: 6 % — ABNORMAL HIGH (ref 0–5)
Hemoglobin: 12.7 g/dL (ref 12.0–15.0)
Lymphs Abs: 3.2 10*3/uL (ref 0.7–4.0)
MCH: 28.7 pg (ref 26.0–34.0)
MCHC: 35.7 g/dL (ref 30.0–36.0)
MCV: 80.4 fL (ref 78.0–100.0)
Monocytes Absolute: 0.5 10*3/uL (ref 0.1–1.0)
Monocytes Relative: 7 % (ref 3–12)
Platelets: 237 10*3/uL (ref 150–400)
RBC: 4.43 MIL/uL (ref 3.87–5.11)

## 2013-07-02 LAB — COMPREHENSIVE METABOLIC PANEL
AST: 60 U/L — ABNORMAL HIGH (ref 0–37)
Albumin: 3.9 g/dL (ref 3.5–5.2)
Alkaline Phosphatase: 63 U/L (ref 39–117)
BUN: 17 mg/dL (ref 6–23)
Calcium: 8.8 mg/dL (ref 8.4–10.5)
Chloride: 104 mEq/L (ref 96–112)
Creatinine, Ser: 0.65 mg/dL (ref 0.50–1.10)
GFR calc Af Amer: >90 mL/min (ref >90)
Glucose, Bld: 89 mg/dL (ref 70–99)
Total Bilirubin: 0.2 mg/dL — ABNORMAL LOW (ref 0.3–1.2)
Total Protein: 7.5 g/dL (ref 6.0–8.3)

## 2013-07-02 LAB — LIPASE, BLOOD: Lipase: 132 U/L — ABNORMAL HIGH (ref 11–59)

## 2013-07-02 LAB — URINE MICROSCOPIC-ADD ON

## 2013-07-02 MED ORDER — PANTOPRAZOLE SODIUM 40 MG IV SOLR
40.0000 mg | Freq: Once | INTRAVENOUS | Status: DC
  Filled 2013-07-02: qty 40

## 2013-07-02 MED ORDER — DIPHENHYDRAMINE HCL 50 MG/ML IJ SOLN
25.0000 mg | Freq: Once | INTRAMUSCULAR | Status: AC
  Administered 2013-07-02: 25 mg via INTRAVENOUS
  Filled 2013-07-02: qty 1

## 2013-07-02 MED ORDER — HYDROMORPHONE HCL PF 1 MG/ML IJ SOLN
1.0000 mg | Freq: Once | INTRAMUSCULAR | Status: AC
  Administered 2013-07-02: 1 mg via INTRAVENOUS
  Filled 2013-07-02: qty 1

## 2013-07-02 MED ORDER — SODIUM CHLORIDE 0.9 % IV SOLN
1000.0000 mL | Freq: Once | INTRAVENOUS | Status: AC
  Administered 2013-07-02: 1000 mL via INTRAVENOUS

## 2013-07-02 MED ORDER — METOCLOPRAMIDE HCL 10 MG PO TABS: 10.0000 mg | ORAL_TABLET | Freq: Four times a day (QID) | ORAL | Status: DC | PRN

## 2013-07-02 MED ORDER — METOCLOPRAMIDE HCL 5 MG/ML IJ SOLN
10.0000 mg | Freq: Once | INTRAMUSCULAR | Status: AC
  Administered 2013-07-02: 10 mg via INTRAVENOUS
  Filled 2013-07-02: qty 2

## 2013-07-02 MED ORDER — SODIUM CHLORIDE 0.9 % IV SOLN: 1000.0000 mL | INTRAVENOUS | Status: DC

## 2013-07-02 NOTE — ED Provider Notes (Signed)
CSN: [id number]     Arrival date & time 07/01/13  2146 History   First MD Initiated Contact with Patient 07/02/13 0050     Chief Complaint  Patient presents with  . Abdominal Pain   (Consider location/radiation/quality/duration/timing/severity/associated sxs/prior Treatment) Patient is a 32 y.o. female presenting with abdominal pain. The history is provided by the patient.  Abdominal Pain She has a history of chronic abdominal pain and pancreatitis and as noted onset yesterday of pain and swelling across her upper abdomen. She states the pain is sharp and well and at a keep and cramping she rates it at 10/10. Nothing makes it better nothing makes it worse. She developed nausea, vomiting, diarrhea today. She states she's not been able to hold anything in her stomach. She has not taken any medication. Of note, she had placement of a stent in her pancreatic duct and it did not help her chronic pain and was subsequently removed. She is asking if there is some surgery that can be done. She denies ethanol use and denies fever, chills, sweats.  Past Medical History  Diagnosis Date  . Pancreatitis   . S/P laparoscopic cholecystectomy   . Fibroids   . Ovarian cyst   . Chronic abdominal pain   . Anemia    Past Surgical History  Procedure Laterality Date  . Cholecystectomy    . Dilation and curettage of uterus    . Ercp w/ metal stent placement    . Oophorectomy     Family History  Problem Relation Age of Onset  . Diabetes Mother   . Hypertension Mother   . Hypertension Father   . Diabetes Father   . Asthma Daughter    History  Substance Use Topics  . Smoking status: Current Every Day Smoker -- 0.25 packs/day for 10 years    Types: Cigarettes    Last Attempt to Quit: 09/01/2012  . Smokeless tobacco: Never Used  . Alcohol Use: No   OB History   Grav Para Term Preterm Abortions TAB SAB Ect Mult Living   4 2 1 1 2  2         Review of Systems  Gastrointestinal: Positive for  abdominal pain.  All other systems reviewed and are negative.    Allergies  Celecoxib; Coconut flavor; Fentanyl; Ibuprofen; Ketorolac tromethamine; Meperidine hcl; Morphine; Ondansetron; Other; Shellfish allergy; Tramadol; Propoxycaine; Robitussin (alcohol free); Ketamine; Propoxyphene n-acetaminophen; and Tylenol  Home Medications   Current Outpatient Rx  Name  Route  Sig  Dispense  Refill  . estradiol (ESTRACE) 2 MG tablet   Oral   Take 2 mg by mouth at bedtime.         . sulfamethoxazole-trimethoprim (SEPTRA DS) 800-160 MG per tablet   Oral   Take 1 tablet by mouth every 12 (twelve) hours.   20 tablet   0    BP 126/89  Pulse 72  Temp(Src) 98.2 F (36.8 C) (Oral)  Resp 15  SpO2 99%  LMP 04/30/2013 Physical Exam  Nursing note and vitals reviewed.  32 year old female, resting comfortably and in no acute distress. This is in spite of pain reported at 10/10. Vital signs are normal. Oxygen saturation is 99%, which is normal. Head is normocephalic and atraumatic. PERRLA, EOMI. Oropharynx is clear. Neck is nontender and supple without adenopathy or JVD. Back is nontender and there is no CVA tenderness. Lungs are clear without rales, wheezes, or rhonchi. Chest is nontender. Heart has regular rate and rhythm without murmur.  Abdomen is soft, flat, with mild to moderate tenderness in the epigastrium and left upper quadrant. There is no rebound or guarding. There are no masses or hepatosplenomegaly and peristalsis is hypoactive. Extremities have no cyanosis or edema, full range of motion is present. Skin is warm and dry without rash. Neurologic: Mental status is normal, cranial nerves are intact, there are no motor or sensory deficits.  ED Course  Procedures (including critical care time) Labs Review Results for orders placed during the hospital encounter of 07/01/13  CBC WITH DIFFERENTIAL      Result Value Range   WBC 6.5  4.0 - 10.5 K/uL   RBC 4.43  3.87 - 5.11 MIL/uL    Hemoglobin 12.7  12.0 - 15.0 g/dL   HCT 65.7 (*) 84.6 - 96.2 %   MCV 80.4  78.0 - 100.0 fL   MCH 28.7  26.0 - 34.0 pg   MCHC 35.7  30.0 - 36.0 g/dL   RDW 95.2  84.1 - 32.4 %   Platelets 237  150 - 400 K/uL   Neutrophils Relative % 37 (*) 43 - 77 %   Neutro Abs 2.4  1.7 - 7.7 K/uL   Lymphocytes Relative 50 (*) 12 - 46 %   Lymphs Abs 3.2  0.7 - 4.0 K/uL   Monocytes Relative 7  3 - 12 %   Monocytes Absolute 0.5  0.1 - 1.0 K/uL   Eosinophils Relative 6 (*) 0 - 5 %   Eosinophils Absolute 0.4  0.0 - 0.7 K/uL   Basophils Relative 0  0 - 1 %   Basophils Absolute 0.0  0.0 - 0.1 K/uL  COMPREHENSIVE METABOLIC PANEL      Result Value Range   Sodium 137  135 - 145 mEq/L   Potassium 3.4 (*) 3.5 - 5.1 mEq/L   Chloride 104  96 - 112 mEq/L   CO2 23  19 - 32 mEq/L   Glucose, Bld 89  70 - 99 mg/dL   BUN 17  6 - 23 mg/dL   Creatinine, Ser 4.01  0.50 - 1.10 mg/dL   Calcium 8.8  8.4 - 02.7 mg/dL   Total Protein 7.5  6.0 - 8.3 g/dL   Albumin 3.9  3.5 - 5.2 g/dL   AST 60 (*) 0 - 37 U/L   ALT 59 (*) 0 - 35 U/L   Alkaline Phosphatase 63  39 - 117 U/L   Total Bilirubin 0.2 (*) 0.3 - 1.2 mg/dL   GFR calc non Af Amer >90  >90 mL/min   GFR calc Af Amer >90  >90 mL/min  LIPASE, BLOOD      Result Value Range   Lipase 132 (*) 11 - 59 U/L  URINALYSIS, ROUTINE W REFLEX MICROSCOPIC      Result Value Range   Color, Urine YELLOW  YELLOW   APPearance CLOUDY (*) CLEAR   Specific Gravity, Urine 1.026  1.005 - 1.030   pH 6.0  5.0 - 8.0   Glucose, UA NEGATIVE  NEGATIVE mg/dL   Hgb urine dipstick TRACE (*) NEGATIVE   Bilirubin Urine NEGATIVE  NEGATIVE   Ketones, ur NEGATIVE  NEGATIVE mg/dL   Protein, ur NEGATIVE  NEGATIVE mg/dL   Urobilinogen, UA 1.0  0.0 - 1.0 mg/dL   Nitrite NEGATIVE  NEGATIVE   Leukocytes, UA TRACE (*) NEGATIVE  URINE MICROSCOPIC-ADD ON      Result Value Range   Squamous Epithelial / LPF RARE  RARE   WBC, UA 0-2  <  3 WBC/hpf   RBC / HPF 0-2  <3 RBC/hpf   Urine-Other RARE YEAST      MDM   1. Chronic pancreatitis   2. Nausea vomiting and diarrhea    Abdominal pain with vomiting and diarrhea of uncertain cause. Old records are reviewed and she has numerous ED visits for chronic abdominal pain. She did have placement of a pancreatic duct stent which was subsequently removal as noted above. She also had a recent ED visits for UTI and chest pain. She has multiple allergies. She'll be given IV fluids and dose of IV metoclopramide for nausea and IV pantoprazole. Care plan was reviewed. Since patient does not appear in any distress and has had drug-seeking behavior in the past, narcotics will not be given unless a clear reason is found based on the evaluation in the ED.  Workup is significant for elevated lipase. It is not as high as it was when she was seen in the ED and discharged 2 months ago. He is given a single dose of hydromorphone and is discharged with a prescription for metoclopramide for nausea.  Dione Booze, MD 07/02/13 [phone]

## 2013-07-02 NOTE — ED Notes (Signed)
RN contacted IV team. IV team returned page.

## 2013-07-02 NOTE — ED Provider Notes (Signed)
Medical screening examination/treatment/procedure(s) were performed by non-physician practitioner and as supervising physician I was immediately available for consultation/collaboration.  EKG Interpretation    Date/Time:  Thursday June 28 2013 22:07:42 EST Ventricular Rate:  66 PR Interval:  158 QRS Duration: 76 QT Interval:  380 QTC Calculation: 398 R Axis:   40 Text Interpretation:  Normal sinus rhythm Normal ECG No significant change since last tracing Confirmed by Brenae Lasecki  MD-J, Nai Borromeo ([id number]) on 06/28/2013 10:11:09 PM              Celene Kras, MD 07/02/13 [phone]

## 2013-07-04 ENCOUNTER — Emergency Department (HOSPITAL_COMMUNITY)
Admission: EM | Admit: 2013-07-04 | Discharge: 2013-07-04 | Disposition: A | Discharge status: Home / Self Care | Payer: Medicaid Other | Attending: Emergency Medicine | Admitting: Emergency Medicine

## 2013-07-04 ENCOUNTER — Encounter (HOSPITAL_COMMUNITY): Payer: Self-pay | Admitting: Emergency Medicine

## 2013-07-04 DIAGNOSIS — R1013 Epigastric pain: Secondary | ICD-10-CM | POA: Insufficient documentation

## 2013-07-04 DIAGNOSIS — F17200 Nicotine dependence, unspecified, uncomplicated: Secondary | ICD-10-CM | POA: Insufficient documentation

## 2013-07-04 DIAGNOSIS — G8929 Other chronic pain: Secondary | ICD-10-CM | POA: Insufficient documentation

## 2013-07-04 DIAGNOSIS — Z792 Long term (current) use of antibiotics: Secondary | ICD-10-CM | POA: Insufficient documentation

## 2013-07-04 DIAGNOSIS — R1110 Vomiting, unspecified: Secondary | ICD-10-CM | POA: Insufficient documentation

## 2013-07-04 DIAGNOSIS — Z9089 Acquired absence of other organs: Secondary | ICD-10-CM | POA: Insufficient documentation

## 2013-07-04 DIAGNOSIS — Z862 Personal history of diseases of the blood and blood-forming organs and certain disorders involving the immune mechanism: Secondary | ICD-10-CM | POA: Insufficient documentation

## 2013-07-04 DIAGNOSIS — R197 Diarrhea, unspecified: Secondary | ICD-10-CM | POA: Insufficient documentation

## 2013-07-04 DIAGNOSIS — Z8719 Personal history of other diseases of the digestive system: Secondary | ICD-10-CM | POA: Insufficient documentation

## 2013-07-04 DIAGNOSIS — Z8742 Personal history of other diseases of the female genital tract: Secondary | ICD-10-CM | POA: Insufficient documentation

## 2013-07-04 NOTE — ED Notes (Signed)
Pt. reports chronic generalized abdominal pain with vomitting and diarrhea for several weeks .

## 2013-07-04 NOTE — ED Notes (Signed)
Pt reports she is having a flare up of her pancreatitis.  Was seen here two days ago for same, dc'd home with nausea medication but no pain medication.

## 2013-07-04 NOTE — ED Provider Notes (Signed)
CSN: [id number]     Arrival date & time 07/04/13  4098 History   First MD Initiated Contact with Patient 07/04/13 [phone]     Chief Complaint  Patient presents with  . Abdominal Pain    Patient is a 32 y.o. female presenting with abdominal pain. The history is provided by the patient.  Abdominal Pain Pain location:  Epigastric Pain radiates to:  Does not radiate Pain severity:  Moderate Onset quality:  Gradual Timing:  Constant Progression:  Unchanged Chronicity:  Chronic Relieved by:  Nothing Associated symptoms: diarrhea and vomiting   Associated symptoms: no fever     Past Medical History  Diagnosis Date  . Pancreatitis   . S/P laparoscopic cholecystectomy   . Fibroids   . Ovarian cyst   . Chronic abdominal pain   . Anemia    Past Surgical History  Procedure Laterality Date  . Cholecystectomy    . Dilation and curettage of uterus    . Ercp w/ metal stent placement    . Oophorectomy     Family History  Problem Relation Age of Onset  . Diabetes Mother   . Hypertension Mother   . Hypertension Father   . Diabetes Father   . Asthma Daughter    History  Substance Use Topics  . Smoking status: Current Every Day Smoker -- 0.25 packs/day for 10 years    Types: Cigarettes    Last Attempt to Quit: 09/01/2012  . Smokeless tobacco: Never Used  . Alcohol Use: No   OB History   Grav Para Term Preterm Abortions TAB SAB Ect Mult Living   4 2 1 1 2  2         Review of Systems  Constitutional: Negative for fever.  Gastrointestinal: Positive for vomiting, abdominal pain and diarrhea.    Allergies  Celecoxib; Coconut flavor; Fentanyl; Ibuprofen; Ketorolac tromethamine; Meperidine hcl; Morphine; Ondansetron; Other; Shellfish allergy; Tramadol; Propoxycaine; Protonix; Robitussin (alcohol free); Aspirin; Ketamine; Propoxyphene n-acetaminophen; and Tylenol  Home Medications   Current Outpatient Rx  Name  Route  Sig  Dispense  Refill  . estradiol (ESTRACE) 2 MG tablet  Oral   Take 2 mg by mouth at bedtime.         . metoCLOPramide (REGLAN) 10 MG tablet   Oral   Take 1 tablet (10 mg total) by mouth every 6 (six) hours as needed for nausea.   30 tablet   0   . nitrofurantoin, macrocrystal-monohydrate, (MACROBID) 100 MG capsule   Oral   Take 100 mg by mouth 2 (two) times daily.         Marland Kitchen sulfamethoxazole-trimethoprim (SEPTRA DS) 800-160 MG per tablet   Oral   Take 1 tablet by mouth every 12 (twelve) hours.   20 tablet   0    BP 119/74  Pulse 62  Temp(Src) 97.7 F (36.5 C) (Oral)  Resp 16  Ht 5\' 5"  (1.651 m)  Wt 163 lb (73.936 kg)  BMI 27.12 kg/m2  SpO2 100%  LMP 04/30/2013 Physical Exam CONSTITUTIONAL: Well developed/well nourished HEAD: Normocephalic/atraumatic EYES: EOMI/PERRL ENMT: Mucous membranes moist NECK: supple no meningeal signs CV: S1/S2 noted, no murmurs/rubs/gallops noted LUNGS: Lungs are clear to auscultation bilaterally, no apparent distress ABDOMEN: soft, nontender, no rebound or guarding NEURO: Pt is awake/alert, moves all extremitiesx4 EXTREMITIES: pulses normal, full ROM SKIN: warm, color normal PSYCH: no abnormalities of mood noted  ED Course  Procedures (including critical care time) Labs Review Labs Reviewed - No data to display  Imaging Review No results found.  EKG Interpretation   None       MDM   1. Chronic pain    Nursing notes including past medical history and social history reviewed and considered in documentation  Pt with h/o chronic pain, abdominal exam benign at this time, no distress noted, stable for d/c and i advised outpatient management of her pain     Joya Gaskins, MD 07/04/13 [phone]

## 2013-07-04 NOTE — ED Notes (Signed)
Pt removed BP cuff, refused to allow her pressure to be taken and refused to allow me to assess her heart rate, etc.  Attempted to review discharge instructions with patient but pt threw them in the trash and stated "I ain't signin anything, he didn't do nothing for me" and walked out of the room.

## 2013-07-27 ENCOUNTER — Encounter (HOSPITAL_COMMUNITY): Payer: Self-pay | Admitting: Emergency Medicine

## 2013-07-27 DIAGNOSIS — R112 Nausea with vomiting, unspecified: Secondary | ICD-10-CM | POA: Insufficient documentation

## 2013-07-27 DIAGNOSIS — Z765 Malingerer [conscious simulation]: Secondary | ICD-10-CM | POA: Insufficient documentation

## 2013-07-27 DIAGNOSIS — Z8742 Personal history of other diseases of the female genital tract: Secondary | ICD-10-CM | POA: Insufficient documentation

## 2013-07-27 DIAGNOSIS — Z792 Long term (current) use of antibiotics: Secondary | ICD-10-CM | POA: Insufficient documentation

## 2013-07-27 DIAGNOSIS — G8929 Other chronic pain: Secondary | ICD-10-CM | POA: Insufficient documentation

## 2013-07-27 DIAGNOSIS — F17200 Nicotine dependence, unspecified, uncomplicated: Secondary | ICD-10-CM | POA: Insufficient documentation

## 2013-07-27 DIAGNOSIS — R1013 Epigastric pain: Secondary | ICD-10-CM | POA: Insufficient documentation

## 2013-07-27 DIAGNOSIS — L299 Pruritus, unspecified: Secondary | ICD-10-CM | POA: Insufficient documentation

## 2013-07-27 DIAGNOSIS — Z8659 Personal history of other mental and behavioral disorders: Secondary | ICD-10-CM | POA: Insufficient documentation

## 2013-07-27 DIAGNOSIS — R197 Diarrhea, unspecified: Secondary | ICD-10-CM | POA: Insufficient documentation

## 2013-07-27 DIAGNOSIS — Z8719 Personal history of other diseases of the digestive system: Secondary | ICD-10-CM | POA: Insufficient documentation

## 2013-07-27 DIAGNOSIS — Z9089 Acquired absence of other organs: Secondary | ICD-10-CM | POA: Insufficient documentation

## 2013-07-27 NOTE — ED Notes (Signed)
The pt c/o abd pain for 2 1/2 weeks with n v and diarrhea.   She reports that she has a pancreatitis flare-up

## 2013-07-27 NOTE — ED Notes (Signed)
Pt advised of the wait

## 2013-07-28 ENCOUNTER — Emergency Department (HOSPITAL_COMMUNITY)
Admission: EM | Admit: 2013-07-28 | Discharge: 2013-07-28 | Disposition: A | Discharge status: Home / Self Care | Payer: Medicaid Other | Attending: Emergency Medicine | Admitting: Emergency Medicine

## 2013-07-28 DIAGNOSIS — Z765 Malingerer [conscious simulation]: Secondary | ICD-10-CM

## 2013-07-28 DIAGNOSIS — R1013 Epigastric pain: Secondary | ICD-10-CM

## 2013-07-28 DIAGNOSIS — R112 Nausea with vomiting, unspecified: Secondary | ICD-10-CM

## 2013-07-28 LAB — RAPID URINE DRUG SCREEN, HOSP PERFORMED
Amphetamines: NOT DETECTED
BARBITURATES: NOT DETECTED
BENZODIAZEPINES: NOT DETECTED
Cocaine: NOT DETECTED
Opiates: POSITIVE — AB
Tetrahydrocannabinol: NOT DETECTED

## 2013-07-28 LAB — URINALYSIS, ROUTINE W REFLEX MICROSCOPIC
Bilirubin Urine: NEGATIVE
Glucose, UA: NEGATIVE mg/dL
Hgb urine dipstick: NEGATIVE
KETONES UR: NEGATIVE mg/dL
Leukocytes, UA: NEGATIVE
NITRITE: NEGATIVE
PH: 5.5 (ref 5.0–8.0)
Protein, ur: NEGATIVE mg/dL
SPECIFIC GRAVITY, URINE: 1.031 — AB (ref 1.005–1.030)
Urobilinogen, UA: 1 mg/dL (ref 0.0–1.0)

## 2013-07-28 MED ORDER — PROMETHAZINE HCL 25 MG/ML IJ SOLN
25.0000 mg | Freq: Once | INTRAMUSCULAR | Status: DC
  Filled 2013-07-28: qty 1

## 2013-07-28 MED ORDER — PROMETHAZINE HCL 25 MG RE SUPP: 25.0000 mg | Freq: Four times a day (QID) | RECTAL | Status: DC | PRN

## 2013-07-28 MED ORDER — SODIUM CHLORIDE 0.9 % IV BOLUS (SEPSIS): 1000.0000 mL | Freq: Once | INTRAVENOUS | Status: DC

## 2013-07-28 MED ORDER — FAMOTIDINE IN NACL 20-0.9 MG/50ML-% IV SOLN: 20.0000 mg | Freq: Once | INTRAVENOUS | Status: DC

## 2013-07-28 MED ORDER — HYDROMORPHONE HCL PF 1 MG/ML IJ SOLN: 1.0000 mg | Freq: Once | INTRAMUSCULAR | Status: DC

## 2013-07-28 NOTE — ED Notes (Signed)
Patient refused to sign discharge instructions.  Patient left stating she doesn't know what is wrong with her.  Patient was explained that she refused to have blood drawn and that there wasn't anything else we could do if she refused all attempts to get blood.  Patient left room.

## 2013-07-28 NOTE — ED Notes (Signed)
Attempted to obtain venous labs using ultrasound for needle guidance.  Pt stated that the needle burned and pulled out needle.  Pt refused to allow staff to apply bandage to arm or clean ultrasound jelly from arm.

## 2013-07-28 NOTE — ED Notes (Signed)
Respiratory at bedside to attempt to draw blood.

## 2013-07-28 NOTE — ED Notes (Signed)
Pt states that she just wants to have the blood work done and does not want medication unless she is going to get pain meds. Dr. Cheri Guppy in room to speak to pt about care plan.

## 2013-07-28 NOTE — ED Notes (Signed)
Patient refused to sign e-pad and refused discharge papers.

## 2013-07-28 NOTE — ED Notes (Signed)
Patient refused to let respiratory attempt to draw blood. Charge and MD notified.
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
## 2013-07-28 NOTE — ED Notes (Signed)
phlebotomy at bedside.  

## 2013-07-28 NOTE — Discharge Instructions (Signed)
Abdominal Pain, Adult °Many things can cause abdominal pain. Usually, abdominal pain is not caused by a disease and will improve without treatment. It can often be observed and treated at home. Your health care provider will do a physical exam and possibly order blood tests and X-rays to help determine the seriousness of your pain. However, in many cases, more time must pass before a clear cause of the pain can be found. Before that point, your health care provider may not know if you need more testing or further treatment. °HOME CARE INSTRUCTIONS  °Monitor your abdominal pain for any changes. The following actions may help to alleviate any discomfort you are experiencing: °· Only take over-the-counter or prescription medicines as directed by your health care provider. °· Do not take laxatives unless directed to do so by your health care provider. °· Try a clear liquid diet (broth, tea, or water) as directed by your health care provider. Slowly move to a bland diet as tolerated. °SEEK MEDICAL CARE IF: °· You have unexplained abdominal pain. °· You have abdominal pain associated with nausea or diarrhea. °· You have pain when you urinate or have a bowel movement. °· You experience abdominal pain that wakes you in the night. °· You have abdominal pain that is worsened or improved by eating food. °· You have abdominal pain that is worsened with eating fatty foods. °SEEK IMMEDIATE MEDICAL CARE IF:  °· Your pain does not go away within 2 hours. °· You have a fever. °· You keep throwing up (vomiting). °· Your pain is felt only in portions of the abdomen, such as the right side or the left lower portion of the abdomen. °· You pass bloody or black tarry stools. °MAKE SURE YOU: °· Understand these instructions.   °· Will watch your condition.   °· Will get help right away if you are not doing well or get worse.   °Document Released: 03/31/2005 Document Revised: 04/11/2013 Document Reviewed: 02/28/2013 °[employer]® Patient  Information ©2014 [employer]. ° °

## 2013-07-28 NOTE — ED Notes (Signed)
Patient states that if she cannot have pain medicine then she does not want other medications/fluid ordered.

## 2013-07-28 NOTE — ED Notes (Signed)
Lab has attempted to stick patient x3, RN Eme Spinks attempted to stick patient x2 and RN Alla Feeling attempted to stick patient w/ ultrasound x1. All attempts were unsuccessful. MD Cheri Guppy was notified by charge nurse.

## 2013-07-28 NOTE — Progress Notes (Signed)
Pt refused arterial stick for labs.

## 2013-07-30 ENCOUNTER — Telehealth: Payer: Self-pay | Admitting: Internal Medicine

## 2013-07-30 ENCOUNTER — Telehealth: Payer: Self-pay | Admitting: Emergency Medicine
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
## 2013-08-01 ENCOUNTER — Telehealth: Payer: Self-pay | Admitting: Internal Medicine

## 2013-08-01 ENCOUNTER — Telehealth: Payer: Self-pay
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
## 2013-08-03 ENCOUNTER — Other Ambulatory Visit: Payer: Self-pay | Admitting: Internal Medicine

## 2013-08-03 MED ORDER — TRAMADOL HCL 50 MG PO TABS: 50.0000 mg | ORAL_TABLET | Freq: Three times a day (TID) | ORAL | Status: DC | PRN

## 2013-08-13 ENCOUNTER — Telehealth: Payer: Self-pay

## 2013-08-13 NOTE — Telephone Encounter (Signed)
Returned patient phone call Not available Left message on voice mail to return our call  

## 2013-08-13 NOTE — Telephone Encounter (Signed)
Patient was seen at pain management in [place] today States that dr would not prescribe pain meds based on her symptoms(abd pain) Needs referral to another pain management and also wants to know if we will continue To refill her tramadol

## 2013-08-20 MED ORDER — TRAMADOL HCL 50 MG PO TABS: 50.0000 mg | ORAL_TABLET | Freq: Two times a day (BID) | ORAL | Status: DC | PRN

## 2013-09-03 ENCOUNTER — Telehealth: Payer: Self-pay | Admitting: Internal Medicine

## 2013-09-03 NOTE — Telephone Encounter (Signed)
Patient needs refill on Tramadol...Marland KitchenMarland KitchenPlease call patient when medication is ready.

## 2013-09-04 ENCOUNTER — Telehealth: Payer: Self-pay | Admitting: Emergency Medicine

## 2013-09-04 NOTE — Telephone Encounter (Signed)
Do you want to refill pt Tramadol?

## 2013-09-10 ENCOUNTER — Telehealth: Payer: Self-pay | Admitting: Emergency Medicine

## 2013-09-10 ENCOUNTER — Other Ambulatory Visit: Payer: Self-pay | Admitting: Emergency Medicine

## 2013-09-10 MED ORDER — TRAMADOL HCL 50 MG PO TABS: 50.0000 mg | ORAL_TABLET | Freq: Two times a day (BID) | ORAL | Status: DC | PRN

## 2013-09-10 MED ORDER — ESTRADIOL 2 MG PO TABS: 2.0000 mg | ORAL_TABLET | Freq: Every day | ORAL | Status: DC

## 2013-09-10 NOTE — Telephone Encounter (Signed)
Pt comes in person requesting medication refill Tramadol. States she was denied at pain clinic Tramadol #90,no refills given Estrace medication reordered and e-scribed to [facility]

## 2013-09-14 ENCOUNTER — Ambulatory Visit: Payer: Self-pay

## 2013-09-17 ENCOUNTER — Ambulatory Visit: Payer: Self-pay

## 2013-09-28 ENCOUNTER — Emergency Department (HOSPITAL_COMMUNITY)
Admission: EM | Admit: 2013-09-28 | Discharge: 2013-09-28 | Discharge status: Against Medical Advice | Payer: Medicaid Other | Attending: Emergency Medicine | Admitting: Emergency Medicine

## 2013-09-28 ENCOUNTER — Encounter (HOSPITAL_COMMUNITY): Payer: Self-pay | Admitting: Emergency Medicine

## 2013-09-28 DIAGNOSIS — L299 Pruritus, unspecified: Secondary | ICD-10-CM | POA: Insufficient documentation

## 2013-09-28 DIAGNOSIS — R112 Nausea with vomiting, unspecified: Secondary | ICD-10-CM | POA: Insufficient documentation

## 2013-09-28 DIAGNOSIS — R197 Diarrhea, unspecified: Secondary | ICD-10-CM | POA: Insufficient documentation

## 2013-09-28 DIAGNOSIS — R109 Unspecified abdominal pain: Secondary | ICD-10-CM | POA: Insufficient documentation

## 2013-09-28 DIAGNOSIS — F17200 Nicotine dependence, unspecified, uncomplicated: Secondary | ICD-10-CM | POA: Insufficient documentation

## 2013-09-28 NOTE — ED Notes (Signed)
No answer

## 2013-09-28 NOTE — ED Notes (Signed)
Unable to find, no answer.

## 2013-09-28 NOTE — ED Notes (Addendum)
C/o abd pain and nvd. Mentions itchiness. Ongoing since 3 weeks ago. Gradually progressively worse. Describes as "pancreatitis flare up". Last emesis PTA (milky). Last BM PTA (runny, greenish, watery). (denies: bleeding or fever), mentions "hot and cold". Last ate 1700. Denies ETOH. "Not able to keep meds down".

## 2013-10-07 ENCOUNTER — Emergency Department (INDEPENDENT_AMBULATORY_CARE_PROVIDER_SITE_OTHER)
Admission: EM | Admit: 2013-10-07 | Discharge: 2013-10-07 | Disposition: A | Discharge status: Home / Self Care | Payer: Medicaid Other | Source: Home / Self Care | Attending: Emergency Medicine | Admitting: Emergency Medicine

## 2013-10-07 DIAGNOSIS — R109 Unspecified abdominal pain: Secondary | ICD-10-CM

## 2013-10-07 DIAGNOSIS — R740 Nonspecific elevation of levels of transaminase and lactic acid dehydrogenase [LDH]: Secondary | ICD-10-CM

## 2013-10-07 DIAGNOSIS — R7401 Elevation of levels of liver transaminase levels: Secondary | ICD-10-CM

## 2013-10-07 LAB — CBC WITH DIFFERENTIAL/PLATELET
Basophils Absolute: 0 10*3/uL (ref 0.0–0.1)
Basophils Relative: 1 % (ref 0–1)
EOS PCT: 5 % (ref 0–5)
Eosinophils Absolute: 0.4 10*3/uL (ref 0.0–0.7)
HEMATOCRIT: 39.4 % (ref 36.0–46.0)
HEMOGLOBIN: 13.5 g/dL (ref 12.0–15.0)
LYMPHS ABS: 2.8 10*3/uL (ref 0.7–4.0)
LYMPHS PCT: 35 % (ref 12–46)
MCH: 28.2 pg (ref 26.0–34.0)
MCHC: 34.3 g/dL (ref 30.0–36.0)
MCV: 82.3 fL (ref 78.0–100.0)
MONO ABS: 0.7 10*3/uL (ref 0.1–1.0)
MONOS PCT: 10 % (ref 3–12)
NEUTROS ABS: 3.9 10*3/uL (ref 1.7–7.7)
Neutrophils Relative %: 49 % (ref 43–77)
Platelets: 304 10*3/uL (ref 150–400)
RBC: 4.79 MIL/uL (ref 3.87–5.11)
RDW: 11.7 % (ref 11.5–15.5)
WBC: 7.8 10*3/uL (ref 4.0–10.5)

## 2013-10-07 LAB — COMPREHENSIVE METABOLIC PANEL
ALT: 171 U/L — ABNORMAL HIGH (ref 0–35)
AST: 308 U/L — ABNORMAL HIGH (ref 0–37)
Albumin: 4 g/dL (ref 3.5–5.2)
Alkaline Phosphatase: 195 U/L — ABNORMAL HIGH (ref 39–117)
BILIRUBIN TOTAL: 0.3 mg/dL (ref 0.3–1.2)
BUN: 15 mg/dL (ref 6–23)
CALCIUM: 9.3 mg/dL (ref 8.4–10.5)
CHLORIDE: 102 meq/L (ref 96–112)
CO2: 25 meq/L (ref 19–32)
CREATININE: 0.88 mg/dL (ref 0.50–1.10)
GFR, EST NON AFRICAN AMERICAN: 87 mL/min — AB (ref >90)
GLUCOSE: 80 mg/dL (ref 70–99)
Potassium: 3.5 mEq/L — ABNORMAL LOW (ref 3.7–5.3)
Sodium: 143 mEq/L (ref 137–147)
Total Protein: 8.3 g/dL (ref 6.0–8.3)

## 2013-10-07 LAB — LIPASE, BLOOD: Lipase: 27 U/L (ref 11–59)

## 2013-10-07 NOTE — ED Notes (Signed)
Physician triaged and assessed pt before nurse went in. 

## 2013-10-07 NOTE — ED Provider Notes (Signed)
Medical screening examination/treatment/procedure(s) were performed by non-physician practitioner and as supervising physician I was immediately available for consultation/collaboration.  Philipp Deputy, M.D.  Harden Mo, MD 10/07/13 2112

## 2013-10-07 NOTE — ED Provider Notes (Signed)
CSN: [id number]     Arrival date & time 10/07/13  1623 History   First MD Initiated Contact with Patient 10/07/13 1729     No chief complaint on file.  (Consider location/radiation/quality/duration/timing/severity/associated sxs/prior Treatment) HPI Comments: 33 year old female presents for evaluation of nausea, vomiting, abdominal pain. She states "I am having a flareup of pancreatitis." She has 3 weeks of epigastric pain with nausea, vomiting, diarrhea. She is also having skin itching, she says this is typical with a flare of her pancreatitis. She has had multiple procedures in the past to help his problem but none has worked. She says that when she feels like this, she usually ends up being admitted into the hospital. She says that she is not taking any medications at home for this. She is also feeling slightly lightheaded. Denies any other symptoms. Rates pain 10 out of 10 in the epigastrium, boring in nature, going through to the mid back.   Past Medical History  Diagnosis Date  . Pancreatitis   . S/P laparoscopic cholecystectomy   . Fibroids   . Ovarian cyst   . Chronic abdominal pain   . Anemia    Past Surgical History  Procedure Laterality Date  . Cholecystectomy    . Dilation and curettage of uterus    . Ercp w/ metal stent placement    . Oophorectomy     Family History  Problem Relation Age of Onset  . Diabetes Mother   . Hypertension Mother   . Hypertension Father   . Diabetes Father   . Asthma Daughter    History  Substance Use Topics  . Smoking status: Current Every Day Smoker -- 0.25 packs/day for 10 years    Types: Cigarettes    Last Attempt to Quit: 09/01/2012  . Smokeless tobacco: Never Used  . Alcohol Use: No   OB History   Grav Para Term Preterm Abortions TAB SAB Ect Mult Living   4 2 1 1 2  2         Review of Systems  Gastrointestinal: Positive for nausea, vomiting, abdominal pain and diarrhea.  Skin:       Itching skin  Neurological: Positive for  light-headedness.    Allergies  Celecoxib; Coconut flavor; Fentanyl; Ibuprofen; Ketorolac tromethamine; Meperidine hcl; Morphine; Ondansetron; Other; Shellfish allergy; Tramadol; Propoxycaine; Protonix; Robitussin (alcohol free); Aspirin; Ketamine; Propoxyphene n-acetaminophen; and Tylenol  Home Medications   Current Outpatient Rx  Name  Route  Sig  Dispense  Refill  . estradiol (ESTRACE) 2 MG tablet   Oral   Take 1 tablet (2 mg total) by mouth at bedtime.   30 tablet   2   . metoCLOPramide (REGLAN) 10 MG tablet   Oral   Take 1 tablet (10 mg total) by mouth every 6 (six) hours as needed for nausea.   30 tablet   0   . promethazine (PHENERGAN) 25 MG suppository   Rectal   Place 1 suppository (25 mg total) rectally every 6 (six) hours as needed for nausea or vomiting.   12 each   0   . traMADol (ULTRAM) 50 MG tablet   Oral   Take 1 tablet (50 mg total) by mouth every 12 (twelve) hours as needed for severe pain.   90 tablet   0    BP 126/87  Pulse 73  Temp(Src) 98.3 F (36.8 C) (Oral)  Resp 16  SpO2 100%  LMP 04/30/2013 Physical Exam  Nursing note and vitals reviewed. Constitutional: She is oriented  to person, place, and time. Vital signs are normal. She appears well-developed and well-nourished. No distress.  HENT:  Head: Normocephalic and atraumatic.  Cardiovascular:  No peripheral edema  Pulmonary/Chest: Effort normal. No respiratory distress.  Abdominal: Normal appearance and bowel sounds are normal. She exhibits no distension. There is no hepatosplenomegaly. There is tenderness in the epigastric area and left upper quadrant. There is guarding (voluntary). There is no rigidity, no rebound, no CVA tenderness, no tenderness at McBurney's point and negative Murphy's sign.  Neurological: She is alert and oriented to person, place, and time. She has normal strength. Coordination normal.  Skin: Skin is warm and dry. No rash noted. She is not diaphoretic.  Psychiatric:  She has a normal mood and affect. Judgment normal.    ED Course  Procedures (including critical care time) Labs Review Labs Reviewed  COMPREHENSIVE METABOLIC PANEL - Abnormal; Notable for the following:    Potassium 3.5 (*)    AST 308 (*)    ALT 171 (*)    Alkaline Phosphatase 195 (*)    GFR calc non Af Amer 87 (*)    All other components within normal limits  CBC WITH DIFFERENTIAL  LIPASE, BLOOD   Imaging Review No results found.   MDM   1. Abdominal pain   2. Transaminitis     On review of the chart, this patient has frequent visits for this problem. There are multiple warnings about not doing imaging and not giving this patient narcotics due to a history of drug-seeking behavior and Munchausen syndrome. However, review of her labs indicates that she doesn't fact have frequent elevations of her lipase and her transaminases. We'll check CBC, CMP, lipase, and go from there. If her lipase were transaminases are grossly elevated, we will transfer to the emergency department for further evaluation and treatment.  The labs came back revealing significant transaminitis with a normal lipase. She has a history of transaminitis, but her enzymes are rarely ever been this high and her alkaline phosphatase has never been this high. She is being transferred to the emergency department via shuttle for further evaluation.      Transaminases came back extremely elevated. Patient should be transferred to the emergency room for further evaluation. She refuses and says she just needs pain medicine. I have explained to her that this is not an option, that sending her home after we just found these lab values is not safe. She says she will not go to the emergency room just see her doctor tomorrow. I have explained to her that this is unsafe, she says she doesn't care. I explained to her before urine labs that if they came back abnormal, she would be transferred to the emergency room. At that time she  was agreeable to this, but now she appears to just be wanting to get pain medicine and she doesn't care about her actual medical condition. Patient left AMA and signed an AMA form.  Liam Graham, PA-C 10/07/13 1925

## 2013-10-19 ENCOUNTER — Other Ambulatory Visit: Payer: Self-pay | Admitting: Internal Medicine

## 2013-10-31 ENCOUNTER — Ambulatory Visit: Payer: Self-pay | Admitting: Internal Medicine

## 2013-11-14 ENCOUNTER — Emergency Department (INDEPENDENT_AMBULATORY_CARE_PROVIDER_SITE_OTHER)
Admission: EM | Admit: 2013-11-14 | Discharge: 2013-11-14 | Disposition: A | Discharge status: Home / Self Care | Payer: Medicaid Other | Source: Home / Self Care

## 2013-11-14 DIAGNOSIS — N912 Amenorrhea, unspecified: Secondary | ICD-10-CM

## 2013-11-14 NOTE — ED Provider Notes (Signed)
CSN: [id number]     Arrival date & time 11/14/13  1917 History   First MD Initiated Contact with Patient 11/14/13 2017     No chief complaint on file.  (Consider location/radiation/quality/duration/timing/severity/associated sxs/prior Treatment) HPI Comments: Pt requesting preg test. No other complaints. No pain, bleeding or other sx's.   Past Medical History  Diagnosis Date  . Pancreatitis   . S/P laparoscopic cholecystectomy   . Fibroids   . Ovarian cyst   . Chronic abdominal pain   . Anemia    Past Surgical History  Procedure Laterality Date  . Cholecystectomy    . Dilation and curettage of uterus    . Ercp w/ metal stent placement    . Oophorectomy     Family History  Problem Relation Age of Onset  . Diabetes Mother   . Hypertension Mother   . Hypertension Father   . Diabetes Father   . Asthma Daughter    History  Substance Use Topics  . Smoking status: Current Every Day Smoker -- 0.25 packs/day for 10 years    Types: Cigarettes    Last Attempt to Quit: 09/01/2012  . Smokeless tobacco: Never Used  . Alcohol Use: No   OB History   Grav Para Term Preterm Abortions TAB SAB Ect Mult Living   4 2 1 1 2  2         Review of Systems  All other systems reviewed and are negative.   Allergies  Celecoxib; Coconut flavor; Fentanyl; Ibuprofen; Ketorolac tromethamine; Meperidine hcl; Morphine; Ondansetron; Other; Shellfish allergy; Tramadol; Propoxycaine; Protonix; Robitussin (alcohol free); Aspirin; Ketamine; Propoxyphene n-acetaminophen; and Tylenol  Home Medications   Prior to Admission medications   Medication Sig Start Date End Date Taking? Authorizing Provider  estradiol (ESTRACE) 2 MG tablet Take 1 tablet (2 mg total) by mouth at bedtime. 09/10/13   Reyne Dumas, MD  metoCLOPramide (REGLAN) 10 MG tablet Take 1 tablet (10 mg total) by mouth every 6 (six) hours as needed for nausea. 73/22/02   Delora Fuel, MD  promethazine (PHENERGAN) 25 MG suppository Place 1  suppository (25 mg total) rectally every 6 (six) hours as needed for nausea or vomiting. 07/28/13   Elyn Peers, MD  traMADol (ULTRAM) 50 MG tablet Take 1 tablet (50 mg total) by mouth every 12 (twelve) hours as needed for severe pain. 09/10/13   Angelica Chessman, MD   BP 107/73  Pulse 83  Temp(Src) 99.1 F (37.3 C) (Oral)  Resp 16  SpO2 96%  LMP 04/30/2013 Physical Exam  Nursing note and vitals reviewed. Constitutional: She is oriented to person, place, and time. She appears well-developed and well-nourished. No distress.  Pt on phone most of the time as observed while door open and during hx. No distress or signs of pain disorder.  Neck: Normal range of motion. Neck supple.  Pulmonary/Chest: Effort normal. No respiratory distress.  Neurological: She is alert and oriented to person, place, and time. She exhibits normal muscle tone.  Skin: Skin is warm and dry.    ED Course  Procedures (including critical care time) Labs Review Labs Reviewed - No data to display  Imaging Review No results found.   MDM   1. Amenorrhea      As I was entering the order fot the Preg test the pt st her ride could not wait any longer and had to go. Did not wish to have the test performed, left.   Janne Napoleon, NP 11/14/13 2034

## 2013-11-14 NOTE — Discharge Instructions (Signed)
Secondary Amenorrhea   Secondary amenorrhea is the stopping of menstrual flow for 3 6 months in a female who has previously had periods. There are many possible causes. Most of these causes are not serious. Usually, treating the underlying problem causing the loss of menses will return your periods to normal.  CAUSES   Some common and uncommon causes of not menstruating include:   Malnutrition.   Low blood sugar (hypoglycemia).   Polycystic ovary disease.   Stress or fear.   Breastfeeding.   Hormone imbalance.   Ovarian failure.   Medicines.   Extreme obesity.   Cystic fibrosis.   Low body weight or drastic weight reduction from any cause.   Early menopause.   Removal of ovaries or uterus.   Contraceptives.   Illness.   Long-term (chronic) illnesses.   Cushing syndrome.   Thyroid problems.   Birth control pills, patches, or vaginal rings for birth control.  RISK FACTORS  You may be at greater risk of secondary amenorrhea if:   You have a family history of this condition.   You have an eating disorder.   You do athletic training.  DIAGNOSIS   A diagnosis is made by your health care provider taking a medical history and doing a physical exam. This will include a pelvic exam to check for problems with your reproductive organs. Pregnancy must be ruled out. Often, numerous blood tests are done to measure different hormones in the body. Urine testing may be done. Specialized exams (ultrasound, CT scan, MRI, or hysteroscopy) may have to be done as well as measuring the body mass index (BMI).  TREATMENT   Treatment depends on the cause of the amenorrhea. If an eating disorder is present, this can be treated with an adequate diet and therapy. Chronic illnesses may improve with treatment of the illness. Amenorrhea may be corrected with medicines, lifestyle changes, or surgery. If the amenorrhea cannot be corrected, it is sometimes possible to create a false menstruation with medicines.  HOME CARE  INSTRUCTIONS   Maintain a healthy diet.   Manage weight problems.   Exercise regularly but not excessively.   Get adequate sleep.   Manage stress.   Be aware of changes in your menstrual cycle. Keep a record of when your periods occur. Note the date your period starts, how long it lasts, and any problems.  SEEK MEDICAL CARE IF:  Your symptoms do not get better with treatment.  Document Released: 08/02/2006 Document Revised: 02/21/2013 Document Reviewed: 12/07/2012  [employer] Patient Information 2014 [employer].

## 2013-11-14 NOTE — ED Notes (Signed)
Patient reporting her ride is here and unable to stay.

## 2013-11-17 NOTE — ED Provider Notes (Signed)
Medical screening examination/treatment/procedure(s) were performed by non-physician practitioner and as supervising physician I was immediately available for consultation/collaboration.  Philipp Deputy, M.D.  Harden Mo, MD 11/17/13 [phone]

## 2013-11-20 ENCOUNTER — Ambulatory Visit: Payer: Medicaid Other | Attending: Internal Medicine | Admitting: Internal Medicine

## 2013-11-20 ENCOUNTER — Encounter: Payer: Self-pay | Admitting: Internal Medicine

## 2013-11-20 VITALS — BP 108/72 | HR 74 | Temp 98.5°F | Resp 16 | Ht 65.0 in | Wt 145.4 lb

## 2013-11-20 DIAGNOSIS — K861 Other chronic pancreatitis: Secondary | ICD-10-CM | POA: Diagnosis not present

## 2013-11-20 DIAGNOSIS — R1013 Epigastric pain: Secondary | ICD-10-CM | POA: Diagnosis present

## 2013-11-20 DIAGNOSIS — F17200 Nicotine dependence, unspecified, uncomplicated: Secondary | ICD-10-CM | POA: Diagnosis not present

## 2013-11-20 MED ORDER — TRAMADOL HCL 50 MG PO TABS: 50.0000 mg | ORAL_TABLET | Freq: Two times a day (BID) | ORAL | Status: DC

## 2013-11-20 MED ORDER — METOCLOPRAMIDE HCL 10 MG PO TABS: 10.0000 mg | ORAL_TABLET | Freq: Four times a day (QID) | ORAL | Status: DC | PRN

## 2013-11-20 NOTE — Patient Instructions (Signed)
Acute Pancreatitis °Acute pancreatitis is a disease in which the pancreas becomes suddenly inflamed. The pancreas is a large gland located behind your stomach. The pancreas produces enzymes that help digest food. The pancreas also releases the hormones glucagon and insulin that help regulate blood sugar. Damage to the pancreas occurs when the digestive enzymes from the pancreas are activated and begin attacking the pancreas before being released into the intestine. Most acute attacks last a couple of days and can cause serious complications. Some people become dehydrated and develop low blood pressure. In severe cases, bleeding into the pancreas can lead to shock and can be life-threatening. The lungs, heart, and kidneys may fail. °CAUSES  °Pancreatitis can happen to anyone. In some cases, the cause is unknown. Most cases are caused by: °· Alcohol abuse. °· Gallstones. °Other less common causes are: °· Certain medicines. °· Exposure to certain chemicals. °· Infection. °· Damage caused by an accident (trauma). °· Abdominal surgery. °SYMPTOMS  °· Pain in the upper abdomen that may radiate to the back. °· Tenderness and swelling of the abdomen. °· Nausea and vomiting. °DIAGNOSIS  °Your caregiver will perform a physical exam. Blood and stool tests may be done to confirm the diagnosis. Imaging tests may also be done, such as X-rays, CT scans, or an ultrasound of the abdomen. °TREATMENT  °Treatment usually requires a stay in the hospital. Treatment may include: °· Pain medicine. °· Fluid replacement through an intravenous line (IV). °· Placing a tube in the stomach to remove stomach contents and control vomiting. °· Not eating for 3 or 4 days. This gives your pancreas a rest, because enzymes are not being produced that can cause further damage. °· Antibiotic medicines if your condition is caused by an infection. °· Surgery of the pancreas or gallbladder. °HOME CARE INSTRUCTIONS  °· Follow the diet advised by your  caregiver. This may involve avoiding alcohol and decreasing the amount of fat in your diet. °· Eat smaller, more frequent meals. This reduces the amount of digestive juices the pancreas produces. °· Drink enough fluids to keep your urine clear or pale yellow. °· Only take over-the-counter or prescription medicines as directed by your caregiver. °· Avoid drinking alcohol if it caused your condition. °· Do not smoke. °· Get plenty of rest. °· Check your blood sugar at home as directed by your caregiver. °· Keep all follow-up appointments as directed by your caregiver. °SEEK MEDICAL CARE IF:  °· You do not recover as quickly as expected. °· You develop new or worsening symptoms. °· You have persistent pain, weakness, or nausea. °· You recover and then have another episode of pain. °SEEK IMMEDIATE MEDICAL CARE IF:  °· You are unable to eat or keep fluids down. °· Your pain becomes severe. °· You have a fever or persistent symptoms for more than 2 to 3 days. °· You have a fever and your symptoms suddenly get worse. °· Your skin or the white part of your eyes turn yellow (jaundice). °· You develop vomiting. °· You feel dizzy, or you faint. °· Your blood sugar is high (over 300 mg/dL). °MAKE SURE YOU:  °· Understand these instructions. °· Will watch your condition. °· Will get help right away if you are not doing well or get worse. °Document Released: 06/21/2005 Document Revised: 12/21/2011 Document Reviewed: 09/30/2011 °[employer]® Patient Information ©2014 [employer]. ° °

## 2013-11-20 NOTE — Progress Notes (Signed)
Patient is here for F/U chronic epigastric pain and for med refills.

## 2013-11-20 NOTE — Progress Notes (Addendum)
Patient ID: [name], female   DOB: Dec 25, 1980, 33 y.o.   MRN: [id number]  CC: epigastric pain  HPI:  Patient presents today with a history of chronic pancreatitis since 2005.  She has had her gallbladder removed that same year.  She reports that she has had continued epigastric abdominal pain even after surgery.  Patient states that she has only been able to nibble on small amounts of food at a time to prevent pain.  She reports that she has been evealuated by a Copywriter, advertising in [place] one year prior.  She reports that they ran test on her but never explained why she has chronic pancreatitis.  Past medical charts reveal that patient has refused treatment because she feels it will not help and only wants pain medication.   Allergies  Allergen Reactions  . Celecoxib Anaphylaxis and Swelling  . Coconut Flavor Shortness Of Breath and Swelling  . Fentanyl Anaphylaxis and Rash  . Ibuprofen Anaphylaxis and Rash  . Ketorolac Tromethamine Anaphylaxis  . Meperidine Hcl Anaphylaxis  . Morphine Anaphylaxis and Rash  . Ondansetron Anaphylaxis  . Other Shortness Of Breath and Swelling    GI cocktail  . Shellfish Allergy Anaphylaxis    Crab and lobster  . Tramadol Rash  . Propoxycaine Other (See Comments)    Throat swelling  . Protonix [Pantoprazole Sodium]     Throat Swelling.   . Robitussin (Alcohol Free) [Guaifenesin] Itching  . Aspirin Rash  . Ketamine Itching  . Propoxyphene N-Acetaminophen Rash  . Tylenol [Acetaminophen] Hives, Itching and Rash   Past Medical History  Diagnosis Date  . Pancreatitis   . S/P laparoscopic cholecystectomy   . Fibroids   . Ovarian cyst   . Chronic abdominal pain   . Anemia    Current Outpatient Prescriptions on File Prior to Visit  Medication Sig Dispense Refill  . estradiol (ESTRACE) 2 MG tablet Take 1 tablet (2 mg total) by mouth at bedtime.  30 tablet  2  . traMADol (ULTRAM) 50 MG tablet Take 1 tablet (50 mg total) by mouth  every 12 (twelve) hours as needed for severe pain.  90 tablet  0  . metoCLOPramide (REGLAN) 10 MG tablet Take 1 tablet (10 mg total) by mouth every 6 (six) hours as needed for nausea.  30 tablet  0  . promethazine (PHENERGAN) 25 MG suppository Place 1 suppository (25 mg total) rectally every 6 (six) hours as needed for nausea or vomiting.  12 each  0   No current facility-administered medications on file prior to visit.   Family History  Problem Relation Age of Onset  . Diabetes Mother   . Hypertension Mother   . Hypertension Father   . Diabetes Father   . Asthma Daughter    History   Social History  . Marital Status: Married    Spouse Name: N/A    Number of Children: N/A  . Years of Education: N/A   Occupational History  . stay at home mom    Social History Main Topics  . Smoking status: Current Every Day Smoker -- 0.25 packs/day for 10 years    Types: Cigarettes    Last Attempt to Quit: 09/01/2012  . Smokeless tobacco: Never Used  . Alcohol Use: No  . Drug Use: No  . Sexual Activity: Yes    Birth Control/ Protection: Surgical   Other Topics Concern  . Not on file   Social History Narrative  . No narrative on file  Review of Systems  Respiratory: Negative.   Cardiovascular: Negative.   Gastrointestinal: Positive for abdominal pain (epigastric pain). Negative for heartburn, nausea, vomiting, diarrhea, constipation and blood in stool.  Genitourinary: Negative.   Musculoskeletal: Negative.      Review of Systems: Constitutional: Negative for fever, chills, diaphoresis, activity change, appetite change and fatigue. HENT: Negative for ear pain, nosebleeds, congestion, facial swelling, rhinorrhea, neck pain, neck stiffness and ear discharge.  Eyes: Negative for pain, discharge, redness, itching and visual disturbance. Respiratory: Negative for cough, choking, chest tightness, shortness of breath, wheezing and stridor.  Cardiovascular: Negative for chest pain,  palpitations and leg swelling. Gastrointestinal: Negative for abdominal distention. Genitourinary: Negative for dysuria, urgency, frequency, hematuria, flank pain, decreased urine volume, difficulty urinating and dyspareunia.  Musculoskeletal: Negative for back pain, joint swelling, arthralgias and gait problem. Neurological: Negative for dizziness, tremors, seizures, syncope, facial asymmetry, speech difficulty, weakness, light-headedness, numbness and headaches.  Hematological: Negative for adenopathy. Does not bruise/bleed easily. Psychiatric/Behavioral: Negative for hallucinations, behavioral problems, confusion, dysphoric mood, decreased concentration and agitation.    Objective:   Filed Vitals:   11/20/13 1546  BP: 108/72  Pulse: 74  Temp: 98.5 F (36.9 C)  Resp: 16   Physical Exam  Nursing note and vitals reviewed. Constitutional: She appears well-nourished.  Cardiovascular: Normal rate, regular rhythm and normal heart sounds.   Pulmonary/Chest: Effort normal and breath sounds normal.  Abdominal: Soft. She exhibits no distension and no mass. There is tenderness (epigastric. upper left and right quadrants).  Hyperactive bowel sounds in all quadrants   Lymphadenopathy:    She has no cervical adenopathy.  Skin: Skin is warm and dry.  Psychiatric: She has a normal mood and affect. Thought content normal.     Lab Results  Component Value Date   WBC 7.8 10/07/2013   HGB 13.5 10/07/2013   HCT 39.4 10/07/2013   MCV 82.3 10/07/2013   PLT 304 10/07/2013   Lab Results  Component Value Date   CREATININE 0.88 10/07/2013   BUN 15 10/07/2013   NA 143 10/07/2013   K 3.5* 10/07/2013   CL 102 10/07/2013   CO2 25 10/07/2013    No results found for this basename: HGBA1C   Lipid Panel     Component Value Date/Time   CHOL 185 02/05/2011 0705   TRIG 100 02/05/2011 0705   HDL 66 02/05/2011 0705   CHOLHDL 2.8 02/05/2011 0705   VLDL 20 02/05/2011 0705   LDLCALC 99 02/05/2011 0705       Assessment and  plan:   Alyssah was seen today for follow-up and pancreatitis.  Diagnoses and associated orders for this visit:  Chronic pancreatitis - metoCLOPramide (REGLAN) 10 MG tablet; Take 1 tablet (10 mg total) by mouth every 6 (six) hours as needed for nausea. - traMADol (ULTRAM) 50 MG tablet; Take 1 tablet (50 mg total) by mouth 2 (two) times daily. Do not take more than twice daily.  Patient reports she has been taking tramadol and does not have an allergy to tramadol. - Ambulatory referral to Gastroenterology. Stressed the importance of maintaining care with a gastroenterologist to assist with chronic ongoing problem.  Return in about 2 months (around 01/20/2014).  Chari Manning, NP-C [facility] [phone] 11/21/2013, 3:18 PM

## 2013-11-21 ENCOUNTER — Encounter: Payer: Self-pay | Admitting: Internal Medicine

## 2013-12-17 ENCOUNTER — Telehealth: Payer: Self-pay | Admitting: Internal Medicine

## 2013-12-17 NOTE — Telephone Encounter (Signed)
Pt calling for traMADol (ULTRAM) 50 MG tablet refill. Please f/u with pt, last office visit was on 11/20/13.

## 2013-12-18 ENCOUNTER — Telehealth: Payer: Self-pay | Admitting: Emergency Medicine

## 2013-12-18 NOTE — Telephone Encounter (Signed)
Pt does have appt with GI on 7/14. Requesting refill Tramadol until then

## 2013-12-18 NOTE — Telephone Encounter (Signed)
Pt requesting refill Tramadol. Last given 11/20/13 Please f/u

## 2013-12-18 NOTE — Telephone Encounter (Signed)
Pt calling back to return nurse's call.

## 2013-12-18 NOTE — Telephone Encounter (Signed)
Patient needs to see GI doctor. I sent her a referral so that she can stop takign pain medication and find the origin of the problem. Find out where she is in the process.

## 2013-12-18 NOTE — Telephone Encounter (Signed)
Left message for pt to call clinic when message is received

## 2013-12-18 NOTE — Telephone Encounter (Signed)
Pt returning call for nurse

## 2013-12-18 NOTE — Telephone Encounter (Signed)
May send refill. Please reschedule patient f/u appt with me until after her appointment with GI. Thanks

## 2013-12-19 ENCOUNTER — Telehealth: Payer: Self-pay | Admitting: Emergency Medicine

## 2013-12-19 DIAGNOSIS — K861 Other chronic pancreatitis: Secondary | ICD-10-CM

## 2013-12-19 MED ORDER — TRAMADOL HCL 50 MG PO TABS: 50.0000 mg | ORAL_TABLET | Freq: Two times a day (BID) | ORAL | Status: DC

## 2013-12-19 NOTE — Telephone Encounter (Signed)
Pt informed she may pick script Tramadol 50 mg tab at front desk per Mateo Flow, NP

## 2013-12-19 NOTE — Telephone Encounter (Signed)
Pt informed she can pick script Tramadol 50 mg tab at front desk and reminded to keep GI appt

## 2014-01-15 ENCOUNTER — Encounter (HOSPITAL_COMMUNITY): Payer: Self-pay | Admitting: Emergency Medicine

## 2014-01-15 ENCOUNTER — Emergency Department (HOSPITAL_COMMUNITY)
Admission: EM | Admit: 2014-01-15 | Discharge: 2014-01-15 | Discharge status: Against Medical Advice | Payer: Medicaid Other | Attending: Emergency Medicine | Admitting: Emergency Medicine

## 2014-01-15 ENCOUNTER — Other Ambulatory Visit: Payer: Self-pay | Admitting: Internal Medicine

## 2014-01-15 DIAGNOSIS — G8929 Other chronic pain: Secondary | ICD-10-CM | POA: Diagnosis not present

## 2014-01-15 DIAGNOSIS — Z9089 Acquired absence of other organs: Secondary | ICD-10-CM | POA: Insufficient documentation

## 2014-01-15 DIAGNOSIS — Z8742 Personal history of other diseases of the female genital tract: Secondary | ICD-10-CM | POA: Diagnosis not present

## 2014-01-15 DIAGNOSIS — R109 Unspecified abdominal pain: Secondary | ICD-10-CM | POA: Diagnosis present

## 2014-01-15 DIAGNOSIS — F17200 Nicotine dependence, unspecified, uncomplicated: Secondary | ICD-10-CM | POA: Diagnosis not present

## 2014-01-15 NOTE — ED Notes (Signed)
Pt. Having abdominal pain for 5 days with n/v/d blood in her bowel.  Pt. Is alert and oriented X3.  Skin is warm and dry.

## 2014-01-15 NOTE — ED Notes (Addendum)
Patient approached desk and stated that she is planning to leave. Explains that if things get worse she will seek care again. Encouraged patient to stay for evaluation, but patient declined. Apologies made for wait time. Patient returned pager and exited department.

## 2014-01-18 ENCOUNTER — Ambulatory Visit: Payer: Self-pay | Admitting: Internal Medicine

## 2014-01-22 ENCOUNTER — Ambulatory Visit: Payer: Self-pay | Admitting: Internal Medicine

## 2014-01-29 ENCOUNTER — Other Ambulatory Visit (HOSPITAL_COMMUNITY): Payer: Self-pay | Admitting: Nurse Practitioner

## 2014-01-29 DIAGNOSIS — O3680X1 Pregnancy with inconclusive fetal viability, fetus 1: Secondary | ICD-10-CM

## 2014-01-30 ENCOUNTER — Encounter: Payer: Self-pay | Admitting: *Deleted

## 2014-02-01 ENCOUNTER — Ambulatory Visit (HOSPITAL_COMMUNITY): Admission: RE | Admit: 2014-02-01 | Payer: Medicaid Other | Source: Ambulatory Visit

## 2014-02-18 ENCOUNTER — Telehealth: Payer: Self-pay | Admitting: Internal Medicine

## 2014-02-18 NOTE — Telephone Encounter (Signed)
Pt needs refill for Tramadol Please f/u with Patient

## 2014-02-19 ENCOUNTER — Telehealth: Payer: Self-pay | Admitting: *Deleted

## 2014-02-19 NOTE — Telephone Encounter (Signed)
Patient states that GI office called her to cancel her appointment. She states that they told her there was nothing they could do for her as they had seen her before for same problem.

## 2014-02-19 NOTE — Telephone Encounter (Signed)
Patient will not get any more refills of tramadol from this clinic.  Patient missed her GI appointment and therefore I can no longer assist her with pain medication. She will need to see GI for further management of abdominal pain

## 2014-02-19 NOTE — Telephone Encounter (Signed)
Message left on Patient's VM to return call.

## 2014-03-01 ENCOUNTER — Ambulatory Visit (HOSPITAL_COMMUNITY): Admission: RE | Admit: 2014-03-01 | Payer: Medicaid Other | Source: Ambulatory Visit

## 2014-03-13 ENCOUNTER — Encounter: Payer: Medicaid Other | Admitting: Obstetrics

## 2014-03-16 ENCOUNTER — Encounter (HOSPITAL_COMMUNITY): Payer: Self-pay | Admitting: Emergency Medicine

## 2014-03-16 ENCOUNTER — Emergency Department (HOSPITAL_COMMUNITY)
Admission: EM | Admit: 2014-03-16 | Discharge: 2014-03-16 | Disposition: A | Discharge status: Home / Self Care | Payer: Medicaid Other | Attending: Emergency Medicine | Admitting: Emergency Medicine

## 2014-03-16 DIAGNOSIS — Z79899 Other long term (current) drug therapy: Secondary | ICD-10-CM | POA: Diagnosis not present

## 2014-03-16 DIAGNOSIS — M25569 Pain in unspecified knee: Secondary | ICD-10-CM | POA: Insufficient documentation

## 2014-03-16 DIAGNOSIS — Z8742 Personal history of other diseases of the female genital tract: Secondary | ICD-10-CM | POA: Insufficient documentation

## 2014-03-16 DIAGNOSIS — F17200 Nicotine dependence, unspecified, uncomplicated: Secondary | ICD-10-CM | POA: Insufficient documentation

## 2014-03-16 DIAGNOSIS — Z862 Personal history of diseases of the blood and blood-forming organs and certain disorders involving the immune mechanism: Secondary | ICD-10-CM | POA: Insufficient documentation

## 2014-03-16 DIAGNOSIS — M25562 Pain in left knee: Secondary | ICD-10-CM

## 2014-03-16 DIAGNOSIS — Z8719 Personal history of other diseases of the digestive system: Secondary | ICD-10-CM | POA: Diagnosis not present

## 2014-03-16 DIAGNOSIS — G8929 Other chronic pain: Secondary | ICD-10-CM | POA: Insufficient documentation

## 2014-03-16 NOTE — ED Notes (Signed)
Pt ambulated in hallway. Gait steady.  No new complaints.  Pt stating "It feels better with knee sleeve".

## 2014-03-16 NOTE — Discharge Instructions (Signed)
Arthralgia °Your caregiver has diagnosed you as suffering from an arthralgia. Arthralgia means there is pain in a joint. This can come from many reasons including: °· Bruising the joint which causes soreness (inflammation) in the joint. °· Wear and tear on the joints which occur as we grow older (osteoarthritis). °· Overusing the joint. °· Various forms of arthritis. °· Infections of the joint. °Regardless of the cause of pain in your joint, most of these different pains respond to anti-inflammatory drugs and rest. The exception to this is when a joint is infected, and these cases are treated with antibiotics, if it is a bacterial infection. °HOME CARE INSTRUCTIONS  °· Rest the injured area for as long as directed by your caregiver. Then slowly start using the joint as directed by your caregiver and as the pain allows. Crutches as directed may be useful if the ankles, knees or hips are involved. If the knee was splinted or casted, continue use and care as directed. If an stretchy or elastic wrapping bandage has been applied today, it should be removed and re-applied every 3 to 4 hours. It should not be applied tightly, but firmly enough to keep swelling down. Watch toes and feet for swelling, bluish discoloration, coldness, numbness or excessive pain. If any of these problems (symptoms) occur, remove the ace bandage and re-apply more loosely. If these symptoms persist, contact your caregiver or return to this location. °· For the first 24 hours, keep the injured extremity elevated on pillows while lying down. °· Apply ice for 15-20 minutes to the sore joint every couple hours while awake for the first half day. Then 03-04 times per day for the first 48 hours. Put the ice in a plastic bag and place a towel between the bag of ice and your skin. °· Wear any splinting, casting, elastic bandage applications, or slings as instructed. °· Only take over-the-counter or prescription medicines for pain, discomfort, or fever as  directed by your caregiver. Do not use aspirin immediately after the injury unless instructed by your physician. Aspirin can cause increased bleeding and bruising of the tissues. °· If you were given crutches, continue to use them as instructed and do not resume weight bearing on the sore joint until instructed. °Persistent pain and inability to use the sore joint as directed for more than 2 to 3 days are warning signs indicating that you should see a caregiver for a follow-up visit as soon as possible. Initially, a hairline fracture (break in bone) may not be evident on X-rays. Persistent pain and swelling indicate that further evaluation, non-weight bearing or use of the joint (use of crutches or slings as instructed), or further X-rays are indicated. X-rays may sometimes not show a small fracture until a week or 10 days later. Make a follow-up appointment with your own caregiver or one to whom we have referred you. A radiologist (specialist in reading X-rays) may read your X-rays. Make sure you know how you are to obtain your X-ray results. Do not assume everything is normal if you do not hear from us. °SEEK MEDICAL CARE IF: °Bruising, swelling, or pain increases. °SEEK IMMEDIATE MEDICAL CARE IF:  °· Your fingers or toes are numb or blue. °· The pain is not responding to medications and continues to stay the same or get worse. °· The pain in your joint becomes severe. °· You develop a fever over 102° F (38.9° C). °· It becomes impossible to move or use the joint. °MAKE SURE YOU:  °·   Understand these instructions.  Will watch your condition.  Will get help right away if you are not doing well or get worse. Document Released: 06/21/2005 Document Revised: 09/13/2011 Document Reviewed: 02/07/2008 [facility] Patient Information 2015 [place], [place]. This information is not intended to replace advice given to you by your health care provider. Make sure you discuss any questions you have with your health care  provider.  Cryotherapy Cryotherapy means treatment with cold. Ice or gel packs can be used to reduce both pain and swelling. Ice is the most helpful within the first 24 to 48 hours after an injury or flare-up from overusing a muscle or joint. Sprains, strains, spasms, burning pain, shooting pain, and aches can all be eased with ice. Ice can also be used when recovering from surgery. Ice is effective, has very few side effects, and is safe for most people to use. PRECAUTIONS  Ice is not a safe treatment option for people with:  Raynaud phenomenon. This is a condition affecting small blood vessels in the extremities. Exposure to cold may cause your problems to return.  Cold hypersensitivity. There are many forms of cold hypersensitivity, including:  Cold urticaria. Red, itchy hives appear on the skin when the tissues begin to warm after being iced.  Cold erythema. This is a red, itchy rash caused by exposure to cold.  Cold hemoglobinuria. Red blood cells break down when the tissues begin to warm after being iced. The hemoglobin that carry oxygen are passed into the urine because they cannot combine with blood proteins fast enough.  Numbness or altered sensitivity in the area being iced. If you have any of the following conditions, do not use ice until you have discussed cryotherapy with your caregiver:  Heart conditions, such as arrhythmia, angina, or chronic heart disease.  High blood pressure.  Healing wounds or open skin in the area being iced.  Current infections.  Rheumatoid arthritis.  Poor circulation.  Diabetes. Ice slows the blood flow in the region it is applied. This is beneficial when trying to stop inflamed tissues from spreading irritating chemicals to surrounding tissues. However, if you expose your skin to cold temperatures for too long or without the proper protection, you can damage your skin or nerves. Watch for signs of skin damage due to cold. HOME CARE  INSTRUCTIONS Follow these tips to use ice and cold packs safely.  Place a dry or damp towel between the ice and skin. A damp towel will cool the skin more quickly, so you may need to shorten the time that the ice is used.  For a more rapid response, add gentle compression to the ice.  Ice for no more than 10 to 20 minutes at a time. The bonier the area you are icing, the less time it will take to get the benefits of ice.  Check your skin after 5 minutes to make sure there are no signs of a poor response to cold or skin damage.  Rest 20 minutes or more between uses.  Once your skin is numb, you can end your treatment. You can test numbness by very lightly touching your skin. The touch should be so light that you do not see the skin dimple from the pressure of your fingertip. When using ice, most people will feel these normal sensations in this order: cold, burning, aching, and numbness.  Do not use ice on someone who cannot communicate their responses to pain, such as small children or people with dementia. HOW TO MAKE  AN ICE PACK Ice packs are the most common way to use ice therapy. Other methods include ice massage, ice baths, and cryosprays. Muscle creams that cause a cold, tingly feeling do not offer the same benefits that ice offers and should not be used as a substitute unless recommended by your caregiver. To make an ice pack, do one of the following:  Place crushed ice or a bag of frozen vegetables in a sealable plastic bag. Squeeze out the excess air. Place this bag inside another plastic bag. Slide the bag into a pillowcase or place a damp towel between your skin and the bag.  Mix 3 parts water with 1 part rubbing alcohol. Freeze the mixture in a sealable plastic bag. When you remove the mixture from the freezer, it will be slushy. Squeeze out the excess air. Place this bag inside another plastic bag. Slide the bag into a pillowcase or place a damp towel between your skin and the  bag. SEEK MEDICAL CARE IF:  You develop white spots on your skin. This may give the skin a blotchy (mottled) appearance.  Your skin turns blue or pale.  Your skin becomes waxy or hard.  Your swelling gets worse. MAKE SURE YOU:   Understand these instructions.  Will watch your condition.  Will get help right away if you are not doing well or get worse. Document Released: 02/15/2011 Document Revised: 11/05/2013 Document Reviewed: 02/15/2011 [facility] Patient Information 2015 [place], [place]. This information is not intended to replace advice given to you by your health care provider. Make sure you discuss any questions you have with your health care provider.

## 2014-03-16 NOTE — ED Notes (Signed)
The patient said her knee has been swollen, red and hot for a week.  She said she was walking up stairs and she heard something "pop" and she felt pain. The patient rates her pain 10/10.

## 2014-03-16 NOTE — ED Provider Notes (Signed)
CSN: [id number]     Arrival date & time 03/16/14  2106 History   First MD Initiated Contact with Patient 03/16/14 2220     Chief Complaint  Patient presents with  . Knee Pain    The patient said her knee has been swollen, red and hot for a week.  She said she was walking up stairs and she heard something "pop" and she felt pain.  . Joint Swelling     (Consider location/radiation/quality/duration/timing/severity/associated sxs/prior Treatment) HPI Comments: Patient presents to the ED with a chief complaint of left knee pain.  She states that the pain started 1 week ago.  She states that she was walking up the stairs and felt her knee pop.  She has been able to ambulate since, but states that it is painful.  She reports swelling and redness.  She denies any fever.  She has not taken anything to alleviate her symptoms.  She is well known to this ED.  The history is provided by the patient. No language interpreter was used.    Past Medical History  Diagnosis Date  . Pancreatitis   . S/P laparoscopic cholecystectomy   . Fibroids   . Ovarian cyst   . Chronic abdominal pain   . Anemia    Past Surgical History  Procedure Laterality Date  . Cholecystectomy    . Dilation and curettage of uterus    . Ercp w/ metal stent placement    . Oophorectomy     Family History  Problem Relation Age of Onset  . Diabetes Mother   . Hypertension Mother   . Hypertension Father   . Diabetes Father   . Asthma Daughter    History  Substance Use Topics  . Smoking status: Current Every Day Smoker -- 0.25 packs/day for 10 years    Types: Cigarettes    Last Attempt to Quit: 09/01/2012  . Smokeless tobacco: Never Used  . Alcohol Use: No   OB History   Grav Para Term Preterm Abortions TAB SAB Ect Mult Living   4 2 1 1 2  2         Review of Systems  Constitutional: Negative for fever and chills.  Respiratory: Negative for shortness of breath.   Cardiovascular: Negative for chest pain.   Gastrointestinal: Negative for nausea, vomiting, diarrhea and constipation.  Genitourinary: Negative for dysuria.  Musculoskeletal: Positive for arthralgias.      Allergies  Celecoxib; Coconut flavor; Fentanyl; Ibuprofen; Ketorolac tromethamine; Meperidine hcl; Morphine; Ondansetron; Other; Shellfish allergy; Tramadol; Propoxycaine; Protonix; Robitussin (alcohol free); Aspirin; Ketamine; Propoxyphene n-acetaminophen; and Tylenol  Home Medications   Prior to Admission medications   Medication Sig Start Date End Date Taking? Authorizing Provider  estradiol (ESTRACE) 2 MG tablet Take 1 tablet (2 mg total) by mouth at bedtime. 09/10/13   Reyne Dumas, MD  metoCLOPramide (REGLAN) 10 MG tablet Take 1 tablet (10 mg total) by mouth every 6 (six) hours as needed for nausea. 11/20/13   Lance Bosch, NP  promethazine (PHENERGAN) 25 MG suppository Place 1 suppository (25 mg total) rectally every 6 (six) hours as needed for nausea or vomiting. 07/28/13   Elyn Peers, MD  traMADol (ULTRAM) 50 MG tablet Take 1 tablet (50 mg total) by mouth every 12 (twelve) hours as needed for severe pain. 09/10/13   Tresa Garter, MD  traMADol (ULTRAM) 50 MG tablet Take 1 tablet (50 mg total) by mouth 2 (two) times daily. Do not take more than twice daily. 12/19/13  Lance Bosch, NP   BP 121/85  Pulse 80  Temp(Src) 97.7 F (36.5 C) (Oral)  Resp 16  SpO2 98%  LMP 03/07/2014 Physical Exam  Nursing note and vitals reviewed. Constitutional: She is oriented to person, place, and time. She appears well-developed and well-nourished.  HENT:  Head: Normocephalic and atraumatic.  Eyes: Conjunctivae and EOM are normal.  Neck: Normal range of motion.  Cardiovascular: Normal rate.   Pulmonary/Chest: Effort normal.  Abdominal: She exhibits no distension.  Musculoskeletal: Normal range of motion.  Left knee non-tender to palpation, no bony abnormality or deformity, no erythema, no warmth, no evidence of DVT or septic  joint, ROM and strength is 5/5  Neurological: She is alert and oriented to person, place, and time.  Skin: Skin is dry.  Psychiatric: She has a normal mood and affect. Her behavior is normal. Judgment and thought content normal.    ED Course  Procedures (including critical care time) Labs Review Labs Reviewed - No data to display  Imaging Review No results found.   EKG Interpretation None      MDM   Final diagnoses:  Left knee pain    Left knee not tender to palpation, no evidence of septic joint, DVT, or cellulitis.  Care plan reviewed.  Patient has been seen multiple times in this ED.  She has a history of drug seeking behavior.  Will no indication for plain film based on [place] rules.  Will give knee sleeve and recommend ortho follow-up.    Montine Circle, PA-C 03/16/14 2307

## 2014-03-17 NOTE — ED Provider Notes (Signed)
Medical screening examination/treatment/procedure(s) were performed by non-physician practitioner and as supervising physician I was immediately available for consultation/collaboration.   EKG Interpretation None        Debby Freiberg, MD 03/17/14 1649

## 2014-03-21 ENCOUNTER — Encounter (HOSPITAL_COMMUNITY): Payer: Self-pay | Admitting: *Deleted

## 2014-03-21 ENCOUNTER — Inpatient Hospital Stay (HOSPITAL_COMMUNITY)
Admission: AD | Admit: 2014-03-21 | Discharge: 2014-03-21 | Disposition: A | Discharge status: Home / Self Care | Payer: Medicaid Other | Source: Ambulatory Visit | Attending: Obstetrics and Gynecology | Admitting: Obstetrics and Gynecology

## 2014-03-21 DIAGNOSIS — F489 Nonpsychotic mental disorder, unspecified: Secondary | ICD-10-CM | POA: Diagnosis not present

## 2014-03-21 DIAGNOSIS — F17200 Nicotine dependence, unspecified, uncomplicated: Secondary | ICD-10-CM | POA: Diagnosis not present

## 2014-03-21 DIAGNOSIS — F99 Mental disorder, not otherwise specified: Secondary | ICD-10-CM

## 2014-03-21 DIAGNOSIS — G8929 Other chronic pain: Secondary | ICD-10-CM | POA: Insufficient documentation

## 2014-03-21 LAB — URINE MICROSCOPIC-ADD ON

## 2014-03-21 LAB — URINALYSIS, ROUTINE W REFLEX MICROSCOPIC
BILIRUBIN URINE: NEGATIVE
GLUCOSE, UA: NEGATIVE mg/dL
Hgb urine dipstick: NEGATIVE
Ketones, ur: NEGATIVE mg/dL
Nitrite: NEGATIVE
Protein, ur: NEGATIVE mg/dL
Specific Gravity, Urine: 1.01 (ref 1.005–1.030)
UROBILINOGEN UA: 0.2 mg/dL (ref 0.0–1.0)
pH: 8.5 — ABNORMAL HIGH (ref 5.0–8.0)

## 2014-03-21 LAB — RAPID URINE DRUG SCREEN, HOSP PERFORMED
AMPHETAMINES: NOT DETECTED
BARBITURATES: NOT DETECTED
Benzodiazepines: NOT DETECTED
Cocaine: NOT DETECTED
OPIATES: NOT DETECTED
Tetrahydrocannabinol: POSITIVE — AB

## 2014-03-21 LAB — POCT PREGNANCY, URINE: Preg Test, Ur: POSITIVE — AB

## 2014-03-21 LAB — HCG, QUANTITATIVE, PREGNANCY: hCG, Beta Chain, Quant, S: 1 m[IU]/mL (ref <5)

## 2014-03-21 NOTE — MAU Note (Signed)
Pt states she has not had a hysterectomy.

## 2014-03-21 NOTE — Discharge Instructions (Signed)
The purpose of this group is to help patients identify strategies for coping with mental health crisis.  Group discusses possible causes of crisis and ways to manage them effectively.Chronic Pain Chronic pain can be defined as pain that is off and on and lasts for 3-6 months or longer. Many things cause chronic pain, which can make it difficult to make a diagnosis. There are many treatment options available for chronic pain. However, finding a treatment that works well for you may require trying various approaches until the right one is found. Many people benefit from a combination of two or more types of treatment to control their pain. SYMPTOMS  Chronic pain can occur anywhere in the body and can range from mild to very severe. Some types of chronic pain include:  Headache.  Low back pain.  Cancer pain.  Arthritis pain.  Neurogenic pain. This is pain resulting from damage to nerves. People with chronic pain may also have other symptoms such as:  Depression.  Anger.  Insomnia.  Anxiety. DIAGNOSIS  Your health care provider will help diagnose your condition over time. In many cases, the initial focus will be on excluding possible conditions that could be causing the pain. Depending on your symptoms, your health care provider may order tests to diagnose your condition. Some of these tests may include:   Blood tests.   CT scan.   MRI.   X-rays.   Ultrasounds.   Nerve conduction studies.  You may need to see a specialist.  TREATMENT  Finding treatment that works well may take time. You may be referred to a pain specialist. He or she may prescribe medicine or therapies, such as:   Mindful meditation or yoga.  Shots (injections) of numbing or pain-relieving medicines into the spine or area of pain.  Local electrical stimulation.  Acupuncture.   Massage therapy.   Aroma, color, light, or sound therapy.   Biofeedback.   Working with a physical therapist to  keep from getting stiff.   Regular, gentle exercise.   Cognitive or behavioral therapy.   Group support.  Sometimes, surgery may be recommended.  HOME CARE INSTRUCTIONS   Take all medicines as directed by your health care provider.   Lessen stress in your life by relaxing and doing things such as listening to calming music.   Exercise or be active as directed by your health care provider.   Eat a healthy diet and include things such as vegetables, fruits, fish, and lean meats in your diet.   Keep all follow-up appointments with your health care provider.   Attend a support group with others suffering from chronic pain. SEEK MEDICAL CARE IF:   Your pain gets worse.   You develop a new pain that was not there before.   You cannot tolerate medicines given to you by your health care provider.   You have new symptoms since your last visit with your health care provider.  SEEK IMMEDIATE MEDICAL CARE IF:   You feel weak.   You have decreased sensation or numbness.   You lose control of bowel or bladder function.   Your pain suddenly gets much worse.   You develop shaking.  You develop chills.  You develop confusion.  You develop chest pain.  You develop shortness of breath.  MAKE SURE YOU:  Understand these instructions.  Will watch your condition.  Will get help right away if you are not doing well or get worse. Document Released: 03/13/2002 Document Revised: 02/21/2013 Document  Reviewed: 12/15/2012 [employer] Patient Information 2015 [place], [place]. This information is not intended to replace advice given to you by your health care provider. Make sure you discuss any questions you have with your health care provider.

## 2014-03-21 NOTE — MAU Note (Signed)
Pt informed that she could not be pregnant because she has had a hysterectomy. Pt states that she is pregnant and that her doctor gave prenatal vitamins.

## 2014-03-21 NOTE — MAU Note (Signed)
Pt states she went to her MD and was told she was pregnant in March or May. Pt states  She found out she was pregnant 12/21/2013 Pt states that is the date on her Vitamins. Pt states she was lying on couch and she felt a gush of water. Pt states she is having "a lot of pain"

## 2014-03-21 NOTE — MAU Provider Note (Signed)
History     CSN: [id number]  Arrival date and time: 03/21/14 0045   None     Chief Complaint  Patient presents with  . Rupture of Membranes   HPI Comments: [name] [age] presents to MAU with a cup of urine that she states is hers and she says she is pregnant. She states she has been to the [facility] for her prenatal care and has Prenatal Vitamins. She has chronic pain. Her problem list include Total Abdominal Hysterectomy     Past Medical History  Diagnosis Date  . Pancreatitis   . S/P laparoscopic cholecystectomy   . Fibroids   . Ovarian cyst   . Chronic abdominal pain   . Anemia     Past Surgical History  Procedure Laterality Date  . Cholecystectomy    . Dilation and curettage of uterus    . Ercp w/ metal stent placement    . Oophorectomy      Family History  Problem Relation Age of Onset  . Diabetes Mother   . Hypertension Mother   . Hypertension Father   . Diabetes Father   . Asthma Daughter     History  Substance Use Topics  . Smoking status: Current Every Day Smoker -- 0.25 packs/day for 10 years    Types: Cigarettes    Last Attempt to Quit: 09/01/2012  . Smokeless tobacco: Never Used  . Alcohol Use: No    Allergies:  Allergies  Allergen Reactions  . Celecoxib Anaphylaxis and Swelling  . Coconut Flavor Shortness Of Breath and Swelling  . Fentanyl Anaphylaxis and Rash  . Ibuprofen Anaphylaxis and Rash  . Ketorolac Tromethamine Anaphylaxis  . Meperidine Hcl Anaphylaxis  . Morphine Anaphylaxis and Rash  . Ondansetron Anaphylaxis  . Other Shortness Of Breath and Swelling    GI cocktail  . Shellfish Allergy Anaphylaxis    Crab and lobster  . Tramadol Rash  . Propoxycaine Other (See Comments)    Throat swelling  . Protonix [Pantoprazole Sodium]     Throat Swelling.   . Robitussin (Alcohol Free) [Guaifenesin] Itching  . Aspirin Rash  . Ketamine Itching  . Propoxyphene N-Acetaminophen Rash  . Tylenol [Acetaminophen] Hives,  Itching and Rash    Prescriptions prior to admission  Medication Sig Dispense Refill  . promethazine (PHENERGAN) 25 MG suppository Place 1 suppository (25 mg total) rectally every 6 (six) hours as needed for nausea or vomiting.  12 each  0  . estradiol (ESTRACE) 2 MG tablet Take 1 tablet (2 mg total) by mouth at bedtime.  30 tablet  2  . metoCLOPramide (REGLAN) 10 MG tablet Take 1 tablet (10 mg total) by mouth every 6 (six) hours as needed for nausea.  30 tablet  1  . traMADol (ULTRAM) 50 MG tablet Take 1 tablet (50 mg total) by mouth every 12 (twelve) hours as needed for severe pain.  90 tablet  0  . traMADol (ULTRAM) 50 MG tablet Take 1 tablet (50 mg total) by mouth 2 (two) times daily. Do not take more than twice daily.  60 tablet  0    Review of Systems  Constitutional: Negative.   HENT: Negative.   Eyes: Negative.   Cardiovascular: Negative.   Gastrointestinal: Positive for abdominal pain.  Genitourinary: Negative.   Skin: Negative.   Neurological: Negative.   Psychiatric/Behavioral: Negative.    Physical Exam   Blood pressure 121/85, pulse 79, temperature 98.2 F (36.8 C), temperature source Oral, resp. rate 16, last menstrual  period 03/07/2014.  Physical Exam  Constitutional: She is oriented to person, place, and time. She appears well-developed and well-nourished.  HENT:  Head: Normocephalic and atraumatic.  Eyes: EOM are normal. Pupils are equal, round, and reactive to light.  Musculoskeletal: Normal range of motion.  Neurological: She is alert and oriented to person, place, and time.  Skin: Skin is warm.  Psychiatric: She has a normal mood and affect. Her behavior is normal. Judgment and thought content normal.   Results for orders placed during the hospital encounter of 03/21/14 (from the past 24 hour(s))  URINALYSIS, ROUTINE W REFLEX MICROSCOPIC     Status: Abnormal   Collection Time    03/21/14  1:05 AM      Result Value Ref Range   Color, Urine YELLOW  YELLOW    APPearance CLOUDY (*) CLEAR   Specific Gravity, Urine 1.010  1.005 - 1.030   pH 8.5 (*) 5.0 - 8.0   Glucose, UA NEGATIVE  NEGATIVE mg/dL   Hgb urine dipstick NEGATIVE  NEGATIVE   Bilirubin Urine NEGATIVE  NEGATIVE   Ketones, ur NEGATIVE  NEGATIVE mg/dL   Protein, ur NEGATIVE  NEGATIVE mg/dL   Urobilinogen, UA 0.2  0.0 - 1.0 mg/dL   Nitrite NEGATIVE  NEGATIVE   Leukocytes, UA TRACE (*) NEGATIVE  URINE MICROSCOPIC-ADD ON     Status: Abnormal   Collection Time    03/21/14  1:05 AM      Result Value Ref Range   Squamous Epithelial / LPF FEW (*) RARE   WBC, UA 3-6  <3 WBC/hpf   RBC / HPF 0-2  <3 RBC/hpf   Bacteria, UA MANY (*) RARE   Crystals CALCIUM PHOSPHATE CRYSTALS (*) NEGATIVE   Urine-Other AMORPHOUS URATES/PHOSPHATES    POCT PREGNANCY, URINE     Status: Abnormal   Collection Time    03/21/14  1:35 AM      Result Value Ref Range   Preg Test, Ur POSITIVE (*) NEGATIVE  HCG, QUANTITATIVE, PREGNANCY     Status: None   Collection Time    03/21/14  2:00 AM      Result Value Ref Range   hCG, Beta Chain, Quant, S 1  <5 mIU/mL    MAU Course  Procedures  MDM  UDS done/ results not available  Assessment and Plan   A: Chronic Pain Mental Illness  P: Advised she was not pregnant Advised she would not get pain medications  Georgia Duff 03/21/2014, 3:09 AM

## 2014-03-26 NOTE — MAU Provider Note (Signed)
Attestation of Attending Supervision of Advanced Practitioner: Evaluation and management procedures were performed by the PA/NP/CNM/OB Fellow under my supervision/collaboration. Chart reviewed and agree with management and plan.  Armie Moren V 03/26/2014 10:41 PM

## 2014-05-06 ENCOUNTER — Encounter (HOSPITAL_COMMUNITY): Payer: Self-pay | Admitting: *Deleted

## 2014-05-13 ENCOUNTER — Emergency Department (HOSPITAL_COMMUNITY)
Admission: EM | Admit: 2014-05-13 | Discharge: 2014-05-14 | Disposition: A | Discharge status: Home / Self Care | Payer: Medicaid Other | Attending: Emergency Medicine | Admitting: Emergency Medicine

## 2014-05-13 ENCOUNTER — Encounter (HOSPITAL_COMMUNITY): Payer: Self-pay | Admitting: Emergency Medicine

## 2014-05-13 DIAGNOSIS — Z862 Personal history of diseases of the blood and blood-forming organs and certain disorders involving the immune mechanism: Secondary | ICD-10-CM | POA: Diagnosis not present

## 2014-05-13 DIAGNOSIS — Z8719 Personal history of other diseases of the digestive system: Secondary | ICD-10-CM | POA: Insufficient documentation

## 2014-05-13 DIAGNOSIS — Z720 Tobacco use: Secondary | ICD-10-CM | POA: Diagnosis not present

## 2014-05-13 DIAGNOSIS — Z9049 Acquired absence of other specified parts of digestive tract: Secondary | ICD-10-CM | POA: Diagnosis not present

## 2014-05-13 DIAGNOSIS — Z8742 Personal history of other diseases of the female genital tract: Secondary | ICD-10-CM | POA: Insufficient documentation

## 2014-05-13 DIAGNOSIS — M549 Dorsalgia, unspecified: Secondary | ICD-10-CM | POA: Insufficient documentation

## 2014-05-13 DIAGNOSIS — R197 Diarrhea, unspecified: Secondary | ICD-10-CM | POA: Diagnosis not present

## 2014-05-13 DIAGNOSIS — R1013 Epigastric pain: Secondary | ICD-10-CM | POA: Insufficient documentation

## 2014-05-13 DIAGNOSIS — G8929 Other chronic pain: Secondary | ICD-10-CM | POA: Insufficient documentation

## 2014-05-13 DIAGNOSIS — R112 Nausea with vomiting, unspecified: Secondary | ICD-10-CM | POA: Diagnosis not present

## 2014-05-13 DIAGNOSIS — Z79899 Other long term (current) drug therapy: Secondary | ICD-10-CM | POA: Diagnosis not present

## 2014-05-13 LAB — CBC WITH DIFFERENTIAL/PLATELET
Basophils Absolute: 0 10*3/uL (ref 0.0–0.1)
Basophils Relative: 1 % (ref 0–1)
EOS ABS: 0.4 10*3/uL (ref 0.0–0.7)
Eosinophils Relative: 7 % — ABNORMAL HIGH (ref 0–5)
HCT: 37.1 % (ref 36.0–46.0)
Hemoglobin: 12.5 g/dL (ref 12.0–15.0)
LYMPHS ABS: 2.8 10*3/uL (ref 0.7–4.0)
LYMPHS PCT: 43 % (ref 12–46)
MCH: 28 pg (ref 26.0–34.0)
MCHC: 33.7 g/dL (ref 30.0–36.0)
MCV: 83.2 fL (ref 78.0–100.0)
MONOS PCT: 9 % (ref 3–12)
Monocytes Absolute: 0.6 10*3/uL (ref 0.1–1.0)
NEUTROS ABS: 2.6 10*3/uL (ref 1.7–7.7)
Neutrophils Relative %: 40 % — ABNORMAL LOW (ref 43–77)
PLATELETS: 261 10*3/uL (ref 150–400)
RBC: 4.46 MIL/uL (ref 3.87–5.11)
RDW: 12.2 % (ref 11.5–15.5)
WBC: 6.4 10*3/uL (ref 4.0–10.5)

## 2014-05-13 LAB — LIPASE, BLOOD: Lipase: 72 U/L — ABNORMAL HIGH (ref 11–59)

## 2014-05-13 LAB — COMPREHENSIVE METABOLIC PANEL
ALK PHOS: 156 U/L — AB (ref 39–117)
ALT: 234 U/L — AB (ref 0–35)
ANION GAP: 16 — AB (ref 5–15)
AST: 82 U/L — ABNORMAL HIGH (ref 0–37)
Albumin: 3.7 g/dL (ref 3.5–5.2)
BUN: 19 mg/dL (ref 6–23)
CHLORIDE: 106 meq/L (ref 96–112)
CO2: 22 mEq/L (ref 19–32)
Calcium: 8.9 mg/dL (ref 8.4–10.5)
Creatinine, Ser: 0.66 mg/dL (ref 0.50–1.10)
GFR calc non Af Amer: >90 mL/min (ref >90)
GLUCOSE: 98 mg/dL (ref 70–99)
Potassium: 3.7 mEq/L (ref 3.7–5.3)
Sodium: 144 mEq/L (ref 137–147)
TOTAL PROTEIN: 7.6 g/dL (ref 6.0–8.3)

## 2014-05-13 MED ORDER — SODIUM CHLORIDE 0.9 % IV BOLUS (SEPSIS)
1000.0000 mL | Freq: Once | INTRAVENOUS | Status: AC
  Administered 2014-05-13: 1000 mL via INTRAVENOUS

## 2014-05-13 MED ORDER — METOCLOPRAMIDE HCL 5 MG/ML IJ SOLN
10.0000 mg | Freq: Once | INTRAMUSCULAR | Status: DC
  Filled 2014-05-13: qty 2

## 2014-05-13 NOTE — ED Notes (Signed)
Pt denies nausea.

## 2014-05-13 NOTE — ED Notes (Signed)
Pt. reports chronic upper abdominal pain with emesis and diarrhea for 2 weeks . Denies fever or chills.

## 2014-05-13 NOTE — ED Provider Notes (Signed)
CSN: [id number]     Arrival date & time 05/13/14  2106 History   First MD Initiated Contact with Patient 05/13/14 2239     Chief Complaint  Patient presents with  . Abdominal Pain  . Back Pain     (Consider location/radiation/quality/duration/timing/severity/associated sxs/prior Treatment) HPI Comments: 33 year old femalewith collocated abdominal history including laparoscopic cholecystectomy, recurrent pancreatitis, chronic abdominal pain, ERCP with stent placement and removal presents with epigastric pain with mild radiation the back similar to multiple previous chronic pancreatitis/abdominal pain presentations. Patient is currently not taking any pain meds outpatient due to significant allergies. Patient denies fevers or chills but has had recurrent vomiting and diarrhea for a couple weeks. No bleeding.  Patient is a 33 y.o. female presenting with abdominal pain and back pain. The history is provided by the patient.  Abdominal Pain Associated symptoms: diarrhea, nausea and vomiting   Associated symptoms: no chest pain, no chills, no dysuria, no fever and no shortness of breath   Back Pain Associated symptoms: abdominal pain   Associated symptoms: no chest pain, no dysuria, no fever and no headaches     Past Medical History  Diagnosis Date  . Pancreatitis   . S/P laparoscopic cholecystectomy   . Fibroids   . Ovarian cyst   . Chronic abdominal pain   . Anemia    Past Surgical History  Procedure Laterality Date  . Cholecystectomy    . Dilation and curettage of uterus    . Ercp w/ metal stent placement    . Oophorectomy     Family History  Problem Relation Age of Onset  . Diabetes Mother   . Hypertension Mother   . Hypertension Father   . Diabetes Father   . Asthma Daughter    History  Substance Use Topics  . Smoking status: Current Every Day Smoker -- 0.25 packs/day for 10 years    Types: Cigarettes    Last Attempt to Quit: 09/01/2012  . Smokeless tobacco: Never  Used  . Alcohol Use: No   OB History    Gravida Para Term Preterm AB TAB SAB Ectopic Multiple Living   4 2 1 1 2  2         Review of Systems  Constitutional: Negative for fever and chills.  HENT: Negative for congestion.   Eyes: Negative for visual disturbance.  Respiratory: Negative for shortness of breath.   Cardiovascular: Negative for chest pain.  Gastrointestinal: Positive for nausea, vomiting, abdominal pain and diarrhea.  Genitourinary: Negative for dysuria and flank pain.  Musculoskeletal: Positive for back pain. Negative for neck pain and neck stiffness.  Skin: Negative for rash.  Neurological: Negative for light-headedness and headaches.      Allergies  Celecoxib; Coconut flavor; Fentanyl; Ibuprofen; Ketorolac tromethamine; Meperidine hcl; Morphine; Ondansetron; Other; Shellfish allergy; Tramadol; Propoxycaine; Protonix; Robitussin (alcohol free); Aspirin; Ketamine; Propoxyphene n-acetaminophen; and Tylenol  Home Medications   Prior to Admission medications   Medication Sig Start Date End Date Taking? Authorizing Provider  estradiol (ESTRACE) 2 MG tablet Take 1 tablet (2 mg total) by mouth at bedtime. 09/10/13   Reyne Dumas, MD  metoCLOPramide (REGLAN) 10 MG tablet Take 1 tablet (10 mg total) by mouth every 6 (six) hours as needed for nausea. Patient not taking: Reported on 05/13/2014 11/20/13   Lance Bosch, NP  promethazine (PHENERGAN) 25 MG suppository Place 1 suppository (25 mg total) rectally every 6 (six) hours as needed for nausea or vomiting. Patient not taking: Reported on 05/13/2014 07/28/13  Elyn Peers, MD  traMADol (ULTRAM) 50 MG tablet Take 1 tablet (50 mg total) by mouth every 12 (twelve) hours as needed for severe pain. Patient not taking: Reported on 05/13/2014 09/10/13   Tresa Garter, MD  traMADol (ULTRAM) 50 MG tablet Take 1 tablet (50 mg total) by mouth 2 (two) times daily. Do not take more than twice daily. Patient not taking: Reported on  05/13/2014 12/19/13   Lance Bosch, NP   BP 117/76 mmHg  Pulse 83  Temp(Src) 98.3 F (36.8 C) (Oral)  Resp 20  SpO2 100%  LMP 03/07/2014 Physical Exam  Constitutional: She is oriented to person, place, and time. She appears well-developed and well-nourished.  HENT:  Head: Normocephalic and atraumatic.  Eyes: Conjunctivae are normal. Right eye exhibits no discharge. Left eye exhibits no discharge.  Neck: Normal range of motion. Neck supple. No tracheal deviation present.  Cardiovascular: Normal rate and regular rhythm.   Pulmonary/Chest: Effort normal and breath sounds normal.  Abdominal: Soft. She exhibits no distension. There is tenderness (mild epigastric). There is no guarding.  Musculoskeletal: She exhibits no edema.  Neurological: She is alert and oriented to person, place, and time.  Skin: Skin is warm. No rash noted.  Psychiatric: She has a normal mood and affect.  Nursing note and vitals reviewed.   ED Course  Procedures (including critical care time) Labs Review Labs Reviewed  CBC WITH DIFFERENTIAL - Abnormal; Notable for the following:    Neutrophils Relative % 40 (*)    Eosinophils Relative 7 (*)    All other components within normal limits  COMPREHENSIVE METABOLIC PANEL - Abnormal; Notable for the following:    AST 82 (*)    ALT 234 (*)    Alkaline Phosphatase 156 (*)    Total Bilirubin <0.2 (*)    Anion gap 16 (*)    All other components within normal limits  LIPASE, BLOOD - Abnormal; Notable for the following:    Lipase 72 (*)    All other components within normal limits  URINALYSIS, ROUTINE W REFLEX MICROSCOPIC    Imaging Review No results found.   EKG Interpretation None      MDM   Final diagnoses:  Abdominal pain, epigastric  Non-intractable vomiting with nausea, vomiting of unspecified type   Patient with recurrent chronic abdominal pain with similar presentation this evening. Patient well-appearing, no distress. We reviewed her medical  history, multiple imaging tests care plan together in the room and with this being similar and for 2 and half weeks I recommended close follow-up.  IV fluids given in the ER. Patient requesting narcotic pain medicines. I long discussion with her in regards to her having 1 primary doctor who treats her chronic pain. In asking to leave the emergency department.  Results and differential diagnosis were discussed with the patient/parent/guardian.   Medications  metoCLOPramide (REGLAN) injection 10 mg (0 mg Intravenous Hold 05/13/14 2335)  sodium chloride 0.9 % bolus 1,000 mL (1,000 mLs Intravenous New Bag/Given 05/13/14 2334)    Filed Vitals:   05/13/14 2120 05/14/14 0006  BP: 134/78 117/76  Pulse: 81 83  Temp: 98.3 F (36.8 C)   TempSrc: Oral   Resp: 14 20  SpO2: 100% 100%    Final diagnoses:  Abdominal pain, epigastric  Non-intractable vomiting with nausea, vomiting of unspecified type        Mariea Clonts, MD 05/14/14 [phone]

## 2014-05-13 NOTE — ED Notes (Signed)
Dr Zavitz at bedside  

## 2014-05-14 NOTE — Discharge Instructions (Signed)
If you were given medicines take as directed.  If you are on coumadin or contraceptives realize their levels and effectiveness is altered by many different medicines.  If you have any reaction (rash, tongues swelling, other) to the medicines stop taking and see a physician.   Please follow up as directed and return to the ER or see a physician for new or worsening symptoms.  Thank you. Filed Vitals:   05/13/14 2120 05/14/14 0006  BP: 134/78 117/76  Pulse: 81 83  Temp: 98.3 F (36.8 C)   TempSrc: Oral   Resp: 14 20  SpO2: 100% 100%

## 2014-05-14 NOTE — ED Notes (Signed)
Pt requesting pain medication.  Stating she wants the medicine that starts with a d.  Dr. Reather Converse made aware.  Stated that pt would not be receiving any narcotic pain medication.

## 2014-05-14 NOTE — ED Notes (Signed)
The patient is unable to give an urine specimen at this time. The tech has reported to the RN in charge. 

## 2014-05-14 NOTE — ED Notes (Signed)
Pt requesting IV to be taken out and to leave AMA. MD made aware

## 2014-05-14 NOTE — ED Notes (Signed)
Pt called out x 2 for removal of IV and to leave. Pt sts "I can go home and suffer." IV removed. Pt ambulatory to discharge

## 2014-09-06 ENCOUNTER — Emergency Department (HOSPITAL_COMMUNITY)
Admission: EM | Admit: 2014-09-06 | Discharge: 2014-09-06 | Disposition: A | Discharge status: Home / Self Care | Payer: Medicaid Other | Attending: Emergency Medicine | Admitting: Emergency Medicine

## 2014-09-06 ENCOUNTER — Encounter (HOSPITAL_COMMUNITY): Payer: Self-pay | Admitting: Emergency Medicine

## 2014-09-06 DIAGNOSIS — Z8742 Personal history of other diseases of the female genital tract: Secondary | ICD-10-CM | POA: Insufficient documentation

## 2014-09-06 DIAGNOSIS — R1013 Epigastric pain: Secondary | ICD-10-CM | POA: Insufficient documentation

## 2014-09-06 DIAGNOSIS — Z720 Tobacco use: Secondary | ICD-10-CM | POA: Insufficient documentation

## 2014-09-06 DIAGNOSIS — Z862 Personal history of diseases of the blood and blood-forming organs and certain disorders involving the immune mechanism: Secondary | ICD-10-CM | POA: Diagnosis not present

## 2014-09-06 DIAGNOSIS — R109 Unspecified abdominal pain: Secondary | ICD-10-CM | POA: Diagnosis present

## 2014-09-06 DIAGNOSIS — R112 Nausea with vomiting, unspecified: Secondary | ICD-10-CM | POA: Insufficient documentation

## 2014-09-06 DIAGNOSIS — Z9049 Acquired absence of other specified parts of digestive tract: Secondary | ICD-10-CM | POA: Diagnosis not present

## 2014-09-06 DIAGNOSIS — Z9889 Other specified postprocedural states: Secondary | ICD-10-CM | POA: Insufficient documentation

## 2014-09-06 DIAGNOSIS — Z8719 Personal history of other diseases of the digestive system: Secondary | ICD-10-CM | POA: Insufficient documentation

## 2014-09-06 DIAGNOSIS — R110 Nausea: Secondary | ICD-10-CM

## 2014-09-06 DIAGNOSIS — Z793 Long term (current) use of hormonal contraceptives: Secondary | ICD-10-CM | POA: Diagnosis not present

## 2014-09-06 DIAGNOSIS — G8929 Other chronic pain: Secondary | ICD-10-CM

## 2014-09-06 LAB — CBC WITH DIFFERENTIAL/PLATELET
Basophils Absolute: 0 10*3/uL (ref 0.0–0.1)
Basophils Relative: 1 % (ref 0–1)
Eosinophils Absolute: 0.4 10*3/uL (ref 0.0–0.7)
Eosinophils Relative: 6 % — ABNORMAL HIGH (ref 0–5)
HCT: 35.3 % — ABNORMAL LOW (ref 36.0–46.0)
Hemoglobin: 11.9 g/dL — ABNORMAL LOW (ref 12.0–15.0)
LYMPHS ABS: 3.4 10*3/uL (ref 0.7–4.0)
LYMPHS PCT: 52 % — AB (ref 12–46)
MCH: 27.9 pg (ref 26.0–34.0)
MCHC: 33.7 g/dL (ref 30.0–36.0)
MCV: 82.7 fL (ref 78.0–100.0)
MONOS PCT: 6 % (ref 3–12)
Monocytes Absolute: 0.4 10*3/uL (ref 0.1–1.0)
NEUTROS PCT: 35 % — AB (ref 43–77)
Neutro Abs: 2.3 10*3/uL (ref 1.7–7.7)
Platelets: 252 10*3/uL (ref 150–400)
RBC: 4.27 MIL/uL (ref 3.87–5.11)
RDW: 11.6 % (ref 11.5–15.5)
WBC: 6.5 10*3/uL (ref 4.0–10.5)

## 2014-09-06 LAB — COMPREHENSIVE METABOLIC PANEL
ALBUMIN: 4.2 g/dL (ref 3.5–5.2)
ALK PHOS: 148 U/L — AB (ref 39–117)
ALT: 359 U/L — ABNORMAL HIGH (ref 0–35)
AST: 239 U/L — ABNORMAL HIGH (ref 0–37)
Anion gap: 8 (ref 5–15)
BILIRUBIN TOTAL: 0.3 mg/dL (ref 0.3–1.2)
BUN: 15 mg/dL (ref 6–23)
CHLORIDE: 106 mmol/L (ref 96–112)
CO2: 26 mmol/L (ref 19–32)
Calcium: 9.1 mg/dL (ref 8.4–10.5)
Creatinine, Ser: 0.72 mg/dL (ref 0.50–1.10)
GFR calc Af Amer: >90 mL/min (ref >90)
GFR calc non Af Amer: >90 mL/min (ref >90)
Glucose, Bld: 92 mg/dL (ref 70–99)
Potassium: 3.8 mmol/L (ref 3.5–5.1)
Sodium: 140 mmol/L (ref 135–145)
Total Protein: 7.1 g/dL (ref 6.0–8.3)

## 2014-09-06 LAB — LIPASE, BLOOD: LIPASE: 31 U/L (ref 11–59)

## 2014-09-06 MED ORDER — ONDANSETRON HCL 4 MG/2ML IJ SOLN
4.0000 mg | Freq: Once | INTRAMUSCULAR | Status: AC
  Administered 2014-09-06: 4 mg via INTRAVENOUS
  Filled 2014-09-06: qty 2

## 2014-09-06 MED ORDER — HYDROMORPHONE HCL 1 MG/ML IJ SOLN
1.0000 mg | Freq: Once | INTRAMUSCULAR | Status: AC
  Administered 2014-09-06: 1 mg via INTRAVENOUS
  Filled 2014-09-06: qty 1

## 2014-09-06 MED ORDER — SODIUM CHLORIDE 0.9 % IV BOLUS (SEPSIS)
1000.0000 mL | Freq: Once | INTRAVENOUS | Status: AC
  Administered 2014-09-06: 1000 mL via INTRAVENOUS

## 2014-09-06 MED ORDER — DIPHENHYDRAMINE HCL 50 MG/ML IJ SOLN
25.0000 mg | Freq: Once | INTRAMUSCULAR | Status: AC
  Administered 2014-09-06: 25 mg via INTRAVENOUS
  Filled 2014-09-06: qty 1

## 2014-09-06 NOTE — ED Provider Notes (Signed)
CSN: [id number]     Arrival date & time 09/06/14  0035 History   First MD Initiated Contact with Patient 09/06/14 0041     Chief Complaint  Patient presents with  . Abdominal Pain     (Consider location/radiation/quality/duration/timing/severity/associated sxs/prior Treatment) HPI Comments: Patient is a 34 year old female with a past medical history of pancreatitis and chronic abdominal pain who presents with abdominal pain that started 2 weeks ago. The pain is located in the epigastrium and does not radiate. The pain is described as aching and severe. The pain started gradually and progressively worsened since the onset. No alleviating/aggravating factors. The patient has tried nothing for symptoms without relief. Associated symptoms include nausea and vomiting. Patient denies fever, headache, NVD, chest pain, SOB, dysuria, constipation, abnormal vaginal bleeding/discharge.      Past Medical History  Diagnosis Date  . Pancreatitis   . S/P laparoscopic cholecystectomy   . Fibroids   . Ovarian cyst   . Chronic abdominal pain   . Anemia    Past Surgical History  Procedure Laterality Date  . Cholecystectomy    . Dilation and curettage of uterus    . Ercp w/ metal stent placement    . Oophorectomy     Family History  Problem Relation Age of Onset  . Diabetes Mother   . Hypertension Mother   . Hypertension Father   . Diabetes Father   . Asthma Daughter    History  Substance Use Topics  . Smoking status: Current Every Day Smoker -- 0.25 packs/day for 10 years    Types: Cigarettes    Last Attempt to Quit: 09/01/2012  . Smokeless tobacco: Never Used  . Alcohol Use: No   OB History    Gravida Para Term Preterm AB TAB SAB Ectopic Multiple Living   4 2 1 1 2  2         Review of Systems  Constitutional: Negative for fever, chills and fatigue.  HENT: Negative for trouble swallowing.   Eyes: Negative for visual disturbance.  Respiratory: Negative for shortness of breath.    Cardiovascular: Negative for chest pain and palpitations.  Gastrointestinal: Positive for nausea, vomiting and abdominal pain. Negative for diarrhea.  Genitourinary: Negative for dysuria and difficulty urinating.  Musculoskeletal: Negative for arthralgias and neck pain.  Skin: Negative for color change.  Neurological: Negative for dizziness and weakness.  Psychiatric/Behavioral: Negative for dysphoric mood.      Allergies  Celecoxib; Coconut flavor; Fentanyl; Ibuprofen; Ketorolac tromethamine; Meperidine hcl; Morphine; Ondansetron; Other; Shellfish allergy; Tramadol; Propoxycaine; Protonix; Robitussin (alcohol free); Aspirin; Ketamine; Propoxyphene n-acetaminophen; and Tylenol  Home Medications   Prior to Admission medications   Medication Sig Start Date End Date Taking? Authorizing Provider  estradiol (ESTRACE) 2 MG tablet Take 1 tablet (2 mg total) by mouth at bedtime. 09/10/13   Reyne Dumas, MD  metoCLOPramide (REGLAN) 10 MG tablet Take 1 tablet (10 mg total) by mouth every 6 (six) hours as needed for nausea. Patient not taking: Reported on 05/13/2014 11/20/13   Lance Bosch, NP  promethazine (PHENERGAN) 25 MG suppository Place 1 suppository (25 mg total) rectally every 6 (six) hours as needed for nausea or vomiting. Patient not taking: Reported on 05/13/2014 07/28/13   Elyn Peers, MD  traMADol (ULTRAM) 50 MG tablet Take 1 tablet (50 mg total) by mouth every 12 (twelve) hours as needed for severe pain. Patient not taking: Reported on 05/13/2014 09/10/13   Tresa Garter, MD  traMADol (ULTRAM) 50 MG  tablet Take 1 tablet (50 mg total) by mouth 2 (two) times daily. Do not take more than twice daily. Patient not taking: Reported on 05/13/2014 12/19/13   Lance Bosch, NP   BP 138/92 mmHg  Pulse 74  Temp(Src) 98.2 F (36.8 C)  Resp 17  Ht 5\' 5"  (1.651 m)  Wt 140 lb (63.504 kg)  BMI 23.30 kg/m2  SpO2 98%  LMP 03/07/2014 Physical Exam  Constitutional: She is oriented to person,  place, and time. She appears well-developed and well-nourished. No distress.  HENT:  Head: Normocephalic and atraumatic.  Eyes: Conjunctivae and EOM are normal.  Neck: Normal range of motion.  Cardiovascular: Normal rate and regular rhythm.  Exam reveals no gallop and no friction rub.   No murmur heard. Pulmonary/Chest: Effort normal and breath sounds normal. She has no wheezes. She has no rales. She exhibits no tenderness.  Abdominal: Soft. She exhibits no distension. There is tenderness. There is no rebound.  Mild epigastric tenderness to palpation. No other focal tenderness or peritoneal signs.   Musculoskeletal: Normal range of motion.  Neurological: She is alert and oriented to person, place, and time. Coordination normal.  Speech is goal-oriented. Moves limbs without ataxia.   Skin: Skin is warm and dry.  Psychiatric: She has a normal mood and affect. Her behavior is normal.  Nursing note and vitals reviewed.   ED Course  Procedures (including critical care time) Labs Review Labs Reviewed  CBC WITH DIFFERENTIAL/PLATELET - Abnormal; Notable for the following:    Hemoglobin 11.9 (*)    HCT 35.3 (*)    Neutrophils Relative % 35 (*)    Lymphocytes Relative 52 (*)    Eosinophils Relative 6 (*)    All other components within normal limits  COMPREHENSIVE METABOLIC PANEL - Abnormal; Notable for the following:    AST 239 (*)    ALT 359 (*)    Alkaline Phosphatase 148 (*)    All other components within normal limits  LIPASE, BLOOD  URINALYSIS, ROUTINE W REFLEX MICROSCOPIC  PREGNANCY, URINE    Imaging Review No results found.   EKG Interpretation None      MDM   Final diagnoses:  Abdominal pain, chronic, epigastric  Nausea    1:37 AM Labs unremarkable for acute changes. Patient will have fluids, dilaudid, and zofran for symptoms. Vitals stable and patient afebrile.   3:08 AM Patient has mildly elevated LFTs which seems to be recurrent. Patient reports improvement  of her symptoms since being in the ED. Patient will have recommended PCP follow up. Vitals stable and patient afebrile. Patient instructed to return with worsening or concerning symptoms. Plan discussed with Dr. Claudine Mouton who agrees with plan.    Alvina Chou, PA-C 09/06/14 0310  Everlene Balls, MD 09/06/14 1526

## 2014-09-06 NOTE — ED Notes (Signed)
Pt states that she is still itching.

## 2014-09-06 NOTE — ED Notes (Signed)
Pt presents with upper abdominal pain for the past 2 weeks- admits pain is similar to pancreatitis pain in the past.  Admits to N/VD.

## 2014-09-06 NOTE — Discharge Instructions (Signed)
Follow up with your doctor for further evaluation. Return to the ED with worsening or concerning symptoms. Refer to attached documents for more information.

## 2014-10-05 ENCOUNTER — Other Ambulatory Visit: Payer: Self-pay | Admitting: Internal Medicine

## 2014-11-04 ENCOUNTER — Emergency Department (HOSPITAL_COMMUNITY): Payer: Medicaid Other

## 2014-11-04 ENCOUNTER — Encounter (HOSPITAL_COMMUNITY): Payer: Self-pay

## 2014-11-04 ENCOUNTER — Emergency Department (HOSPITAL_COMMUNITY)
Admission: EM | Admit: 2014-11-04 | Discharge: 2014-11-05 | Disposition: A | Discharge status: Home / Self Care | Payer: Medicaid Other | Attending: Emergency Medicine | Admitting: Emergency Medicine

## 2014-11-04 DIAGNOSIS — Z862 Personal history of diseases of the blood and blood-forming organs and certain disorders involving the immune mechanism: Secondary | ICD-10-CM | POA: Insufficient documentation

## 2014-11-04 DIAGNOSIS — Z8719 Personal history of other diseases of the digestive system: Secondary | ICD-10-CM | POA: Insufficient documentation

## 2014-11-04 DIAGNOSIS — R1012 Left upper quadrant pain: Secondary | ICD-10-CM | POA: Diagnosis not present

## 2014-11-04 DIAGNOSIS — Z79899 Other long term (current) drug therapy: Secondary | ICD-10-CM | POA: Insufficient documentation

## 2014-11-04 DIAGNOSIS — J069 Acute upper respiratory infection, unspecified: Secondary | ICD-10-CM | POA: Insufficient documentation

## 2014-11-04 DIAGNOSIS — Z9049 Acquired absence of other specified parts of digestive tract: Secondary | ICD-10-CM | POA: Insufficient documentation

## 2014-11-04 DIAGNOSIS — R109 Unspecified abdominal pain: Secondary | ICD-10-CM | POA: Diagnosis present

## 2014-11-04 DIAGNOSIS — Z720 Tobacco use: Secondary | ICD-10-CM | POA: Diagnosis not present

## 2014-11-04 DIAGNOSIS — R1010 Upper abdominal pain, unspecified: Secondary | ICD-10-CM

## 2014-11-04 DIAGNOSIS — R197 Diarrhea, unspecified: Secondary | ICD-10-CM | POA: Diagnosis not present

## 2014-11-04 DIAGNOSIS — Z8742 Personal history of other diseases of the female genital tract: Secondary | ICD-10-CM | POA: Diagnosis not present

## 2014-11-04 LAB — URINALYSIS, ROUTINE W REFLEX MICROSCOPIC
BILIRUBIN URINE: NEGATIVE
Glucose, UA: NEGATIVE mg/dL
HGB URINE DIPSTICK: NEGATIVE
Ketones, ur: 15 mg/dL — AB
Nitrite: NEGATIVE
PROTEIN: NEGATIVE mg/dL
Specific Gravity, Urine: 1.03 (ref 1.005–1.030)
Urobilinogen, UA: 0.2 mg/dL (ref 0.0–1.0)
pH: 5 (ref 5.0–8.0)

## 2014-11-04 LAB — CBC WITH DIFFERENTIAL/PLATELET
BASOS ABS: 0 10*3/uL (ref 0.0–0.1)
Basophils Relative: 1 % (ref 0–1)
EOS ABS: 0 10*3/uL (ref 0.0–0.7)
Eosinophils Relative: 1 % (ref 0–5)
HCT: 41.9 % (ref 36.0–46.0)
Hemoglobin: 14.2 g/dL (ref 12.0–15.0)
Lymphocytes Relative: 34 % (ref 12–46)
Lymphs Abs: 1.2 10*3/uL (ref 0.7–4.0)
MCH: 27.8 pg (ref 26.0–34.0)
MCHC: 33.9 g/dL (ref 30.0–36.0)
MCV: 82 fL (ref 78.0–100.0)
MONOS PCT: 16 % — AB (ref 3–12)
Monocytes Absolute: 0.6 10*3/uL (ref 0.1–1.0)
Neutro Abs: 1.7 10*3/uL (ref 1.7–7.7)
Neutrophils Relative %: 48 % (ref 43–77)
Platelets: 204 10*3/uL (ref 150–400)
RBC: 5.11 MIL/uL (ref 3.87–5.11)
RDW: 12 % (ref 11.5–15.5)
WBC: 3.5 10*3/uL — AB (ref 4.0–10.5)

## 2014-11-04 LAB — COMPREHENSIVE METABOLIC PANEL
ALK PHOS: 200 U/L — AB (ref 38–126)
ALT: 174 U/L — ABNORMAL HIGH (ref 14–54)
AST: 66 U/L — ABNORMAL HIGH (ref 15–41)
Albumin: 3.9 g/dL (ref 3.5–5.0)
Anion gap: 11 (ref 5–15)
BILIRUBIN TOTAL: 0.6 mg/dL (ref 0.3–1.2)
BUN: 13 mg/dL (ref 6–20)
CHLORIDE: 106 mmol/L (ref 101–111)
CO2: 23 mmol/L (ref 22–32)
CREATININE: 0.62 mg/dL (ref 0.44–1.00)
Calcium: 9.1 mg/dL (ref 8.9–10.3)
GFR calc Af Amer: >60 mL/min (ref >60)
Glucose, Bld: 91 mg/dL (ref 70–99)
POTASSIUM: 3.8 mmol/L (ref 3.5–5.1)
Sodium: 140 mmol/L (ref 135–145)
Total Protein: 7.5 g/dL (ref 6.5–8.1)

## 2014-11-04 LAB — URINE MICROSCOPIC-ADD ON

## 2014-11-04 LAB — LIPASE, BLOOD: LIPASE: 25 U/L (ref 22–51)

## 2014-11-04 MED ORDER — METOCLOPRAMIDE HCL 10 MG PO TABS: 10.0000 mg | ORAL_TABLET | Freq: Four times a day (QID) | ORAL | Status: DC

## 2014-11-04 MED ORDER — METOCLOPRAMIDE HCL 5 MG/ML IJ SOLN: 10.0000 mg | Freq: Once | INTRAMUSCULAR | Status: DC

## 2014-11-04 MED ORDER — METOCLOPRAMIDE HCL 5 MG/ML IJ SOLN
10.0000 mg | Freq: Once | INTRAMUSCULAR | Status: DC
  Filled 2014-11-04: qty 2

## 2014-11-04 MED ORDER — TRAMADOL HCL 50 MG PO TABS: 100.0000 mg | ORAL_TABLET | Freq: Once | ORAL | Status: DC

## 2014-11-04 MED ORDER — DIPHENHYDRAMINE HCL 25 MG PO CAPS: 25.0000 mg | ORAL_CAPSULE | Freq: Once | ORAL | Status: DC

## 2014-11-04 NOTE — ED Notes (Signed)
Pt. Having  Upper resp symptoms cough, congestion and fever.  She reports her pancreatitis started last Monday and she is vomiting , nauseated and severe pain.  GCS 15, Skin is warm and dry.

## 2014-11-04 NOTE — ED Notes (Addendum)
Pt states not going to stay because "y'all aren't doing anything to help me with this pain."  This nurse explained the ordered reglan should help with nausea and pain.  Pt refused med.  Sharen Counter, PA, made aware.

## 2014-11-04 NOTE — ED Provider Notes (Signed)
CSN: [id number]     Arrival date & time 11/04/14  1240 History   First MD Initiated Contact with Patient 11/04/14 1652     Chief Complaint  Patient presents with  . URI     (Consider location/radiation/quality/duration/timing/severity/associated sxs/prior Treatment) HPI [name] is a 34 y.o. female with history of pancreatitis, chronic abdominal pain, presents to emergency department complaining of abdominal pain, nausea, vomiting, flulike symptoms. Patient states that her cold symptoms started 3 days ago. She states that her daughter was sick with the same and now states her boyfriend is sick with the same symptoms. She states her daughters are better. She reports runny nose, sore throat, nonproductive cough. She has been taking over-the-counter cough medication with no relief. She also has tried to take some losengers with no improvement in her symptoms. She states abdominal pain started 7 days ago. She admits to persistent nausea, vomiting. States she can't keep anything down. She has not eaten anything in 7 days. She also reports diarrhea. Pain is a left upper quadrant, feels like her prior pancreatitis. She has not seen her doctor for this. No medications tried prior to coming in.  Past Medical History  Diagnosis Date  . Pancreatitis   . S/P laparoscopic cholecystectomy   . Fibroids   . Ovarian cyst   . Chronic abdominal pain   . Anemia    Past Surgical History  Procedure Laterality Date  . Cholecystectomy    . Dilation and curettage of uterus    . Ercp w/ metal stent placement    . Oophorectomy     Family History  Problem Relation Age of Onset  . Diabetes Mother   . Hypertension Mother   . Hypertension Father   . Diabetes Father   . Asthma Daughter    History  Substance Use Topics  . Smoking status: Current Every Day Smoker -- 0.25 packs/day for 10 years    Types: Cigarettes    Last Attempt to Quit: 09/01/2012  . Smokeless tobacco: Never Used  . Alcohol  Use: No   OB History    Gravida Para Term Preterm AB TAB SAB Ectopic Multiple Living   4 2 1 1 2  2         Review of Systems  Constitutional: Positive for fever and chills.  HENT: Positive for congestion, sinus pressure and sore throat. Negative for ear pain and trouble swallowing.   Respiratory: Positive for cough. Negative for chest tightness and shortness of breath.   Cardiovascular: Negative.  Negative for chest pain, palpitations and leg swelling.  Gastrointestinal: Positive for nausea, vomiting, abdominal pain and diarrhea.  Genitourinary: Negative for dysuria and flank pain.  Musculoskeletal: Negative for myalgias, arthralgias, neck pain and neck stiffness.  Skin: Negative for rash.  Neurological: Negative for dizziness, weakness and headaches.  All other systems reviewed and are negative.     Allergies  Celecoxib; Coconut flavor; Fentanyl; Ibuprofen; Ketorolac tromethamine; Meperidine hcl; Morphine; Ondansetron; Other; Shellfish allergy; Tramadol; Propoxycaine; Protonix; Robitussin (alcohol free); Aspirin; Ketamine; Propoxyphene n-acetaminophen; and Tylenol  Home Medications   Prior to Admission medications   Medication Sig Start Date End Date Taking? Authorizing Provider  flintstones complete (FLINTSTONES) 60 MG chewable tablet Chew 2 tablets by mouth daily.   Yes Historical Provider, MD  estradiol (ESTRACE) 2 MG tablet Take 1 tablet (2 mg total) by mouth at bedtime. Patient not taking: Reported on 09/06/2014 09/10/13   Reyne Dumas, MD  metoCLOPramide (REGLAN) 10 MG tablet Take 1 tablet (  10 mg total) by mouth every 6 (six) hours as needed for nausea. Patient not taking: Reported on 05/13/2014 11/20/13   Lance Bosch, NP  promethazine (PHENERGAN) 25 MG suppository Place 1 suppository (25 mg total) rectally every 6 (six) hours as needed for nausea or vomiting. Patient not taking: Reported on 05/13/2014 07/28/13   Elyn Peers, MD  traMADol (ULTRAM) 50 MG tablet Take 1 tablet (50  mg total) by mouth every 12 (twelve) hours as needed for severe pain. Patient not taking: Reported on 05/13/2014 09/10/13   Tresa Garter, MD  traMADol (ULTRAM) 50 MG tablet Take 1 tablet (50 mg total) by mouth 2 (two) times daily. Do not take more than twice daily. Patient not taking: Reported on 05/13/2014 12/19/13   Lance Bosch, NP   BP 131/76 mmHg  Pulse 99  Temp(Src) 98.6 F (37 C) (Oral)  Resp 18  Ht 5\' 5"  (1.651 m)  Wt 139 lb 4 oz (63.163 kg)  BMI 23.17 kg/m2  SpO2 100%  LMP 03/07/2014 Physical Exam  Constitutional: She is oriented to person, place, and time. She appears well-developed and well-nourished. No distress.  HENT:  Head: Normocephalic and atraumatic.  Right Ear: Tympanic membrane, external ear and ear canal normal.  Left Ear: Tympanic membrane, external ear and ear canal normal.  Nose: Mucosal edema and rhinorrhea present.  Mouth/Throat: Uvula is midline, oropharynx is clear and moist and mucous membranes are normal.  Eyes: Conjunctivae are normal. Pupils are equal, round, and reactive to light.  Neck: Neck supple.  Cardiovascular: Normal rate, regular rhythm and normal heart sounds.   Pulmonary/Chest: Effort normal and breath sounds normal. No respiratory distress. She has no wheezes. She has no rales.  Abdominal: Soft. Bowel sounds are normal. She exhibits no distension. There is tenderness. There is no rebound.  LUQ tenderness  Musculoskeletal: She exhibits no edema.  Neurological: She is alert and oriented to person, place, and time.  Skin: Skin is warm and dry.  Psychiatric: She has a normal mood and affect. Her behavior is normal.  Nursing note and vitals reviewed.   ED Course  Procedures (including critical care time) Labs Review Labs Reviewed  CBC WITH DIFFERENTIAL/PLATELET - Abnormal; Notable for the following:    WBC 3.5 (*)    Monocytes Relative 16 (*)    All other components within normal limits  COMPREHENSIVE METABOLIC PANEL - Abnormal;  Notable for the following:    AST 66 (*)    ALT 174 (*)    Alkaline Phosphatase 200 (*)    All other components within normal limits  URINALYSIS, ROUTINE W REFLEX MICROSCOPIC - Abnormal; Notable for the following:    APPearance TURBID (*)    Ketones, ur 15 (*)    Leukocytes, UA SMALL (*)    All other components within normal limits  URINE MICROSCOPIC-ADD ON - Abnormal; Notable for the following:    Squamous Epithelial / LPF MANY (*)    Bacteria, UA MANY (*)    All other components within normal limits  LIPASE, BLOOD    Imaging Review Dg Abd Acute W/chest  11/04/2014   CLINICAL DATA:  Upper respiratory infection. LEFT lower quadrant pain. Nausea, vomiting, diarrhea for 2 days. Pancreatitis.  EXAM: DG ABDOMEN ACUTE W/ 1V CHEST  COMPARISON:  03/11/2013.  FINDINGS: There is no evidence of dilated bowel loops or free intraperitoneal air. No radiopaque calculi or other significant radiographic abnormality is seen. Heart size and mediastinal contours are within normal limits. Both  lungs are clear. Cholecystectomy clips are present in the right upper quadrant. Riedel's lobe of the liver again noted.  IMPRESSION: Negative abdominal radiographs.  No acute cardiopulmonary disease.   Electronically Signed   By: Dereck Ligas M.D.   On: 11/04/2014 19:10     EKG Interpretation None      MDM   Final diagnoses:  Pain of upper abdomen  URI (upper respiratory infection)    Patient with URI symptoms, daughter sick at home with the same. Also complaining about upper abdominal pain. Patient with history of chronic pain, states she has not had any issues with this pain in a while, presents with recurrent symptoms. She reports persistent nausea and vomiting at home, she appears to be in no distress here, abdomen is soft. Will order Reglan IM. Will hold off on giving her pain medications until get all the lab work back. I do not think she has a surgical abdomen. Her vital signs are normal.  9:42  PM Patient's URI symptoms, most likely viral nature. Over-the-counter medications. Patient's labs unremarkable except for elevated LFTs, history of the same. Lipase is normal. Discussed results with patient. Will give one tramadol emergency department, Benadryl for itching, she initially refused Reglan IM, however states she will take it now. Patient will need outpatient follow-up. This time I do not think she needs any further imaging or admission. Tolerating POs.   Filed Vitals:   11/04/14 1318 11/04/14 1930 11/04/14 1945  BP: 131/76 121/75   Pulse: 99  79  Temp: 98.6 F (37 C)    TempSrc: Oral    Resp: 18    Height: 5\' 5"  (1.651 m)    Weight: 139 lb 4 oz (63.163 kg)    SpO2: 100%  99%       Jeannett Senior, PA-C 11/04/14 2143  Alfonzo Beers, MD 11/04/14 2200

## 2014-11-04 NOTE — Discharge Instructions (Signed)
Please follow-up with [facility] community [facility] and wellness. Over-the-counter medications for her cold symptoms. Reglan for nausea. Return if symptoms are worsening.  Acute Pancreatitis Acute pancreatitis is a disease in which the pancreas becomes suddenly inflamed. The pancreas is a large gland located behind your stomach. The pancreas produces enzymes that help digest food. The pancreas also releases the hormones glucagon and insulin that help regulate blood sugar. Damage to the pancreas occurs when the digestive enzymes from the pancreas are activated and begin attacking the pancreas before being released into the intestine. Most acute attacks last a couple of days and can cause serious complications. Some people become dehydrated and develop low blood pressure. In severe cases, bleeding into the pancreas can lead to shock and can be life-threatening. The lungs, heart, and kidneys may fail. CAUSES  Pancreatitis can happen to anyone. In some cases, the cause is unknown. Most cases are caused by:  Alcohol abuse.  Gallstones. Other less common causes are:  Certain medicines.  Exposure to certain chemicals.  Infection.  Damage caused by an accident (trauma).  Abdominal surgery. SYMPTOMS   Pain in the upper abdomen that may radiate to the back.  Tenderness and swelling of the abdomen.  Nausea and vomiting. DIAGNOSIS  Your caregiver will perform a physical exam. Blood and stool tests may be done to confirm the diagnosis. Imaging tests may also be done, such as X-rays, CT scans, or an ultrasound of the abdomen. TREATMENT  Treatment usually requires a stay in the hospital. Treatment may include:  Pain medicine.  Fluid replacement through an intravenous line (IV).  Placing a tube in the stomach to remove stomach contents and control vomiting.  Not eating for 3 or 4 days. This gives your pancreas a rest, because enzymes are not being produced that can cause further  damage.  Antibiotic medicines if your condition is caused by an infection.  Surgery of the pancreas or gallbladder. HOME CARE INSTRUCTIONS   Follow the diet advised by your caregiver. This may involve avoiding alcohol and decreasing the amount of fat in your diet.  Eat smaller, more frequent meals. This reduces the amount of digestive juices the pancreas produces.  Drink enough fluids to keep your urine clear or pale yellow.  Only take over-the-counter or prescription medicines as directed by your caregiver.  Avoid drinking alcohol if it caused your condition.  Do not smoke.  Get plenty of rest.  Check your blood sugar at home as directed by your caregiver.  Keep all follow-up appointments as directed by your caregiver. SEEK MEDICAL CARE IF:   You do not recover as quickly as expected.  You develop new or worsening symptoms.  You have persistent pain, weakness, or nausea.  You recover and then have another episode of pain. SEEK IMMEDIATE MEDICAL CARE IF:   You are unable to eat or keep fluids down.  Your pain becomes severe.  You have a fever or persistent symptoms for more than 2 to 3 days.  You have a fever and your symptoms suddenly get worse.  Your skin or the white part of your eyes turn yellow (jaundice).  You develop vomiting.  You feel dizzy, or you faint.  Your blood sugar is high (over 300 mg/dL). MAKE SURE YOU:   Understand these instructions.  Will watch your condition.  Will get help right away if you are not doing well or get worse. Document Released: 06/21/2005 Document Revised: 12/21/2011 Document Reviewed: 09/30/2011 [facility] Patient Information 2015 [place], [place].  This information is not intended to replace advice given to you by your health care provider. Make sure you discuss any questions you have with your health care provider.  Upper Respiratory Infection, Adult An upper respiratory infection (URI) is also sometimes known as the  common cold. The upper respiratory tract includes the nose, sinuses, throat, trachea, and bronchi. Bronchi are the airways leading to the lungs. Most people improve within 1 week, but symptoms can last up to 2 weeks. A residual cough may last even longer.  CAUSES Many different viruses can infect the tissues lining the upper respiratory tract. The tissues become irritated and inflamed and often become very moist. Mucus production is also common. A cold is contagious. You can easily spread the virus to others by oral contact. This includes kissing, sharing a glass, coughing, or sneezing. Touching your mouth or nose and then touching a surface, which is then touched by another person, can also spread the virus. SYMPTOMS  Symptoms typically develop 1 to 3 days after you come in contact with a cold virus. Symptoms vary from person to person. They may include:  Runny nose.  Sneezing.  Nasal congestion.  Sinus irritation.  Sore throat.  Loss of voice (laryngitis).  Cough.  Fatigue.  Muscle aches.  Loss of appetite.  Headache.  Low-grade fever. DIAGNOSIS  You might diagnose your own cold based on familiar symptoms, since most people get a cold 2 to 3 times a year. Your caregiver can confirm this based on your exam. Most importantly, your caregiver can check that your symptoms are not due to another disease such as strep throat, sinusitis, pneumonia, asthma, or epiglottitis. Blood tests, throat tests, and X-rays are not necessary to diagnose a common cold, but they may sometimes be helpful in excluding other more serious diseases. Your caregiver will decide if any further tests are required. RISKS AND COMPLICATIONS  You may be at risk for a more severe case of the common cold if you smoke cigarettes, have chronic heart disease (such as heart failure) or lung disease (such as asthma), or if you have a weakened immune system. The very young and very old are also at risk for more serious  infections. Bacterial sinusitis, middle ear infections, and bacterial pneumonia can complicate the common cold. The common cold can worsen asthma and chronic obstructive pulmonary disease (COPD). Sometimes, these complications can require emergency medical care and may be life-threatening. PREVENTION  The best way to protect against getting a cold is to practice good hygiene. Avoid oral or hand contact with people with cold symptoms. Wash your hands often if contact occurs. There is no clear evidence that vitamin C, vitamin E, echinacea, or exercise reduces the chance of developing a cold. However, it is always recommended to get plenty of rest and practice good nutrition. TREATMENT  Treatment is directed at relieving symptoms. There is no cure. Antibiotics are not effective, because the infection is caused by a virus, not by bacteria. Treatment may include:  Increased fluid intake. Sports drinks offer valuable electrolytes, sugars, and fluids.  Breathing heated mist or steam (vaporizer or shower).  Eating chicken soup or other clear broths, and maintaining good nutrition.  Getting plenty of rest.  Using gargles or lozenges for comfort.  Controlling fevers with ibuprofen or acetaminophen as directed by your caregiver.  Increasing usage of your inhaler if you have asthma. Zinc gel and zinc lozenges, taken in the first 24 hours of the common cold, can shorten the duration and  lessen the severity of symptoms. Pain medicines may help with fever, muscle aches, and throat pain. A variety of non-prescription medicines are available to treat congestion and runny nose. Your caregiver can make recommendations and may suggest nasal or lung inhalers for other symptoms.  HOME CARE INSTRUCTIONS   Only take over-the-counter or prescription medicines for pain, discomfort, or fever as directed by your caregiver.  Use a warm mist humidifier or inhale steam from a shower to increase air moisture. This may keep  secretions moist and make it easier to breathe.  Drink enough water and fluids to keep your urine clear or pale yellow.  Rest as needed.  Return to work when your temperature has returned to normal or as your caregiver advises. You may need to stay home longer to avoid infecting others. You can also use a face mask and careful hand washing to prevent spread of the virus. SEEK MEDICAL CARE IF:   After the first few days, you feel you are getting worse rather than better.  You need your caregiver's advice about medicines to control symptoms.  You develop chills, worsening shortness of breath, or brown or red sputum. These may be signs of pneumonia.  You develop yellow or brown nasal discharge or pain in the face, especially when you bend forward. These may be signs of sinusitis.  You develop a fever, swollen neck glands, pain with swallowing, or white areas in the back of your throat. These may be signs of strep throat. SEEK IMMEDIATE MEDICAL CARE IF:   You have a fever.  You develop severe or persistent headache, ear pain, sinus pain, or chest pain.  You develop wheezing, a prolonged cough, cough up blood, or have a change in your usual mucus (if you have chronic lung disease).  You develop sore muscles or a stiff neck. Document Released: 12/15/2000 Document Revised: 09/13/2011 Document Reviewed: 09/26/2013 [facility] Patient Information 2015 [place], [place]. This information is not intended to replace advice given to you by your health care provider. Make sure you discuss any questions you have with your health care provider.

## 2014-11-14 ENCOUNTER — Encounter: Payer: Self-pay | Admitting: Family

## 2014-11-21 ENCOUNTER — Encounter: Payer: Self-pay | Admitting: Family

## 2014-12-10 ENCOUNTER — Encounter: Payer: Self-pay | Admitting: Obstetrics & Gynecology

## 2014-12-31 ENCOUNTER — Encounter: Payer: Self-pay | Admitting: General Practice

## 2015-01-01 ENCOUNTER — Encounter: Payer: Self-pay | Admitting: Family

## 2015-01-14 ENCOUNTER — Encounter (HOSPITAL_COMMUNITY): Payer: Self-pay | Admitting: *Deleted

## 2015-01-14 ENCOUNTER — Emergency Department (HOSPITAL_COMMUNITY)
Admission: EM | Admit: 2015-01-14 | Discharge: 2015-01-14 | Disposition: A | Discharge status: Home / Self Care | Payer: Medicaid Other | Attending: Emergency Medicine | Admitting: Emergency Medicine

## 2015-01-14 DIAGNOSIS — Z8719 Personal history of other diseases of the digestive system: Secondary | ICD-10-CM | POA: Insufficient documentation

## 2015-01-14 DIAGNOSIS — R1013 Epigastric pain: Secondary | ICD-10-CM | POA: Diagnosis not present

## 2015-01-14 DIAGNOSIS — Z862 Personal history of diseases of the blood and blood-forming organs and certain disorders involving the immune mechanism: Secondary | ICD-10-CM | POA: Insufficient documentation

## 2015-01-14 DIAGNOSIS — G8929 Other chronic pain: Secondary | ICD-10-CM | POA: Diagnosis not present

## 2015-01-14 DIAGNOSIS — Z720 Tobacco use: Secondary | ICD-10-CM | POA: Insufficient documentation

## 2015-01-14 DIAGNOSIS — Z8742 Personal history of other diseases of the female genital tract: Secondary | ICD-10-CM | POA: Diagnosis not present

## 2015-01-14 DIAGNOSIS — Z86018 Personal history of other benign neoplasm: Secondary | ICD-10-CM | POA: Diagnosis not present

## 2015-01-14 DIAGNOSIS — R197 Diarrhea, unspecified: Secondary | ICD-10-CM | POA: Insufficient documentation

## 2015-01-14 DIAGNOSIS — Z9049 Acquired absence of other specified parts of digestive tract: Secondary | ICD-10-CM | POA: Insufficient documentation

## 2015-01-14 DIAGNOSIS — R112 Nausea with vomiting, unspecified: Secondary | ICD-10-CM | POA: Diagnosis not present

## 2015-01-14 LAB — COMPREHENSIVE METABOLIC PANEL
ALT: 56 U/L — ABNORMAL HIGH (ref 14–54)
AST: 22 U/L (ref 15–41)
Albumin: 4.5 g/dL (ref 3.5–5.0)
Alkaline Phosphatase: 98 U/L (ref 38–126)
Anion gap: 11 (ref 5–15)
BUN: 13 mg/dL (ref 6–20)
CO2: 25 mmol/L (ref 22–32)
Calcium: 9.2 mg/dL (ref 8.9–10.3)
Chloride: 105 mmol/L (ref 101–111)
Creatinine, Ser: 0.8 mg/dL (ref 0.44–1.00)
GFR calc Af Amer: >60 mL/min (ref >60)
GFR calc non Af Amer: >60 mL/min (ref >60)
Glucose, Bld: 97 mg/dL (ref 65–99)
Potassium: 3.3 mmol/L — ABNORMAL LOW (ref 3.5–5.1)
Sodium: 141 mmol/L (ref 135–145)
Total Bilirubin: 0.6 mg/dL (ref 0.3–1.2)
Total Protein: 8.7 g/dL — ABNORMAL HIGH (ref 6.5–8.1)

## 2015-01-14 LAB — CBC
HEMATOCRIT: 39.7 % (ref 36.0–46.0)
Hemoglobin: 13.1 g/dL (ref 12.0–15.0)
MCH: 27.8 pg (ref 26.0–34.0)
MCHC: 33 g/dL (ref 30.0–36.0)
MCV: 84.1 fL (ref 78.0–100.0)
Platelets: 304 10*3/uL (ref 150–400)
RBC: 4.72 MIL/uL (ref 3.87–5.11)
RDW: 12.5 % (ref 11.5–15.5)
WBC: 9.9 10*3/uL (ref 4.0–10.5)

## 2015-01-14 LAB — LIPASE, BLOOD: Lipase: 23 U/L (ref 22–51)

## 2015-01-14 MED ORDER — METOCLOPRAMIDE HCL 5 MG/ML IJ SOLN
10.0000 mg | Freq: Once | INTRAMUSCULAR | Status: AC
  Administered 2015-01-14: 10 mg via INTRAVENOUS
  Filled 2015-01-14: qty 2

## 2015-01-14 MED ORDER — TRAMADOL HCL 50 MG PO TABS
100.0000 mg | ORAL_TABLET | Freq: Once | ORAL | Status: AC
  Administered 2015-01-14: 100 mg via ORAL
  Filled 2015-01-14: qty 2

## 2015-01-14 MED ORDER — DIPHENHYDRAMINE HCL 50 MG/ML IJ SOLN
25.0000 mg | Freq: Once | INTRAMUSCULAR | Status: AC
  Administered 2015-01-14: 25 mg via INTRAVENOUS
  Filled 2015-01-14: qty 1

## 2015-01-14 MED ORDER — SODIUM CHLORIDE 0.9 % IV BOLUS (SEPSIS)
1000.0000 mL | Freq: Once | INTRAVENOUS | Status: AC
  Administered 2015-01-14: 1000 mL via INTRAVENOUS

## 2015-01-14 MED ORDER — METOCLOPRAMIDE HCL 10 MG PO TABS: 10.0000 mg | ORAL_TABLET | Freq: Four times a day (QID) | ORAL | Status: DC | PRN

## 2015-01-14 NOTE — ED Notes (Signed)
Pt states she cannot give urine sample at this time, states having a pancreatitis flare up that has gotten worse over 3 weeks, has had n/v/d.

## 2015-01-14 NOTE — ED Provider Notes (Signed)
CSN: [id number]     Arrival date & time 01/14/15  0234 History   First MD Initiated Contact with Patient 01/14/15 [phone]     Chief Complaint  Patient presents with  . N/V/D      (Consider location/radiation/quality/duration/timing/severity/associated sxs/prior Treatment) HPI  This is a 34 year old female with a long-standing history of chronic episodic abdominal pain nausea, vomiting and diarrhea. 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 She is currently on Suboxone prescribed by a Dr. Nolon Rod. The patient states that she was on Suboxone because of her drug screens Showing positive for marijuana but that she "graduated" from Dr. Nolon Rod' "course" 3-1/2 months ago. She states she no longer sees Dr. Nolon Rod. She was unable to explain how she had gotten a prescription for Suboxone as recently as June 11.  She is here with 3 weeks of epigastric pain, nausea, vomiting and diarrhea. She presented this morning because her epigastric pain worsened and is now a 10 out of 10. Pain is worse with movement or palpation. It is worse with eating or drinking. She characterizes it as like previous pancreatitis pain. She denies respiratory symptoms or recent sick contacts.  Past Medical History  Diagnosis Date  . Pancreatitis   . S/P laparoscopic cholecystectomy   . Fibroids   . Ovarian cyst   . Chronic abdominal pain   . Anemia    Past Surgical History  Procedure Laterality Date  . Cholecystectomy    . Dilation and curettage of uterus    . Ercp w/ metal stent placement    . Oophorectomy     Family History  Problem Relation Age of Onset  . Diabetes Mother   . Hypertension Mother   . Hypertension Father   . Diabetes Father   . Asthma Daughter    History  Substance Use Topics  . Smoking status: Current Every Day Smoker -- 0.25 packs/day for 10 years    Types: Cigarettes    Last Attempt to Quit: 09/01/2012  . Smokeless  tobacco: Never Used  . Alcohol Use: No   OB History    Gravida Para Term Preterm AB TAB SAB Ectopic Multiple Living   4 2 1 1 2  2         Review of Systems  All other systems reviewed and are negative.   Allergies  Celecoxib; Coconut flavor; Fentanyl; Ibuprofen; Ketorolac tromethamine; Meperidine hcl; Morphine; Ondansetron; Other; Shellfish allergy; Tramadol; Propoxycaine; Protonix; Robitussin (alcohol free); Aspirin; Contrast media; Ketamine; Propoxyphene n-acetaminophen; and Tylenol  Home Medications   Prior to Admission medications   Medication Sig Start Date End Date Taking? Authorizing Provider  metoCLOPramide (REGLAN) 10 MG tablet Take 1 tablet (10 mg total) by mouth every 6 (six) hours. Patient not taking: Reported on 01/14/2015 11/04/14   Tatyana Kirichenko, PA-C   BP 138/92 mmHg  Pulse 80  Temp(Src) 98 F (36.7 C) (Oral)  Resp 16  SpO2 99%  LMP 03/07/2014   Physical Exam  General: Well-developed, well-nourished female in no acute distress or apparent discomfort; appearance consistent with age of record HENT: normocephalic; atraumatic Eyes: pupils equal, round and reactive to light; extraocular muscles intact Neck: supple Heart: regular rate and rhythm Lungs: clear to auscultation bilaterally Abdomen: soft; nondistended; epigastric tenderness; no masses or hepatosplenomegaly; bowel sounds present Extremities: No deformity; full range of motion; pulses normal Neurologic: Awake, alert and oriented; motor function intact in all extremities and symmetric; no facial droop Skin: Warm  and dry Psychiatric: Normal mood and affect    ED Course  Procedures (including critical care time)   MDM   Nursing notes and vitals signs, including pulse oximetry, reviewed.  Summary of this visit's results, reviewed by myself:  Labs:  Results for orders placed or performed during the hospital encounter of 01/14/15 (from the past 24 hour(s))  Lipase, blood     Status: None    Collection Time: 01/14/15  3:24 AM  Result Value Ref Range   Lipase 23 22 - 51 U/L  Comprehensive metabolic panel     Status: Abnormal   Collection Time: 01/14/15  3:24 AM  Result Value Ref Range   Sodium 141 135 - 145 mmol/L   Potassium 3.3 (L) 3.5 - 5.1 mmol/L   Chloride 105 101 - 111 mmol/L   CO2 25 22 - 32 mmol/L   Glucose, Bld 97 65 - 99 mg/dL   BUN 13 6 - 20 mg/dL   Creatinine, Ser 0.80 0.44 - 1.00 mg/dL   Calcium 9.2 8.9 - 10.3 mg/dL   Total Protein 8.7 (H) 6.5 - 8.1 g/dL   Albumin 4.5 3.5 - 5.0 g/dL   AST 22 15 - 41 U/L   ALT 56 (H) 14 - 54 U/L   Alkaline Phosphatase 98 38 - 126 U/L   Total Bilirubin 0.6 0.3 - 1.2 mg/dL   GFR calc non Af Amer >60 >60 mL/min   GFR calc Af Amer >60 >60 mL/min   Anion gap 11 5 - 15  CBC     Status: None   Collection Time: 01/14/15  3:24 AM  Result Value Ref Range   WBC 9.9 4.0 - 10.5 K/uL   RBC 4.72 3.87 - 5.11 MIL/uL   Hemoglobin 13.1 12.0 - 15.0 g/dL   HCT 39.7 36.0 - 46.0 %   MCV 84.1 78.0 - 100.0 fL   MCH 27.8 26.0 - 34.0 pg   MCHC 33.0 30.0 - 36.0 g/dL   RDW 12.5 11.5 - 15.5 %   Platelets 304 150 - 400 K/uL   *Note: Due to a large number of results and/or encounters for the requested time period, some results have not been displayed. A complete set of results can be found in Results Review.   4:57 AM Patient states her nausea is relieved and she is ready to go. She states she does not wish for prescription for pain medication but requests a single tramadol tablet here.    Shanon Rosser, MD 01/14/15 [phone]

## 2015-01-14 NOTE — ED Notes (Signed)
Pt reports abd pain with n/v x 3 weeks.  Has hx of pancreatitis.

## 2015-01-14 NOTE — ED Notes (Signed)
Pt states she is ready to go, EDP made aware

## 2015-01-22 ENCOUNTER — Emergency Department (HOSPITAL_COMMUNITY)
Admission: EM | Admit: 2015-01-22 | Discharge: 2015-01-23 | Disposition: A | Discharge status: Home / Self Care | Payer: Medicaid Other | Attending: Emergency Medicine | Admitting: Emergency Medicine

## 2015-01-22 ENCOUNTER — Encounter (HOSPITAL_COMMUNITY): Payer: Self-pay | Admitting: Emergency Medicine

## 2015-01-22 DIAGNOSIS — K130 Diseases of lips: Secondary | ICD-10-CM

## 2015-01-22 DIAGNOSIS — S00551A Superficial foreign body of lip, initial encounter: Secondary | ICD-10-CM | POA: Insufficient documentation

## 2015-01-22 DIAGNOSIS — X58XXXA Exposure to other specified factors, initial encounter: Secondary | ICD-10-CM | POA: Diagnosis not present

## 2015-01-22 DIAGNOSIS — Z86018 Personal history of other benign neoplasm: Secondary | ICD-10-CM | POA: Diagnosis not present

## 2015-01-22 DIAGNOSIS — K1379 Other lesions of oral mucosa: Secondary | ICD-10-CM | POA: Diagnosis present

## 2015-01-22 DIAGNOSIS — Z862 Personal history of diseases of the blood and blood-forming organs and certain disorders involving the immune mechanism: Secondary | ICD-10-CM | POA: Insufficient documentation

## 2015-01-22 DIAGNOSIS — Z8719 Personal history of other diseases of the digestive system: Secondary | ICD-10-CM | POA: Diagnosis not present

## 2015-01-22 DIAGNOSIS — Y998 Other external cause status: Secondary | ICD-10-CM | POA: Insufficient documentation

## 2015-01-22 DIAGNOSIS — M795 Residual foreign body in soft tissue: Secondary | ICD-10-CM

## 2015-01-22 DIAGNOSIS — Z8742 Personal history of other diseases of the female genital tract: Secondary | ICD-10-CM | POA: Insufficient documentation

## 2015-01-22 DIAGNOSIS — Z9889 Other specified postprocedural states: Secondary | ICD-10-CM | POA: Insufficient documentation

## 2015-01-22 DIAGNOSIS — Y9289 Other specified places as the place of occurrence of the external cause: Secondary | ICD-10-CM | POA: Insufficient documentation

## 2015-01-22 DIAGNOSIS — Z720 Tobacco use: Secondary | ICD-10-CM | POA: Insufficient documentation

## 2015-01-22 DIAGNOSIS — Y9389 Activity, other specified: Secondary | ICD-10-CM | POA: Insufficient documentation

## 2015-01-22 DIAGNOSIS — G8929 Other chronic pain: Secondary | ICD-10-CM | POA: Insufficient documentation

## 2015-01-22 MED ORDER — LIDOCAINE HCL 2 % EX GEL
1.0000 "application " | Freq: Once | CUTANEOUS | Status: AC
  Administered 2015-01-23: 1
  Filled 2015-01-22: qty 20

## 2015-01-22 NOTE — ED Notes (Signed)
Pt. reports lower lip pain at her piercing with mild swelling onset this week .

## 2015-01-22 NOTE — ED Provider Notes (Signed)
CSN: [id number]     Arrival date & time 01/22/15  2237 History  This chart was scribed for Delsa Grana, PA-C, working with Varney Biles, MD by Steva Colder, ED Scribe. The patient was seen in room TR08C/TR08C at 11:09 PM.     Chief Complaint  Patient presents with  . Oral Pain      The history is provided by the patient. No language interpreter was used.    HPI Comments: [name] is a 34 y.o. female who presents to the Emergency Department complaining of oral pain onset tonight. Pt reports that she is having lower lip pain with mild swelling where her lip piercing is located. Pt notes that when she drinks something she will hear a sizzling sound. Pt denies biting her cheek in the past or chancre sore. Pt just got this lip piercing 1 month ago and didn't remove it because it would close up on her. Pt notes that her previous lip ring has a flat portion on the back of it that broke while eating and she is not able to find the part afterwards. She states that she has tried tylenol with no relief for her symptoms. She denies fever, chills, trismus, and any other symptoms.   Past Medical History  Diagnosis Date  . Pancreatitis   . S/P laparoscopic cholecystectomy   . Fibroids   . Ovarian cyst   . Chronic abdominal pain   . Anemia    Past Surgical History  Procedure Laterality Date  . Cholecystectomy    . Dilation and curettage of uterus    . Ercp w/ metal stent placement    . Oophorectomy     Family History  Problem Relation Age of Onset  . Diabetes Mother   . Hypertension Mother   . Hypertension Father   . Diabetes Father   . Asthma Daughter    History  Substance Use Topics  . Smoking status: Current Every Day Smoker -- 0.25 packs/day for 0 years    Types: Cigarettes    Last Attempt to Quit: 09/01/2012  . Smokeless tobacco: Never Used  . Alcohol Use: No   OB History    Gravida Para Term Preterm AB TAB SAB Ectopic Multiple Living   4 2 1 1 2  2          Review of Systems  Constitutional: Negative for fever and chills.  HENT:       Lower lip pain  Skin: Negative for color change, rash and wound.      Allergies  Celecoxib; Coconut flavor; Fentanyl; Ibuprofen; Ketorolac tromethamine; Meperidine hcl; Morphine; Ondansetron; Other; Shellfish allergy; Tramadol; Propoxycaine; Protonix; Robitussin (alcohol free); Aspirin; Contrast media; Ketamine; Propoxyphene n-acetaminophen; and Tylenol  Home Medications   Prior to Admission medications   Medication Sig Start Date End Date Taking? Authorizing Provider  metoCLOPramide (REGLAN) 10 MG tablet Take 1 tablet (10 mg total) by mouth every 6 (six) hours as needed for nausea or vomiting. 01/14/15   John Molpus, MD   BP 121/67 mmHg  Pulse 75  Temp(Src) 98.1 F (36.7 C) (Oral)  Resp 18  Wt 141 lb 1.6 oz (64.003 kg)  SpO2 98%  LMP 03/07/2014 Physical Exam  Constitutional: She is oriented to person, place, and time. She appears well-developed and well-nourished. No distress.  HENT:  Head: Normocephalic and atraumatic.    Nose: Nose normal.  Mouth/Throat: Uvula is midline and oropharynx is clear and moist. Mucous membranes are not pale, not dry and not  cyanotic. No oral lesions. No uvula swelling or lacerations. No oropharyngeal exudate, posterior oropharyngeal edema, posterior oropharyngeal erythema or tonsillar abscesses.    Eyes: Conjunctivae and EOM are normal. Pupils are equal, round, and reactive to light. Right eye exhibits no discharge. Left eye exhibits no discharge. No scleral icterus.  Neck: Normal range of motion. No JVD present. No tracheal deviation present. No thyromegaly present.  Cardiovascular: Normal rate, regular rhythm, normal heart sounds and intact distal pulses.  Exam reveals no gallop and no friction rub.   No murmur heard. Pulmonary/Chest: Effort normal and breath sounds normal. No respiratory distress. She has no wheezes. She has no rales. She exhibits no tenderness.   Abdominal: Soft. She exhibits no distension.  Musculoskeletal: Normal range of motion. She exhibits no edema or tenderness.  Lymphadenopathy:    She has no cervical adenopathy.  Neurological: She is alert and oriented to person, place, and time. She has normal reflexes. No cranial nerve deficit. She exhibits normal muscle tone. Coordination normal.  Skin: Skin is warm and dry. No rash noted. She is not diaphoretic. No cyanosis or erythema. No pallor. Nails show no clubbing.  Psychiatric: She has a normal mood and affect. Her behavior is normal. Judgment and thought content normal.  Nursing note and vitals reviewed.     ED Course  Procedures (including critical care time) DIAGNOSTIC STUDIES: Oxygen Saturation is 98% on RA, nl by my interpretation.    COORDINATION OF CARE: 11:15 PM-Discussed treatment plan with pt at bedside and pt agreed to plan.   Labs Review Labs Reviewed - No data to display  Imaging Review No results found.   EKG Interpretation None      MDM   Final diagnoses:  None    Pt with gum pain near left lower lip piercing 2-55mm area of swelling adjacent to piercing Local lidocaine jelly applied to inside and outside of pierced area - pt removed piercing Wanted to make sure foreign body was not in oral mucosa, pt not tolerating palpation, obtained films - no foreign body, pt discharged home with antibiotics and lido gel  Pt asking for pain medicine, no visible swelling or redness, pt is afebrile, not tachycardic, pt has care plan, I have told the patient that she cannot get pain medicine due to her care plan.  I offered Tylenol and ibuprofen, which she states she is allergic to. She then asked for tramadol, which the chart says she has an allergy to, the patient refuted an allergy to tramadol. She was discharged home    I personally performed the services described in this documentation, which was scribed in my presence. The recorded information has been  reviewed and is accurate.    Delsa Grana, PA-C 01/24/15 1610  Varney Biles, MD 01/24/15 2216

## 2015-01-23 ENCOUNTER — Emergency Department (HOSPITAL_COMMUNITY): Payer: Medicaid Other

## 2015-01-23 MED ORDER — PENICILLIN V POTASSIUM 500 MG PO TABS: 500.0000 mg | ORAL_TABLET | Freq: Two times a day (BID) | ORAL | Status: DC

## 2015-01-23 MED ORDER — LIDOCAINE VISCOUS 2 % MT SOLN: 20.0000 mL | OROMUCOSAL | Status: DC | PRN

## 2015-01-23 NOTE — Discharge Instructions (Signed)

## 2015-01-23 NOTE — ED Notes (Signed)
Patient transported to X-ray 

## 2015-01-24 ENCOUNTER — Encounter (HOSPITAL_COMMUNITY): Payer: Self-pay | Admitting: Emergency Medicine

## 2015-01-24 ENCOUNTER — Other Ambulatory Visit (HOSPITAL_COMMUNITY)
Admission: RE | Admit: 2015-01-24 | Discharge: 2015-01-24 | Disposition: A | Discharge status: Home / Self Care | Payer: Medicaid Other | Source: Ambulatory Visit | Attending: Family Medicine | Admitting: Family Medicine

## 2015-01-24 ENCOUNTER — Emergency Department (INDEPENDENT_AMBULATORY_CARE_PROVIDER_SITE_OTHER)
Admission: EM | Admit: 2015-01-24 | Discharge: 2015-01-24 | Disposition: A | Discharge status: Home / Self Care | Payer: Medicaid Other | Source: Home / Self Care | Attending: Family Medicine | Admitting: Family Medicine

## 2015-01-24 DIAGNOSIS — N760 Acute vaginitis: Secondary | ICD-10-CM

## 2015-01-24 DIAGNOSIS — Z113 Encounter for screening for infections with a predominantly sexual mode of transmission: Secondary | ICD-10-CM | POA: Diagnosis not present

## 2015-01-24 DIAGNOSIS — N898 Other specified noninflammatory disorders of vagina: Secondary | ICD-10-CM | POA: Diagnosis not present

## 2015-01-24 LAB — POCT URINALYSIS DIP (DEVICE)
BILIRUBIN URINE: NEGATIVE
Glucose, UA: NEGATIVE mg/dL
Hgb urine dipstick: NEGATIVE
Ketones, ur: NEGATIVE mg/dL
Nitrite: NEGATIVE
Protein, ur: 30 mg/dL — AB
Specific Gravity, Urine: 1.025 (ref 1.005–1.030)
Urobilinogen, UA: 2 mg/dL — ABNORMAL HIGH (ref 0.0–1.0)
pH: 6.5 (ref 5.0–8.0)

## 2015-01-24 MED ORDER — METRONIDAZOLE 500 MG PO TABS: 500.0000 mg | ORAL_TABLET | Freq: Two times a day (BID) | ORAL | Status: DC

## 2015-01-24 MED ORDER — FLUCONAZOLE 150 MG PO TABS: ORAL_TABLET | ORAL | Status: DC

## 2015-01-24 NOTE — ED Notes (Signed)
Vaginal itching and burning.  History of uti, reports this does not feel like a uti.

## 2015-01-24 NOTE — Discharge Instructions (Signed)
Bacterial Vaginosis Bacterial vaginosis is an infection of the vagina. It happens when too many of certain germs (bacteria) grow in the vagina. HOME CARE  Take your medicine as told by your doctor.  Finish your medicine even if you start to feel better.  Do not have sex until you finish your medicine and are better.  Tell your sex partner that you have an infection. They should see their doctor for treatment.  Practice safe sex. Use condoms. Have only one sex partner. GET HELP IF:  You are not getting better after 3 days of treatment.  You have more grey fluid (discharge) coming from your vagina than before.  You have more pain than before.  You have a fever. MAKE SURE YOU:   Understand these instructions.  Will watch your condition.  Will get help right away if you are not doing well or get worse. Document Released: 03/30/2008 Document Revised: 04/11/2013 Document Reviewed: 01/31/2013 [facility] Patient Information 2015 [place], [place]. This information is not intended to replace advice given to you by your health care provider. Make sure you discuss any questions you have with your health care provider.  Vaginitis Vaginitis is an inflammation of the vagina. It is most often caused by a change in the normal balance of the bacteria and yeast that live in the vagina. This change in balance causes an overgrowth of certain bacteria or yeast, which causes the inflammation. There are different types of vaginitis, but the most common types are:  Bacterial vaginosis.  Yeast infection (candidiasis).  Trichomoniasis vaginitis. This is a sexually transmitted infection (STI).  Viral vaginitis.  Atropic vaginitis.  Allergic vaginitis. CAUSES  The cause depends on the type of vaginitis. Vaginitis can be caused by:  Bacteria (bacterial vaginosis).  Yeast (yeast infection).  A parasite (trichomoniasis vaginitis)  A virus (viral vaginitis).  Low hormone levels (atrophic  vaginitis). Low hormone levels can occur during pregnancy, breastfeeding, or after menopause.  Irritants, such as bubble baths, scented tampons, and feminine sprays (allergic vaginitis). Other factors can change the normal balance of the yeast and bacteria that live in the vagina. These include:  Antibiotic medicines.  Poor hygiene.  Diaphragms, vaginal sponges, spermicides, birth control pills, and intrauterine devices (IUD).  Sexual intercourse.  Infection.  Uncontrolled diabetes.  A weakened immune system. SYMPTOMS  Symptoms can vary depending on the cause of the vaginitis. Common symptoms include:  Abnormal vaginal discharge.  The discharge is white, gray, or yellow with bacterial vaginosis.  The discharge is thick, white, and cheesy with a yeast infection.  The discharge is frothy and yellow or greenish with trichomoniasis.  A bad vaginal odor.  The odor is fishy with bacterial vaginosis.  Vaginal itching, pain, or swelling.  Painful intercourse.  Pain or burning when urinating. Sometimes, there are no symptoms. TREATMENT  Treatment will vary depending on the type of infection.   Bacterial vaginosis and trichomoniasis are often treated with antibiotic creams or pills.  Yeast infections are often treated with antifungal medicines, such as vaginal creams or suppositories.  Viral vaginitis has no cure, but symptoms can be treated with medicines that relieve discomfort. Your sexual partner should be treated as well.  Atrophic vaginitis may be treated with an estrogen cream, pill, suppository, or vaginal ring. If vaginal dryness occurs, lubricants and moisturizing creams may help. You may be told to avoid scented soaps, sprays, or douches.  Allergic vaginitis treatment involves quitting the use of the product that is causing the problem. Vaginal creams  can be used to treat the symptoms. HOME CARE INSTRUCTIONS   Take all medicines as directed by your  caregiver.  Keep your genital area clean and dry. Avoid soap and only rinse the area with water.  Avoid douching. It can remove the healthy bacteria in the vagina.  Do not use tampons or have sexual intercourse until your vaginitis has been treated. Use sanitary pads while you have vaginitis.  Wipe from front to back. This avoids the spread of bacteria from the rectum to the vagina.  Let air reach your genital area.  Wear cotton underwear to decrease moisture buildup.  Avoid wearing underwear while you sleep until your vaginitis is gone.  Avoid tight pants and underwear or nylons without a cotton panel.  Take off wet clothing (especially bathing suits) as soon as possible.  Use mild, non-scented products. Avoid using irritants, such as:  Scented feminine sprays.  Fabric softeners.  Scented detergents.  Scented tampons.  Scented soaps or bubble baths.  Practice safe sex and use condoms. Condoms may prevent the spread of trichomoniasis and viral vaginitis. SEEK MEDICAL CARE IF:   You have abdominal pain.  You have a fever or persistent symptoms for more than 2-3 days.  You have a fever and your symptoms suddenly get worse. Document Released: 04/18/2007 Document Revised: 03/15/2012 Document Reviewed: 12/02/2011 [facility] Patient Information 2015 [place], [place]. This information is not intended to replace advice given to you by your health care provider. Make sure you discuss any questions you have with your health care provider.

## 2015-01-24 NOTE — ED Provider Notes (Signed)
CSN: [id number]     Arrival date & time 01/24/15  1528 History   First MD Initiated Contact with Patient 01/24/15 1558     Chief Complaint  Patient presents with  . Vaginal Itching   (Consider location/radiation/quality/duration/timing/severity/associated sxs/prior Treatment) HPI Comments: 34 year old female complaining of burning and itching to the vulvovaginal tissues for 3 days. She attempted to apply Monistat-type cream but that caused too much burning. She also complaining of a white vaginal discharge. Denies urinary symptoms She has a history of hysterectomy that includes the cervix.  Patient is a 34 y.o. female presenting with vaginal itching.  Vaginal Itching    Past Medical History  Diagnosis Date  . Pancreatitis   . S/P laparoscopic cholecystectomy   . Fibroids   . Ovarian cyst   . Chronic abdominal pain   . Anemia    Past Surgical History  Procedure Laterality Date  . Cholecystectomy    . Dilation and curettage of uterus    . Ercp w/ metal stent placement    . Oophorectomy     Family History  Problem Relation Age of Onset  . Diabetes Mother   . Hypertension Mother   . Hypertension Father   . Diabetes Father   . Asthma Daughter    History  Substance Use Topics  . Smoking status: Current Every Day Smoker -- 0.25 packs/day for 0 years    Types: Cigarettes    Last Attempt to Quit: 09/01/2012  . Smokeless tobacco: Never Used  . Alcohol Use: No   OB History    Gravida Para Term Preterm AB TAB SAB Ectopic Multiple Living   4 2 1 1 2  2         Review of Systems  Constitutional: Negative for fever, activity change and fatigue.  Respiratory: Negative.   Gastrointestinal: Negative.   Genitourinary: Positive for vaginal discharge, vaginal pain and pelvic pain. Negative for dysuria, urgency, frequency, hematuria and menstrual problem.  Neurological: Negative.     Allergies  Celecoxib; Coconut flavor; Fentanyl; Ibuprofen; Ketorolac tromethamine; Meperidine  hcl; Morphine; Ondansetron; Other; Shellfish allergy; Tramadol; Propoxycaine; Protonix; Robitussin (alcohol free); Aspirin; Contrast media; Ketamine; Propoxyphene n-acetaminophen; and Tylenol  Home Medications   Prior to Admission medications   Medication Sig Start Date End Date Taking? Authorizing Provider  fluconazole (DIFLUCAN) 150 MG tablet 1 tab po x 1. May repeat in 72 hours if no improvement 01/24/15   Janne Napoleon, NP  lidocaine (XYLOCAINE) 2 % solution Use as directed 20 mLs in the mouth or throat as needed for mouth pain. 01/23/15   Delsa Grana, PA-C  metoCLOPramide (REGLAN) 10 MG tablet Take 1 tablet (10 mg total) by mouth every 6 (six) hours as needed for nausea or vomiting. 01/14/15   Shanon Rosser, MD  metroNIDAZOLE (FLAGYL) 500 MG tablet Take 1 tablet (500 mg total) by mouth 2 (two) times daily. X 7 days 01/24/15   Janne Napoleon, NP  penicillin v potassium (VEETID) 500 MG tablet Take 1 tablet (500 mg total) by mouth 2 (two) times daily. 01/23/15   Delsa Grana, PA-C   BP 117/77 mmHg  Pulse 92  Temp(Src) 98.3 F (36.8 C) (Oral)  Resp 16  SpO2 98%  LMP 03/07/2014 Physical Exam  Constitutional: She is oriented to person, place, and time. She appears well-developed and well-nourished. No distress.  Neck: Normal range of motion. Neck supple.  Cardiovascular: Normal rate.   Pulmonary/Chest: Effort normal.  Genitourinary:  Normal external female genitalia. No lesions are visible. Minimal  erythema. Mucosal aspect of the external labia is tender. Speculum inserts normally without assistance. He was a copious amount of very thick white discharge coating the vaginal walls and accumulated in the proximal vagina.   Neurological: She is alert and oriented to person, place, and time.  Skin: Skin is warm and dry.  Psychiatric: She has a normal mood and affect.  Nursing note and vitals reviewed.   ED Course  Procedures (including critical care time) Labs Review Labs Reviewed  POCT URINALYSIS  DIP (DEVICE) - Abnormal; Notable for the following:    Protein, ur 30 (*)    Urobilinogen, UA 2.0 (*)    Leukocytes, UA MODERATE (*)    All other components within normal limits  CERVICOVAGINAL ANCILLARY ONLY    Imaging Review Dg Facial Bones 1-2 Views  01/23/2015   CLINICAL DATA:  Bottom lip piercing has broken off, at the left side. Assess for foreign body. Initial encounter.  EXAM: FACIAL BONES - 1-2 VIEW  COMPARISON:  None.  FINDINGS: The patient's upper lip and tongue metallic piercings are noted. No additional radiopaque foreign bodies are seen.  There is chronic absence of much of the dentition. Prevertebral soft tissues are within normal limits. The visualized paranasal sinuses are well aerated. No acute osseous abnormalities are seen.  IMPRESSION: Upper lip and tongue piercings noted. No additional radiopaque foreign bodies seen.   Electronically Signed   By: Garald Balding M.D.   On: 01/23/2015 01:36     MDM   1. Vaginitis   2. Vaginal discharge    Diflucan and flagyl F/U with your PCP Try OTC vagisil cerv cytology pending    Janne Napoleon, NP 01/24/15 1630

## 2015-01-26 ENCOUNTER — Encounter (HOSPITAL_COMMUNITY): Payer: Self-pay | Admitting: Emergency Medicine

## 2015-01-26 ENCOUNTER — Emergency Department (HOSPITAL_COMMUNITY)
Admission: EM | Admit: 2015-01-26 | Discharge: 2015-01-26 | Disposition: A | Discharge status: Home / Self Care | Payer: Medicaid Other | Attending: Emergency Medicine | Admitting: Emergency Medicine

## 2015-01-26 DIAGNOSIS — Z8719 Personal history of other diseases of the digestive system: Secondary | ICD-10-CM | POA: Diagnosis not present

## 2015-01-26 DIAGNOSIS — Z86018 Personal history of other benign neoplasm: Secondary | ICD-10-CM | POA: Insufficient documentation

## 2015-01-26 DIAGNOSIS — G8929 Other chronic pain: Secondary | ICD-10-CM | POA: Diagnosis not present

## 2015-01-26 DIAGNOSIS — Z792 Long term (current) use of antibiotics: Secondary | ICD-10-CM | POA: Insufficient documentation

## 2015-01-26 DIAGNOSIS — Z9889 Other specified postprocedural states: Secondary | ICD-10-CM | POA: Diagnosis not present

## 2015-01-26 DIAGNOSIS — Z862 Personal history of diseases of the blood and blood-forming organs and certain disorders involving the immune mechanism: Secondary | ICD-10-CM | POA: Insufficient documentation

## 2015-01-26 DIAGNOSIS — Z720 Tobacco use: Secondary | ICD-10-CM | POA: Diagnosis not present

## 2015-01-26 DIAGNOSIS — N898 Other specified noninflammatory disorders of vagina: Secondary | ICD-10-CM | POA: Diagnosis present

## 2015-01-26 MED ORDER — DIPHENHYDRAMINE HCL 25 MG PO CAPS
25.0000 mg | ORAL_CAPSULE | Freq: Once | ORAL | Status: AC
  Administered 2015-01-26: 25 mg via ORAL
  Filled 2015-01-26: qty 1

## 2015-01-26 MED ORDER — NYSTATIN-TRIAMCINOLONE 100000-0.1 UNIT/GM-% EX CREA: TOPICAL_CREAM | CUTANEOUS | Status: DC

## 2015-01-26 NOTE — Discharge Instructions (Signed)
Take Benadryl as needed for itching. Follow up at [facility] as needed for GYN problems.

## 2015-01-26 NOTE — ED Provider Notes (Signed)
CSN: [id number]     Arrival date & time 01/26/15  2141 History   First MD Initiated Contact with Patient 01/26/15 2231     Chief Complaint  Patient presents with  . Vaginitis     (Consider location/radiation/quality/duration/timing/severity/associated sxs/prior Treatment) HPI [name] is a 34 y.o. female with multiple medical problems and visits to ED's. She has a care plan. She presents to the ED with vaginal itching and discharge. She was evaluated for same at [facility] 7/22 and treated for yeast infection with Diflucan. Patient complains that she has had no improvement despite the medications. Cultures for GC and Chlamydia collected at time of [place] visit.   Past Medical History  Diagnosis Date  . Pancreatitis   . S/P laparoscopic cholecystectomy   . Fibroids   . Ovarian cyst   . Chronic abdominal pain   . Anemia    Past Surgical History  Procedure Laterality Date  . Cholecystectomy    . Dilation and curettage of uterus    . Ercp w/ metal stent placement    . Oophorectomy     Family History  Problem Relation Age of Onset  . Diabetes Mother   . Hypertension Mother   . Hypertension Father   . Diabetes Father   . Asthma Daughter    History  Substance Use Topics  . Smoking status: Current Every Day Smoker -- 0.25 packs/day for 0 years    Types: Cigarettes    Last Attempt to Quit: 09/01/2012  . Smokeless tobacco: Never Used  . Alcohol Use: No   OB History    Gravida Para Term Preterm AB TAB SAB Ectopic Multiple Living   4 2 1 1 2  2         Review of Systems  Genitourinary: Positive for vaginal discharge. Vaginal pain: itching.  all other systems negative    Allergies  Celecoxib; Coconut flavor; Fentanyl; Ibuprofen; Ketorolac tromethamine; Meperidine hcl; Morphine; Ondansetron; Other; Shellfish allergy; Tramadol; Propoxycaine; Protonix; Robitussin (alcohol free); Aspirin; Contrast media; Ketamine; Propoxyphene n-acetaminophen; and Tylenol  Home  Medications   Prior to Admission medications   Medication Sig Start Date End Date Taking? Authorizing Provider  fluconazole (DIFLUCAN) 150 MG tablet 1 tab po x 1. May repeat in 72 hours if no improvement 01/24/15   Janne Napoleon, NP  lidocaine (XYLOCAINE) 2 % solution Use as directed 20 mLs in the mouth or throat as needed for mouth pain. 01/23/15   Delsa Grana, PA-C  metoCLOPramide (REGLAN) 10 MG tablet Take 1 tablet (10 mg total) by mouth every 6 (six) hours as needed for nausea or vomiting. 01/14/15   Shanon Rosser, MD  metroNIDAZOLE (FLAGYL) 500 MG tablet Take 1 tablet (500 mg total) by mouth 2 (two) times daily. X 7 days 01/24/15   Janne Napoleon, NP  nystatin-triamcinolone [facility] II) cream Apply to affected area daily 01/26/15   Ashley Murrain, NP  penicillin v potassium (VEETID) 500 MG tablet Take 1 tablet (500 mg total) by mouth 2 (two) times daily. 01/23/15   Delsa Grana, PA-C   BP 127/92 mmHg  Pulse 80  Temp(Src) 98.7 F (37.1 C) (Oral)  Resp 20  Ht 5\' 5"  (1.651 m)  Wt 114 lb (51.71 kg)  BMI 18.97 kg/m2  SpO2 100%  LMP 03/07/2014 Physical Exam  Constitutional: She is oriented to person, place, and time. She appears well-developed and well-nourished. No distress.  Eyes: EOM are normal.  Neck: Neck supple.  Cardiovascular: Normal rate.   Pulmonary/Chest: Effort  normal.  Abdominal: Soft. There is no tenderness.  Genitourinary:  External genitalia with irritation and d/c noted. Thick white d/c vaginal vault. No tenderness on exam.   Musculoskeletal: Normal range of motion.  Neurological: She is alert and oriented to person, place, and time. No cranial nerve deficit.  Skin: Skin is warm and dry.  Psychiatric: She has a normal mood and affect. Her behavior is normal.  Nursing note and vitals reviewed.   ED Course  Procedures  Cultures pending from previous visit.  MDM  34 y.o. female with vaginal d/c and itching for over a week. Recently treated with Diflucan but has not gotten results  yet. Will d/c home to follow up with GYN if symptoms persist.   Final diagnoses:  Vaginal irritation  Vaginal discharge       [facility], NP 01/26/15 [place], MD 01/26/15 2347

## 2015-01-26 NOTE — ED Notes (Signed)
Patient with yeast infection, patient seen at [facility] for same and was given fluconazole twice with no improvement.  Patient now with itching and raw feeling in her vagina.  She states she still has vaginal discharge.

## 2015-01-27 LAB — POCT PREGNANCY, URINE: PREG TEST UR: NEGATIVE

## 2015-01-27 LAB — CERVICOVAGINAL ANCILLARY ONLY
CHLAMYDIA, DNA PROBE: NEGATIVE
Neisseria Gonorrhea: NEGATIVE

## 2015-01-28 LAB — CERVICOVAGINAL ANCILLARY ONLY: Wet Prep (BD Affirm): POSITIVE — AB

## 2015-01-28 NOTE — ED Notes (Signed)
Final reports of vaginal /STD testing positive for yeast only, treatment adequate w diflucan

## 2015-02-03 ENCOUNTER — Encounter: Payer: Self-pay | Admitting: Internal Medicine

## 2015-02-03 ENCOUNTER — Ambulatory Visit: Payer: Medicaid Other | Attending: Internal Medicine | Admitting: Internal Medicine

## 2015-02-03 VITALS — BP 119/78 | HR 66 | Temp 98.5°F | Resp 16 | Ht 65.0 in | Wt 142.4 lb

## 2015-02-03 DIAGNOSIS — B373 Candidiasis of vulva and vagina: Secondary | ICD-10-CM

## 2015-02-03 DIAGNOSIS — N3001 Acute cystitis with hematuria: Secondary | ICD-10-CM

## 2015-02-03 DIAGNOSIS — B3731 Acute candidiasis of vulva and vagina: Secondary | ICD-10-CM

## 2015-02-03 LAB — POCT URINALYSIS DIPSTICK
Bilirubin, UA: NEGATIVE
GLUCOSE UA: NEGATIVE
Ketones, UA: NEGATIVE
NITRITE UA: NEGATIVE
PH UA: 6
PROTEIN UA: 30
Spec Grav, UA: >=1.03
Urobilinogen, UA: 4

## 2015-02-03 MED ORDER — MONISTAT 7 COMPLETE THERAPY 100-2 MG-% VA KIT: PACK | VAGINAL | Status: DC

## 2015-02-03 MED ORDER — CIPROFLOXACIN HCL 500 MG PO TABS: 500.0000 mg | ORAL_TABLET | Freq: Two times a day (BID) | ORAL | Status: DC

## 2015-02-03 NOTE — Progress Notes (Signed)
Patient ID: [name], female   DOB: 23-Jun-1981, 34 y.o.   MRN: [id number]  CC: dysuria  HPI: [name] is a 34 y.o. female here today for a follow up visit.  Patient has past medical history of chronic pancreatitis and munchausen's syndrome. She was seen in the ER twice for symptoms for vaginal discharge and was found to have a yeast infection. She was given a prescription for flagyl and diflucan in which she has completed both. She is now concerned about dysuria. She states that for the past 2 days she has been having burning with urination. She denies abdominal pain, flank pain, fever, chills. She has already been tested for all STD's and pregnancy which were negative.   Patient has No headache, No chest pain, No abdominal pain - No Nausea, No new weakness tingling or numbness, No Cough - SOB.  Allergies  Allergen Reactions  . Celecoxib Anaphylaxis and Swelling  . Coconut Flavor Shortness Of Breath and Swelling  . Fentanyl Anaphylaxis and Rash  . Ibuprofen Anaphylaxis and Rash  . Ketorolac Tromethamine Anaphylaxis  . Meperidine Hcl Anaphylaxis  . Morphine Anaphylaxis and Rash  . Ondansetron Anaphylaxis  . Other Shortness Of Breath and Swelling    GI cocktail  . Shellfish Allergy Anaphylaxis    Crab and lobster  . Propoxycaine Other (See Comments)    Throat swelling  . Protonix [Pantoprazole Sodium]     Throat Swelling.   . Robitussin (Alcohol Free) [Guaifenesin] Itching  . Aspirin Rash  . Contrast Media [Iodinated Diagnostic Agents] Rash  . Ketamine Itching  . Propoxyphene N-Acetaminophen Rash  . Tylenol [Acetaminophen] Hives, Itching and Rash   Past Medical History  Diagnosis Date  . Pancreatitis   . S/P laparoscopic cholecystectomy   . Fibroids   . Ovarian cyst   . Chronic abdominal pain   . Anemia    Current Outpatient Prescriptions on File Prior to Visit  Medication Sig Dispense Refill  . fluconazole (DIFLUCAN) 150 MG tablet 1 tab po x 1. May repeat  in 72 hours if no improvement 2 tablet 0  . lidocaine (XYLOCAINE) 2 % solution Use as directed 20 mLs in the mouth or throat as needed for mouth pain. 100 mL 0  . metoCLOPramide (REGLAN) 10 MG tablet Take 1 tablet (10 mg total) by mouth every 6 (six) hours as needed for nausea or vomiting. 10 tablet 0  . metroNIDAZOLE (FLAGYL) 500 MG tablet Take 1 tablet (500 mg total) by mouth 2 (two) times daily. X 7 days 14 tablet 0  . nystatin-triamcinolone (MYCOLOG II) cream Apply to affected area daily 15 g 0  . penicillin v potassium (VEETID) 500 MG tablet Take 1 tablet (500 mg total) by mouth 2 (two) times daily. 20 tablet 0   No current facility-administered medications on file prior to visit.   Family History  Problem Relation Age of Onset  . Diabetes Mother   . Hypertension Mother   . Hypertension Father   . Diabetes Father   . Asthma Daughter    History   Social History  . Marital Status: Single    Spouse Name: N/A  . Number of Children: N/A  . Years of Education: N/A   Occupational History  . stay at home mom    Social History Main Topics  . Smoking status: Current Every Day Smoker -- 0.25 packs/day for 0 years    Types: Cigarettes    Last Attempt to Quit: 09/01/2012  . Smokeless tobacco:  Never Used  . Alcohol Use: No  . Drug Use: No  . Sexual Activity: Not on file   Other Topics Concern  . Not on file   Social History Narrative    Review of Systems: See HPI     Objective:   Filed Vitals:   02/03/15 1026  BP: 119/78  Pulse: 66  Temp: 98.5 F (36.9 C)  Resp: 16    Physical Exam  Cardiovascular: Normal rate, regular rhythm and normal heart sounds.   Pulmonary/Chest: Effort normal and breath sounds normal.  Abdominal: Soft. Bowel sounds are normal. She exhibits no distension. There is no tenderness.  Negative CVA tenderness     Lab Results  Component Value Date   WBC 9.9 01/14/2015   HGB 13.1 01/14/2015   HCT 39.7 01/14/2015   MCV 84.1 01/14/2015   PLT  304 01/14/2015   Lab Results  Component Value Date   CREATININE 0.80 01/14/2015   BUN 13 01/14/2015   NA 141 01/14/2015   K 3.3* 01/14/2015   CL 105 01/14/2015   CO2 25 01/14/2015    No results found for: HGBA1C Lipid Panel     Component Value Date/Time   CHOL 185 02/05/2011 0705   TRIG 100 02/05/2011 0705   HDL 66 02/05/2011 0705   CHOLHDL 2.8 02/05/2011 0705   VLDL 20 02/05/2011 0705   LDLCALC 99 02/05/2011 0705       Assessment and plan:   [name] was seen today for urinary tract infection.  Diagnoses and all orders for this visit:  Acute cystitis with hematuria Orders: -     POCT urinalysis dipstick -     Begin ciprofloxacin (CIPRO) 500 MG tablet; Take 1 tablet (500 mg total) by mouth 2 (two) times daily. I believe patients prolong vaginitis has predisposed her to a urinary tract infection. I will treat with a 3 day course.  Candida vaginitis Orders: -     Miconazole Nitrate-Wipes (MONISTAT 7 COMPLETE THERAPY) 100-2 MG-% KIT; Apply intravaginal cream every night for 7 days Since pills have not been effective, I will transition patient to a 7 day course of intra-vaginal cream for better coverage.    Return if symptoms worsen or fail to improve.       Lance Bosch, [facility] [phone] 02/03/2015, 10:45 AM

## 2015-02-03 NOTE — Progress Notes (Signed)
Patient is here for uti sx States when she urinates she has blood in urine Does burn when urinating  Have been taking meds for vaginal discharge

## 2015-02-03 NOTE — Patient Instructions (Signed)
Candida Infection A Candida infection (also called yeast, fungus, and Monilia infection) is an overgrowth of yeast that can occur anywhere on the body. A yeast infection commonly occurs in warm, moist body areas. Usually, the infection remains localized but can spread to become a systemic infection. A yeast infection may be a sign of a more severe disease such as diabetes, leukemia, or AIDS. A yeast infection can occur in both men and women. In women, Candida vaginitis is a vaginal infection. It is one of the most common causes of vaginitis. Men usually do not have symptoms or know they have an infection until other problems develop. Men may find out they have a yeast infection because their sex partner has a yeast infection. Uncircumcised men are more likely to get a yeast infection than circumcised men. This is because the uncircumcised glans is not exposed to air and does not remain as dry as that of a circumcised glans. Older adults may develop yeast infections around dentures. CAUSES  Women  Antibiotics.  Steroid medication taken for a long time.  Being overweight (obese).  Diabetes.  Poor immune condition.  Certain serious medical conditions.  Immune suppressive medications for organ transplant patients.  Chemotherapy.  Pregnancy.  Menstruation.  Stress and fatigue.  Intravenous drug use.  Oral contraceptives.  Wearing tight-fitting clothes in the crotch area.  Catching it from a sex partner who has a yeast infection.  Spermicide.  Intravenous, urinary, or other catheters. Men  Catching it from a sex partner who has a yeast infection.  Having oral or anal sex with a person who has the infection.  Spermicide.  Diabetes.  Antibiotics.  Poor immune system.  Medications that suppress the immune system.  Intravenous drug use.  Intravenous, urinary, or other catheters. SYMPTOMS  Women  Thick, white vaginal discharge.  Vaginal itching.  Redness and  swelling in and around the vagina.  Irritation of the lips of the vagina and perineum.  Blisters on the vaginal lips and perineum.  Painful sexual intercourse.  Low blood sugar (hypoglycemia).  Painful urination.  Bladder infections.  Intestinal problems such as constipation, indigestion, bad breath, bloating, increase in gas, diarrhea, or loose stools. Men  Men may develop intestinal problems such as constipation, indigestion, bad breath, bloating, increase in gas, diarrhea, or loose stools.  Dry, cracked skin on the penis with itching or discomfort.  Jock itch.  Dry, flaky skin.  Athlete's foot.  Hypoglycemia. DIAGNOSIS  Women  A history and an exam are performed.  The discharge may be examined under a microscope.  A culture may be taken of the discharge. Men  A history and an exam are performed.  Any discharge from the penis or areas of cracked skin will be looked at under the microscope and cultured.  Stool samples may be cultured. TREATMENT  Women  Vaginal antifungal suppositories and creams.  Medicated creams to decrease irritation and itching on the outside of the vagina.  Warm compresses to the perineal area to decrease swelling and discomfort.  Oral antifungal medications.  Medicated vaginal suppositories or cream for repeated or recurrent infections.  Wash and dry the irritation areas before applying the cream.  Eating yogurt with Lactobacillus may help with prevention and treatment.  Sometimes painting the vagina with gentian violet solution may help if creams and suppositories do not work. Men  Antifungal creams and oral antifungal medications.  Sometimes treatment must continue for 30 days after the symptoms go away to prevent recurrence. HOME CARE INSTRUCTIONS    Women  Use cotton underwear and avoid tight-fitting clothing.  Avoid colored, scented toilet paper and deodorant tampons or pads.  Do not douche.  Keep your diabetes  under control.  Finish all the prescribed medications.  Keep your skin clean and dry.  Consume milk or yogurt with Lactobacillus-active culture regularly. If you get frequent yeast infections and think that is what the infection is, there are over-the-counter medications that you can get. If the infection does not show healing in 3 days, talk to your caregiver.  Tell your sex partner you have a yeast infection. Your partner may need treatment also, especially if your infection does not clear up or recurs. Men  Keep your skin clean and dry.  Keep your diabetes under control.  Finish all prescribed medications.  Tell your sex partner that you have a yeast infection so he or she can be treated if necessary. SEEK MEDICAL CARE IF:   Your symptoms do not clear up or worsen in one week after treatment.  You have an oral temperature above 102 F (38.9 C).  You have trouble swallowing or eating for a prolonged time.  You develop blisters on and around your vagina.  You develop vaginal bleeding and it is not your menstrual period.  You develop abdominal pain.  You develop intestinal problems as mentioned above.  You get weak or light-headed.  You have painful or increased urination.  You have pain during sexual intercourse. MAKE SURE YOU:   Understand these instructions.  Will watch your condition.  Will get help right away if you are not doing well or get worse. Document Released: 07/29/2004 Document Revised: 11/05/2013 Document Reviewed: 11/10/2009 [facility] Patient Information 2015 [place], [place]. This information is not intended to replace advice given to you by your health care provider. Make sure you discuss any questions you have with your health care provider. Safe Sex Safe sex is about reducing the risk of giving or getting a sexually transmitted disease (STD). STDs are spread through sexual contact involving the genitals, mouth, or rectum. Some STDs can be cured and  others cannot. Safe sex can also prevent unintended pregnancies.  WHAT ARE SOME SAFE SEX PRACTICES?  Limit your sexual activity to only one partner who is having sex with only you.  Talk to your partner about his or her past partners, past STDs, and drug use.  Use a condom every time you have sexual intercourse. This includes vaginal, oral, and anal sexual activity. Both females and males should wear condoms during oral sex. Only use latex or polyurethane condoms and water-based lubricants. Using petroleum-based lubricants or oils to lubricate a condom will weaken the condom and increase the chance that it will break. The condom should be in place from the beginning to the end of sexual activity. Wearing a condom reduces, but does not completely eliminate, your risk of getting or giving an STD. STDs can be spread by contact with infected body fluids and skin.  Get vaccinated for hepatitis B and HPV.  Avoid alcohol and recreational drugs, which can affect your judgment. You may forget to use a condom or participate in high-risk sex.  For females, avoid douching after sexual intercourse. Douching can spread an infection farther into the reproductive tract.  Check your body for signs of sores, blisters, rashes, or unusual discharge. See your health care provider if you notice any of these signs.  Avoid sexual contact if you have symptoms of an infection or are being treated for an STD.  If you or your partner has herpes, avoid sexual contact when blisters are present. Use condoms at all other times.  If you are at risk of being infected with HIV, it is recommended that you take a prescription medicine daily to prevent HIV infection. This is called pre-exposure prophylaxis (PrEP). You are considered at risk if:  You are a man who has sex with other men (MSM).  You are a heterosexual man or woman who is sexually active with more than one partner.  You take drugs by injection.  You are sexually  active with a partner who has HIV.  Talk with your health care provider about whether you are at high risk of being infected with HIV. If you choose to begin PrEP, you should first be tested for HIV. You should then be tested every 3 months for as long as you are taking PrEP.  See your health care provider for regular screenings, exams, and tests for other STDs. Before having sex with a new partner, each of you should be screened for STDs and should talk about the results with each other. WHAT ARE THE BENEFITS OF SAFE SEX?   There is less chance of getting or giving an STD.  You can prevent unwanted or unintended pregnancies.  By discussing safe sex concerns with your partner, you may increase feelings of intimacy, comfort, trust, and honesty between the two of you. Document Released: 07/29/2004 Document Revised: 11/05/2013 Document Reviewed: 12/13/2011 [facility] Patient Information 2015 [place], [place]. This information is not intended to replace advice given to you by your health care provider. Make sure you discuss any questions you have with your health care provider.

## 2015-03-03 ENCOUNTER — Encounter (HOSPITAL_COMMUNITY): Payer: Self-pay | Admitting: Vascular Surgery

## 2015-03-03 ENCOUNTER — Emergency Department (HOSPITAL_COMMUNITY)
Admission: EM | Admit: 2015-03-03 | Discharge: 2015-03-04 | Disposition: A | Discharge status: Home / Self Care | Payer: Medicaid Other | Attending: Emergency Medicine | Admitting: Emergency Medicine

## 2015-03-03 DIAGNOSIS — Z720 Tobacco use: Secondary | ICD-10-CM | POA: Diagnosis not present

## 2015-03-03 DIAGNOSIS — Z862 Personal history of diseases of the blood and blood-forming organs and certain disorders involving the immune mechanism: Secondary | ICD-10-CM | POA: Insufficient documentation

## 2015-03-03 DIAGNOSIS — Z86018 Personal history of other benign neoplasm: Secondary | ICD-10-CM | POA: Insufficient documentation

## 2015-03-03 DIAGNOSIS — N390 Urinary tract infection, site not specified: Secondary | ICD-10-CM | POA: Diagnosis not present

## 2015-03-03 DIAGNOSIS — M545 Low back pain: Secondary | ICD-10-CM | POA: Insufficient documentation

## 2015-03-03 DIAGNOSIS — G8929 Other chronic pain: Secondary | ICD-10-CM | POA: Insufficient documentation

## 2015-03-03 DIAGNOSIS — Z8742 Personal history of other diseases of the female genital tract: Secondary | ICD-10-CM | POA: Diagnosis not present

## 2015-03-03 DIAGNOSIS — R109 Unspecified abdominal pain: Secondary | ICD-10-CM | POA: Diagnosis present

## 2015-03-03 DIAGNOSIS — Z90711 Acquired absence of uterus with remaining cervical stump: Secondary | ICD-10-CM | POA: Diagnosis not present

## 2015-03-03 DIAGNOSIS — N938 Other specified abnormal uterine and vaginal bleeding: Secondary | ICD-10-CM | POA: Insufficient documentation

## 2015-03-03 DIAGNOSIS — Z79899 Other long term (current) drug therapy: Secondary | ICD-10-CM | POA: Diagnosis not present

## 2015-03-03 LAB — URINE MICROSCOPIC-ADD ON

## 2015-03-03 LAB — CBC
HCT: 38.1 % (ref 36.0–46.0)
Hemoglobin: 12.8 g/dL (ref 12.0–15.0)
MCH: 28.2 pg (ref 26.0–34.0)
MCHC: 33.6 g/dL (ref 30.0–36.0)
MCV: 83.9 fL (ref 78.0–100.0)
PLATELETS: 229 10*3/uL (ref 150–400)
RBC: 4.54 MIL/uL (ref 3.87–5.11)
RDW: 11.6 % (ref 11.5–15.5)
WBC: 8.2 10*3/uL (ref 4.0–10.5)

## 2015-03-03 LAB — COMPREHENSIVE METABOLIC PANEL
ALT: 91 U/L — AB (ref 14–54)
AST: 37 U/L (ref 15–41)
Albumin: 4.1 g/dL (ref 3.5–5.0)
Alkaline Phosphatase: 101 U/L (ref 38–126)
Anion gap: 9 (ref 5–15)
BUN: 12 mg/dL (ref 6–20)
CHLORIDE: 109 mmol/L (ref 101–111)
CO2: 25 mmol/L (ref 22–32)
CREATININE: 0.9 mg/dL (ref 0.44–1.00)
Calcium: 9.4 mg/dL (ref 8.9–10.3)
Glucose, Bld: 122 mg/dL — ABNORMAL HIGH (ref 65–99)
POTASSIUM: 3.5 mmol/L (ref 3.5–5.1)
Sodium: 143 mmol/L (ref 135–145)
Total Bilirubin: 0.8 mg/dL (ref 0.3–1.2)
Total Protein: 7.6 g/dL (ref 6.5–8.1)

## 2015-03-03 LAB — URINALYSIS, ROUTINE W REFLEX MICROSCOPIC
Bilirubin Urine: NEGATIVE
Glucose, UA: NEGATIVE mg/dL
KETONES UR: NEGATIVE mg/dL
NITRITE: POSITIVE — AB
PH: 6 (ref 5.0–8.0)
PROTEIN: NEGATIVE mg/dL
Specific Gravity, Urine: 1.011 (ref 1.005–1.030)
UROBILINOGEN UA: 0.2 mg/dL (ref 0.0–1.0)

## 2015-03-03 LAB — LIPASE, BLOOD: LIPASE: 23 U/L (ref 22–51)

## 2015-03-03 NOTE — ED Notes (Signed)
Pelvic cart at bedside and set up for exam

## 2015-03-03 NOTE — ED Provider Notes (Signed)
CSN: [id number]     Arrival date & time 03/03/15  1808 History   First MD Initiated Contact with Patient 03/03/15 2316     Chief Complaint  Patient presents with  . Abdominal Pain  . Back Pain  . 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 Patient states that she has been experiencing bleeding from her vagina x 3 days. She reports that symptoms have been intermittent, with cessation in bleeding x 1 day; symptoms recurred 24 hours ago. Patient endorses lower abdominal cramping in her b/l lower extremities. She has taken Tylenol and BC powder for pain without relief. Patient has had some nausea. No vomiting or fever. Patient is known s/p hysterectomy, but she is reporting vaginal bleeding 1 month ago which she states was her "normal period". Abdominal surgical hx significant for laparoscopic cholecystectomy and ERCP with stent placement.  Patient has a care plan.  Patient is a 34 y.o. female presenting with abdominal pain, back pain, and vaginal bleeding. The history is provided by the patient. No language interpreter was used.  Abdominal Pain Associated symptoms: hematuria, nausea and vaginal bleeding   Associated symptoms: no chest pain, no dysuria, no fever, no shortness of breath and no vomiting   Back Pain Associated symptoms: abdominal pain   Associated symptoms: no chest pain, no dysuria and no fever   Vaginal Bleeding Associated symptoms: abdominal pain, back pain and nausea   Associated symptoms: no dysuria and no fever     Past Medical History  Diagnosis Date  . Pancreatitis   . S/P laparoscopic cholecystectomy   . Fibroids   . Ovarian cyst   . Chronic abdominal pain   . Anemia     Past Surgical History  Procedure Laterality Date  . Cholecystectomy    . Dilation and curettage of uterus    . Ercp w/ metal stent placement    . Oophorectomy     Family History  Problem Relation Age of Onset  . Diabetes Mother   . Hypertension Mother   . Hypertension Father   . Diabetes Father   . Asthma Daughter    Social History  Substance Use Topics  . Smoking status: Current Every Day Smoker -- 0.25 packs/day for 0 years    Types: Cigarettes    Last Attempt to Quit: 09/01/2012  . Smokeless tobacco: Never Used  . Alcohol Use: No   OB History    Gravida Para Term Preterm AB TAB SAB Ectopic Multiple Living   '4 2 1 1 2  2         ' Review of Systems  Constitutional: Negative for fever.  Respiratory: Negative for shortness of breath.   Cardiovascular: Negative for chest pain.  Gastrointestinal: Positive for nausea and abdominal pain. Negative for vomiting.  Genitourinary: Positive for hematuria and vaginal bleeding. Negative for dysuria.  Musculoskeletal: Positive for back pain.  All other systems reviewed and are negative.   Allergies  Celecoxib; Coconut flavor; Fentanyl; Ibuprofen; Ketorolac tromethamine; Meperidine hcl; Morphine; Ondansetron; Other; Shellfish allergy; Propoxycaine; Protonix; Robitussin (alcohol free); Aspirin; Contrast media; Ketamine; Propoxyphene n-acetaminophen; and Tylenol  Home Medications   Prior to Admission medications   Medication Sig Start Date End Date Taking? Authorizing Provider  ciprofloxacin (CIPRO) 500 MG tablet Take 1 tablet (500 mg total) by mouth 2 (two)  times daily. Patient not taking: Reported on 03/03/2015 02/03/15   Lance Bosch, NP  fluconazole (DIFLUCAN) 150 MG tablet 1 tab po x 1. May repeat in 72 hours if no improvement Patient not taking: Reported on 03/03/2015 01/24/15   Janne Napoleon, NP  lidocaine (XYLOCAINE) 2 % solution Use as directed 20 mLs in the mouth or throat as needed for mouth pain. Patient not taking:  Reported on 03/03/2015 01/23/15   Delsa Grana, PA-C  metoCLOPramide (REGLAN) 10 MG tablet Take 1 tablet (10 mg total) by mouth every 6 (six) hours as needed for nausea or vomiting. Patient not taking: Reported on 03/03/2015 01/14/15   Shanon Rosser, MD  metroNIDAZOLE (FLAGYL) 500 MG tablet Take 1 tablet (500 mg total) by mouth 2 (two) times daily. X 7 days Patient not taking: Reported on 03/03/2015 01/24/15   Janne Napoleon, NP  Miconazole Nitrate-Wipes (MONISTAT 7 COMPLETE THERAPY) 100-2 MG-% KIT Apply intravaginal cream every night for 7 days Patient not taking: Reported on 03/03/2015 02/03/15   Lance Bosch, NP  nitrofurantoin, macrocrystal-monohydrate, (MACROBID) 100 MG capsule Take 1 capsule (100 mg total) by mouth 2 (two) times daily. 03/04/15   Antonietta Breach, PA-C  nystatin-triamcinolone [facility] II) cream Apply to affected area daily Patient not taking: Reported on 03/03/2015 01/26/15   Ashley Murrain, NP  penicillin v potassium (VEETID) 500 MG tablet Take 1 tablet (500 mg total) by mouth 2 (two) times daily. Patient not taking: Reported on 03/03/2015 01/23/15   Delsa Grana, PA-C  phenazopyridine (PYRIDIUM) 200 MG tablet Take 1 tablet (200 mg total) by mouth 3 (three) times daily. 03/04/15   Antonietta Breach, PA-C   BP 137/96 mmHg  Pulse 86  Temp(Src) 98.7 F (37.1 C) (Oral)  Resp 16  SpO2 100%  LMP 03/07/2014   Physical Exam  Constitutional: She is oriented to person, place, and time. She appears well-developed and well-nourished. No distress.  Nontoxic/nonseptic appearing  HENT:  Head: Normocephalic and atraumatic.  Eyes: Conjunctivae and EOM are normal. No scleral icterus.  Neck: Normal range of motion.  Cardiovascular: Normal rate, regular rhythm and intact distal pulses.   Pulmonary/Chest: Effort normal and breath sounds normal. No respiratory distress. She has no wheezes. She has no rales.  Abdominal: Soft. She exhibits no distension. There is tenderness. There is no rebound.  TTP in b/l lower  quadrants and suprapubic abdomen. No masses or peritoneal signs. Abdomen soft.  Genitourinary: There is no rash, tenderness, lesion or injury on the right labia. There is no rash, tenderness, lesion or injury on the left labia. Right adnexum displays no mass, no tenderness and no fullness. Left adnexum displays no mass, no tenderness and no fullness. No bleeding in the vagina. No signs of injury around the vagina. Vaginal discharge (white, thick) found.  Uterus absent   Musculoskeletal: Normal range of motion.  Neurological: She is alert and oriented to person, place, and time. She exhibits normal muscle tone. Coordination normal.  Skin: Skin is warm and dry. No rash noted. She is not diaphoretic. No erythema. No pallor.  Psychiatric: She has a normal mood and affect. Her behavior is normal.  Nursing note and vitals reviewed.   ED Course  Procedures (including critical care time) Labs Review Labs Reviewed  WET PREP, GENITAL - Abnormal; Notable for the following:    WBC, Wet Prep HPF POC MODERATE (*)    All other components within normal limits  COMPREHENSIVE METABOLIC PANEL - Abnormal; Notable for the following:  Glucose, Bld 122 (*)    ALT 91 (*)    All other components within normal limits  URINALYSIS, ROUTINE W REFLEX MICROSCOPIC (NOT AT [facility]) - Abnormal; Notable for the following:    Color, Urine ORANGE (*)    APPearance CLOUDY (*)    Hgb urine dipstick MODERATE (*)    Nitrite POSITIVE (*)    Leukocytes, UA LARGE (*)    All other components within normal limits  URINE MICROSCOPIC-ADD ON - Abnormal; Notable for the following:    Squamous Epithelial / LPF MANY (*)    Bacteria, UA FEW (*)    All other components within normal limits  URINE CULTURE  LIPASE, BLOOD  CBC  GC/CHLAMYDIA PROBE AMP ([place]) NOT AT [facility]    Imaging Review No results found.   I have personally reviewed and evaluated these images and lab results as part of my medical decision-making.   EKG  Interpretation None      MDM   Final diagnoses:  UTI (lower urinary tract infection)    Patient with history of Munchausen syndrome presents to the ED for complaints of vaginal bleeding. Patient with documented history of hysterectomy. Patient also has c/o abdominal pain. She has a known history of similar complaints for which she has been seen by GI. Patient's UA today does have some evidence of RBCs. There are also moderate leukocytes on UA. Patient is nitrite positive, but believe this may be in part to the AZO tabs taken PTA. Still, cannot exclude hemorrhagic cystitis as cause of symptoms. Plan to treat with Macrobid and pyridium. Urine culture sent.  Doubt emergent cause of abdominal pain today. Cramping may be due to cystitis. No leukocytosis or fever noted. No adnexal TTP to suggest torsion. No indication for further emergent workup at this time. Patient stable for discharge with instruction to follow-up with her primary care doctor. Return precautions given. Patient agreeable to plan with no unaddressed concerns. Patient discharged in good condition.   Filed Vitals:   03/03/15 2250 03/03/15 2315 03/04/15 0032 03/04/15 0033  BP: 128/83 122/85 137/96   Pulse: 84 79  86  Temp:      TempSrc:      Resp: 16     SpO2: 100% 100%  100%     Antonietta Breach, PA-C 03/04/15 0045  Wandra Arthurs, MD 03/04/15 1124

## 2015-03-03 NOTE — ED Notes (Signed)
Pt here with "ex"; pt requested that visitor exit room while pt was being interviewed by this nurse

## 2015-03-03 NOTE — ED Notes (Signed)
Pt reports to the ED for eval of abd pain, back pain, diarrhea, and vaginal bleeding x 3 days. She reports the symptoms have been getting worse. Has not tried any OTC medications. She also reports nausea but denies any active vomiting. Pt A&Ox4, resp e/u, and skin warm and dry.

## 2015-03-03 NOTE — ED Notes (Signed)
Wants blood drawn with IV

## 2015-03-04 LAB — WET PREP, GENITAL
CLUE CELLS WET PREP: NONE SEEN
Trich, Wet Prep: NONE SEEN
YEAST WET PREP: NONE SEEN

## 2015-03-04 LAB — GC/CHLAMYDIA PROBE AMP (~~LOC~~) NOT AT ARMC
CHLAMYDIA, DNA PROBE: NEGATIVE
Neisseria Gonorrhea: NEGATIVE

## 2015-03-04 MED ORDER — PHENAZOPYRIDINE HCL 200 MG PO TABS: 200.0000 mg | ORAL_TABLET | Freq: Three times a day (TID) | ORAL | Status: DC

## 2015-03-04 MED ORDER — NITROFURANTOIN MONOHYD MACRO 100 MG PO CAPS: 100.0000 mg | ORAL_CAPSULE | Freq: Two times a day (BID) | ORAL | Status: DC

## 2015-03-04 NOTE — Discharge Instructions (Signed)

## 2015-03-05 LAB — URINE CULTURE

## 2015-06-02 ENCOUNTER — Encounter (HOSPITAL_COMMUNITY): Payer: Self-pay | Admitting: Emergency Medicine

## 2015-06-02 ENCOUNTER — Emergency Department (HOSPITAL_COMMUNITY)
Admission: EM | Admit: 2015-06-02 | Discharge: 2015-06-02 | Disposition: A | Discharge status: Home / Self Care | Payer: Medicaid Other | Attending: Emergency Medicine | Admitting: Emergency Medicine

## 2015-06-02 DIAGNOSIS — Z9049 Acquired absence of other specified parts of digestive tract: Secondary | ICD-10-CM | POA: Insufficient documentation

## 2015-06-02 DIAGNOSIS — Z86018 Personal history of other benign neoplasm: Secondary | ICD-10-CM | POA: Diagnosis not present

## 2015-06-02 DIAGNOSIS — Z87891 Personal history of nicotine dependence: Secondary | ICD-10-CM | POA: Diagnosis not present

## 2015-06-02 DIAGNOSIS — Z8719 Personal history of other diseases of the digestive system: Secondary | ICD-10-CM | POA: Diagnosis not present

## 2015-06-02 DIAGNOSIS — J209 Acute bronchitis, unspecified: Secondary | ICD-10-CM | POA: Insufficient documentation

## 2015-06-02 DIAGNOSIS — J40 Bronchitis, not specified as acute or chronic: Secondary | ICD-10-CM

## 2015-06-02 DIAGNOSIS — Z8742 Personal history of other diseases of the female genital tract: Secondary | ICD-10-CM | POA: Insufficient documentation

## 2015-06-02 DIAGNOSIS — Z79899 Other long term (current) drug therapy: Secondary | ICD-10-CM | POA: Insufficient documentation

## 2015-06-02 DIAGNOSIS — G8929 Other chronic pain: Secondary | ICD-10-CM | POA: Diagnosis not present

## 2015-06-02 DIAGNOSIS — R05 Cough: Secondary | ICD-10-CM | POA: Diagnosis present

## 2015-06-02 DIAGNOSIS — Z862 Personal history of diseases of the blood and blood-forming organs and certain disorders involving the immune mechanism: Secondary | ICD-10-CM | POA: Insufficient documentation

## 2015-06-02 MED ORDER — CEPHALEXIN 500 MG PO CAPS: 500.0000 mg | ORAL_CAPSULE | Freq: Four times a day (QID) | ORAL | Status: DC

## 2015-06-02 NOTE — ED Provider Notes (Signed)
CSN: [id number]     Arrival date & time 06/02/15  8366 History   First MD Initiated Contact with Patient 06/02/15 [phone]     Chief Complaint  Patient presents with  . URI     (Consider location/radiation/quality/duration/timing/severity/associated sxs/prior Treatment) HPI  [name] is a 34 y.o. female who presents for evaluation of cough productive of yellow sputum present for 2 days. She has left-sided pain when she coughs. No fever, chills, nausea, vomiting or diarrhea. She had some urinary symptoms, with dysuria couple of weeks ago, but they are improving. There are no other no modifying factors.   Past Medical History  Diagnosis Date  . Pancreatitis   . S/P laparoscopic cholecystectomy   . Fibroids   . Ovarian cyst   . Chronic abdominal pain   . Anemia    Past Surgical History  Procedure Laterality Date  . Cholecystectomy    . Dilation and curettage of uterus    . Ercp w/ metal stent placement    . Oophorectomy     Family History  Problem Relation Age of Onset  . Diabetes Mother   . Hypertension Mother   . Hypertension Father   . Diabetes Father   . Asthma Daughter    Social History  Substance Use Topics  . Smoking status: Former Smoker -- 0.25 packs/day for 0 years    Types: Cigarettes    Quit date: 06/01/2014  . Smokeless tobacco: Never Used  . Alcohol Use: No   OB History    Gravida Para Term Preterm AB TAB SAB Ectopic Multiple Living   '4 2 1 1 2  2        ' Review of Systems  All other systems reviewed and are negative.     Allergies  Celecoxib; Coconut flavor; Fentanyl; Ibuprofen; Ketorolac tromethamine; Meperidine hcl; Morphine; Ondansetron; Other; Shellfish allergy; Propoxycaine; Protonix; Robitussin (alcohol free); Aspirin; Contrast media; Ketamine; Propoxyphene n-acetaminophen; and Tylenol  Home Medications   Prior to Admission medications   Medication Sig Start Date End Date Taking? Authorizing Provider  cephALEXin (KEFLEX) 500  MG capsule Take 1 capsule (500 mg total) by mouth 4 (four) times daily. 06/02/15   Daleen Bo, MD  ciprofloxacin (CIPRO) 500 MG tablet Take 1 tablet (500 mg total) by mouth 2 (two) times daily. Patient not taking: Reported on 03/03/2015 02/03/15   Lance Bosch, NP  fluconazole (DIFLUCAN) 150 MG tablet 1 tab po x 1. May repeat in 72 hours if no improvement Patient not taking: Reported on 03/03/2015 01/24/15   Janne Napoleon, NP  lidocaine (XYLOCAINE) 2 % solution Use as directed 20 mLs in the mouth or throat as needed for mouth pain. Patient not taking: Reported on 03/03/2015 01/23/15   Delsa Grana, PA-C  metoCLOPramide (REGLAN) 10 MG tablet Take 1 tablet (10 mg total) by mouth every 6 (six) hours as needed for nausea or vomiting. Patient not taking: Reported on 03/03/2015 01/14/15   Shanon Rosser, MD  metroNIDAZOLE (FLAGYL) 500 MG tablet Take 1 tablet (500 mg total) by mouth 2 (two) times daily. X 7 days Patient not taking: Reported on 03/03/2015 01/24/15   Janne Napoleon, NP  Miconazole Nitrate-Wipes (MONISTAT 7 COMPLETE THERAPY) 100-2 MG-% KIT Apply intravaginal cream every night for 7 days Patient not taking: Reported on 03/03/2015 02/03/15   Lance Bosch, NP  nitrofurantoin, macrocrystal-monohydrate, (MACROBID) 100 MG capsule Take 1 capsule (100 mg total) by mouth 2 (two) times daily. 03/04/15   Antonietta Breach, PA-C  nystatin-triamcinolone (  MYCOLOG II) cream Apply to affected area daily Patient not taking: Reported on 03/03/2015 01/26/15   Ashley Murrain, NP  penicillin v potassium (VEETID) 500 MG tablet Take 1 tablet (500 mg total) by mouth 2 (two) times daily. Patient not taking: Reported on 03/03/2015 01/23/15   Delsa Grana, PA-C  phenazopyridine (PYRIDIUM) 200 MG tablet Take 1 tablet (200 mg total) by mouth 3 (three) times daily. 03/04/15   Antonietta Breach, PA-C   BP 118/83 mmHg  Pulse 104  Temp(Src) 99.6 F (37.6 C) (Oral)  Resp 18  Ht '5\' 5"'  (1.651 m)  Wt 142 lb (64.411 kg)  BMI 23.63 kg/m2  SpO2 99%  LMP  05/07/2014 Physical Exam  Constitutional: She is oriented to person, place, and time. She appears well-developed and well-nourished.  HENT:  Head: Normocephalic and atraumatic.  Right Ear: External ear normal.  Left Ear: External ear normal.  Eyes: Conjunctivae and EOM are normal. Pupils are equal, round, and reactive to light.  Neck: Normal range of motion and phonation normal. Neck supple.  Cardiovascular: Normal rate, regular rhythm and normal heart sounds.   Pulmonary/Chest: Effort normal and breath sounds normal. No respiratory distress. She has no wheezes. She has no rales. She exhibits no bony tenderness.  Abdominal: Soft. There is no tenderness.  Musculoskeletal: Normal range of motion.  Neurological: She is alert and oriented to person, place, and time. No cranial nerve deficit or sensory deficit. She exhibits normal muscle tone. Coordination normal.  Skin: Skin is warm, dry and intact.  Psychiatric: She has a normal mood and affect. Her behavior is normal. Judgment and thought content normal.  Nursing note and vitals reviewed.   ED Course  Procedures (including critical care time)  Findings discussed with the patient, all questions were answered.  Labs Review Labs Reviewed - No data to display  Imaging Review No results found. I have personally reviewed and evaluated these images and lab results as part of my medical decision-making.   EKG Interpretation None      MDM   Final diagnoses:  Bronchitis    Nonspecific symptoms of URI, and bronchitis. Patient smokes non- tobacco products. Doubt pneumonia, serious bacterial infection or metabolic instability.  Nursing Notes Reviewed/ Care Coordinated Applicable Imaging Reviewed Interpretation of Laboratory Data incorporated into ED treatment  The patient appears reasonably screened and/or stabilized for discharge and I doubt any other medical condition or other [facility] requiring further screening, evaluation, or  treatment in the ED at this time prior to discharge.  Plan: Home Medications- Keflex; Home Treatments- rest, stop smoking; return here if the recommended treatment, does not improve the symptoms; Recommended follow up- PCP prn     Daleen Bo, MD 06/02/15 [phone]

## 2015-06-02 NOTE — Discharge Instructions (Signed)
Get plenty of rest, and drink a lot of fluids.  Avoid smoking.

## 2015-06-02 NOTE — ED Notes (Signed)
Patient states URI starting Thanksgiving.  Patient states she has cough with green sputum.  Patient states her L side hurts when she coughs.   Patient denies N/V/D.

## 2015-11-12 ENCOUNTER — Emergency Department (HOSPITAL_COMMUNITY)
Admission: EM | Admit: 2015-11-12 | Discharge: 2015-11-12 | Disposition: A | Discharge status: Home / Self Care | Payer: Medicaid Other | Attending: Emergency Medicine | Admitting: Emergency Medicine

## 2015-11-12 ENCOUNTER — Encounter (HOSPITAL_COMMUNITY): Payer: Self-pay | Admitting: *Deleted

## 2015-11-12 DIAGNOSIS — Z86018 Personal history of other benign neoplasm: Secondary | ICD-10-CM | POA: Insufficient documentation

## 2015-11-12 DIAGNOSIS — Y92410 Unspecified street and highway as the place of occurrence of the external cause: Secondary | ICD-10-CM | POA: Diagnosis not present

## 2015-11-12 DIAGNOSIS — M7918 Myalgia, other site: Secondary | ICD-10-CM

## 2015-11-12 DIAGNOSIS — S0990XA Unspecified injury of head, initial encounter: Secondary | ICD-10-CM | POA: Insufficient documentation

## 2015-11-12 DIAGNOSIS — S3992XA Unspecified injury of lower back, initial encounter: Secondary | ICD-10-CM | POA: Insufficient documentation

## 2015-11-12 DIAGNOSIS — Z8719 Personal history of other diseases of the digestive system: Secondary | ICD-10-CM | POA: Diagnosis not present

## 2015-11-12 DIAGNOSIS — Z87891 Personal history of nicotine dependence: Secondary | ICD-10-CM | POA: Insufficient documentation

## 2015-11-12 DIAGNOSIS — Y998 Other external cause status: Secondary | ICD-10-CM | POA: Diagnosis not present

## 2015-11-12 DIAGNOSIS — Z79899 Other long term (current) drug therapy: Secondary | ICD-10-CM | POA: Diagnosis not present

## 2015-11-12 DIAGNOSIS — S199XXA Unspecified injury of neck, initial encounter: Secondary | ICD-10-CM | POA: Diagnosis present

## 2015-11-12 DIAGNOSIS — G8929 Other chronic pain: Secondary | ICD-10-CM | POA: Diagnosis not present

## 2015-11-12 DIAGNOSIS — Y9389 Activity, other specified: Secondary | ICD-10-CM | POA: Insufficient documentation

## 2015-11-12 DIAGNOSIS — Z862 Personal history of diseases of the blood and blood-forming organs and certain disorders involving the immune mechanism: Secondary | ICD-10-CM | POA: Insufficient documentation

## 2015-11-12 DIAGNOSIS — Z792 Long term (current) use of antibiotics: Secondary | ICD-10-CM | POA: Insufficient documentation

## 2015-11-12 MED ORDER — ACETAMINOPHEN 500 MG PO TABS
1000.0000 mg | ORAL_TABLET | Freq: Once | ORAL | Status: AC
  Administered 2015-11-12: 1000 mg via ORAL
  Filled 2015-11-12: qty 2
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
## 2015-11-12 NOTE — ED Notes (Signed)
Declined W/C at D/C and was escorted to lobby by RN. 
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
## 2015-11-12 NOTE — Discharge Instructions (Signed)
Take acetaminophen (Tylenol) up to 975 mg (this is normally 3 over-the-counter pills) up to 3 times a day. Do not drink alcohol. Make sure your other medications do not contain acetaminophen (Read the labels!)  Please follow with your primary care doctor in the next 2 days for a check-up. They must obtain records for further management.   Do not hesitate to return to the Emergency Department for any new, worsening or concerning symptoms.     Muscle Pain, Adult Muscle pain (myalgia) may be caused by many things, including:  Overuse or muscle strain, especially if you are not in shape. This is the most common cause of muscle pain.  Injury.  Bruises.  Viruses, such as the flu.  Infectious diseases.  Fibromyalgia, which is a chronic condition that causes muscle tenderness, fatigue, and headache.  Autoimmune diseases, including lupus.  Certain drugs, including ACE inhibitors and statins. Muscle pain may be mild or severe. In most cases, the pain lasts only a short time and goes away without treatment. To diagnose the cause of your muscle pain, your health care provider will take your medical history. This means he or she will ask you when your muscle pain began and what has been happening. If you have not had muscle pain for very long, your health care provider may want to wait before doing much testing. If your muscle pain has lasted a long time, your health care provider may want to run tests right away. If your health care provider thinks your muscle pain may be caused by illness, you may need to have additional tests to rule out certain conditions.  Treatment for muscle pain depends on the cause. Home care is often enough to relieve muscle pain. Your health care provider may also prescribe anti-inflammatory medicine. HOME CARE INSTRUCTIONS Watch your condition for any changes. The following actions may help to lessen any discomfort you are feeling:  Only take over-the-counter or  prescription medicines as directed by your health care provider.  Apply ice to the sore muscle:  Put ice in a plastic bag.  Place a towel between your skin and the bag.  Leave the ice on for 15-20 minutes, 3-4 times a day.  You may alternate applying hot and cold packs to the muscle as directed by your health care provider.  If overuse is causing your muscle pain, slow down your activities until the pain goes away.  Remember that it is normal to feel some muscle pain after starting a workout program. Muscles that have not been used often will be sore at first.  Do regular, gentle exercises if you are not usually active.  Warm up before exercising to lower your risk of muscle pain.  Do not continue working out if the pain is very bad. Bad pain could mean you have injured a muscle. SEEK MEDICAL CARE IF:  Your muscle pain gets worse, and medicines do not help.  You have muscle pain that lasts longer than 3 days.  You have a rash or fever along with muscle pain.  You have muscle pain after a tick bite.  You have muscle pain while working out, even though you are in good physical condition.  You have redness, soreness, or swelling along with muscle pain.  You have muscle pain after starting a new medicine or changing the dose of a medicine. SEEK IMMEDIATE MEDICAL CARE IF:  You have trouble breathing.  You have trouble swallowing.  You have muscle pain along with a stiff  neck, fever, and vomiting.  You have severe muscle weakness or cannot move part of your body. MAKE SURE YOU:   Understand these instructions.  Will watch your condition.  Will get help right away if you are not doing well or get worse.   This information is not intended to replace advice given to you by your health care provider. Make sure you discuss any questions you have with your health care provider.   Document Released: 05/13/2006 Document Revised: 07/12/2014 Document Reviewed:  04/17/2013 Elsevier Interactive Patient Education [employer].

## 2015-12-11 ENCOUNTER — Encounter (HOSPITAL_COMMUNITY): Payer: Self-pay | Admitting: Emergency Medicine

## 2015-12-11 ENCOUNTER — Emergency Department (HOSPITAL_COMMUNITY)
Admission: EM | Admit: 2015-12-11 | Discharge: 2015-12-11 | Disposition: A | Discharge status: Home / Self Care | Payer: No Typology Code available for payment source | Attending: Emergency Medicine | Admitting: Emergency Medicine

## 2015-12-11 DIAGNOSIS — M542 Cervicalgia: Secondary | ICD-10-CM | POA: Diagnosis not present

## 2015-12-11 DIAGNOSIS — Z87891 Personal history of nicotine dependence: Secondary | ICD-10-CM | POA: Diagnosis not present

## 2015-12-11 DIAGNOSIS — Y939 Activity, unspecified: Secondary | ICD-10-CM | POA: Diagnosis not present

## 2015-12-11 DIAGNOSIS — Z792 Long term (current) use of antibiotics: Secondary | ICD-10-CM | POA: Insufficient documentation

## 2015-12-11 DIAGNOSIS — M546 Pain in thoracic spine: Secondary | ICD-10-CM | POA: Diagnosis present

## 2015-12-11 DIAGNOSIS — Y92410 Unspecified street and highway as the place of occurrence of the external cause: Secondary | ICD-10-CM | POA: Insufficient documentation

## 2015-12-11 DIAGNOSIS — M549 Dorsalgia, unspecified: Secondary | ICD-10-CM

## 2015-12-11 DIAGNOSIS — M545 Low back pain: Secondary | ICD-10-CM | POA: Insufficient documentation

## 2015-12-11 DIAGNOSIS — Y999 Unspecified external cause status: Secondary | ICD-10-CM | POA: Insufficient documentation
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
## 2015-12-11 NOTE — ED Notes (Signed)
PT DISCHARGED. INSTRUCTIONS GIVEN. AAOX4. PT IN NO APPARENT DISTRESS. THE OPPORTUNITY TO ASK QUESTIONS WAS PROVIDED. 
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
## 2016-03-04 ENCOUNTER — Encounter: Payer: Self-pay | Admitting: Student

## 2017-01-18 ENCOUNTER — Encounter (HOSPITAL_COMMUNITY): Payer: Self-pay | Admitting: Emergency Medicine

## 2017-01-18 ENCOUNTER — Emergency Department (HOSPITAL_COMMUNITY)
Admission: EM | Admit: 2017-01-18 | Discharge: 2017-01-18 | Disposition: A | Discharge status: Home / Self Care | Payer: Medicaid Other | Attending: Emergency Medicine | Admitting: Emergency Medicine

## 2017-01-18 DIAGNOSIS — J45909 Unspecified asthma, uncomplicated: Secondary | ICD-10-CM | POA: Insufficient documentation

## 2017-01-18 DIAGNOSIS — R1084 Generalized abdominal pain: Secondary | ICD-10-CM | POA: Diagnosis present

## 2017-01-18 DIAGNOSIS — R197 Diarrhea, unspecified: Secondary | ICD-10-CM | POA: Insufficient documentation

## 2017-01-18 DIAGNOSIS — R509 Fever, unspecified: Secondary | ICD-10-CM | POA: Diagnosis not present

## 2017-01-18 DIAGNOSIS — R112 Nausea with vomiting, unspecified: Secondary | ICD-10-CM | POA: Diagnosis not present

## 2017-01-18 DIAGNOSIS — Z79899 Other long term (current) drug therapy: Secondary | ICD-10-CM | POA: Insufficient documentation

## 2017-01-18 LAB — COMPREHENSIVE METABOLIC PANEL
ALBUMIN: 4.5 g/dL (ref 3.5–5.0)
ALK PHOS: 151 U/L — AB (ref 38–126)
ALT: 75 U/L — AB (ref 14–54)
AST: 45 U/L — AB (ref 15–41)
Anion gap: 9 (ref 5–15)
BILIRUBIN TOTAL: 0.6 mg/dL (ref 0.3–1.2)
BUN: 17 mg/dL (ref 6–20)
CALCIUM: 9.1 mg/dL (ref 8.9–10.3)
CO2: 25 mmol/L (ref 22–32)
CREATININE: 0.76 mg/dL (ref 0.44–1.00)
Chloride: 107 mmol/L (ref 101–111)
GFR calc Af Amer: >60 mL/min (ref >60)
GLUCOSE: 97 mg/dL (ref 65–99)
POTASSIUM: 3.4 mmol/L — AB (ref 3.5–5.1)
Sodium: 141 mmol/L (ref 135–145)
TOTAL PROTEIN: 8.1 g/dL (ref 6.5–8.1)

## 2017-01-18 LAB — CBC
HEMATOCRIT: 39.7 % (ref 36.0–46.0)
Hemoglobin: 13.7 g/dL (ref 12.0–15.0)
MCH: 28.3 pg (ref 26.0–34.0)
MCHC: 34.5 g/dL (ref 30.0–36.0)
MCV: 82 fL (ref 78.0–100.0)
PLATELETS: 257 10*3/uL (ref 150–400)
RBC: 4.84 MIL/uL (ref 3.87–5.11)
RDW: 11.6 % (ref 11.5–15.5)
WBC: 7.8 10*3/uL (ref 4.0–10.5)

## 2017-01-18 LAB — URINALYSIS, ROUTINE W REFLEX MICROSCOPIC
BACTERIA UA: NONE SEEN
BILIRUBIN URINE: NEGATIVE
Glucose, UA: NEGATIVE mg/dL
Ketones, ur: 20 mg/dL — AB
NITRITE: NEGATIVE
PH: 5 (ref 5.0–8.0)
Protein, ur: 30 mg/dL — AB
SPECIFIC GRAVITY, URINE: 1.04 — AB (ref 1.005–1.030)

## 2017-01-18 LAB — PREGNANCY, URINE: PREG TEST UR: NEGATIVE

## 2017-01-18 LAB — RAPID URINE DRUG SCREEN, HOSP PERFORMED
AMPHETAMINES: NOT DETECTED
Barbiturates: NOT DETECTED
Benzodiazepines: NOT DETECTED
Cocaine: NOT DETECTED
OPIATES: NOT DETECTED
TETRAHYDROCANNABINOL: POSITIVE — AB

## 2017-01-18 LAB — LIPASE, BLOOD: Lipase: 23 U/L (ref 11–51)

## 2017-01-18 MED ORDER — DIPHENHYDRAMINE HCL 50 MG/ML IJ SOLN
25.0000 mg | Freq: Once | INTRAMUSCULAR | Status: AC
  Administered 2017-01-18: 25 mg via INTRAMUSCULAR
  Filled 2017-01-18: qty 1

## 2017-01-18 MED ORDER — SODIUM CHLORIDE 0.9 % IV BOLUS (SEPSIS): 1000.0000 mL | Freq: Once | INTRAVENOUS | Status: DC

## 2017-01-18 MED ORDER — PROMETHAZINE HCL 25 MG PO TABS: 25.0000 mg | ORAL_TABLET | Freq: Four times a day (QID) | ORAL | 0 refills | Status: DC | PRN

## 2017-01-18 MED ORDER — PROMETHAZINE HCL 25 MG/ML IJ SOLN
25.0000 mg | Freq: Once | INTRAMUSCULAR | Status: DC
  Filled 2017-01-18: qty 1

## 2017-01-18 MED ORDER — ACETAMINOPHEN 325 MG PO TABS
650.0000 mg | ORAL_TABLET | Freq: Once | ORAL | Status: DC
  Filled 2017-01-18: qty 2

## 2017-01-18 MED ORDER — METOCLOPRAMIDE HCL 5 MG/ML IJ SOLN
10.0000 mg | Freq: Once | INTRAMUSCULAR | Status: AC
  Administered 2017-01-18: 10 mg via INTRAMUSCULAR
  Filled 2017-01-18: qty 2

## 2017-01-18 NOTE — ED Notes (Signed)
Pt tolerated oral intake well.  

## 2017-01-18 NOTE — ED Triage Notes (Signed)
Pt presented by EMS for evaluation of abd pain that started 4 days ago. Pt states she is unable to keep food or liquid down. Pt reports yellow emesis.

## 2017-01-18 NOTE — ED Provider Notes (Signed)
[facility] DEPT Provider Note   CSN: [id number] Arrival date & time: 01/18/17  0012  By signing my name below, I, Ny'Kea Lewis, attest that this documentation has been prepared under the direction and in the presence of Delora Fuel, MD. Electronically Signed: Lise Auer, ED Scribe. 01/18/17. 3:30 AM.  History   Chief Complaint Chief Complaint  Patient presents with  . Abdominal Pain   HPI  HPI Comments: [name] is a 36 y.o. female with a history of chronic abdominal pain, fibroids, ovarian cyst, pancreatitis, and anemia,  who presents to the Emergency Department complaining of sudden onset, gradually worsening upper quadrant abdominal pain that radiates into her back that began four days ago, she rates the severity of her pain a 10. She notes associated nausea, vomiting, diarrhea, chills, and subjective fever. Pt reports she is having a "pancreatic flare up". She reports the last time this happen was four years ago. No treatment tried PTA. Denies diaphoresis. No other associated symptoms noted.   Past Medical History:  Diagnosis Date  . Anemia   . Chronic abdominal pain   . Fibroids   . Ovarian cyst   . Pancreatitis   . S/P laparoscopic cholecystectomy     Patient Active Problem List   Diagnosis Date Noted  . Smoker 03/21/2014  . Nausea vomiting, diarrhea & abdominal pain 12/03/2012  . Transaminitis 12/03/2012  . Acute pancreatitis 09/06/2011  . Nonspecific elevation of levels of transaminase or lactic acid dehydrogenase (LDH) 09/06/2011  . Nausea & vomiting 08/10/2011  . ANEMIA-NOS 10/28/2009  . MIGRAINE HEADACHE 10/28/2009  . GERD 10/28/2009  . UTI (urinary tract infection) 10/28/2009  . CHICKENPOX, HX OF 10/28/2009  . 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 INSOMNIA 10/16/2007  . ABDOMINAL PAIN 10/08/2007  . VOMITING 09/29/2007  . PANCREATITIS, CHRONIC 09/26/2007  . CHOLECYSTECTOMY, HX OF 04/13/2007  . SYMPTOM, PAIN, ABDOMINAL, RIGHT UP QUADRANT  02/14/2007  . TOTAL HYSTERECTOMY AND BILATERAL SALPINGOOPHERECTOMY, HX OF 02/14/2007  . DEPRESSION, MAJOR, RECURRENT 09/01/2006  . RHINITIS, ALLERGIC 09/01/2006  . Unspecified asthma(493.90) 09/01/2006   Past Surgical History:  Procedure Laterality Date  . CHOLECYSTECTOMY    . DILATION AND CURETTAGE OF UTERUS    . ERCP W/ METAL STENT PLACEMENT    . OOPHORECTOMY     OB History    Gravida Para Term Preterm AB Living   '4 2 1 1 2     ' SAB TAB Ectopic Multiple Live Births   2             Home Medications    Prior to Admission medications   Medication Sig Start Date End Date Taking? Authorizing Provider  SUBOXONE 8-2 MG FILM Place 1 Film under the tongue 2 (two) times daily. 12/21/16  Yes [provider]  fluconazole (DIFLUCAN) 150 MG tablet 1 tab po x 1. May repeat in 72 hours if no improvement Patient not taking: Reported on 03/03/2015 01/24/15   Janne Napoleon, NP  lidocaine (XYLOCAINE) 2 % solution Use as directed 20 mLs in the mouth or throat as needed for mouth pain. Patient not taking: Reported on 03/03/2015 01/23/15   Delsa Grana, PA-C  metoCLOPramide (REGLAN) 10 MG tablet Take 1 tablet (10 mg total) by mouth every 6 (six) hours as needed for nausea or vomiting. Patient not taking: Reported on 03/03/2015 01/14/15   Molpus, Jenny Reichmann, MD  metroNIDAZOLE (FLAGYL) 500 MG tablet Take 1 tablet (500 mg total) by mouth 2 (two) times daily. X 7 days Patient not taking:  Reported on 03/03/2015 01/24/15   Janne Napoleon, NP  Miconazole Nitrate-Wipes (MONISTAT 7 COMPLETE THERAPY) 100-2 MG-% KIT Apply intravaginal cream every night for 7 days Patient not taking: Reported on 03/03/2015 02/03/15   Lance Bosch, NP  nystatin-triamcinolone [facility] II) cream Apply to affected area daily Patient not taking: Reported on 03/03/2015 01/26/15   Ashley Murrain, NP   Family History Family History  Problem Relation Age of Onset  . Diabetes Mother   . Hypertension Mother   . Hypertension Father   . Diabetes  Father   . Asthma Daughter    Social History Social History  Substance Use Topics  . Smoking status: Former Smoker    Packs/day: 0.25    Years: 0.00    Types: Cigarettes    Quit date: 06/01/2014  . Smokeless tobacco: Never Used  . Alcohol use No   Allergies   Celecoxib; Coconut flavor; Fentanyl; Ibuprofen; Ketorolac tromethamine; Meperidine hcl; Morphine; Ondansetron; Other; Shellfish allergy; Propoxycaine; Protonix [pantoprazole sodium]; Robitussin (alcohol free) [guaifenesin]; Aspirin; Contrast media [iodinated diagnostic agents]; Ketamine; and Propoxyphene n-acetaminophen  Review of Systems Review of Systems  Constitutional: Positive for chills and fever. Negative for diaphoresis.  Gastrointestinal: Positive for abdominal pain, diarrhea, nausea and vomiting.     Physical Exam Updated Vital Signs BP (!) 125/101 (BP Location: Right Arm)   Pulse 88   Temp 97.9 F (36.6 C) (Oral)   Resp 18   Ht '5\' 5"'  (1.651 m)   Wt 141 lb (64 kg)   LMP 05/07/2014   SpO2 100%   BMI 23.46 kg/m   Physical Exam  Constitutional: She is oriented to person, place, and time. She appears well-developed and well-nourished.  HENT:  Head: Normocephalic and atraumatic.  Eyes: Pupils are equal, round, and reactive to light. EOM are normal.  Neck: Normal range of motion. Neck supple. No JVD present.  Cardiovascular: Normal rate, regular rhythm and normal heart sounds.   No murmur heard. Pulmonary/Chest: Effort normal and breath sounds normal. She has no wheezes. She has no rales. She exhibits no tenderness.  Abdominal: Soft. Bowel sounds are normal. She exhibits no distension and no mass. There is tenderness. There is no rebound and no guarding.  Moderate tenderness diffusely. Bowel sounds decreased.   Musculoskeletal: Normal range of motion. She exhibits no edema.  Lymphadenopathy:    She has no cervical adenopathy.  Neurological: She is alert and oriented to person, place, and time. No cranial  nerve deficit. She exhibits normal muscle tone. Coordination normal.  Skin: Skin is warm and dry. No rash noted.  Psychiatric: She has a normal mood and affect. Her behavior is normal. Judgment and thought content normal.  Nursing note and vitals reviewed.  ED Treatments / Results  DIAGNOSTIC STUDIES: Oxygen Saturation is 100% on RA, normal by my interpretation.   COORDINATION OF CARE: 3:02 AM-Discussed next steps with pt. Pt verbalized understanding and is agreeable with the plan.   Labs (all labs ordered are listed, but only abnormal results are displayed) Labs Reviewed  COMPREHENSIVE METABOLIC PANEL - Abnormal; Notable for the following:       Result Value   Potassium 3.4 (*)    AST 45 (*)    ALT 75 (*)    Alkaline Phosphatase 151 (*)    All other components within normal limits  URINALYSIS, ROUTINE W REFLEX MICROSCOPIC - Abnormal; Notable for the following:    APPearance CLOUDY (*)    Specific Gravity, Urine 1.040 (*)  Hgb urine dipstick LARGE (*)    Ketones, ur 20 (*)    Protein, ur 30 (*)    Leukocytes, UA MODERATE (*)    Squamous Epithelial / LPF 0-5 (*)    All other components within normal limits  LIPASE, BLOOD  CBC  PREGNANCY, URINE  RAPID URINE DRUG SCREEN, HOSP PERFORMED  PREGNANCY, URINE  RAPID URINE DRUG SCREEN, HOSP PERFORMED   Procedures Procedures (including critical care time)  Medications Ordered in ED Medications  acetaminophen (TYLENOL) tablet 650 mg (650 mg Oral Refused 01/18/17 0205)  metoCLOPramide (REGLAN) injection 10 mg (10 mg Intramuscular Given 01/18/17 0201)  diphenhydrAMINE (BENADRYL) injection 25 mg (25 mg Intramuscular Given 01/18/17 0201)   Initial Impression / Assessment and Plan / ED Course  I have reviewed the triage vital signs and the nursing notes.  Pertinent lab results that were available during my care of the patient were reviewed by me and considered in my medical decision making (see chart for details).  Abdominal pain,  nausea, vomiting. Old records are reviewed, and she has a care plan because of history of drug seeking behavior. History of chronic abdominal pain and has been kicked out of several pain clinics. However, it has been several years since she has been in any ED for abdominal pain and vomiting. Screening labs are obtained and she is started on normal saline. Initial dose of metoclopramide did not give her significant relief of nausea. She is given promethazine with good relief of nausea. She was complaining of pain and wanted medication for pain, but she slept following the promethazine. After workup is unremarkable. At this point, she is asking to go home. She is discharged with prescription for promethazine. Follow-up with PCP.  Final Clinical Impressions(s) / ED Diagnoses   Final diagnoses:  Generalized abdominal pain  Non-intractable vomiting with nausea, unspecified vomiting type    New Prescriptions New Prescriptions   PROMETHAZINE (PHENERGAN) 25 MG TABLET    Take 1 tablet (25 mg total) by mouth every 6 (six) hours as needed for nausea or vomiting.   I personally performed the services described in this documentation, which was scribed in my presence. The recorded information has been reviewed and is accurate.       Delora Fuel, MD 35/94/09 [phone]

## 2017-01-18 NOTE — ED Notes (Signed)
Pt informed that urine is needed, states not able now but will try again.

## 2017-01-18 NOTE — ED Notes (Signed)
IV insertion attempted without success, another RN will attempt.

## 2017-03-24 ENCOUNTER — Inpatient Hospital Stay (HOSPITAL_COMMUNITY)
Admission: AD | Admit: 2017-03-24 | Discharge: 2017-03-24 | Disposition: A | Discharge status: Home / Self Care | Payer: Medicaid Other | Source: Ambulatory Visit | Attending: Obstetrics and Gynecology | Admitting: Obstetrics and Gynecology

## 2017-03-24 ENCOUNTER — Encounter (HOSPITAL_COMMUNITY): Payer: Self-pay | Admitting: *Deleted

## 2017-03-24 DIAGNOSIS — K861 Other chronic pancreatitis: Secondary | ICD-10-CM | POA: Insufficient documentation

## 2017-03-24 DIAGNOSIS — Z3202 Encounter for pregnancy test, result negative: Secondary | ICD-10-CM | POA: Insufficient documentation

## 2017-03-24 DIAGNOSIS — R109 Unspecified abdominal pain: Secondary | ICD-10-CM | POA: Insufficient documentation

## 2017-03-24 DIAGNOSIS — Z87891 Personal history of nicotine dependence: Secondary | ICD-10-CM | POA: Diagnosis not present

## 2017-03-24 LAB — URINALYSIS, ROUTINE W REFLEX MICROSCOPIC
BILIRUBIN URINE: NEGATIVE
GLUCOSE, UA: NEGATIVE mg/dL
HGB URINE DIPSTICK: NEGATIVE
KETONES UR: NEGATIVE mg/dL
Leukocytes, UA: NEGATIVE
Nitrite: NEGATIVE
PH: 7 (ref 5.0–8.0)
Protein, ur: NEGATIVE mg/dL
SPECIFIC GRAVITY, URINE: 1.019 (ref 1.005–1.030)

## 2017-03-24 LAB — POCT PREGNANCY, URINE: Preg Test, Ur: NEGATIVE

## 2017-03-24 NOTE — MAU Note (Signed)
Pt seen @ MD office the beginning of September, had pancreatitis with stent which was later removed.  Continues to have abdominal pain & swelling.  States her MD advised her to come to [facility] if the pain continues so she could get an U/S, thinks it might be GYN.   Denies bleeding.

## 2017-03-24 NOTE — MAU Provider Note (Signed)
abdom pain, hx pancreatitis  Chief Complaint: Abdominal Pain   None     SUBJECTIVE HPI: [name] is a 36 y.o. [phone] who presents to maternity admissions reporting intermittent abdominal cramping x 2 months. She has chronic pancreatitis and a stent was placed by her primary care doctor and symptoms have improved. She believes the pain she has now is something other than pancreatitis.  Her PCP also believes this is not pancreatitis and recommended she seek care at [facility] for her intermittent upper and lower abdominal pain in case it is Gyn in origin. She denies any associated symptoms. She has mild intermittent pain today unchanged for the last 3 months.  She denies vaginal bleeding, vaginal itching/burning, urinary symptoms, h/a, dizziness, n/v, or fever/chills.     HPI  Past Medical History:  Diagnosis Date  . Anemia   . Chronic abdominal pain   . Fibroids   . Ovarian cyst   . Pancreatitis   . S/P laparoscopic cholecystectomy    Past Surgical History:  Procedure Laterality Date  . CHOLECYSTECTOMY    . DILATION AND CURETTAGE OF UTERUS    . ERCP W/ METAL STENT PLACEMENT    . OOPHORECTOMY     Social History   Social History  . Marital status: Single    Spouse name: N/A  . Number of children: N/A  . Years of education: N/A   Occupational History  . stay at home mom [place]   Social History Main Topics  . Smoking status: Former Smoker    Packs/day: 0.25    Years: 0.00    Types: Cigarettes    Quit date: 06/01/2014  . Smokeless tobacco: Never Used  . Alcohol use No  . Drug use: Yes    Types: Marijuana  . Sexual activity: Not on file   Other Topics Concern  . Not on file   Social History Narrative  . No narrative on file   No current facility-administered medications on file prior to encounter.    Current Outpatient Prescriptions on File Prior to Encounter  Medication Sig Dispense Refill  . promethazine (PHENERGAN) 25 MG  tablet Take 1 tablet (25 mg total) by mouth every 6 (six) hours as needed for nausea or vomiting. 30 tablet 0  . SUBOXONE 8-2 MG FILM Place 1 Film under the tongue 2 (two) times daily.  0   Allergies  Allergen Reactions  . Celecoxib Anaphylaxis and Swelling  . Coconut Flavor Shortness Of Breath and Swelling  . Fentanyl Anaphylaxis and Rash  . Ibuprofen Anaphylaxis and Rash  . Ketorolac Tromethamine Anaphylaxis  . Meperidine Hcl Anaphylaxis  . Morphine Anaphylaxis and Rash  . Ondansetron Anaphylaxis  . Other Shortness Of Breath and Swelling    GI cocktail  . Shellfish Allergy Anaphylaxis    Crab and lobster  . Propoxycaine Other (See Comments)    Throat swelling  . Protonix [Pantoprazole Sodium]     Throat Swelling.   . Robitussin (Alcohol Free) [Guaifenesin] Itching  . Aspirin Rash  . Contrast Media [Iodinated Diagnostic Agents] Rash  . Ketamine Itching  . Propoxyphene N-Acetaminophen Rash    ROS:  Review of Systems  Constitutional: Negative for chills, fatigue and fever.  Respiratory: Negative for shortness of breath.   Cardiovascular: Negative for chest pain.  Gastrointestinal: Positive for abdominal pain. Negative for nausea and vomiting.  Genitourinary: Positive for pelvic pain. Negative for difficulty urinating, dysuria, flank pain, vaginal bleeding, vaginal discharge and vaginal pain.  Musculoskeletal: Negative for back pain.  Neurological: Negative for dizziness and headaches.  Psychiatric/Behavioral: Negative.      I have reviewed patient's Past Medical Hx, Surgical Hx, Family Hx, Social Hx, medications and allergies.   Physical Exam  Patient Vitals for the past 24 hrs:  BP Temp Temp src Pulse Resp Height Weight  03/24/17 1442 110/84 98.1 F (36.7 C) Oral 76 18 5\' 5"  (1.651 m) 149 lb (67.6 kg)   Constitutional: Well-developed, well-nourished female in no acute distress.  Cardiovascular: normal rate Respiratory: normal effort GI: Abd soft, non-tender. Pos BS  x 4 MS: Extremities nontender, no edema, normal ROM Neurologic: Alert and oriented x 4.  GU: Neg CVAT.  PELVIC EXAM: Deferred  LAB RESULTS Results for orders placed or performed during the hospital encounter of 03/24/17 (from the past 24 hour(s))  Urinalysis, Routine w reflex microscopic     Status: None   Collection Time: 03/24/17 12:49 PM  Result Value Ref Range   Color, Urine YELLOW YELLOW   APPearance CLEAR CLEAR   Specific Gravity, Urine 1.019 1.005 - 1.030   pH 7.0 5.0 - 8.0   Glucose, UA NEGATIVE NEGATIVE mg/dL   Hgb urine dipstick NEGATIVE NEGATIVE   Bilirubin Urine NEGATIVE NEGATIVE   Ketones, ur NEGATIVE NEGATIVE mg/dL   Protein, ur NEGATIVE NEGATIVE mg/dL   Nitrite NEGATIVE NEGATIVE   Leukocytes, UA NEGATIVE NEGATIVE  Pregnancy, urine POC     Status: None   Collection Time: 03/24/17  3:44 PM  Result Value Ref Range   Preg Test, Ur NEGATIVE NEGATIVE   *Note: Due to a large number of results and/or encounters for the requested time period, some results have not been displayed. A complete set of results can be found in Results Review.       IMAGING No results found.  MAU Management/MDM: Ordered labs and reviewed results.  Called pt to triage from MAU waiting room to discuss options. Pt can wait for room to become available for emergency room evaluation or I offered outpatient follow up after my assessment and confirmation that her pain is chronic and there are no s/sx of acute process.  Pt accepts and outpatient pelvicd US ordered and pt to f/u in [facility] office for pelvic pain.  Precautions/reasons to return to MAU reviewed. Pt stable at time of discharge.  ASSESSMENT 1. Abdominal pain in female patient     PLAN Discharge home Allergies as of 03/24/2017      Reactions   Celecoxib Anaphylaxis, Swelling   Coconut Flavor Shortness Of Breath, Swelling   Fentanyl Anaphylaxis, Rash   Ibuprofen Anaphylaxis, Rash   Ketorolac Tromethamine Anaphylaxis   Meperidine Hcl  Anaphylaxis   Morphine Anaphylaxis, Rash   Ondansetron Anaphylaxis   Other Shortness Of Breath, Swelling   GI cocktail   Shellfish Allergy Anaphylaxis   Crab and lobster   Propoxycaine Other (See Comments)   Throat swelling   Protonix [pantoprazole Sodium]    Throat Swelling.    Robitussin (alcohol Free) [guaifenesin] Itching   Aspirin Rash   Contrast Media [iodinated Diagnostic Agents] Rash   Ketamine Itching   Propoxyphene N-acetaminophen Rash      Medication List    TAKE these medications   promethazine 25 MG tablet Commonly known as:  PHENERGAN Take 1 tablet (25 mg total) by mouth every 6 (six) hours as needed for nausea or vomiting.   SUBOXONE 8-2 MG Film Generic drug:  Buprenorphine HCl-Naloxone HCl Place 1 Film under the tongue 2 (  two) times daily.            Discharge Care Instructions        Start     Ordered   03/24/17 0000  Discharge patient    Question Answer Comment  Discharge disposition 01-Home or Self Care   Discharge patient date 03/24/2017      03/24/17 1737   03/24/17 0000  US PELVIS (TRANSABDOMINAL ONLY)    Question Answer Comment  Reason for exam: abdominal pain in female patient   [facility]      03/24/17 1737   03/24/17 0000  US PELVIS TRANSVANGINAL NON-OB (TV ONLY)    Question Answer Comment  Reason for exam: abdominal pain female patient   Preferred imaging location? [facility]      03/24/17 [place] for [place] Follow up.   Specialty:  Obstetrics and Gynecology Why:  The office and ultrasound will call you to schedule. Return to MAU as needed for emergencies.  Contact information: [place] Certified Nurse-Midwife 03/24/2017  5:43 PM

## 2017-04-04 ENCOUNTER — Ambulatory Visit (HOSPITAL_COMMUNITY): Payer: Medicaid Other

## 2017-04-04 ENCOUNTER — Ambulatory Visit (HOSPITAL_COMMUNITY)
Admission: RE | Admit: 2017-04-04 | Discharge: 2017-04-04 | Disposition: A | Discharge status: Home / Self Care | Payer: Medicaid Other | Source: Ambulatory Visit | Attending: Advanced Practice Midwife | Admitting: Advanced Practice Midwife

## 2017-04-04 DIAGNOSIS — R109 Unspecified abdominal pain: Secondary | ICD-10-CM | POA: Diagnosis not present

## 2017-04-05 ENCOUNTER — Other Ambulatory Visit (HOSPITAL_COMMUNITY): Payer: Medicaid Other

## 2017-04-12 ENCOUNTER — Telehealth: Payer: Self-pay | Admitting: General Practice

## 2017-04-12 NOTE — Telephone Encounter (Signed)
Called patient and informed her of results. Patient verbalized understanding and states she still hasn't been contacted with an appt for our office. Told patient I will let our front office staff know and someone will contact her with an appt. Patient verbalized understanding & had no questions

## 2017-04-12 NOTE — Telephone Encounter (Signed)
-----   Message from Berlinda Last, RTR sent at 04/04/2017  2:48 PM EDT ----- Regarding: [place] GYN results We have just completed a pelvic outpatient ultrasound scheduled for a patient who was seen in MAU.  Please call the patient with the results.

## 2017-04-18 ENCOUNTER — Encounter: Payer: Self-pay | Admitting: Obstetrics & Gynecology

## 2017-07-25 ENCOUNTER — Emergency Department (HOSPITAL_COMMUNITY): Payer: Medicaid Other

## 2017-07-25 ENCOUNTER — Other Ambulatory Visit: Payer: Self-pay

## 2017-07-25 ENCOUNTER — Emergency Department (HOSPITAL_COMMUNITY)
Admission: EM | Admit: 2017-07-25 | Discharge: 2017-07-25 | Disposition: A | Discharge status: Home / Self Care | Payer: Medicaid Other | Attending: Emergency Medicine | Admitting: Emergency Medicine

## 2017-07-25 ENCOUNTER — Encounter (HOSPITAL_COMMUNITY): Payer: Self-pay | Admitting: Emergency Medicine

## 2017-07-25 DIAGNOSIS — R05 Cough: Secondary | ICD-10-CM | POA: Diagnosis present

## 2017-07-25 DIAGNOSIS — B349 Viral infection, unspecified: Secondary | ICD-10-CM | POA: Insufficient documentation

## 2017-07-25 DIAGNOSIS — Z87891 Personal history of nicotine dependence: Secondary | ICD-10-CM | POA: Insufficient documentation

## 2017-07-25 DIAGNOSIS — Z79899 Other long term (current) drug therapy: Secondary | ICD-10-CM | POA: Diagnosis not present

## 2017-07-25 LAB — BASIC METABOLIC PANEL
Anion gap: 5 (ref 5–15)
BUN: 9 mg/dL (ref 6–20)
CO2: 26 mmol/L (ref 22–32)
CREATININE: 0.65 mg/dL (ref 0.44–1.00)
Calcium: 9.2 mg/dL (ref 8.9–10.3)
Chloride: 108 mmol/L (ref 101–111)
Glucose, Bld: 94 mg/dL (ref 65–99)
POTASSIUM: 4 mmol/L (ref 3.5–5.1)
SODIUM: 139 mmol/L (ref 135–145)

## 2017-07-25 LAB — CBC WITH DIFFERENTIAL/PLATELET
BASOS ABS: 0 10*3/uL (ref 0.0–0.1)
Basophils Relative: 0 %
EOS ABS: 0.2 10*3/uL (ref 0.0–0.7)
EOS PCT: 2 %
HCT: 42.4 % (ref 36.0–46.0)
Hemoglobin: 14.4 g/dL (ref 12.0–15.0)
LYMPHS PCT: 14 %
Lymphs Abs: 1.5 10*3/uL (ref 0.7–4.0)
MCH: 28.9 pg (ref 26.0–34.0)
MCHC: 34 g/dL (ref 30.0–36.0)
MCV: 85 fL (ref 78.0–100.0)
Monocytes Absolute: 0.9 10*3/uL (ref 0.1–1.0)
Monocytes Relative: 8 %
Neutro Abs: 8.2 10*3/uL — ABNORMAL HIGH (ref 1.7–7.7)
Neutrophils Relative %: 76 %
PLATELETS: 213 10*3/uL (ref 150–400)
RBC: 4.99 MIL/uL (ref 3.87–5.11)
RDW: 12.1 % (ref 11.5–15.5)
WBC: 10.8 10*3/uL — AB (ref 4.0–10.5)

## 2017-07-25 LAB — I-STAT BETA HCG BLOOD, ED (MC, WL, AP ONLY)

## 2017-07-25 LAB — TROPONIN I: Troponin I: <0.03 ng/mL (ref <0.03)

## 2017-07-25 LAB — D-DIMER, QUANTITATIVE: D-Dimer, Quant: 0.4 ug/mL-FEU (ref 0.00–0.50)

## 2017-07-25 MED ORDER — FLUTICASONE PROPIONATE 50 MCG/ACT NA SUSP: 1.0000 | Freq: Every day | NASAL | 2 refills | Status: DC

## 2017-07-25 MED ORDER — IBUPROFEN 800 MG PO TABS
800.0000 mg | ORAL_TABLET | Freq: Once | ORAL | Status: AC
  Administered 2017-07-25: 800 mg via ORAL
  Filled 2017-07-25: qty 1

## 2017-07-25 MED ORDER — IBUPROFEN 800 MG PO TABS: 800.0000 mg | ORAL_TABLET | Freq: Three times a day (TID) | ORAL | 0 refills | Status: DC

## 2017-07-25 NOTE — Discharge Instructions (Signed)
You were seen in the emergency today and diagnosed with a viral illness.  Your labs did not show any signs of infection, anemia, electrolyte abnormality, or problems with your kidney function.  The x-ray of your chest did not show signs of pneumonia.  Your EKG did not have any concerning findings.  I have prescribed you multiple medications to treat your symptoms.   -Flonase to be used 1 spray in each nostril daily.  This medication is used to treat your congestion.  -Ibuprofen to be taken once every 8 hours as needed for pain.  You will need to follow-up with your primary care provider in 1 week if your symptoms have not improved.  If you do not have a primary care provider one is provided in your discharge instructions.  Return to the emergency department for any new or worsening symptoms including but not limited to persistent fever for 5 days, difficulty breathing, chest pain, or passing out.

## 2017-07-25 NOTE — ED Triage Notes (Signed)
Per EMS pt complaint of flu like symptoms with cough and body aches. No n/v/d.

## 2017-07-25 NOTE — ED Provider Notes (Signed)
[place] DEPT Provider Note   CSN: [id number] Arrival date & time: 07/25/17  1456     History   Chief Complaint Chief Complaint  Patient presents with  . Flu Like Symptoms    HPI [name] is a 37 y.o. female who presents to the ED with complaints URI symptoms that started yesterday and have been progressively worsening. Reports congestion, rhinorrhea, and productive cough with yellow/green sputum. States she is having dyspnea with coughing. Reports she is also having some central chest discomfort that is sharp/dull/throbbing- states this is constant. Pain is worse with a deep breath, no other alleviating/aggravating factors. Has tried Mucinex, Nyquil, and Dayquil all without relief. Denies fever, chills, sore throat, or ear pain. Denies leg pain/swelling, hemoptysis, recent surgery/trauma, recent long travel, hormone use, personal hx of cancer, or hx of DVT/PE.   HPI  Past Medical History:  Diagnosis Date  . Anemia   . Chronic abdominal pain   . Fibroids   . Ovarian cyst   . Pancreatitis   . S/P laparoscopic cholecystectomy     Patient Active Problem List   Diagnosis Date Noted  . Smoker 03/21/2014  . Nausea vomiting, diarrhea & abdominal pain 12/03/2012  . Transaminitis 12/03/2012  . Acute pancreatitis 09/06/2011  . Nonspecific elevation of levels of transaminase or lactic acid dehydrogenase (LDH) 09/06/2011  . Nausea & vomiting 08/10/2011  . ANEMIA-NOS 10/28/2009  . MIGRAINE HEADACHE 10/28/2009  . GERD 10/28/2009  . UTI (urinary tract infection) 10/28/2009  . CHICKENPOX, HX OF 10/28/2009  . [place] SYNDROME 10/26/2007  . INSOMNIA 10/16/2007  . ABDOMINAL PAIN 10/08/2007  . VOMITING 09/29/2007  . PANCREATITIS, CHRONIC 09/26/2007  . CHOLECYSTECTOMY, HX OF 04/13/2007  . SYMPTOM, PAIN, ABDOMINAL, RIGHT UP QUADRANT 02/14/2007  . TOTAL HYSTERECTOMY AND BILATERAL SALPINGOOPHERECTOMY, HX OF 02/14/2007  . DEPRESSION,  MAJOR, RECURRENT 09/01/2006  . RHINITIS, ALLERGIC 09/01/2006  . Unspecified asthma(493.90) 09/01/2006    Past Surgical History:  Procedure Laterality Date  . CHOLECYSTECTOMY    . DILATION AND CURETTAGE OF UTERUS    . ERCP W/ METAL STENT PLACEMENT    . OOPHORECTOMY      OB History    Gravida Para Term Preterm AB Living   4 2 1 1 2      SAB TAB Ectopic Multiple Live Births   2               Home Medications    Prior to Admission medications   Medication Sig Start Date End Date Taking? Authorizing Provider  promethazine (PHENERGAN) 25 MG tablet Take 1 tablet (25 mg total) by mouth every 6 (six) hours as needed for nausea or vomiting. 10/22/35   Delora Fuel, MD  SUBOXONE 8-2 MG FILM Place 1 Film under the tongue 2 (two) times daily. 12/21/16   [provider]    Family History Family History  Problem Relation Age of Onset  . Diabetes Mother   . Hypertension Mother   . Hypertension Father   . Diabetes Father   . Asthma Daughter     Social History Social History   Tobacco Use  . Smoking status: Former Smoker    Packs/day: 0.25    Years: 0.00    Pack years: 0.00    Types: Cigarettes    Last attempt to quit: 06/01/2014    Years since quitting: 3.1  . Smokeless tobacco: Never Used  Substance Use Topics  . Alcohol use: No  . Drug use: Yes  Types: Marijuana     Allergies   Celecoxib; Coconut flavor; Fentanyl; Ibuprofen; Ketorolac tromethamine; Meperidine hcl; Morphine; Ondansetron; Other; Shellfish allergy; Propoxycaine; Protonix [pantoprazole sodium]; Robitussin (alcohol free) [guaifenesin]; Aspirin; Contrast media [iodinated diagnostic agents]; Ketamine; and Propoxyphene n-acetaminophen   Review of Systems Review of Systems  Constitutional: Negative for chills and fever.  HENT: Positive for congestion and rhinorrhea. Negative for ear pain and sore throat.   Respiratory: Positive for cough and shortness of breath (with coughing).   Cardiovascular:  Positive for chest pain. Negative for leg swelling.  Gastrointestinal: Negative for abdominal pain, diarrhea, nausea and vomiting.  Neurological: Negative for weakness and numbness.  All other systems reviewed and are negative.   Physical Exam Updated Vital Signs BP 116/82 (BP Location: Right Arm)   Pulse (!) 112   Temp 99.3 F (37.4 C) (Oral)   Resp 20   SpO2 95%   Physical Exam  Constitutional: She appears well-developed and well-nourished.  Non-toxic appearance. No distress.  HENT:  Head: Normocephalic and atraumatic.  Right Ear: Tympanic membrane is not perforated, not erythematous, not retracted and not bulging.  Left Ear: Tympanic membrane is not perforated, not erythematous, not retracted and not bulging.  Nose: Mucosal edema present. Right sinus exhibits no maxillary sinus tenderness and no frontal sinus tenderness. Left sinus exhibits no maxillary sinus tenderness and no frontal sinus tenderness.  Mouth/Throat: Uvula is midline and oropharynx is clear and moist. No oropharyngeal exudate or posterior oropharyngeal erythema.  Eyes: Conjunctivae are normal. Pupils are equal, round, and reactive to light. Right eye exhibits no discharge. Left eye exhibits no discharge.  Neck: Normal range of motion. Neck supple.  Cardiovascular: Normal rate and regular rhythm.  No murmur heard. Pulses:      Radial pulses are 2+ on the right side, and 2+ on the left side.  Pulmonary/Chest: Breath sounds normal. No respiratory distress. She has no wheezes. She has no rales.  Abdominal: Soft. She exhibits no distension. There is no tenderness.  Musculoskeletal: She exhibits no edema or tenderness.  Lymphadenopathy:    She has no cervical adenopathy.  Neurological: She is alert.  Skin: Skin is warm and dry. No rash noted.  Psychiatric: She has a normal mood and affect. Her behavior is normal.  Nursing note and vitals reviewed.  ED Treatments / Results  Labs Results for orders placed or  performed during the hospital encounter of 34/19/62  Basic metabolic panel  Result Value Ref Range   Sodium 139 135 - 145 mmol/L   Potassium 4.0 3.5 - 5.1 mmol/L   Chloride 108 101 - 111 mmol/L   CO2 26 22 - 32 mmol/L   Glucose, Bld 94 65 - 99 mg/dL   BUN 9 6 - 20 mg/dL   Creatinine, Ser 0.65 0.44 - 1.00 mg/dL   Calcium 9.2 8.9 - 10.3 mg/dL   GFR calc non Af Amer >60 >60 mL/min   GFR calc Af Amer >60 >60 mL/min   Anion gap 5 5 - 15  CBC with Differential  Result Value Ref Range   WBC 10.8 (H) 4.0 - 10.5 K/uL   RBC 4.99 3.87 - 5.11 MIL/uL   Hemoglobin 14.4 12.0 - 15.0 g/dL   HCT 42.4 36.0 - 46.0 %   MCV 85.0 78.0 - 100.0 fL   MCH 28.9 26.0 - 34.0 pg   MCHC 34.0 30.0 - 36.0 g/dL   RDW 12.1 11.5 - 15.5 %   Platelets 213 150 - 400 K/uL  Neutrophils Relative % 76 %   Neutro Abs 8.2 (H) 1.7 - 7.7 K/uL   Lymphocytes Relative 14 %   Lymphs Abs 1.5 0.7 - 4.0 K/uL   Monocytes Relative 8 %   Monocytes Absolute 0.9 0.1 - 1.0 K/uL   Eosinophils Relative 2 %   Eosinophils Absolute 0.2 0.0 - 0.7 K/uL   Basophils Relative 0 %   Basophils Absolute 0.0 0.0 - 0.1 K/uL  D-dimer, quantitative  Result Value Ref Range   D-Dimer, Quant 0.40 0.00 - 0.50 ug/mL-FEU  Troponin I  Result Value Ref Range   Troponin I <0.03 <0.03 ng/mL  I-Stat Beta hCG blood, ED ([facility], [facility], [facility] only)  Result Value Ref Range   I-stat hCG, quantitative <5.0 <5 mIU/mL   Comment 3            EKG  EKG Interpretation None       Radiology Dg Chest 2 View  Result Date: 07/25/2017 CLINICAL DATA:  Flu-like symptoms for 1 day, cough and body aches. EXAM: CHEST  2 VIEW COMPARISON:  Chest radiograph Nov 04, 2014 FINDINGS: Cardiomediastinal silhouette is normal. No pleural effusions or focal consolidations. Trachea projects midline and there is no pneumothorax. Soft tissue planes and included osseous structures are non-suspicious. Surgical clips in the included right abdomen compatible with cholecystectomy. IMPRESSION: Normal  chest. Electronically Signed   By: Elon Alas M.D.   On: 07/25/2017 17:19    Procedures Procedures (including critical care time)  Medications Ordered in ED Medications  ibuprofen (ADVIL,MOTRIN) tablet 800 mg (800 mg Oral Given 07/25/17 1851)    Initial Impression / Assessment and Plan / ED Course  I have reviewed the triage vital signs and the nursing notes.  Pertinent labs & imaging results that were available during my care of the patient were reviewed by me and considered in my medical decision making (see chart for details).  Patient presents with URI symptoms and additional complaint of chest pain that is worse with coughing and with deep breaths. She is nontoxic appearing, she is afebrile, of note initially tachycardic, this improved.  Given chest discomfort will evaluate with basic lab work including troponin as well as a d-dimer, EKG, and chest x-ray.  Lab work grossly unremarkable.  D-dimer negative-doubt PE pulmonary embolism.  Troponin negative after several hours of pain, EKG without significant ST or T wave changes, pain is nonexertional, doubt ACS.  There is no widening of the mediastinum on chest x-ray, patient does not describe the pain as a tearing, radial pulses are 2+ and symmetric, doubt dissection.  Discussed results with patient, she continues to have chest discomfort. Discussed with patient that ibuprofen is on her allergy list.  Patient states that she takes this often without difficulty.  She had a bottle of ibuprofen in her purse which she showed me that she takes without any allergic reaction.  Will treat with 800 mg ibuprofen in the emergency department.    Patient presents with symptoms consistent with viral illness. Patient is afebrile and without adventitious sounds on lung exam, CXR negative for infiltrate, doubt PNA. No wheezing on exam. Afebrile, no sinus tenderness, doubt sinusitis. Centor score 0, doubt strep pharyngitis. Suspect viral etiology, will  treat supportively with Ibuprofen and Flonase. I discussed results, treatment plan, need for PCP follow-up, and return precautions with the patient. Provided opportunity for questions, patient confirmed understanding and is in agreement with plan.    Final Clinical Impressions(s) / ED Diagnoses   Final diagnoses:  Viral illness    ED Discharge Orders        Ordered    fluticasone (FLONASE) 50 MCG/ACT nasal spray  Daily     07/25/17 1850    ibuprofen (ADVIL,MOTRIN) 800 MG tablet  3 times daily     07/25/17 1850       Mairely Foxworth, Glynda Jaeger, PA-C 07/25/17 2302    Dorie Rank, MD 07/26/17 [phone]

## 2017-08-20 ENCOUNTER — Encounter (HOSPITAL_COMMUNITY): Payer: Self-pay

## 2017-08-20 ENCOUNTER — Inpatient Hospital Stay (HOSPITAL_COMMUNITY)
Admission: AD | Admit: 2017-08-20 | Discharge: 2017-08-20 | Disposition: A | Discharge status: Home / Self Care | Payer: Medicaid Other | Source: Ambulatory Visit | Attending: Obstetrics and Gynecology | Admitting: Obstetrics and Gynecology

## 2017-08-20 DIAGNOSIS — N760 Acute vaginitis: Secondary | ICD-10-CM

## 2017-08-20 DIAGNOSIS — G8929 Other chronic pain: Secondary | ICD-10-CM | POA: Insufficient documentation

## 2017-08-20 DIAGNOSIS — Z885 Allergy status to narcotic agent status: Secondary | ICD-10-CM | POA: Insufficient documentation

## 2017-08-20 DIAGNOSIS — Z886 Allergy status to analgesic agent status: Secondary | ICD-10-CM | POA: Diagnosis not present

## 2017-08-20 DIAGNOSIS — Z91013 Allergy to seafood: Secondary | ICD-10-CM | POA: Insufficient documentation

## 2017-08-20 DIAGNOSIS — Z833 Family history of diabetes mellitus: Secondary | ICD-10-CM | POA: Diagnosis not present

## 2017-08-20 DIAGNOSIS — R102 Pelvic and perineal pain: Secondary | ICD-10-CM | POA: Diagnosis not present

## 2017-08-20 DIAGNOSIS — Z87891 Personal history of nicotine dependence: Secondary | ICD-10-CM | POA: Diagnosis not present

## 2017-08-20 DIAGNOSIS — Z9889 Other specified postprocedural states: Secondary | ICD-10-CM | POA: Diagnosis not present

## 2017-08-20 DIAGNOSIS — N898 Other specified noninflammatory disorders of vagina: Secondary | ICD-10-CM

## 2017-08-20 DIAGNOSIS — Z9049 Acquired absence of other specified parts of digestive tract: Secondary | ICD-10-CM | POA: Diagnosis not present

## 2017-08-20 DIAGNOSIS — B9689 Other specified bacterial agents as the cause of diseases classified elsewhere: Secondary | ICD-10-CM

## 2017-08-20 DIAGNOSIS — Z825 Family history of asthma and other chronic lower respiratory diseases: Secondary | ICD-10-CM | POA: Insufficient documentation

## 2017-08-20 DIAGNOSIS — M549 Dorsalgia, unspecified: Secondary | ICD-10-CM | POA: Diagnosis not present

## 2017-08-20 DIAGNOSIS — Z8249 Family history of ischemic heart disease and other diseases of the circulatory system: Secondary | ICD-10-CM | POA: Insufficient documentation

## 2017-08-20 DIAGNOSIS — Z91041 Radiographic dye allergy status: Secondary | ICD-10-CM | POA: Diagnosis not present

## 2017-08-20 LAB — URINALYSIS, ROUTINE W REFLEX MICROSCOPIC
Bacteria, UA: NONE SEEN
Bilirubin Urine: NEGATIVE
GLUCOSE, UA: NEGATIVE mg/dL
Ketones, ur: NEGATIVE mg/dL
Leukocytes, UA: NEGATIVE
NITRITE: NEGATIVE
PH: 5 (ref 5.0–8.0)
PROTEIN: NEGATIVE mg/dL
SPECIFIC GRAVITY, URINE: 1.025 (ref 1.005–1.030)

## 2017-08-20 LAB — WET PREP, GENITAL
SPERM: NONE SEEN
Trich, Wet Prep: NONE SEEN
WBC WET PREP: NONE SEEN
Yeast Wet Prep HPF POC: NONE SEEN

## 2017-08-20 LAB — POCT PREGNANCY, URINE: Preg Test, Ur: NEGATIVE

## 2017-08-20 MED ORDER — METRONIDAZOLE 500 MG PO TABS: 500.0000 mg | ORAL_TABLET | Freq: Two times a day (BID) | ORAL | 0 refills | Status: DC

## 2017-08-20 NOTE — MAU Provider Note (Signed)
History   37 yo female in with c/o vaginal pain and discharge for over a week. States discharge is yellowish in color. Denies itching.  CSN: [id number]  Arrival date & time 08/20/17  1147   None     Chief Complaint  Patient presents with  . Back Pain  . Abdominal Pain  . Vaginal burning    HPI  Past Medical History:  Diagnosis Date  . Anemia   . Chronic abdominal pain   . Fibroids   . Ovarian cyst   . Pancreatitis   . S/P laparoscopic cholecystectomy     Past Surgical History:  Procedure Laterality Date  . CHOLECYSTECTOMY    . DILATION AND CURETTAGE OF UTERUS    . ERCP W/ METAL STENT PLACEMENT    . OOPHORECTOMY      Family History  Problem Relation Age of Onset  . Diabetes Mother   . Hypertension Mother   . Hypertension Father   . Diabetes Father   . Asthma Daughter     Social History   Tobacco Use  . Smoking status: Former Smoker    Packs/day: 0.25    Years: 0.00    Pack years: 0.00    Types: Cigarettes    Last attempt to quit: 06/01/2014    Years since quitting: 3.2  . Smokeless tobacco: Never Used  Substance Use Topics  . Alcohol use: No  . Drug use: Yes    Types: Marijuana    OB History    Gravida Para Term Preterm AB Living   4 2 1 1 2      SAB TAB Ectopic Multiple Live Births   2              Review of Systems  Constitutional: Negative.   HENT: Negative.   Eyes: Negative.   Respiratory: Negative.   Cardiovascular: Negative.   Gastrointestinal: Negative.   Endocrine: Negative.   Genitourinary: Positive for vaginal discharge and vaginal pain.  Musculoskeletal: Negative.   Skin: Negative.   Allergic/Immunologic: Negative.   Neurological: Negative.   Hematological: Negative.   Psychiatric/Behavioral: Negative.     Allergies  Celecoxib; Coconut flavor; Fentanyl; Ibuprofen; Ketorolac tromethamine; Meperidine hcl; Morphine; Ondansetron; Other; Shellfish allergy; Propoxycaine; Protonix [pantoprazole sodium]; Robitussin (alcohol free)  [guaifenesin]; Aspirin; Contrast media [iodinated diagnostic agents]; Ketamine; and Propoxyphene n-acetaminophen  Home Medications    Ht 5' 2.5" (1.588 m)   Wt 143 lb 8 oz (65.1 kg)   LMP 03/20/2017   BMI 25.83 kg/m   Physical Exam  Constitutional: She is oriented to person, place, and time. She appears well-developed and well-nourished.  HENT:  Head: Normocephalic.  Eyes: Pupils are equal, round, and reactive to light.  Neck: Normal range of motion.  Cardiovascular: Normal rate, regular rhythm, normal heart sounds and intact distal pulses.  Pulmonary/Chest: Effort normal and breath sounds normal.  Abdominal: Soft. Bowel sounds are normal.  Genitourinary: Vaginal discharge found.  Musculoskeletal: Normal range of motion.  Neurological: She is alert and oriented to person, place, and time. She has normal reflexes.  Skin: Skin is warm and dry.  Psychiatric: She has a normal mood and affect. Her behavior is normal. Judgment and thought content normal.    MAU Course  Procedures (including critical care time)  Labs Reviewed  WET PREP, GENITAL  URINALYSIS, ROUTINE W REFLEX MICROSCOPIC  GC/CHLAMYDIA PROBE AMP ([place]) NOT AT [facility]   No results found.   1. Vaginal pain   2. Vaginal discharge  MDM  VSS, abd soft and non tender, wet prep pos clue. Cultures obtained. Exam normal with exception of yellowish vag discharge. Will treat for BV with Flagyl

## 2017-08-20 NOTE — MAU Note (Signed)
Pt reports that she might have had a hysterectomy several years ago- is unsure of what they did.  Reports burning and stinging when using the bathroom and with intercourse. No bleeding.  Reports lower abdominal and back pain.

## 2017-08-20 NOTE — Discharge Instructions (Signed)

## 2017-08-23 LAB — GC/CHLAMYDIA PROBE AMP (~~LOC~~) NOT AT ARMC
CHLAMYDIA, DNA PROBE: NEGATIVE
Neisseria Gonorrhea: NEGATIVE

## 2017-08-30 ENCOUNTER — Other Ambulatory Visit: Payer: Self-pay

## 2017-08-30 ENCOUNTER — Encounter (HOSPITAL_COMMUNITY): Payer: Self-pay | Admitting: Emergency Medicine

## 2017-08-30 ENCOUNTER — Emergency Department (HOSPITAL_COMMUNITY)
Admission: EM | Admit: 2017-08-30 | Discharge: 2017-08-30 | Disposition: A | Discharge status: Home / Self Care | Payer: Medicaid Other | Attending: Physician Assistant | Admitting: Physician Assistant

## 2017-08-30 DIAGNOSIS — R112 Nausea with vomiting, unspecified: Secondary | ICD-10-CM | POA: Insufficient documentation

## 2017-08-30 DIAGNOSIS — R1084 Generalized abdominal pain: Secondary | ICD-10-CM | POA: Diagnosis not present

## 2017-08-30 DIAGNOSIS — R319 Hematuria, unspecified: Secondary | ICD-10-CM

## 2017-08-30 DIAGNOSIS — G8929 Other chronic pain: Secondary | ICD-10-CM | POA: Diagnosis not present

## 2017-08-30 DIAGNOSIS — Z87891 Personal history of nicotine dependence: Secondary | ICD-10-CM | POA: Insufficient documentation

## 2017-08-30 DIAGNOSIS — R109 Unspecified abdominal pain: Secondary | ICD-10-CM | POA: Diagnosis present

## 2017-08-30 DIAGNOSIS — Z79899 Other long term (current) drug therapy: Secondary | ICD-10-CM | POA: Diagnosis not present

## 2017-08-30 DIAGNOSIS — K859 Acute pancreatitis without necrosis or infection, unspecified: Secondary | ICD-10-CM | POA: Insufficient documentation

## 2017-08-30 DIAGNOSIS — Z9049 Acquired absence of other specified parts of digestive tract: Secondary | ICD-10-CM | POA: Insufficient documentation

## 2017-08-30 DIAGNOSIS — F329 Major depressive disorder, single episode, unspecified: Secondary | ICD-10-CM | POA: Insufficient documentation

## 2017-08-30 DIAGNOSIS — R197 Diarrhea, unspecified: Secondary | ICD-10-CM

## 2017-08-30 LAB — CBC WITH DIFFERENTIAL/PLATELET
Basophils Absolute: 0.1 10*3/uL (ref 0.0–0.1)
Basophils Relative: 1 %
EOS PCT: 8 %
Eosinophils Absolute: 0.7 10*3/uL (ref 0.0–0.7)
HCT: 41.8 % (ref 36.0–46.0)
Hemoglobin: 13.8 g/dL (ref 12.0–15.0)
Lymphocytes Relative: 28 %
Lymphs Abs: 2.3 10*3/uL (ref 0.7–4.0)
MCH: 28.3 pg (ref 26.0–34.0)
MCHC: 33 g/dL (ref 30.0–36.0)
MCV: 85.8 fL (ref 78.0–100.0)
Monocytes Absolute: 0.6 10*3/uL (ref 0.1–1.0)
Monocytes Relative: 7 %
Neutro Abs: 4.6 10*3/uL (ref 1.7–7.7)
Neutrophils Relative %: 56 %
PLATELETS: 238 10*3/uL (ref 150–400)
RBC: 4.87 MIL/uL (ref 3.87–5.11)
RDW: 11.8 % (ref 11.5–15.5)
WBC: 8.2 10*3/uL (ref 4.0–10.5)

## 2017-08-30 LAB — URINALYSIS, ROUTINE W REFLEX MICROSCOPIC
Bilirubin Urine: NEGATIVE
Glucose, UA: NEGATIVE mg/dL
KETONES UR: NEGATIVE mg/dL
Leukocytes, UA: NEGATIVE
Nitrite: NEGATIVE
PH: 6 (ref 5.0–8.0)
PROTEIN: 30 mg/dL — AB
Specific Gravity, Urine: 1.029 (ref 1.005–1.030)

## 2017-08-30 LAB — COMPREHENSIVE METABOLIC PANEL
ALBUMIN: 3.6 g/dL (ref 3.5–5.0)
ALT: 77 U/L — ABNORMAL HIGH (ref 14–54)
AST: 55 U/L — AB (ref 15–41)
Alkaline Phosphatase: 120 U/L (ref 38–126)
Anion gap: 10 (ref 5–15)
BUN: 15 mg/dL (ref 6–20)
CHLORIDE: 109 mmol/L (ref 101–111)
CO2: 20 mmol/L — AB (ref 22–32)
Calcium: 8.9 mg/dL (ref 8.9–10.3)
Creatinine, Ser: 0.63 mg/dL (ref 0.44–1.00)
GFR calc Af Amer: >60 mL/min (ref >60)
GLUCOSE: 87 mg/dL (ref 65–99)
POTASSIUM: 4 mmol/L (ref 3.5–5.1)
SODIUM: 139 mmol/L (ref 135–145)
Total Bilirubin: 0.7 mg/dL (ref 0.3–1.2)
Total Protein: 7.3 g/dL (ref 6.5–8.1)

## 2017-08-30 LAB — LIPASE, BLOOD: LIPASE: 77 U/L — AB (ref 11–51)

## 2017-08-30 MED ORDER — PROMETHAZINE HCL 25 MG PO TABS
12.5000 mg | ORAL_TABLET | Freq: Once | ORAL | Status: DC
  Filled 2017-08-30: qty 1

## 2017-08-30 MED ORDER — ONDANSETRON 4 MG PO TBDP
4.0000 mg | ORAL_TABLET | Freq: Once | ORAL | Status: AC
  Administered 2017-08-30: 4 mg via ORAL
  Filled 2017-08-30: qty 1

## 2017-08-30 MED ORDER — ACETAMINOPHEN 500 MG PO TABS
1000.0000 mg | ORAL_TABLET | Freq: Once | ORAL | Status: AC
  Administered 2017-08-30: 1000 mg via ORAL
  Filled 2017-08-30: qty 2

## 2017-08-30 NOTE — ED Triage Notes (Signed)
Pt reports pancreatitis flare up for 3 weeks with nausea, vomiting, diarr, and swollen abdomen. No signs of distress.

## 2017-08-30 NOTE — Discharge Instructions (Signed)
Your evaluation today is reassuring, your lipase is slightly elevated at 77, please make sure you are drinking plenty of fluids and follow-up with your primary care doctor regarding this.  Your analysis shows evidence of blood in the urine, this is been present on your last several visits, I would like for you to follow-up with urology regarding this hematuria.  If you have severe worsening abdominal pain, nausea vomiting and or unable to keep down fluids, develop blood in the vomit or diarrhea, fevers or chills or other concerning symptoms please return to the ED for reevaluation.

## 2017-08-30 NOTE — ED Provider Notes (Signed)
[facility] EMERGENCY DEPARTMENT Provider Note   CSN: [id number] Arrival date & time: 08/30/17  1010     History   Chief Complaint No chief complaint on file.   HPI  [name] is a 37 y.o. Female with a history of chronic abdominal pain, pancreatitis, fibroids, ovarian cysts and anemia, who presents to the ED today complaining of 3 weeks of abdominal pain, nausea, vomiting and diarrhea.  Patient was seen at the [facility] 10 days ago, complaining of pelvic pain and vaginal discharge but did not know any of the symptoms at this time.  She describes pain as constant aching. Patient reports she has had intermittent nausea and vomiting has been using her home antiemetics and has been able to keep down some food and fluids, no episodes of vomiting since arriving here.  Patient reports intermittent diarrhea, denies any blood in the stool or emesis.  Patient denies any dysuria, frequency hematuria or flank pain.  No fevers or chills.  Patient was not able to see her primary care doctor yet for the symptoms but does have an appointment tomorrow.  Patient has been seen many times for similar symptoms and does have a care plan in place, history of pain medication seeking behavior.      Past Medical History:  Diagnosis Date  . Anemia   . Chronic abdominal pain   . Fibroids   . Ovarian cyst   . Pancreatitis   . S/P laparoscopic cholecystectomy     Patient Active Problem List   Diagnosis Date Noted  . Smoker 03/21/2014  . Nausea vomiting, diarrhea & abdominal pain 12/03/2012  . Transaminitis 12/03/2012  . Acute pancreatitis 09/06/2011  . Nonspecific elevation of levels of transaminase or lactic acid dehydrogenase (LDH) 09/06/2011  . Nausea & vomiting 08/10/2011  . ANEMIA-NOS 10/28/2009  . MIGRAINE HEADACHE 10/28/2009  . GERD 10/28/2009  . UTI (urinary tract infection) 10/28/2009  . CHICKENPOX, HX OF 10/28/2009  . [place] SYNDROME 10/26/2007  . INSOMNIA  10/16/2007  . ABDOMINAL PAIN 10/08/2007  . VOMITING 09/29/2007  . PANCREATITIS, CHRONIC 09/26/2007  . CHOLECYSTECTOMY, HX OF 04/13/2007  . SYMPTOM, PAIN, ABDOMINAL, RIGHT UP QUADRANT 02/14/2007  . TOTAL HYSTERECTOMY AND BILATERAL SALPINGOOPHERECTOMY, HX OF 02/14/2007  . DEPRESSION, MAJOR, RECURRENT 09/01/2006  . RHINITIS, ALLERGIC 09/01/2006  . Unspecified asthma(493.90) 09/01/2006    Past Surgical History:  Procedure Laterality Date  . CHOLECYSTECTOMY    . DILATION AND CURETTAGE OF UTERUS    . ERCP W/ METAL STENT PLACEMENT    . OOPHORECTOMY      OB History    Gravida Para Term Preterm AB Living   4 2 1 1 2      SAB TAB Ectopic Multiple Live Births   2               Home Medications    Prior to Admission medications   Medication Sig Start Date End Date Taking? Authorizing Provider  albuterol (PROVENTIL HFA;VENTOLIN HFA) 108 (90 Base) MCG/ACT inhaler Inhale 2 puffs into the lungs every 6 (six) hours as needed for wheezing or shortness of breath.    [provider]  fluticasone (FLONASE) 50 MCG/ACT nasal spray Place 1 spray into both nostrils daily. 07/25/17   Petrucelli, Samantha R, PA-C  ibuprofen (ADVIL,MOTRIN) 600 MG tablet Take 600 mg by mouth every 8 (eight) hours as needed.    [provider]  metroNIDAZOLE (FLAGYL) 500 MG tablet Take 1 tablet (500 mg total) by mouth 2 (  two) times daily. 08/20/17   Keitha Butte, CNM  montelukast (SINGULAIR) 10 MG tablet Take 10 mg by mouth daily as needed (allergies).  06/30/17   [provider]    Family History Family History  Problem Relation Age of Onset  . Diabetes Mother   . Hypertension Mother   . Hypertension Father   . Diabetes Father   . Asthma Daughter     Social History Social History   Tobacco Use  . Smoking status: Former Smoker    Packs/day: 0.25    Years: 0.00    Pack years: 0.00    Types: Cigarettes    Last attempt to quit: 06/01/2014    Years since quitting: 3.2  .  Smokeless tobacco: Never Used  Substance Use Topics  . Alcohol use: No  . Drug use: Yes    Types: Marijuana     Allergies   Celecoxib; Coconut flavor; Fentanyl; Ketorolac tromethamine; Meperidine hcl; Morphine; Ondansetron; Other; Shellfish allergy; Propoxycaine; Protonix [pantoprazole sodium]; Robitussin (alcohol free) [guaifenesin]; Aspirin; Contrast media [iodinated diagnostic agents]; Ketamine; and Propoxyphene n-acetaminophen   Review of Systems Review of Systems  Constitutional: Negative for chills and fever.  HENT: Negative for congestion, rhinorrhea and sore throat.   Respiratory: Negative for cough and shortness of breath.   Cardiovascular: Negative for chest pain.  Gastrointestinal: Positive for abdominal pain, diarrhea, nausea and vomiting. Negative for blood in stool.  Genitourinary: Negative for dysuria, frequency, pelvic pain, vaginal bleeding, vaginal discharge and vaginal pain.  Musculoskeletal: Negative for arthralgias and myalgias.  Skin: Negative for color change and rash.  Neurological: Negative for dizziness, syncope and light-headedness.     Physical Exam Updated Vital Signs BP 102/79 (BP Location: Right Arm)   Pulse 78   Temp 98.5 F (36.9 C)   Resp 18   Ht 5\' 5"  (1.651 m)   Wt 68 kg (150 lb)   LMP 03/20/2017   SpO2 100%   BMI 24.96 kg/m   Physical Exam  Constitutional: She is oriented to person, place, and time. She appears well-developed and well-nourished. No distress.  Patient is calm and comfortable in no acute distress, playing games on her phone  HENT:  Head: Normocephalic and atraumatic.  Mouth/Throat: Oropharynx is clear and moist.  Eyes: Right eye exhibits no discharge. Left eye exhibits no discharge.  Cardiovascular: Normal rate, regular rhythm and normal heart sounds.  Pulmonary/Chest: Effort normal and breath sounds normal. No stridor. No respiratory distress. She has no wheezes. She has no rales.  Abdominal: Soft. Bowel sounds are  normal.  Abdomen is soft and nondistended, bowel sounds present in all quadrants, patient denotes generalized mild tenderness worse in the epigastrium, but there is no guarding no rebound tenderness and no peritoneal signs, no CVA tenderness.  Musculoskeletal: She exhibits no edema or deformity.  Neurological: She is alert and oriented to person, place, and time. Coordination normal.  Skin: Skin is warm and dry. Capillary refill takes less than 2 seconds. She is not diaphoretic.  Psychiatric: She has a normal mood and affect. Her behavior is normal.  Nursing note and vitals reviewed.    ED Treatments / Results  Labs (all labs ordered are listed, but only abnormal results are displayed) Labs Reviewed  LIPASE, BLOOD - Abnormal; Notable for the following components:      Result Value   Lipase 77 (*)    All other components within normal limits  COMPREHENSIVE METABOLIC PANEL - Abnormal; Notable for the following components:  CO2 20 (*)    AST 55 (*)    ALT 77 (*)    All other components within normal limits  URINALYSIS, ROUTINE W REFLEX MICROSCOPIC - Abnormal; Notable for the following components:   Hgb urine dipstick LARGE (*)    Protein, ur 30 (*)    Bacteria, UA RARE (*)    Squamous Epithelial / LPF 0-5 (*)    All other components within normal limits  CBC WITH DIFFERENTIAL/PLATELET    EKG  EKG Interpretation None       Radiology No results found.  Procedures Procedures (including critical care time)  Medications Ordered in ED Medications  acetaminophen (TYLENOL) tablet 1,000 mg (1,000 mg Oral Given 08/30/17 1205)  ondansetron (ZOFRAN-ODT) disintegrating tablet 4 mg (4 mg Oral Given 08/30/17 1214)     Initial Impression / Assessment and Plan / ED Course  I have reviewed the triage vital signs and the nursing notes.  Pertinent labs & imaging results that were available during my care of the patient were reviewed by me and considered in my medical decision making  (see chart for details).  Patient presents for evaluation of generalized abdominal, feels she is having a "flareup of her pancreatitis".  Patient has been seen many times in the past for similar symptoms and has had numerous abdominal pain evaluations, has also been evaluated by GI at [facility] and has PCP follow-up, has care plan in place history of pain med seeking behavior.  On evaluation vitals are stable patient appears comfortable in no acute distress.  Reports 3 weeks of symptoms, has been tolerating p.o. and food despite these things.  Has PCP follow-up appointment tomorrow.  Will get general labs, give Zofran for nausea and Tylenol for pain and p.o. challenge the patient.  Labs overall reassuring, lipase is slightly elevated at 77, CBC without leukocytosis and hemoglobin normal, no electrolyte derangements requiring intervention, kidney function is normal, liver enzymes slightly elevated but this appears to be the patient's baseline.  Urinalysis does show evidence of hematuria, but on review of prior labs this is been present on every evaluation, will have patient follow-up with urology for this, she is not currently complaining of any urinary symptoms and there are no evidence of infection on UA.  At this time patient is stable for discharge home, she has follow-up tomorrow.  Strict return precautions discussed.  Patient expresses understanding and is in agreement with plan.  Final Clinical Impressions(s) / ED Diagnoses   Final diagnoses:  Chronic abdominal pain  Nausea vomiting and diarrhea  Pancreatitis, recurrent [facility])    ED Discharge Orders    None       Jacqlyn Larsen, [place] 08/30/17 1422    Macarthur Critchley, MD 09/01/17 [phone]

## 2018-04-13 ENCOUNTER — Emergency Department (HOSPITAL_COMMUNITY)
Admission: EM | Admit: 2018-04-13 | Discharge: 2018-04-13 | Disposition: A | Discharge status: Home / Self Care | Payer: Medicaid Other | Attending: Emergency Medicine | Admitting: Emergency Medicine

## 2018-04-13 ENCOUNTER — Emergency Department (HOSPITAL_COMMUNITY): Payer: Medicaid Other

## 2018-04-13 ENCOUNTER — Encounter (HOSPITAL_COMMUNITY): Payer: Self-pay

## 2018-04-13 DIAGNOSIS — M545 Low back pain: Secondary | ICD-10-CM | POA: Insufficient documentation

## 2018-04-13 DIAGNOSIS — Y9389 Activity, other specified: Secondary | ICD-10-CM | POA: Insufficient documentation

## 2018-04-13 DIAGNOSIS — Y92410 Unspecified street and highway as the place of occurrence of the external cause: Secondary | ICD-10-CM | POA: Insufficient documentation

## 2018-04-13 DIAGNOSIS — Y999 Unspecified external cause status: Secondary | ICD-10-CM | POA: Insufficient documentation

## 2018-04-13 DIAGNOSIS — Z79899 Other long term (current) drug therapy: Secondary | ICD-10-CM | POA: Diagnosis not present

## 2018-04-13 DIAGNOSIS — Z87891 Personal history of nicotine dependence: Secondary | ICD-10-CM | POA: Diagnosis not present

## 2018-04-13 DIAGNOSIS — M25562 Pain in left knee: Secondary | ICD-10-CM | POA: Diagnosis not present

## 2018-04-13 MED ORDER — IBUPROFEN 800 MG PO TABS: 800.0000 mg | ORAL_TABLET | Freq: Three times a day (TID) | ORAL | 0 refills | Status: DC | PRN

## 2018-04-13 MED ORDER — METHOCARBAMOL 500 MG PO TABS: 500.0000 mg | ORAL_TABLET | Freq: Three times a day (TID) | ORAL | 0 refills | Status: DC | PRN
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
## 2018-04-13 NOTE — ED Notes (Signed)
Pt verbalized understanding of discharge instructions and denies any further questions at this time.   

## 2019-08-12 ENCOUNTER — Other Ambulatory Visit: Payer: Self-pay

## 2019-08-12 ENCOUNTER — Encounter (HOSPITAL_COMMUNITY): Payer: Self-pay | Admitting: *Deleted

## 2019-08-12 ENCOUNTER — Emergency Department (HOSPITAL_COMMUNITY)
Admission: EM | Admit: 2019-08-12 | Discharge: 2019-08-13 | Discharge status: Against Medical Advice | Payer: Medicaid Other | Attending: Emergency Medicine | Admitting: Emergency Medicine

## 2019-08-12 DIAGNOSIS — Z87891 Personal history of nicotine dependence: Secondary | ICD-10-CM | POA: Insufficient documentation

## 2019-08-12 DIAGNOSIS — R1010 Upper abdominal pain, unspecified: Secondary | ICD-10-CM

## 2019-08-12 DIAGNOSIS — R140 Abdominal distension (gaseous): Secondary | ICD-10-CM | POA: Insufficient documentation

## 2019-08-12 MED ORDER — HYDROMORPHONE HCL 1 MG/ML IJ SOLN
1.0000 mg | Freq: Once | INTRAMUSCULAR | Status: AC
  Administered 2019-08-13: 1 mg via INTRAVENOUS
  Filled 2019-08-12: qty 1

## 2019-08-12 MED ORDER — PROMETHAZINE HCL 25 MG/ML IJ SOLN
25.0000 mg | Freq: Once | INTRAMUSCULAR | Status: AC
  Administered 2019-08-13: 25 mg via INTRAVENOUS
  Filled 2019-08-12: qty 1
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
## 2019-08-12 NOTE — ED Triage Notes (Signed)
Pt c/o abdominal swelling, pain, n/v/d for about 3 weeks. Reports hx of pancreatitis. Denies fevers.

## 2019-08-13 LAB — URINALYSIS, ROUTINE W REFLEX MICROSCOPIC
Bacteria, UA: NONE SEEN
Bilirubin Urine: NEGATIVE
Glucose, UA: NEGATIVE mg/dL
Ketones, ur: NEGATIVE mg/dL
Leukocytes,Ua: NEGATIVE
Nitrite: NEGATIVE
Protein, ur: NEGATIVE mg/dL
Specific Gravity, Urine: 1.014 (ref 1.005–1.030)
pH: 6 (ref 5.0–8.0)

## 2019-08-13 LAB — CBC WITH DIFFERENTIAL/PLATELET
Abs Immature Granulocytes: 0.02 10*3/uL (ref 0.00–0.07)
Basophils Absolute: 0.1 10*3/uL (ref 0.0–0.1)
Basophils Relative: 1 %
Eosinophils Absolute: 1 10*3/uL — ABNORMAL HIGH (ref 0.0–0.5)
Eosinophils Relative: 11 %
HCT: 42.4 % (ref 36.0–46.0)
Hemoglobin: 14.1 g/dL (ref 12.0–15.0)
Immature Granulocytes: 0 %
Lymphocytes Relative: 40 %
Lymphs Abs: 3.9 10*3/uL (ref 0.7–4.0)
MCH: 29.5 pg (ref 26.0–34.0)
MCHC: 33.3 g/dL (ref 30.0–36.0)
MCV: 88.7 fL (ref 80.0–100.0)
Monocytes Absolute: 0.8 10*3/uL (ref 0.1–1.0)
Monocytes Relative: 8 %
Neutro Abs: 3.8 10*3/uL (ref 1.7–7.7)
Neutrophils Relative %: 40 %
Platelets: 251 10*3/uL (ref 150–400)
RBC: 4.78 MIL/uL (ref 3.87–5.11)
RDW: 11.9 % (ref 11.5–15.5)
WBC: 9.5 10*3/uL (ref 4.0–10.5)
nRBC: 0.2 % (ref 0.0–0.2)

## 2019-08-13 LAB — COMPREHENSIVE METABOLIC PANEL
ALT: 77 U/L — ABNORMAL HIGH (ref 0–44)
AST: 60 U/L — ABNORMAL HIGH (ref 15–41)
Albumin: 3.8 g/dL (ref 3.5–5.0)
Alkaline Phosphatase: 124 U/L (ref 38–126)
Anion gap: 13 (ref 5–15)
BUN: 18 mg/dL (ref 6–20)
CO2: 22 mmol/L (ref 22–32)
Calcium: 8.8 mg/dL — ABNORMAL LOW (ref 8.9–10.3)
Chloride: 106 mmol/L (ref 98–111)
Creatinine, Ser: 0.62 mg/dL (ref 0.44–1.00)
GFR calc Af Amer: >60 mL/min (ref >60)
GFR calc non Af Amer: >60 mL/min (ref >60)
Glucose, Bld: 95 mg/dL (ref 70–99)
Potassium: 3.8 mmol/L (ref 3.5–5.1)
Sodium: 141 mmol/L (ref 135–145)
Total Bilirubin: 0.2 mg/dL — ABNORMAL LOW (ref 0.3–1.2)
Total Protein: 6.8 g/dL (ref 6.5–8.1)

## 2019-08-13 LAB — LIPASE, BLOOD: Lipase: 22 U/L (ref 11–51)

## 2019-08-13 MED ORDER — DIPHENHYDRAMINE HCL 50 MG/ML IJ SOLN: 50.0000 mg | Freq: Once | INTRAMUSCULAR | Status: DC

## 2019-08-13 MED ORDER — DIPHENHYDRAMINE HCL 50 MG/ML IJ SOLN
12.5000 mg | Freq: Once | INTRAMUSCULAR | Status: AC
  Administered 2019-08-13: 12.5 mg via INTRAVENOUS
  Filled 2019-08-13: qty 1

## 2019-08-13 MED ORDER — DIPHENHYDRAMINE HCL 25 MG PO CAPS: 50.0000 mg | ORAL_CAPSULE | Freq: Once | ORAL | Status: DC

## 2019-08-13 MED ORDER — HYDROCORTISONE NA SUCCINATE PF 250 MG IJ SOLR
200.0000 mg | Freq: Once | INTRAMUSCULAR | Status: AC
  Administered 2019-08-13: 200 mg via INTRAVENOUS
  Filled 2019-08-13: qty 200

## 2019-08-13 MED ORDER — DIPHENHYDRAMINE HCL 12.5 MG/5ML PO ELIX: 12.5000 mg | ORAL_SOLUTION | Freq: Once | ORAL | Status: DC

## 2019-08-13 MED ORDER — DICYCLOMINE HCL 10 MG PO CAPS
10.0000 mg | ORAL_CAPSULE | Freq: Once | ORAL | Status: AC
  Administered 2019-08-13: 10 mg via ORAL
  Filled 2019-08-13: qty 1

## 2019-08-13 MED ORDER — HYDROMORPHONE HCL 1 MG/ML IJ SOLN
1.0000 mg | Freq: Once | INTRAMUSCULAR | Status: AC
  Administered 2019-08-13: 1 mg via INTRAVENOUS
  Filled 2019-08-13: qty 1

## 2019-08-13 NOTE — Discharge Instructions (Addendum)
As we discussed, you are leaving [place].  There is a possibility that your symptoms may worsen.  Given that your work-up is not complete, I cannot guarantee that there is not a life threatening event occurring.  Please know that you can return to the emergency department at anytime for further evaluation and management of your symptoms.  Continue to take your home Zofran as prescribed for nausea or vomiting.  Take Tylenol and ibuprofen for pain control.  Return to the emergency department if you have persistent vomiting, if you stop passing gas or passing stool, if you develop high fevers, black or bloody stools or vomiting, or other new, concerning symptoms.

## 2019-08-13 NOTE — ED Notes (Signed)
Reviewed paperwork with pt and pt verbalized understanding.

## 2019-08-13 NOTE — ED Notes (Signed)
Called lab in regards to pt pending lab results. Kim in lab is attempting to locate lab work.

## 2020-01-19 ENCOUNTER — Encounter (HOSPITAL_COMMUNITY): Payer: Self-pay

## 2020-01-19 ENCOUNTER — Emergency Department (HOSPITAL_COMMUNITY)
Admission: EM | Admit: 2020-01-19 | Discharge: 2020-01-19 | Disposition: A | Discharge status: Home / Self Care | Payer: Medicaid Other | Attending: Emergency Medicine | Admitting: Emergency Medicine

## 2020-01-19 DIAGNOSIS — J45909 Unspecified asthma, uncomplicated: Secondary | ICD-10-CM | POA: Diagnosis not present

## 2020-01-19 DIAGNOSIS — Z87891 Personal history of nicotine dependence: Secondary | ICD-10-CM | POA: Diagnosis not present

## 2020-01-19 DIAGNOSIS — Z7951 Long term (current) use of inhaled steroids: Secondary | ICD-10-CM | POA: Insufficient documentation

## 2020-01-19 DIAGNOSIS — R1010 Upper abdominal pain, unspecified: Secondary | ICD-10-CM | POA: Diagnosis not present

## 2020-01-19 DIAGNOSIS — R109 Unspecified abdominal pain: Secondary | ICD-10-CM | POA: Diagnosis present

## 2020-01-19 LAB — URINALYSIS, ROUTINE W REFLEX MICROSCOPIC
Bilirubin Urine: NEGATIVE
Glucose, UA: NEGATIVE mg/dL
Ketones, ur: NEGATIVE mg/dL
Leukocytes,Ua: NEGATIVE
Nitrite: NEGATIVE
Protein, ur: NEGATIVE mg/dL
RBC / HPF: >50 RBC/hpf — ABNORMAL HIGH (ref 0–5)
Specific Gravity, Urine: 1.025 (ref 1.005–1.030)
pH: 5 (ref 5.0–8.0)

## 2020-01-19 LAB — CBC
HCT: 41.7 % (ref 36.0–46.0)
Hemoglobin: 13.9 g/dL (ref 12.0–15.0)
MCH: 29 pg (ref 26.0–34.0)
MCHC: 33.3 g/dL (ref 30.0–36.0)
MCV: 86.9 fL (ref 80.0–100.0)
Platelets: 265 10*3/uL (ref 150–400)
RBC: 4.8 MIL/uL (ref 3.87–5.11)
RDW: 11.7 % (ref 11.5–15.5)
WBC: 8.5 10*3/uL (ref 4.0–10.5)
nRBC: 0 % (ref 0.0–0.2)

## 2020-01-19 LAB — COMPREHENSIVE METABOLIC PANEL
ALT: 48 U/L — ABNORMAL HIGH (ref 0–44)
AST: 37 U/L (ref 15–41)
Albumin: 3.7 g/dL (ref 3.5–5.0)
Alkaline Phosphatase: 137 U/L — ABNORMAL HIGH (ref 38–126)
Anion gap: 8 (ref 5–15)
BUN: 15 mg/dL (ref 6–20)
CO2: 22 mmol/L (ref 22–32)
Calcium: 8.9 mg/dL (ref 8.9–10.3)
Chloride: 109 mmol/L (ref 98–111)
Creatinine, Ser: 0.75 mg/dL (ref 0.44–1.00)
GFR calc Af Amer: >60 mL/min (ref >60)
GFR calc non Af Amer: >60 mL/min (ref >60)
Glucose, Bld: 107 mg/dL — ABNORMAL HIGH (ref 70–99)
Potassium: 4 mmol/L (ref 3.5–5.1)
Sodium: 139 mmol/L (ref 135–145)
Total Bilirubin: 0.4 mg/dL (ref 0.3–1.2)
Total Protein: 6.7 g/dL (ref 6.5–8.1)

## 2020-01-19 LAB — I-STAT BETA HCG BLOOD, ED (MC, WL, AP ONLY): I-stat hCG, quantitative: <5 m[IU]/mL (ref <5)

## 2020-01-19 LAB — LIPASE, BLOOD: Lipase: 51 U/L (ref 11–51)

## 2020-01-19 MED ORDER — SODIUM CHLORIDE 0.9% FLUSH
3.0000 mL | Freq: Once | INTRAVENOUS | Status: AC
  Administered 2020-01-19: 3 mL via INTRAVENOUS

## 2020-01-19 MED ORDER — DIPHENHYDRAMINE HCL 50 MG/ML IJ SOLN
25.0000 mg | Freq: Once | INTRAMUSCULAR | Status: AC
  Administered 2020-01-19: 25 mg via INTRAVENOUS
  Filled 2020-01-19: qty 1

## 2020-01-19 MED ORDER — HYDROMORPHONE HCL 1 MG/ML IJ SOLN
1.0000 mg | Freq: Once | INTRAMUSCULAR | Status: AC
  Administered 2020-01-19: 1 mg via INTRAVENOUS
  Filled 2020-01-19: qty 1

## 2020-01-19 NOTE — ED Provider Notes (Signed)
[facility] EMERGENCY DEPARTMENT Provider Note   CSN: [id number] Arrival date & time: 01/19/20  0436     History Chief Complaint  Patient presents with  . Abdominal Pain    Calynn Ferrero is a 39 y.o. female.  Patient is a 39 year old female who presents with abdominal pain.  She has a history of chronic abdominal pain.  She has had prior uterine fibroids, pancreatitis and is status post cholecystectomy.  She also had a prior celiac plexus block.  She said that she had a 4-day history of some increased pain in her upper abdomen.  She says it feels similar to her prior pancreatitis but a little bit worse today.  She denies any nausea or vomiting.  No fevers.  No change in bowels.  No urinary symptoms.  She is status post hysterectomy.  She reportedly saw her PCP yesterday and was advised to come here if her symptoms got any worse.        Past Medical History:  Diagnosis Date  . Anemia   . Chronic abdominal pain   . Fibroids   . Ovarian cyst   . Pancreatitis   . S/P laparoscopic cholecystectomy     Patient Active Problem List   Diagnosis Date Noted  . Smoker 03/21/2014  . Nausea vomiting, diarrhea & abdominal pain 12/03/2012  . Transaminitis 12/03/2012  . Acute pancreatitis 09/06/2011  . Nonspecific elevation of levels of transaminase or lactic acid dehydrogenase (LDH) 09/06/2011  . Nausea & vomiting 08/10/2011  . ANEMIA-NOS 10/28/2009  . MIGRAINE HEADACHE 10/28/2009  . GERD 10/28/2009  . UTI (urinary tract infection) 10/28/2009  . CHICKENPOX, HX OF 10/28/2009  . [place] SYNDROME 10/26/2007  . INSOMNIA 10/16/2007  . ABDOMINAL PAIN 10/08/2007  . VOMITING 09/29/2007  . PANCREATITIS, CHRONIC 09/26/2007  . CHOLECYSTECTOMY, HX OF 04/13/2007  . SYMPTOM, PAIN, ABDOMINAL, RIGHT UP QUADRANT 02/14/2007  . TOTAL HYSTERECTOMY AND BILATERAL SALPINGOOPHERECTOMY, HX OF 02/14/2007  . DEPRESSION, MAJOR, RECURRENT 09/01/2006  . RHINITIS, ALLERGIC  09/01/2006  . Unspecified asthma(493.90) 09/01/2006    Past Surgical History:  Procedure Laterality Date  . CHOLECYSTECTOMY    . DILATION AND CURETTAGE OF UTERUS    . ERCP W/ METAL STENT PLACEMENT    . OOPHORECTOMY       OB History    Gravida  4   Para  2   Term  1   Preterm  1   AB  2   Living        SAB  2   TAB      Ectopic      Multiple      Live Births              Family History  Problem Relation Age of Onset  . Diabetes Mother   . Hypertension Mother   . Hypertension Father   . Diabetes Father   . Asthma Daughter     Social History   Tobacco Use  . Smoking status: Former Smoker    Packs/day: 0.25    Years: 0.00    Pack years: 0.00    Types: Cigarettes    Quit date: 06/01/2014    Years since quitting: 5.6  . Smokeless tobacco: Never Used  Substance Use Topics  . Alcohol use: No  . Drug use: Yes    Types: Marijuana    Home Medications Prior to Admission medications   Medication Sig Start Date End Date Taking? Authorizing Provider  albuterol (PROVENTIL HFA;VENTOLIN  HFA) 108 (90 Base) MCG/ACT inhaler Inhale 2 puffs into the lungs every 6 (six) hours as needed for wheezing or shortness of breath.    [provider]  estradiol (ESTRACE) 2 MG tablet Take 2 mg by mouth daily. 07/12/19   [provider]  ibuprofen (ADVIL) 600 MG tablet Take 600 mg by mouth 3 (three) times daily. 01/03/20   [provider]  fluticasone (FLONASE) 50 MCG/ACT nasal spray Place 1 spray into both nostrils daily. Patient not taking: Reported on 08/13/2019 07/25/17 08/13/19  Petrucelli, Aldona Bar R, PA-C    Allergies    Celecoxib, Coconut flavor, Fentanyl, Ketorolac tromethamine, Meperidine hcl, Morphine, Ondansetron, Other, Shellfish allergy, Propoxycaine, Protonix [pantoprazole sodium], Robitussin (alcohol free) [guaifenesin], Aspirin, Contrast media [iodinated diagnostic agents], Ketamine, and Propoxyphene n-acetaminophen  Review of Systems     Review of Systems  Constitutional: Negative for chills, diaphoresis, fatigue and fever.  HENT: Negative for congestion, rhinorrhea and sneezing.   Eyes: Negative.   Respiratory: Negative for cough, chest tightness and shortness of breath.   Cardiovascular: Negative for chest pain and leg swelling.  Gastrointestinal: Positive for abdominal pain. Negative for blood in stool, diarrhea, nausea and vomiting.  Genitourinary: Negative for difficulty urinating, flank pain, frequency and hematuria.  Musculoskeletal: Negative for arthralgias and back pain.  Skin: Negative for rash.  Neurological: Negative for dizziness, speech difficulty, weakness, numbness and headaches.    Physical Exam Updated Vital Signs BP 103/70 (BP Location: Left Arm)   Pulse 79   Temp 97.9 F (36.6 C) (Oral)   Resp 18   LMP 03/20/2017   SpO2 97%   Physical Exam Constitutional:      Appearance: She is well-developed.  HENT:     Head: Normocephalic and atraumatic.  Eyes:     Pupils: Pupils are equal, round, and reactive to light.  Cardiovascular:     Rate and Rhythm: Normal rate and regular rhythm.     Heart sounds: Normal heart sounds.  Pulmonary:     Effort: Pulmonary effort is normal. No respiratory distress.     Breath sounds: Normal breath sounds. No wheezing or rales.  Chest:     Chest wall: No tenderness.  Abdominal:     General: Bowel sounds are normal.     Palpations: Abdomen is soft.     Tenderness: There is abdominal tenderness in the right upper quadrant, epigastric area and left upper quadrant. There is no guarding or rebound.  Musculoskeletal:        General: Normal range of motion.     Cervical back: Normal range of motion and neck supple.  Lymphadenopathy:     Cervical: No cervical adenopathy.  Skin:    General: Skin is warm and dry.     Findings: No rash.  Neurological:     Mental Status: She is alert and oriented to person, place, and time.     ED Results / Procedures /  Treatments   Labs (all labs ordered are listed, but only abnormal results are displayed) Labs Reviewed  COMPREHENSIVE METABOLIC PANEL - Abnormal; Notable for the following components:      Result Value   Glucose, Bld 107 (*)    ALT 48 (*)    Alkaline Phosphatase 137 (*)    All other components within normal limits  URINALYSIS, ROUTINE W REFLEX MICROSCOPIC - Abnormal; Notable for the following components:   Hgb urine dipstick LARGE (*)    RBC / HPF >50 (*)    Bacteria, UA RARE (*)  All other components within normal limits  LIPASE, BLOOD  CBC  I-STAT BETA HCG BLOOD, ED ([facility], [facility], [facility] ONLY)    EKG None  Radiology No results found.  Procedures Procedures (including critical care time)  Medications Ordered in ED Medications  sodium chloride flush (NS) 0.9 % injection 3 mL (3 mLs Intravenous Given 01/19/20 0925)  HYDROmorphone (DILAUDID) injection 1 mg (1 mg Intravenous Given 01/19/20 0925)  diphenhydrAMINE (BENADRYL) injection 25 mg (25 mg Intravenous Given 01/19/20 1041)  HYDROmorphone (DILAUDID) injection 1 mg (1 mg Intravenous Given 01/19/20 1041)    ED Course  I have reviewed the triage vital signs and the nursing notes.  Pertinent labs & imaging results that were available during my care of the patient were reviewed by me and considered in my medical decision making (see chart for details).    MDM Rules/Calculators/A&P                          Patient is a 39 year old female who presents with abdominal pain.  She has a history of chronic abdominal pain.  She has no associated nausea vomiting or fevers.  She has tenderness across her upper abdomen.  She says it feels similar to her prior episodes.  She actually has not been seen frequently in the last several years.  Her last visit was in February but prior to that she had not been seen recently.  She has not had any recent imaging.  Her labs today are nonconcerning.  She was given treatment for her pain and itching related  to the opioid.  She was feeling better but still having some pain.  I had a long discussion with her regarding imaging including CT scan.  She does not want to have this done at this point.  She feels her pain is similar to her prior episodes.  She did have some improvement in pain while she was in the ED but still said she was having some.  I again had discussion about doing a CT scan but she does not feel like she needs at this point.  I did discuss with her the fact that she had had some persistent hematuria, microscopic.  There is no signs of infection or symptoms of infection.  I noticed on her prior urinalysis as well.  I advised her to follow-up with her PCP regarding this.  She acknowledged.  She was discharged home in good condition.  Return precautions were given.  She was advised to make a follow-up appointment with her PCP. Final Clinical Impression(s) / ED Diagnoses Final diagnoses:  Pain of upper abdomen    Rx / DC Orders ED Discharge Orders    None       Malvin Johns, MD 01/19/20 1353

## 2020-01-19 NOTE — ED Notes (Signed)
Patient given discharge instructions. Questions were answered. Patient verbalized understanding of discharge instructions and care at home.  

## 2020-01-19 NOTE — ED Triage Notes (Signed)
Pt states that she thinks she is having a pancreatitis flare up, n/v for to past several days, denies ETOH use.

## 2020-01-24 ENCOUNTER — Encounter (HOSPITAL_COMMUNITY): Payer: Self-pay | Admitting: Emergency Medicine

## 2020-01-24 ENCOUNTER — Other Ambulatory Visit: Payer: Self-pay

## 2020-01-24 ENCOUNTER — Emergency Department (HOSPITAL_COMMUNITY)
Admission: EM | Admit: 2020-01-24 | Discharge: 2020-01-24 | Disposition: A | Discharge status: Home / Self Care | Payer: Medicaid Other | Attending: Emergency Medicine | Admitting: Emergency Medicine

## 2020-01-24 DIAGNOSIS — R109 Unspecified abdominal pain: Secondary | ICD-10-CM | POA: Insufficient documentation

## 2020-01-24 DIAGNOSIS — Z5321 Procedure and treatment not carried out due to patient leaving prior to being seen by health care provider: Secondary | ICD-10-CM | POA: Diagnosis not present

## 2020-01-24 DIAGNOSIS — R1110 Vomiting, unspecified: Secondary | ICD-10-CM | POA: Diagnosis not present

## 2020-01-24 LAB — COMPREHENSIVE METABOLIC PANEL
ALT: 81 U/L — ABNORMAL HIGH (ref 0–44)
AST: 55 U/L — ABNORMAL HIGH (ref 15–41)
Albumin: 3.7 g/dL (ref 3.5–5.0)
Alkaline Phosphatase: 119 U/L (ref 38–126)
Anion gap: 8 (ref 5–15)
BUN: 16 mg/dL (ref 6–20)
CO2: 24 mmol/L (ref 22–32)
Calcium: 8.6 mg/dL — ABNORMAL LOW (ref 8.9–10.3)
Chloride: 109 mmol/L (ref 98–111)
Creatinine, Ser: 1.09 mg/dL — ABNORMAL HIGH (ref 0.44–1.00)
GFR calc Af Amer: >60 mL/min (ref >60)
GFR calc non Af Amer: >60 mL/min (ref >60)
Glucose, Bld: 106 mg/dL — ABNORMAL HIGH (ref 70–99)
Potassium: 4.1 mmol/L (ref 3.5–5.1)
Sodium: 141 mmol/L (ref 135–145)
Total Bilirubin: 0.6 mg/dL (ref 0.3–1.2)
Total Protein: 6.6 g/dL (ref 6.5–8.1)

## 2020-01-24 LAB — LIPASE, BLOOD: Lipase: 27 U/L (ref 11–51)

## 2020-01-24 LAB — URINALYSIS, ROUTINE W REFLEX MICROSCOPIC
Bacteria, UA: NONE SEEN
Bilirubin Urine: NEGATIVE
Glucose, UA: NEGATIVE mg/dL
Ketones, ur: NEGATIVE mg/dL
Leukocytes,Ua: NEGATIVE
Nitrite: NEGATIVE
Protein, ur: 30 mg/dL — AB
RBC / HPF: >50 RBC/hpf — ABNORMAL HIGH (ref 0–5)
Specific Gravity, Urine: 1.031 — ABNORMAL HIGH (ref 1.005–1.030)
pH: 5 (ref 5.0–8.0)

## 2020-01-24 LAB — CBC
HCT: 42 % (ref 36.0–46.0)
Hemoglobin: 13.9 g/dL (ref 12.0–15.0)
MCH: 29.3 pg (ref 26.0–34.0)
MCHC: 33.1 g/dL (ref 30.0–36.0)
MCV: 88.4 fL (ref 80.0–100.0)
Platelets: 234 10*3/uL (ref 150–400)
RBC: 4.75 MIL/uL (ref 3.87–5.11)
RDW: 11.9 % (ref 11.5–15.5)
WBC: 8.4 10*3/uL (ref 4.0–10.5)
nRBC: 0 % (ref 0.0–0.2)

## 2020-01-24 LAB — I-STAT BETA HCG BLOOD, ED (MC, WL, AP ONLY): I-stat hCG, quantitative: <5 m[IU]/mL (ref <5)

## 2020-01-24 MED ORDER — SODIUM CHLORIDE 0.9% FLUSH: 3.0000 mL | Freq: Once | INTRAVENOUS | Status: DC

## 2020-01-24 NOTE — ED Notes (Signed)
Called for Pt several times with No answer

## 2020-01-24 NOTE — ED Notes (Signed)
Pt refused vitals 

## 2020-01-24 NOTE — ED Triage Notes (Signed)
Patient reports mid abdominal pain with emesis this week , no fever or diarrhea , patient suspects pancreatitis flare up , denies fever or chills .

## 2023-10-12 ENCOUNTER — Other Ambulatory Visit: Payer: Self-pay | Admitting: Family

## 2023-10-12 DIAGNOSIS — Z1231 Encounter for screening mammogram for malignant neoplasm of breast: Secondary | ICD-10-CM

## 2023-11-07 ENCOUNTER — Ambulatory Visit

## 2023-11-14 ENCOUNTER — Ambulatory Visit

## 2023-12-30 ENCOUNTER — Ambulatory Visit
